# Patient Record
Sex: Female | Born: 1950 | Race: White | Hispanic: No | Marital: Married | Smoking: Former smoker
Health system: Southern US, Community
[De-identification: ages and names within clinical notes are randomized; demographics above are authoritative.]

## PROBLEM LIST (undated history)

## (undated) DIAGNOSIS — T4145XA Adverse effect of unspecified anesthetic, initial encounter: Secondary | ICD-10-CM

## (undated) DIAGNOSIS — M1990 Unspecified osteoarthritis, unspecified site: Secondary | ICD-10-CM

## (undated) DIAGNOSIS — G629 Polyneuropathy, unspecified: Secondary | ICD-10-CM

## (undated) DIAGNOSIS — N289 Disorder of kidney and ureter, unspecified: Secondary | ICD-10-CM

## (undated) DIAGNOSIS — K219 Gastro-esophageal reflux disease without esophagitis: Secondary | ICD-10-CM

## (undated) DIAGNOSIS — E039 Hypothyroidism, unspecified: Secondary | ICD-10-CM

## (undated) DIAGNOSIS — I10 Essential (primary) hypertension: Secondary | ICD-10-CM

## (undated) DIAGNOSIS — I509 Heart failure, unspecified: Secondary | ICD-10-CM

## (undated) DIAGNOSIS — G4730 Sleep apnea, unspecified: Secondary | ICD-10-CM

## (undated) DIAGNOSIS — K7460 Unspecified cirrhosis of liver: Secondary | ICD-10-CM

## (undated) DIAGNOSIS — D649 Anemia, unspecified: Secondary | ICD-10-CM

## (undated) DIAGNOSIS — K769 Liver disease, unspecified: Secondary | ICD-10-CM

## (undated) DIAGNOSIS — A0472 Enterocolitis due to Clostridium difficile, not specified as recurrent: Secondary | ICD-10-CM

## (undated) DIAGNOSIS — T8859XA Other complications of anesthesia, initial encounter: Secondary | ICD-10-CM

## (undated) DIAGNOSIS — Z87442 Personal history of urinary calculi: Secondary | ICD-10-CM

## (undated) DIAGNOSIS — R112 Nausea with vomiting, unspecified: Secondary | ICD-10-CM

## (undated) DIAGNOSIS — E7800 Pure hypercholesterolemia, unspecified: Secondary | ICD-10-CM

## (undated) DIAGNOSIS — C801 Malignant (primary) neoplasm, unspecified: Secondary | ICD-10-CM

## (undated) DIAGNOSIS — K589 Irritable bowel syndrome without diarrhea: Secondary | ICD-10-CM

## (undated) DIAGNOSIS — E119 Type 2 diabetes mellitus without complications: Secondary | ICD-10-CM

## (undated) DIAGNOSIS — Z9889 Other specified postprocedural states: Secondary | ICD-10-CM

## (undated) DIAGNOSIS — J449 Chronic obstructive pulmonary disease, unspecified: Secondary | ICD-10-CM

## (undated) DIAGNOSIS — E079 Disorder of thyroid, unspecified: Secondary | ICD-10-CM

## (undated) HISTORY — PX: CARPAL TUNNEL RELEASE: SHX101

## (undated) HISTORY — PX: CAROTID ARTERY - SUBCLAVIAN ARTERY BYPASS GRAFT: SUR178

## (undated) HISTORY — PX: ABDOMINAL HYSTERECTOMY: SHX81

## (undated) HISTORY — DX: Unspecified cirrhosis of liver: K74.60

## (undated) HISTORY — DX: Enterocolitis due to Clostridium difficile, not specified as recurrent: A04.72

## (undated) HISTORY — PX: OTHER SURGICAL HISTORY: SHX169

## (undated) HISTORY — DX: Gastro-esophageal reflux disease without esophagitis: K21.9

## (undated) HISTORY — DX: Disorder of thyroid, unspecified: E07.9

## (undated) HISTORY — PX: CHOLECYSTECTOMY: SHX55

## (undated) HISTORY — DX: Adverse effect of unspecified anesthetic, initial encounter: T41.45XA

## (undated) HISTORY — DX: Liver disease, unspecified: K76.9

## (undated) HISTORY — DX: Malignant (primary) neoplasm, unspecified: C80.1

## (undated) HISTORY — DX: Other complications of anesthesia, initial encounter: T88.59XA

## (undated) HISTORY — DX: Disorder of kidney and ureter, unspecified: N28.9

## (undated) HISTORY — PX: CATARACT EXTRACTION: SUR2

## (undated) HISTORY — PX: APPENDECTOMY: SHX54

## (undated) SURGERY — EGD (ESOPHAGOGASTRODUODENOSCOPY)
Anesthesia: Moderate Sedation

---

## 2014-02-15 ENCOUNTER — Encounter (HOSPITAL_COMMUNITY): Payer: Self-pay | Admitting: Emergency Medicine

## 2014-02-15 ENCOUNTER — Inpatient Hospital Stay (HOSPITAL_COMMUNITY)
Admission: EM | Admit: 2014-02-15 | Discharge: 2014-02-20 | DRG: 871 | Disposition: A | Discharge status: Home / Self Care | Payer: BC Managed Care – PPO | Attending: Family Medicine | Admitting: Family Medicine

## 2014-02-15 ENCOUNTER — Emergency Department (HOSPITAL_COMMUNITY): Payer: BC Managed Care – PPO

## 2014-02-15 DIAGNOSIS — K7460 Unspecified cirrhosis of liver: Secondary | ICD-10-CM

## 2014-02-15 DIAGNOSIS — D509 Iron deficiency anemia, unspecified: Secondary | ICD-10-CM | POA: Diagnosis present

## 2014-02-15 DIAGNOSIS — I10 Essential (primary) hypertension: Secondary | ICD-10-CM | POA: Diagnosis present

## 2014-02-15 DIAGNOSIS — K219 Gastro-esophageal reflux disease without esophagitis: Secondary | ICD-10-CM | POA: Diagnosis present

## 2014-02-15 DIAGNOSIS — I129 Hypertensive chronic kidney disease with stage 1 through stage 4 chronic kidney disease, or unspecified chronic kidney disease: Secondary | ICD-10-CM | POA: Diagnosis present

## 2014-02-15 DIAGNOSIS — E785 Hyperlipidemia, unspecified: Secondary | ICD-10-CM | POA: Diagnosis present

## 2014-02-15 DIAGNOSIS — D696 Thrombocytopenia, unspecified: Secondary | ICD-10-CM

## 2014-02-15 DIAGNOSIS — E7800 Pure hypercholesterolemia, unspecified: Secondary | ICD-10-CM | POA: Diagnosis present

## 2014-02-15 DIAGNOSIS — N189 Chronic kidney disease, unspecified: Secondary | ICD-10-CM | POA: Diagnosis present

## 2014-02-15 DIAGNOSIS — D6959 Other secondary thrombocytopenia: Secondary | ICD-10-CM | POA: Diagnosis present

## 2014-02-15 DIAGNOSIS — N390 Urinary tract infection, site not specified: Secondary | ICD-10-CM

## 2014-02-15 DIAGNOSIS — N184 Chronic kidney disease, stage 4 (severe): Secondary | ICD-10-CM | POA: Diagnosis present

## 2014-02-15 DIAGNOSIS — K317 Polyp of stomach and duodenum: Secondary | ICD-10-CM

## 2014-02-15 DIAGNOSIS — A419 Sepsis, unspecified organism: Principal | ICD-10-CM | POA: Diagnosis present

## 2014-02-15 DIAGNOSIS — A4152 Sepsis due to Pseudomonas: Secondary | ICD-10-CM

## 2014-02-15 DIAGNOSIS — K589 Irritable bowel syndrome without diarrhea: Secondary | ICD-10-CM | POA: Diagnosis present

## 2014-02-15 DIAGNOSIS — Z794 Long term (current) use of insulin: Secondary | ICD-10-CM

## 2014-02-15 DIAGNOSIS — E119 Type 2 diabetes mellitus without complications: Secondary | ICD-10-CM

## 2014-02-15 DIAGNOSIS — K649 Unspecified hemorrhoids: Secondary | ICD-10-CM | POA: Diagnosis present

## 2014-02-15 DIAGNOSIS — D732 Chronic congestive splenomegaly: Secondary | ICD-10-CM

## 2014-02-15 DIAGNOSIS — I509 Heart failure, unspecified: Secondary | ICD-10-CM | POA: Diagnosis present

## 2014-02-15 DIAGNOSIS — D649 Anemia, unspecified: Secondary | ICD-10-CM | POA: Diagnosis present

## 2014-02-15 DIAGNOSIS — D131 Benign neoplasm of stomach: Secondary | ICD-10-CM | POA: Diagnosis present

## 2014-02-15 DIAGNOSIS — R6521 Severe sepsis with septic shock: Secondary | ICD-10-CM

## 2014-02-15 DIAGNOSIS — Z87891 Personal history of nicotine dependence: Secondary | ICD-10-CM

## 2014-02-15 DIAGNOSIS — I8500 Esophageal varices without bleeding: Secondary | ICD-10-CM

## 2014-02-15 DIAGNOSIS — N183 Chronic kidney disease, stage 3 unspecified: Secondary | ICD-10-CM | POA: Diagnosis present

## 2014-02-15 DIAGNOSIS — Z6838 Body mass index (BMI) 38.0-38.9, adult: Secondary | ICD-10-CM

## 2014-02-15 DIAGNOSIS — R509 Fever, unspecified: Secondary | ICD-10-CM

## 2014-02-15 DIAGNOSIS — I959 Hypotension, unspecified: Secondary | ICD-10-CM

## 2014-02-15 DIAGNOSIS — I8510 Secondary esophageal varices without bleeding: Secondary | ICD-10-CM | POA: Diagnosis present

## 2014-02-15 DIAGNOSIS — R339 Retention of urine, unspecified: Secondary | ICD-10-CM | POA: Diagnosis present

## 2014-02-15 DIAGNOSIS — J4489 Other specified chronic obstructive pulmonary disease: Secondary | ICD-10-CM | POA: Diagnosis present

## 2014-02-15 DIAGNOSIS — N12 Tubulo-interstitial nephritis, not specified as acute or chronic: Secondary | ICD-10-CM | POA: Diagnosis present

## 2014-02-15 DIAGNOSIS — R6520 Severe sepsis without septic shock: Secondary | ICD-10-CM

## 2014-02-15 DIAGNOSIS — K621 Rectal polyp: Secondary | ICD-10-CM

## 2014-02-15 DIAGNOSIS — E1122 Type 2 diabetes mellitus with diabetic chronic kidney disease: Secondary | ICD-10-CM | POA: Diagnosis present

## 2014-02-15 DIAGNOSIS — K620 Anal polyp: Secondary | ICD-10-CM | POA: Diagnosis present

## 2014-02-15 DIAGNOSIS — J449 Chronic obstructive pulmonary disease, unspecified: Secondary | ICD-10-CM | POA: Diagnosis present

## 2014-02-15 DIAGNOSIS — G2581 Restless legs syndrome: Secondary | ICD-10-CM | POA: Diagnosis present

## 2014-02-15 HISTORY — DX: Heart failure, unspecified: I50.9

## 2014-02-15 HISTORY — DX: Irritable bowel syndrome, unspecified: K58.9

## 2014-02-15 HISTORY — DX: Essential (primary) hypertension: I10

## 2014-02-15 HISTORY — DX: Gastro-esophageal reflux disease without esophagitis: K21.9

## 2014-02-15 HISTORY — DX: Pure hypercholesterolemia, unspecified: E78.00

## 2014-02-15 HISTORY — DX: Chronic obstructive pulmonary disease, unspecified: J44.9

## 2014-02-15 HISTORY — DX: Type 2 diabetes mellitus without complications: E11.9

## 2014-02-15 LAB — URINALYSIS, ROUTINE W REFLEX MICROSCOPIC
BILIRUBIN URINE: NEGATIVE
Glucose, UA: NEGATIVE mg/dL
Ketones, ur: NEGATIVE mg/dL
NITRITE: NEGATIVE
PROTEIN: 100 mg/dL — AB
SPECIFIC GRAVITY, URINE: 1.015 (ref 1.005–1.030)
Urobilinogen, UA: 0.2 mg/dL (ref 0.0–1.0)
pH: 6 (ref 5.0–8.0)

## 2014-02-15 LAB — BASIC METABOLIC PANEL
BUN: 25 mg/dL — AB (ref 6–23)
CALCIUM: 8.9 mg/dL (ref 8.4–10.5)
CO2: 27 mEq/L (ref 19–32)
CREATININE: 1.49 mg/dL — AB (ref 0.50–1.10)
Chloride: 92 mEq/L — ABNORMAL LOW (ref 96–112)
GFR, EST AFRICAN AMERICAN: 42 mL/min — AB (ref >90)
GFR, EST NON AFRICAN AMERICAN: 36 mL/min — AB (ref >90)
GLUCOSE: 201 mg/dL — AB (ref 70–99)
Potassium: 4.5 mEq/L (ref 3.7–5.3)
Sodium: 134 mEq/L — ABNORMAL LOW (ref 137–147)

## 2014-02-15 LAB — CBC WITH DIFFERENTIAL/PLATELET
BASOS ABS: 0 10*3/uL (ref 0.0–0.1)
Basophils Relative: 0 % (ref 0–1)
EOS ABS: 0 10*3/uL (ref 0.0–0.7)
Eosinophils Relative: 0 % (ref 0–5)
HCT: 24.4 % — ABNORMAL LOW (ref 36.0–46.0)
Hemoglobin: 7.1 g/dL — ABNORMAL LOW (ref 12.0–15.0)
LYMPHS PCT: 2 % — AB (ref 12–46)
Lymphs Abs: 0.3 10*3/uL — ABNORMAL LOW (ref 0.7–4.0)
MCH: 20 pg — AB (ref 26.0–34.0)
MCHC: 29.1 g/dL — AB (ref 30.0–36.0)
MCV: 68.7 fL — ABNORMAL LOW (ref 78.0–100.0)
Monocytes Absolute: 0.8 10*3/uL (ref 0.1–1.0)
Monocytes Relative: 6 % (ref 3–12)
NEUTROS PCT: 92 % — AB (ref 43–77)
Neutro Abs: 12.9 10*3/uL — ABNORMAL HIGH (ref 1.7–7.7)
PLATELETS: 118 10*3/uL — AB (ref 150–400)
RBC: 3.55 MIL/uL — ABNORMAL LOW (ref 3.87–5.11)
RDW: 18.2 % — AB (ref 11.5–15.5)
WBC: 14 10*3/uL — ABNORMAL HIGH (ref 4.0–10.5)

## 2014-02-15 LAB — TROPONIN I: Troponin I: <0.3 ng/mL (ref <0.30)

## 2014-02-15 LAB — RETICULOCYTES
RBC.: 3.53 MIL/uL — AB (ref 3.87–5.11)
RETIC COUNT ABSOLUTE: 109.4 10*3/uL (ref 19.0–186.0)
Retic Ct Pct: 3.1 % (ref 0.4–3.1)

## 2014-02-15 LAB — URINE MICROSCOPIC-ADD ON

## 2014-02-15 LAB — PREPARE RBC (CROSSMATCH)

## 2014-02-15 LAB — ABO/RH: ABO/RH(D): O POS

## 2014-02-15 LAB — PRO B NATRIURETIC PEPTIDE: Pro B Natriuretic peptide (BNP): 277.5 pg/mL — ABNORMAL HIGH (ref 0–125)

## 2014-02-15 LAB — GLUCOSE, CAPILLARY: Glucose-Capillary: 210 mg/dL — ABNORMAL HIGH (ref 70–99)

## 2014-02-15 LAB — LACTIC ACID, PLASMA: Lactic Acid, Venous: 3.9 mmol/L — ABNORMAL HIGH (ref 0.5–2.2)

## 2014-02-15 LAB — MRSA PCR SCREENING: MRSA BY PCR: NEGATIVE

## 2014-02-15 MED ORDER — INSULIN ASPART 100 UNIT/ML ~~LOC~~ SOLN
0.0000 [IU] | Freq: Three times a day (TID) | SUBCUTANEOUS | Status: DC
  Administered 2014-02-16 (×2): 7 [IU] via SUBCUTANEOUS
  Administered 2014-02-16: 11 [IU] via SUBCUTANEOUS
  Administered 2014-02-17: 3 [IU] via SUBCUTANEOUS
  Administered 2014-02-17: 7 [IU] via SUBCUTANEOUS
  Administered 2014-02-17 – 2014-02-18 (×3): 3 [IU] via SUBCUTANEOUS
  Administered 2014-02-18: 4 [IU] via SUBCUTANEOUS

## 2014-02-15 MED ORDER — DEXTROSE 5 % IV SOLN
INTRAVENOUS | Status: AC
  Filled 2014-02-15: qty 10

## 2014-02-15 MED ORDER — INSULIN GLARGINE 100 UNIT/ML ~~LOC~~ SOLN
90.0000 [IU] | Freq: Every day | SUBCUTANEOUS | Status: DC
  Administered 2014-02-15 – 2014-02-19 (×5): 90 [IU] via SUBCUTANEOUS
  Filled 2014-02-15 (×5): qty 0.9

## 2014-02-15 MED ORDER — SODIUM CHLORIDE 0.9 % IV SOLN: INTRAVENOUS | Status: AC

## 2014-02-15 MED ORDER — DEXTROSE 5 % IV SOLN
1.0000 g | Freq: Once | INTRAVENOUS | Status: AC
  Administered 2014-02-15: 1 g via INTRAVENOUS
  Filled 2014-02-15: qty 10

## 2014-02-15 MED ORDER — HEPARIN SODIUM (PORCINE) 5000 UNIT/ML IJ SOLN
5000.0000 [IU] | Freq: Three times a day (TID) | INTRAMUSCULAR | Status: DC
  Administered 2014-02-15 – 2014-02-18 (×8): 5000 [IU] via SUBCUTANEOUS
  Filled 2014-02-15 (×8): qty 1

## 2014-02-15 MED ORDER — ONDANSETRON HCL 4 MG PO TABS: 4.0000 mg | ORAL_TABLET | Freq: Four times a day (QID) | ORAL | Status: DC | PRN

## 2014-02-15 MED ORDER — DEXTROSE 5 % IV SOLN
1.0000 g | INTRAVENOUS | Status: DC
  Filled 2014-02-15 (×3): qty 10

## 2014-02-15 MED ORDER — SODIUM CHLORIDE 0.9 % IV SOLN
INTRAVENOUS | Status: DC
  Administered 2014-02-15 – 2014-02-16 (×2): 1000 mL via INTRAVENOUS

## 2014-02-15 MED ORDER — DEXTROSE 5 % IV SOLN
1.0000 g | INTRAVENOUS | Status: DC
  Filled 2014-02-15: qty 10

## 2014-02-15 MED ORDER — SODIUM CHLORIDE 0.9 % IV BOLUS (SEPSIS)
1000.0000 mL | Freq: Once | INTRAVENOUS | Status: AC
  Administered 2014-02-15: 1000 mL via INTRAVENOUS

## 2014-02-15 MED ORDER — SODIUM CHLORIDE 0.9 % IV BOLUS (SEPSIS)
500.0000 mL | Freq: Once | INTRAVENOUS | Status: AC
  Administered 2014-02-15: 500 mL via INTRAVENOUS

## 2014-02-15 MED ORDER — ONDANSETRON HCL 4 MG/2ML IJ SOLN
4.0000 mg | Freq: Four times a day (QID) | INTRAMUSCULAR | Status: DC | PRN
  Administered 2014-02-19: 4 mg via INTRAVENOUS
  Filled 2014-02-15: qty 2

## 2014-02-15 MED ORDER — SODIUM CHLORIDE 0.9 % IV SOLN
INTRAVENOUS | Status: DC
  Administered 2014-02-15: 18:00:00 via INTRAVENOUS

## 2014-02-15 MED ORDER — INSULIN ASPART 100 UNIT/ML ~~LOC~~ SOLN
0.0000 [IU] | Freq: Every day | SUBCUTANEOUS | Status: DC
  Administered 2014-02-15 – 2014-02-17 (×2): 2 [IU] via SUBCUTANEOUS

## 2014-02-15 NOTE — ED Notes (Signed)
Penny Martinez, daughter, [phone] (can be reached anytime)

## 2014-02-15 NOTE — ED Provider Notes (Signed)
CSN: [id number]     Arrival date & time 02/15/14  61 History   First MD Initiated Contact with Patient 02/15/14 1713     Chief Complaint  Patient presents with  . Fever      HPI Pt was seen at 1745.  Per EMS and pt report, c/o gradual onset and persistence of constant generalized weakness/fatigue, subjective home fevers/chills, cough, SOB, and suprapubic and lower back discomfort with urination for the past 2 weeks, worse over the past several days. Pt was evaluated by her PMD PTA, then sent to the ED for further evaluation of fever and "low blood pressure." EMS gave APAP en route to the ED. Pt denies CP/palpitations, no abd pain, no N/V/D, no flank pain, no rash.    Past Medical History  Diagnosis Date  . Diabetes mellitus without complication   . COPD (chronic obstructive pulmonary disease)   . Hypertension   . Hypercholesteremia   . Acid reflux   . IBS (irritable bowel syndrome)   . CHF (congestive heart failure)    Past Surgical History  Procedure Laterality Date  . 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 Abdominal hysterectomy    . Cholecystectomy    . Appendectomy      History  Substance Use Topics  . Smoking status: Former Research scientist (life sciences)  . Smokeless tobacco: Not on file  . Alcohol Use: No    Review of Systems ROS: Statement: All systems negative except as marked or noted in the HPI; Constitutional: +fever and chills, generalized body weakness/fatigue. ; ; Eyes: Negative for eye pain, redness and discharge. ; ; ENMT: Negative for ear pain, hoarseness, nasal congestion, sinus pressure and sore throat. ; ; Cardiovascular: Negative for chest pain, palpitations, diaphoresis, and peripheral edema. ; ; Respiratory: +cough, SOB. Negative for wheezing and stridor. ; ; Gastrointestinal: Negative for nausea, vomiting, diarrhea, abdominal pain, blood in stool, hematemesis, jaundice and rectal bleeding. . ; ; Genitourinary: +dysuria. Negative for flank pain and hematuria. ; ;  Musculoskeletal: Negative for neck pain. Negative for swelling and trauma.; ; Skin: Negative for pruritus, rash, abrasions, blisters, bruising and skin lesion.; ; Neuro: Negative for headache, lightheadedness and neck stiffness. Negative for altered level of consciousness , altered mental status, extremity weakness, paresthesias, involuntary movement, seizure and syncope.      Allergies  Erythromycin; Penicillins; and Sulfa antibiotics  Home Medications   Prior to Admission medications   Not on File   BP 82/50  Pulse 105  Temp(Src) 103 F (39.4 C) (Oral)  Resp 16  Ht 5' 1"  (1.549 m)  Wt 200 lb (90.719 kg)  BMI 37.81 kg/m2  SpO2 95% Filed Vitals:   02/15/14 1830 02/15/14 1834 02/15/14 1847 02/15/14 1900  BP: 87/52  98/44 91/55  Pulse: 101  100 100  Temp:  101.7 F (38.7 C)    TempSrc:  Oral    Resp: 16  22 23   Height:      Weight:      SpO2: 94%  94% 95%    Physical Exam 1750: Physical examination:  Nursing notes reviewed; Vital signs and O2 SAT reviewed; +febrile.;; Constitutional: Well developed, Well nourished, In no acute distress; Head:  Normocephalic, atraumatic; Eyes: EOMI, PERRL, No scleral icterus; ENMT: Mouth and pharynx normal, Mucous membranes dry; Neck: Supple, Full range of motion, No lymphadenopathy. No meningeal signs.; Cardiovascular: Tachycardic rate and rhythm, No gallop; Respiratory: Breath sounds coarse & equal bilaterally, No wheezes.  Speaking full sentences with ease,  Normal respiratory effort/excursion; Chest: Nontender, Movement normal; Abdomen: Soft, Nontender, Nondistended, Normal bowel sounds; Genitourinary: No CVA tenderness; Extremities: Pulses normal, No tenderness, No edema, No calf edema or asymmetry.; Neuro: AA&Ox3, Major CN grossly intact.  Speech clear. No gross focal motor or sensory deficits in extremities.; Skin: Color normal, Warm, Dry.   ED Course  Procedures     EKG Interpretation   Date/Time:  Thursday February 15 2014 17:44:31  EDT Ventricular Rate:  106 PR Interval:  141 QRS Duration: 134 QT Interval:  354 QTC Calculation: 470 R Axis:   -112 Text Interpretation:  Sinus tachycardia Left axis deviation Right bundle  branch block Probable lateral infarct, old Baseline wander No old tracing  to compare Confirmed by [facility]  MD, Nunzio Cory [phone]) on 02/15/2014  5:58:58 PM      MDM  MDM Reviewed: nursing note and vitals Interpretation: labs, ECG and x-ray Total time providing critical care: 30-74 minutes. This excludes time spent performing separately reportable procedures and services. Consults: admitting MD    CRITICAL CARE Performed by: Alfonzo Feller Total critical care time: 40 Critical care time was exclusive of separately billable procedures and treating other patients. Critical care was necessary to treat or prevent imminent or life-threatening deterioration. Critical care was time spent personally by me on the following activities: development of treatment plan with patient and/or surrogate as well as nursing, discussions with consultants, evaluation of patient's response to treatment, examination of patient, obtaining history from patient or surrogate, ordering and performing treatments and interventions, ordering and review of laboratory studies, ordering and review of radiographic studies, pulse oximetry and re-evaluation of patient's condition.   Results for orders placed during the hospital encounter of 02/15/14  URINALYSIS, ROUTINE W REFLEX MICROSCOPIC      Result Value Ref Range   Color, Urine YELLOW  YELLOW   APPearance CLOUDY (*) CLEAR   Specific Gravity, Urine 1.015  1.005 - 1.030   pH 6.0  5.0 - 8.0   Glucose, UA NEGATIVE  NEGATIVE mg/dL   Hgb urine dipstick MODERATE (*) NEGATIVE   Bilirubin Urine NEGATIVE  NEGATIVE   Ketones, ur NEGATIVE  NEGATIVE mg/dL   Protein, ur 100 (*) NEGATIVE mg/dL   Urobilinogen, UA 0.2  0.0 - 1.0 mg/dL   Nitrite NEGATIVE  NEGATIVE   Leukocytes, UA  SMALL (*) NEGATIVE  BASIC METABOLIC PANEL      Result Value Ref Range   Sodium 134 (*) 137 - 147 mEq/L   Potassium 4.5  3.7 - 5.3 mEq/L   Chloride 92 (*) 96 - 112 mEq/L   CO2 27  19 - 32 mEq/L   Glucose, Bld 201 (*) 70 - 99 mg/dL   BUN 25 (*) 6 - 23 mg/dL   Creatinine, Ser 1.49 (*) 0.50 - 1.10 mg/dL   Calcium 8.9  8.4 - 10.5 mg/dL   GFR calc non Af Amer 36 (*) >90 mL/min   GFR calc Af Amer 42 (*) >90 mL/min  CBC WITH DIFFERENTIAL      Result Value Ref Range   WBC 14.0 (*) 4.0 - 10.5 K/uL   RBC 3.55 (*) 3.87 - 5.11 MIL/uL   Hemoglobin 7.1 (*) 12.0 - 15.0 g/dL   HCT 24.4 (*) 36.0 - 46.0 %   MCV 68.7 (*) 78.0 - 100.0 fL   MCH 20.0 (*) 26.0 - 34.0 pg   MCHC 29.1 (*) 30.0 - 36.0 g/dL   RDW 18.2 (*) 11.5 - 15.5 %   Platelets 118 (*) 150 -  400 K/uL   Neutrophils Relative % 92 (*) 43 - 77 %   Lymphocytes Relative 2 (*) 12 - 46 %   Monocytes Relative 6  3 - 12 %   Eosinophils Relative 0  0 - 5 %   Basophils Relative 0  0 - 1 %   Neutro Abs 12.9 (*) 1.7 - 7.7 K/uL   Lymphs Abs 0.3 (*) 0.7 - 4.0 K/uL   Monocytes Absolute 0.8  0.1 - 1.0 K/uL   Eosinophils Absolute 0.0  0.0 - 0.7 K/uL   Basophils Absolute 0.0  0.0 - 0.1 K/uL   RBC Morphology POLYCHROMASIA PRESENT     WBC Morphology WHITE COUNT CONFIRMED ON SMEAR     Smear Review PLATELET COUNT CONFIRMED BY SMEAR    LACTIC ACID, PLASMA      Result Value Ref Range   Lactic Acid, Venous 3.9 (*) 0.5 - 2.2 mmol/L  TROPONIN I      Result Value Ref Range   Troponin I <0.30  <0.30 ng/mL  URINE MICROSCOPIC-ADD ON      Result Value Ref Range   Squamous Epithelial / LPF FEW (*) RARE   WBC, UA 21-50  <3 WBC/hpf   RBC / HPF 3-6  <3 RBC/hpf   Bacteria, UA MANY (*) RARE   Dg Chest Port 1 View 02/15/2014   CLINICAL DATA:  Fever. Weakness. Vomiting. Diarrhea. Chest discomfort.  EXAM: PORTABLE CHEST - 1 VIEW  COMPARISON:  None.  FINDINGS: The cardiopericardial silhouette is enlarged. Pulmonary vascular congestion is present with cephalization of  pulmonary blood flow. Basilar atelectasis. Evaluation of the bases is suboptimal due to body habitus and underpenetration from portable technique. No pleural effusions. No gross consolidation.  IMPRESSION: Cardiomegaly and pulmonary vascular congestion. No focal consolidation.   Electronically Signed   By: Dereck Ligas M.D.   On: 02/15/2014 17:45    2000:  SBP dropped to 78 on arrival. IVF bolus given with SBP increasing to 90. SBP again dropping to 80's; 2nd IV saline lock placed and 2nd IVF bolus ordered with SBP increasing to 98. Fever and tachycardia improving after APAP. Mentating per her baseline. +UTI, UC and BC x2 pending; will dose IV rocephin. Dx and testing d/w pt and family.  Questions answered.  Verb understanding, agreeable to admit.   T/C to [facility] Dr. Anastasio Champion, case discussed, including:  HPI, pertinent PM/SHx, VS/PE, dx testing, ED course and treatment:  Agreeable to admit, requests to write temporary orders, obtain stepdown bed.    Alfonzo Feller, DO 02/16/14 1827

## 2014-02-15 NOTE — H&P (Addendum)
[facility] History and Physical  Penny Martinez XYV:[id number] DOB: 1950/10/01 DOA: 02/15/2014  Referring physician: ER. PCP: No primary provider on file.   Chief Complaint: Fever, fatigue.  HPI: Penny Martinez is a 63 y.o. female  This is a 63 year old lady who has diabetes who presents with fever and fatigue for the last 2 weeks. She has had lower back pain and suprapubic discomfort also. She has been nauseous. She also complains of some dyspnea. She does have a previous history of congestive heart failure. In the emergency room she was found to be hypotensive and initial evaluation shows urinalysis consistent with UTI. She also has abnormal blood work with a microcytic anemia. She is now being admitted for further investigation and management.   Review of Systems:  Constitutional:  No weight loss HEENT:  No headaches, Difficulty swallowing,Tooth/dental problems,Sore throat,  No sneezing, itching, ear ache, nasal congestion, post nasal drip,  Cardio-vascular:  No chest pain, Orthopnea, PND, swelling in lower extremities, anasarca, dizziness, palpitations   Resp:   No excess mucus, no productive cough, No non-productive cough, No coughing up of blood.No change in color of mucus.No wheezing.No chest wall deformity  Skin:  no rash or lesions.  GU:  no dysuria, change in color of urine, no urgency or frequency. No flank pain.  Musculoskeletal:  No joint pain or swelling. No decreased range of motion. No back pain.  Psych:  No change in mood or affect. No depression or anxiety. No memory loss.   Past Medical History  Diagnosis Date  . Diabetes mellitus without complication   . COPD (chronic obstructive pulmonary disease)   . Hypertension   . Hypercholesteremia   . Acid reflux   . IBS (irritable bowel syndrome)   . CHF (congestive heart failure)    Past Surgical History  Procedure Laterality Date  . 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 Abdominal  hysterectomy    . Cholecystectomy    . Appendectomy     Social History:  reports that she has quit smoking. She does not have any smokeless tobacco history on file. She reports that she does not drink alcohol or use illicit drugs.  Allergies  Allergen Reactions  . Erythromycin   . Penicillins   . Sulfa Antibiotics     No family history on file.   Prior to Admission medications   Medication Sig Start Date End Date Taking? Authorizing Provider  insulin aspart (NOVOLOG) 100 UNIT/ML injection Inject 30-43 Units into the skin 3 (three) times daily before meals.   Yes Historical Provider, MD  insulin glargine (LANTUS) 100 UNIT/ML injection Inject 90 Units into the skin at bedtime.   Yes Historical Provider, MD  Liraglutide (VICTOZA) 18 MG/3ML SOPN Inject into the skin.   Yes Historical Provider, MD   Physical Exam: Filed Vitals:   02/15/14 2030  BP: 84/44  Pulse: 99  Temp:   Resp: 10    BP 84/44  Pulse 99  Temp(Src) 100.3 F (37.9 C) (Oral)  Resp 10  Ht 5' 1"  (1.549 m)  Wt 90.719 kg (200 lb)  BMI 37.81 kg/m2  SpO2 92%  General:  Appears calm and comfortable. She does not look toxic or septic but she is hypotensive. Eyes: PERRL, normal lids, irises & conjunctiva ENT: grossly normal hearing, lips & tongue Neck: no LAD, masses or thyromegaly Cardiovascular: RRR, no m/r/g. No LE edema. Telemetry: SR, no arrhythmias  Respiratory: CTA bilaterally, no w/r/r. Normal respiratory effort. Abdomen: soft, ntnd.  There is a suggestion of a mass in the right upper quadrant but I'm not quite certain of this. Skin: no rash or induration seen on limited exam Musculoskeletal: grossly normal tone BUE/BLE Psychiatric: grossly normal mood and affect, speech fluent and appropriate Neurologic: grossly non-focal.          Labs on Admission:  Basic Metabolic Panel:  Recent Labs Lab 02/15/14 1725  NA 134*  K 4.5  CL 92*  CO2 27  GLUCOSE 201*  BUN 25*  CREATININE 1.49*  CALCIUM 8.9         CBC:  Recent Labs Lab 02/15/14 1725  WBC 14.0*  NEUTROABS 12.9*  HGB 7.1*  HCT 24.4*  MCV 68.7*  PLT 118*   Cardiac Enzymes:  Recent Labs Lab 02/15/14 1725  TROPONINI <0.30    BNP (last 3 results)  Recent Labs  02/15/14 1726  PROBNP 277.5*   CBG: No results found for this basename: GLUCAP,  in the last 168 hours  Radiological Exams on Admission: Dg Chest Port 1 View  02/15/2014   CLINICAL DATA:  Fever. Weakness. Vomiting. Diarrhea. Chest discomfort.  EXAM: PORTABLE CHEST - 1 VIEW  COMPARISON:  None.  FINDINGS: The cardiopericardial silhouette is enlarged. Pulmonary vascular congestion is present with cephalization of pulmonary blood flow. Basilar atelectasis. Evaluation of the bases is suboptimal due to body habitus and underpenetration from portable technique. No pleural effusions. No gross consolidation.  IMPRESSION: Cardiomegaly and pulmonary vascular congestion. No focal consolidation.   Electronically Signed   By: Dereck Ligas M.D.   On: 02/15/2014 17:45    EKG: Independently reviewed. Sinus tachycardia. Left axis deviation, right bundle branch block.  Assessment/Plan   1. Sepsis, likely secondary to UTI. 2. Hypotension secondary to #1. 3. Microcytic anemia, unclear etiology. She most likely has iron deficiency anemia, raising the possibility of occult GI blood loss. 4. Chronic kidney disease, likely secondary to a combination of hypertension and diabetes. 5. History of congestive heart failure. She is currently compensated. 6. Type 2 diabetes mellitus. 7. Obesity.  Plan: 1. Admit to step down unit. 2. Intravenous fluids and fluid bolus as required. Watch for the development of fluid overload and congestive heart failure in view of the history. 3. Intravenous antibiotics. 4. Anemia panel. 5. Give 2 units blood transfusion for symptomatic relief. 6. Gastroenterology consultation in view of the possible iron deficiency anemia and GI blood  loss. 7. Control diabetes. A. CT scan of the abdomen for further evaluation of possible mass in the right upper quadrant/fullness.  Further recommendations will depend on patient's hospital progress.   Code Status: Full code.  Family Communication: I discussed the plan with the patient at the bedside.   Disposition Plan: Home when medically stable.  Time spent: 60 minutes.  Doree Albee [facility] Pager [phone].  **Disclaimer: This note may have been dictated with voice recognition software. Similar sounding words can inadvertently be transcribed and this note may contain transcription errors which may not have been corrected upon publication of note.**

## 2014-02-15 NOTE — ED Notes (Signed)
Per EMS - cough x 2 wks, fever starting last night with increased fatigue and weakness.  EMS gave tylenol 1075m in route.  Also c/o bil lower back pain and sob with activity.

## 2014-02-16 ENCOUNTER — Observation Stay (HOSPITAL_COMMUNITY): Payer: BC Managed Care – PPO

## 2014-02-16 LAB — CBC
HCT: 27.5 % — ABNORMAL LOW (ref 36.0–46.0)
HEMOGLOBIN: 8.4 g/dL — AB (ref 12.0–15.0)
MCH: 22.2 pg — ABNORMAL LOW (ref 26.0–34.0)
MCHC: 30.5 g/dL (ref 30.0–36.0)
MCV: 72.8 fL — ABNORMAL LOW (ref 78.0–100.0)
PLATELETS: 106 10*3/uL — AB (ref 150–400)
RBC: 3.78 MIL/uL — ABNORMAL LOW (ref 3.87–5.11)
RDW: 20.8 % — ABNORMAL HIGH (ref 11.5–15.5)
WBC: 16.3 10*3/uL — ABNORMAL HIGH (ref 4.0–10.5)

## 2014-02-16 LAB — COMPREHENSIVE METABOLIC PANEL
ALT: 15 U/L (ref 0–35)
AST: 28 U/L (ref 0–37)
Albumin: 2.9 g/dL — ABNORMAL LOW (ref 3.5–5.2)
Alkaline Phosphatase: 90 U/L (ref 39–117)
BUN: 32 mg/dL — AB (ref 6–23)
CALCIUM: 8.2 mg/dL — AB (ref 8.4–10.5)
CO2: 25 mEq/L (ref 19–32)
Chloride: 93 mEq/L — ABNORMAL LOW (ref 96–112)
Creatinine, Ser: 1.62 mg/dL — ABNORMAL HIGH (ref 0.50–1.10)
GFR, EST AFRICAN AMERICAN: 38 mL/min — AB (ref >90)
GFR, EST NON AFRICAN AMERICAN: 33 mL/min — AB (ref >90)
GLUCOSE: 232 mg/dL — AB (ref 70–99)
Potassium: 4.1 mEq/L (ref 3.7–5.3)
Sodium: 132 mEq/L — ABNORMAL LOW (ref 137–147)
Total Bilirubin: 0.9 mg/dL (ref 0.3–1.2)
Total Protein: 6.3 g/dL (ref 6.0–8.3)

## 2014-02-16 LAB — IRON AND TIBC
Iron: 19 ug/dL — ABNORMAL LOW (ref 42–135)
Saturation Ratios: 5 % — ABNORMAL LOW (ref 20–55)
TIBC: 410 ug/dL (ref 250–470)
UIBC: 391 ug/dL (ref 125–400)

## 2014-02-16 LAB — GLUCOSE, CAPILLARY
GLUCOSE-CAPILLARY: 226 mg/dL — AB (ref 70–99)
GLUCOSE-CAPILLARY: 210 mg/dL — AB (ref 70–99)
Glucose-Capillary: 257 mg/dL — ABNORMAL HIGH (ref 70–99)
Glucose-Capillary: 186 mg/dL — ABNORMAL HIGH (ref 70–99)

## 2014-02-16 LAB — FERRITIN: Ferritin: 4 ng/mL — ABNORMAL LOW (ref 10–291)

## 2014-02-16 LAB — HEMOGLOBIN A1C
HEMOGLOBIN A1C: 7.9 % — AB (ref <5.7)
Mean Plasma Glucose: 180 mg/dL — ABNORMAL HIGH (ref <117)

## 2014-02-16 LAB — VITAMIN B12: VITAMIN B 12: 468 pg/mL (ref 211–911)

## 2014-02-16 LAB — TSH: TSH: 1.27 u[IU]/mL (ref 0.350–4.500)

## 2014-02-16 LAB — FOLATE

## 2014-02-16 MED ORDER — IOHEXOL 300 MG/ML  SOLN
50.0000 mL | Freq: Once | INTRAMUSCULAR | Status: AC | PRN
  Administered 2014-02-16: 50 mL via ORAL

## 2014-02-16 MED ORDER — KETOROLAC TROMETHAMINE 15 MG/ML IJ SOLN
15.0000 mg | Freq: Once | INTRAMUSCULAR | Status: AC
  Administered 2014-02-16: 15 mg via INTRAVENOUS
  Filled 2014-02-16: qty 1

## 2014-02-16 MED ORDER — GABAPENTIN 300 MG PO CAPS
300.0000 mg | ORAL_CAPSULE | Freq: Three times a day (TID) | ORAL | Status: DC
  Administered 2014-02-16 – 2014-02-20 (×10): 300 mg via ORAL
  Filled 2014-02-16 (×10): qty 1

## 2014-02-16 MED ORDER — DM-GUAIFENESIN ER 30-600 MG PO TB12: 1.0000 | ORAL_TABLET | Freq: Two times a day (BID) | ORAL | Status: DC | PRN

## 2014-02-16 MED ORDER — SODIUM CHLORIDE 0.9 % IJ SOLN
INTRAMUSCULAR | Status: AC
  Administered 2014-02-16: 09:00:00
  Filled 2014-02-16: qty 500

## 2014-02-16 MED ORDER — GUAIFENESIN-DM 100-10 MG/5ML PO SYRP
5.0000 mL | ORAL_SOLUTION | ORAL | Status: DC | PRN
  Administered 2014-02-16 – 2014-02-19 (×5): 5 mL via ORAL
  Filled 2014-02-16 (×5): qty 5

## 2014-02-16 MED ORDER — IOHEXOL 300 MG/ML  SOLN
80.0000 mL | Freq: Once | INTRAMUSCULAR | Status: AC | PRN
  Administered 2014-02-16: 80 mL via INTRAVENOUS

## 2014-02-16 MED ORDER — DEXTROSE 5 % IV SOLN
2.0000 g | Freq: Two times a day (BID) | INTRAVENOUS | Status: DC
  Administered 2014-02-16 – 2014-02-18 (×4): 2 g via INTRAVENOUS
  Filled 2014-02-16 (×7): qty 2

## 2014-02-16 MED ORDER — PANTOPRAZOLE SODIUM 40 MG PO TBEC
40.0000 mg | DELAYED_RELEASE_TABLET | Freq: Every day | ORAL | Status: DC
  Administered 2014-02-16 – 2014-02-20 (×5): 40 mg via ORAL
  Filled 2014-02-16 (×5): qty 1

## 2014-02-16 NOTE — Care Management Note (Addendum)
    Page 1 of 1   02/20/2014     9:44:55 AM CARE MANAGEMENT NOTE 02/20/2014  Patient:  Penny Martinez, Penny Martinez   Account Number:  [id number]  Date Initiated:  02/16/2014  Documentation initiated by:  Theophilus Kinds  Subjective/Objective Assessment:   Pt admitted from home with UTi and sepsis. Pt lives with her husband and will return home at discharge. Pt is independent with ADL's.     Action/Plan:   No CM needs noted.   Anticipated DC Date:  02/19/2014   Anticipated DC Plan:  [place]  CM consult      Choice offered to / List presented to:             Status of service:  Completed, signed off Medicare Important Message given?   (If response is "NO", the following Medicare IM given date fields will be blank) Date Medicare IM given:   Date Additional Medicare IM given:    Discharge Disposition:  HOME/SELF CARE  Per UR Regulation:    If discussed at Long Length of Stay Meetings, dates discussed:    Comments:  02/20/14 0940 Christinia Gully, RN BSN CM Pt discharged home today. No CM needs noted.  02/19/14 [place], RN BSN CM Per pt, PCP is Dr. Alford Highland of [facility]  02/16/14 [place], RN BSN CM

## 2014-02-16 NOTE — Progress Notes (Signed)
ANTIBIOTIC CONSULT NOTE - INITIAL  Pharmacy Consult for Ceftazidime Indication: bactermia  Allergies  Allergen Reactions  . Erythromycin   . Penicillins   . Sulfa Antibiotics     Patient Measurements: Height: 5' 1"  (154.9 cm) Weight: 205 lb 11 oz (93.3 kg) IBW/kg (Calculated) : 47.8 Adjusted Body Weight:   Vital Signs: Temp: 98.9 F (37.2 C) (06/12 1200) Temp src: Oral (06/12 1200) BP: 91/60 mmHg (06/12 1530) Pulse Rate: 77 (06/12 1530) Intake/Output from previous day: 06/11 0701 - 06/12 0700 In: 1961.7 [I.V.:1261.7; Blood:700] Out: 100 [Urine:100] Intake/Output from this shift:    Labs:  Recent Labs  02/15/14 1725 02/16/14 0700  WBC 14.0* 16.3*  HGB 7.1* 8.4*  PLT 118* 106*  CREATININE 1.49* 1.62*   Estimated Creatinine Clearance: 37.5 ml/min (by C-G formula based on Cr of 1.62). No results found for this basename: VANCOTROUGH, Corlis Leak, VANCORANDOM, GENTTROUGH, GENTPEAK, GENTRANDOM, TOBRATROUGH, TOBRAPEAK, TOBRARND, AMIKACINPEAK, AMIKACINTROU, AMIKACIN,  in the last 72 hours   Microbiology: Recent Results (from the past 720 hour(s))  CULTURE, BLOOD (ROUTINE X 2)     Status: None   Collection Time    02/15/14  7:00 PM      Result Value Ref Range Status   Specimen Description BLOOD RIGHT HAND   Final   Special Requests     Final   Value: BOTTLES DRAWN AEROBIC AND ANAEROBIC AEB=12CC ANA=8CC   Culture     Final   Value: GRAM NEGATIVE RODS     Gram Stain Report Called to,Read Back By and Verified With: NADINE ROWE RN ON [id number] AT 0820 BY RESSEGGER R   Report Status PENDING   Incomplete  MRSA PCR SCREENING     Status: None   Collection Time    02/15/14  9:10 PM      Result Value Ref Range Status   MRSA by PCR NEGATIVE  NEGATIVE Final   Comment:            The GeneXpert MRSA Assay (FDA     approved for NASAL specimens     only), is one component of a     comprehensive MRSA colonization     surveillance program. It is not     intended to diagnose MRSA      infection nor to guide or     monitor treatment for     MRSA infections.    Medical History: Past Medical History  Diagnosis Date  . Diabetes mellitus without complication   . COPD (chronic obstructive pulmonary disease)   . Hypertension   . Hypercholesteremia   . Acid reflux   . IBS (irritable bowel syndrome)   . CHF (congestive heart failure)     Medications:  Scheduled:  . sodium chloride   Intravenous STAT  . cefTAZidime (FORTAZ)  IV  2 g Intravenous Q12H  . heparin  5,000 Units Subcutaneous 3 times per day  . insulin aspart  0-20 Units Subcutaneous TID WC  . insulin aspart  0-5 Units Subcutaneous QHS  . insulin glargine  90 Units Subcutaneous QHS   Assessment: Gram Negative noted in blood Change from Ceftriaxone to Ceftazidime until sensitivity results  Goal of Therapy:  Eradicate infection  Plan:  Ceftazidime 2 GM IV every 12 hours Monitor renal function Labs per protocol F./U Cultures and sensitivity  Abner Greenspan, Erle Guster Bennett 02/16/2014,4:02 PM

## 2014-02-16 NOTE — Progress Notes (Signed)
GI consult called to Dr. Laural Golden (night shift placed MD on treatment team). Dr. Laural Golden stated that Dr. Gala Romney was on call today. Dr. Gala Romney paged around 2pm and [place] notified when rounding this evening at 2:30pm, stated that Dr. Gala Romney had several cases this afternoon. Dr. Gala Romney paged again at 3:50pm and added to treatment team.

## 2014-02-16 NOTE — Progress Notes (Signed)
eLink Physician-Brief Progress Note Patient Name: Penny Martinez DOB: September 24, 1950 MRN: [id number]  Date of Service  02/16/2014   HPI/Events of Note   ELINK monitoring BC Noted gram neg in blood Not on pressors  eICU Interventions  Change to ceftaz until ID and sens mnoted If declines, add quinololone   Intervention Category Intermediate Interventions: Infection - evaluation and management  FEINSTEIN,DANIEL J. 02/16/2014, 3:58 PM

## 2014-02-16 NOTE — Progress Notes (Signed)
UR chart review completed.  

## 2014-02-16 NOTE — Progress Notes (Signed)
Bladder scanned due to poor urinary output, 781cc noted on scanner, call to mid level MD for foley due to acute urinary retention.  Verified with 2nd RN of need for foley placement.  79 French catheter placed, patient tolerated well, clear yellow urine return.

## 2014-02-16 NOTE — Progress Notes (Addendum)
Note: This document was prepared with digital dictation and possible smart phrase technology. Any transcriptional errors that result from this process are unintentional.   Penny Martinez BMS:[id number] DOB: 12-20-1950 DOA: 02/15/2014 PCP: No primary provider on file.  Brief narrative: 63 y/o ?, known h/o DM ty 2 diagnosed before age 63 ,? 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 She states that she was well until [facility] when she went with her family to the beach and then started feeling ill. She had fevers and chills and a nonproductive cough and she felt was bronchitis. On 6/11 she felt extremely hot and went to her [address] office and was admitted because of hypotension and fever   She was also found to have microcytic anemia and was transfused 2 U prbc She has not had a history of colonoscopy in the past and has no dark stools or tarry stools. CT scan ordered to rule out abdominal issues pending Blood cultures preliminary grew gram-negative rods  Past medical history-As per Problem list Chart reviewed as below- And  Consultants:  Gastroenterology  Procedures:  None  Antibiotics:  Ceftriaxone 6/11   Subjective  A little less sleepy. Tolerating some diet Felt that her this morning and no head.  Denies Chest pain no nausea vomiting or shortness of breath  No blurred vision   Objective    Interim History:   Telemetry: Sinus rhythm   Objective: Filed Vitals:   02/16/14 0515 02/16/14 0530 02/16/14 0730 02/16/14 0800  BP: 83/48 82/41 91/60  92/55  Pulse: 94 92 86 88  Temp:  99.7 F (37.6 C)  98.5 F (36.9 C)  TempSrc:  Axillary  Oral  Resp: 18 18 12 14   Height:      Weight:      SpO2: 100% 100% 98% 98%    Intake/Output Summary (Last 24 hours) at 02/16/14 0852 Last data filed at 02/16/14  0500  Gross per 24 hour  Intake 1961.67 ml  Output    100 ml  Net 1861.67 ml    Exam:  General: Alert and oriented in no apparent distress Cardiovascular: S1-S2 no murmur rub or gallop Respiratory:  clear no added sounds Abdomen:  soft nontender nondistended no rebound Skin no lower extremity edema Neuro grossly intact  Data Reviewed: Basic Metabolic Panel:  Recent Labs Lab 02/15/14 1725 02/16/14 0700  NA 134* 132*  K 4.5 4.1  CL 92* 93*  CO2 27 25  GLUCOSE 201* 232*  BUN 25* 32*  CREATININE 1.49* 1.62*  CALCIUM 8.9 8.2*   Liver Function Tests:  Recent Labs Lab 02/16/14 0700  AST 28  ALT 15  ALKPHOS 90  BILITOT 0.9  PROT 6.3  ALBUMIN 2.9*   No results found for this basename: LIPASE, AMYLASE,  in the last 168 hours No results found for this basename: AMMONIA,  in the last 168 hours CBC:  Recent Labs Lab 02/15/14 1725 02/16/14 0700  WBC 14.0* 16.3*  NEUTROABS 12.9*  --   HGB 7.1* 8.4*  HCT 24.4* 27.5*  MCV 68.7* 72.8*  PLT 118* 106*   Cardiac Enzymes:  Recent Labs Lab 02/15/14 1725  TROPONINI <0.30   BNP: No components found with this basename: POCBNP,  CBG:  Recent Labs Lab 02/15/14 2120 02/16/14 0734  GLUCAP 210* 226*    Recent Results (from the past 240 hour(s))  CULTURE, BLOOD (  ROUTINE X 2)     Status: None   Collection Time    02/15/14  7:00 PM      Result Value Ref Range Status   Specimen Description BLOOD RIGHT HAND   Final   Special Requests     Final   Value: BOTTLES DRAWN AEROBIC AND ANAEROBIC AEB=12CC ANA=8CC   Culture     Final   Value: GRAM NEGATIVE RODS     Gram Stain Report Called to,Read Back By and Verified With: NADINE ROWE RN ON [id number] AT 0820 BY RESSEGGER R   Report Status PENDING   Incomplete  MRSA PCR SCREENING     Status: None   Collection Time    02/15/14  9:10 PM      Result Value Ref Range Status   MRSA by PCR NEGATIVE  NEGATIVE Final   Comment:            The GeneXpert MRSA Assay (FDA      approved for NASAL specimens     only), is one component of a     comprehensive MRSA colonization     surveillance program. It is not     intended to diagnose MRSA     infection nor to guide or     monitor treatment for     MRSA infections.     Studies:              All Imaging reviewed and is as per above notation   Scheduled Meds: . sodium chloride   Intravenous STAT  . cefTRIAXone (ROCEPHIN)  IV  1 g Intravenous Q24H  . heparin  5,000 Units Subcutaneous 3 times per day  . insulin aspart  0-20 Units Subcutaneous TID WC  . insulin aspart  0-5 Units Subcutaneous QHS  . insulin glargine  90 Units Subcutaneous QHS   Continuous Infusions: . sodium chloride 100 mL/hr at 02/16/14 0500     Assessment/Plan: 1. Sepsis likely secondary to pyelonephritis with gram-negative rods-continue ceftriaxone, continue IV fluids and 100 cc per hour, CBC plus differential in a.m.  Tylenol for fever 2. 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 Worsening could be secondary to sepsis. Monitor 3. Diabetes mellitus type 2-continue 90 and Lantus each bedtime, resistant sliding scale coverage.  Victoza on hold 4. Microcytic anemia-status post 2 units packed cell transfusion-hemoglobin acceptable went from 7.1-->8.4. Patient getting CT scan abdomen pelvis. Gastroenterology consulted for consideration of colonoscopy and I changed her to a clear liquid diet after breakfast this morning. Full anemia panel is pending 5. Congestive heart failure-no echocardiogram recorded in system, monitor. Is not on an ACE inhibitor or beta blocker for unclear reason 6. Thrombocytopenia-unclear etiology. Monitor could be secondary to sepsis 7. ? Mass right upper quadrant-CT scan pending Morbid obesity, Body mass index is 38.88 kg/(m^2). patient will require outpatient counseling regarding the same  Code Status: full Family Communication:  None bedside Disposition Plan:  Inpatient-keep on sdu   Verneita Griffes,  MD  [facility] Pager [phone] 02/16/2014, 8:52 AM    LOS: 1 day

## 2014-02-17 DIAGNOSIS — D696 Thrombocytopenia, unspecified: Secondary | ICD-10-CM

## 2014-02-17 DIAGNOSIS — K7460 Unspecified cirrhosis of liver: Secondary | ICD-10-CM

## 2014-02-17 DIAGNOSIS — D732 Chronic congestive splenomegaly: Secondary | ICD-10-CM

## 2014-02-17 LAB — TYPE AND SCREEN
ABO/RH(D): O POS
Antibody Screen: NEGATIVE
UNIT DIVISION: 0
Unit division: 0

## 2014-02-17 LAB — CBC WITH DIFFERENTIAL/PLATELET
BASOS PCT: 0 % (ref 0–1)
Basophils Absolute: 0 10*3/uL (ref 0.0–0.1)
EOS PCT: 1 % (ref 0–5)
Eosinophils Absolute: 0.1 10*3/uL (ref 0.0–0.7)
HCT: 27.2 % — ABNORMAL LOW (ref 36.0–46.0)
HEMOGLOBIN: 8.4 g/dL — AB (ref 12.0–15.0)
LYMPHS PCT: 7 % — AB (ref 12–46)
Lymphs Abs: 0.6 10*3/uL — ABNORMAL LOW (ref 0.7–4.0)
MCH: 22.3 pg — ABNORMAL LOW (ref 26.0–34.0)
MCHC: 30.9 g/dL (ref 30.0–36.0)
MCV: 72.3 fL — ABNORMAL LOW (ref 78.0–100.0)
MONO ABS: 0.6 10*3/uL (ref 0.1–1.0)
Monocytes Relative: 7 % (ref 3–12)
NEUTROS PCT: 85 % — AB (ref 43–77)
Neutro Abs: 7 10*3/uL (ref 1.7–7.7)
PLATELETS: 75 10*3/uL — AB (ref 150–400)
RBC: 3.76 MIL/uL — AB (ref 3.87–5.11)
RDW: 20.4 % — ABNORMAL HIGH (ref 11.5–15.5)
WBC: 8.3 10*3/uL (ref 4.0–10.5)

## 2014-02-17 LAB — COMPREHENSIVE METABOLIC PANEL
ALK PHOS: 76 U/L (ref 39–117)
ALT: 14 U/L (ref 0–35)
AST: 28 U/L (ref 0–37)
Albumin: 2.7 g/dL — ABNORMAL LOW (ref 3.5–5.2)
BUN: 26 mg/dL — ABNORMAL HIGH (ref 6–23)
CALCIUM: 8 mg/dL — AB (ref 8.4–10.5)
CO2: 25 meq/L (ref 19–32)
Chloride: 95 mEq/L — ABNORMAL LOW (ref 96–112)
Creatinine, Ser: 1.26 mg/dL — ABNORMAL HIGH (ref 0.50–1.10)
GFR, EST AFRICAN AMERICAN: 52 mL/min — AB (ref >90)
GFR, EST NON AFRICAN AMERICAN: 45 mL/min — AB (ref >90)
GLUCOSE: 147 mg/dL — AB (ref 70–99)
POTASSIUM: 4 meq/L (ref 3.7–5.3)
Sodium: 133 mEq/L — ABNORMAL LOW (ref 137–147)
TOTAL PROTEIN: 6.2 g/dL (ref 6.0–8.3)
Total Bilirubin: 0.8 mg/dL (ref 0.3–1.2)

## 2014-02-17 LAB — HEPATITIS PANEL, ACUTE
HCV AB: NEGATIVE
HEP B C IGM: NONREACTIVE
HEP B S AG: NEGATIVE
Hep A IgM: NONREACTIVE

## 2014-02-17 LAB — GLUCOSE, CAPILLARY
GLUCOSE-CAPILLARY: 186 mg/dL — AB (ref 70–99)
GLUCOSE-CAPILLARY: 211 mg/dL — AB (ref 70–99)
Glucose-Capillary: 145 mg/dL — ABNORMAL HIGH (ref 70–99)
Glucose-Capillary: 219 mg/dL — ABNORMAL HIGH (ref 70–99)

## 2014-02-17 LAB — URINE CULTURE

## 2014-02-17 LAB — HIV ANTIBODY (ROUTINE TESTING W REFLEX): HIV 1&2 Ab, 4th Generation: NONREACTIVE

## 2014-02-17 MED ORDER — SODIUM CHLORIDE 0.9 % IV SOLN
INTRAVENOUS | Status: DC
  Administered 2014-02-17: 22:00:00 via INTRAVENOUS

## 2014-02-17 MED ORDER — LEVOTHYROXINE SODIUM 75 MCG PO TABS
75.0000 ug | ORAL_TABLET | Freq: Every day | ORAL | Status: DC
  Administered 2014-02-18 – 2014-02-20 (×3): 75 ug via ORAL
  Filled 2014-02-17 (×3): qty 1

## 2014-02-17 MED ORDER — ZOLPIDEM TARTRATE 5 MG PO TABS
5.0000 mg | ORAL_TABLET | Freq: Every evening | ORAL | Status: DC | PRN
  Administered 2014-02-17 – 2014-02-19 (×3): 5 mg via ORAL
  Filled 2014-02-17 (×3): qty 1

## 2014-02-17 NOTE — Progress Notes (Signed)
Pt's foley removed per MD order. Pt has voided since foley has been removed. We were unable to measure output as pt removed hat from toilet. I reeducated importance of monitoring output and replaced hat in toilet. Will continue to monitor.

## 2014-02-17 NOTE — Progress Notes (Signed)
Referring Provider: No ref. provider found Primary Care Physician:  No primary provider on file. Primary Gastroenterologist:  Dr. Gala Romney  Reason for Consultation:  Microcytic anemia  HPI:  Penny Martinez 63 year old lady admitted to the hospital with urosepsis/septic shock. Found to have significant microcytic anemia and thrombocytopenia.  No evidence of GI bleeding, however. She was fluid resuscitated and started on broad-spectrum antibiotics.  Initial hemoglobin and hematocrit 7.1 and 68.7 platelets 118,000 white count 14,000; iron saturation 5% ; TIBC 410, serum iron 19.  Hospitalization thus far has been on remarkable. No evidence GI bleeding. No Hemoccults documented.  Significant urinary retention requiring Foley catheter. CT scan for right upper quadrant fullness revealed a nodular liver along with splenomegaly and early portal venous hypertension consistent with underlying cirrhosis which is apparently a new finding. Patient denies any prior GI evaluation other than a barium enema many, many years ago. No prior EGD or colonoscopy.  ALT, AST, alkaline phosphatase and total bilirubin repeatedly normal this hospitalization.  No prior liver issues and no family history of liver disease.  No high risk behaviors Patient is essentially devoid of any GI symptoms including nausea vomiting, GERD, odynophagia, dysphagia, early satiety, weight loss, abdominal pain or change in bowel function. Specifically denies melena or hematochezia.   Past Medical History  Diagnosis Date  . Diabetes mellitus without complication   . COPD (chronic obstructive pulmonary disease)   . Hypertension   . Hypercholesteremia   . Acid reflux   . IBS (irritable bowel syndrome)   . CHF (congestive heart failure)     Past Surgical History  Procedure Laterality Date  . 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 Abdominal hysterectomy    . Cholecystectomy    . Appendectomy      Prior to Admission  medications   Medication Sig Start Date End Date Taking? Authorizing Provider  benazepril (LOTENSIN) 20 MG tablet Take 20 mg by mouth daily.   Yes Historical Provider, MD  Canagliflozin-Metformin HCl (INVOKAMET) 50-1000 MG TABS Take 1 tablet by mouth 2 (two) times daily.   Yes Historical Provider, MD  furosemide (LASIX) 80 MG tablet Take 80 mg by mouth daily.   Yes Historical Provider, MD  gabapentin (NEURONTIN) 300 MG capsule Take 300 mg by mouth 3 (three) times daily.   Yes Historical Provider, MD  insulin aspart (NOVOLOG) 100 UNIT/ML injection Inject 15 Units into the skin 3 (three) times daily before meals.    Yes Historical Provider, MD  insulin glargine (LANTUS) 100 UNIT/ML injection Inject 90 Units into the skin at bedtime.   Yes Historical Provider, MD  lansoprazole (PREVACID) 30 MG capsule Take 30 mg by mouth daily.   Yes Historical Provider, MD  levothyroxine (SYNTHROID, LEVOTHROID) 75 MCG tablet Take 75 mcg by mouth daily before breakfast.   Yes Historical Provider, MD  Liraglutide (VICTOZA) 18 MG/3ML SOPN Inject 1.8 mg into the skin daily.    Yes Historical Provider, MD  potassium chloride (K-DUR) 10 MEQ tablet Take 10 mEq by mouth daily.   Yes Historical Provider, MD    Current Facility-Administered Medications  Medication Dose Route Frequency Provider Last Rate Last Dose  . 0.9 %  sodium chloride infusion   Intravenous Continuous Nimish C Gosrani, MD 100 mL/hr at 02/17/14 0600    . cefTAZidime (FORTAZ) 2 g in dextrose 5 % 50 mL IVPB  2 g Intravenous Q12H Raylene Miyamoto, MD   2 g at 02/17/14 0900  . gabapentin (NEURONTIN)  capsule 300 mg  300 mg Oral TID Dianne Dun, NP   300 mg at 02/17/14 0900  . guaiFENesin-dextromethorphan (ROBITUSSIN DM) 100-10 MG/5ML syrup 5 mL  5 mL Oral Q4H PRN Dianne Dun, NP   5 mL at 02/16/14 2151  . heparin injection 5,000 Units  5,000 Units Subcutaneous 3 times per day Doree Albee, MD   5,000 Units at 02/17/14 0541  .  insulin aspart (novoLOG) injection 0-20 Units  0-20 Units Subcutaneous TID WC Doree Albee, MD   3 Units at 02/17/14 0755  . insulin aspart (novoLOG) injection 0-5 Units  0-5 Units Subcutaneous QHS Doree Albee, MD   2 Units at 02/15/14 2201  . insulin glargine (LANTUS) injection 90 Units  90 Units Subcutaneous QHS Doree Albee, MD   90 Units at 02/16/14 2233  . ondansetron (ZOFRAN) tablet 4 mg  4 mg Oral Q6H PRN Nimish C Gosrani, MD       Or  . ondansetron (ZOFRAN) injection 4 mg  4 mg Intravenous Q6H PRN Nimish C Gosrani, MD      . pantoprazole (PROTONIX) EC tablet 40 mg  40 mg Oral Daily Dianne Dun, NP   40 mg at 02/17/14 0900    Allergies as of 02/15/2014 - Review Complete 02/15/2014  Allergen Reaction Noted  . Erythromycin  02/15/2014  . Penicillins  02/15/2014  . Sulfa antibiotics  02/15/2014    History reviewed. No pertinent family history.  History   Social History  . Marital Status: Married    Spouse Name: N/A    Number of Children: N/A  . Years of Education: N/A   Occupational History  . Not on file.   Social History Main Topics  . Smoking status: Former Research scientist (life sciences)  . Smokeless tobacco: Former Systems developer  . Alcohol Use: No  . Drug Use: No  . Sexual Activity: Not Currently   Other Topics Concern  . Not on file   Social History Narrative  . No narrative on file    Review of Systems: Gen: Denies weight loss, and sleep disorder CV: Denies chest pain, angina, palpitations, syncope, orthopnea, PND, peripheral edema, and claudication. GI: Denies vomiting blood, jaundice, and fecal incontinence.   Denies dysphagia or odynophagia. Derm: Denies rash, itching, dry skin, hives, moles, warts, or unhealing ulcers.  Psych: Denies depression, anxiety, memory loss, suicidal ideation, hallucinations, paranoia, and confusion. Heme: Denies bruising, bleeding, and enlarged lymph nodes.   Physical Exam: Vital signs in last 24 hours: Temp:  [98.1 F (36.7  C)-99.4 F (37.4 C)] 99.4 F (37.4 C) (06/13 0800) Pulse Rate:  [66-86] 81 (06/13 0630) Resp:  [12-22] 16 (06/13 0630) BP: (90-138)/(27-62) 119/58 mmHg (06/13 0630) SpO2:  [87 %-100 %] 94 % (06/13 0630) Last BM Date: 02/15/14 General:   Alert, conversant and appears to be in no acute distress. Accompanied by her husband in the ICU room. Head:  Normocephalic and atraumatic. Mouth:  No deformity or lesions, dentition normal. Neck:  Supple; no masses or thyromegaly. Lungs:  Clear throughout to auscultation.   No wheezes, crackles, or rhonchi. No acute distress. Heart:  Regular rate and rhythm; no murmurs, clicks, rubs,  or gallops. Abdomen: Obese.   Positive bowel sounds. Soft and nontender. Liver edge appreciated at 4 fingerbreadths below the right costal margin margin. Spleen is ballotable. Msk:  Symmetrical without gross deformities. Normal posture. Pulses:  Normal pulses noted. Extremities:  Without clubbing or edema. Neurologic:  Alert and  oriented x4;  grossly normal neurologically. Skin:  Intact without significant lesions or rashes.  Intake/Output from previous day: 06/12 0701 - 06/13 0700 In: 2550 [I.V.:2500; IV Piggyback:50] Out: 1600 [Urine:1600] Intake/Output this shift: Total I/O In: -  Out: 825 [Urine:825]  Lab Results:  Recent Labs  02/15/14 1725 02/16/14 0700 02/17/14 0534  WBC 14.0* 16.3* 8.3  HGB 7.1* 8.4* 8.4*  HCT 24.4* 27.5* 27.2*  PLT 118* 106* 75*   BMET  Recent Labs  02/15/14 1725 02/16/14 0700 02/17/14 0534  NA 134* 132* 133*  K 4.5 4.1 4.0  CL 92* 93* 95*  CO2 27 25 25   GLUCOSE 201* 232* 147*  BUN 25* 32* 26*  CREATININE 1.49* 1.62* 1.26*  CALCIUM 8.9 8.2* 8.0*   LFT  Recent Labs  02/17/14 0534  PROT 6.2  ALBUMIN 2.7*  AST 28  ALT 14  ALKPHOS 76  BILITOT 0.8  Studies/Results: Ct Abdomen Pelvis W Contrast  02/16/2014   CLINICAL DATA:  Fever, right upper quadrant pain, diarrhea, nausea  EXAM: CT ABDOMEN AND PELVIS WITH  CONTRAST  TECHNIQUE: Multidetector CT imaging of the abdomen and pelvis was performed using the standard protocol following bolus administration of intravenous contrast.  CONTRAST:  28m OMNIPAQUE IOHEXOL 300 MG/ML SOLN, 869mOMNIPAQUE IOHEXOL 300 MG/ML SOLN  COMPARISON:  None.  FINDINGS: Lung bases are unremarkable.  Mild posterior basilar atelectasis.  Sagittal images of the spine shows multilevel degenerative changes thoracolumbar spine. Extensive atherosclerotic calcifications of abdominal aorta and iliac arteries. No aortic aneurysm. There is subtle nodular contour of the liver. Clinical correlation is necessary to exclude cirrhosis.  Splenomegaly is noted.  The spleen measures 16 cm in length.  Portal vein measures 1.4 cm in diameter.  Atherosclerotic calcifications of coronary arteries. No aortic aneurysm.  There is a low-density nodular thickening of left adrenal gland measures 1.8 cm. This is suspicious for adrenal adenoma.  The kidneys show symmetrical enhancement. Nonspecific mild right perinephric and proximal right periureteral stranding.  Delayed renal images shows persistent parenchymal enhancement with delay excretion of contrast material. Findings are suspicious for renal failure/insufficiency or medical renal disease. Clinical correlation is necessary. No retroperitoneal adenopathy. No mesenteric fluid collection. Small hiatal hernia.  Distended urinary bladder is noted. There is mild skin thickening and stranding subcutaneous fat in anterior pelvic wall. This may be due to subcutaneous injections or mild cellulitis. Clinical correlation is necessary. There is no abdominal or pelvic ascites. No small bowel obstruction.  No pericecal inflammation. The patient is status post appendectomy. Status postcholecystectomy. Status post hysterectomy. No inguinal adenopathy. No destructive bony lesions are noted within pelvis.  IMPRESSION: 1. There is subtle micronodular contour of the liver. Clinical  correlation is necessary to exclude early cirrhosis. Splenomegaly is noted. The spleen measures 16 cm in length. Portal vein measures 1.4 cm in diameter suspicious for portal hypertension. 2. There is bilateral renal delay excretion with persistent parenchymal enhancement on delayed images suspicious for renal insufficiency or medical renal disease. Clinical correlation is necessary. Nonspecific mild right perinephric and right proximal periureteral stranding. No calcified ureteral calculi are noted. 3. No small bowel or colonic obstruction. 4. Degenerative changes thoracolumbar spine. 5. Status post hysterectomy, status post appendectomy. Status post cholecystectomy. 6. Extensive atherosclerotic calcifications abdominal aorta and iliac arteries. 7. Moderate distended urinary bladder. No calcified calculi are noted within urinary bladder. 8. There is mild skin thickening and subcutaneous stranding anterior pelvic wall. This may be due to subcutaneous injections or mild cellulitis. Clinical correlation is necessary. These results  were called by telephone at the time of interpretation on 02/16/2014 at 10:24 AM to Dr. Hurshel Party , who verbally acknowledged these results.   Electronically Signed   By: Lahoma Crocker M.D.   On: 02/16/2014 10:25   Dg Chest Port 1 View  02/15/2014   CLINICAL DATA:  Fever. Weakness. Vomiting. Diarrhea. Chest discomfort.  EXAM: PORTABLE CHEST - 1 VIEW  COMPARISON:  None.  FINDINGS: The cardiopericardial silhouette is enlarged. Pulmonary vascular congestion is present with cephalization of pulmonary blood flow. Basilar atelectasis. Evaluation of the bases is suboptimal due to body habitus and underpenetration from portable technique. No pleural effusions. No gross consolidation.  IMPRESSION: Cardiomegaly and pulmonary vascular congestion. No focal consolidation.   Electronically Signed   By: Dereck Ligas M.D.   On: 02/15/2014 17:45   Impression:  Penny Martinez obese 64 year old lady with  diabetes admitted to the  hospital with urinary retention, urosepsis and profound microcytic anemia. No evidence of GI bleeding at this time. CT suggestive of well-established cirrhosis. No prior EGD or colonoscopy. Thrombocytopenia likely secondary to splenic sequestration secondary to cirrhosis. Etiology of cirrhosis more likely secondary to NASH in the setting of obesity and relatively poorly controlled diabetes. Need to screen for chronic viral hepatitis. I doubt autoimmune liver disease or other relatively rare disorders such as a Wilson's disease, alpha-1 antitrypsin deficiency, etc.  Given the patient is being treated in the ICU for urosepsis and there is no evidence of GI bleeding, urgent endoscopic evaluation is not warranted at this time.  Recommendations:    Follow hemoglobin. Watch for signs of GI bleeding particularly with thrombocytopenia and ongoing treatment with heparin for DVT prophylaxis.   Agree with PPI empirically for now.  As long as Hemoglobin remains in the 8 Range, would not plan to transfuse. Eventually, patient needs to have a diagnostic colonoscopy as well as an EGD at least, in part, to screen for esophageal varices. Needs to be screened for chronic hepatitis B. and C as well.  Also, immune status needs to be determined regarding hepatitis A and B. She may need to be vaccinated. Hepatoma screening via ultrasound every 6 months also needed. Thanks for consult.  Will follow with you.     Notice:  This dictation was prepared with Dragon dictation along with smaller phrase technology. Any transcriptional errors that result from this process are unintentional and may not be corrected upon review.

## 2014-02-17 NOTE — Progress Notes (Signed)
Pt transferred to floor from ICU. Pt is in no acute distress and VS are stable. Pt is alert and oriented, will continue to monitor.

## 2014-02-17 NOTE — Progress Notes (Signed)
Pt a/o.vss. Up in the recliner. No complaints of any distress. Report called to [facility]. Pt to be transferred to room 303.

## 2014-02-17 NOTE — Progress Notes (Addendum)
Note: This document was prepared with digital dictation and possible smart phrase technology. Any transcriptional errors that result from this process are unintentional.   Penny Martinez QQP:[id number] DOB: 05/18/1951 DOA: 02/15/2014 PCP: No primary provider on file.  Brief narrative: 63 y/o ?, known h/o DM ty 2 diagnosed before age 63 ,? 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 She states that she was well until [facility] Day when she went with her family to the beach and then started feeling ill. She had fevers and chills and a nonproductive cough and she felt was bronchitis. On 6/11 she felt extremely hot and went to her 62 office and was admitted because of hypotension and fever   She was also found to have microcytic anemia and was transfused 2 U prbc She has not had a history of colonoscopy in the past and has no dark stools or tarry stools.  Blood cultures preliminary grew gram-negative rods CT scan showed cirrhosis and Spelnomegally  Past medical history-As per Problem list Chart reviewed as below- And  Consultants:  Gastroenterology  Procedures:  None  Antibiotics:  Ceftriaxone 6/11-6/12  Ceftazadime 6/12   Subjective   Looks much better.  No n/v/cp Mild Lumbar back pain which isn't new No blurred vision No stool reported and ?no dark stool   Objective    Interim History:   Telemetry: Sinus rhythm   Objective: Filed Vitals:   02/17/14 0530 02/17/14 0600 02/17/14 0630 02/17/14 0800  BP: 118/57 138/61 119/58   Pulse: 76 81 81   Temp:    99.4 F (37.4 C)  TempSrc:    Oral  Resp: 16 13 16    Height:      Weight:      SpO2: 99% 92% 94%     Intake/Output Summary (Last 24 hours) at 02/17/14 0914 Last data filed at 02/17/14 0800  Gross per 24 hour  Intake   2550 ml  Output   2425  ml  Net    125 ml    Exam:  General: Alert and oriented in no apparent distress Cardiovascular: S1-S2 no murmur rub or gallop Respiratory:  clear no added sounds Abdomen:  soft nontender nondistended no rebound Skin no lower extremity edema Neuro grossly intact  Data Reviewed: Basic Metabolic Panel:  Recent Labs Lab 02/15/14 1725 02/16/14 0700 02/17/14 0534  NA 134* 132* 133*  K 4.5 4.1 4.0  CL 92* 93* 95*  CO2 27 25 25   GLUCOSE 201* 232* 147*  BUN 25* 32* 26*  CREATININE 1.49* 1.62* 1.26*  CALCIUM 8.9 8.2* 8.0*   Liver Function Tests:  Recent Labs Lab 02/16/14 0700 02/17/14 0534  AST 28 28  ALT 15 14  ALKPHOS 90 76  BILITOT 0.9 0.8  PROT 6.3 6.2  ALBUMIN 2.9* 2.7*   No results found for this basename: LIPASE, AMYLASE,  in the last 168 hours No results found for this basename: AMMONIA,  in the last 168 hours CBC:  Recent Labs Lab 02/15/14 1725 02/16/14 0700 02/17/14 0534  WBC 14.0* 16.3* 8.3  NEUTROABS 12.9*  --  7.0  HGB 7.1* 8.4* 8.4*  HCT 24.4* 27.5* 27.2*  MCV 68.7* 72.8* 72.3*  PLT 118* 106* 75*   Cardiac Enzymes:  Recent Labs Lab 02/15/14 1725  TROPONINI <0.30   BNP: No components found with this basename: POCBNP,  CBG:  Recent Labs  Lab 02/16/14 0734 02/16/14 1059 02/16/14 1639 02/16/14 2148 02/17/14 0736  GLUCAP 226* 257* 210* 186* 145*    Recent Results (from the past 240 hour(s))  URINE CULTURE     Status: None   Collection Time    02/15/14  6:09 PM      Result Value Ref Range Status   Specimen Description URINE, CLEAN CATCH   Final   Special Requests NONE   Final   Culture  Setup Time     Final   Value: 02/15/2014 23:19     Performed at [facility]     Final   Value: >=100,000 COLONIES/ML     Performed at [facility]   Culture     Final   Value: ESCHERICHIA COLI     Performed at [facility]   Report Status PENDING   Incomplete  CULTURE, BLOOD (ROUTINE X 2)     Status:  None   Collection Time    02/15/14  6:54 PM      Result Value Ref Range Status   Specimen Description BLOOD RIGHT HAND   Final   Special Requests BOTTLES DRAWN AEROBIC AND ANAEROBIC 12CC   Final   Culture NO GROWTH 2 DAYS   Final   Report Status PENDING   Incomplete  CULTURE, BLOOD (ROUTINE X 2)     Status: None   Collection Time    02/15/14  7:00 PM      Result Value Ref Range Status   Specimen Description BLOOD RIGHT HAND   Final   Special Requests     Final   Value: BOTTLES DRAWN AEROBIC AND ANAEROBIC AEB=12CC ANA=8CC   Culture     Final   Value: GRAM NEGATIVE RODS     Gram Stain Report Called to,Read Back By and Verified With: NADINE ROWE RN ON [id number] AT 0820 BY RESSEGGER R   Report Status PENDING   Incomplete  MRSA PCR SCREENING     Status: None   Collection Time    02/15/14  9:10 PM      Result Value Ref Range Status   MRSA by PCR NEGATIVE  NEGATIVE Final   Comment:            The GeneXpert MRSA Assay (FDA     approved for NASAL specimens     only), is one component of a     comprehensive MRSA colonization     surveillance program. It is not     intended to diagnose MRSA     infection nor to guide or     monitor treatment for     MRSA infections.     Studies:              All Imaging reviewed and is as per above notation   Scheduled Meds: . cefTAZidime (FORTAZ)  IV  2 g Intravenous Q12H  . gabapentin  300 mg Oral TID  . heparin  5,000 Units Subcutaneous 3 times per day  . insulin aspart  0-20 Units Subcutaneous TID WC  . insulin aspart  0-5 Units Subcutaneous QHS  . insulin glargine  90 Units Subcutaneous QHS  . pantoprazole  40 mg Oral Daily   Continuous Infusions: . sodium chloride 100 mL/hr at 02/17/14 0600     Assessment/Plan:  1. Sepsis likely secondary to pyelonephritis with gram-negative rods/E.coli-continue ceftazidime q 12-await culture and sensitivities. continue IV fluids and 100 cc per hour, CBC plus differential  in a.m.  Tylenol for  fever 2. 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 Is resolving slowly 3. Cirrhosis as well as splenomegally-differential diagnosis broad but includes potential fatty liver leading to cirrhosis because of habitus, hepatitis [patient had a transfusion in 1984], less likely but still possible for splenomegaly or HIV, and tumor. I will get acute hepatitis panel, platelets smear given thrombocytopenia, hemoglobin electrophoresis-if initial labs do not reveal anything, could potentially be autoimmune hepatitis, metabolic disease--we will hold off on those labs to workup until preliminary results are available. Obtain INR 4. Diabetes mellitus type 2-continue 90 units Lantus each bedtime, resistant sliding scale coverage.  Victoza on hold-blood sugars controlled between 140-180's-Victoza and Canagliflozin-metformin on hold and he for now. 5. Microcytic anemia-status post 2 units packed cell transfusion-hemoglobin acceptable went from 7.1-->8.4.  Gastroenterology consulted. Likely iron deficiency anemia based on anemia panel done 02/15/14, however this is not completely clear and electrophoresis is pending 6. Congestive heart failure-no echocardiogram recorded in system, monitor. Is not on an ACE inhibitor or beta blocker for unclear reason-hold lasix for now as was hypotensive on andssion 7. Thrombocytopenia-unclear etiology. Monitor could be secondary to sepsis 8. Morbid obesity, Body mass index is 38.88 kg/(m^2). patient will require outpatient counseling regarding the same  Code Status: full Family Communication:  None bedside Disposition Plan:  Inpatient-transfer to MedSurg as hemodynamically stable   Verneita Griffes, MD  [facility] Pager [phone] 02/17/2014, 9:14 AM    LOS: 2 days

## 2014-02-18 LAB — COMPREHENSIVE METABOLIC PANEL
ALBUMIN: 2.7 g/dL — AB (ref 3.5–5.2)
ALT: 12 U/L (ref 0–35)
AST: 24 U/L (ref 0–37)
Alkaline Phosphatase: 73 U/L (ref 39–117)
BUN: 15 mg/dL (ref 6–23)
CALCIUM: 8.4 mg/dL (ref 8.4–10.5)
CO2: 24 mEq/L (ref 19–32)
Chloride: 103 mEq/L (ref 96–112)
Creatinine, Ser: 1.05 mg/dL (ref 0.50–1.10)
GFR calc non Af Amer: 56 mL/min — ABNORMAL LOW (ref >90)
GFR, EST AFRICAN AMERICAN: 65 mL/min — AB (ref >90)
Glucose, Bld: 144 mg/dL — ABNORMAL HIGH (ref 70–99)
Potassium: 4.1 mEq/L (ref 3.7–5.3)
SODIUM: 138 meq/L (ref 137–147)
TOTAL PROTEIN: 6.2 g/dL (ref 6.0–8.3)
Total Bilirubin: 0.5 mg/dL (ref 0.3–1.2)

## 2014-02-18 LAB — GLUCOSE, CAPILLARY
GLUCOSE-CAPILLARY: 133 mg/dL — AB (ref 70–99)
GLUCOSE-CAPILLARY: 198 mg/dL — AB (ref 70–99)
Glucose-Capillary: 134 mg/dL — ABNORMAL HIGH (ref 70–99)
Glucose-Capillary: 139 mg/dL — ABNORMAL HIGH (ref 70–99)

## 2014-02-18 LAB — CBC WITH DIFFERENTIAL/PLATELET
BASOS ABS: 0 10*3/uL (ref 0.0–0.1)
Basophils Relative: 0 % (ref 0–1)
Eosinophils Absolute: 0.1 10*3/uL (ref 0.0–0.7)
Eosinophils Relative: 1 % (ref 0–5)
HCT: 26.2 % — ABNORMAL LOW (ref 36.0–46.0)
Hemoglobin: 7.8 g/dL — ABNORMAL LOW (ref 12.0–15.0)
LYMPHS ABS: 1 10*3/uL (ref 0.7–4.0)
LYMPHS PCT: 17 % (ref 12–46)
MCH: 21.6 pg — ABNORMAL LOW (ref 26.0–34.0)
MCHC: 29.8 g/dL — ABNORMAL LOW (ref 30.0–36.0)
MCV: 72.6 fL — ABNORMAL LOW (ref 78.0–100.0)
MONOS PCT: 9 % (ref 3–12)
Monocytes Absolute: 0.5 10*3/uL (ref 0.1–1.0)
NEUTROS PCT: 73 % (ref 43–77)
Neutro Abs: 4.2 10*3/uL (ref 1.7–7.7)
PLATELETS: 91 10*3/uL — AB (ref 150–400)
RBC: 3.61 MIL/uL — AB (ref 3.87–5.11)
RDW: 20.6 % — AB (ref 11.5–15.5)
WBC: 5.8 10*3/uL (ref 4.0–10.5)

## 2014-02-18 LAB — CULTURE, BLOOD (ROUTINE X 2)

## 2014-02-18 LAB — OCCULT BLOOD X 1 CARD TO LAB, STOOL: Fecal Occult Bld: NEGATIVE

## 2014-02-18 LAB — PROTIME-INR
INR: 1.15 (ref 0.00–1.49)
PROTHROMBIN TIME: 14.5 s (ref 11.6–15.2)

## 2014-02-18 MED ORDER — PEG 3350-KCL-NABCB-NACL-NASULF 236 G PO SOLR
2000.0000 mL | ORAL | Status: AC
  Administered 2014-02-18 – 2014-02-19 (×2): 2000 mL via ORAL
  Filled 2014-02-18: qty 4000

## 2014-02-18 MED ORDER — FUROSEMIDE 20 MG PO TABS: 20.0000 mg | ORAL_TABLET | Freq: Once | ORAL | Status: DC

## 2014-02-18 MED ORDER — DEXTROSE 5 % IV SOLN
2.0000 g | Freq: Three times a day (TID) | INTRAVENOUS | Status: DC
  Filled 2014-02-18 (×3): qty 2

## 2014-02-18 MED ORDER — CIPROFLOXACIN HCL 250 MG PO TABS
500.0000 mg | ORAL_TABLET | Freq: Two times a day (BID) | ORAL | Status: DC
  Administered 2014-02-18 – 2014-02-20 (×5): 500 mg via ORAL
  Filled 2014-02-18 (×5): qty 2

## 2014-02-18 MED ORDER — FUROSEMIDE 20 MG PO TABS
20.0000 mg | ORAL_TABLET | Freq: Once | ORAL | Status: AC
  Administered 2014-02-18: 20 mg via ORAL
  Filled 2014-02-18: qty 1

## 2014-02-18 NOTE — Progress Notes (Signed)
Patient now on 300 doing well. No GI complaints. Has had a couple of regular appearing bowel movements  Still no Hemoccults done-d iscussed with nursing staff   Vital signs in last 24 hours: Temp:  [98.7 F (37.1 C)-99.8 F (37.7 C)] 98.8 F (37.1 C) (06/14 0549) Pulse Rate:  [80-92] 80 (06/14 0549) Resp:  [14-20] 14 (06/14 0549) BP: (119-174)/(49-75) 160/72 mmHg (06/14 0549) SpO2:  [88 %-96 %] 96 % (06/14 0549) Last BM Date: 02/17/14 General:   Alert,  conversant pleasant and cooperative in NAD; accompanied by multiple family members Abdomen:  Obese..  Normal bowel sounds, without guarding, and without rebound.  No mass or organomegaly. Extremities:  Without clubbing or edema.    Intake/Output from previous day: 06/13 0701 - 06/14 0700 In: 3734 [P.O.:570; I.V.:925; IV Piggyback:100] Out: 2876 [Urine:3925] Intake/Output this shift: Total I/O In: 240 [P.O.:240] Out: 600 [Urine:600]  Lab Results:  Recent Labs  02/16/14 0700 02/17/14 0534 02/18/14 0553  WBC 16.3* 8.3 5.8  HGB 8.4* 8.4* 7.8*  HCT 27.5* 27.2* 26.2*  PLT 106* 75* 91*   BMET  Recent Labs  02/16/14 0700 02/17/14 0534 02/18/14 0553  NA 132* 133* 138  K 4.1 4.0 4.1  CL 93* 95* 103  CO2 25 25 24   GLUCOSE 232* 147* 144*  BUN 32* 26* 15  CREATININE 1.62* 1.26* 1.05  CALCIUM 8.2* 8.0* 8.4   LFT  Recent Labs  02/18/14 0553  PROT 6.2  ALBUMIN 2.7*  AST 24  ALT 12  ALKPHOS 73  BILITOT 0.5   PT/INR  Recent Labs  02/18/14 0553  LABPROT 14.5  INR 1.15   Hepatitis Panel  Recent Labs  02/17/14 1035  HEPBSAG NEGATIVE  HCVAB NEGATIVE  HEPAIGM NON REACTIVE  HEPBIGM NON REACTIVE   Impression:  Pleasant 63 year old lady admitted with urosepsis and profound iron deficiency anemia. Her condition is markedly improved over that seen on admission.  No evidence of GI bleeding at this time. She appears to have cirrhosis on cross-sectional imaging. Likely secondary to NASH. Viral markers for  hepatitis B and C are negative. She needs an EGD and a colonoscopy as stated previously. I discussed the timing of this approach with her today.   She states she would rather wait and come back as an outpatient. She does live quite a ways from the hospital. May be better to go ahead and do both an EGD and a colonoscopy while she is here. After some discussion, she agreed with this approach.  Recommendations: EGD and colonoscopy tomorrow afternoon.The risks, benefits, limitations, imponderables and alternatives regarding both EGD and colonoscopy have been reviewed with the patient. Questions have been answered. All parties agreeable.  Heparin will be discontinued.  Further recommendations to follow.

## 2014-02-18 NOTE — Progress Notes (Signed)
ANTIBIOTIC CONSULT NOTE - Pharmacy Consult for Ceftazidime Indication: bactermia  Allergies  Allergen Reactions  . Erythromycin   . Penicillins   . Sulfa Antibiotics     Patient Measurements: Height: 5' 1"  (154.9 cm) Weight: 205 lb 11 oz (93.3 kg) IBW/kg (Calculated) : 47.8 Adjusted Body Weight:   Vital Signs: Temp: 98.8 F (37.1 C) (06/14 0549) Temp src: Oral (06/14 0549) BP: 160/72 mmHg (06/14 0549) Pulse Rate: 80 (06/14 0549) Intake/Output from previous day: 06/13 0701 - 06/14 0700 In: 1497 [P.O.:570; I.V.:925; IV Piggyback:100] Out: 0263 [Urine:3925] Intake/Output from this shift: Total I/O In: 240 [P.O.:240] Out: -   Labs:  Recent Labs  02/16/14 0700 02/17/14 0534 02/18/14 0553  WBC 16.3* 8.3 5.8  HGB 8.4* 8.4* 7.8*  PLT 106* 75* 91*  CREATININE 1.62* 1.26* 1.05   Estimated Creatinine Clearance: 57.9 ml/min (by C-G formula based on Cr of 1.05). No results found for this basename: VANCOTROUGH, Corlis Leak, VANCORANDOM, [place], GENTPEAK, GENTRANDOM, TOBRATROUGH, TOBRAPEAK, TOBRARND, AMIKACINPEAK, AMIKACINTROU, AMIKACIN,  in the last 72 hours   Microbiology: Recent Results (from the past 720 hour(s))  URINE CULTURE     Status: None   Collection Time    02/15/14  6:09 PM      Result Value Ref Range Status   Specimen Description URINE, CLEAN CATCH   Final   Special Requests NONE   Final   Culture  Setup Time     Final   Value: 02/15/2014 23:19     Performed at [facility]     Final   Value: >=100,000 COLONIES/ML     Performed at [facility]   Culture     Final   Value: ESCHERICHIA COLI     Performed at [facility]   Report Status 02/17/2014 FINAL   Final   Organism ID, Bacteria ESCHERICHIA COLI   Final  CULTURE, BLOOD (ROUTINE X 2)     Status: None   Collection Time    02/15/14  6:54 PM      Result Value Ref Range Status   Specimen Description BLOOD RIGHT HAND   Final   Special Requests BOTTLES DRAWN  AEROBIC AND ANAEROBIC 12CC   Final   Culture NO GROWTH 3 DAYS   Final   Report Status PENDING   Incomplete  CULTURE, BLOOD (ROUTINE X 2)     Status: None   Collection Time    02/15/14  7:00 PM      Result Value Ref Range Status   Specimen Description BLOOD RIGHT HAND   Final   Special Requests     Final   Value: BOTTLES DRAWN AEROBIC AND ANAEROBIC AEB=12CC ANA=8CC   Culture  Setup Time     Final   Value: 02/16/2014 16:57     Performed at [facility]   Culture     Final   Value: ESCHERICHIA COLI     Note: Gram Stain Report Called to,Read Back By and Verified With: NADINE ROWE RN ON [id number] AT 0820 BY RESSEGGER R     Performed at [facility]   Report Status PENDING   Incomplete  MRSA PCR SCREENING     Status: None   Collection Time    02/15/14  9:10 PM      Result Value Ref Range Status   MRSA by PCR NEGATIVE  NEGATIVE Final   Comment:            The GeneXpert MRSA  Assay (FDA     approved for NASAL specimens     only), is one component of a     comprehensive MRSA colonization     surveillance program. It is not     intended to diagnose MRSA     infection nor to guide or     monitor treatment for     MRSA infections.    Medical History: Past Medical History  Diagnosis Date  . Diabetes mellitus without complication   . COPD (chronic obstructive pulmonary disease)   . Hypertension   . Hypercholesteremia   . Acid reflux   . IBS (irritable bowel syndrome)   . CHF (congestive heart failure)     Medications:  Scheduled:  . cefTAZidime (FORTAZ)  IV  2 g Intravenous Q8H  . furosemide  20 mg Oral Once  . gabapentin  300 mg Oral TID  . heparin  5,000 Units Subcutaneous 3 times per day  . insulin aspart  0-20 Units Subcutaneous TID WC  . insulin aspart  0-5 Units Subcutaneous QHS  . insulin glargine  90 Units Subcutaneous QHS  . levothyroxine  75 mcg Oral QAC breakfast  . pantoprazole  40 mg Oral Daily   Assessment: Gram Negative noted in blood Change  from Ceftriaxone to Ceftazidime until sensitivity results Renal function has improved, CrCl > 77m/min  Goal of Therapy:  Eradicate infection  Plan:  Change Ceftazidime 2 GM IV to every 8 hours. Monitor renal function Labs per protocol F./U Cultures and sensitivity  PAbner Greenspan Jadier Rockers Bennett 02/18/2014,9:24 AM

## 2014-02-18 NOTE — Progress Notes (Signed)
Note: This document was prepared with digital dictation and possible smart phrase technology. Any transcriptional errors that result from this process are unintentional.   Penny Martinez ZOX:[id number] DOB: 1951/05/26 DOA: 02/15/2014 PCP: No primary provider on file.  Brief narrative: 63 y/o ?, known h/o DM ty 2 diagnosed before age 64 ,? 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 She states that she was well until [facility] Day when she went with her family to the beach and then started feeling ill. She had fevers and chills and a nonproductive cough and she felt was bronchitis. On 6/11 she felt extremely hot and went to her 32 office and was admitted because of hypotension and fever   She was also found to have microcytic anemia and was transfused 2 U prbc She has not had a history of colonoscopy in the past and has no dark stools or tarry stools.  Blood cultures preliminary grew gram-negative rods CT scan showed cirrhosis and Spelnomegally  Past medical history-As per Problem list Chart reviewed as below- And  Consultants:  Gastroenterology  Procedures:  None  Antibiotics:  Ceftriaxone 6/11-6/12  Ceftazadime 6/12   Subjective   Looks better, off of Oxygen.  No cp or SOB currently No abd pain or swelling LE's Stools not hemoccult Eating and drinking okay Scheduled for endoscopy and coloscopy in am and now on liquid diet again   Objective    Interim History:   Telemetry: Sinus rhythm   Objective: Filed Vitals:   02/17/14 1449 02/17/14 2142 02/17/14 2155 02/18/14 0549  BP: 174/70 150/75  160/72  Pulse: 89 92  80  Temp: 99.8 F (37.7 C) 98.7 F (37.1 C)  98.8 F (37.1 C)  TempSrc: Oral Oral  Oral  Resp: 18 20  14   Height:      Weight:      SpO2: 93% 88% 95% 96%    Intake/Output  Summary (Last 24 hours) at 02/18/14 1224 Last data filed at 02/18/14 1218  Gross per 24 hour  Intake   2075 ml  Output   4200 ml  Net  -2125 ml    Exam:  General: Alert and oriented in no apparent distress Cardiovascular: S1-S2 no murmur rub or gallop Respiratory:  clear no added sounds Abdomen:  soft nontender nondistended no rebound Skin no lower extremity edema Neuro grossly intact  Data Reviewed: Basic Metabolic Panel:  Recent Labs Lab 02/15/14 1725 02/16/14 0700 02/17/14 0534 02/18/14 0553  NA 134* 132* 133* 138  K 4.5 4.1 4.0 4.1  CL 92* 93* 95* 103  CO2 27 25 25 24   GLUCOSE 201* 232* 147* 144*  BUN 25* 32* 26* 15  CREATININE 1.49* 1.62* 1.26* 1.05  CALCIUM 8.9 8.2* 8.0* 8.4   Liver Function Tests:  Recent Labs Lab 02/16/14 0700 02/17/14 0534 02/18/14 0553  AST 28 28 24   ALT 15 14 12   ALKPHOS 90 76 73  BILITOT 0.9 0.8 0.5  PROT 6.3 6.2 6.2  ALBUMIN 2.9* 2.7* 2.7*   No results found for this basename: LIPASE, AMYLASE,  in the last 168 hours No results found for this basename: AMMONIA,  in the last 168 hours CBC:  Recent Labs Lab 02/15/14 1725 02/16/14 0700 02/17/14 0534 02/18/14 0553  WBC 14.0* 16.3* 8.3 5.8  NEUTROABS 12.9*  --  7.0 4.2  HGB 7.1* 8.4* 8.4* 7.8*  HCT  24.4* 27.5* 27.2* 26.2*  MCV 68.7* 72.8* 72.3* 72.6*  PLT 118* 106* 75* 91*   Cardiac Enzymes:  Recent Labs Lab 02/15/14 1725  TROPONINI <0.30   BNP: No components found with this basename: POCBNP,  CBG:  Recent Labs Lab 02/17/14 1145 02/17/14 1631 02/17/14 2134 02/18/14 0733 02/18/14 1112  GLUCAP 186* 211* 219* 133* 198*    Recent Results (from the past 240 hour(s))  URINE CULTURE     Status: None   Collection Time    02/15/14  6:09 PM      Result Value Ref Range Status   Specimen Description URINE, CLEAN CATCH   Final   Special Requests NONE   Final   Culture  Setup Time     Final   Value: 02/15/2014 23:19     Performed at [facility]     Final   Value: >=100,000 COLONIES/ML     Performed at [facility]   Culture     Final   Value: ESCHERICHIA COLI     Performed at [facility]   Report Status 02/17/2014 FINAL   Final   Organism ID, Bacteria ESCHERICHIA COLI   Final  CULTURE, BLOOD (ROUTINE X 2)     Status: None   Collection Time    02/15/14  6:54 PM      Result Value Ref Range Status   Specimen Description BLOOD RIGHT HAND   Final   Special Requests BOTTLES DRAWN AEROBIC AND ANAEROBIC 12CC   Final   Culture NO GROWTH 3 DAYS   Final   Report Status PENDING   Incomplete  CULTURE, BLOOD (ROUTINE X 2)     Status: None   Collection Time    02/15/14  7:00 PM      Result Value Ref Range Status   Specimen Description BLOOD RIGHT HAND   Final   Special Requests     Final   Value: BOTTLES DRAWN AEROBIC AND ANAEROBIC AEB=12CC ANA=8CC   Culture  Setup Time     Final   Value: 02/16/2014 16:57     Performed at [facility]   Culture     Final   Value: ESCHERICHIA COLI     Note: Gram Stain Report Called to,Read Back By and Verified With: NADINE ROWE RN ON [id number] AT [place] R     Performed at [facility]   Report Status 02/18/2014 FINAL   Final   Organism ID, Bacteria ESCHERICHIA COLI   Final  MRSA PCR SCREENING     Status: None   Collection Time    02/15/14  9:10 PM      Result Value Ref Range Status   MRSA by PCR NEGATIVE  NEGATIVE Final   Comment:            The GeneXpert MRSA Assay (FDA     approved for NASAL specimens     only), is one component of a     comprehensive MRSA colonization     surveillance program. It is not     intended to diagnose MRSA     infection nor to guide or     monitor treatment for     MRSA infections.     Studies:              All Imaging reviewed and is as per above notation   Scheduled Meds: . ciprofloxacin  500 mg Oral BID  .  gabapentin  300 mg Oral TID  . insulin aspart  0-20 Units Subcutaneous TID WC  . insulin  aspart  0-5 Units Subcutaneous QHS  . insulin glargine  90 Units Subcutaneous QHS  . levothyroxine  75 mcg Oral QAC breakfast  . pantoprazole  40 mg Oral Daily   Continuous Infusions:     Assessment/Plan:  1. Sepsis likely secondary to pyelonephritis 2/2E.coli-transitioned ceftazidime q 12-to PO cipro based on culture and sensitivities.d/c saline.  CBC plus differential in a.m.  Tylenol for fever 2. 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 Hld Ace inhibitor 3. Cirrhosis as well as splenomegally-differential diagnosis broad but includes potential fatty liver leading to cirrhosis because of habitus, hepatitis [patient had a transfusion in 1984], less likely  HIV, and tumor. Neg acute hepatitis panel, platelets smear gending, hemoglobin electrophoresis-unlikely autoimmune hepatitis, metabolic disease.  INR was 1.15 4. Diabetes mellitus type 2-continue 90 units Lantus each bedtime, resistant sliding scale coverage-blood sugars controlled between 140-198's-Victoza and Canagliflozin-metformin on holdfor now. 5. Microcytic anemia-status post 2 units packed cell transfusion-hemoglobin acceptable went from 7.1-->8.4.  Gastroenterology consulted. Likely iron deficiency anemia based on anemia panel done 02/15/14, however this is not completely clear and electrophoresis is pending--EGD and colonoscopy scheduled for 6/15 am 6. Congestive heart failure-no echocardiogram recorded in system, monitor. Is not on  beta blocker for unclear reason-resuemd lasix 6/14 7. Thrombocytopenia-unclear etiology. Monitor could be secondary to sepsis/cirrhosis 8. Morbid obesity, Body mass index is 38.88 kg/(m^2). patient will require outpatient counseling regarding the same  Code Status: full Family Communication:  Discussed with husband at bedside Disposition Plan:  Inpatient-transfer to MedSurg as hemodynamically stable   Verneita Griffes, MD  [facility] Pager [phone] 02/18/2014, 12:24 PM    LOS: 3  days

## 2014-02-19 ENCOUNTER — Encounter (HOSPITAL_COMMUNITY): Payer: Self-pay | Admitting: *Deleted

## 2014-02-19 ENCOUNTER — Encounter (HOSPITAL_COMMUNITY)
Admission: EM | Disposition: A | Discharge status: Home / Self Care | Payer: Self-pay | Source: Home / Self Care | Attending: Family Medicine

## 2014-02-19 DIAGNOSIS — A4152 Sepsis due to Pseudomonas: Secondary | ICD-10-CM

## 2014-02-19 DIAGNOSIS — D131 Benign neoplasm of stomach: Secondary | ICD-10-CM

## 2014-02-19 DIAGNOSIS — I8500 Esophageal varices without bleeding: Secondary | ICD-10-CM

## 2014-02-19 DIAGNOSIS — K649 Unspecified hemorrhoids: Secondary | ICD-10-CM

## 2014-02-19 HISTORY — PX: COLONOSCOPY: SHX5424

## 2014-02-19 HISTORY — PX: ESOPHAGOGASTRODUODENOSCOPY: SHX5428

## 2014-02-19 LAB — GLUCOSE, CAPILLARY
GLUCOSE-CAPILLARY: 123 mg/dL — AB (ref 70–99)
GLUCOSE-CAPILLARY: 102 mg/dL — AB (ref 70–99)
Glucose-Capillary: 97 mg/dL (ref 70–99)
Glucose-Capillary: 108 mg/dL — ABNORMAL HIGH (ref 70–99)
Glucose-Capillary: 181 mg/dL — ABNORMAL HIGH (ref 70–99)

## 2014-02-19 LAB — CBC WITH DIFFERENTIAL/PLATELET
BASOS ABS: 0 10*3/uL (ref 0.0–0.1)
BASOS PCT: 0 % (ref 0–1)
Eosinophils Absolute: 0.1 10*3/uL (ref 0.0–0.7)
Eosinophils Relative: 3 % (ref 0–5)
HCT: 27.5 % — ABNORMAL LOW (ref 36.0–46.0)
Hemoglobin: 8.2 g/dL — ABNORMAL LOW (ref 12.0–15.0)
Lymphocytes Relative: 22 % (ref 12–46)
Lymphs Abs: 1.1 10*3/uL (ref 0.7–4.0)
MCH: 21.8 pg — ABNORMAL LOW (ref 26.0–34.0)
MCHC: 29.8 g/dL — AB (ref 30.0–36.0)
MCV: 72.9 fL — ABNORMAL LOW (ref 78.0–100.0)
Monocytes Absolute: 0.6 10*3/uL (ref 0.1–1.0)
Monocytes Relative: 13 % — ABNORMAL HIGH (ref 3–12)
NEUTROS ABS: 3 10*3/uL (ref 1.7–7.7)
NEUTROS PCT: 62 % (ref 43–77)
PLATELETS: 106 10*3/uL — AB (ref 150–400)
RBC: 3.77 MIL/uL — ABNORMAL LOW (ref 3.87–5.11)
RDW: 20.8 % — AB (ref 11.5–15.5)
WBC: 4.9 10*3/uL (ref 4.0–10.5)

## 2014-02-19 LAB — COMPREHENSIVE METABOLIC PANEL
ALK PHOS: 78 U/L (ref 39–117)
ALT: 12 U/L (ref 0–35)
AST: 24 U/L (ref 0–37)
Albumin: 2.8 g/dL — ABNORMAL LOW (ref 3.5–5.2)
BILIRUBIN TOTAL: 0.5 mg/dL (ref 0.3–1.2)
BUN: 10 mg/dL (ref 6–23)
CHLORIDE: 101 meq/L (ref 96–112)
CO2: 28 mEq/L (ref 19–32)
Calcium: 8.6 mg/dL (ref 8.4–10.5)
Creatinine, Ser: 1.07 mg/dL (ref 0.50–1.10)
GFR calc Af Amer: 63 mL/min — ABNORMAL LOW (ref >90)
GFR calc non Af Amer: 54 mL/min — ABNORMAL LOW (ref >90)
Glucose, Bld: 92 mg/dL (ref 70–99)
POTASSIUM: 3.9 meq/L (ref 3.7–5.3)
SODIUM: 141 meq/L (ref 137–147)
Total Protein: 6.6 g/dL (ref 6.0–8.3)

## 2014-02-19 SURGERY — EGD (ESOPHAGOGASTRODUODENOSCOPY)
Anesthesia: Moderate Sedation

## 2014-02-19 MED ORDER — PROPRANOLOL HCL 10 MG PO TABS
10.0000 mg | ORAL_TABLET | Freq: Every day | ORAL | Status: DC
Start: 2014-02-19 — End: 2014-04-02

## 2014-02-19 MED ORDER — SODIUM CHLORIDE 0.9 % IV SOLN
INTRAVENOUS | Status: DC
  Administered 2014-02-19: 14:00:00 via INTRAVENOUS

## 2014-02-19 MED ORDER — FUROSEMIDE 40 MG PO TABS
40.0000 mg | ORAL_TABLET | Freq: Once | ORAL | Status: AC
  Administered 2014-02-19: 40 mg via ORAL
  Filled 2014-02-19: qty 1

## 2014-02-19 MED ORDER — MIDAZOLAM HCL 5 MG/5ML IJ SOLN
INTRAMUSCULAR | Status: DC | PRN
  Administered 2014-02-19 (×5): 1 mg via INTRAVENOUS
  Administered 2014-02-19: 2 mg via INTRAVENOUS
  Administered 2014-02-19: 1 mg via INTRAVENOUS

## 2014-02-19 MED ORDER — ONDANSETRON HCL 4 MG/2ML IJ SOLN
INTRAMUSCULAR | Status: AC
  Filled 2014-02-19: qty 2

## 2014-02-19 MED ORDER — LIDOCAINE VISCOUS 2 % MT SOLN
OROMUCOSAL | Status: AC
  Filled 2014-02-19: qty 15

## 2014-02-19 MED ORDER — STERILE WATER FOR IRRIGATION IR SOLN
Status: DC | PRN
  Administered 2014-02-19: 15:00:00

## 2014-02-19 MED ORDER — ONDANSETRON HCL 4 MG/2ML IJ SOLN
INTRAMUSCULAR | Status: DC | PRN
  Administered 2014-02-19: 4 mg via INTRAVENOUS

## 2014-02-19 MED ORDER — MEPERIDINE HCL 100 MG/ML IJ SOLN
INTRAMUSCULAR | Status: AC
  Filled 2014-02-19: qty 2

## 2014-02-19 MED ORDER — MIDAZOLAM HCL 5 MG/5ML IJ SOLN
INTRAMUSCULAR | Status: AC
  Filled 2014-02-19: qty 10

## 2014-02-19 MED ORDER — MEPERIDINE HCL 100 MG/ML IJ SOLN
INTRAMUSCULAR | Status: DC | PRN
  Administered 2014-02-19 (×2): 25 mg via INTRAVENOUS
  Administered 2014-02-19: 50 mg via INTRAVENOUS

## 2014-02-19 MED ORDER — LIDOCAINE VISCOUS 2 % MT SOLN
OROMUCOSAL | Status: DC | PRN
  Administered 2014-02-19: 3 mL via OROMUCOSAL

## 2014-02-19 NOTE — Interval H&P Note (Signed)
History and Physical Interval Note:  02/19/2014 3:18 PM  Penny Martinez  has presented today for surgery, with the diagnosis of Cirrhosis and iron deficiency anemia  The various methods of treatment have been discussed with the patient and family. After consideration of risks, benefits and other options for treatment, the patient has consented to  Procedure(s): ESOPHAGOGASTRODUODENOSCOPY (EGD) (N/A) COLONOSCOPY (N/A) as a surgical intervention .  The patient's history has been reviewed, patient examined, no change in status, stable for surgery.  I have reviewed the patient's chart and labs.  Questions were answered to the patient's satisfaction.     Penny Martinez  No change. EGD and colonoscopy per plan.The risks, benefits, limitations, alternatives and imponderables have been reviewed with the patient. Questions have been answered. All parties are agreeable.

## 2014-02-19 NOTE — H&P (View-Only) (Signed)
Referring Provider: No ref. provider found Primary Care Physician:  No primary provider on file. Primary Gastroenterologist:  Dr. Gala Romney  Reason for Consultation:  Microcytic anemia  HPI:  Pleasant 63 year old lady admitted to the hospital with urosepsis/septic shock. Found to have significant microcytic anemia and thrombocytopenia.  No evidence of GI bleeding, however. She was fluid resuscitated and started on broad-spectrum antibiotics.  Initial hemoglobin and hematocrit 7.1 and 68.7 platelets 118,000 white count 14,000; iron saturation 5% ; TIBC 410, serum iron 19.  Hospitalization thus far has been on remarkable. No evidence GI bleeding. No Hemoccults documented.  Significant urinary retention requiring Foley catheter. CT scan for right upper quadrant fullness revealed a nodular liver along with splenomegaly and early portal venous hypertension consistent with underlying cirrhosis which is apparently a new finding. Patient denies any prior GI evaluation other than a barium enema many, many years ago. No prior EGD or colonoscopy.  ALT, AST, alkaline phosphatase and total bilirubin repeatedly normal this hospitalization.  No prior liver issues and no family history of liver disease.  No high risk behaviors Patient is essentially devoid of any GI symptoms including nausea vomiting, GERD, odynophagia, dysphagia, early satiety, weight loss, abdominal pain or change in bowel function. Specifically denies melena or hematochezia.   Past Medical History  Diagnosis Date  . Diabetes mellitus without complication   . COPD (chronic obstructive pulmonary disease)   . Hypertension   . Hypercholesteremia   . Acid reflux   . IBS (irritable bowel syndrome)   . CHF (congestive heart failure)     Past Surgical History  Procedure Laterality Date  . 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 Abdominal hysterectomy    . Cholecystectomy    . Appendectomy      Prior to Admission  medications   Medication Sig Start Date End Date Taking? Authorizing Provider  benazepril (LOTENSIN) 20 MG tablet Take 20 mg by mouth daily.   Yes Historical Provider, MD  Canagliflozin-Metformin HCl (INVOKAMET) 50-1000 MG TABS Take 1 tablet by mouth 2 (two) times daily.   Yes Historical Provider, MD  furosemide (LASIX) 80 MG tablet Take 80 mg by mouth daily.   Yes Historical Provider, MD  gabapentin (NEURONTIN) 300 MG capsule Take 300 mg by mouth 3 (three) times daily.   Yes Historical Provider, MD  insulin aspart (NOVOLOG) 100 UNIT/ML injection Inject 15 Units into the skin 3 (three) times daily before meals.    Yes Historical Provider, MD  insulin glargine (LANTUS) 100 UNIT/ML injection Inject 90 Units into the skin at bedtime.   Yes Historical Provider, MD  lansoprazole (PREVACID) 30 MG capsule Take 30 mg by mouth daily.   Yes Historical Provider, MD  levothyroxine (SYNTHROID, LEVOTHROID) 75 MCG tablet Take 75 mcg by mouth daily before breakfast.   Yes Historical Provider, MD  Liraglutide (VICTOZA) 18 MG/3ML SOPN Inject 1.8 mg into the skin daily.    Yes Historical Provider, MD  potassium chloride (K-DUR) 10 MEQ tablet Take 10 mEq by mouth daily.   Yes Historical Provider, MD    Current Facility-Administered Medications  Medication Dose Route Frequency Provider Last Rate Last Dose  . 0.9 %  sodium chloride infusion   Intravenous Continuous Nimish C Gosrani, MD 100 mL/hr at 02/17/14 0600    . cefTAZidime (FORTAZ) 2 g in dextrose 5 % 50 mL IVPB  2 g Intravenous Q12H Raylene Miyamoto, MD   2 g at 02/17/14 0900  . gabapentin (NEURONTIN)  capsule 300 mg  300 mg Oral TID Dianne Dun, NP   300 mg at 02/17/14 0900  . guaiFENesin-dextromethorphan (ROBITUSSIN DM) 100-10 MG/5ML syrup 5 mL  5 mL Oral Q4H PRN Dianne Dun, NP   5 mL at 02/16/14 2151  . heparin injection 5,000 Units  5,000 Units Subcutaneous 3 times per day Doree Albee, MD   5,000 Units at 02/17/14 0541  .  insulin aspart (novoLOG) injection 0-20 Units  0-20 Units Subcutaneous TID WC Doree Albee, MD   3 Units at 02/17/14 0755  . insulin aspart (novoLOG) injection 0-5 Units  0-5 Units Subcutaneous QHS Doree Albee, MD   2 Units at 02/15/14 2201  . insulin glargine (LANTUS) injection 90 Units  90 Units Subcutaneous QHS Doree Albee, MD   90 Units at 02/16/14 2233  . ondansetron (ZOFRAN) tablet 4 mg  4 mg Oral Q6H PRN Nimish C Gosrani, MD       Or  . ondansetron (ZOFRAN) injection 4 mg  4 mg Intravenous Q6H PRN Nimish C Gosrani, MD      . pantoprazole (PROTONIX) EC tablet 40 mg  40 mg Oral Daily Dianne Dun, NP   40 mg at 02/17/14 0900    Allergies as of 02/15/2014 - Review Complete 02/15/2014  Allergen Reaction Noted  . Erythromycin  02/15/2014  . Penicillins  02/15/2014  . Sulfa antibiotics  02/15/2014    History reviewed. No pertinent family history.  History   Social History  . Marital Status: Married    Spouse Name: N/A    Number of Children: N/A  . Years of Education: N/A   Occupational History  . Not on file.   Social History Main Topics  . Smoking status: Former Research scientist (life sciences)  . Smokeless tobacco: Former Systems developer  . Alcohol Use: No  . Drug Use: No  . Sexual Activity: Not Currently   Other Topics Concern  . Not on file   Social History Narrative  . No narrative on file    Review of Systems: Gen: Denies weight loss, and sleep disorder CV: Denies chest pain, angina, palpitations, syncope, orthopnea, PND, peripheral edema, and claudication. GI: Denies vomiting blood, jaundice, and fecal incontinence.   Denies dysphagia or odynophagia. Derm: Denies rash, itching, dry skin, hives, moles, warts, or unhealing ulcers.  Psych: Denies depression, anxiety, memory loss, suicidal ideation, hallucinations, paranoia, and confusion. Heme: Denies bruising, bleeding, and enlarged lymph nodes.   Physical Exam: Vital signs in last 24 hours: Temp:  [98.1 F (36.7  C)-99.4 F (37.4 C)] 99.4 F (37.4 C) (06/13 0800) Pulse Rate:  [66-86] 81 (06/13 0630) Resp:  [12-22] 16 (06/13 0630) BP: (90-138)/(27-62) 119/58 mmHg (06/13 0630) SpO2:  [87 %-100 %] 94 % (06/13 0630) Last BM Date: 02/15/14 General:   Alert, conversant and appears to be in no acute distress. Accompanied by her husband in the ICU room. Head:  Normocephalic and atraumatic. Mouth:  No deformity or lesions, dentition normal. Neck:  Supple; no masses or thyromegaly. Lungs:  Clear throughout to auscultation.   No wheezes, crackles, or rhonchi. No acute distress. Heart:  Regular rate and rhythm; no murmurs, clicks, rubs,  or gallops. Abdomen: Obese.   Positive bowel sounds. Soft and nontender. Liver edge appreciated at 4 fingerbreadths below the right costal margin margin. Spleen is ballotable. Msk:  Symmetrical without gross deformities. Normal posture. Pulses:  Normal pulses noted. Extremities:  Without clubbing or edema. Neurologic:  Alert and  oriented x4;  grossly normal neurologically. Skin:  Intact without significant lesions or rashes.  Intake/Output from previous day: 06/12 0701 - 06/13 0700 In: 2550 [I.V.:2500; IV Piggyback:50] Out: 1600 [Urine:1600] Intake/Output this shift: Total I/O In: -  Out: 825 [Urine:825]  Lab Results:  Recent Labs  02/15/14 1725 02/16/14 0700 02/17/14 0534  WBC 14.0* 16.3* 8.3  HGB 7.1* 8.4* 8.4*  HCT 24.4* 27.5* 27.2*  PLT 118* 106* 75*   BMET  Recent Labs  02/15/14 1725 02/16/14 0700 02/17/14 0534  NA 134* 132* 133*  K 4.5 4.1 4.0  CL 92* 93* 95*  CO2 27 25 25   GLUCOSE 201* 232* 147*  BUN 25* 32* 26*  CREATININE 1.49* 1.62* 1.26*  CALCIUM 8.9 8.2* 8.0*   LFT  Recent Labs  02/17/14 0534  PROT 6.2  ALBUMIN 2.7*  AST 28  ALT 14  ALKPHOS 76  BILITOT 0.8  Studies/Results: Ct Abdomen Pelvis W Contrast  02/16/2014   CLINICAL DATA:  Fever, right upper quadrant pain, diarrhea, nausea  EXAM: CT ABDOMEN AND PELVIS WITH  CONTRAST  TECHNIQUE: Multidetector CT imaging of the abdomen and pelvis was performed using the standard protocol following bolus administration of intravenous contrast.  CONTRAST:  76m OMNIPAQUE IOHEXOL 300 MG/ML SOLN, 870mOMNIPAQUE IOHEXOL 300 MG/ML SOLN  COMPARISON:  None.  FINDINGS: Lung bases are unremarkable.  Mild posterior basilar atelectasis.  Sagittal images of the spine shows multilevel degenerative changes thoracolumbar spine. Extensive atherosclerotic calcifications of abdominal aorta and iliac arteries. No aortic aneurysm. There is subtle nodular contour of the liver. Clinical correlation is necessary to exclude cirrhosis.  Splenomegaly is noted.  The spleen measures 16 cm in length.  Portal vein measures 1.4 cm in diameter.  Atherosclerotic calcifications of coronary arteries. No aortic aneurysm.  There is a low-density nodular thickening of left adrenal gland measures 1.8 cm. This is suspicious for adrenal adenoma.  The kidneys show symmetrical enhancement. Nonspecific mild right perinephric and proximal right periureteral stranding.  Delayed renal images shows persistent parenchymal enhancement with delay excretion of contrast material. Findings are suspicious for renal failure/insufficiency or medical renal disease. Clinical correlation is necessary. No retroperitoneal adenopathy. No mesenteric fluid collection. Small hiatal hernia.  Distended urinary bladder is noted. There is mild skin thickening and stranding subcutaneous fat in anterior pelvic wall. This may be due to subcutaneous injections or mild cellulitis. Clinical correlation is necessary. There is no abdominal or pelvic ascites. No small bowel obstruction.  No pericecal inflammation. The patient is status post appendectomy. Status postcholecystectomy. Status post hysterectomy. No inguinal adenopathy. No destructive bony lesions are noted within pelvis.  IMPRESSION: 1. There is subtle micronodular contour of the liver. Clinical  correlation is necessary to exclude early cirrhosis. Splenomegaly is noted. The spleen measures 16 cm in length. Portal vein measures 1.4 cm in diameter suspicious for portal hypertension. 2. There is bilateral renal delay excretion with persistent parenchymal enhancement on delayed images suspicious for renal insufficiency or medical renal disease. Clinical correlation is necessary. Nonspecific mild right perinephric and right proximal periureteral stranding. No calcified ureteral calculi are noted. 3. No small bowel or colonic obstruction. 4. Degenerative changes thoracolumbar spine. 5. Status post hysterectomy, status post appendectomy. Status post cholecystectomy. 6. Extensive atherosclerotic calcifications abdominal aorta and iliac arteries. 7. Moderate distended urinary bladder. No calcified calculi are noted within urinary bladder. 8. There is mild skin thickening and subcutaneous stranding anterior pelvic wall. This may be due to subcutaneous injections or mild cellulitis. Clinical correlation is necessary. These results  were called by telephone at the time of interpretation on 02/16/2014 at 10:24 AM to Dr. Hurshel Party , who verbally acknowledged these results.   Electronically Signed   By: Lahoma Crocker M.D.   On: 02/16/2014 10:25   Dg Chest Port 1 View  02/15/2014   CLINICAL DATA:  Fever. Weakness. Vomiting. Diarrhea. Chest discomfort.  EXAM: PORTABLE CHEST - 1 VIEW  COMPARISON:  None.  FINDINGS: The cardiopericardial silhouette is enlarged. Pulmonary vascular congestion is present with cephalization of pulmonary blood flow. Basilar atelectasis. Evaluation of the bases is suboptimal due to body habitus and underpenetration from portable technique. No pleural effusions. No gross consolidation.  IMPRESSION: Cardiomegaly and pulmonary vascular congestion. No focal consolidation.   Electronically Signed   By: Dereck Ligas M.D.   On: 02/15/2014 17:45   Impression:  Pleasant obese 63 year old lady with  diabetes admitted to the  hospital with urinary retention, urosepsis and profound microcytic anemia. No evidence of GI bleeding at this time. CT suggestive of well-established cirrhosis. No prior EGD or colonoscopy. Thrombocytopenia likely secondary to splenic sequestration secondary to cirrhosis. Etiology of cirrhosis more likely secondary to NASH in the setting of obesity and relatively poorly controlled diabetes. Need to screen for chronic viral hepatitis. I doubt autoimmune liver disease or other relatively rare disorders such as a Wilson's disease, alpha-1 antitrypsin deficiency, etc.  Given the patient is being treated in the ICU for urosepsis and there is no evidence of GI bleeding, urgent endoscopic evaluation is not warranted at this time.  Recommendations:    Follow hemoglobin. Watch for signs of GI bleeding particularly with thrombocytopenia and ongoing treatment with heparin for DVT prophylaxis.   Agree with PPI empirically for now.  As long as Hemoglobin remains in the 8 Range, would not plan to transfuse. Eventually, patient needs to have a diagnostic colonoscopy as well as an EGD at least, in part, to screen for esophageal varices. Needs to be screened for chronic hepatitis B. and C as well.  Also, immune status needs to be determined regarding hepatitis A and B. She may need to be vaccinated. Hepatoma screening via ultrasound every 6 months also needed. Thanks for consult.  Will follow with you.     Notice:  This dictation was prepared with Dragon dictation along with smaller phrase technology. Any transcriptional errors that result from this process are unintentional and may not be corrected upon review.

## 2014-02-19 NOTE — Op Note (Signed)
[facility] [address] [place], [address]   COLONOSCOPY PROCEDURE REPORT  PATIENT: Penny Martinez, Penny Martinez  MR#:         [id number] BIRTHDATE: [birth year] , 67  yrs. old GENDER: Female ENDOSCOPIST: R.  Garfield Cornea, MD FACP [facility] REFERRED BY:     none PROCEDURE DATE:  02/19/2014 PROCEDURE:     Colonoscopy-diagnostic  INDICATIONS: Iron deficiency anemia  INFORMED CONSENT:  The risks, benefits, alternatives and imponderables including but not limited to bleeding, perforation as well as the possibility of a missed lesion have been reviewed.  The potential for biopsy, lesion removal, etc. have also been discussed.  Questions have been answered.  All parties agreeable. Please see the history and physical in the medical record for more information.  MEDICATIONS: Versed 8 mg IV and Demerol 100 mg IV in divided doses. Zofran 4 mg IV  DESCRIPTION OF PROCEDURE:  After a digital rectal exam was performed, the EC-[id number] ([id number])  colonoscope was advanced from the anus through the rectum and colon to the area of the cecum, ileocecal valve and appendiceal orifice.  The cecum was deeply intubated.  These structures were well-seen and photographed for the record.  From the level of the cecum and ileocecal valve, the scope was slowly and cautiously withdrawn.  The mucosal surfaces were carefully surveyed utilizing scope tip deflection to facilitate fold flattening as needed.  The scope was pulled down into the rectum where a thorough examination including retroflexion was performed.    FINDINGS:  Adequate preparation.  Rectal varices present;  (1)  tiny hyperplastic-appearing polyp overlying one of the varices-not manipulated; otherwise, normal rectum. Normal-appearing colonic mucosa.  THERAPEUTIC / DIAGNOSTIC MANEUVERS PERFORMED:  none  COMPLICATIONS: None  CECAL WITHDRAWAL TIME:  13 minutes  IMPRESSION:  Rectal varices. Tiny benign-appearing rectal polyp - not  manipulated (was overlying a varix); otherwise negative colonoscopy  RECOMMENDATIONS: Advance diet. Outpatient cirrhosis care. Outpatient capsule study of  the small intestine.  See EGD report.   _______________________________ eSigned:  R. Garfield Cornea, MD FACP [facility] 02/19/2014 4:37 PM   CC:    PATIENT NAME:  Penny Martinez, Penny Martinez MR#: [id number]

## 2014-02-19 NOTE — Progress Notes (Signed)
Note: This document was prepared with digital dictation and possible smart phrase technology. Any transcriptional errors that result from this process are unintentional.   Penny Martinez QAS:[id number] DOB: 1951-02-01 DOA: 02/15/2014 PCP: No primary provider on file.  Brief narrative: 63 y/o ?, known h/o DM ty 2 diagnosed before age 29 ,? 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 She states that she was well until [facility] Day when she went with her family to the beach and then started feeling ill. She had fevers and chills and a nonproductive cough and she felt was bronchitis. On 6/11 she felt extremely hot and went to her 70 office and was admitted because of hypotension and fever   She was also found to have microcytic anemia and was transfused 2 U prbc She has not had a history of colonoscopy in the past and has no dark stools or tarry stools.  Blood cultures preliminary grew gram-negative rods CT scan showed cirrhosis and Spelnomegally Underwent colonoscopy/EGD 6/15 See below  Past medical history-As per Problem list Chart reviewed as below-   Consultants:  Gastroenterology  Procedures:  None  Antibiotics:  Ceftriaxone 6/11-6/12  Ceftazadime 6/12   Subjective   Seen in endo.  Doing fair Family bedside   Objective    Interim History:   Telemetry: Sinus rhythm   Objective: Filed Vitals:   02/19/14 1615 02/19/14 1620 02/19/14 1625 02/19/14 1630  BP: 152/75 150/71 153/70   Pulse: 85 85 79 80  Temp:      TempSrc:      Resp: 18 16 15 20   Height:      Weight:      SpO2: 97% 97% 97% 96%    Intake/Output Summary (Last 24 hours) at 02/19/14 1639 Last data filed at 02/19/14 0511  Gross per 24 hour  Intake    200 ml  Output    500 ml  Net   -300 ml    Exam:  General: Alert and  oriented in no apparent distress Cardiovascular: S1-S2 no murmur rub or gallop Respiratory:  clear no added sounds Abdomen:  soft nontender nondistended no rebound Skin no lower extremity edema Neuro grossly intact  Data Reviewed: Basic Metabolic Panel:  Recent Labs Lab 02/15/14 1725 02/16/14 0700 02/17/14 0534 02/18/14 0553 02/19/14 0542  NA 134* 132* 133* 138 141  K 4.5 4.1 4.0 4.1 3.9  CL 92* 93* 95* 103 101  CO2 27 25 25 24 28   GLUCOSE 201* 232* 147* 144* 92  BUN 25* 32* 26* 15 10  CREATININE 1.49* 1.62* 1.26* 1.05 1.07  CALCIUM 8.9 8.2* 8.0* 8.4 8.6   Liver Function Tests:  Recent Labs Lab 02/16/14 0700 02/17/14 0534 02/18/14 0553 02/19/14 0542  AST 28 28 24 24   ALT 15 14 12 12   ALKPHOS 90 76 73 78  BILITOT 0.9 0.8 0.5 0.5  PROT 6.3 6.2 6.2 6.6  ALBUMIN 2.9* 2.7* 2.7* 2.8*   No results found for this basename: LIPASE, AMYLASE,  in the last 168 hours No results found for this basename: AMMONIA,  in the last 168 hours CBC:  Recent Labs Lab 02/15/14 1725 02/16/14 0700 02/17/14 0534 02/18/14 0553 02/19/14 0542  WBC 14.0* 16.3* 8.3 5.8 4.9  NEUTROABS 12.9*  --  7.0 4.2 3.0  HGB 7.1* 8.4* 8.4* 7.8* 8.2*  HCT 24.4* 27.5* 27.2* 26.2* 27.5*  MCV  68.7* 72.8* 72.3* 72.6* 72.9*  PLT 118* 106* 75* 91* 106*   Cardiac Enzymes:  Recent Labs Lab 02/15/14 1725  TROPONINI <0.30   BNP: No components found with this basename: POCBNP,  CBG:  Recent Labs Lab 02/18/14 1642 02/18/14 2105 02/19/14 0731 02/19/14 1141 02/19/14 1422  GLUCAP 134* 139* 97 123* 108*    Recent Results (from the past 240 hour(s))  URINE CULTURE     Status: None   Collection Time    02/15/14  6:09 PM      Result Value Ref Range Status   Specimen Description URINE, CLEAN CATCH   Final   Special Requests NONE   Final   Culture  Setup Time     Final   Value: 02/15/2014 23:19     Performed at [facility]     Final   Value: >=100,000 COLONIES/ML      Performed at [facility]   Culture     Final   Value: ESCHERICHIA COLI     Performed at [facility]   Report Status 02/17/2014 FINAL   Final   Organism ID, Bacteria ESCHERICHIA COLI   Final  CULTURE, BLOOD (ROUTINE X 2)     Status: None   Collection Time    02/15/14  6:54 PM      Result Value Ref Range Status   Specimen Description BLOOD RIGHT HAND   Final   Special Requests BOTTLES DRAWN AEROBIC AND ANAEROBIC 12CC   Final   Culture NO GROWTH 4 DAYS   Final   Report Status PENDING   Incomplete  CULTURE, BLOOD (ROUTINE X 2)     Status: None   Collection Time    02/15/14  7:00 PM      Result Value Ref Range Status   Specimen Description BLOOD RIGHT HAND   Final   Special Requests     Final   Value: BOTTLES DRAWN AEROBIC AND ANAEROBIC AEB=12CC ANA=8CC   Culture  Setup Time     Final   Value: 02/16/2014 16:57     Performed at [facility]   Culture     Final   Value: ESCHERICHIA COLI     Note: Gram Stain Report Called to,Read Back By and Verified With: NADINE ROWE RN ON [id number] AT 0820 BY RESSEGGER R     Performed at [facility]   Report Status 02/18/2014 FINAL   Final   Organism ID, Bacteria ESCHERICHIA COLI   Final  MRSA PCR SCREENING     Status: None   Collection Time    02/15/14  9:10 PM      Result Value Ref Range Status   MRSA by PCR NEGATIVE  NEGATIVE Final   Comment:            The GeneXpert MRSA Assay (FDA     approved for NASAL specimens     only), is one component of a     comprehensive MRSA colonization     surveillance program. It is not     intended to diagnose MRSA     infection nor to guide or     monitor treatment for     MRSA infections.     Studies:              All Imaging reviewed and is as per above notation   Scheduled Meds: . ciprofloxacin  500 mg Oral BID  . gabapentin  300 mg  Oral TID  . insulin aspart  0-20 Units Subcutaneous TID WC  . insulin aspart  0-5 Units Subcutaneous QHS  . insulin glargine  90  Units Subcutaneous QHS  . levothyroxine  75 mcg Oral QAC breakfast  . lidocaine      . meperidine      . midazolam      . ondansetron      . pantoprazole  40 mg Oral Daily   Continuous Infusions:     Assessment/Plan:  1. Sepsis likely secondary to pyelonephritis 2/2E.coli-transitioned ceftazidime q 12-to PO cipro based on culture and sensitivities. d/c saline.  Complete abx 02/22/14 2. chronic kidney disease stage 3-4-.  Held Ace inhibitor-likely needs other medicine on d/c home 3. Cirrhosis as well as splenomegally-differential diagnosis broad but includes potential fatty liver leading to cirrhosis because of habitus, Neg acute hepatitis panel, platelets smear ending, await hemoglobin electrophoresis-unlikely autoimmune hepatitis, metabolic disease.  INR was 1.15 this admit 4. Diabetes mellitus type 2-continue 90 units Lantus each bedtime, resistant sliding scale coverage-blood sugars controlled between 97-125's-Victoza and Canagliflozin-metformin on hold for now. 5. Microcytic anemia-status post 2 units packed cell transfusion-hemoglobin acceptable went from 7.1-->8.4.  Gastroenterology consulted. Likely iron deficiency anemia based on anemia panel done 02/15/14, however this is not completely clear and electrophoresis is pending--EGD and colonoscopy scheduled for 6/15 am 6. Congestive heart failure-no echocardiogram recorded in system, monitor. Started on Propanolol this admit and resumed lasix 6/14 7. Thrombocytopenia-unclear etiology. Monitor could be secondary to sepsis/cirrhosis 8. Morbid obesity, Body mass index is 38.88 kg/(m^2). patient will require outpatient counseling regarding the same  Code Status: full Family Communication:  Discussed with husband at bedside Disposition Plan:  Inpatient-likely dc home in am 6/16 if all stable   Verneita Griffes, MD  [facility] Pager [phone] 02/19/2014, 4:39 PM    LOS: 4 days

## 2014-02-19 NOTE — Op Note (Signed)
[facility] [address] [place], [address]   ENDOSCOPY PROCEDURE REPORT  PATIENT: Penny Martinez, Penny Martinez  MR#: [id number] BIRTHDATE: [birth year] , 16  yrs. old GENDER: Female ENDOSCOPIST: R.  Garfield Cornea, MD FACP [facility] REFERRED BY:     hospitalist PROCEDURE DATE:  02/19/2014 PROCEDURE:     EGD with gastric biopsy  INDICATIONS:    iron deficiency anemia but no GI bleeding documented.  New diagnosis of cirrhosis on CT; screening for varices   INFORMED CONSENT:   The risks, benefits, limitations, alternatives and imponderables have been discussed.  The potential for biopsy, esophogeal dilation, etc. have also been reviewed.  Questions have been answered.  All parties agreeable.  Please see the history and physical in the medical record for more information.  MEDICATIONS: Versed 4 mg IV and Demerol 50 mg IV in divided doses. Xylocaine gel orally. Zofran 4 mg IV.  DESCRIPTION OF PROCEDURE:   The EG-[id number] ([id number])  endoscope was introduced through the mouth and advanced to the second portion of the duodenum without difficulty or limitations.  The mucosal surfaces were surveyed very carefully during advancement of the scope and upon withdrawal.  Retroflexion view of the proximal stomach and esophagogastric junction was performed.      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 Overlying mucosa otherwise appeared normal. Stomach empty patient.   multiple 1-3 mm hyperplastic-appearing polyps in the fundus and body. There was one 8 mm pedunculated polyp in the fundus.  No portal gastropathy. No ulcer, infiltrating process or gastric varices. Patent pylorus.  Normal first and second portion of the duodenum.  THERAPEUTIC / DIAGNOSTIC MANEUVERS PERFORMED:  The larger pedunculated gastric polyp was biopsied but not completely removed.   COMPLICATIONS:  None  IMPRESSION:  Esophageal varices without bleeding stigmata.  Multiple gastric polyps-largest biopsied  RECOMMENDATIONS:  Begin a nonselective beta blocker such as propranolol or Naldolol as primary prophylaxis against a first variceal hemorrhage. Target heart rate -  55 range. Office followup with [place] for cirrhosis care. Followup on pathology. Check H. pylori serologies.    _______________________________ R. Garfield Cornea, MD FACP [facility] eSigned:  R. Garfield Cornea, MD FACP [facility] 02/19/2014 3:54 PM     CC:  PATIENT NAME:  Penny Martinez, Penny Martinez MR#: [id number]

## 2014-02-20 ENCOUNTER — Telehealth: Payer: Self-pay | Admitting: Gastroenterology

## 2014-02-20 LAB — HEMOGLOBINOPATHY EVALUATION
HGB A2 QUANT: 1.6 % — AB (ref 2.2–3.2)
HGB F QUANT: 0 % (ref 0.0–2.0)
Hemoglobin Other: 0 %
Hgb A: 98.4 % — ABNORMAL HIGH (ref 96.8–97.8)
Hgb S Quant: 0 %

## 2014-02-20 LAB — CBC
HCT: 27.2 % — ABNORMAL LOW (ref 36.0–46.0)
HEMOGLOBIN: 8.1 g/dL — AB (ref 12.0–15.0)
MCH: 22.1 pg — AB (ref 26.0–34.0)
MCHC: 29.8 g/dL — AB (ref 30.0–36.0)
MCV: 74.3 fL — AB (ref 78.0–100.0)
Platelets: 110 10*3/uL — ABNORMAL LOW (ref 150–400)
RBC: 3.66 MIL/uL — AB (ref 3.87–5.11)
RDW: 21 % — ABNORMAL HIGH (ref 11.5–15.5)
WBC: 5.4 10*3/uL (ref 4.0–10.5)

## 2014-02-20 LAB — GLUCOSE, CAPILLARY: Glucose-Capillary: 101 mg/dL — ABNORMAL HIGH (ref 70–99)

## 2014-02-20 LAB — H. PYLORI ANTIBODY, IGG: H Pylori IgG: 0.56 {ISR}

## 2014-02-20 LAB — PATHOLOGIST SMEAR REVIEW

## 2014-02-20 MED ORDER — FERROUS SULFATE 300 (60 FE) MG/5ML PO SYRP: 300.0000 mg | ORAL_SOLUTION | Freq: Two times a day (BID) | ORAL | Status: DC

## 2014-02-20 MED ORDER — HYDROXYZINE HCL 10 MG PO TABS: 10.0000 mg | ORAL_TABLET | Freq: Four times a day (QID) | ORAL | Status: DC | PRN

## 2014-02-20 MED ORDER — CIPROFLOXACIN HCL 500 MG PO TABS: 500.0000 mg | ORAL_TABLET | Freq: Two times a day (BID) | ORAL | Status: DC

## 2014-02-20 MED ORDER — HYDROXYZINE HCL 10 MG PO TABS
10.0000 mg | ORAL_TABLET | Freq: Four times a day (QID) | ORAL | Status: DC | PRN
  Administered 2014-02-20: 10 mg via ORAL
  Filled 2014-02-20: qty 1

## 2014-02-20 NOTE — Discharge Summary (Signed)
Physician Discharge Summary  Penny Martinez FIE:[id number] DOB: [birth year] DOA: 02/15/2014  PCP: No primary provider on file.  Admit date: 02/15/2014 Discharge date: 02/20/2014  Time spent: 45 minutes  Recommendations for Outpatient Follow-up:   1. Complete oral ciprofloxacin on 02/23/14 for Escherichia coli pyelonephritis 2. Outpatient surveillance monitoring in one to 2 months gastroenterology office for ECHO and lower gastrointestinal varices. Continue propanolol 10 mg that was started this admission uptitrate as needed for heart rate goal between 50 and 60 3. Started on iron syrup twice daily for anemia of chronic disease-may require small bowel follow-through or capsule study to determine if there is any other source of bleed 4. Needs counseling and further education about cirrhosis of the liver as was found to have in CT scan abdomen this admission and some discussion has occurred during hospital stay 5. Recommend outpatient management of restless leg syndrome 6. No home health needs identified on discharge 7. Recheck BMET [held ACE this admit and might need to hold if any further Rise creat] 8. Follow H. Pylori, peripheral Smear as well as Hb electrophoresis  Discharge Diagnoses:  Active Problems:   Sepsis   Microcytic anemia   HTN (hypertension)   DM (diabetes mellitus)   CHF (congestive heart failure)   CKD (chronic kidney disease)   UTI (lower urinary tract infection)   Splenomegaly, congestive, chronic   Discharge Condition: Good  Diet recommendation: Low-salt heart healthy  Filed Weights   02/15/14 1711 02/16/14 0500  Weight: 90.719 kg (200 lb) 93.3 kg (205 lb 11 oz)    History of present illness:  63 y/o ?, known h/o DM ty 2 diagnosed before age 89 ,? 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  She states  that she was well until [facility] when she went with her family to the beach and then started feeling ill.  She had fevers and chills and a nonproductive cough and she felt was bronchitis.  On 6/11 she felt extremely hot and went to her 60 office and was admitted because of hypotension and fever  She was also found to have microcytic anemia and was transfused 2 U prbc  She has not had a history of colonoscopy in the past and has no dark stools or tarry stools.  Blood cultures preliminary grew gram-negative rods  CT scan showed cirrhosis and Spelnomegally See below for further input  Hospital Course:   1. Sepsis likely secondary to pyelonephritis 2/2E.coli-transitioned ceftazidime q 12-to PO cipro 500 mg based on culture and sensitivities.  d/c saline. CBC plus differential in a.m. Tylenol for fever 2. 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 Hold Ace inhibitor during hospital and re-started-re-check labs in OP setting 3. Cirrhosis as well as splenomegally-differential diagnosis broad but includes potential fatty liver leading to cirrhosis because of habitus, hepatitis [patient had a transfusion in 1984], less likely HIV, and tumor. Neg acute hepatitis panel, platelets smear gending, hemoglobin electrophoresis-unlikely autoimmune hepatitis, metabolic disease. INR was 1.15 4. Diabetes mellitus type 2-continue 90 units Lantus each bedtime, resistant sliding scale coverage-blood sugars controlled between 140-198's-Victoza and Canagliflozin-metformin held during admission and re-started on d/c home 5. Microcytic anemia-status post 2 units packed cell transfusion-hemoglobin acceptable went from 7.1-->8.4. Gastroenterology consulted. Likely iron deficiency anemia based on anemia panel done 02/15/14, however this is not completely clear and electrophoresis is pending--EGD and colonoscopy showed varices without evidences  of bleed 6/15.  Will dose Iron syrup on d/c home bid and needs rpt  labs as OP 6. Congestive heart failure-no echocardiogram recorded in system, monitor.Started Propranolol 10 daily in hospital- might need increase as OP- resumed lasix 6/14 7. Thrombocytopenia-unclear etiology. Secondary to sepsis/cirrhosis 8. Morbid obesity, Body mass index is 38.88 kg/(m^2). patient will require outpatient counseling regarding the same   Consultants:  Gastroenterology Procedures:  None Antibiotics:  Ceftriaxone 6/11-6/12  Ceftazadime 6/12-6/14 Ciprofloxacin 6/14-6/19    Discharge Exam: Filed Vitals:   02/19/14 1646  BP: 147/65  Pulse: 75  Temp: 97.4 F (36.3 C)  Resp: 20    General: alert pleasant orietned in NAD Cardiovascular: s1 s 2no m/r/g Respiratory: clear and no added sound  Discharge Instructions You were cared for by a hospitalist during your hospital stay. If you have any questions about your discharge medications or the care you received while you were in the hospital after you are discharged, you can call the unit and asked to speak with the hospitalist on call if the hospitalist that took care of you is not available. Once you are discharged, your primary care physician will handle any further medical issues. Please note that NO REFILLS for any discharge medications will be authorized once you are discharged, as it is imperative that you return to your primary care physician (or establish a relationship with a primary care physician if you do not have one) for your aftercare needs so that they can reassess your need for medications and monitor your lab values.  Discharge Instructions   Diet - low sodium heart healthy    Complete by:  As directed      Discharge instructions    Complete by:  As directed   followup with gastroenterology in about 6-7 month You do have have cirrhosis of the liver and this will need to be discussed with her in detail Please discuss your insulin regimen and other medications with endocrinologist Please get labs in  about one to 2 weeks at her regular doctors office     Increase activity slowly    Complete by:  As directed             Medication List         benazepril 20 MG tablet  Commonly known as:  LOTENSIN  Take 20 mg by mouth daily.     furosemide 80 MG tablet  Commonly known as:  LASIX  Take 80 mg by mouth daily.     gabapentin 300 MG capsule  Commonly known as:  NEURONTIN  Take 300 mg by mouth 3 (three) times daily.     insulin aspart 100 UNIT/ML injection  Commonly known as:  novoLOG  Inject 15 Units into the skin 3 (three) times daily before meals.     INVOKAMET 50-1000 MG Tabs  Generic drug:  Canagliflozin-Metformin HCl  Take 1 tablet by mouth 2 (two) times daily.     lansoprazole 30 MG capsule  Commonly known as:  PREVACID  Take 30 mg by mouth daily.     LANTUS 100 UNIT/ML injection  Generic drug:  insulin glargine  Inject 90 Units into the skin at bedtime.     levothyroxine 75 MCG tablet  Commonly known as:  SYNTHROID, LEVOTHROID  Take 75 mcg by mouth daily before breakfast.     potassium chloride 10 MEQ tablet  Commonly known as:  K-DUR  Take 10 mEq by mouth daily.     propranolol 10 MG  tablet  Commonly known as:  INDERAL  Take 1 tablet (10 mg total) by mouth daily.     VICTOZA 18 MG/3ML Sopn  Generic drug:  Liraglutide  Inject 1.8 mg into the skin daily.       Allergies  Allergen Reactions  . Erythromycin   . Penicillins   . Sulfa Antibiotics       The results of significant diagnostics from this hospitalization (including imaging, microbiology, ancillary and laboratory) are listed below for reference.    Significant Diagnostic Studies: Ct Abdomen Pelvis W Contrast  02/16/2014   CLINICAL DATA:  Fever, right upper quadrant pain, diarrhea, nausea  EXAM: CT ABDOMEN AND PELVIS WITH CONTRAST  TECHNIQUE: Multidetector CT imaging of the abdomen and pelvis was performed using the standard protocol following bolus administration of intravenous contrast.   CONTRAST:  33m OMNIPAQUE IOHEXOL 300 MG/ML SOLN, 812mOMNIPAQUE IOHEXOL 300 MG/ML SOLN  COMPARISON:  None.  FINDINGS: Lung bases are unremarkable.  Mild posterior basilar atelectasis.  Sagittal images of the spine shows multilevel degenerative changes thoracolumbar spine. Extensive atherosclerotic calcifications of abdominal aorta and iliac arteries. No aortic aneurysm. There is subtle nodular contour of the liver. Clinical correlation is necessary to exclude cirrhosis.  Splenomegaly is noted.  The spleen measures 16 cm in length.  Portal vein measures 1.4 cm in diameter.  Atherosclerotic calcifications of coronary arteries. No aortic aneurysm.  There is a low-density nodular thickening of left adrenal gland measures 1.8 cm. This is suspicious for adrenal adenoma.  The kidneys show symmetrical enhancement. Nonspecific mild right perinephric and proximal right periureteral stranding.  Delayed renal images shows persistent parenchymal enhancement with delay excretion of contrast material. Findings are suspicious for renal failure/insufficiency or medical renal disease. Clinical correlation is necessary. No retroperitoneal adenopathy. No mesenteric fluid collection. Small hiatal hernia.  Distended urinary bladder is noted. There is mild skin thickening and stranding subcutaneous fat in anterior pelvic wall. This may be due to subcutaneous injections or mild cellulitis. Clinical correlation is necessary. There is no abdominal or pelvic ascites. No small bowel obstruction.  No pericecal inflammation. The patient is status post appendectomy. Status postcholecystectomy. Status post hysterectomy. No inguinal adenopathy. No destructive bony lesions are noted within pelvis.  IMPRESSION: 1. There is subtle micronodular contour of the liver. Clinical correlation is necessary to exclude early cirrhosis. Splenomegaly is noted. The spleen measures 16 cm in length. Portal vein measures 1.4 cm in diameter suspicious for portal  hypertension. 2. There is bilateral renal delay excretion with persistent parenchymal enhancement on delayed images suspicious for renal insufficiency or medical renal disease. Clinical correlation is necessary. Nonspecific mild right perinephric and right proximal periureteral stranding. No calcified ureteral calculi are noted. 3. No small bowel or colonic obstruction. 4. Degenerative changes thoracolumbar spine. 5. Status post hysterectomy, status post appendectomy. Status post cholecystectomy. 6. Extensive atherosclerotic calcifications abdominal aorta and iliac arteries. 7. Moderate distended urinary bladder. No calcified calculi are noted within urinary bladder. 8. There is mild skin thickening and subcutaneous stranding anterior pelvic wall. This may be due to subcutaneous injections or mild cellulitis. Clinical correlation is necessary. These results were called by telephone at the time of interpretation on 02/16/2014 at 10:24 AM to Dr. NIHurshel Party who verbally acknowledged these results.   Electronically Signed   By: LiLahoma Crocker.D.   On: 02/16/2014 10:25   Dg Chest Port 1 View  02/15/2014   CLINICAL DATA:  Fever. Weakness. Vomiting. Diarrhea. Chest discomfort.  EXAM: PORTABLE CHEST -  1 VIEW  COMPARISON:  None.  FINDINGS: The cardiopericardial silhouette is enlarged. Pulmonary vascular congestion is present with cephalization of pulmonary blood flow. Basilar atelectasis. Evaluation of the bases is suboptimal due to body habitus and underpenetration from portable technique. No pleural effusions. No gross consolidation.  IMPRESSION: Cardiomegaly and pulmonary vascular congestion. No focal consolidation.   Electronically Signed   By: Dereck Ligas M.D.   On: 02/15/2014 17:45    Microbiology: Recent Results (from the past 240 hour(s))  URINE CULTURE     Status: None   Collection Time    02/15/14  6:09 PM      Result Value Ref Range Status   Specimen Description URINE, CLEAN CATCH   Final    Special Requests NONE   Final   Culture  Setup Time     Final   Value: 02/15/2014 23:19     Performed at [place]     Final   Value: >=100,000 COLONIES/ML     Performed at [facility]   Culture     Final   Value: ESCHERICHIA COLI     Performed at [facility]   Report Status 02/17/2014 FINAL   Final   Organism ID, Bacteria ESCHERICHIA COLI   Final  CULTURE, BLOOD (ROUTINE X 2)     Status: None   Collection Time    02/15/14  6:54 PM      Result Value Ref Range Status   Specimen Description BLOOD RIGHT HAND   Final   Special Requests BOTTLES DRAWN AEROBIC AND ANAEROBIC 12CC   Final   Culture NO GROWTH 4 DAYS   Final   Report Status PENDING   Incomplete  CULTURE, BLOOD (ROUTINE X 2)     Status: None   Collection Time    02/15/14  7:00 PM      Result Value Ref Range Status   Specimen Description BLOOD RIGHT HAND   Final   Special Requests     Final   Value: BOTTLES DRAWN AEROBIC AND ANAEROBIC AEB=12CC ANA=8CC   Culture  Setup Time     Final   Value: 02/16/2014 16:57     Performed at [facility]   Culture     Final   Value: ESCHERICHIA COLI     Note: Gram Stain Report Called to,Read Back By and Verified With: NADINE ROWE RN ON [id number] AT 0820 BY RESSEGGER R     Performed at [facility]   Report Status 02/18/2014 FINAL   Final   Organism ID, Bacteria ESCHERICHIA COLI   Final  MRSA PCR SCREENING     Status: None   Collection Time    02/15/14  9:10 PM      Result Value Ref Range Status   MRSA by PCR NEGATIVE  NEGATIVE Final   Comment:            The GeneXpert MRSA Assay (FDA     approved for NASAL specimens     only), is one component of a     comprehensive MRSA colonization     surveillance program. It is not     intended to diagnose MRSA     infection nor to guide or     monitor treatment for     MRSA infections.     Labs: Basic Metabolic Panel:  Recent Labs Lab 02/15/14 1725 02/16/14 0700 02/17/14 0534  02/18/14 0553 02/19/14 0542  NA 134* 132* 133*  138 141  K 4.5 4.1 4.0 4.1 3.9  CL 92* 93* 95* 103 101  CO2 27 25 25 24 28   GLUCOSE 201* 232* 147* 144* 92  BUN 25* 32* 26* 15 10  CREATININE 1.49* 1.62* 1.26* 1.05 1.07  CALCIUM 8.9 8.2* 8.0* 8.4 8.6   Liver Function Tests:  Recent Labs Lab 02/16/14 0700 02/17/14 0534 02/18/14 0553 02/19/14 0542  AST 28 28 24 24   ALT 15 14 12 12   ALKPHOS 90 76 73 78  BILITOT 0.9 0.8 0.5 0.5  PROT 6.3 6.2 6.2 6.6  ALBUMIN 2.9* 2.7* 2.7* 2.8*   No results found for this basename: LIPASE, AMYLASE,  in the last 168 hours No results found for this basename: AMMONIA,  in the last 168 hours CBC:  Recent Labs Lab 02/15/14 1725 02/16/14 0700 02/17/14 0534 02/18/14 0553 02/19/14 0542 02/20/14 0527  WBC 14.0* 16.3* 8.3 5.8 4.9 5.4  NEUTROABS 12.9*  --  7.0 4.2 3.0  --   HGB 7.1* 8.4* 8.4* 7.8* 8.2* 8.1*  HCT 24.4* 27.5* 27.2* 26.2* 27.5* 27.2*  MCV 68.7* 72.8* 72.3* 72.6* 72.9* 74.3*  PLT 118* 106* 75* 91* 106* 110*   Cardiac Enzymes:  Recent Labs Lab 02/15/14 1725  TROPONINI <0.30   BNP: BNP (last 3 results)  Recent Labs  02/15/14 1726  PROBNP 277.5*   CBG:  Recent Labs Lab 02/19/14 1141 02/19/14 1422 02/19/14 1706 02/19/14 2216 02/20/14 0732  GLUCAP 123* 108* 102* 181* 101*       Signed:  Nita Sells  [facility] 02/20/2014, 9:15 AM

## 2014-02-20 NOTE — Progress Notes (Signed)
Patient states understanding of discharge instructions.  

## 2014-02-20 NOTE — Telephone Encounter (Signed)
Please set up for a hospital follow-up in about 4-6 weeks. Will likely need capsule study. Also needs cirrhosis care.

## 2014-02-21 LAB — CULTURE, BLOOD (ROUTINE X 2): CULTURE: NO GROWTH

## 2014-02-22 ENCOUNTER — Encounter: Payer: Self-pay | Admitting: Internal Medicine

## 2014-02-23 ENCOUNTER — Telehealth: Payer: Self-pay

## 2014-02-23 NOTE — Telephone Encounter (Signed)
Letter from: Daneil Dolin  Reason for Letter: Results Review  Send letter to patient.  Send copy of letter with path to referring provider and PCP.  Patient needs a followup appointment with extender in regards to cirrhosis care in the coming weeks.

## 2014-02-23 NOTE — Telephone Encounter (Signed)
Letter mailed to pt.  

## 2014-02-23 NOTE — Telephone Encounter (Signed)
Results Cc to PCP  

## 2014-02-26 ENCOUNTER — Encounter (HOSPITAL_COMMUNITY): Payer: Self-pay | Admitting: Internal Medicine

## 2014-02-26 NOTE — Telephone Encounter (Signed)
Patient has an appt to see Vicente Males 7/27 at 1:30 pm

## 2014-02-26 NOTE — Telephone Encounter (Signed)
Pt has an appt to see Vicente Males 7/27 at 1:30 pm

## 2014-03-05 ENCOUNTER — Encounter (HOSPITAL_COMMUNITY): Payer: Self-pay | Admitting: Emergency Medicine

## 2014-03-05 ENCOUNTER — Inpatient Hospital Stay (HOSPITAL_COMMUNITY)
Admission: EM | Admit: 2014-03-05 | Discharge: 2014-03-07 | DRG: 373 | Disposition: A | Discharge status: Home / Self Care | Payer: BC Managed Care – PPO | Attending: Internal Medicine | Admitting: Internal Medicine

## 2014-03-05 ENCOUNTER — Emergency Department (HOSPITAL_COMMUNITY): Payer: BC Managed Care – PPO

## 2014-03-05 DIAGNOSIS — K219 Gastro-esophageal reflux disease without esophagitis: Secondary | ICD-10-CM | POA: Diagnosis present

## 2014-03-05 DIAGNOSIS — D6959 Other secondary thrombocytopenia: Secondary | ICD-10-CM | POA: Diagnosis present

## 2014-03-05 DIAGNOSIS — D638 Anemia in other chronic diseases classified elsewhere: Secondary | ICD-10-CM | POA: Diagnosis present

## 2014-03-05 DIAGNOSIS — I10 Essential (primary) hypertension: Secondary | ICD-10-CM | POA: Diagnosis present

## 2014-03-05 DIAGNOSIS — E869 Volume depletion, unspecified: Secondary | ICD-10-CM | POA: Diagnosis present

## 2014-03-05 DIAGNOSIS — K5289 Other specified noninfective gastroenteritis and colitis: Secondary | ICD-10-CM

## 2014-03-05 DIAGNOSIS — N390 Urinary tract infection, site not specified: Secondary | ICD-10-CM

## 2014-03-05 DIAGNOSIS — Z87440 Personal history of urinary (tract) infections: Secondary | ICD-10-CM

## 2014-03-05 DIAGNOSIS — E119 Type 2 diabetes mellitus without complications: Secondary | ICD-10-CM

## 2014-03-05 DIAGNOSIS — E1142 Type 2 diabetes mellitus with diabetic polyneuropathy: Secondary | ICD-10-CM | POA: Diagnosis present

## 2014-03-05 DIAGNOSIS — D732 Chronic congestive splenomegaly: Secondary | ICD-10-CM

## 2014-03-05 DIAGNOSIS — D509 Iron deficiency anemia, unspecified: Secondary | ICD-10-CM

## 2014-03-05 DIAGNOSIS — E7800 Pure hypercholesterolemia, unspecified: Secondary | ICD-10-CM | POA: Diagnosis present

## 2014-03-05 DIAGNOSIS — R1032 Left lower quadrant pain: Secondary | ICD-10-CM

## 2014-03-05 DIAGNOSIS — R52 Pain, unspecified: Secondary | ICD-10-CM

## 2014-03-05 DIAGNOSIS — E039 Hypothyroidism, unspecified: Secondary | ICD-10-CM | POA: Diagnosis present

## 2014-03-05 DIAGNOSIS — I509 Heart failure, unspecified: Secondary | ICD-10-CM | POA: Diagnosis present

## 2014-03-05 DIAGNOSIS — E1149 Type 2 diabetes mellitus with other diabetic neurological complication: Secondary | ICD-10-CM | POA: Diagnosis present

## 2014-03-05 DIAGNOSIS — N189 Chronic kidney disease, unspecified: Secondary | ICD-10-CM | POA: Diagnosis present

## 2014-03-05 DIAGNOSIS — Z794 Long term (current) use of insulin: Secondary | ICD-10-CM

## 2014-03-05 DIAGNOSIS — N183 Chronic kidney disease, stage 3 (moderate): Secondary | ICD-10-CM

## 2014-03-05 DIAGNOSIS — Z87891 Personal history of nicotine dependence: Secondary | ICD-10-CM

## 2014-03-05 DIAGNOSIS — K589 Irritable bowel syndrome without diarrhea: Secondary | ICD-10-CM | POA: Diagnosis present

## 2014-03-05 DIAGNOSIS — A0472 Enterocolitis due to Clostridium difficile, not specified as recurrent: Principal | ICD-10-CM | POA: Diagnosis present

## 2014-03-05 DIAGNOSIS — J449 Chronic obstructive pulmonary disease, unspecified: Secondary | ICD-10-CM | POA: Diagnosis present

## 2014-03-05 DIAGNOSIS — I129 Hypertensive chronic kidney disease with stage 1 through stage 4 chronic kidney disease, or unspecified chronic kidney disease: Secondary | ICD-10-CM | POA: Diagnosis present

## 2014-03-05 DIAGNOSIS — E1122 Type 2 diabetes mellitus with diabetic chronic kidney disease: Secondary | ICD-10-CM | POA: Diagnosis present

## 2014-03-05 DIAGNOSIS — J4489 Other specified chronic obstructive pulmonary disease: Secondary | ICD-10-CM | POA: Diagnosis present

## 2014-03-05 DIAGNOSIS — K529 Noninfective gastroenteritis and colitis, unspecified: Secondary | ICD-10-CM | POA: Diagnosis present

## 2014-03-05 DIAGNOSIS — K7460 Unspecified cirrhosis of liver: Secondary | ICD-10-CM | POA: Diagnosis present

## 2014-03-05 LAB — COMPREHENSIVE METABOLIC PANEL
ALT: 28 U/L (ref 0–35)
AST: 28 U/L (ref 0–37)
Albumin: 3.8 g/dL (ref 3.5–5.2)
Alkaline Phosphatase: 95 U/L (ref 39–117)
BUN: 25 mg/dL — AB (ref 6–23)
CO2: 28 meq/L (ref 19–32)
Calcium: 9.6 mg/dL (ref 8.4–10.5)
Chloride: 94 mEq/L — ABNORMAL LOW (ref 96–112)
Creatinine, Ser: 1.35 mg/dL — ABNORMAL HIGH (ref 0.50–1.10)
GFR, EST AFRICAN AMERICAN: 48 mL/min — AB (ref >90)
GFR, EST NON AFRICAN AMERICAN: 41 mL/min — AB (ref >90)
Glucose, Bld: 146 mg/dL — ABNORMAL HIGH (ref 70–99)
POTASSIUM: 4.3 meq/L (ref 3.7–5.3)
SODIUM: 136 meq/L — AB (ref 137–147)
Total Bilirubin: 0.6 mg/dL (ref 0.3–1.2)
Total Protein: 7.9 g/dL (ref 6.0–8.3)

## 2014-03-05 LAB — URINALYSIS, ROUTINE W REFLEX MICROSCOPIC
Bilirubin Urine: NEGATIVE
Glucose, UA: NEGATIVE mg/dL
HGB URINE DIPSTICK: NEGATIVE
KETONES UR: NEGATIVE mg/dL
Nitrite: NEGATIVE
PROTEIN: NEGATIVE mg/dL
Specific Gravity, Urine: 1.015 (ref 1.005–1.030)
Urobilinogen, UA: 0.2 mg/dL (ref 0.0–1.0)
pH: 5.5 (ref 5.0–8.0)

## 2014-03-05 LAB — URINE MICROSCOPIC-ADD ON

## 2014-03-05 LAB — CBC WITH DIFFERENTIAL/PLATELET
BASOS ABS: 0 10*3/uL (ref 0.0–0.1)
Basophils Relative: 0 % (ref 0–1)
EOS PCT: 1 % (ref 0–5)
Eosinophils Absolute: 0.1 10*3/uL (ref 0.0–0.7)
HEMATOCRIT: 37.1 % (ref 36.0–46.0)
Hemoglobin: 11.4 g/dL — ABNORMAL LOW (ref 12.0–15.0)
LYMPHS ABS: 2.4 10*3/uL (ref 0.7–4.0)
Lymphocytes Relative: 20 % (ref 12–46)
MCH: 23.7 pg — AB (ref 26.0–34.0)
MCHC: 30.7 g/dL (ref 30.0–36.0)
MCV: 77.1 fL — AB (ref 78.0–100.0)
Monocytes Absolute: 1 10*3/uL (ref 0.1–1.0)
Monocytes Relative: 8 % (ref 3–12)
NEUTROS ABS: 8.7 10*3/uL — AB (ref 1.7–7.7)
Neutrophils Relative %: 71 % (ref 43–77)
PLATELETS: 202 10*3/uL (ref 150–400)
RBC: 4.81 MIL/uL (ref 3.87–5.11)
RDW: 24.5 % — AB (ref 11.5–15.5)
WBC: 12.2 10*3/uL — AB (ref 4.0–10.5)

## 2014-03-05 LAB — I-STAT CG4 LACTIC ACID, ED: LACTIC ACID, VENOUS: 1.21 mmol/L (ref 0.5–2.2)

## 2014-03-05 MED ORDER — IOHEXOL 300 MG/ML  SOLN
50.0000 mL | Freq: Once | INTRAMUSCULAR | Status: AC | PRN
  Administered 2014-03-05: 50 mL via ORAL

## 2014-03-05 MED ORDER — CIPROFLOXACIN IN D5W 400 MG/200ML IV SOLN
400.0000 mg | Freq: Once | INTRAVENOUS | Status: DC
  Filled 2014-03-05: qty 200

## 2014-03-05 MED ORDER — SODIUM CHLORIDE 0.9 % IV BOLUS (SEPSIS)
30.0000 mL/kg | Freq: Once | INTRAVENOUS | Status: AC
  Administered 2014-03-05: 2550 mL via INTRAVENOUS

## 2014-03-05 MED ORDER — METRONIDAZOLE IN NACL 5-0.79 MG/ML-% IV SOLN
500.0000 mg | Freq: Once | INTRAVENOUS | Status: DC
  Filled 2014-03-05: qty 100

## 2014-03-05 MED ORDER — METRONIDAZOLE IN NACL 5-0.79 MG/ML-% IV SOLN
500.0000 mg | Freq: Once | INTRAVENOUS | Status: AC
Start: 2014-03-05 — End: 2014-03-05
  Administered 2014-03-05: 500 mg via INTRAVENOUS

## 2014-03-05 MED ORDER — IOHEXOL 300 MG/ML  SOLN
80.0000 mL | Freq: Once | INTRAMUSCULAR | Status: AC | PRN
  Administered 2014-03-05: 80 mL via INTRAVENOUS

## 2014-03-05 MED ORDER — CIPROFLOXACIN IN D5W 400 MG/200ML IV SOLN
400.0000 mg | Freq: Once | INTRAVENOUS | Status: AC
  Administered 2014-03-05: 400 mg via INTRAVENOUS

## 2014-03-05 MED ORDER — SODIUM CHLORIDE 0.9 % IV SOLN: 1000.0000 mL | INTRAVENOUS | Status: DC

## 2014-03-05 NOTE — ED Notes (Addendum)
Low abd pain , frequent BM's  Feels tired  And bp at home was low. 107/47  Recently here with urosepsis

## 2014-03-05 NOTE — H&P (Signed)
PCP:   ROBERTSON, Thomasena Edis, PA-C   Chief Complaint:  abd pain  HPI: 63 yo female recent hosp 2 weeks ago for urosepsis comes in today for one day of nausea and llq abd pain with a lot of diarrhea.  She started feeling very weak today, and took her bp and it was low with sbp in the 80s she then had her husband drive her to the ED.  Denies any fevers.  No vomiting, but very nauseas.  Diarrhea is diffuse, watery and nonbloody.  Lower abd cramping more in llq.  She was on 2 different abx for her uti 2 weeks ago, one was cipro and the other she does not recall.  Review of Systems:  Positive and negative as per HPI otherwise all other systems are negative  Past Medical History: Past Medical History  Diagnosis Date  . Diabetes mellitus without complication   . COPD (chronic obstructive pulmonary disease)   . Hypertension   . Hypercholesteremia   . Acid reflux   . IBS (irritable bowel syndrome)   . CHF (congestive heart failure)    Past Surgical History  Procedure Laterality Date  . 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 Abdominal hysterectomy    . Cholecystectomy    . Appendectomy    . Esophagogastroduodenoscopy N/A 02/19/2014    Procedure: ESOPHAGOGASTRODUODENOSCOPY (EGD);  Surgeon: Daneil Dolin, MD;  [facility];  Service: Endoscopy;  Laterality: N/A;  . Colonoscopy N/A 02/19/2014    Procedure: COLONOSCOPY;  Surgeon: Daneil Dolin, MD;  Location: [facility];  Service: Endoscopy;  Laterality: N/A;  . Urosepsis      Medications: Prior to Admission medications   Medication Sig Start Date End Date Taking? Authorizing Trivia Heffelfinger  benazepril (LOTENSIN) 20 MG tablet Take 20 mg by mouth daily.   Yes Historical Jona Zappone, MD  ferrous sulfate 325 (65 FE) MG tablet Take 325 mg by mouth 2 (two) times daily.   Yes Historical Emanie Behan, MD  furosemide (LASIX) 80 MG tablet Take 80 mg by mouth daily.   Yes Historical Theone Bowell, MD  gabapentin (NEURONTIN) 300 MG  capsule Take 300 mg by mouth 3 (three) times daily.   Yes Historical Abdirahim Flavell, MD  insulin aspart (NOVOLOG) 100 UNIT/ML injection Inject 30-40 Units into the skin 3 (three) times daily before meals. Based on sliding scale measurements   Yes Historical Shakaria Raphael, MD  insulin glargine (LANTUS) 100 UNIT/ML injection Inject 90 Units into the skin at bedtime.   Yes Historical Mairin Lindsley, MD  lansoprazole (PREVACID) 30 MG capsule Take 30 mg by mouth daily.   Yes Historical Manna Gose, MD  levothyroxine (SYNTHROID, LEVOTHROID) 75 MCG tablet Take 75 mcg by mouth daily before breakfast.   Yes Historical Mena Simonis, MD  Liraglutide (VICTOZA) 18 MG/3ML SOPN Inject 1.8 mg into the skin daily.    Yes Historical Fanta Wimberley, MD  potassium chloride (K-DUR) 10 MEQ tablet Take 10 mEq by mouth daily.   Yes Historical Alicja Everitt, MD  propranolol (INDERAL) 10 MG tablet Take 1 tablet (10 mg total) by mouth daily. 02/19/14  Yes Nita Sells, MD  ciprofloxacin (CIPRO) 500 MG tablet Take 1 tablet (500 mg total) by mouth 2 (two) times daily. 02/20/14   Nita Sells, MD    Allergies:   Allergies  Allergen Reactions  . Niacin Rash and Shortness Of Breath  . Penicillins Shortness Of Breath  . Erythromycin Hives  . Statins     Other reaction(s): Muscle Pain  .  Sulfa Antibiotics     Social History:  reports that she has quit smoking. She has quit using smokeless tobacco. She reports that she does not drink alcohol or use illicit drugs.  Family History: History reviewed. No pertinent family history.  Physical Exam: Filed Vitals:   03/05/14 2130 03/05/14 2138 03/05/14 2200 03/05/14 2242  BP:  135/50 117/43 135/53  Pulse: 81 88 85 85  Temp:      TempSrc:      Resp:    18  Height:      Weight:      SpO2: 97% 97% 96% 96%   General appearance: alert, cooperative and no distress Head: Normocephalic, without obvious abnormality, atraumatic Eyes: negative Nose: Nares normal. Septum midline. Mucosa normal. No  drainage or sinus tenderness. Neck: no JVD and supple, symmetrical, trachea midline Lungs: clear to auscultation bilaterally Heart: regular rate and rhythm, S1, S2 normal, no murmur, click, rub or gallop Abdomen: soft, non-tender; bowel sounds normal; no masses,  no organomegaly Extremities: extremities normal, atraumatic, no cyanosis or edema Pulses: 2+ and symmetric Skin: Skin color, texture, turgor normal. No rashes or lesions Neurologic: Grossly normal  Labs on Admission:   Recent Labs  03/05/14 2053  NA 136*  K 4.3  CL 94*  CO2 28  GLUCOSE 146*  BUN 25*  CREATININE 1.35*  CALCIUM 9.6    Recent Labs  03/05/14 2053  AST 28  ALT 28  ALKPHOS 95  BILITOT 0.6  PROT 7.9  ALBUMIN 3.8    Recent Labs  03/05/14 2053  WBC 12.2*  NEUTROABS 8.7*  HGB 11.4*  HCT 37.1  MCV 77.1*  PLT 202   Radiological Exams on Admission: Dg Chest 2 View  03/05/2014   CLINICAL DATA:  Fever in weakness.  EXAM: CHEST  2 VIEW  COMPARISON:  None.  FINDINGS: The heart size and mediastinal contours are within normal limits. Atherosclerotic disease involves the thoracic aorta. Both lungs are clear. Spondylosis noted within the thoracic spine.  IMPRESSION: 1. No acute cardiopulmonary abnormalities.   Electronically Signed   By: Kerby Moors M.D.   On: 03/05/2014 22:00   Ct Abdomen Pelvis W Contrast  03/05/2014   CLINICAL DATA:  Lower abdominal pain with diarrhea  EXAM: CT ABDOMEN AND PELVIS WITH CONTRAST  TECHNIQUE: Multidetector CT imaging of the abdomen and pelvis was performed using the standard protocol following bolus administration of intravenous contrast.  CONTRAST:  46m OMNIPAQUE IOHEXOL 300 MG/ML SOLN, 815mOMNIPAQUE IOHEXOL 300 MG/ML SOLN  COMPARISON:  02/16/2014  FINDINGS: The lung bases are clear.  No pleural or pericardial effusion.  The liver has a slightly nodular contour. There is no focal liver abnormality identified. Prior cholecystectomy. Normal appearance of the pancreas. The  spleen measures 15 cm in length.  The adrenal glands are both normal. The right kidney appears normal. The left kidney is also normal. The urinary bladder appears normal. Previous hysterectomy.  Calcified atherosclerotic disease involves the abdominal aorta. No aneurysm. There is no upper abdominal adenopathy. No pelvic or inguinal adenopathy noted.  No free fluid or fluid collections within the abdomen or pelvis. The stomach is normal. The small bowel loops have a normal course and caliber. No evidence for bowel obstruction. The proximal colon appears normal. There is mild wall thickening involving the sigmoid colon without significant inflammatory change noted. No free fluid or fluid collections within the abdomen or pelvis.  Review of the visualized osseous structures is significant for mild lumbar spondylosis.  IMPRESSION:  1. Mild wall thickening involving the sigmoid colon without associated inflammation, free fluid or fluid collections. Findings may reflect mild segmental colitis. 2. Atherosclerotic disease. 3. Morphologic features of the liver suggestive of early cirrhosis. 4. Splenomegaly.   Electronically Signed   By: Kerby Moors M.D.   On: 03/05/2014 22:17   Assessment/Plan  63 yo female with acute colitis with recent use of antibiotics for urosepsis (resolved)  Principal Problem:   Acute colitis  Ck stool cx and cdiff pcr.  Place on cipro and flagyl.  abd exam is benign.  ivf overnight.  Full liq diet for now.  Active Problems:  Stable unless o/w noted.   HTN (hypertension)  Hold ace inhibitor due to mild bump in cr.   DM (diabetes mellitus) ssi   CHF (congestive heart failure)  Compensated, hold lasix due to current volume depletion which is mild   CKD (chronic kidney disease)  Mild worse than normal.  Ivf.    Abdominal pain, acute, left lower quadrant  As above.  obs on med.  Full code.  DAVID,RACHAL A 03/05/2014, 10:54 PM

## 2014-03-05 NOTE — ED Provider Notes (Addendum)
CSN: [id number]     Arrival date & time 03/05/14  1935 History   First MD Initiated Contact with Patient 03/05/14 2021     Chief Complaint  Patient presents with  . Hypotension     (Consider location/radiation/quality/duration/timing/severity/associated sxs/prior Treatment) HPI Penny Martinez is a 63 year old female who was discharged about 2 weeks ago after admission for urosepsis. She presents today stating that she has felt generally weak and nauseated since yesterday morning. She states that she has been taking by mouth although not as well as usual. She took her blood pressure at home earlier and it was low and came in secondary to back. She has not noted fever, chills, chest pain, cough, or shortness of breath. She did have some loose bowel movements that were not bloody or dark. She has been taking her medicines as prescribed. Her primary care physician is in [place]. She denies any urinary tract infection symptoms.  Past Medical History  Diagnosis Date  . Diabetes mellitus without complication   . COPD (chronic obstructive pulmonary disease)   . Hypertension   . Hypercholesteremia   . Acid reflux   . IBS (irritable bowel syndrome)   . CHF (congestive heart failure)    Past Surgical History  Procedure Laterality Date  . 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 Abdominal hysterectomy    . Cholecystectomy    . Appendectomy    . Esophagogastroduodenoscopy N/A 02/19/2014    Procedure: ESOPHAGOGASTRODUODENOSCOPY (EGD);  Surgeon: Daneil Dolin, MD;  [facility];  Service: Endoscopy;  Laterality: N/A;  . Colonoscopy N/A 02/19/2014    Procedure: COLONOSCOPY;  Surgeon: Daneil Dolin, MD;  [facility];  Service: Endoscopy;  Laterality: N/A;  . Urosepsis     History reviewed. No pertinent family history. History  Substance Use Topics  . Smoking status: Former Research scientist (life sciences)  . Smokeless tobacco: Former Systems developer  . Alcohol Use: No   OB History   Grav  Para Term Preterm Abortions TAB SAB Ect Mult Living                 Review of Systems  All other systems reviewed and are negative.     Allergies  Erythromycin; Penicillins; and Sulfa antibiotics  Home Medications   Prior to Admission medications   Medication Sig Start Date End Date Taking? Authorizing Provider  benazepril (LOTENSIN) 20 MG tablet Take 20 mg by mouth daily.    Historical Provider, MD  Canagliflozin-Metformin HCl (INVOKAMET) 50-1000 MG TABS Take 1 tablet by mouth 2 (two) times daily.    Historical Provider, MD  ciprofloxacin (CIPRO) 500 MG tablet Take 1 tablet (500 mg total) by mouth 2 (two) times daily. 02/20/14   Nita Sells, MD  ferrous sulfate 300 (60 FE) MG/5ML syrup Take 5 mLs (300 mg total) by mouth 2 (two) times daily with a meal. 02/20/14   Nita Sells, MD  furosemide (LASIX) 80 MG tablet Take 80 mg by mouth daily.    Historical Provider, MD  gabapentin (NEURONTIN) 300 MG capsule Take 300 mg by mouth 3 (three) times daily.    Historical Provider, MD  hydrOXYzine (ATARAX/VISTARIL) 10 MG tablet Take 1 tablet (10 mg total) by mouth every 6 (six) hours as needed for itching. 02/20/14   Nita Sells, MD  insulin aspart (NOVOLOG) 100 UNIT/ML injection Inject 15 Units into the skin 3 (three) times daily before meals.     Historical Provider, MD  insulin glargine (LANTUS)  100 UNIT/ML injection Inject 90 Units into the skin at bedtime.    Historical Provider, MD  lansoprazole (PREVACID) 30 MG capsule Take 30 mg by mouth daily.    Historical Provider, MD  levothyroxine (SYNTHROID, LEVOTHROID) 75 MCG tablet Take 75 mcg by mouth daily before breakfast.    Historical Provider, MD  Liraglutide (VICTOZA) 18 MG/3ML SOPN Inject 1.8 mg into the skin daily.     Historical Provider, MD  potassium chloride (K-DUR) 10 MEQ tablet Take 10 mEq by mouth daily.    Historical Provider, MD  propranolol (INDERAL) 10 MG tablet Take 1 tablet (10 mg total) by mouth daily.  02/19/14   Nita Sells, MD   BP 113/48  Pulse 96  Temp(Src) 98.7 F (37.1 C) (Oral)  Resp 16  Ht 5' 1.5" (1.562 m)  Wt 187 lb 4.8 oz (84.959 kg)  BMI 34.82 kg/m2  SpO2 98% Physical Exam  Nursing note and vitals reviewed. Constitutional: She is oriented to person, place, and time. She appears well-developed and well-nourished.  HENT:  Head: Normocephalic and atraumatic.  Right Ear: External ear normal.  Left Ear: External ear normal.  Nose: Nose normal.  Mouth/Throat: Oropharynx is clear and moist.  Eyes: EOM are normal. Pupils are equal, round, and reactive to light.  Conjunctiva are pale  Neck: Normal range of motion. Neck supple.  Cardiovascular: Normal rate, regular rhythm, normal heart sounds and intact distal pulses.   Pulmonary/Chest: Effort normal and breath sounds normal.  Abdominal: Soft. Bowel sounds are normal. There is tenderness.  Mild diffuse tenderness palpation greatest in the left lower quadrant  Musculoskeletal: Normal range of motion.  Neurological: She is alert and oriented to person, place, and time. She has normal reflexes.  Skin: Skin is warm and dry.  Psychiatric: She has a normal mood and affect. Her behavior is normal. Thought content normal.    ED Course  Procedures (including critical care time) Labs Review Labs Reviewed  CBC WITH DIFFERENTIAL - Abnormal; Notable for the following:    WBC 12.2 (*)    Hemoglobin 11.4 (*)    MCV 77.1 (*)    MCH 23.7 (*)    RDW 24.5 (*)    Neutro Abs 8.7 (*)    All other components within normal limits  COMPREHENSIVE METABOLIC PANEL - Abnormal; Notable for the following:    Sodium 136 (*)    Chloride 94 (*)    Glucose, Bld 146 (*)    BUN 25 (*)    Creatinine, Ser 1.35 (*)    GFR calc non Af Amer 41 (*)    GFR calc Af Amer 48 (*)    All other components within normal limits  URINALYSIS, ROUTINE W REFLEX MICROSCOPIC - Abnormal; Notable for the following:    Leukocytes, UA SMALL (*)    All other  components within normal limits  URINE MICROSCOPIC-ADD ON - Abnormal; Notable for the following:    Squamous Epithelial / LPF FEW (*)    Bacteria, UA MANY (*)    All other components within normal limits  CULTURE, BLOOD (ROUTINE X 2)  CULTURE, BLOOD (ROUTINE X 2)  URINE CULTURE  I-STAT CG4 LACTIC ACID, ED    Imaging Review Dg Chest 2 View  03/05/2014   CLINICAL DATA:  Fever in weakness.  EXAM: CHEST  2 VIEW  COMPARISON:  None.  FINDINGS: The heart size and mediastinal contours are within normal limits. Atherosclerotic disease involves the thoracic aorta. Both lungs are clear. Spondylosis noted within  the thoracic spine.  IMPRESSION: 1. No acute cardiopulmonary abnormalities.   Electronically Signed   By: Kerby Moors M.D.   On: 03/05/2014 22:00   Ct Abdomen Pelvis W Contrast  03/05/2014   CLINICAL DATA:  Lower abdominal pain with diarrhea  EXAM: CT ABDOMEN AND PELVIS WITH CONTRAST  TECHNIQUE: Multidetector CT imaging of the abdomen and pelvis was performed using the standard protocol following bolus administration of intravenous contrast.  CONTRAST:  34m OMNIPAQUE IOHEXOL 300 MG/ML SOLN, 812mOMNIPAQUE IOHEXOL 300 MG/ML SOLN  COMPARISON:  02/16/2014  FINDINGS: The lung bases are clear.  No pleural or pericardial effusion.  The liver has a slightly nodular contour. There is no focal liver abnormality identified. Prior cholecystectomy. Normal appearance of the pancreas. The spleen measures 15 cm in length.  The adrenal glands are both normal. The right kidney appears normal. The left kidney is also normal. The urinary bladder appears normal. Previous hysterectomy.  Calcified atherosclerotic disease involves the abdominal aorta. No aneurysm. There is no upper abdominal adenopathy. No pelvic or inguinal adenopathy noted.  No free fluid or fluid collections within the abdomen or pelvis. The stomach is normal. The small bowel loops have a normal course and caliber. No evidence for bowel obstruction. The  proximal colon appears normal. There is mild wall thickening involving the sigmoid colon without significant inflammatory change noted. No free fluid or fluid collections within the abdomen or pelvis.  Review of the visualized osseous structures is significant for mild lumbar spondylosis.  IMPRESSION: 1. Mild wall thickening involving the sigmoid colon without associated inflammation, free fluid or fluid collections. Findings may reflect mild segmental colitis. 2. Atherosclerotic disease. 3. Morphologic features of the liver suggestive of early cirrhosis. 4. Splenomegaly.   Electronically Signed   By: TaKerby Moors.D.   On: 03/05/2014 22:17     EKG Interpretation None     Filed Vitals:   03/05/14 2242  BP: 135/53  Pulse: 85  Temp:   Resp: 18    MDM   Final diagnoses:  Colitis    [age]ear old female with diabetes and recent admission for urosepsis who comes in today complaining of generalized weakness and diffuse abdominal pain. CT reviewed and reveals mild wall thickening involving the sigmoid colon. She has mild leukocytosis, and blood pressure has normalized here with IV fluids.  Cipro and Flagyl infusing. Plan observation    DaShaune PollackMD 03/05/14 [id number] PollackMD 03/19/14 [id number]

## 2014-03-06 DIAGNOSIS — N390 Urinary tract infection, site not specified: Secondary | ICD-10-CM

## 2014-03-06 LAB — CBC
HCT: 31.1 % — ABNORMAL LOW (ref 36.0–46.0)
Hemoglobin: 9.5 g/dL — ABNORMAL LOW (ref 12.0–15.0)
MCH: 23.8 pg — AB (ref 26.0–34.0)
MCHC: 30.5 g/dL (ref 30.0–36.0)
MCV: 77.8 fL — ABNORMAL LOW (ref 78.0–100.0)
PLATELETS: 135 10*3/uL — AB (ref 150–400)
RBC: 4 MIL/uL (ref 3.87–5.11)
RDW: 24.4 % — AB (ref 11.5–15.5)
WBC: 7.6 10*3/uL (ref 4.0–10.5)

## 2014-03-06 LAB — BASIC METABOLIC PANEL
BUN: 19 mg/dL (ref 6–23)
CALCIUM: 8.2 mg/dL — AB (ref 8.4–10.5)
CO2: 27 mEq/L (ref 19–32)
Chloride: 103 mEq/L (ref 96–112)
Creatinine, Ser: 1.04 mg/dL (ref 0.50–1.10)
GFR, EST AFRICAN AMERICAN: 65 mL/min — AB (ref >90)
GFR, EST NON AFRICAN AMERICAN: 56 mL/min — AB (ref >90)
Glucose, Bld: 129 mg/dL — ABNORMAL HIGH (ref 70–99)
Potassium: 3.9 mEq/L (ref 3.7–5.3)
SODIUM: 139 meq/L (ref 137–147)

## 2014-03-06 LAB — GLUCOSE, CAPILLARY
GLUCOSE-CAPILLARY: 129 mg/dL — AB (ref 70–99)
GLUCOSE-CAPILLARY: 138 mg/dL — AB (ref 70–99)
GLUCOSE-CAPILLARY: 150 mg/dL — AB (ref 70–99)
Glucose-Capillary: 109 mg/dL — ABNORMAL HIGH (ref 70–99)
Glucose-Capillary: 150 mg/dL — ABNORMAL HIGH (ref 70–99)
Glucose-Capillary: 175 mg/dL — ABNORMAL HIGH (ref 70–99)
Glucose-Capillary: 179 mg/dL — ABNORMAL HIGH (ref 70–99)

## 2014-03-06 MED ORDER — CIPROFLOXACIN IN D5W 400 MG/200ML IV SOLN
400.0000 mg | Freq: Two times a day (BID) | INTRAVENOUS | Status: DC
  Administered 2014-03-06 – 2014-03-07 (×3): 400 mg via INTRAVENOUS
  Filled 2014-03-06 (×4): qty 200

## 2014-03-06 MED ORDER — DIPHENHYDRAMINE HCL 25 MG PO CAPS
50.0000 mg | ORAL_CAPSULE | Freq: Every evening | ORAL | Status: DC | PRN
  Administered 2014-03-06: 50 mg via ORAL
  Filled 2014-03-06 (×2): qty 2

## 2014-03-06 MED ORDER — HYDROMORPHONE HCL PF 1 MG/ML IJ SOLN
1.0000 mg | INTRAMUSCULAR | Status: DC | PRN
  Filled 2014-03-06: qty 1

## 2014-03-06 MED ORDER — ONDANSETRON HCL 4 MG PO TABS: 4.0000 mg | ORAL_TABLET | Freq: Four times a day (QID) | ORAL | Status: DC | PRN

## 2014-03-06 MED ORDER — ENOXAPARIN SODIUM 40 MG/0.4ML ~~LOC~~ SOLN
40.0000 mg | SUBCUTANEOUS | Status: DC
  Administered 2014-03-06: 40 mg via SUBCUTANEOUS
  Filled 2014-03-06 (×3): qty 0.4

## 2014-03-06 MED ORDER — LEVOTHYROXINE SODIUM 75 MCG PO TABS
75.0000 ug | ORAL_TABLET | Freq: Every day | ORAL | Status: DC
Start: 2014-03-06 — End: 2014-03-07
  Administered 2014-03-06 – 2014-03-07 (×2): 75 ug via ORAL
  Filled 2014-03-06 (×2): qty 1

## 2014-03-06 MED ORDER — ACETAMINOPHEN 325 MG PO TABS: 650.0000 mg | ORAL_TABLET | Freq: Four times a day (QID) | ORAL | Status: DC | PRN

## 2014-03-06 MED ORDER — PROPRANOLOL HCL 20 MG PO TABS
10.0000 mg | ORAL_TABLET | Freq: Every day | ORAL | Status: DC
  Administered 2014-03-06 – 2014-03-07 (×2): 10 mg via ORAL
  Filled 2014-03-06 (×3): qty 1

## 2014-03-06 MED ORDER — METRONIDAZOLE IN NACL 5-0.79 MG/ML-% IV SOLN
500.0000 mg | Freq: Three times a day (TID) | INTRAVENOUS | Status: DC
  Administered 2014-03-06 – 2014-03-07 (×4): 500 mg via INTRAVENOUS
  Filled 2014-03-06 (×5): qty 100

## 2014-03-06 MED ORDER — ONDANSETRON HCL 4 MG/2ML IJ SOLN: 4.0000 mg | Freq: Four times a day (QID) | INTRAMUSCULAR | Status: DC | PRN

## 2014-03-06 MED ORDER — GABAPENTIN 300 MG PO CAPS
300.0000 mg | ORAL_CAPSULE | Freq: Three times a day (TID) | ORAL | Status: DC
  Administered 2014-03-06 – 2014-03-07 (×5): 300 mg via ORAL
  Filled 2014-03-06 (×6): qty 1

## 2014-03-06 MED ORDER — SODIUM CHLORIDE 0.9 % IV SOLN
INTRAVENOUS | Status: AC
  Administered 2014-03-06: 11:00:00 via INTRAVENOUS

## 2014-03-06 MED ORDER — FERROUS SULFATE 325 (65 FE) MG PO TABS
325.0000 mg | ORAL_TABLET | Freq: Two times a day (BID) | ORAL | Status: DC
  Administered 2014-03-06 – 2014-03-07 (×4): 325 mg via ORAL
  Filled 2014-03-06 (×5): qty 1

## 2014-03-06 MED ORDER — IBUPROFEN 800 MG PO TABS
400.0000 mg | ORAL_TABLET | Freq: Four times a day (QID) | ORAL | Status: DC | PRN
Start: 2014-03-06 — End: 2014-03-07
  Administered 2014-03-06: 400 mg via ORAL
  Filled 2014-03-06: qty 1

## 2014-03-06 MED ORDER — INSULIN ASPART 100 UNIT/ML ~~LOC~~ SOLN
0.0000 [IU] | SUBCUTANEOUS | Status: DC
  Administered 2014-03-06: 1 [IU] via SUBCUTANEOUS
  Administered 2014-03-06 (×2): 2 [IU] via SUBCUTANEOUS
  Administered 2014-03-06 – 2014-03-07 (×4): 1 [IU] via SUBCUTANEOUS
  Administered 2014-03-07: 2 [IU] via SUBCUTANEOUS

## 2014-03-06 NOTE — Care Management Note (Addendum)
    Page 1 of 1   03/07/2014     2:06:37 PM CARE MANAGEMENT NOTE 03/07/2014  Patient:  Penny Martinez, Penny Martinez   Account Number:  [id number]  Date Initiated:  03/06/2014  Documentation initiated by:  Theophilus Kinds  Subjective/Objective Assessment:   Pt admitted from home with colitis. Pt lives with her husband and will return home at discharge. Pt is independent with ADL's. Pts PCP is Dr. Alford Highland of [facility] and pt has followup appt July 2.     Action/Plan:   No CM needs noted.   Anticipated DC Date:  03/08/2014   Anticipated DC Plan:  [place]  CM consult      Choice offered to / List presented to:             Status of service:  Completed, signed off Medicare Important Message given?   (If response is "NO", the following Medicare IM given date fields will be blank) Date Medicare IM given:   Medicare IM given by:   Date Additional Medicare IM given:   Additional Medicare IM given by:    Discharge Disposition:  HOME/SELF CARE  Per UR Regulation:    If discussed at Long Length of Stay Meetings, dates discussed:    Comments:  03/07/14 [place], RN BSN CM Pt discharged home today. No CM needs noted.  03/06/14 [place], RN BSN CM

## 2014-03-06 NOTE — Progress Notes (Signed)
PROGRESS NOTE  Penny Martinez QAS:[id number] DOB: 03-04-1951 DOA: 03/05/2014 PCP: Mackey Birchwood  Summary: 63 year old recently admitted for UTI/pyelonephritis with sepsis, discharged on ciprofloxacin who presented to the emergency department with a one-day history of nausea, generalized abdominal pain and profuse diarrhea.  Assessment/Plan: 1. Acute segmental colitis, clinically improving. 2. Possible UTI. Followup culture. Continue empiric antibiotics. 3. Recent UTI /pyelonephritis with sepsis 4. COPD. Appears stable. 5. DM. Blood sugars are stable. 6. Anemia of chronic disease. Hemoglobin stable. Thrombocytopenia secondary to cirrhosis. Stable. 7. 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 Continue propranolol.   Overall feeling better. Continue empiric antibiotics, followup C. difficile PCR and urine culture. Anticipate discharge home next 24 hours if continues to improve.  Change to inpatient   Code Status: full code DVT prophylaxis: Lovenox Family Communication: none present Disposition Plan:   Murray Hodgkins, MD  [facility]  Pager [phone] If 7PM-7AM, please contact night-coverage at [email or web address], password [facility] 03/06/2014, 9:14 AM  LOS: 1 day   Consultants:    Procedures:    Antibiotics:  Ciprofloxacin 6/29 >>  Metronidazole 6/29  >>   HPI/Subjective: Still having some diarrhea but overall feeling better. Some lower crampy abdominal pain.  Objective: Filed Vitals:   03/05/14 2200 03/05/14 2242 03/05/14 2300 03/05/14 2326  BP: 117/43 135/53 126/68 140/74  Pulse: 85 85 82 84  Temp:    98.6 F (37 C)  TempSrc:    Oral  Resp:  18  20  Height:    5' 1"  (1.549 m)  Weight:    87.2 kg (192 lb 3.9 oz)  SpO2: 96% 96% 98% 99%    Intake/Output Summary (Last 24 hours) at 03/06/14 0914 Last data filed at 03/06/14 0600  Gross per 24 hour  Intake    575 ml  Output    250 ml   Net    325 ml     Filed Weights   03/05/14 2007 03/05/14 2326  Weight: 84.959 kg (187 lb 4.8 oz) 87.2 kg (192 lb 3.9 oz)    Exam:   Afebrile, vital signs stable. No hypoxia. Gen. Appears calm, comfortable sitting on side of bed. Well-appearing. Psych. Alert. Speech fluent and clear. Cardiovascular. Regular rate and rhythm. No murmur, rub or gallop.  Respiratory. Clear to auscultation bilaterally. No wheezes, rales or rhonchi. Normal respiratory effort. Abdomen. Soft, nontender. Nondistended.  Data Reviewed: I/O: BM x1 Chemistry: BUN and creatinine have normalized. Potassium normal. Heme: Hemoglobin stable at 9.5. Platelet count stable and 35. leukocytosis has resolved.  ID: Urinalysis was positive, urine culture pending. Stool culture and blood cultures pending.  Imaging: CT of the abdomen and pelvis suggested mild segmental colitis.    Scheduled Meds: . ciprofloxacin  400 mg Intravenous Q12H  . enoxaparin (LOVENOX) injection  40 mg Subcutaneous Q24H  . ferrous sulfate  325 mg Oral BID  . gabapentin  300 mg Oral TID  . insulin aspart  0-9 Units Subcutaneous 6 times per day  . levothyroxine  75 mcg Oral QAC breakfast  . metronidazole  500 mg Intravenous Q8H  . propranolol  10 mg Oral Daily   Continuous Infusions: . sodium chloride 100 mL/hr at 03/06/14 0015    Principal Problem:   Acute colitis Active Problems:   HTN (hypertension)   DM (diabetes mellitus)   CHF (congestive heart failure)   CKD (chronic kidney disease)   Abdominal pain, acute, left lower quadrant   Colitis   Time  spent 20 minutes

## 2014-03-07 DIAGNOSIS — E119 Type 2 diabetes mellitus without complications: Secondary | ICD-10-CM

## 2014-03-07 DIAGNOSIS — A0472 Enterocolitis due to Clostridium difficile, not specified as recurrent: Principal | ICD-10-CM

## 2014-03-07 LAB — GLUCOSE, CAPILLARY
GLUCOSE-CAPILLARY: 149 mg/dL — AB (ref 70–99)
GLUCOSE-CAPILLARY: 200 mg/dL — AB (ref 70–99)
Glucose-Capillary: 167 mg/dL — ABNORMAL HIGH (ref 70–99)

## 2014-03-07 LAB — CLOSTRIDIUM DIFFICILE BY PCR: CDIFFPCR: POSITIVE — AB

## 2014-03-07 MED ORDER — METRONIDAZOLE 500 MG PO TABS: 500.0000 mg | ORAL_TABLET | Freq: Three times a day (TID) | ORAL | Status: DC

## 2014-03-07 MED ORDER — FLUCONAZOLE 150 MG PO TABS: 150.0000 mg | ORAL_TABLET | Freq: Every day | ORAL | Status: DC

## 2014-03-07 MED ORDER — ONDANSETRON HCL 4 MG PO TABS: 4.0000 mg | ORAL_TABLET | Freq: Four times a day (QID) | ORAL | Status: DC | PRN

## 2014-03-07 MED ORDER — CEFUROXIME AXETIL 500 MG PO TABS: 500.0000 mg | ORAL_TABLET | Freq: Two times a day (BID) | ORAL | Status: DC

## 2014-03-07 NOTE — Discharge Instructions (Signed)
Clostridium Difficile Infection Clostridium difficile (C. difficile) is a bacteria found in the intestinal tract or colon. Under certain conditions, it causes diarrhea and sometimes severe disease. The severe form of the disease is known as pseudomembranous colitis (often called C. difficile colitis). This disease can damage the lining of the colon or cause the colon to become enlarged (toxic megacolon).  CAUSES  Your colon normally contains many different bacteria, including C. difficile. The balance of bacteria in your colon can change during illness. This is especially true when you take antibiotic medicine. Taking antibiotics may allow the C. difficile to grow, multiply excessively, and make a toxin that then causes illness. The elderly and people with certain medical conditions have a greater risk of getting C. difficile infections. SYMPTOMS   Watery diarrhea.  Fever.  Fatigue.  Loss of appetite.  Nausea.  Abdominal swelling, pain, or tenderness.  Dehydration. DIAGNOSIS  Your symptoms may make your caregiver suspicious of a C. difficile infection, especially if you have used antibiotics in the preceding weeks. However, there are only 2 ways to know for certain whether you have a C. difficile infection:  A lab test that finds the toxin in your stool.  The specific appearance of an abnormality (pseudomembrane) in your colon. This can only be seen by doing a sigmoidoscopy or colonoscopy. These procedures involve passing an instrument through your rectum to look at the inside of your colon. Your caregiver will help determine if these tests are necessary. TREATMENT   Most people are successfully treated with one of two specific antibiotics, usually given by mouth. Other antibiotics you are receiving are stopped if possible.  Intravenous (IV) fluids and correction of electrolyte imbalance may be necessary.  Rarely, surgery may be needed to remove the infected part of the  intestines.  Careful hand washing by you and your caregivers is important to prevent the spread of infection. In the hospital, your caregivers may also put on gowns and gloves to prevent the spread of the C. difficile bacteria. Your room is also cleaned regularly with a solution containing bleach or a product that is known to kill C. difficile. HOME CARE INSTRUCTIONS  Drink enough fluids to keep your urine clear or pale yellow. Avoid milk, caffeine, and alcohol.  Ask your caregiver for specific rehydration instructions.  Try eating small, frequent meals rather than large meals.  Take your antibiotics as directed. Finish them even if you start to feel better.  Do not use medicines to slow diarrhea. This could delay healing or cause complications.  Wash your hands thoroughly after using the bathroom and before preparing food.  Make sure people who live with you wash their hands often, too.  Carefully disinfect all surfaces with a product that contains chlorine bleach. SEEK MEDICAL CARE IF:  Diarrhea persists longer than expected or recurs after completing your course of antibiotic treatment for the C. difficile infection.  You have trouble staying hydrated. SEEK IMMEDIATE MEDICAL CARE IF:  You develop a new fever.  You have increasing abdominal pain or tenderness.  There is blood in your stools, or your stools are dark black and tarry.  You cannot hold down food or liquids. MAKE SURE YOU:   Understand these instructions.  Will watch your condition.  Will get help right away if you are not doing well or get worse. Document Released: 06/03/2005 Document Revised: 12/19/2012 Document Reviewed: 01/30/2011 [facility] Patient Information 2015 [place], [place]. This information is not intended to replace advice given to you by  your health care provider. Make sure you discuss any questions you have with your health care provider.

## 2014-03-07 NOTE — Discharge Summary (Signed)
Physician Discharge Summary  Penny Martinez OZH:[id number] DOB: 1950-12-23 DOA: 03/05/2014  PCP: Quinn Axe, PA-C  Admit date: 03/05/2014 Discharge date: 03/07/2014  Time spent: 45 minutes  Recommendations for Outpatient Follow-up:  Patient will be discharged home. She is to followup with her primary care physician within the next 2 weeks. Patient was instructed to continue taking her medications as prescribed. She was instructed to return to the emergency department if her symptoms have not resolved and worsened. Patient is to follow a carb modified low sodium diet. Patient was also instructed regarding good and proper hygiene.  Discharge Diagnoses:  Principal Problem:   Acute colitis Active Problems:   HTN (hypertension)   DM (diabetes mellitus)   CHF (congestive heart failure)   CKD (chronic kidney disease)   Abdominal pain, acute, left lower quadrant   Colitis  Discharge Condition: Stable  Diet recommendation: Heart healthy, can't modified  Filed Weights   03/05/14 2007 03/05/14 2326  Weight: 84.959 kg (187 lb 4.8 oz) 87.2 kg (192 lb 3.9 oz)    History of present illness:  63 year old female with recent hospitalization 2 weeks ago for sepsis from UTI presented to the emergency room with nausea as well as abdominal pain and many episodes of diarrhea. Patient also complained of feeling very weak in took her blood pressure, her blood pressure was in the 80s systolically. Patient denies any fevers, vomiting. She complained of feeling nauseous as well as having watery nonbloody diarrhea. Patient was on antibiotics for her urinary tract infection, Cipro was one of the antibiotics but cannot number the name of the other antibiotic.  Hospital Course:  Clostridium difficile colitis/acute segmental colitis -CT of the abdomen and pelvis showed mild wall thickening involving the sigmoid colon, possible segmental colitis -C. difficile PCR was positive -Leukocytosis has resolved,  patient is afebrile -Will continue Flagyl for 14 day course  Possible UTI with recent pyelonephritis and sepsis -UA: WBC 11-20, small leukocytes, many bacteria -Urine culture pending -Patient initially placed on ciprofloxacin, however will be discharged with Ceftin.  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  Neck: Supple, no JVD, no masses  Cardiovascular: S1 S2 auscultated, no rubs, murmurs or gallops. Regular rate and rhythm.  Respiratory: Clear to auscultation bilaterally with equal chest rise  Abdomen: Soft, nontender, nondistended, + bowel sounds  Extremities: warm dry without cyanosis clubbing or edema  Neuro: AAOx3, cranial nerves grossly intact. Strength 5/5 in patient's upper and lower extremities bilaterally  Skin: Without rashes exudates or nodules  Psych: Normal affect and demeanor with intact judgement and insight  Discharge Instructions      Discharge Instructions   Discharge instructions    Complete by:  As directed   Patient will be discharged home. She is to followup with her primary care physician within the next 2 weeks. Patient was instructed to continue taking her medications as prescribed.  She was instructed to return to the emergency  department if her symptoms have not resolved and worsened. Patient is to follow a carb modified low sodium diet. Patient was also instructed regarding good and proper hygiene.     Increase activity slowly    Complete by:  As directed             Medication List    STOP taking these medications       ciprofloxacin 500 MG tablet  Commonly known as:  CIPRO      TAKE these medications       benazepril 20 MG tablet  Commonly known as:  LOTENSIN  Take 20 mg by mouth daily.     cefUROXime 500 MG tablet  Commonly known as:  CEFTIN  Take 1 tablet (500 mg total) by mouth 2 (two) times daily with a meal.     ferrous sulfate 325 (65 FE) MG tablet  Take 325 mg by mouth 2 (two) times daily.     fluconazole 150 MG tablet  Commonly known as:  DIFLUCAN  Take 1 tablet (150 mg total) by mouth daily.     furosemide 80 MG tablet  Commonly known as:  LASIX  Take 80 mg by mouth daily.     gabapentin 300 MG capsule  Commonly known as:  NEURONTIN  Take 300 mg by mouth 3 (three) times daily.     insulin aspart 100 UNIT/ML injection  Commonly known as:  novoLOG  Inject 30-40 Units into the skin 3 (three) times daily before meals. Based on sliding scale measurements     lansoprazole 30 MG capsule  Commonly known as:  PREVACID  Take 30 mg by mouth daily.     LANTUS 100 UNIT/ML injection  Generic drug:  insulin glargine  Inject 90 Units into the skin at bedtime.     levothyroxine 75 MCG tablet  Commonly known as:  SYNTHROID, LEVOTHROID  Take 75 mcg by mouth daily before breakfast.     metroNIDAZOLE 500 MG tablet  Commonly known as:  FLAGYL  Take 1 tablet (500 mg total) by mouth every 8 (eight) hours.     ondansetron 4 MG tablet  Commonly known as:  ZOFRAN  Take 1 tablet (4 mg total) by mouth every 6 (six) hours as needed for nausea.     potassium chloride 10 MEQ tablet  Commonly known as:  K-DUR  Take 10 mEq by mouth daily.     propranolol 10 MG tablet  Commonly known as:   INDERAL  Take 1 tablet (10 mg total) by mouth daily.     VICTOZA 18 MG/3ML Sopn  Generic drug:  Liraglutide  Inject 1.8 mg into the skin daily.       Allergies  Allergen Reactions  . Niacin Rash and Shortness Of Breath  . Penicillins Shortness Of Breath  . Erythromycin Hives  . Statins     Other reaction(s): Muscle Pain  . Sulfa Antibiotics    Follow-up Information   Follow up with ROBERTSON, ANTHONY T, PA-C In 2 weeks. [facility] followup)    Specialty:  Physician Assistant   Contact information:   [address] [place] [address] [phone]        The results of significant diagnostics from this hospitalization (including imaging, microbiology, ancillary and laboratory) are listed below for reference.    Significant Diagnostic Studies: Dg Chest 2 View  03/05/2014   CLINICAL DATA:  Fever in weakness.  EXAM: CHEST  2 VIEW  COMPARISON:  None.  FINDINGS: The heart size and mediastinal contours are within normal limits. Atherosclerotic disease involves the thoracic aorta. Both lungs are clear. Spondylosis noted within the thoracic spine.  IMPRESSION: 1. No acute cardiopulmonary abnormalities.   Electronically Signed   By: Signa Kell M.D.   On: 03/05/2014 22:00   Ct Abdomen Pelvis W Contrast  03/05/2014   CLINICAL DATA:  Lower abdominal pain with diarrhea  EXAM: CT ABDOMEN AND PELVIS WITH CONTRAST  TECHNIQUE: Multidetector CT imaging of the abdomen and pelvis was performed using the standard protocol following bolus administration of intravenous contrast.  CONTRAST:  50mL OMNIPAQUE IOHEXOL 300 MG/ML SOLN, 80mL OMNIPAQUE IOHEXOL 300 MG/ML SOLN  COMPARISON:  02/16/2014  FINDINGS: The lung bases are clear.  No pleural or pericardial effusion.  The liver has a slightly nodular contour. There is no focal liver abnormality identified. Prior cholecystectomy. Normal appearance of the pancreas. The spleen measures 15 cm in length.  The adrenal glands are both normal. The right kidney  appears normal. The left kidney is also normal. The urinary bladder appears normal. Previous hysterectomy.  Calcified atherosclerotic disease involves the abdominal aorta. No aneurysm. There is no upper abdominal adenopathy. No pelvic or inguinal adenopathy noted.  No free fluid or fluid collections within the abdomen or pelvis. The stomach is normal. The small bowel loops have a normal course and caliber. No evidence for bowel obstruction. The proximal colon appears normal. There is mild wall thickening involving the sigmoid colon without significant inflammatory change noted. No free fluid or fluid collections within the abdomen or pelvis.  Review of the visualized osseous structures is significant for mild lumbar spondylosis.  IMPRESSION: 1. Mild wall thickening involving the sigmoid colon without associated inflammation, free fluid or fluid collections. Findings may reflect mild segmental colitis. 2. Atherosclerotic disease. 3. Morphologic features of the liver suggestive of early cirrhosis. 4. Splenomegaly.   Electronically Signed   By: Signa Kell M.D.   On: 03/05/2014 22:17   Ct Abdomen Pelvis W Contrast  02/16/2014   CLINICAL DATA:  Fever, right upper quadrant pain, diarrhea, nausea  EXAM: CT ABDOMEN AND PELVIS WITH CONTRAST  TECHNIQUE: Multidetector CT imaging of the abdomen and pelvis was performed using the standard protocol following bolus administration of intravenous contrast.  CONTRAST:  50mL OMNIPAQUE IOHEXOL 300 MG/ML SOLN, 80mL OMNIPAQUE IOHEXOL 300 MG/ML SOLN  COMPARISON:  None.  FINDINGS: Lung bases are unremarkable.  Mild posterior basilar atelectasis.  Sagittal images of the spine shows multilevel degenerative changes thoracolumbar spine. Extensive atherosclerotic calcifications of abdominal aorta and iliac arteries. No aortic aneurysm. There is subtle nodular contour of the liver. Clinical correlation is necessary to exclude cirrhosis.  Splenomegaly is noted.  The spleen measures 16 cm  in length.  Portal vein measures 1.4 cm in diameter.  Atherosclerotic calcifications of coronary arteries. No aortic aneurysm.  There is a low-density nodular thickening of left adrenal gland measures 1.8 cm. This is suspicious for adrenal adenoma.  The kidneys show symmetrical enhancement. Nonspecific mild right perinephric and proximal right periureteral stranding.  Delayed renal images shows persistent parenchymal enhancement with delay excretion of contrast material. Findings are suspicious for renal failure/insufficiency or medical renal disease. Clinical correlation is necessary. No retroperitoneal adenopathy. No mesenteric fluid collection. Small hiatal hernia.  Distended urinary bladder is noted. There is mild skin thickening and stranding subcutaneous fat in anterior pelvic wall. This may be due to subcutaneous injections or mild cellulitis. Clinical correlation is necessary.  There is no abdominal or pelvic ascites. No small bowel obstruction.  No pericecal inflammation. The patient is status post appendectomy. Status postcholecystectomy. Status post hysterectomy. No inguinal adenopathy. No destructive bony lesions are noted within pelvis.  IMPRESSION: 1. There is subtle micronodular contour of the liver. Clinical correlation is necessary to exclude early cirrhosis. Splenomegaly is noted. The spleen measures 16 cm in length. Portal vein measures 1.4 cm in diameter suspicious for portal hypertension. 2. There is bilateral renal delay excretion with persistent parenchymal enhancement on delayed images suspicious for renal insufficiency or medical renal disease. Clinical correlation is necessary. Nonspecific mild right perinephric and right proximal periureteral stranding. No calcified ureteral calculi are noted. 3. No small bowel or colonic obstruction. 4. Degenerative changes thoracolumbar spine. 5. Status post hysterectomy, status post appendectomy. Status post cholecystectomy. 6. Extensive atherosclerotic  calcifications abdominal aorta and iliac arteries. 7. Moderate distended urinary bladder. No calcified calculi are noted within urinary bladder. 8. There is mild skin thickening and subcutaneous stranding anterior pelvic wall. This may be due to subcutaneous injections or mild cellulitis. Clinical correlation is necessary. These results were called by telephone at the time of interpretation on 02/16/2014 at 10:24 AM to Dr. Lilly Cove , who verbally acknowledged these results.   Electronically Signed   By: Natasha Mead M.D.   On: 02/16/2014 10:25   Dg Chest Port 1 View  02/15/2014   CLINICAL DATA:  Fever. Weakness. Vomiting. Diarrhea. Chest discomfort.  EXAM: PORTABLE CHEST - 1 VIEW  COMPARISON:  None.  FINDINGS: The cardiopericardial silhouette is enlarged. Pulmonary vascular congestion is present with cephalization of pulmonary blood flow. Basilar atelectasis. Evaluation of the bases is suboptimal due to body habitus and underpenetration from portable technique. No pleural effusions. No gross consolidation.  IMPRESSION: Cardiomegaly and pulmonary vascular congestion. No focal consolidation.   Electronically Signed   By: Andreas Newport M.D.   On: 02/15/2014 17:45    Microbiology: Recent Results (from the past 240 hour(s))  CULTURE, BLOOD (ROUTINE X 2)     Status: None   Collection Time    03/05/14  8:53 PM      Result Value Ref Range Status   Specimen Description BLOOD RIGHT ANTECUBITAL   Final   Special Requests     Final   Value: BOTTLES DRAWN AEROBIC AND ANAEROBIC AEB=6CC ANA=3CC   Culture NO GROWTH 2 DAYS   Final   Report Status PENDING   Incomplete  CULTURE, BLOOD (ROUTINE X 2)     Status: None   Collection Time    03/05/14  8:53 PM      Result Value Ref Range Status   Specimen Description BLOOD RIGHT FOREARM   Final   Special Requests BOTTLES DRAWN AEROBIC AND ANAEROBIC 8CC EACH   Final   Culture NO GROWTH 2 DAYS   Final   Report Status PENDING   Incomplete  URINE CULTURE     Status:  None   Collection Time    03/05/14  9:40 PM      Result Value Ref Range Status   Specimen Description URINE, CATHETERIZED   Final   Special Requests NONE   Final   Culture  Setup Time     Final   Value: 03/06/2014 14:42     Performed at [facility] Count     Final   Value: >=100,000 COLONIES/ML     Performed at [facility]   Culture     Final  Value: GRAM NEGATIVE RODS     Performed at [facility]   Report Status PENDING   Incomplete  STOOL CULTURE     Status: None   Collection Time    03/06/14  2:21 AM      Result Value Ref Range Status   Specimen Description STOOL   Final   Special Requests NONE   Final   Culture     Final   Value: Culture reincubated for better growth     Performed at [facility]   Report Status PENDING   Incomplete  CLOSTRIDIUM DIFFICILE BY PCR     Status: Abnormal   Collection Time    03/06/14  6:00 PM      Result Value Ref Range Status   C difficile by pcr POSITIVE (*) NEGATIVE Final   Comment: CRITICAL RESULT CALLED TO, READ BACK BY AND VERIFIED WITH:     THOMAS,C. AT 1924 ON 03/06/2014     Labs: Basic Metabolic Panel:  Recent Labs Lab 03/05/14 2053 03/06/14 0523  NA 136* 139  K 4.3 3.9  CL 94* 103  CO2 28 27  GLUCOSE 146* 129*  BUN 25* 19  CREATININE 1.35* 1.04  CALCIUM 9.6 8.2*   Liver Function Tests:  Recent Labs Lab 03/05/14 2053  AST 28  ALT 28  ALKPHOS 95  BILITOT 0.6  PROT 7.9  ALBUMIN 3.8   No results found for this basename: LIPASE, AMYLASE,  in the last 168 hours No results found for this basename: AMMONIA,  in the last 168 hours CBC:  Recent Labs Lab 03/05/14 2053 03/06/14 0523  WBC 12.2* 7.6  NEUTROABS 8.7*  --   HGB 11.4* 9.5*  HCT 37.1 31.1*  MCV 77.1* 77.8*  PLT 202 135*   Cardiac Enzymes: No results found for this basename: CKTOTAL, CKMB, CKMBINDEX, TROPONINI,  in the last 168 hours BNP: BNP (last 3 results)  Recent Labs  02/15/14 1726  PROBNP  277.5*   CBG:  Recent Labs Lab 03/06/14 1642 03/06/14 2010 03/06/14 2356 03/07/14 0420 03/07/14 0757  GLUCAP 150* 179* 150* 149* 167*       Signed:  Tanyiah Laurich  [facility] 03/07/2014, 12:00 PM

## 2014-03-08 LAB — URINE CULTURE: Colony Count: >=100000

## 2014-03-10 LAB — STOOL CULTURE

## 2014-03-13 LAB — CULTURE, BLOOD (ROUTINE X 2)
CULTURE: NO GROWTH
Culture: NO GROWTH

## 2014-04-02 ENCOUNTER — Other Ambulatory Visit: Payer: Self-pay | Admitting: Gastroenterology

## 2014-04-02 ENCOUNTER — Encounter (INDEPENDENT_AMBULATORY_CARE_PROVIDER_SITE_OTHER): Payer: Self-pay

## 2014-04-02 ENCOUNTER — Encounter: Payer: Self-pay | Admitting: Gastroenterology

## 2014-04-02 ENCOUNTER — Ambulatory Visit (INDEPENDENT_AMBULATORY_CARE_PROVIDER_SITE_OTHER): Payer: BC Managed Care – PPO | Admitting: Gastroenterology

## 2014-04-02 VITALS — BP 115/65 | HR 72 | Temp 97.1°F | Ht 61.5 in | Wt 189.6 lb

## 2014-04-02 DIAGNOSIS — D509 Iron deficiency anemia, unspecified: Secondary | ICD-10-CM

## 2014-04-02 DIAGNOSIS — K7460 Unspecified cirrhosis of liver: Secondary | ICD-10-CM | POA: Insufficient documentation

## 2014-04-02 MED ORDER — PROPRANOLOL HCL 10 MG PO TABS: 20.0000 mg | ORAL_TABLET | Freq: Every day | ORAL | Status: DC

## 2014-04-02 NOTE — Assessment & Plan Note (Signed)
With IDA component. EGD/TCS on file without significant findings. Continue iron but hold X 7 days prior to capsule study. Proceed with capsule study in near future. Further recommendations to follow. May ultimately need hematology evaluation.

## 2014-04-02 NOTE — Assessment & Plan Note (Addendum)
Well-compensated. Enroll in hepatoma screening. Next [place] in Dec 2015. Needs labs at that time as well. LFTs normal. Provided rx for Hep A/B vaccinations. Increase Inderal to 20 mg daily for target HR of 55. Variceal screening up-to-date. No need for further surveillance as long as she remains on Inderal. Return in 6 months.

## 2014-04-02 NOTE — Progress Notes (Signed)
Primary Care Physician:  Mackey Birchwood Primary GI: Dr. Gala Romney   Chief Complaint  Patient presents with  . Follow-up    HPI:   Penny Martinez presents today in follow-up after hospitalization for microcytic anemia requiring 2 units PRBCs. Colonoscopy and EGD performed with no overt evidence to contribute to IDA. Presents today to discuss capsule study. 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 Viral markers negative for hepatitis. Recently discharged from hospitalization for Cdiff earlier this month.  Treated with Flagyl X 14 days.   Diarrhea resolved 2 weeks ago. Feels like she is dealing more with IBS now. Thinks the apple she ate set it off. Diagnosed with IBS in the 1980s. No hematochezia or melena.    Past Medical History  Diagnosis Date  . Diabetes mellitus without complication   . COPD (chronic obstructive pulmonary disease)   . Hypertension   . Hypercholesteremia   . Acid reflux   . IBS (irritable bowel syndrome)   . CHF (congestive heart failure)   . Cirrhosis     likely due to NASH. Negative viral markers 2015  . C. difficile colitis June/July 2015    Past Surgical History  Procedure Laterality Date  . 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 Abdominal hysterectomy    . Cholecystectomy    . Appendectomy    . Esophagogastroduodenoscopy N/A 02/19/2014    Dr. Gala Romney: esophageal varices, multiple gastric polyps with largest biopsied and hyperplastic. Negative H.pylori  . Colonoscopy N/A 02/19/2014    Dr. Gala Romney: rectal varices, rectal polyp overlying a varix non-manipulated  . Urosepsis      Current Outpatient Prescriptions  Medication Sig Dispense Refill  . aspirin 81 MG tablet Take 81 mg by mouth daily.      . benazepril (LOTENSIN) 20 MG tablet Take 20 mg by mouth daily.      . diphenhydrAMINE (SOMINEX) 25 MG tablet Take 25 mg by mouth at bedtime as needed for sleep. Takes two tablets at bedtime      .  diphenoxylate-atropine (LOMOTIL) 2.5-0.025 MG per tablet Take by mouth 4 (four) times daily as needed for diarrhea or loose stools. One daily      . ferrous sulfate 325 (65 FE) MG tablet Take 325 mg by mouth 2 (two) times daily.      . furosemide (LASIX) 80 MG tablet Take 80 mg by mouth daily.      Marland Kitchen gabapentin (NEURONTIN) 300 MG capsule Take 300 mg by mouth 3 (three) times daily.      . insulin aspart (NOVOLOG) 100 UNIT/ML injection Inject 30-36 Units into the skin 3 (three) times daily before meals. Based on sliding scale measurements      . insulin glargine (LANTUS) 100 UNIT/ML injection Inject 90 Units into the skin at bedtime.      . lansoprazole (PREVACID) 30 MG capsule Take 30 mg by mouth daily.      Marland Kitchen levothyroxine (SYNTHROID, LEVOTHROID) 75 MCG tablet Take 75 mcg by mouth daily before breakfast.      . Liraglutide (VICTOZA) 18 MG/3ML SOPN Inject 1.8 mg into the skin daily.       . Multiple Vitamins-Minerals (CENTRUM SILVER PO) Take by mouth.      . potassium chloride (K-DUR) 10 MEQ tablet Take 10 mEq by mouth daily.      . propranolol (INDERAL) 10 MG tablet Take 2 tablets (20 mg total) by mouth daily.  60 tablet  11   No current facility-administered medications for this visit.    Allergies as of 04/02/2014 - Review Complete 04/02/2014  Allergen Reaction Noted  . Niacin Rash and Shortness Of Breath 03/05/2014  . Penicillins Shortness Of Breath 02/15/2014  . Erythromycin Hives 02/15/2014  . Statins  03/05/2014  . Sulfa antibiotics  02/15/2014    Family History  Problem Relation Age of Onset  . Colon cancer Neg Hx     History   Social History  . Marital Status: Married    Spouse Name: N/A    Number of Children: N/A  . Years of Education: N/A   Social History Main Topics  . Smoking status: Former Research scientist (life sciences)  . Smokeless tobacco: Former Systems developer     Comment: Quit smoking 11 years ago  . Alcohol Use: No  . Drug Use: No  . Sexual Activity: Not Currently    Birth Control/  Protection: Surgical   Other Topics Concern  . Not on file   Social History Narrative  . No narrative on file    Review of Systems: As mentioned in HPI  Physical Exam: BP 115/65  Pulse 72  Temp(Src) 97.1 F (36.2 C) (Oral)  Ht 5' 1.5" (1.562 m)  Wt 189 lb 9.6 oz (86.002 kg)  BMI 35.25 kg/m2 General:   Alert and oriented. No distress noted. Pleasant and cooperative.  Head:  Normocephalic and atraumatic. Eyes:  Conjuctiva clear without scleral icterus. Heart:  S1, S2 present without murmurs, rubs, or gallops. Regular rate and rhythm. Abdomen:  +BS, soft, non-tender and non-distended. No rebound or guarding. Difficult to appreciate HSM due to large body habitus Msk:  Symmetrical without gross deformities. Normal posture. Extremities:  Without edema. Neurologic:  Alert and  oriented x4;  grossly normal neurologically. Skin:  Intact without significant lesions or rashes. Psych:  Alert and cooperative. Normal mood and affect.  Lab Results  Component Value Date   ALT 28 03/05/2014   AST 28 03/05/2014   ALKPHOS 95 03/05/2014   BILITOT 0.6 03/05/2014   Lab Results  Component Value Date   WBC 7.6 03/06/2014   HGB 9.5* 03/06/2014   HCT 31.1* 03/06/2014   MCV 77.8* 03/06/2014   PLT 135* 03/06/2014   Lab Results  Component Value Date   CREATININE 1.04 03/06/2014   BUN 19 03/06/2014   NA 139 03/06/2014   K 3.9 03/06/2014   CL 103 03/06/2014   CO2 27 03/06/2014   Lab Results  Component Value Date   IRON 19* 02/15/2014   TIBC 410 02/15/2014   FERRITIN 4* 02/15/2014

## 2014-04-02 NOTE — Patient Instructions (Signed)
Please call us with a date of when you are able to do the capsule study. You will need to stop taking iron a week prior to this. The night before, you will take only 1/2 dose of Lantus. Do not take your diabetes medication the actual day of the procedure until you are eating again.   Please complete the hepatitis A and B vaccinations with your primary care doctor.   Start taking a probiotic daily such as Align, Restora, Digestive Advantage, Philip's Colon health, wal[employer]'s brand.   I increased Inderal to 2 tablets once daily. Keep a monitor on how you feel. You may only be able to tolerate 1 tablet daily.   We will see you back in 6 months!

## 2014-04-18 ENCOUNTER — Encounter (HOSPITAL_COMMUNITY): Payer: Self-pay | Admitting: Pharmacy Technician

## 2014-05-04 ENCOUNTER — Encounter (HOSPITAL_COMMUNITY): Payer: Self-pay | Admitting: *Deleted

## 2014-05-04 ENCOUNTER — Ambulatory Visit (HOSPITAL_COMMUNITY)
Admission: RE | Admit: 2014-05-04 | Discharge: 2014-05-04 | Disposition: A | Discharge status: Home / Self Care | Payer: BC Managed Care – PPO | Source: Ambulatory Visit | Attending: Internal Medicine | Admitting: Internal Medicine

## 2014-05-04 ENCOUNTER — Encounter (HOSPITAL_COMMUNITY)
Admission: RE | Disposition: A | Discharge status: Home / Self Care | Payer: Self-pay | Source: Ambulatory Visit | Attending: Internal Medicine

## 2014-05-04 DIAGNOSIS — D509 Iron deficiency anemia, unspecified: Secondary | ICD-10-CM

## 2014-05-04 DIAGNOSIS — D649 Anemia, unspecified: Secondary | ICD-10-CM | POA: Insufficient documentation

## 2014-05-04 HISTORY — PX: GIVENS CAPSULE STUDY: SHX5432

## 2014-05-04 SURGERY — IMAGING PROCEDURE, GI TRACT, INTRALUMINAL, VIA CAPSULE

## 2014-05-09 ENCOUNTER — Telehealth: Payer: Self-pay | Admitting: Gastroenterology

## 2014-05-09 ENCOUNTER — Other Ambulatory Visit: Payer: Self-pay | Admitting: Gastroenterology

## 2014-05-09 DIAGNOSIS — R1310 Dysphagia, unspecified: Secondary | ICD-10-CM

## 2014-05-09 NOTE — Telephone Encounter (Signed)
I spoke with the pt and she is aware of results. She will call us when she gets back from [place] to schedule the KUB. She has not seen the capsule pass. Vicente Males is aware that pt is out of town today.

## 2014-05-09 NOTE — Telephone Encounter (Signed)
Order for KUB is in, the patient does not need an appointment she just needs to walk in Radiology

## 2014-05-09 NOTE — Procedures (Signed)
Small Bowel Givens Capsule Study Procedure date:  05/04/14  Referring Provider:  Laban Emperor, NP/Dr. Gala Romney PCP:  Dr. Bronson Curb, PA-C  Indication for procedure:   63 year old female with recent hospitalization for microcytic anemia requiring 2 units PRBCs, undergoing colonoscopy and EGD without significant evidence for etiology of IDA. History of NASH cirrhosis. Capsule study now planned for further evaluation.    Findings:   Unable to definitely identify passage into the cecum. Poor prep limited exam. Markedly abnormal study with multiple polypoid-like lesions noted starting around 1:16:50 throughout entire small bowel. Towards more distal small bowel, scattered superificial, non-bleeding erosions identified. Edematous-appearing small bowel with larger polyps noted around 2:52:42. Overall, abnormal capsule study but without actively bleeding lesions.    Summary & Recommendations: KUB now to document passage of capsule, as cecal images not definitely noted on images. Reviewed example images with Dr. Gala Romney. Will refer to [facility] for enteroscopy. Likely will need pan-enteroscopy due to abnormalities throughout entire small bowel.   Orvil Feil, ANP-BC [facility]

## 2014-05-09 NOTE — Telephone Encounter (Signed)
I tried to call pt- LM with husband, he said she was in [place] today because she had a grandchild born today. Her cell number is [phone], he said he wasn't sure if I would be able to reach her or not. Tried to call cell and no answer.

## 2014-05-09 NOTE — Telephone Encounter (Signed)
I have reviewed the capsule study. I can not definitively say that it reaches the cecum.  Needs KUB today.   Please also tell her that I will have Dr. Gala Romney review some images; it appears she has multiple tiny polyps in her small intestine. She will likely need referral to [facility] for an enteroscopy. I need to discuss this with Dr. Gala Romney first. Please make sure she is not taking any NSAIDs. She has some superficial erosions but no outright ulcerations.

## 2014-05-10 NOTE — Telephone Encounter (Signed)
Reviewed images with Dr. Gala Romney. She needs referral to [facility] due to multiple small polypoid lesions.   We need to make sure the capsule study is copied to a CD and mailed to [facility]. It is important they have this in their possession before her appt. Needs enteroscopy likely.

## 2014-05-10 NOTE — Telephone Encounter (Signed)
Penny Martinez, please refer pt- pt is aware that we were going to refer her, and make sure CD gets there. Thanks.

## 2014-05-10 NOTE — Procedures (Signed)
Pertinent images reviewed.  Agree with assessment and recommendations

## 2014-05-11 NOTE — Telephone Encounter (Signed)
Patient is schedule with Dr. Arsenio Loader at [facility] on Nov 18th at 12:00 and she is on a cancellation list, Mrs. Houchin is aware and records have been sent to [facility]

## 2014-06-25 ENCOUNTER — Telehealth: Payer: Self-pay | Admitting: Internal Medicine

## 2014-06-25 NOTE — Telephone Encounter (Signed)
PATIENT ON November RECALL LIST FOR ULTRASOUND

## 2014-06-25 NOTE — Telephone Encounter (Signed)
Letter mailed

## 2014-06-28 ENCOUNTER — Telehealth: Payer: Self-pay | Admitting: Internal Medicine

## 2014-06-28 DIAGNOSIS — K7469 Other cirrhosis of liver: Secondary | ICD-10-CM

## 2014-06-28 NOTE — Telephone Encounter (Signed)
Patient received a letter to be scheduled for an U/S ABD.  (Patient would not give me her phone number that we should have it in her file) When she called it came up as 'private' Please return her call [phone]

## 2014-06-29 NOTE — Telephone Encounter (Signed)
I was able to reach the patient by dialing [phone] with the phone number.

## 2014-06-29 NOTE — Telephone Encounter (Signed)
Patient is scheduled for her ultrasound August 08, 2014 at 9:00 am, arrive at 8:45 and NPO after midnight.

## 2014-06-29 NOTE — Telephone Encounter (Signed)
I tried to call the number listed for the patient, however it's not a working number.

## 2014-06-29 NOTE — Telephone Encounter (Signed)
Per Tabitha with [facility] she will send out a appt letter to the patient today

## 2014-07-25 ENCOUNTER — Telehealth: Payer: Self-pay

## 2014-07-25 NOTE — Telephone Encounter (Signed)
Penny Martinez called this morning from [facility] to see if there was a PA done for her GIVENS. I told her I would call the insurance company to find out and call her back at [phone]. I talked with the insurance company and it does not look like one was started. Talked back with Penny Martinez and she said that she will noted that there was not PA done.

## 2014-07-25 NOTE — Telephone Encounter (Deleted)
Pt had GIVENS on 05/04/14. Penny Martinez from [place] at her number is [phone]. I can not find any information about a PA being done for the GIVENS.

## 2014-07-25 NOTE — Telephone Encounter (Signed)
Noted  

## 2014-07-31 ENCOUNTER — Telehealth: Payer: Self-pay | Admitting: Gastroenterology

## 2014-07-31 NOTE — Telephone Encounter (Signed)
Received an abdominal xray dated Jul 27, 2014 from [facility]. Unclear why this was ordered several months after capsule study completed, but there is no evidence of capsule.   Orvil Feil, ANP-BC [facility]

## 2014-08-08 ENCOUNTER — Ambulatory Visit (HOSPITAL_COMMUNITY)
Admission: RE | Admit: 2014-08-08 | Discharge: 2014-08-08 | Disposition: A | Discharge status: Home / Self Care | Payer: BC Managed Care – PPO | Source: Ambulatory Visit | Attending: Gastroenterology | Admitting: Gastroenterology

## 2014-08-08 DIAGNOSIS — Z87891 Personal history of nicotine dependence: Secondary | ICD-10-CM | POA: Insufficient documentation

## 2014-08-08 DIAGNOSIS — K7460 Unspecified cirrhosis of liver: Secondary | ICD-10-CM | POA: Diagnosis not present

## 2014-08-08 DIAGNOSIS — I2510 Atherosclerotic heart disease of native coronary artery without angina pectoris: Secondary | ICD-10-CM | POA: Insufficient documentation

## 2014-08-08 DIAGNOSIS — I10 Essential (primary) hypertension: Secondary | ICD-10-CM | POA: Insufficient documentation

## 2014-08-08 DIAGNOSIS — E119 Type 2 diabetes mellitus without complications: Secondary | ICD-10-CM | POA: Insufficient documentation

## 2014-08-08 DIAGNOSIS — E785 Hyperlipidemia, unspecified: Secondary | ICD-10-CM | POA: Insufficient documentation

## 2014-08-08 DIAGNOSIS — K7469 Other cirrhosis of liver: Secondary | ICD-10-CM

## 2014-08-08 DIAGNOSIS — Z9049 Acquired absence of other specified parts of digestive tract: Secondary | ICD-10-CM | POA: Insufficient documentation

## 2014-08-08 DIAGNOSIS — R161 Splenomegaly, not elsewhere classified: Secondary | ICD-10-CM | POA: Insufficient documentation

## 2014-08-08 DIAGNOSIS — K589 Irritable bowel syndrome without diarrhea: Secondary | ICD-10-CM | POA: Insufficient documentation

## 2014-08-22 NOTE — Progress Notes (Signed)
Quick Note:  No liver mass noted. [place] in 6 months. ______

## 2014-09-04 ENCOUNTER — Encounter: Payer: Self-pay | Admitting: Internal Medicine

## 2015-01-16 ENCOUNTER — Telehealth: Payer: Self-pay | Admitting: General Practice

## 2015-01-16 NOTE — Telephone Encounter (Signed)
Patient wanted to ask RMR if it would be okay for her to take Zetia 10 mg once daily for her cholesterol considering she has cirrhosis.  Please advise?

## 2015-01-18 NOTE — Telephone Encounter (Signed)
Okay to try as long as PCP follows LFTs

## 2015-01-18 NOTE — Telephone Encounter (Signed)
Patient made aware of RMR's recommendations

## 2015-01-22 ENCOUNTER — Telehealth: Payer: Self-pay | Admitting: Internal Medicine

## 2015-01-22 NOTE — Telephone Encounter (Signed)
ON June RECALL FOR ULTRASOUND ABDOMEN

## 2015-01-23 ENCOUNTER — Other Ambulatory Visit: Payer: Self-pay

## 2015-01-23 DIAGNOSIS — K7460 Unspecified cirrhosis of liver: Secondary | ICD-10-CM

## 2015-01-25 NOTE — Telephone Encounter (Signed)
Mailed letter °

## 2015-02-13 ENCOUNTER — Other Ambulatory Visit (HOSPITAL_COMMUNITY): Payer: Self-pay

## 2015-02-18 ENCOUNTER — Ambulatory Visit (HOSPITAL_COMMUNITY)
Admission: RE | Admit: 2015-02-18 | Discharge: 2015-02-18 | Disposition: A | Discharge status: Home / Self Care | Payer: BLUE CROSS/BLUE SHIELD | Source: Ambulatory Visit | Attending: Gastroenterology | Admitting: Gastroenterology

## 2015-02-18 DIAGNOSIS — K7460 Unspecified cirrhosis of liver: Secondary | ICD-10-CM

## 2015-02-18 DIAGNOSIS — R161 Splenomegaly, not elsewhere classified: Secondary | ICD-10-CM | POA: Diagnosis not present

## 2015-02-18 DIAGNOSIS — B1920 Unspecified viral hepatitis C without hepatic coma: Secondary | ICD-10-CM | POA: Insufficient documentation

## 2015-02-19 NOTE — Progress Notes (Signed)
Quick Note:  Pt is aware. Routing to Penny Martinez to nic for repeat in 6 months! ______

## 2015-02-19 NOTE — Progress Notes (Signed)
Quick Note:  [place] negative for [place]. Repeat in 6 months. ______

## 2015-05-13 LAB — HEMOGLOBIN A1C: HEMOGLOBIN A1C: 8.2 % — AB (ref 4.0–6.0)

## 2015-06-13 ENCOUNTER — Other Ambulatory Visit: Payer: Self-pay

## 2015-06-13 MED ORDER — INSULIN ASPART 100 UNIT/ML FLEXPEN: 50.0000 [IU] | PEN_INJECTOR | Freq: Three times a day (TID) | SUBCUTANEOUS | Status: DC

## 2015-06-17 ENCOUNTER — Other Ambulatory Visit: Payer: Self-pay

## 2015-06-21 ENCOUNTER — Ambulatory Visit: Payer: Self-pay | Admitting: "Endocrinology

## 2015-06-28 ENCOUNTER — Ambulatory Visit: Payer: Self-pay | Admitting: "Endocrinology

## 2015-07-11 LAB — HEMOGLOBIN A1C: Hemoglobin A1C: 8

## 2015-07-12 ENCOUNTER — Ambulatory Visit (INDEPENDENT_AMBULATORY_CARE_PROVIDER_SITE_OTHER): Payer: BLUE CROSS/BLUE SHIELD | Admitting: "Endocrinology

## 2015-07-12 ENCOUNTER — Encounter: Payer: Self-pay | Admitting: "Endocrinology

## 2015-07-12 VITALS — BP 131/74 | HR 81 | Ht 61.5 in | Wt 206.0 lb

## 2015-07-12 DIAGNOSIS — E118 Type 2 diabetes mellitus with unspecified complications: Secondary | ICD-10-CM | POA: Diagnosis not present

## 2015-07-12 DIAGNOSIS — E039 Hypothyroidism, unspecified: Secondary | ICD-10-CM | POA: Diagnosis not present

## 2015-07-12 DIAGNOSIS — Z794 Long term (current) use of insulin: Secondary | ICD-10-CM

## 2015-07-12 DIAGNOSIS — I10 Essential (primary) hypertension: Secondary | ICD-10-CM

## 2015-07-12 DIAGNOSIS — E1165 Type 2 diabetes mellitus with hyperglycemia: Secondary | ICD-10-CM | POA: Diagnosis not present

## 2015-07-12 DIAGNOSIS — E6609 Other obesity due to excess calories: Secondary | ICD-10-CM | POA: Diagnosis not present

## 2015-07-12 DIAGNOSIS — IMO0002 Reserved for concepts with insufficient information to code with codable children: Secondary | ICD-10-CM

## 2015-07-12 DIAGNOSIS — Z6841 Body Mass Index (BMI) 40.0 and over, adult: Secondary | ICD-10-CM

## 2015-07-12 MED ORDER — LEVOTHYROXINE SODIUM 75 MCG PO TABS: 75.0000 ug | ORAL_TABLET | Freq: Every day | ORAL | Status: DC

## 2015-07-12 NOTE — Patient Instructions (Signed)

## 2015-07-13 NOTE — Progress Notes (Signed)
Subjective:    Patient ID: Penny Martinez, female    DOB: [birth year],    Past Medical History  Diagnosis Date  . Diabetes mellitus without complication ([place])   . COPD (chronic obstructive pulmonary disease) ([place])   . Hypertension   . Hypercholesteremia   . Acid reflux   . IBS (irritable bowel syndrome)   . CHF (congestive heart failure) ([place])   . 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 Negative viral markers 2015  . C. difficile colitis June/July 2015   Past Surgical History  Procedure Laterality Date  . 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 Abdominal hysterectomy    . Cholecystectomy    . Appendectomy    . Esophagogastroduodenoscopy N/A 02/19/2014    Dr. Gala Romney: esophageal varices, multiple gastric polyps with largest biopsied and hyperplastic. Negative H.pylori  . Colonoscopy N/A 02/19/2014    Dr. Gala Romney: rectal varices, rectal polyp overlying a varix non-manipulated  . Urosepsis    . Givens capsule study N/A 05/04/2014    Procedure: GIVENS CAPSULE STUDY;  Surgeon: Daneil Dolin, MD;  Location: [facility];  Service: Endoscopy;  Laterality: N/A;  730   Social History   Social History  . Marital Status: Married    Spouse Name: N/A  . Number of Children: N/A  . Years of Education: N/A   Social History Main Topics  . Smoking status: Former Smoker -- 2.00 packs/day for 35 years    Quit date: 05/05/2003  . Smokeless tobacco: Former Systems developer     Comment: Quit smoking 11 years ago  . Alcohol Use: No  . Drug Use: No  . Sexual Activity: Not Currently    Birth Control/ Protection: Surgical   Other Topics Concern  . None   Social History Narrative   Outpatient Encounter Prescriptions as of 07/12/2015  Medication Sig  . aspirin 81 MG tablet Take 81 mg by mouth daily.  . benazepril (LOTENSIN) 20 MG tablet Take 20 mg by mouth daily.  . diphenhydrAMINE (SOMINEX) 25 MG tablet Take by mouth at bedtime. Takes two tablets at bedtime  . diphenoxylate-atropine  (LOMOTIL) 2.5-0.025 MG per tablet Take by mouth 4 (four) times daily as needed for diarrhea or loose stools. One daily  . ferrous sulfate 325 (65 FE) MG tablet Take 325 mg by mouth 2 (two) times daily.  . furosemide (LASIX) 80 MG tablet Take 80 mg by mouth daily.  Marland Kitchen gabapentin (NEURONTIN) 300 MG capsule Take 300 mg by mouth 3 (three) times daily.  . insulin regular human CONCENTRATED (HUMULIN R) 500 UNIT/ML injection Inject 60 Units into the skin 3 (three) times daily before meals.  . lansoprazole (PREVACID) 30 MG capsule Take 30 mg by mouth daily.  Marland Kitchen levothyroxine (SYNTHROID, LEVOTHROID) 75 MCG tablet Take 1 tablet (75 mcg total) by mouth daily before breakfast.  . Liraglutide (VICTOZA) 18 MG/3ML SOPN Inject 1.8 mg into the skin daily.   . Multiple Vitamins-Minerals (CENTRUM SILVER PO) Take 1 tablet by mouth daily.   . potassium chloride (K-DUR) 10 MEQ tablet Take 10 mEq by mouth daily.  . Probiotic Product (ALIGN) 4 MG CAPS Take 1 capsule by mouth daily.  . propranolol (INDERAL) 10 MG tablet Take 2 tablets (20 mg total) by mouth daily.  . [DISCONTINUED] insulin aspart (NOVOLOG FLEXPEN) 100 UNIT/ML FlexPen Inject 50 Units into the skin 3 (three) times daily with meals. (Patient taking differently: Inject 40 Units into the skin 3 (three)  times daily with meals. )  . [DISCONTINUED] insulin glargine (LANTUS) 100 UNIT/ML injection Inject 100 Units into the skin at bedtime.   . [DISCONTINUED] levothyroxine (SYNTHROID, LEVOTHROID) 75 MCG tablet Take 75 mcg by mouth daily before breakfast.   No facility-administered encounter medications on file as of 07/12/2015.   ALLERGIES: Allergies  Allergen Reactions  . Niacin Rash and Shortness Of Breath  . Penicillins Shortness Of Breath  . Erythromycin Hives  . Statins     Other reaction(s): Muscle Pain  . Sulfa Antibiotics    VACCINATION STATUS:  There is no immunization history on file for this patient.  Diabetes She presents for her follow-up  diabetic visit. She has type 2 diabetes mellitus. Onset time: She was diagnosed at approximate age of 39 years. There are no hypoglycemic associated symptoms. Pertinent negatives for hypoglycemia include no confusion, headaches, pallor or seizures. Associated symptoms include fatigue, polydipsia and polyuria. Pertinent negatives for diabetes include no chest pain and no polyphagia. There are no hypoglycemic complications. Symptoms are worsening. Diabetic complications include nephropathy and PVD. Risk factors for coronary artery disease include diabetes mellitus, dyslipidemia, obesity, hypertension, sedentary lifestyle and tobacco exposure. Current diabetic treatment includes intensive insulin program. She is compliant with treatment most of the time. Her weight is stable. She is following a generally unhealthy diet. Her breakfast blood glucose range is generally >200 mg/dl. Her lunch blood glucose range is generally >200 mg/dl. Her dinner blood glucose range is generally >200 mg/dl. Her overall blood glucose range is >200 mg/dl. An ACE inhibitor/angiotensin II receptor blocker is being taken. Eye exam is current.  Hypertension This is a chronic problem. The current episode started more than 1 year ago. The problem is controlled. Pertinent negatives include no chest pain, headaches, palpitations or shortness of breath. Risk factors for coronary artery disease include diabetes mellitus, dyslipidemia, obesity and sedentary lifestyle. Hypertensive end-organ damage includes PVD.  Hyperlipidemia This is a chronic problem. The current episode started more than 1 year ago. Pertinent negatives include no chest pain, myalgias or shortness of breath. Risk factors for coronary artery disease include diabetes mellitus, dyslipidemia, hypertension, a sedentary lifestyle and obesity.     Review of Systems  Constitutional: Positive for fatigue. Negative for unexpected weight change.  HENT: Negative for trouble swallowing  and voice change.   Eyes: Negative for visual disturbance.  Respiratory: Negative for cough, shortness of breath and wheezing.   Cardiovascular: Negative for chest pain, palpitations and leg swelling.  Gastrointestinal: Negative for nausea, vomiting and diarrhea.  Endocrine: Positive for polydipsia and polyuria. Negative for cold intolerance, heat intolerance and polyphagia.  Musculoskeletal: Negative for myalgias and arthralgias.  Skin: Negative for color change, pallor, rash and wound.  Neurological: Negative for seizures and headaches.  Psychiatric/Behavioral: Negative for suicidal ideas and confusion.    Objective:    BP 131/74 mmHg  Pulse 81  Ht 5' 1.5" (1.562 m)  Wt 206 lb (93.441 kg)  BMI 38.30 kg/m2  SpO2 95%  Wt Readings from Last 3 Encounters:  07/12/15 206 lb (93.441 kg)  04/02/14 189 lb 9.6 oz (86.002 kg)  03/05/14 192 lb 3.9 oz (87.2 kg)    Physical Exam  Constitutional: She is oriented to person, place, and time. She appears well-developed.  HENT:  Head: Normocephalic and atraumatic.  Eyes: EOM are normal.  Neck: Normal range of motion. Neck supple. No tracheal deviation present. No thyromegaly present.  Cardiovascular: Normal rate and regular rhythm.   Pulmonary/Chest: Effort normal and breath sounds  normal.  Abdominal: Soft. Bowel sounds are normal. There is no tenderness. There is no guarding.  Musculoskeletal: Normal range of motion. She exhibits no edema.  Neurological: She is alert and oriented to person, place, and time. She has normal reflexes. No cranial nerve deficit. Coordination normal.  Skin: Skin is warm and dry. No rash noted. No erythema. No pallor.  Psychiatric: She has a normal mood and affect. Judgment normal.    Results for orders placed or performed in visit on 07/12/15  Hemoglobin A1c  Result Value Ref Range   Hgb A1c MFr Bld 8.2 (A) 4.0 - 6.0 %   Complete Blood Count (Most recent): Lab Results  Component Value Date   WBC 7.6  03/06/2014   HGB 9.5* 03/06/2014   HCT 31.1* 03/06/2014   MCV 77.8* 03/06/2014   PLT 135* 03/06/2014    Diabetic Labs (most recent): Lab Results  Component Value Date   HGBA1C 8.2* 05/13/2015   HGBA1C 7.9* 02/15/2014   Lipid profile (most recent): No results found for: TRIG, CHOL    Assessment & Plan:   1. Uncontrolled type 2 diabetes mellitus with complication, with long-term current use of insulin ([place])  Her diabetes is  complicated by chronic kidney disease and peripheral arterial disease.  patient remains at a high risk for more acute and chronic complications of diabetes which include CAD, CVA, CKD, retinopathy, and neuropathy. These are all discussed in detail with the patient.  Patient came with persistently elevated glucose profile, and  recent A1c of 8.2 %.  Glucose logs and insulin administration records pertaining to this visit,  to be scanned into patient's records.  Recent labs reviewed.   - I have re-counseled the patient on diet management and weight loss  by adopting a carbohydrate restricted / protein rich  Diet.  - Suggestion is made for patient to avoid simple carbohydrates   from their diet including Cakes , Desserts, Ice Cream,  Soda (  diet and regular) , Sweet Tea , Candies,  Chips, Cookies, Artificial Sweeteners,   and "Sugar-free" Products .  This will help patient to have stable blood glucose profile and potentially avoid unintended  Weight gain.  - Patient is advised to stick to a routine mealtimes to eat 3 meals  a day and avoid unnecessary snacks ( to snack only to correct hypoglycemia).   - I have approached patient with the following individualized plan to manage diabetes and patient agrees.  - He is due on her insulin apartment she will be switched to  U500. -I will initiate humiliating U500 60 units 3 times a day before meals for pre-meal glucose profile above 90 mg/dL associated with monitoring of blood glucose before meals and at bedtime. -She  will discontinue Toujeo and NovoLog. -She will continue Victoza 1.8 mg.  -she declines to explore option of bariatric surgery.  -Patient is encouraged to call clinic for blood glucose levels less than 70 or above 300 mg /dl.  -I will send her to penny Crumpton, CDE for dietary /DM education.  -Advised to avoid unnecessary evening snacks.    -Patient is not a candidate for metformin andSGLT2 inhibitorsdue to CKD.   - Patient specific target  for A1c; LDL, HDL, Triglycerides, and  Waist Circumference were discussed in detail.  2) BP/HTN:  controlled. Continue current medications including ACEI/ARB. 3) Lipids/HPL:  continue statins. 4)  Weight/Diet: CDE consult in progress, exercise, and carbohydrates information provided.  4) Primary hypothyroidism She is euthyroid. Heart thyroid function  tests are consistent with appropriate replacement. I advised her to continue levothyroxine 75 g by mouth every morning.  - We discussed about correct intake of levothyroxine, at fasting, with water, separated by at least 30 minutes from breakfast, and separated by more than 4 hours from calcium, iron, multivitamins, acid reflux medications (PPIs). -Patient is made aware of the fact that thyroid hormone replacement is needed for life, dose to be adjusted by periodic monitoring of thyroid function tests.  6) Chronic Care/Health Maintenance:  -Patient is  on ACEI/ARB and Statin medications and encouraged to continue to follow up with Ophthalmology, Podiatrist at least yearly or according to recommendations, and advised to  stay away from smoking. I have recommended yearly flu vaccine and pneumonia vaccination at least every 5 years; moderate intensity exercise for up to 150 minutes weekly; and  sleep for at least 7 hours a day.  I advised patient to maintain close follow up with their PCP for primary care needs.  Patient is asked to bring meter and  blood glucose logs during their next visit.   Follow  up plan: Return in about 10 weeks (around 09/20/2015) for diabetes, high blood pressure, high cholesterol, underactive thyroid, follow up with pre-visit labs, meter, and logs.  Glade Lloyd, MD Phone: [phone]  Fax: [phone]   07/13/2015, 8:22 PM

## 2015-07-17 ENCOUNTER — Telehealth: Payer: Self-pay

## 2015-07-17 NOTE — Telephone Encounter (Signed)
Pt.notified

## 2015-07-17 NOTE — Telephone Encounter (Signed)
Please, advise to increase U500 to 70 units TIDAC and call back if still higher than 300 x 3.

## 2015-07-17 NOTE — Telephone Encounter (Signed)
Pt states they have had high BG readings.   Date Before breakfast Before lunch Before supper Bedtime  11/7 [phone]   11/8 257 330 406   11/9 346             Pt taking: Humulin R 500units/ml 60 units TIDAC & Victoza 1.57m qd

## 2015-07-19 ENCOUNTER — Ambulatory Visit: Payer: Self-pay | Admitting: "Endocrinology

## 2015-07-19 ENCOUNTER — Telehealth: Payer: Self-pay

## 2015-07-19 NOTE — Telephone Encounter (Signed)
Pt notified and agrees. 

## 2015-07-19 NOTE — Telephone Encounter (Signed)
Expand All Collapse All   Pt states they have had high BG readings.   Date Before breakfast Before lunch Before supper   11/10 [phone] at midnight  11/11 299                   Pt taking: Humulin R 500units/ml 70 units TIDAC & Victoza 1.53m qd       Pt states that she has severe nausea 5 min after taking each injection of the humulin R

## 2015-07-19 NOTE — Telephone Encounter (Signed)
Please advise, Pt to increase U500 to 80 units TIDAC, decrease Victoza to 1.2 mg daily, maintain hydration . Call back if glucose still above 300 x 3.

## 2015-07-22 ENCOUNTER — Telehealth: Payer: Self-pay

## 2015-07-22 NOTE — Telephone Encounter (Signed)
Pt notified and agreed

## 2015-07-22 NOTE — Telephone Encounter (Signed)
Advise patient to increase U500 to 90 units TIDAC , continue to monitor 4 times a day ( including bedtime), call back if still higher than 300 x 3.

## 2015-07-22 NOTE — Telephone Encounter (Signed)
Pt states they have had high BG readings.   Date Before breakfast Before lunch Before supper Bedtime  11/12 [phone]   11/13 [phone]   11/14 324             Pt taking:Humulin R u-500 80 units TIDAC & Victoza 1.2m qd

## 2015-07-24 ENCOUNTER — Telehealth: Payer: Self-pay | Admitting: Internal Medicine

## 2015-07-24 NOTE — Telephone Encounter (Signed)
Mailed letter °

## 2015-07-24 NOTE — Telephone Encounter (Signed)
December RECALL FOR ULTRASOUND

## 2015-07-30 ENCOUNTER — Telehealth: Payer: Self-pay

## 2015-07-30 NOTE — Telephone Encounter (Signed)
Pt states they have had high BG readings.   Date Before breakfast Before lunch Before supper Bedtime  11/20 [phone]   11/21 [phone]   11/22 273             Pt taking: Humulin R U-500 90 units TIDAC

## 2015-07-30 NOTE — Telephone Encounter (Signed)
Please advise, Increase U500 insulin to 100 units 3 times a day before meals. Also, advised to test at bedtime and call back if readings are still above 3003.

## 2015-07-30 NOTE — Telephone Encounter (Signed)
Pt notified and agrees. 

## 2015-08-08 ENCOUNTER — Other Ambulatory Visit: Payer: Self-pay | Admitting: *Deleted

## 2015-08-08 MED ORDER — INSULIN REGULAR HUMAN (CONC) 500 UNIT/ML ~~LOC~~ SOLN: 100.0000 [IU] | Freq: Three times a day (TID) | SUBCUTANEOUS | Status: DC

## 2015-08-09 ENCOUNTER — Other Ambulatory Visit: Payer: Self-pay

## 2015-08-09 DIAGNOSIS — K7460 Unspecified cirrhosis of liver: Secondary | ICD-10-CM

## 2015-08-16 ENCOUNTER — Ambulatory Visit (HOSPITAL_COMMUNITY)
Admission: RE | Admit: 2015-08-16 | Discharge: 2015-08-16 | Disposition: A | Discharge status: Home / Self Care | Payer: BLUE CROSS/BLUE SHIELD | Source: Ambulatory Visit | Attending: Gastroenterology | Admitting: Gastroenterology

## 2015-08-16 DIAGNOSIS — R161 Splenomegaly, not elsewhere classified: Secondary | ICD-10-CM | POA: Diagnosis not present

## 2015-08-16 DIAGNOSIS — K7460 Unspecified cirrhosis of liver: Secondary | ICD-10-CM | POA: Insufficient documentation

## 2015-08-16 DIAGNOSIS — Z9049 Acquired absence of other specified parts of digestive tract: Secondary | ICD-10-CM | POA: Insufficient documentation

## 2015-08-25 NOTE — Progress Notes (Signed)
Quick Note:  No HCC. Overdue for follow-up with [place]. ______

## 2015-08-26 ENCOUNTER — Encounter: Payer: Self-pay | Admitting: Internal Medicine

## 2015-08-27 ENCOUNTER — Encounter: Payer: Self-pay | Admitting: Internal Medicine

## 2015-08-27 NOTE — Progress Notes (Signed)
APPT MADE AND LETTER SENT  °

## 2015-09-11 ENCOUNTER — Other Ambulatory Visit: Payer: Self-pay | Admitting: "Endocrinology

## 2015-09-20 ENCOUNTER — Encounter: Payer: Self-pay | Admitting: "Endocrinology

## 2015-09-20 ENCOUNTER — Ambulatory Visit (INDEPENDENT_AMBULATORY_CARE_PROVIDER_SITE_OTHER): Payer: BLUE CROSS/BLUE SHIELD | Admitting: "Endocrinology

## 2015-09-20 VITALS — BP 119/71 | HR 76 | Ht 61.5 in | Wt 217.0 lb

## 2015-09-20 DIAGNOSIS — N183 Chronic kidney disease, stage 3 unspecified: Secondary | ICD-10-CM

## 2015-09-20 DIAGNOSIS — I10 Essential (primary) hypertension: Secondary | ICD-10-CM

## 2015-09-20 DIAGNOSIS — E038 Other specified hypothyroidism: Secondary | ICD-10-CM | POA: Diagnosis not present

## 2015-09-20 DIAGNOSIS — E1122 Type 2 diabetes mellitus with diabetic chronic kidney disease: Secondary | ICD-10-CM | POA: Diagnosis not present

## 2015-09-20 MED ORDER — INSULIN REGULAR HUMAN (CONC) 500 UNIT/ML ~~LOC~~ SOPN: PEN_INJECTOR | SUBCUTANEOUS | Status: DC

## 2015-09-20 MED ORDER — LEVOTHYROXINE SODIUM 88 MCG PO TABS: 88.0000 ug | ORAL_TABLET | Freq: Every day | ORAL | Status: DC

## 2015-09-20 NOTE — Progress Notes (Signed)
Subjective:    Patient ID: Penny Martinez, female    DOB: [birth year],    Past Medical History  Diagnosis Date  . Diabetes mellitus without complication ([place])   . COPD (chronic obstructive pulmonary disease) ([place])   . Hypertension   . Hypercholesteremia   . Acid reflux   . IBS (irritable bowel syndrome)   . CHF (congestive heart failure) ([place])   . 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 Negative viral markers 2015  . C. difficile colitis June/July 2015   Past Surgical History  Procedure Laterality Date  . 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 Abdominal hysterectomy    . Cholecystectomy    . Appendectomy    . Esophagogastroduodenoscopy N/A 02/19/2014    Dr. Gala Romney: esophageal varices, multiple gastric polyps with largest biopsied and hyperplastic. Negative H.pylori  . Colonoscopy N/A 02/19/2014    Dr. Gala Romney: rectal varices, rectal polyp overlying a varix non-manipulated  . Urosepsis    . Givens capsule study N/A 05/04/2014    Procedure: GIVENS CAPSULE STUDY;  Surgeon: Daneil Dolin, MD;  Location: [facility];  Service: Endoscopy;  Laterality: N/A;  730   Social History   Social History  . Marital Status: Married    Spouse Name: N/A  . Number of Children: N/A  . Years of Education: N/A   Social History Main Topics  . Smoking status: Former Smoker -- 2.00 packs/day for 35 years    Quit date: 05/05/2003  . Smokeless tobacco: Former Systems developer     Comment: Quit smoking 11 years ago  . Alcohol Use: No  . Drug Use: No  . Sexual Activity: Not Currently    Birth Control/ Protection: Surgical   Other Topics Concern  . None   Social History Narrative   Outpatient Encounter Prescriptions as of 09/20/2015  Medication Sig  . aspirin 81 MG tablet Take 81 mg by mouth daily.  . benazepril (LOTENSIN) 20 MG tablet Take 20 mg by mouth daily.  . diphenhydrAMINE (SOMINEX) 25 MG tablet Take by mouth at bedtime. Takes two tablets at bedtime  . diphenoxylate-atropine  (LOMOTIL) 2.5-0.025 MG per tablet Take by mouth 4 (four) times daily as needed for diarrhea or loose stools. One daily  . ferrous sulfate 325 (65 FE) MG tablet Take 325 mg by mouth 2 (two) times daily.  . furosemide (LASIX) 80 MG tablet Take 80 mg by mouth daily.  Marland Kitchen gabapentin (NEURONTIN) 300 MG capsule Take 300 mg by mouth 3 (three) times daily.  . Insulin Pen Needle (QC PEN NEEDLES) 31G X 8 MM MISC Use as directed 3 x daily  . insulin regular human CONCENTRATED (HUMULIN R U-500 KWIKPEN) 500 UNIT/ML kwikpen 120 units with breakfast, 120 units with lunch, and 110 units with supper when pre-meal glucose is above 90 mg/dl.  . lansoprazole (PREVACID) 30 MG capsule Take 30 mg by mouth daily.  Marland Kitchen levothyroxine (SYNTHROID, LEVOTHROID) 88 MCG tablet Take 1 tablet (88 mcg total) by mouth daily before breakfast.  . Liraglutide (VICTOZA) 18 MG/3ML SOPN Inject 1.8 mg into the skin daily.   . Multiple Vitamins-Minerals (CENTRUM SILVER PO) Take 1 tablet by mouth daily.   . potassium chloride (K-DUR) 10 MEQ tablet Take 10 mEq by mouth daily.  . Probiotic Product (ALIGN) 4 MG CAPS Take 1 capsule by mouth daily.  . propranolol (INDERAL) 10 MG tablet Take 2 tablets (20 mg total) by mouth daily.  . [DISCONTINUED] insulin  regular human CONCENTRATED (HUMULIN R) 500 UNIT/ML injection Inject 0.2 mLs (100 Units total) into the skin 3 (three) times daily before meals. (Patient taking differently: Inject 110 Units into the skin 3 (three) times daily before meals. )  . [DISCONTINUED] levothyroxine (SYNTHROID, LEVOTHROID) 75 MCG tablet Take 1 tablet (75 mcg total) by mouth daily before breakfast.   No facility-administered encounter medications on file as of 09/20/2015.   ALLERGIES: Allergies  Allergen Reactions  . Niacin Rash and Shortness Of Breath  . Penicillins Shortness Of Breath  . Erythromycin Hives  . Statins     Other reaction(s): Muscle Pain  . Sulfa Antibiotics    VACCINATION STATUS:  There is no  immunization history on file for this patient.  Diabetes She presents for her follow-up diabetic visit. She has type 2 diabetes mellitus. Onset time: She was diagnosed at approximate age of 41 years. Her disease course has been improving. There are no hypoglycemic associated symptoms. Pertinent negatives for hypoglycemia include no confusion, headaches, pallor or seizures. Associated symptoms include fatigue, polydipsia and polyuria. Pertinent negatives for diabetes include no chest pain and no polyphagia. There are no hypoglycemic complications. Symptoms are improving. Diabetic complications include heart disease, nephropathy and PVD. Risk factors for coronary artery disease include diabetes mellitus, dyslipidemia, obesity, hypertension, sedentary lifestyle and tobacco exposure. Current diabetic treatment includes intensive insulin program. She is compliant with treatment most of the time. Her weight is increasing steadily. She is following a generally unhealthy diet. Prior visit with dietitian: She declined referral to a dietitian. She never participates in exercise. Her breakfast blood glucose range is generally >200 mg/dl. Her lunch blood glucose range is generally >200 mg/dl. Her dinner blood glucose range is generally 180-200 mg/dl. Her overall blood glucose range is >200 mg/dl. An ACE inhibitor/angiotensin II receptor blocker is being taken. Eye exam is current.  Hypertension This is a chronic problem. The current episode started more than 1 year ago. The problem is controlled. Pertinent negatives include no chest pain, headaches, palpitations or shortness of breath. Risk factors for coronary artery disease include diabetes mellitus, dyslipidemia, obesity and sedentary lifestyle. Hypertensive end-organ damage includes PVD.  Hyperlipidemia This is a chronic problem. The current episode started more than 1 year ago. Pertinent negatives include no chest pain, myalgias or shortness of breath. Risk factors  for coronary artery disease include diabetes mellitus, dyslipidemia, hypertension, a sedentary lifestyle and obesity.     Review of Systems  Constitutional: Positive for fatigue. Negative for unexpected weight change.  HENT: Negative for trouble swallowing and voice change.   Eyes: Negative for visual disturbance.  Respiratory: Negative for cough, shortness of breath and wheezing.   Cardiovascular: Negative for chest pain, palpitations and leg swelling.  Gastrointestinal: Negative for nausea, vomiting and diarrhea.  Endocrine: Positive for polydipsia and polyuria. Negative for cold intolerance, heat intolerance and polyphagia.  Musculoskeletal: Negative for myalgias and arthralgias.  Skin: Negative for color change, pallor, rash and wound.  Neurological: Negative for seizures and headaches.  Psychiatric/Behavioral: Negative for suicidal ideas and confusion.    Objective:    BP 119/71 mmHg  Pulse 76  Ht 5' 1.5" (1.562 m)  Wt 217 lb (98.431 kg)  BMI 40.34 kg/m2  SpO2 96%  Wt Readings from Last 3 Encounters:  09/20/15 217 lb (98.431 kg)  07/12/15 206 lb (93.441 kg)  04/02/14 189 lb 9.6 oz (86.002 kg)    Physical Exam  Constitutional: She is oriented to person, place, and time. She appears well-developed.  HENT:  Head: Normocephalic and atraumatic.  Eyes: EOM are normal.  Neck: Normal range of motion. Neck supple. No tracheal deviation present. No thyromegaly present.  Cardiovascular: Normal rate and regular rhythm.   Pulmonary/Chest: Effort normal and breath sounds normal.  Abdominal: Soft. Bowel sounds are normal. There is no tenderness. There is no guarding.  Musculoskeletal: Normal range of motion. She exhibits no edema.  Neurological: She is alert and oriented to person, place, and time. She has normal reflexes. No cranial nerve deficit. Coordination normal.  Skin: Skin is warm and dry. No rash noted. No erythema. No pallor.  Psychiatric: She has a normal mood and affect.  Judgment normal.    Results for orders placed or performed in visit on 09/20/15  Hemoglobin A1c  Result Value Ref Range   Hemoglobin A1C 8    Complete Blood Count (Most recent): Lab Results  Component Value Date   WBC 7.6 03/06/2014   HGB 9.5* 03/06/2014   HCT 31.1* 03/06/2014   MCV 77.8* 03/06/2014   PLT 135* 03/06/2014    Diabetic Labs (most recent): Lab Results  Component Value Date   HGBA1C 8 07/11/2015   HGBA1C 8.2* 05/13/2015   HGBA1C 7.9* 02/15/2014     Assessment & Plan:   1. Uncontrolled type 2 diabetes mellitus with complication, with long-term current use of insulin ([place])  Her diabetes is  complicated by chronic kidney disease and peripheral arterial disease as well as CHF.  patient remains at a high risk for more acute and chronic complications of diabetes which include CAD, CVA, CKD, retinopathy, and neuropathy. These are all discussed in detail with the patient.  Patient came with persistently elevated glucose profile despite high-dose of U500 insulin, and  recent A1c of 8 %.  Glucose logs and insulin administration records pertaining to this visit,  to be scanned into patient's records.  Recent labs reviewed.   - I have re-counseled the patient on diet management and weight loss  by adopting a carbohydrate restricted / protein rich  Diet.  - Suggestion is made for patient to avoid simple carbohydrates   from their diet including Cakes , Desserts, Ice Cream,  Soda (  diet and regular) , Sweet Tea , Candies,  Chips, Cookies, Artificial Sweeteners,   and "Sugar-free" Products .  This will help patient to have stable blood glucose profile and potentially avoid unintended  Weight gain.  - Patient is advised to stick to a routine mealtimes to eat 3 meals  a day and avoid unnecessary snacks ( to snack only to correct hypoglycemia).   - I have approached patient with the following individualized plan to manage diabetes and patient agrees.  - She will continue to  need U500 insulin. -I will  increase humulin  U500 to 120 units with breakfast, 120 units with lunch, and 110 units with supper for pre-meal glucose profile above 90 mg/dL associated with monitoring of blood glucose before meals and at bedtime. -She will continue Victoza 1.8 mg.  -Patient is encouraged to call clinic for blood glucose levels less than 70 or above 300 mg /dl.  -Advised to avoid unnecessary evening snacks.   -she is not interested  to explore option of bariatric surgery. -She declined referral to the dietitian.  -Patient is not a candidate for metformin andSGLT2 inhibitorsdue to CKD.   - Patient specific target  for A1c; LDL, HDL, Triglycerides, and  Waist Circumference were discussed in detail.  2) BP/HTN:  controlled. Continue current medications including  ACEI/ARB. 3) Lipids/HPL:  continue statins. 4)  Weight/Diet:  she decided not to consult with a dietitian, exercise, and carbohydrates information provided.  4) Primary hypothyroidism She is euthyroid. Heart thyroid function tests are consistent with appropriate replacement. I advised her to increase levothyroxine to 88g by mouth every morning.  - We discussed about correct intake of levothyroxine, at fasting, with water, separated by at least 30 minutes from breakfast, and separated by more than 4 hours from calcium, iron, multivitamins, acid reflux medications (PPIs). -Patient is made aware of the fact that thyroid hormone replacement is needed for life, dose to be adjusted by periodic monitoring of thyroid function tests.  6) Chronic Care/Health Maintenance:  -Patient is  on ACEI/ARB and Statin medications and encouraged to continue to follow up with Ophthalmology, Podiatrist at least yearly or according to recommendations, and advised to  stay away from smoking. I have recommended yearly flu vaccine and pneumonia vaccination at least every 5 years; moderate intensity exercise for up to 150 minutes weekly; and   sleep for at least 7 hours a day.  I advised patient to maintain close follow up with her PCP for primary care needs.  Patient is asked to bring meter and  blood glucose logs during their next visit.   Follow up plan: Return in about 3 months (around 12/19/2015) for diabetes, high blood pressure, high cholesterol, underactive thyroid, follow up with pre-visit labs, meter, and logs.  Glade Lloyd, MD Phone: [phone]  Fax: [phone]   09/20/2015, 2:01 PM

## 2015-09-20 NOTE — Patient Instructions (Signed)

## 2015-09-23 ENCOUNTER — Ambulatory Visit: Payer: BLUE CROSS/BLUE SHIELD | Admitting: Gastroenterology

## 2015-10-16 ENCOUNTER — Telehealth: Payer: Self-pay

## 2015-10-16 NOTE — Telephone Encounter (Signed)
Pt states they have had high BG readings. She also thinks that the severe fatigue and leg pains she has been having is coming from the U-500 insulin.   Date Before breakfast Before lunch Before supper Bedtime  2/6 [phone]   2/7 [phone]   2/8 [phone]            Pt taking: U-500   120 units TIDAC

## 2015-10-16 NOTE — Telephone Encounter (Signed)
She can increase U500 to 130 units 3 times a day before meals.

## 2015-10-16 NOTE — Telephone Encounter (Signed)
Pt agrees to increase. However she is requesting a call from Dr Dorris Fetch. She states she has not been feeling good on this insulin and wants to switch to something else.

## 2015-10-16 NOTE — Telephone Encounter (Signed)
Pt left message stating that she has had high BG readings. Pt was not available when I called back. Left message for her to call back with 3 days of BG readings.  Date Before breakfast Before lunch Before supper Bedtime                            Pt taking:

## 2015-10-17 NOTE — Telephone Encounter (Signed)
I called she was not home. I spoke with her husband to relay message in order for her to call us back tomorrow.

## 2015-10-31 ENCOUNTER — Other Ambulatory Visit: Payer: Self-pay | Admitting: "Endocrinology

## 2015-11-07 ENCOUNTER — Other Ambulatory Visit: Payer: Self-pay | Admitting: "Endocrinology

## 2015-12-16 ENCOUNTER — Ambulatory Visit (INDEPENDENT_AMBULATORY_CARE_PROVIDER_SITE_OTHER): Payer: BLUE CROSS/BLUE SHIELD | Admitting: Gastroenterology

## 2015-12-16 ENCOUNTER — Encounter: Payer: Self-pay | Admitting: Gastroenterology

## 2015-12-16 VITALS — BP 122/74 | HR 91 | Temp 97.0°F | Ht 61.0 in | Wt 220.2 lb

## 2015-12-16 DIAGNOSIS — K7469 Other cirrhosis of liver: Secondary | ICD-10-CM

## 2015-12-16 NOTE — Patient Instructions (Signed)
Take your iron in the middle of the day.   I will see what additional labs we need, if any.  We will send you a reminder to get your ultrasound done in the summer 2017.   We will see you in 6 months!

## 2015-12-16 NOTE — Progress Notes (Signed)
Referring Provider: No ref. provider found Primary Care Physician:  Antionette Fairy, PA-C  Primary GI: Dr. Gala Romney   Chief Complaint  Patient presents with  . Follow-up    HPI:   Penny Martinez is a 65 y.o. female presenting today with a history of likely NASH cirrhosis, overdue for office follow-up. Hospitalized in 2015 with anemia, requiring 2 units PRBCs. Colonoscopy and EGD without reason for IDA. Capsule study completed with multiple polypoid-like lesions throughout small bowel and scattered superficial non-bleeding erosions in more proximal small bowel. She was referred to [facility]. It appears they did not have the disc and wanted to repeat the capsule study; however, this would have been out of pocket. No further work-up since that time.   Completed Hep A/B vaccinations. Due for US abdomen in June 2017. Needs updated labs. On Inderal 20 mg daily. Has had a UTI since Jan. Been on multiple abx. Has a history of cdiff in past. Soft stool. No diarrhea. Concerned about having cdiff again in future. Insulin adjusted per Dr. Dorris Fetch. Has appt with Dr. Dorris Fetch upcoming and Urology on 4/11. Feels swollen. Diuretics managed by cardiology. Sees cardiology next week (Dr. Mathis Bud). Lomotil works well for her and would like a refill. Only uses sparingly. Has had to take some ibuprofen due to some back discomfort.   Past Medical History  Diagnosis Date  . Diabetes mellitus without complication ([place])   . COPD (chronic obstructive pulmonary disease) ([place])   . Hypertension   . Hypercholesteremia   . Acid reflux   . IBS (irritable bowel syndrome)   . CHF (congestive heart failure) ([place])   . 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 Negative viral markers 2015  . C. difficile colitis June/July 2015    Past Surgical History  Procedure Laterality Date  . 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 Abdominal hysterectomy    . Cholecystectomy    . Appendectomy    . Esophagogastroduodenoscopy  N/A 02/19/2014    Dr. Gala Romney: esophageal varices, multiple gastric polyps with largest biopsied and hyperplastic. Negative H.pylori  . Colonoscopy N/A 02/19/2014    Dr. Gala Romney: rectal varices, rectal polyp overlying a varix non-manipulated  . Urosepsis    . Givens capsule study N/A 05/04/2014    multiple polypoid-like lesions throughout small bowel, scattered superficial non-bleeding erosions more distal bowel, referred to [facility] for enteroscopy. Capsule study not complete, poor images.     Current Outpatient Prescriptions  Medication Sig Dispense Refill  . aspirin 81 MG tablet Take 81 mg by mouth daily.    . benazepril (LOTENSIN) 20 MG tablet Take 20 mg by mouth daily.    . cefdinir (OMNICEF) 300 MG capsule Take 300 mg by mouth 2 (two) times daily.    . diphenhydrAMINE (SOMINEX) 25 MG tablet Take by mouth at bedtime. Takes two tablets at bedtime    . diphenoxylate-atropine (LOMOTIL) 2.5-0.025 MG per tablet Take by mouth 4 (four) times daily as needed for diarrhea or loose stools. One daily    . ezetimibe (ZETIA) 10 MG tablet Take 10 mg by mouth daily.    . ferrous sulfate 325 (65 FE) MG tablet Take 325 mg by mouth 2 (two) times daily.    . furosemide (LASIX) 80 MG tablet Take 80 mg by mouth daily.    Marland Kitchen gabapentin (NEURONTIN) 300 MG capsule Take 300 mg by mouth 3 (three) times daily.    . Insulin Pen Needle (QC PEN  NEEDLES) 31G X 8 MM MISC Use as directed 3 x daily 100 each 2  . insulin regular human CONCENTRATED (HUMULIN R U-500 KWIKPEN) 500 UNIT/ML kwikpen Inject 130 Units into the skin 3 (three) times daily with meals. 18 mL 2  . lansoprazole (PREVACID) 30 MG capsule Take 30 mg by mouth daily.    Marland Kitchen levothyroxine (SYNTHROID, LEVOTHROID) 88 MCG tablet Take 1 tablet (88 mcg total) by mouth daily before breakfast. 30 tablet 3  . Multiple Vitamins-Minerals (CENTRUM SILVER PO) Take 1 tablet by mouth daily.     . potassium chloride (K-DUR) 10 MEQ tablet Take 10 mEq by mouth daily.    . Probiotic  Product (ALIGN) 4 MG CAPS Take 1 capsule by mouth daily.    . propranolol (INDERAL) 10 MG tablet Take 2 tablets (20 mg total) by mouth daily. 60 tablet 11  . VICTOZA 18 MG/3ML SOPN INJECT 1.8MG SUBCUTANEOUSLY ONCE DAILY 9 mL 2   No current facility-administered medications for this visit.    Allergies as of 12/16/2015 - Review Complete 12/16/2015  Allergen Reaction Noted  . Niacin Rash and Shortness Of Breath 03/05/2014  . Penicillins Shortness Of Breath 02/15/2014  . Erythromycin Hives 02/15/2014  . Statins  03/05/2014  . Sulfa antibiotics  02/15/2014    Family History  Problem Relation Age of Onset  . Colon cancer Neg Hx   . Diabetes Mother   . Hypertension Mother     Social History   Social History  . Marital Status: Married    Spouse Name: N/A  . Number of Children: N/A  . Years of Education: N/A   Social History Main Topics  . Smoking status: Former Smoker -- 2.00 packs/day for 35 years    Quit date: 05/05/2003  . Smokeless tobacco: Former Systems developer     Comment: Quit smoking 11 years ago  . Alcohol Use: No  . Drug Use: No  . Sexual Activity: Not Currently    Birth Control/ Protection: Surgical   Other Topics Concern  . None   Social History Narrative    Review of Systems: As mentioned in HPI.   Physical Exam: BP 122/74 mmHg  Pulse 91  Temp(Src) 97 F (36.1 C)  Ht 5' 1"  (1.549 m)  Wt 220 lb 3.2 oz (99.882 kg)  BMI 41.63 kg/m2 General:   Alert and oriented. No distress noted. Pleasant and cooperative.  Head:  Normocephalic and atraumatic. Eyes:  Conjuctiva clear without scleral icterus. Mouth:  Oral mucosa pink and moist. Good dentition. No lesions. Abdomen:  +BS, soft, non-tender and non-distended. Obese. Difficult to appreciate HSM due to larger AP diameter.  Extremities:  Trace bilateral lower extremity edema  Neurologic:  Alert and  oriented x4;  grossly normal neurologically Psych:  Alert and cooperative. Normal mood and affect.   Outside labs  April 2017: Hgb 8.5, Hct 27.2, Platelets 131.

## 2015-12-17 ENCOUNTER — Telehealth: Payer: Self-pay | Admitting: Gastroenterology

## 2015-12-17 NOTE — Telephone Encounter (Signed)
Received outside labs. Hgb is 8.5. We are missing some labs that are needed for routine cirrhosis care. She is going to [place] later this week for blood work. Can we fax the following and let her know these need to be done?   1. PT/INR 2. HFP 3. Iron, TIBC, ferritin.  Needs to keep taking iron BID. May need an iron transfusion. I will see what her actual iron levels are.

## 2015-12-18 ENCOUNTER — Other Ambulatory Visit: Payer: Self-pay | Admitting: "Endocrinology

## 2015-12-18 LAB — BASIC METABOLIC PANEL  EGFR
BUN/Creatinine Ratio: 28.9 Ratio — ABNORMAL HIGH (ref 6–22)
BUN: 33 mg/dL — AB (ref 7–25)
CHLORIDE: 97 mmol/L — AB (ref 98–110)
CO2: 27 mmol/L (ref 20–31)
CREATININE: 1.14 mg/dL — AB (ref 0.50–0.99)
Calcium: 8.9 mg/dL (ref 8.6–10.4)
GFR, Est African American: 59 mL/min — ABNORMAL LOW (ref >=60)
GFR, Est Non African American: 51 mL/min — ABNORMAL LOW (ref >=60)
GLUCOSE: 305 mg/dL — AB (ref 65–99)
Potassium: 4.9 mmol/L (ref 3.5–5.3)
Sodium: 133 mmol/L — ABNORMAL LOW (ref 135–146)

## 2015-12-18 LAB — HEMOGLOBIN A1C
Hemoglobin A1C: 7.6
Hgb A1c MFr Bld: 7.6 % — ABNORMAL HIGH (ref <5.7)
Mean Plasma Glucose: 171 mg/dL

## 2015-12-19 ENCOUNTER — Other Ambulatory Visit: Payer: Self-pay

## 2015-12-19 ENCOUNTER — Other Ambulatory Visit: Payer: Self-pay | Admitting: Gastroenterology

## 2015-12-19 ENCOUNTER — Telehealth: Payer: Self-pay | Admitting: Gastroenterology

## 2015-12-19 DIAGNOSIS — K7469 Other cirrhosis of liver: Secondary | ICD-10-CM

## 2015-12-19 LAB — T4, FREE: Free T4: 1.2 ng/dL (ref 0.8–1.8)

## 2015-12-19 LAB — TSH: TSH: 3.47 mIU/L

## 2015-12-19 NOTE — Assessment & Plan Note (Signed)
66 year old with likely NASH cirrhosis, well-compensated at this time. Due for [place] in June 2017. Needs updated HFP, INR. On Inderal for variceal prophylaxis. Return in 6 months for cirrhosis care. Also of note, history of IDA with colonoscopy/EGD on file, capsule study with multiple polypoid-like lesions and non-bleeding erosions in proximal small bowel. Unfortunately, [facility] wanted to repeat the capsule study as it was a poor study and incomplete to the cecum. Insurance will not cover this again. May need to pursue CTE. Most recent Hgb in 8 range: need to update with iron studies. May need hematology referral. Further recommendations after labs obtained.

## 2015-12-19 NOTE — Telephone Encounter (Signed)
Spoke with the pt, she wants me to mail her the orders and she will take it to her pcp and have it done. Lab orders done and in the mail to her.

## 2015-12-19 NOTE — Progress Notes (Deleted)
I received consult from [facility] Nov 2015. It appears they never received the disc. Patient tells me they wanted to repeat the capsule study.

## 2015-12-19 NOTE — Telephone Encounter (Signed)
Please have patient complete US abdomen in June 2017, [facility] screening, hx of cirrhosis.

## 2015-12-19 NOTE — Progress Notes (Signed)
cc'ed to pcp °

## 2015-12-23 ENCOUNTER — Ambulatory Visit: Payer: BLUE CROSS/BLUE SHIELD | Admitting: "Endocrinology

## 2015-12-23 NOTE — Telephone Encounter (Signed)
Pt is on recall list for [place]

## 2015-12-25 ENCOUNTER — Encounter: Payer: Self-pay | Admitting: "Endocrinology

## 2015-12-25 ENCOUNTER — Ambulatory Visit (INDEPENDENT_AMBULATORY_CARE_PROVIDER_SITE_OTHER): Payer: BLUE CROSS/BLUE SHIELD | Admitting: "Endocrinology

## 2015-12-25 VITALS — BP 134/72 | HR 80 | Ht 61.0 in | Wt 225.0 lb

## 2015-12-25 DIAGNOSIS — E038 Other specified hypothyroidism: Secondary | ICD-10-CM

## 2015-12-25 DIAGNOSIS — I10 Essential (primary) hypertension: Secondary | ICD-10-CM

## 2015-12-25 DIAGNOSIS — E1122 Type 2 diabetes mellitus with diabetic chronic kidney disease: Secondary | ICD-10-CM

## 2015-12-25 DIAGNOSIS — N183 Chronic kidney disease, stage 3 (moderate): Secondary | ICD-10-CM

## 2015-12-25 DIAGNOSIS — E6609 Other obesity due to excess calories: Secondary | ICD-10-CM

## 2015-12-25 NOTE — Progress Notes (Signed)
Subjective:    Patient ID: Penny Martinez, female    DOB: October 13, 1950,    Past Medical History  Diagnosis Date  . Diabetes mellitus without complication ([place])   . COPD (chronic obstructive pulmonary disease) ([place])   . Hypertension   . Hypercholesteremia   . Acid reflux   . IBS (irritable bowel syndrome)   . CHF (congestive heart failure) ([place])   . 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 Negative viral markers 2015  . C. difficile colitis June/July 2015   Past Surgical History  Procedure Laterality Date  . 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 Abdominal hysterectomy    . Cholecystectomy    . Appendectomy    . Esophagogastroduodenoscopy N/A 02/19/2014    Dr. Gala Romney: esophageal varices, multiple gastric polyps with largest biopsied and hyperplastic. Negative H.pylori  . Colonoscopy N/A 02/19/2014    Dr. Gala Romney: rectal varices, rectal polyp overlying a varix non-manipulated  . Urosepsis    . [place] capsule study N/A 05/04/2014    multiple polypoid-like lesions throughout small bowel, scattered superficial non-bleeding erosions more distal bowel, referred to [facility] for enteroscopy. Capsule study not complete, poor images.    Social History   Social History  . Marital Status: Married    Spouse Name: N/A  . Number of Children: N/A  . Years of Education: N/A   Social History Main Topics  . Smoking status: Former Smoker -- 2.00 packs/day for 35 years    Quit date: 05/05/2003  . Smokeless tobacco: Former Systems developer     Comment: Quit smoking 11 years ago  . Alcohol Use: No  . Drug Use: No  . Sexual Activity: Not Currently    Birth Control/ Protection: Surgical   Other Topics Concern  . None   Social History Narrative   Outpatient Encounter Prescriptions as of 12/25/2015  Medication Sig  . aspirin 81 MG tablet Take 81 mg by mouth daily.  . benazepril (LOTENSIN) 20 MG tablet Take 20 mg by mouth daily.  . cefdinir (OMNICEF) 300 MG capsule Take 300 mg by mouth 2  (two) times daily.  . diphenhydrAMINE (SOMINEX) 25 MG tablet Take by mouth at bedtime. Takes two tablets at bedtime  . diphenoxylate-atropine (LOMOTIL) 2.5-0.025 MG per tablet Take by mouth 4 (four) times daily as needed for diarrhea or loose stools. One daily  . ezetimibe (ZETIA) 10 MG tablet Take 10 mg by mouth daily.  . ferrous sulfate 325 (65 FE) MG tablet Take 325 mg by mouth 2 (two) times daily.  . furosemide (LASIX) 80 MG tablet Take 80 mg by mouth daily.  Marland Kitchen gabapentin (NEURONTIN) 300 MG capsule Take 300 mg by mouth 3 (three) times daily.  . Insulin Pen Needle (QC PEN NEEDLES) 31G X 8 MM MISC Use as directed 3 x daily  . insulin regular human CONCENTRATED (HUMULIN R U-500 KWIKPEN) 500 UNIT/ML kwikpen Inject 130 Units into the skin 3 (three) times daily with meals.  . lansoprazole (PREVACID) 30 MG capsule Take 30 mg by mouth daily.  Marland Kitchen levothyroxine (SYNTHROID, LEVOTHROID) 88 MCG tablet Take 1 tablet (88 mcg total) by mouth daily before breakfast.  . Multiple Vitamins-Minerals (CENTRUM SILVER PO) Take 1 tablet by mouth daily.   . potassium chloride (K-DUR) 10 MEQ tablet Take 10 mEq by mouth daily.  . Probiotic Product (ALIGN) 4 MG CAPS Take 1 capsule by mouth daily.  . propranolol (INDERAL) 10 MG tablet Take 2 tablets (20 mg  total) by mouth daily.  Marland Kitchen VICTOZA 18 MG/3ML SOPN INJECT 1.8MG SUBCUTANEOUSLY ONCE DAILY   No facility-administered encounter medications on file as of 12/25/2015.   ALLERGIES: Allergies  Allergen Reactions  . Niacin Rash and Shortness Of Breath  . Penicillins Shortness Of Breath  . Erythromycin Hives  . Statins     Other reaction(s): Muscle Pain  . Sulfa Antibiotics    VACCINATION STATUS:  There is no immunization history on file for this patient.  Diabetes She presents for her follow-up diabetic visit. She has type 2 diabetes mellitus. Onset time: She was diagnosed at approximate age of 48 years. Her disease course has been improving. There are no  hypoglycemic associated symptoms. Pertinent negatives for hypoglycemia include no confusion, headaches, pallor or seizures. Associated symptoms include fatigue, polydipsia and polyuria. Pertinent negatives for diabetes include no chest pain and no polyphagia. There are no hypoglycemic complications. Symptoms are improving. Diabetic complications include heart disease, nephropathy and PVD. Risk factors for coronary artery disease include diabetes mellitus, dyslipidemia, obesity, hypertension, sedentary lifestyle and tobacco exposure. Current diabetic treatment includes intensive insulin program. She is compliant with treatment most of the time. Her weight is increasing steadily. She is following a generally unhealthy diet. Prior visit with dietitian: She declined referral to a dietitian. She never participates in exercise. Her breakfast blood glucose range is generally 180-200 mg/dl. Her lunch blood glucose range is generally 180-200 mg/dl. Her dinner blood glucose range is generally 180-200 mg/dl. Her overall blood glucose range is 180-200 mg/dl. An ACE inhibitor/angiotensin II receptor blocker is being taken. Eye exam is current.  Hypertension This is a chronic problem. The current episode started more than 1 year ago. The problem is controlled. Pertinent negatives include no chest pain, headaches, palpitations or shortness of breath. Risk factors for coronary artery disease include diabetes mellitus, dyslipidemia, obesity and sedentary lifestyle. Hypertensive end-organ damage includes PVD.  Hyperlipidemia This is a chronic problem. The current episode started more than 1 year ago. Pertinent negatives include no chest pain, myalgias or shortness of breath. Risk factors for coronary artery disease include diabetes mellitus, dyslipidemia, hypertension, a sedentary lifestyle and obesity.     Review of Systems  Constitutional: Positive for fatigue. Negative for unexpected weight change.  HENT: Negative for  trouble swallowing and voice change.   Eyes: Negative for visual disturbance.  Respiratory: Negative for cough, shortness of breath and wheezing.   Cardiovascular: Negative for chest pain, palpitations and leg swelling.  Gastrointestinal: Negative for nausea, vomiting and diarrhea.  Endocrine: Positive for polydipsia and polyuria. Negative for cold intolerance, heat intolerance and polyphagia.  Musculoskeletal: Negative for myalgias and arthralgias.  Skin: Negative for color change, pallor, rash and wound.  Neurological: Negative for seizures and headaches.  Psychiatric/Behavioral: Negative for suicidal ideas and confusion.    Objective:    BP 134/72 mmHg  Pulse 80  Ht 5' 1"  (1.549 m)  Wt 225 lb (102.059 kg)  BMI 42.54 kg/m2  SpO2 96%  Wt Readings from Last 3 Encounters:  12/25/15 225 lb (102.059 kg)  12/16/15 220 lb 3.2 oz (99.882 kg)  09/20/15 217 lb (98.431 kg)    Physical Exam  Constitutional: She is oriented to person, place, and time. She appears well-developed.  HENT:  Head: Normocephalic and atraumatic.  Eyes: EOM are normal.  Neck: Normal range of motion. Neck supple. No tracheal deviation present. No thyromegaly present.  Cardiovascular: Normal rate and regular rhythm.   Pulmonary/Chest: Effort normal and breath sounds normal.  Abdominal:  Soft. Bowel sounds are normal. There is no tenderness. There is no guarding.  Musculoskeletal: Normal range of motion. She exhibits no edema.  Neurological: She is alert and oriented to person, place, and time. She has normal reflexes. No cranial nerve deficit. Coordination normal.  Skin: Skin is warm and dry. No rash noted. No erythema. No pallor.  Psychiatric: She has a normal mood and affect. Judgment normal.    Results for orders placed or performed in visit on 12/25/15  Hemoglobin A1c  Result Value Ref Range   Hemoglobin A1C 7.6    Complete Blood Count (Most recent): Lab Results  Component Value Date   WBC 7.6  03/06/2014   HGB 9.5* 03/06/2014   HCT 31.1* 03/06/2014   MCV 77.8* 03/06/2014   PLT 135* 03/06/2014    Diabetic Labs (most recent): Lab Results  Component Value Date   HGBA1C 7.6 12/18/2015   HGBA1C 8 07/11/2015   HGBA1C 8.2* 05/13/2015     Assessment & Plan:   1. Uncontrolled type 2 diabetes mellitus with complication, with long-term current use of insulin ([place])  Her diabetes is  complicated by chronic kidney disease and peripheral arterial disease as well as CHF.  patient remains at a high risk for more acute and chronic complications of diabetes which include CAD, CVA, CKD, retinopathy, and neuropathy. These are all discussed in detail with the patient.  Patient came with  Improved glucose profile and A1c of 7.6% on  U500 insulin.   Glucose logs and insulin administration records pertaining to this visit,  to be scanned into patient's records.  Recent labs reviewed.   - I have re-counseled the patient on diet management and weight loss  by adopting a carbohydrate restricted / protein rich  Diet.  - Suggestion is made for patient to avoid simple carbohydrates   from their diet including Cakes , Desserts, Ice Cream,  Soda (  diet and regular) , Sweet Tea , Candies,  Chips, Cookies, Artificial Sweeteners,   and "Sugar-free" Products .  This will help patient to have stable blood glucose profile and potentially avoid unintended  Weight gain.  - Patient is advised to stick to a routine mealtimes to eat 3 meals  a day and avoid unnecessary snacks ( to snack only to correct hypoglycemia).   - I have approached patient with the following individualized plan to manage diabetes and patient agrees.  - She will continue to need U500 insulin. -I will  increase humulin  U500 to 130 units with breakfast, 130 units with lunch, and 130 units with supper for pre-meal glucose profile above 90 mg/dL associated with monitoring of blood glucose before meals and at bedtime. -She will continue  Victoza 1.8 mg.  -Patient is encouraged to call clinic for blood glucose levels less than 70 or above 300 mg /dl.  -Advised to avoid unnecessary evening snacks.   -she is not interested  to explore option of bariatric surgery. -She declined referral to the dietitian.  -Patient is not a candidate for metformin andSGLT2 inhibitors due to CKD.   - Patient specific target  for A1c; LDL, HDL, Triglycerides, and  Waist Circumference were discussed in detail.  2) BP/HTN:  controlled. Continue current medications including ACEI/ARB. 3) Lipids/HPL:  continue statins. 4)  Weight/Diet:  she decided not to consult with a dietitian, exercise, and carbohydrates information provided.  4) Primary hypothyroidism She is euthyroid. Heart thyroid function tests are consistent with appropriate replacement. I advised her to increase levothyroxine to  88g by mouth every morning.  - We discussed about correct intake of levothyroxine, at fasting, with water, separated by at least 30 minutes from breakfast, and separated by more than 4 hours from calcium, iron, multivitamins, acid reflux medications (PPIs). -Patient is made aware of the fact that thyroid hormone replacement is needed for life, dose to be adjusted by periodic monitoring of thyroid function tests.  6) Chronic Care/Health Maintenance:  -Patient is  on ACEI/ARB and Statin medications and encouraged to continue to follow up with Ophthalmology, Podiatrist at least yearly or according to recommendations, and advised to  stay away from smoking. I have recommended yearly flu vaccine and pneumonia vaccination at least every 5 years; moderate intensity exercise for up to 150 minutes weekly; and  sleep for at least 7 hours a day.  I advised patient to maintain close follow up with her PCP for primary care needs.  Patient is asked to bring meter and  blood glucose logs during their next visit.   Follow up plan: Return in about 3 months (around 03/25/2016)  for diabetes, high blood pressure, high cholesterol, underactive thyroid, follow up with pre-visit labs, meter, and logs.  Glade Lloyd, MD Phone: [phone]  Fax: [phone]   12/25/2015, 1:23 PM

## 2015-12-25 NOTE — Patient Instructions (Signed)

## 2015-12-26 LAB — HEPATIC FUNCTION PANEL
ALT: 17 U/L (ref 6–29)
AST: 29 U/L (ref 10–35)
Albumin: 3.8 g/dL (ref 3.6–5.1)
Alkaline Phosphatase: 85 U/L (ref 33–130)
BILIRUBIN INDIRECT: 0.3 mg/dL (ref 0.2–1.2)
Bilirubin, Direct: 0.1 mg/dL (ref <=0.2)
TOTAL PROTEIN: 6.9 g/dL (ref 6.1–8.1)
Total Bilirubin: 0.4 mg/dL (ref 0.2–1.2)

## 2015-12-26 LAB — IRON AND TIBC
%SAT: 8 % — ABNORMAL LOW (ref 11–50)
Iron: 30 ug/dL — ABNORMAL LOW (ref 45–160)
TIBC: 395 ug/dL (ref 250–450)
UIBC: 365 ug/dL (ref 125–400)

## 2015-12-26 LAB — PROTIME-INR
INR: 1.01 (ref <1.50)
Prothrombin Time: 13.4 seconds (ref 11.6–15.2)

## 2015-12-26 LAB — FERRITIN: FERRITIN: 26 ng/mL (ref 20–288)

## 2015-12-30 ENCOUNTER — Encounter: Payer: Self-pay | Admitting: "Endocrinology

## 2016-01-03 ENCOUNTER — Other Ambulatory Visit (HOSPITAL_COMMUNITY): Payer: Self-pay | Admitting: Respiratory Therapy

## 2016-01-03 DIAGNOSIS — R0683 Snoring: Secondary | ICD-10-CM

## 2016-01-03 DIAGNOSIS — R5383 Other fatigue: Secondary | ICD-10-CM

## 2016-01-03 DIAGNOSIS — G4710 Hypersomnia, unspecified: Secondary | ICD-10-CM

## 2016-01-06 ENCOUNTER — Other Ambulatory Visit (HOSPITAL_COMMUNITY): Payer: Self-pay | Admitting: Respiratory Therapy

## 2016-01-07 NOTE — Progress Notes (Signed)
Quick Note:  Iron still low, ferritin better than before but still not ideal, LFTs normal. Her Hgb is in the 8 range, which is where she has been historically. I recommend referral to Hematology.  She had an abnormal capsule study in the past and was referred to [facility]. She tells me that insurance wouldn't approve another capsule study. A CTE could be pursued to rule out obvious small bowel tumor. If she is willing to do this, I would recommend it. Still recommend referral to Hematology with chronic IDA. ______

## 2016-01-08 ENCOUNTER — Other Ambulatory Visit: Payer: Self-pay | Admitting: "Endocrinology

## 2016-01-10 ENCOUNTER — Other Ambulatory Visit: Payer: Self-pay

## 2016-01-10 DIAGNOSIS — D509 Iron deficiency anemia, unspecified: Secondary | ICD-10-CM

## 2016-01-22 ENCOUNTER — Telehealth: Payer: Self-pay | Admitting: Internal Medicine

## 2016-01-22 NOTE — Telephone Encounter (Signed)
Mailed letter °

## 2016-01-22 NOTE — Telephone Encounter (Signed)
Recall for ultrasound 

## 2016-01-30 ENCOUNTER — Other Ambulatory Visit: Payer: Self-pay | Admitting: "Endocrinology

## 2016-01-31 ENCOUNTER — Encounter (HOSPITAL_COMMUNITY): Payer: Self-pay | Admitting: Hematology & Oncology

## 2016-01-31 ENCOUNTER — Encounter (HOSPITAL_COMMUNITY): Payer: BLUE CROSS/BLUE SHIELD | Attending: Hematology & Oncology | Admitting: Hematology & Oncology

## 2016-01-31 VITALS — BP 106/40 | HR 86 | Temp 98.8°F | Resp 18 | Ht 61.5 in | Wt 226.6 lb

## 2016-01-31 DIAGNOSIS — D509 Iron deficiency anemia, unspecified: Secondary | ICD-10-CM

## 2016-01-31 DIAGNOSIS — D732 Chronic congestive splenomegaly: Secondary | ICD-10-CM

## 2016-01-31 DIAGNOSIS — I8511 Secondary esophageal varices with bleeding: Secondary | ICD-10-CM

## 2016-01-31 DIAGNOSIS — D500 Iron deficiency anemia secondary to blood loss (chronic): Secondary | ICD-10-CM | POA: Insufficient documentation

## 2016-01-31 DIAGNOSIS — K7469 Other cirrhosis of liver: Secondary | ICD-10-CM

## 2016-01-31 NOTE — Patient Instructions (Signed)
[place] at [facility] Discharge Instructions  RECOMMENDATIONS MADE BY THE CONSULTANT AND ANY TEST RESULTS WILL BE SENT TO YOUR REFERRING PHYSICIAN.  You were seen by Dr. Whitney Muse today. You will receive 2 does of Fereheme (iron through an IV line). Each dose will be a week apart. You will have lab work completed 4 weeks after your last dose of iron. You will return for a follow-up visit after labs have been completed and see Dr. Whitney Muse. Call the clinic with any questions or concerns.   Thank you for choosing [place] at [facility] to provide your oncology and hematology care.  To afford each patient quality time with our provider, please arrive at least 15 minutes before your scheduled appointment time.   Beginning January 23rd 2017 lab work for the [facility] will be done in the  Main lab at [facility] on 1st floor. If you have a lab appointment with the [facility] please come in thru the  Main Entrance and check in at the main information desk  You need to re-schedule your appointment should you arrive 10 or more minutes late.  We strive to give you quality time with our providers, and arriving late affects you and other patients whose appointments are after yours.  Also, if you no show three or more times for appointments you may be dismissed from the clinic at the providers discretion.     Again, thank you for choosing [facility].  Our hope is that these requests will decrease the amount of time that you wait before being seen by our physicians.       _____________________________________________________________  Should you have questions after your visit to [facility], please contact our office at [phone] between the hours of 8:30 a.m. and 4:30 p.m.  Voicemails left after 4:30 p.m. will not be returned until the following business day.  For prescription refill requests, have your pharmacy contact our  office.         Resources For Cancer Patients and their Caregivers ? American Cancer Society: Can assist with transportation, wigs, general needs, runs Look Good Feel Better.        (778)132-0267 ? Cancer Care: Provides financial assistance, online support groups, medication/co-pay assistance.  1-800-813-HOPE 207-303-3596) ? [facility] Assists [place] cancer patients and their families through emotional , educational and financial support.  386-810-3128 ? [place] DSS Where to apply for food stamps, Medicaid and utility assistance. (941)567-1418 ? [facility]: Transportation to medical appointments. 405-490-8957 ? Social Security Administration: May apply for disability if have a Stage IV cancer. 782-348-3229 612-513-7502 ? [employer], Disability and Transit Services: Assists with nutrition, care and transit needs. [place] Support Programs: @10RELATIVEDAYS @ > Cancer Support Group  2nd Tuesday of the month 1pm-2pm, Journey Room  > Creative Journey  3rd Tuesday of the month 1130am-1pm, Journey Room  > Look Good Feel Better  1st Wednesday of the month 10am-12 noon, Journey Room (Call [place] to register [phone])

## 2016-01-31 NOTE — Progress Notes (Signed)
[place] NOTE  Patient Care Team: Antionette Fairy, PA-C as PCP - General (Physician Assistant) Daneil Dolin, MD as Consulting Physician (Gastroenterology)  CHIEF COMPLAINTS/PURPOSE OF CONSULTATION:  Iron deficiency anemia secondary to chronic GI related blood loss Rectal varices found on colonoscopy 2015 Cirrhosis secondary to NASH Esophageal varices found on EGD 2015 Hospitalized in 2015 secondary to symptomatic anemia requiring PRBC transfusion Capsule study with multiple polypoid like lesions throughout small bowel, scattered superficial non-bleeding erosions   HISTORY OF PRESENTING ILLNESS:  Penny Martinez 65 y.o. female is here because of referral by [facility] for anemia and iron deficiency.  Mrs. Eddleman is unaccompanied. I personally reviewed and went over laboratory studies with the patient.  She is taking iron pills. She has forgotten to take her iron tablets with other new medications before. She notes that at times her oral iron irritates her stomach and in addition there are a lot of "interactions" with other medications she is on. She at times finds it difficult to take it on a regular ongoing basis.   She had a pill cam with Dr. Gala Romney about two years ago. She notes the results were abnormal and she was referred to [facility]. She went to [facility] last year to have the same procedure done, however her insurance would not cover it. She notes that the pill cam alone was 18K.   She denies blood in stool. She does admit she has IBS so she occasionally notices bright red blood on tissue after wiping frequently. She craves ice all the time and has been for a long time. She also craved ice water when she was pregnant with her second child.   Reports bilateral leg swelling. Notes this swelling has become chronic and her feet will become numb. She denies ever having a heart attack. Also notes swelling in her thighs and hips, "I  can't wear any of my clothes".  She is currently seeing a new cardiologist and reports undergoing a cardiac evaluation.   She is up to date on screening mammograms. She is scheduled for a sleep study here on June 8th.   The patient is here for further evaluation and management of iron deficiency anemia.  MEDICAL HISTORY:  Past Medical History  Diagnosis Date  . Diabetes mellitus without complication ([place])   . COPD (chronic obstructive pulmonary disease) ([place])   . Hypertension   . Hypercholesteremia   . Acid reflux   . IBS (irritable bowel syndrome)   . CHF (congestive heart failure) ([place])   . Cirrhosis ([place])     likely due to NASH. Negative viral markers 2015  . C. difficile colitis June/July 2015    SURGICAL HISTORY: Past Surgical History  Procedure Laterality Date  . 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 Abdominal hysterectomy    . Cholecystectomy    . Appendectomy    . Esophagogastroduodenoscopy N/A 02/19/2014    Dr. Gala Romney: esophageal varices, multiple gastric polyps with largest biopsied and hyperplastic. Negative H.pylori  . Colonoscopy N/A 02/19/2014    Dr. Gala Romney: rectal varices, rectal polyp overlying a varix non-manipulated  . Urosepsis    . [place] capsule study N/A 05/04/2014    multiple polypoid-like lesions throughout small bowel, scattered superficial non-bleeding erosions more distal bowel, referred to [facility] for enteroscopy. Capsule study not complete, poor images.     SOCIAL HISTORY: Social History   Social History  . Marital Status: Married  Spouse Name: N/A  . Number of Children: N/A  . Years of Education: N/A   Occupational History  . Not on file.   Social History Main Topics  . Smoking status: Former Smoker -- 2.00 packs/day for 35 years    Quit date: 05/05/2003  . Smokeless tobacco: Former Systems developer     Comment: Quit smoking 11 years ago  . Alcohol Use: No  . Drug Use: No  . Sexual Activity: Not Currently    Birth Control/  Protection: Surgical   Other Topics Concern  . 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 ETOH, none She used to crossstitch but no longer can with her hands.  FAMILY HISTORY: Family History  Problem Relation Age of Onset  . Colon cancer Neg Hx   . Diabetes Mother   . Hypertension Mother    Mother was diabetic and ended up on dialysis, she died at 70 years-old Father died of an aneurysm at 82 years-old 34 brother and 1 sister Brother is borderline diabetic with heart issues Sister is fairly healthy  ALLERGIES:  is allergic to niacin; penicillins; erythromycin; statins; and sulfa antibiotics.  MEDICATIONS:  Current Outpatient Prescriptions  Medication Sig Dispense Refill  . aspirin 81 MG tablet Take 81 mg by mouth daily.    . benazepril (LOTENSIN) 20 MG tablet Take 20 mg by mouth daily.    . cefdinir (OMNICEF) 300 MG capsule Take 300 mg by mouth 2 (two) times daily.    . diphenhydrAMINE (SOMINEX) 25 MG tablet Take by mouth at bedtime. Takes two tablets at bedtime    . diphenoxylate-atropine (LOMOTIL) 2.5-0.025 MG per tablet Take by mouth 4 (four) times daily as needed for diarrhea or loose stools. One daily    . ezetimibe (ZETIA) 10 MG tablet Take 10 mg by mouth daily.    . ferrous sulfate 325 (65 FE) MG tablet Take 325 mg by mouth 2 (two) times daily.    . furosemide (LASIX) 80 MG tablet Take 80 mg by mouth daily.    Marland Kitchen gabapentin (NEURONTIN) 300 MG capsule Take 300 mg by mouth 3 (three) times daily.    Marland Kitchen HUMULIN R U-500 KWIKPEN 500 UNIT/ML kwikpen INJECT 130 UNITS INTO THE SKIN 3 TIMES DAILY WITH MEALS 18 mL 2  . Insulin Pen Needle (QC PEN NEEDLES) 31G X 8 MM MISC Use as directed 3 x daily 100 each 2  . lansoprazole (PREVACID) 30 MG capsule Take 30 mg by mouth daily.    Marland Kitchen levothyroxine (SYNTHROID, LEVOTHROID) 88 MCG tablet Take 1 tablet (88 mcg  total) by mouth daily before breakfast. 30 tablet 3  . Multiple Vitamins-Minerals (CENTRUM SILVER PO) Take 1 tablet by mouth daily.     . potassium chloride (K-DUR) 10 MEQ tablet Take 10 mEq by mouth daily.    . Probiotic Product (ALIGN) 4 MG CAPS Take 1 capsule by mouth daily.    . propranolol (INDERAL) 10 MG tablet Take 2 tablets (20 mg total) by mouth daily. 60 tablet 11  . VICTOZA 18 MG/3ML SOPN INJECT 1.8MG SUBCUTANEOUSLY ONCE DAILY 9 mL 2   No current facility-administered medications for this visit.    Review of Systems  Constitutional: Negative.  Negative for fever, chills, weight loss and malaise/fatigue.       Pagophagia.  HENT: Negative.  Negative for congestion, hearing loss, nosebleeds,  sore throat and tinnitus.   Eyes: Negative.  Negative for blurred vision, double vision, pain and discharge.  Respiratory: Negative.  Negative for cough, hemoptysis, sputum production, shortness of breath and wheezing.   Cardiovascular: Positive for leg swelling. Negative for chest pain, palpitations, claudication and PND.  Gastrointestinal: Negative.  Negative for heartburn, nausea, vomiting, abdominal pain, diarrhea, constipation, blood in stool and melena.       IBS  Genitourinary: Negative.  Negative for dysuria, urgency, frequency and hematuria.  Musculoskeletal: Negative.  Negative for myalgias, joint pain and falls.  Skin: Negative.  Negative for itching and rash.  Neurological: Negative.  Negative for dizziness, tingling, tremors, sensory change, speech change, focal weakness, seizures, loss of consciousness, weakness and headaches.  Endo/Heme/Allergies: Negative.  Does not bruise/bleed easily.  Psychiatric/Behavioral: Negative.  Negative for depression, suicidal ideas, memory loss and substance abuse. The patient is not nervous/anxious and does not have insomnia.   All other systems reviewed and are negative. 14 point ROS was done and is otherwise as detailed above or in  HPI   PHYSICAL EXAMINATION: ECOG PERFORMANCE STATUS: 1 - Symptomatic but completely ambulatory  Filed Vitals:   01/31/16 1426  BP: 106/40  Pulse: 86  Temp: 98.8 F (37.1 C)  Resp: 18   Filed Weights   01/31/16 1426  Weight: 226 lb 9.6 oz (102.785 kg)    Physical Exam  Constitutional: She is oriented to person, place, and time and well-developed, well-nourished, and in no distress.  Obese. Wears glasses.  HENT:  Head: Normocephalic and atraumatic.  Nose: Nose normal.  Mouth/Throat: Oropharynx is clear and moist. No oropharyngeal exudate.  Eyes: Conjunctivae and EOM are normal. Pupils are equal, round, and reactive to light. Right eye exhibits no discharge. Left eye exhibits no discharge. No scleral icterus.  Neck: Normal range of motion. Neck supple. No tracheal deviation present. No thyromegaly present.  Cardiovascular: Normal rate, regular rhythm and normal heart sounds.  Exam reveals no gallop and no friction rub.   No murmur heard. Pulmonary/Chest: Effort normal and breath sounds normal. She has no wheezes. She has no rales.  Abdominal: Soft. Bowel sounds are normal. She exhibits no distension and no mass. There is no tenderness. There is no rebound and no guarding.  Musculoskeletal: Normal range of motion. She exhibits edema.  Bilateral lower extremity woody edema (Chronic)  Lymphadenopathy:    She has no cervical adenopathy.  Neurological: She is alert and oriented to person, place, and time. She has normal reflexes. No cranial nerve deficit. Gait normal. Coordination normal.  Skin: Skin is warm and dry. No rash noted.  Psychiatric: Mood, memory, affect and judgment normal.  Nursing note and vitals reviewed.   LABORATORY DATA:  I have reviewed the data as listed Lab Results  Component Value Date   WBC 7.6 03/06/2014   HGB 9.5* 03/06/2014   HCT 31.1* 03/06/2014   MCV 77.8* 03/06/2014   PLT 135* 03/06/2014   CMP     Component Value Date/Time   NA 133*  12/18/2015 1645   K 4.9 12/18/2015 1645   CL 97* 12/18/2015 1645   CO2 27 12/18/2015 1645   GLUCOSE 305* 12/18/2015 1645   BUN 33* 12/18/2015 1645   CREATININE 1.14* 12/18/2015 1645   CREATININE 1.04 03/06/2014 0523   CALCIUM 8.9 12/18/2015 1645   PROT 6.9 12/25/2015 1234   ALBUMIN 3.8 12/25/2015 1234   AST 29 12/25/2015 1234   ALT 17 12/25/2015 1234   ALKPHOS 85 12/25/2015 1234  BILITOT 0.4 12/25/2015 1234   GFRNONAA 51* 12/18/2015 1645   GFRNONAA 56* 03/06/2014 0523   GFRAA 59* 12/18/2015 1645   GFRAA 65* 03/06/2014 0523   Results for LUCI, BELLUCCI (MRN [id number]) as of 02/03/2016 20:29  Ref. Range 02/15/2014 17:25 02/16/2014 07:00 02/17/2014 05:34 02/18/2014 05:53 02/19/2014 05:42 02/20/2014 05:27 03/05/2014 20:53 03/06/2014 05:23  Hemoglobin Latest Ref Range: 12.0-15.0 g/dL 7.1 (L) 8.4 (L) 8.4 (L) 7.8 (L) 8.2 (L) 8.1 (L) 11.4 (L) 9.5 (L)   Results for MALARY, AYLESWORTH (MRN [id number]) as of 02/03/2016 20:29  Ref. Range 02/15/2014 21:02 12/25/2015 12:34  Ferritin Latest Ref Range: 20-288 ng/mL 4 (L) 26    RADIOGRAPHIC STUDIES: I have personally reviewed the radiological images as listed and agreed with the findings in the report. No results found.  ASSESSMENT & PLAN:  Iron deficiency anemia Microcytosis secondary to above Rectal varices found on colonoscopy 2015 Cirrhosis Esophageal varices found on EGD 2015 Hospitalized in 2015 secondary to symptomatic anemia requiring PRBC transfusion Capsule study with multiple polypoid like lesions throughout small bowel, scattered superficial non-bleeding erosions Intolerance to oral iron  We discussed that her iron deficiency may be secondary to GI related blood loss from her varices. In addition it is likely she may have a component of malabsorption. She has difficulty tolerating oral iron and therefore I have recommended IV replacement. She is agreeable.   The most likely cause of her anemia is due to chronic blood loss/malabsorption  syndrome. We discussed some of the risks, benefits, and alternatives of intravenous iron infusions. The patient is symptomatic from anemia and the iron level is critically low. She tolerated oral iron supplement poorly and desires to achieved higher levels of iron faster for adequate hematopoesis. Some of the side-effects to be expected including risks of infusion reactions, phlebitis, headaches, nausea and fatigue.  The patient is willing to proceed. Patient education material was dispensed.  Goal is to keep ferritin level greater than or equal to 100 ng/ml  I will see her back four to six weeks after her iron replacement for repeat labs. Additional recommendations will be made at that time. I do not feel a need for further anemia evaluation given her obvious iron deficiency and microcytic anemia however if her iron stores become replete and she remains anemic, additional evaluation will be pursued at that time.   Orders Placed This Encounter  Procedures  . CBC with Differential    Standing Status: Future     Number of Occurrences:      Standing Expiration Date: 01/30/2017  . Ferritin    Standing Status: Future     Number of Occurrences:      Standing Expiration Date: 01/30/2017    MEDICATIONS PRESCRIBED THIS ENCOUNTER: No orders of the defined types were placed in this encounter.   All questions were answered. The patient knows to call the clinic with any problems, questions or concerns.  This document serves as a record of services personally performed by Ancil Linsey, MD. It was created on her behalf by Arlyce Harman, a trained medical scribe. The creation of this record is based on the scribe's personal observations and the provider's statements to them. This document has been checked and approved by the attending provider.  I have reviewed the above documentation for accuracy and completeness, and I agree with the above.  This note was electronically signed.    Molli Hazard, MD  01/31/2016 2:34 PM

## 2016-02-04 ENCOUNTER — Encounter (HOSPITAL_BASED_OUTPATIENT_CLINIC_OR_DEPARTMENT_OTHER): Payer: BLUE CROSS/BLUE SHIELD

## 2016-02-04 ENCOUNTER — Encounter (HOSPITAL_COMMUNITY): Payer: Self-pay

## 2016-02-04 ENCOUNTER — Encounter (HOSPITAL_COMMUNITY): Payer: Self-pay | Admitting: Hematology & Oncology

## 2016-02-04 VITALS — BP 150/56 | HR 78 | Temp 98.7°F | Resp 18

## 2016-02-04 DIAGNOSIS — D500 Iron deficiency anemia secondary to blood loss (chronic): Secondary | ICD-10-CM | POA: Diagnosis not present

## 2016-02-04 MED ORDER — SODIUM CHLORIDE 0.9 % IV SOLN
Freq: Once | INTRAVENOUS | Status: AC
  Administered 2016-02-04: 15:00:00 via INTRAVENOUS

## 2016-02-04 MED ORDER — SODIUM CHLORIDE 0.9 % IV SOLN
510.0000 mg | Freq: Once | INTRAVENOUS | Status: AC
  Administered 2016-02-04: 510 mg via INTRAVENOUS
  Filled 2016-02-04: qty 17

## 2016-02-04 NOTE — Patient Instructions (Signed)
[place] at [facility] Discharge Instructions  RECOMMENDATIONS MADE BY THE CONSULTANT AND ANY TEST RESULTS WILL BE SENT TO YOUR REFERRING PHYSICIAN.  Iron infusion today. Return as scheduled next week for IV iron. Call the clinic should you have any questions or concerns.   Thank you for choosing [place] at [facility] to provide your oncology and hematology care.  To afford each patient quality time with our provider, please arrive at least 15 minutes before your scheduled appointment time.   Beginning January 23rd 2017 lab work for the [facility] will be done in the  Main lab at [facility] on 1st floor. If you have a lab appointment with the [place] please come in thru the  Main Entrance and check in at the main information desk  You need to re-schedule your appointment should you arrive 10 or more minutes late.  We strive to give you quality time with our providers, and arriving late affects you and other patients whose appointments are after yours.  Also, if you no show three or more times for appointments you may be dismissed from the clinic at the providers discretion.     Again, thank you for choosing [facility].  Our hope is that these requests will decrease the amount of time that you wait before being seen by our physicians.       _____________________________________________________________  Should you have questions after your visit to [facility], please contact our office at [phone] between the hours of 8:30 a.m. and 4:30 p.m.  Voicemails left after 4:30 p.m. will not be returned until the following business day.  For prescription refill requests, have your pharmacy contact our office.         Resources For Cancer Patients and their Caregivers ? American Cancer Society: Can assist with transportation, wigs, general needs, runs Look Good Feel Better.         (403)858-2331 ? Cancer Care: Provides financial assistance, online support groups, medication/co-pay assistance.  1-800-813-HOPE (705)534-6323) ? [facility] Assists [place] cancer patients and their families through emotional , educational and financial support.  (351) 122-0313 ? [place] DSS Where to apply for food stamps, Medicaid and utility assistance. (360)412-9766 ? RCATS: Transportation to medical appointments. 332-119-5089 ? Social Security Administration: May apply for disability if have a Stage IV cancer. 785-388-6023 531-312-7811 ? [employer], Disability and Transit Services: Assists with nutrition, care and transit needs. [place] Support Programs: @10RELATIVEDAYS @ > Cancer Support Group  2nd Tuesday of the month 1pm-2pm, Journey Room  > Creative Journey  3rd Tuesday of the month 1130am-1pm, Journey Room  > Look Good Feel Better  1st Wednesday of the month 10am-12 noon, Journey Room (Call [place] to register [phone])

## 2016-02-11 ENCOUNTER — Encounter (HOSPITAL_COMMUNITY): Payer: BLUE CROSS/BLUE SHIELD | Attending: Hematology & Oncology

## 2016-02-11 VITALS — BP 118/30 | HR 79 | Temp 98.1°F | Resp 18

## 2016-02-11 DIAGNOSIS — D500 Iron deficiency anemia secondary to blood loss (chronic): Secondary | ICD-10-CM | POA: Diagnosis not present

## 2016-02-11 MED ORDER — SODIUM CHLORIDE 0.9 % IV SOLN
510.0000 mg | Freq: Once | INTRAVENOUS | Status: AC
  Administered 2016-02-11: 510 mg via INTRAVENOUS
  Filled 2016-02-11: qty 17

## 2016-02-11 MED ORDER — SODIUM CHLORIDE 0.9 % IV SOLN
Freq: Once | INTRAVENOUS | Status: AC
  Administered 2016-02-11: 14:00:00 via INTRAVENOUS

## 2016-02-11 NOTE — Patient Instructions (Signed)
[place] at [facility] Discharge Instructions  RECOMMENDATIONS MADE BY THE CONSULTANT AND ANY TEST RESULTS WILL BE SENT TO YOUR REFERRING PHYSICIAN.  IV iron today.    Thank you for choosing [place] at [facility] to provide your oncology and hematology care.  To afford each patient quality time with our provider, please arrive at least 15 minutes before your scheduled appointment time.   Beginning January 23rd 2017 lab work for the [facility] will be done in the  Main lab at [facility] on 1st floor. If you have a lab appointment with the [place] please come in thru the  Main Entrance and check in at the main information desk  You need to re-schedule your appointment should you arrive 10 or more minutes late.  We strive to give you quality time with our providers, and arriving late affects you and other patients whose appointments are after yours.  Also, if you no show three or more times for appointments you may be dismissed from the clinic at the providers discretion.     Again, thank you for choosing [facility].  Our hope is that these requests will decrease the amount of time that you wait before being seen by our physicians.       _____________________________________________________________  Should you have questions after your visit to [facility], please contact our office at [phone] between the hours of 8:30 a.m. and 4:30 p.m.  Voicemails left after 4:30 p.m. will not be returned until the following business day.  For prescription refill requests, have your pharmacy contact our office.         Resources For Cancer Patients and their Caregivers ? American Cancer Society: Can assist with transportation, wigs, general needs, runs Look Good Feel Better.        629-106-6720 ? Cancer Care: Provides financial assistance, online support groups, medication/co-pay assistance.  1-800-813-HOPE  (754)062-6796) ? [facility] Assists [place] cancer patients and their families through emotional , educational and financial support.  402-091-4740 ? [place] DSS Where to apply for food stamps, Medicaid and utility assistance. (315)419-3887 ? RCATS: Transportation to medical appointments. 806-417-5119 ? Social Security Administration: May apply for disability if have a Stage IV cancer. 856-744-5665 5516303336 ? [employer], Disability and Transit Services: Assists with nutrition, care and transit needs. [place] Support Programs: @10RELATIVEDAYS @ > Cancer Support Group  2nd Tuesday of the month 1pm-2pm, Journey Room  > Creative Journey  3rd Tuesday of the month 1130am-1pm, Journey Room  > Look Good Feel Better  1st Wednesday of the month 10am-12 noon, Journey Room (Call [place] to register [phone])

## 2016-02-11 NOTE — Progress Notes (Signed)
Patient tolerated infusion well.  VSS.

## 2016-02-13 ENCOUNTER — Ambulatory Visit: Payer: BLUE CROSS/BLUE SHIELD | Attending: Family Medicine | Admitting: Neurology

## 2016-02-13 VITALS — Ht 62.0 in | Wt 225.0 lb

## 2016-02-13 DIAGNOSIS — G4719 Other hypersomnia: Secondary | ICD-10-CM | POA: Diagnosis not present

## 2016-02-13 DIAGNOSIS — Z7982 Long term (current) use of aspirin: Secondary | ICD-10-CM | POA: Insufficient documentation

## 2016-02-13 DIAGNOSIS — R5383 Other fatigue: Secondary | ICD-10-CM | POA: Diagnosis not present

## 2016-02-13 DIAGNOSIS — G4733 Obstructive sleep apnea (adult) (pediatric): Secondary | ICD-10-CM | POA: Diagnosis not present

## 2016-02-13 DIAGNOSIS — R0683 Snoring: Secondary | ICD-10-CM | POA: Insufficient documentation

## 2016-02-13 DIAGNOSIS — Z79899 Other long term (current) drug therapy: Secondary | ICD-10-CM | POA: Insufficient documentation

## 2016-02-13 DIAGNOSIS — G4710 Hypersomnia, unspecified: Secondary | ICD-10-CM

## 2016-02-18 ENCOUNTER — Other Ambulatory Visit: Payer: Self-pay | Admitting: "Endocrinology

## 2016-02-25 NOTE — Procedures (Signed)
[place] A. Merlene Laughter, MD     [email or web address]             NOCTURNAL POLYSOMNOGRAPHY   LOCATION: [facility]  Patient Name: Domonique, [place]othran Date: 02/13/2016 Gender: Female D.O.B: [birth year] Age (years): 46 Referring Provider: Not Available Height (inches): 62 Interpreting Physician: Phillips Odor MD, ABSM Weight (lbs): 225 RPSGT: Peak, Robert BMI: 41 MRN: [id number] Neck Size: 17.00 CLINICAL INFORMATION Sleep Study Type: NPSG Indication for sleep study: Fatigue, Snoring Epworth Sleepiness Score: 5 SLEEP STUDY TECHNIQUE As per the AASM Manual for the Scoring of Sleep and Associated Events v2.3 (April 2016) with a hypopnea requiring 4% desaturations. The channels recorded and monitored were frontal, central and occipital EEG, electrooculogram (EOG), submentalis EMG (chin), nasal and oral airflow, thoracic and abdominal wall motion, anterior tibialis EMG, snore microphone, electrocardiogram, and pulse oximetry. MEDICATIONS Patient's medications include: N/A. Medications self-administered by patient during sleep study : No sleep medicine administered.  Current outpatient prescriptions:  .  amitriptyline (ELAVIL) 25 MG tablet, Take 25 mg by mouth at bedtime., Disp: , Rfl:  .  aspirin 81 MG tablet, Take 81 mg by mouth daily., Disp: , Rfl:  .  baclofen (LIORESAL) 10 MG tablet, Take 10 mg by mouth 3 (three) times daily., Disp: , Rfl:  .  benazepril (LOTENSIN) 20 MG tablet, Take 20 mg by mouth daily., Disp: , Rfl:  .  cefdinir (OMNICEF) 300 MG capsule, Take 300 mg by mouth 2 (two) times daily., Disp: , Rfl:  .  CRANBERRY-VITAMIN C PO, Take 2 capsules by mouth at bedtime. Dosage is the 25,200 mg cranberry/vitamin C supplement, Disp: , Rfl:  .  diphenhydrAMINE (SOMINEX) 25 MG tablet, Take by mouth at bedtime. Reported on 01/31/2016, Disp: , Rfl:  .  diphenoxylate-atropine (LOMOTIL) 2.5-0.025 MG per tablet, Take by mouth 4 (four) times daily as needed for  diarrhea or loose stools. One daily, Disp: , Rfl:  .  ezetimibe (ZETIA) 10 MG tablet, Take 10 mg by mouth daily., Disp: , Rfl:  .  ferrous sulfate 325 (65 FE) MG tablet, Take 325 mg by mouth 2 (two) times daily., Disp: , Rfl:  .  furosemide (LASIX) 80 MG tablet, Take 80 mg by mouth daily., Disp: , Rfl:  .  gabapentin (NEURONTIN) 300 MG capsule, Take 300 mg by mouth 3 (three) times daily., Disp: , Rfl:  .  HUMULIN R U-500 KWIKPEN 500 UNIT/ML kwikpen, INJECT 130 UNITS INTO THE SKIN 3 TIMES DAILY WITH MEALS, Disp: 18 mL, Rfl: 2 .  Insulin Pen Needle (QC PEN NEEDLES) 31G X 8 MM MISC, Use as directed 3 x daily, Disp: 100 each, Rfl: 2 .  lansoprazole (PREVACID) 30 MG capsule, Take 30 mg by mouth daily., Disp: , Rfl:  .  levothyroxine (SYNTHROID, LEVOTHROID) 88 MCG tablet, TAKE 1 TABLET ONCE A DAY BEFORE BREAKFAST, Disp: 30 tablet, Rfl: 3 .  Multiple Vitamins-Minerals (CENTRUM SILVER PO), Take 1 tablet by mouth daily. , Disp: , Rfl:  .  potassium chloride (K-DUR) 10 MEQ tablet, Take 10 mEq by mouth daily., Disp: , Rfl:  .  Probiotic Product (ALIGN) 4 MG CAPS, Take 1 capsule by mouth daily. Reported on 01/31/2016, Disp: , Rfl:  .  propranolol (INDERAL) 10 MG tablet, Take 2 tablets (20 mg total) by mouth daily., Disp: 60 tablet, Rfl: 11 .  VICTOZA 18 MG/3ML SOPN, INJECT 1.8MG SUBCUTANEOUSLY ONCE DAILY, Disp: 9 mL, Rfl: 2  SLEEP ARCHITECTURE The study was initiated at 10:00:38 PM and ended  at 4:04:26 AM. Sleep onset time was 74.3 minutes and the sleep efficiency was 50.3%. The total sleep time was 183.0 minutes. Stage REM latency was 109.5 minutes. The patient spent 4.10% of the night in stage N1 sleep, 60.66% in stage N2 sleep, 22.95% in stage N3 and 12.29% in REM. Alpha intrusion was absent. Supine sleep was 100.00%. RESPIRATORY PARAMETERS The overall apnea/hypopnea index (AHI) was 32.8 per hour. There were 2 total apneas, including 1 obstructive, 1 central and 0 mixed apneas. There were 98 hypopneas  and 3 RERAs. The AHI during Stage REM sleep was 101.3 per hour. AHI while supine was 32.8 per hour. The mean oxygen saturation was 88.79%. The minimum SpO2 during sleep was 64.00%. Loud snoring was noted during this study. CARDIAC DATA The 2 lead EKG demonstrated sinus rhythm. The mean heart rate was 86.80 beats per minute. Other EKG findings include: None. LEG MOVEMENT DATA The total PLMS were 0 with a resulting PLMS index of 0.00. Associated arousal with leg movement index was 0.0.    IMPRESSIONS - Moderate obstructive sleep apnea worse during REM sleep. Suggest AutoPAP 8 - 15.  - Significantly reduced sleep efficiency.    Delano Metz, MD Diplomate, American Board of Sleep Medicine.

## 2016-03-02 ENCOUNTER — Other Ambulatory Visit (HOSPITAL_COMMUNITY): Payer: BLUE CROSS/BLUE SHIELD

## 2016-03-02 ENCOUNTER — Encounter (HOSPITAL_COMMUNITY): Payer: BLUE CROSS/BLUE SHIELD

## 2016-03-02 DIAGNOSIS — D500 Iron deficiency anemia secondary to blood loss (chronic): Secondary | ICD-10-CM | POA: Diagnosis present

## 2016-03-02 LAB — CBC WITH DIFFERENTIAL/PLATELET
Basophils Absolute: 0 10*3/uL (ref 0.0–0.1)
Basophils Relative: 0 %
EOS ABS: 0.1 10*3/uL (ref 0.0–0.7)
EOS PCT: 2 %
HCT: 35.3 % — ABNORMAL LOW (ref 36.0–46.0)
Hemoglobin: 10.9 g/dL — ABNORMAL LOW (ref 12.0–15.0)
LYMPHS ABS: 1.1 10*3/uL (ref 0.7–4.0)
Lymphocytes Relative: 20 %
MCH: 27.7 pg (ref 26.0–34.0)
MCHC: 30.9 g/dL (ref 30.0–36.0)
MCV: 89.6 fL (ref 78.0–100.0)
MONOS PCT: 6 %
Monocytes Absolute: 0.3 10*3/uL (ref 0.1–1.0)
Neutro Abs: 3.7 10*3/uL (ref 1.7–7.7)
Neutrophils Relative %: 71 %
RBC: 3.94 MIL/uL (ref 3.87–5.11)
RDW: 23.5 % — ABNORMAL HIGH (ref 11.5–15.5)
WBC: 5.2 10*3/uL (ref 4.0–10.5)

## 2016-03-02 LAB — FERRITIN: FERRITIN: 155 ng/mL (ref 11–307)

## 2016-03-03 ENCOUNTER — Ambulatory Visit (HOSPITAL_COMMUNITY): Payer: BLUE CROSS/BLUE SHIELD | Admitting: Hematology & Oncology

## 2016-03-18 ENCOUNTER — Other Ambulatory Visit: Payer: Self-pay | Admitting: "Endocrinology

## 2016-03-21 NOTE — Progress Notes (Signed)
This encounter was created in error - please disregard.

## 2016-03-23 LAB — HEMOGLOBIN A1C: HEMOGLOBIN A1C: 6.9

## 2016-03-31 ENCOUNTER — Ambulatory Visit (INDEPENDENT_AMBULATORY_CARE_PROVIDER_SITE_OTHER): Payer: BLUE CROSS/BLUE SHIELD | Admitting: "Endocrinology

## 2016-03-31 ENCOUNTER — Encounter: Payer: Self-pay | Admitting: "Endocrinology

## 2016-03-31 VITALS — BP 116/72 | HR 80 | Resp 18 | Ht 61.5 in | Wt 225.0 lb

## 2016-03-31 DIAGNOSIS — N183 Chronic kidney disease, stage 3 unspecified: Secondary | ICD-10-CM

## 2016-03-31 DIAGNOSIS — E038 Other specified hypothyroidism: Secondary | ICD-10-CM | POA: Diagnosis not present

## 2016-03-31 DIAGNOSIS — E1122 Type 2 diabetes mellitus with diabetic chronic kidney disease: Secondary | ICD-10-CM

## 2016-03-31 DIAGNOSIS — E6609 Other obesity due to excess calories: Secondary | ICD-10-CM

## 2016-03-31 DIAGNOSIS — I10 Essential (primary) hypertension: Secondary | ICD-10-CM | POA: Diagnosis not present

## 2016-03-31 MED ORDER — INSULIN REGULAR HUMAN (CONC) 500 UNIT/ML ~~LOC~~ SOPN: 130.0000 [IU] | PEN_INJECTOR | Freq: Three times a day (TID) | SUBCUTANEOUS | 3 refills | Status: DC

## 2016-03-31 MED ORDER — GABAPENTIN 100 MG PO CAPS: 100.0000 mg | ORAL_CAPSULE | Freq: Three times a day (TID) | ORAL | 3 refills | Status: DC

## 2016-03-31 NOTE — Patient Instructions (Signed)
Advice for weight management -For most of us the best way to lose weight is by diet management. Generally speaking, diet management means restricting carbohydrate consumption to minimum possible (and to unprocessed or minimally processed complex starch) and increasing protein intake (animal or plant source), fruits, and vegetables.  -Sticking to a routine mealtime to eat 3 meals a day and avoiding unnecessary snacks is shown to have a big role in weight control.  -It is better to avoid simple carbohydrates including: Cakes, Desserts, Ice Cream, Soda (diet and regular), Sweet Tea, Candies, Chips, Cookies, Artificial Sweeteners, and "Sugar-free" Products.   -Exercise: 30 minutes a day 3-4 days a week, or 150 minutes a week. Combine stretch, strength, and aerobic activities. You may seek evaluation by your heart doctor prior to initiating exercise if you have high risk for heart disease.  -If you are interested, we can schedule a visit with Penny Martinez, RDN, CDE for individualized nutrition education.  

## 2016-03-31 NOTE — Progress Notes (Signed)
Subjective:    Patient ID: Penny Martinez, female    DOB: May 03, 1951,    Past Medical History:  Diagnosis Date  . Acid reflux   . C. difficile colitis June/July 2015  . CHF (congestive heart failure) ([place])   . 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 Negative viral markers 2015  . COPD (chronic obstructive pulmonary disease) ([place])   . Diabetes mellitus without complication ([place])   . Hypercholesteremia   . Hypertension   . IBS (irritable bowel syndrome)    Past Surgical History:  Procedure Laterality Date  . ABDOMINAL HYSTERECTOMY    . APPENDECTOMY    . 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 CHOLECYSTECTOMY    . COLONOSCOPY N/A 02/19/2014   Dr. Gala Romney: rectal varices, rectal polyp overlying a varix non-manipulated  . ESOPHAGOGASTRODUODENOSCOPY N/A 02/19/2014   Dr. Gala Romney: esophageal varices, multiple gastric polyps with largest biopsied and hyperplastic. Negative H.pylori  . GIVENS CAPSULE STUDY N/A 05/04/2014   multiple polypoid-like lesions throughout small bowel, scattered superficial non-bleeding erosions more distal bowel, referred to [facility] for enteroscopy. Capsule study not complete, poor images.   . urosepsis     Social History   Social History  . Marital status: Married    Spouse name: N/A  . Number of children: N/A  . Years of education: N/A   Social History Main Topics  . Smoking status: Former Smoker    Packs/day: 2.00    Years: 35.00    Quit date: 05/05/2003  . Smokeless tobacco: Former Systems developer     Comment: Quit smoking 11 years ago  . Alcohol use No  . Drug use: No  . Sexual activity: Not Currently    Birth control/ protection: Surgical   Other Topics Concern  . None   Social History Narrative  . None   Outpatient Encounter Prescriptions as of 03/31/2016  Medication Sig  . amitriptyline (ELAVIL) 25 MG tablet Take 25 mg by mouth at bedtime.  Marland Kitchen aspirin 81 MG tablet Take 81 mg by mouth daily.  . baclofen (LIORESAL) 10 MG tablet Take 10  mg by mouth 3 (three) times daily.  . benazepril (LOTENSIN) 20 MG tablet Take 20 mg by mouth daily.  . cefdinir (OMNICEF) 300 MG capsule Take 300 mg by mouth 2 (two) times daily.  Marland Kitchen CRANBERRY-VITAMIN C PO Take 2 capsules by mouth at bedtime. Dosage is the 25,200 mg cranberry/vitamin C supplement  . diphenhydrAMINE (SOMINEX) 25 MG tablet Take by mouth at bedtime. Reported on 01/31/2016  . diphenoxylate-atropine (LOMOTIL) 2.5-0.025 MG per tablet Take by mouth 4 (four) times daily as needed for diarrhea or loose stools. One daily  . ezetimibe (ZETIA) 10 MG tablet Take 10 mg by mouth daily.  . ferrous sulfate 325 (65 FE) MG tablet Take 325 mg by mouth 2 (two) times daily.  . furosemide (LASIX) 80 MG tablet Take 80 mg by mouth daily.  Marland Kitchen gabapentin (NEURONTIN) 100 MG capsule Take 1 capsule (100 mg total) by mouth 3 (three) times daily.  . Insulin Pen Needle (QC PEN NEEDLES) 31G X 8 MM MISC Use as directed 3 x daily  . insulin regular human CONCENTRATED (HUMULIN R U-500 KWIKPEN) 500 UNIT/ML kwikpen Inject 130 Units into the skin 3 (three) times daily with meals.  . lansoprazole (PREVACID) 30 MG capsule Take 30 mg by mouth daily.  Marland Kitchen levothyroxine (SYNTHROID, LEVOTHROID) 88 MCG tablet TAKE 1 TABLET ONCE A DAY BEFORE BREAKFAST  . Multiple  Vitamins-Minerals (CENTRUM SILVER PO) Take 1 tablet by mouth daily.   . potassium chloride (K-DUR) 10 MEQ tablet Take 10 mEq by mouth daily.  . Probiotic Product (ALIGN) 4 MG CAPS Take 1 capsule by mouth daily. Reported on 01/31/2016  . propranolol (INDERAL) 10 MG tablet Take 2 tablets (20 mg total) by mouth daily.  Marland Kitchen VICTOZA 18 MG/3ML SOPN INJECT 1.8MG SUBCUTANEOUSLY ONCE DAILY  . [DISCONTINUED] gabapentin (NEURONTIN) 300 MG capsule Take 100 mg by mouth 3 (three) times daily.  . [DISCONTINUED] HUMULIN R U-500 KWIKPEN 500 UNIT/ML kwikpen INJECT 130 UNITS INTO THE SKIN 3 TIMES DAILY WITH MEALS  . [DISCONTINUED] HUMULIN R U-500 KWIKPEN 500 UNIT/ML kwikpen INJECT 130 UNITS  INTO THE SKIN 3 TIMES DAILY WITH MEALS   No facility-administered encounter medications on file as of 03/31/2016.    ALLERGIES: Allergies  Allergen Reactions  . Niacin Rash and Shortness Of Breath  . Penicillins Shortness Of Breath  . Erythromycin Hives  . Statins     Other reaction(s): Muscle Pain  . Sulfa Antibiotics    VACCINATION STATUS:  There is no immunization history on file for this patient.  Diabetes  She presents for her follow-up diabetic visit. She has type 2 diabetes mellitus. Onset time: She was diagnosed at approximate age of 59 years. Her disease course has been improving. There are no hypoglycemic associated symptoms. Pertinent negatives for hypoglycemia include no confusion, headaches, pallor or seizures. Associated symptoms include fatigue, polydipsia and polyuria. Pertinent negatives for diabetes include no chest pain and no polyphagia. There are no hypoglycemic complications. Symptoms are improving. Diabetic complications include heart disease, nephropathy and PVD. Risk factors for coronary artery disease include diabetes mellitus, dyslipidemia, obesity, hypertension, sedentary lifestyle and tobacco exposure. Current diabetic treatment includes intensive insulin program. She is compliant with treatment most of the time. Her weight is increasing steadily. She is following a generally unhealthy diet. Prior visit with dietitian: She declined referral to a dietitian. She never participates in exercise. Her breakfast blood glucose range is generally 140-180 mg/dl. Her lunch blood glucose range is generally 140-180 mg/dl. Her dinner blood glucose range is generally 180-200 mg/dl. Her overall blood glucose range is 140-180 mg/dl. An ACE inhibitor/angiotensin II receptor blocker is being taken. Eye exam is current.  Hypertension  This is a chronic problem. The current episode started more than 1 year ago. The problem is controlled. Pertinent negatives include no chest pain,  headaches, palpitations or shortness of breath. Risk factors for coronary artery disease include diabetes mellitus, dyslipidemia, obesity and sedentary lifestyle. Hypertensive end-organ damage includes PVD.  Hyperlipidemia  This is a chronic problem. The current episode started more than 1 year ago. Pertinent negatives include no chest pain, myalgias or shortness of breath. Risk factors for coronary artery disease include diabetes mellitus, dyslipidemia, hypertension, a sedentary lifestyle and obesity.     Review of Systems  Constitutional: Positive for fatigue. Negative for unexpected weight change.  HENT: Negative for trouble swallowing and voice change.   Eyes: Negative for visual disturbance.  Respiratory: Negative for cough, shortness of breath and wheezing.   Cardiovascular: Negative for chest pain, palpitations and leg swelling.  Gastrointestinal: Negative for diarrhea, nausea and vomiting.  Endocrine: Positive for polydipsia and polyuria. Negative for cold intolerance, heat intolerance and polyphagia.  Musculoskeletal: Negative for arthralgias and myalgias.  Skin: Negative for color change, pallor, rash and wound.  Neurological: Negative for seizures and headaches.  Psychiatric/Behavioral: Negative for confusion and suicidal ideas.    Objective:  BP 116/72 (BP Location: Right Arm, Patient Position: Sitting, Cuff Size: Large)   Pulse 80   Resp 18   Ht 5' 1.5" (1.562 m)   Wt 225 lb (102.1 kg)   SpO2 93%   BMI 41.82 kg/m   Wt Readings from Last 3 Encounters:  03/31/16 225 lb (102.1 kg)  02/13/16 225 lb (102.1 kg)  01/31/16 226 lb 9.6 oz (102.8 kg)    Physical Exam  Constitutional: She is oriented to person, place, and time. She appears well-developed.  HENT:  Head: Normocephalic and atraumatic.  Eyes: EOM are normal.  Neck: Normal range of motion. Neck supple. No tracheal deviation present. No thyromegaly present.  Cardiovascular: Normal rate and regular rhythm.    Pulmonary/Chest: Effort normal and breath sounds normal.  Abdominal: Soft. Bowel sounds are normal. There is no tenderness. There is no guarding.  Musculoskeletal: Normal range of motion. She exhibits no edema.  Neurological: She is alert and oriented to person, place, and time. She has normal reflexes. No cranial nerve deficit. Coordination normal.  Skin: Skin is warm and dry. No rash noted. No erythema. No pallor.  Psychiatric: She has a normal mood and affect. Judgment normal.    Results for orders placed or performed in visit on 03/31/16  Hemoglobin A1c  Result Value Ref Range   Hemoglobin A1C 6.9    Complete Blood Count (Most recent): Lab Results  Component Value Date   WBC 5.2 03/02/2016   HGB 10.9 (L) 03/02/2016   HCT 35.3 (L) 03/02/2016   MCV 89.6 03/02/2016   PLT SPECIMEN CHECKED FOR CLOTS 03/02/2016    Diabetic Labs (most recent): Lab Results  Component Value Date   HGBA1C 6.9 03/23/2016   HGBA1C 7.6 (H) 12/18/2015   HGBA1C 7.6 12/18/2015     Assessment & Plan:   1. Uncontrolled type 2 diabetes mellitus with complication, with long-term current use of insulin ([place])  Her diabetes is  complicated by chronic kidney disease and peripheral arterial disease as well as CHF.  patient remains at a high risk for more acute and chronic complications of diabetes which include CAD, CVA, CKD, retinopathy, and neuropathy. These are all discussed in detail with the patient.  Patient came with  Improved glucose profile and A1c of  6.9% improving from 7.6% on  U500 insulin.   Glucose logs and insulin administration records pertaining to this visit,  to be scanned into patient's records.  Recent labs reviewed.   - I have re-counseled the patient on diet management and weight loss  by adopting a carbohydrate restricted / protein rich  Diet.  - Suggestion is made for patient to avoid simple carbohydrates   from their diet including Cakes , Desserts, Ice Cream,  Soda (  diet and  regular) , Sweet Tea , Candies,  Chips, Cookies, Artificial Sweeteners,   and "Sugar-free" Products .  This will help patient to have stable blood glucose profile and potentially avoid unintended  Weight gain.  - Patient is advised to stick to a routine mealtimes to eat 3 meals  a day and avoid unnecessary snacks ( to snack only to correct hypoglycemia).   - I have approached patient with the following individualized plan to manage diabetes and patient agrees.  - She will continue to need U500 insulin. -I will  increase humulin  U500 to 120 units with breakfast, 120 units with lunch, and 100 units with supper for pre-meal glucose profile above 90 mg/dL associated with monitoring of blood glucose  before meals and at bedtime. -She will continue Victoza 1.8 mg.  -Patient is encouraged to call clinic for blood glucose levels less than 70 or above 300 mg /dl.  -Advised to avoid unnecessary evening snacks.   -she is not interested  to explore option of bariatric surgery. -She declined referral to the dietitian.  -Patient is not a candidate for metformin andSGLT2 inhibitors due to CKD.   - Patient specific target  for A1c; LDL, HDL, Triglycerides, and  Waist Circumference were discussed in detail.  2) BP/HTN:  controlled. Continue current medications including ACEI/ARB. 3) Lipids/HPL:  continue statins. 4)  Weight/Diet:  she decided not to consult with a dietitian, exercise, and carbohydrates information provided.  4) Primary hypothyroidism She is euthyroid. Heart thyroid function tests are consistent with appropriate replacement. I advised her to increase levothyroxine to 88g by mouth every morning.  - We discussed about correct intake of levothyroxine, at fasting, with water, separated by at least 30 minutes from breakfast, and separated by more than 4 hours from calcium, iron, multivitamins, acid reflux medications (PPIs). -Patient is made aware of the fact that thyroid hormone  replacement is needed for life, dose to be adjusted by periodic monitoring of thyroid function tests.  6) Chronic Care/Health Maintenance:  -Patient is  on ACEI/ARB and Statin medications and encouraged to continue to follow up with Ophthalmology, Podiatrist at least yearly or according to recommendations, and advised to  stay away from smoking. I have recommended yearly flu vaccine and pneumonia vaccination at least every 5 years; moderate intensity exercise for up to 150 minutes weekly; and  sleep for at least 7 hours a day.  I advised patient to maintain close follow up with her PCP for primary care needs.  Patient is asked to bring meter and  blood glucose logs during their next visit.   Follow up plan: Return in about 3 months (around 07/01/2016) for follow up with pre-visit labs, meter, and logs.  Glade Lloyd, MD Phone: [phone]  Fax: [phone]   03/31/2016, 11:45 AM

## 2016-04-13 ENCOUNTER — Encounter: Payer: Self-pay | Admitting: "Endocrinology

## 2016-04-16 ENCOUNTER — Encounter (HOSPITAL_COMMUNITY): Payer: BLUE CROSS/BLUE SHIELD

## 2016-04-16 ENCOUNTER — Other Ambulatory Visit: Payer: Self-pay | Admitting: "Endocrinology

## 2016-04-16 ENCOUNTER — Encounter (HOSPITAL_COMMUNITY): Payer: Self-pay | Admitting: Adult Health

## 2016-04-16 ENCOUNTER — Encounter (HOSPITAL_COMMUNITY): Payer: BLUE CROSS/BLUE SHIELD | Attending: Adult Health | Admitting: Adult Health

## 2016-04-16 VITALS — BP 138/46 | HR 78 | Temp 98.4°F | Resp 18 | Wt 227.7 lb

## 2016-04-16 DIAGNOSIS — D509 Iron deficiency anemia, unspecified: Secondary | ICD-10-CM

## 2016-04-16 DIAGNOSIS — D649 Anemia, unspecified: Secondary | ICD-10-CM

## 2016-04-16 DIAGNOSIS — D500 Iron deficiency anemia secondary to blood loss (chronic): Secondary | ICD-10-CM | POA: Diagnosis present

## 2016-04-16 LAB — COMPREHENSIVE METABOLIC PANEL
ALBUMIN: 3.7 g/dL (ref 3.5–5.0)
ALT: 26 U/L (ref 14–54)
ANION GAP: 4 — AB (ref 5–15)
AST: 39 U/L (ref 15–41)
Alkaline Phosphatase: 83 U/L (ref 38–126)
BILIRUBIN TOTAL: 0.4 mg/dL (ref 0.3–1.2)
BUN: 28 mg/dL — ABNORMAL HIGH (ref 6–20)
CO2: 32 mmol/L (ref 22–32)
Calcium: 8.5 mg/dL — ABNORMAL LOW (ref 8.9–10.3)
Chloride: 102 mmol/L (ref 101–111)
Creatinine, Ser: 1.02 mg/dL — ABNORMAL HIGH (ref 0.44–1.00)
GFR calc Af Amer: >60 mL/min (ref >60)
GFR, EST NON AFRICAN AMERICAN: 57 mL/min — AB (ref >60)
Glucose, Bld: 166 mg/dL — ABNORMAL HIGH (ref 65–99)
POTASSIUM: 4.1 mmol/L (ref 3.5–5.1)
Sodium: 138 mmol/L (ref 135–145)
TOTAL PROTEIN: 7.3 g/dL (ref 6.5–8.1)

## 2016-04-16 LAB — IRON AND TIBC
IRON: 56 ug/dL (ref 28–170)
SATURATION RATIOS: 16 % (ref 10.4–31.8)
TIBC: 343 ug/dL (ref 250–450)
UIBC: 287 ug/dL

## 2016-04-16 LAB — CBC WITH DIFFERENTIAL/PLATELET
BASOS PCT: 0 %
Basophils Absolute: 0 10*3/uL (ref 0.0–0.1)
EOS PCT: 1 %
Eosinophils Absolute: 0.1 10*3/uL (ref 0.0–0.7)
HEMATOCRIT: 36.2 % (ref 36.0–46.0)
Hemoglobin: 11.7 g/dL — ABNORMAL LOW (ref 12.0–15.0)
Lymphocytes Relative: 24 %
Lymphs Abs: 1.3 10*3/uL (ref 0.7–4.0)
MCH: 29.3 pg (ref 26.0–34.0)
MCHC: 32.3 g/dL (ref 30.0–36.0)
MCV: 90.5 fL (ref 78.0–100.0)
MONO ABS: 0.4 10*3/uL (ref 0.1–1.0)
MONOS PCT: 8 %
NEUTROS ABS: 3.7 10*3/uL (ref 1.7–7.7)
Neutrophils Relative %: 67 %
Platelets: 87 10*3/uL — ABNORMAL LOW (ref 150–400)
RBC: 4 MIL/uL (ref 3.87–5.11)
RDW: 17.4 % — AB (ref 11.5–15.5)
WBC: 5.5 10*3/uL (ref 4.0–10.5)

## 2016-04-16 LAB — FERRITIN: Ferritin: 35 ng/mL (ref 11–307)

## 2016-04-16 NOTE — Patient Instructions (Signed)
[place] at [facility] Discharge Instructions  RECOMMENDATIONS MADE BY THE CONSULTANT AND ANY TEST RESULTS WILL BE SENT TO YOUR REFERRING PHYSICIAN.  You saw Alisa Graff today. Labs today-we will call you with results. Possible Iron infusion next week. Follow up and labs in 1 month.  Thank you for choosing [place] at [facility] to provide your oncology and hematology care.  To afford each patient quality time with our provider, please arrive at least 15 minutes before your scheduled appointment time.   Beginning January 23rd 2017 lab work for the [facility] will be done in the  Main lab at [facility] on 1st floor. If you have a lab appointment with the [facility] please come in thru the  Main Entrance and check in at the main information desk  You need to re-schedule your appointment should you arrive 10 or more minutes late.  We strive to give you quality time with our providers, and arriving late affects you and other patients whose appointments are after yours.  Also, if you no show three or more times for appointments you may be dismissed from the clinic at the providers discretion.     Again, thank you for choosing [facility].  Our hope is that these requests will decrease the amount of time that you wait before being seen by our physicians.       _____________________________________________________________  Should you have questions after your visit to [facility], please contact our office at [phone] between the hours of 8:30 a.m. and 4:30 p.m.  Voicemails left after 4:30 p.m. will not be returned until the following business day.  For prescription refill requests, have your pharmacy contact our office.         Resources For Cancer Patients and their Caregivers ? American Cancer Society: Can assist with transportation, wigs, general needs, runs Look Good Feel Better.         714-323-0566 ? Cancer Care: Provides financial assistance, online support groups, medication/co-pay assistance.  1-800-813-HOPE 620-199-7697) ? [facility] Assists [place] cancer patients and their families through emotional , educational and financial support.  (254)306-0791 ? [place] DSS Where to apply for food stamps, Medicaid and utility assistance. (219) 111-7006 ? [facility]: Transportation to medical appointments. 667-599-8612 ? Social Security Administration: May apply for disability if have a Stage IV cancer. 574 850 7159 9841411498 ? [employer], Disability and Transit Services: Assists with nutrition, care and transit needs. [place] Support Programs: @10RELATIVEDAYS @ > Cancer Support Group  2nd Tuesday of the month 1pm-2pm, Journey Room  > Creative Journey  3rd Tuesday of the month 1130am-1pm, Journey Room  > Look Good Feel Better  1st Wednesday of the month 10am-12 noon, Journey Room (Call [place] to register [phone])

## 2016-04-16 NOTE — Progress Notes (Signed)
[facility]  PROGRESS NOTE  Patient Care Team: Antionette Fairy, PA-C as PCP - General (Physician Assistant) Daneil Dolin, MD as Consulting Physician (Gastroenterology)  CHIEF COMPLAINTS/PURPOSE OF CONSULTATION:  Iron deficiency anemia secondary to chronic GI related blood loss Rectal varices found on colonoscopy 2015 Cirrhosis secondary to NASH Esophageal varices found on EGD 2015 Hospitalized in 2015 secondary to symptomatic anemia requiring PRBC transfusion Capsule study with multiple polypoid like lesions throughout small bowel, scattered superficial non-bleeding erosions   HISTORY OF PRESENTING ILLNESS:  (from recent visit 03/03/16) Penny Martinez 65 y.o. female is here because of referral by [facility] for anemia and iron deficiency.  Penny Martinez is unaccompanied. I personally reviewed and went over laboratory studies with the patient.  She is taking iron pills. She has forgotten to take her iron tablets with other new medications before. She notes that at times her oral iron irritates her stomach and in addition there are a lot of "interactions" with other medications she is on. She at times finds it difficult to take it on a regular ongoing basis.   She had a pill cam with Dr. Gala Romney about two years ago. She notes the results were abnormal and she was referred to [facility]. She went to [facility] last year to have the same procedure done, however her insurance would not cover it. She notes that the pill cam alone was 18K.   She denies blood in stool. She does admit she has IBS so she occasionally notices bright red blood on tissue after wiping frequently. She craves ice all the time and has been for a long time. She also craved ice water when she was pregnant with her second child.   Reports bilateral leg swelling. Notes this swelling has become chronic and her feet will become numb. She denies ever having a heart attack. Also notes  swelling in her thighs and hips, "I can't wear any of my clothes".  She is currently seeing a new cardiologist and reports undergoing a cardiac evaluation.   She is up to date on screening mammograms. She is scheduled for a sleep study here on June 8th.   The patient is here for further evaluation and management of iron deficiency anemia.   INTERVAL HISTORY (04/16/16):  Penny Martinez returns to the [facility] today unaccompanied.  At the start of the appointment, she notes that she's feeling a little bit better than she did and has a little bit more energy than she did, "just a tad."  She confirms that she tolerated both of her iron infusions well. She denies any bleeding in her stool that she's noticed, or any other bleeding or bruising she's noticed. She says she does have headaches, but nothing new that she's noticed.  She does not/has not been taking oral iron supplementation, as it hurts her stomach.   She says that her bowels are working okay, adding that "they go overtime." In terms of urination, she notes that sometimes it's not that easy, and that she has frequent UTI's, taking an antibiotic every night before she goes to bed. She does follow up with a urologist for this. She has not noticed any blood in her urine that she's been able to see.   In terms of energy, she says she still gets really tired, but can walk much better across the floor. She notes that walking across a distance like the parking lot is harder on her, where she gets really tired.  She adds that this makes her legs tired, and causes her legs and feet and everything to "really hurt," commenting "I guess it's the neuropathy." Dr. Dorris Fetch is managing her neuropathy, and her next appointment with him is in 3 months. She confirms taking the gabapentin 100 mg 3 times a day, adding that Dr. Dorris Fetch doesn't want her to go up on it.   She confirms that she has diabetes, which she indicates is the cause of her neuropathy. She was  reminded that, presumably, if we get her diabetes under control, her neuropathy should resolve, but sometimes that isn't the case if the nerve damage is extensive enough. We discussed the nature of peripheral neuropathy and that going up on the dose of gabapentin could help her, unless her neuropathy is different from the kind induced by cancer. Penny Martinez notes that "something's gotta give" in terms of her neuropathy.  When asked about ice cravings, she says "it's better." She says she's still eating it, but that it's "not as bad."  During the physical exam, she confirms that her appetite has been good. She denies any belly pain when her abdomen is palpated, noting "not right now." When she does have belly pain, she says "it feels like my liver's gonna flip." She notes that these pains do resolve and go away on their own.   MEDICAL HISTORY:  Past Medical History:  Diagnosis Date  . Acid reflux   . C. difficile colitis June/July 2015  . CHF (congestive heart failure) ([place])   . Cirrhosis ([place])    likely due to NASH. Negative viral markers 2015  . COPD (chronic obstructive pulmonary disease) ([place])   . Diabetes mellitus without complication ([place])   . Hypercholesteremia   . Hypertension   . IBS (irritable bowel syndrome)     SURGICAL HISTORY: Past Surgical History:  Procedure Laterality Date  . ABDOMINAL HYSTERECTOMY    . APPENDECTOMY    . 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 CHOLECYSTECTOMY    . COLONOSCOPY N/A 02/19/2014   Dr. Gala Romney: rectal varices, rectal polyp overlying a varix non-manipulated  . ESOPHAGOGASTRODUODENOSCOPY N/A 02/19/2014   Dr. Gala Romney: esophageal varices, multiple gastric polyps with largest biopsied and hyperplastic. Negative H.pylori  . GIVENS CAPSULE STUDY N/A 05/04/2014   multiple polypoid-like lesions throughout small bowel, scattered superficial non-bleeding erosions more distal bowel, referred to [facility] for enteroscopy. Capsule study not  complete, poor images.   . urosepsis      SOCIAL HISTORY: Social History   Social History  . Marital status: Married    Spouse name: N/A  . Number of children: N/A  . Years of education: N/A   Occupational History  . Not on file.   Social History Main Topics  . Smoking status: Former Smoker    Packs/day: 2.00    Years: 35.00    Quit date: 05/05/2003  . Smokeless tobacco: Former Systems developer     Comment: Quit smoking 11 years ago  . Alcohol use No  . Drug use: No  . Sexual activity: Not Currently    Birth control/ protection: Surgical   Other Topics Concern  . Not on file   Social History Narrative  . 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 ETOH, none She used to crossstitch but no longer can with her hands.  FAMILY HISTORY: Family History  Problem Relation Age of Onset  . Diabetes Mother   . Hypertension Mother   . Colon cancer Neg Hx    Mother was diabetic and ended up on dialysis, she died at 34 years-old Father died of an aneurysm at 58 years-old 90 brother and 1 sister Brother is borderline diabetic with heart issues Sister is fairly healthy  ALLERGIES:  is allergic to niacin; penicillins; erythromycin; statins; and sulfa antibiotics.  MEDICATIONS:  Current Outpatient Prescriptions  Medication Sig Dispense Refill  . amitriptyline (ELAVIL) 25 MG tablet Take 25 mg by mouth at bedtime.    Marland Kitchen aspirin 81 MG tablet Take 81 mg by mouth daily.    . baclofen (LIORESAL) 10 MG tablet Take 10 mg by mouth 3 (three) times daily.    . benazepril (LOTENSIN) 20 MG tablet Take 20 mg by mouth daily.    . cefdinir (OMNICEF) 300 MG capsule Take 300 mg by mouth 2 (two) times daily.    Marland Kitchen CRANBERRY-VITAMIN C PO Take 2 capsules by mouth at bedtime. Dosage is the 25,200 mg cranberry/vitamin C supplement    . diphenhydrAMINE (SOMINEX) 25 MG tablet Take by mouth at bedtime.  Reported on 01/31/2016    . diphenoxylate-atropine (LOMOTIL) 2.5-0.025 MG per tablet Take by mouth 4 (four) times daily as needed for diarrhea or loose stools. One daily    . ezetimibe (ZETIA) 10 MG tablet Take 10 mg by mouth daily.    . ferrous sulfate 325 (65 FE) MG tablet Take 325 mg by mouth 2 (two) times daily.    . furosemide (LASIX) 80 MG tablet Take 80 mg by mouth daily.    Marland Kitchen gabapentin (NEURONTIN) 100 MG capsule Take 1 capsule (100 mg total) by mouth 3 (three) times daily. 90 capsule 3  . Insulin Pen Needle (QC PEN NEEDLES) 31G X 8 MM MISC Use as directed 3 x daily 100 each 2  . insulin regular human CONCENTRATED (HUMULIN R U-500 KWIKPEN) 500 UNIT/ML kwikpen Inject 130 Units into the skin 3 (three) times daily with meals. 10 pen 3  . lansoprazole (PREVACID) 30 MG capsule Take 30 mg by mouth daily.    Marland Kitchen levothyroxine (SYNTHROID, LEVOTHROID) 88 MCG tablet TAKE 1 TABLET ONCE A DAY BEFORE BREAKFAST 30 tablet 3  . Multiple Vitamins-Minerals (CENTRUM SILVER PO) Take 1 tablet by mouth daily.     . potassium chloride (K-DUR) 10 MEQ tablet Take 10 mEq by mouth daily.    . Probiotic Product (ALIGN) 4 MG CAPS Take 1 capsule by mouth daily. Reported on 01/31/2016    . propranolol (INDERAL) 10 MG tablet Take 2 tablets (20 mg total) by mouth daily. 60 tablet 11  . VICTOZA 18 MG/3ML SOPN INJECT 1.8MG SUBCUTANEOUSLY ONCE DAILY 9 mL 2   No current facility-administered medications for this visit.     Review of Systems  Constitutional: Negative.  Negative for chills, fever, malaise/fatigue and weight loss.       Pagophagia.  HENT: Negative.  Negative for congestion, hearing loss, nosebleeds, sore throat and tinnitus.   Eyes: Negative.  Negative for blurred vision, double vision, pain and discharge.  Respiratory: Negative.  Negative for cough, hemoptysis, sputum production, shortness of breath and wheezing.   Cardiovascular: Positive for leg swelling. Negative for chest pain, palpitations, claudication  and PND.  Gastrointestinal: Negative.  Negative for abdominal pain, blood in stool, constipation, diarrhea, heartburn, melena, nausea and vomiting.       IBS  Genitourinary: Negative.  Negative for dysuria, frequency, hematuria  and urgency.  Musculoskeletal: Negative.  Negative for falls, joint pain and myalgias.  Skin: Negative.  Negative for itching and rash.  Neurological: Negative.  Negative for dizziness, tingling, tremors, sensory change, speech change, focal weakness, seizures, loss of consciousness, weakness and headaches.  Endo/Heme/Allergies: Negative.  Does not bruise/bleed easily.  Psychiatric/Behavioral: Negative.  Negative for depression, memory loss, substance abuse and suicidal ideas. The patient is not nervous/anxious and does not have insomnia.   All other systems reviewed and are negative. 14 point ROS was done and is otherwise as detailed above or in HPI    PHYSICAL EXAMINATION: ECOG PERFORMANCE STATUS: 1 - Symptomatic but completely ambulatory  Vitals:   04/16/16 1059  BP: (!) 138/46  Pulse: 78  Resp: 18  Temp: 98.4 F (36.9 C)   Filed Weights   04/16/16 1059  Weight: 227 lb 11.2 oz (103.3 kg)    Physical Exam  Constitutional: She is oriented to person, place, and time and well-developed, well-nourished, and in no distress.  Obese. Wears glasses.  HENT:  Head: Normocephalic and atraumatic.  Nose: Nose normal.  Mouth/Throat: Oropharynx is clear and moist. No oropharyngeal exudate.  Eyes: Conjunctivae and EOM are normal. Pupils are equal, round, and reactive to light. Right eye exhibits no discharge. Left eye exhibits no discharge. No scleral icterus.  Neck: Normal range of motion. Neck supple. No tracheal deviation present. No thyromegaly present.  Cardiovascular: Normal rate, regular rhythm and normal heart sounds.  Exam reveals no gallop and no friction rub.   No murmur heard. Pulmonary/Chest: Effort normal and breath sounds normal. She has no wheezes.  She has no rales.  Abdominal: Soft. Bowel sounds are normal. She exhibits no distension and no mass. There is no tenderness. There is no rebound and no guarding.  Musculoskeletal: Normal range of motion. She exhibits edema.  Bilateral lower extremity edema (Chronic)  Lymphadenopathy:    She has no cervical adenopathy.  Neurological: She is alert and oriented to person, place, and time. She has normal reflexes. No cranial nerve deficit. Gait normal. Coordination normal.  Skin: Skin is warm and dry. No rash noted.  Psychiatric: Mood, memory, affect and judgment normal.  Nursing note and vitals reviewed.   LABORATORY DATA:  I have reviewed the data as listed Lab Results  Component Value Date   WBC 5.2 03/02/2016   HGB 10.9 (L) 03/02/2016   HCT 35.3 (L) 03/02/2016   MCV 89.6 03/02/2016   PLT SPECIMEN CHECKED FOR CLOTS 03/02/2016   CMP     Component Value Date/Time   NA 133 (L) 12/18/2015 1645   K 4.9 12/18/2015 1645   CL 97 (L) 12/18/2015 1645   CO2 27 12/18/2015 1645   GLUCOSE 305 (H) 12/18/2015 1645   BUN 33 (H) 12/18/2015 1645   CREATININE 1.14 (H) 12/18/2015 1645   CALCIUM 8.9 12/18/2015 1645   PROT 6.9 12/25/2015 1234   ALBUMIN 3.8 12/25/2015 1234   AST 29 12/25/2015 1234   ALT 17 12/25/2015 1234   ALKPHOS 85 12/25/2015 1234   BILITOT 0.4 12/25/2015 1234   GFRNONAA 51 (L) 12/18/2015 1645   GFRAA 59 (L) 12/18/2015 1645   Results for Penny, Martinez (MRN [id number]) as of 02/03/2016 20:29  Ref. Range 02/15/2014 17:25 02/16/2014 07:00 02/17/2014 05:34 02/18/2014 05:53 02/19/2014 05:42 02/20/2014 05:27 03/05/2014 20:53 03/06/2014 05:23  Hemoglobin Latest Ref Range: 12.0-15.0 g/dL 7.1 (L) 8.4 (L) 8.4 (L) 7.8 (L) 8.2 (L) 8.1 (L) 11.4 (L) 9.5 (L)   Results for Martinez, Penny (  MRN [id number]) as of 02/03/2016 20:29  Ref. Range 02/15/2014 21:02 12/25/2015 12:34  Ferritin Latest Ref Range: 20-288 ng/mL 4 (L) 26    RADIOGRAPHIC STUDIES: I have personally reviewed the radiological  images as listed and agreed with the findings in the report. No results found.  ASSESSMENT & PLAN:  Iron deficiency anemia Microcytosis secondary to above Rectal varices found on colonoscopy 2015 Cirrhosis Esophageal varices found on EGD 2015 Hospitalized in 2015 secondary to symptomatic anemia requiring PRBC transfusion Capsule study with multiple polypoid like lesions throughout small bowel, scattered superficial non-bleeding erosions Intolerance to oral iron  We discussed that her iron deficiency may be secondary to GI related blood loss from her varices. In addition it is likely she may have a component of malabsorption. She has difficulty tolerating oral iron and therefore I have recommended IV replacement. She is agreeable.   The most likely cause of her anemia is due to chronic blood loss/malabsorption syndrome. We discussed some of the risks, benefits, and alternatives of intravenous iron infusions. The patient is symptomatic from anemia and the iron level is critically low. She tolerated oral iron supplement poorly and desires to achieved higher levels of iron faster for adequate hematopoesis. Some of the side-effects to be expected including risks of infusion reactions, phlebitis, headaches, nausea and fatigue.  The patient is willing to proceed. Patient education material was dispensed.  Goal is to keep ferritin level greater than or equal to 100 ng/ml  I will see her back four to six weeks after her iron replacement for repeat labs. Additional recommendations will be made at that time. I do not feel a need for further anemia evaluation given her obvious iron deficiency and microcytic anemia however if her iron stores become replete and she remains anemic, additional evaluation will be pursued at that time.    ASSESSMENT & PLAN (04/16/16):   -I did advise her to call Dr. Liliane Channel office to see if they can see her sooner than 3 months for her neuropathy; it would not be unreasonable to  consider increasing her Neurontin to 300 mg TID for better control of her neuropathic pain.  I will defer to Dr. Liliane Channel recommendations on this.   -She did not get labs before her appointment today, so there were none to review with her.  We will obtain a complete blood panel and iron studies to assess how she's doing. The goal will be to keep her iron above 100, as per Dr. Donald Pore plan.  She will continue to hold oral iron supplementation.    -Her last IV iron infusion was back in June, and if she needs more iron today, we'll get her scheduled to do that. We'll call her with the results of her labs from today.   -She will follow-up with Dr. Whitney Muse in 1 month.    Orders Placed This Encounter  Procedures  . CBC with Differential    Standing Status:   Future    Standing Expiration Date:   04/16/2017  . Comprehensive metabolic panel    Standing Status:   Future    Standing Expiration Date:   04/16/2017  . Iron and TIBC    Standing Status:   Future    Standing Expiration Date:   04/16/2017  . Ferritin    Standing Status:   Future    Standing Expiration Date:   04/16/2017    MEDICATIONS PRESCRIBED THIS ENCOUNTER: No orders of the defined types were placed in this encounter.  All questions  were answered. The patient knows to call the clinic with any problems, questions or concerns.  This document serves as a record of services personally performed by Mike Craze, NP. It was created on her behalf by Toni Amend, a trained medical scribe. The creation of this record is based on the scribe's personal observations and the provider's statements to them. This document has been checked and approved by the attending provider.  I have reviewed the above documentation for accuracy and completeness and I agree with the above. This note was electronically signed.    Mike Craze, NP 04/16/2016 11:48 AM

## 2016-04-20 ENCOUNTER — Other Ambulatory Visit (HOSPITAL_COMMUNITY): Payer: Self-pay | Admitting: Oncology

## 2016-04-24 ENCOUNTER — Encounter (HOSPITAL_BASED_OUTPATIENT_CLINIC_OR_DEPARTMENT_OTHER): Payer: BLUE CROSS/BLUE SHIELD

## 2016-04-24 VITALS — BP 131/45 | HR 81 | Temp 98.4°F | Resp 20

## 2016-04-24 DIAGNOSIS — D500 Iron deficiency anemia secondary to blood loss (chronic): Secondary | ICD-10-CM | POA: Diagnosis not present

## 2016-04-24 MED ORDER — SODIUM CHLORIDE 0.9 % IV SOLN
Freq: Once | INTRAVENOUS | Status: AC
  Administered 2016-04-24: 15:00:00 via INTRAVENOUS

## 2016-04-24 MED ORDER — SODIUM CHLORIDE 0.9 % IV SOLN
510.0000 mg | Freq: Once | INTRAVENOUS | Status: AC
  Administered 2016-04-24: 510 mg via INTRAVENOUS
  Filled 2016-04-24: qty 17

## 2016-04-24 NOTE — Patient Instructions (Signed)
[place] at [facility] Discharge Instructions  RECOMMENDATIONS MADE BY THE CONSULTANT AND ANY TEST RESULTS WILL BE SENT TO YOUR REFERRING PHYSICIAN.  Received Feraheme today. Follow-up as scheduled. Call clinic for any questions or concerns  Thank you for choosing [place] at [facility] to provide your oncology and hematology care.  To afford each patient quality time with our provider, please arrive at least 15 minutes before your scheduled appointment time.   Beginning January 23rd 2017 lab work for the [facility] will be done in the  Main lab at [facility] on 1st floor. If you have a lab appointment with the [facility] please come in thru the  Main Entrance and check in at the main information desk  You need to re-schedule your appointment should you arrive 10 or more minutes late.  We strive to give you quality time with our providers, and arriving late affects you and other patients whose appointments are after yours.  Also, if you no show three or more times for appointments you may be dismissed from the clinic at the providers discretion.     Again, thank you for choosing [facility].  Our hope is that these requests will decrease the amount of time that you wait before being seen by our physicians.       _____________________________________________________________  Should you have questions after your visit to [facility], please contact our office at [phone] between the hours of 8:30 a.m. and 4:30 p.m.  Voicemails left after 4:30 p.m. will not be returned until the following business day.  For prescription refill requests, have your pharmacy contact our office.         Resources For Cancer Patients and their Caregivers ? American Cancer Society: Can assist with transportation, wigs, general needs, runs Look Good Feel Better.        (226)393-8749 ? Cancer Care: Provides financial  assistance, online support groups, medication/co-pay assistance.  1-800-813-HOPE 475-340-8582) ? [facility] Assists [place] cancer patients and their families through emotional , educational and financial support.  (760)819-6570 ? [place] DSS Where to apply for food stamps, Medicaid and utility assistance. (901)686-1139 ? [facility]: Transportation to medical appointments. 6475746901 ? Social Security Administration: May apply for disability if have a Stage IV cancer. 705 178 2206 (504)328-5037 ? [employer], Disability and Transit Services: Assists with nutrition, care and transit needs. [facility] Support Programs: @10RELATIVEDAYS @ > Cancer Support Group  2nd Tuesday of the month 1pm-2pm, Journey Room  > Creative Journey  3rd Tuesday of the month 1130am-1pm, Journey Room  > Look Good Feel Better  1st Wednesday of the month 10am-12 noon, Journey Room (Call [facility] to register [phone])

## 2016-04-24 NOTE — Progress Notes (Signed)
Penny Martinez tolerated Feraheme well without complaints. VSS remained stable 30 after infusion completed. Pt discharged self ambulatory in satisfactory condition

## 2016-05-07 ENCOUNTER — Other Ambulatory Visit: Payer: Self-pay

## 2016-05-07 DIAGNOSIS — K7460 Unspecified cirrhosis of liver: Secondary | ICD-10-CM

## 2016-05-18 ENCOUNTER — Encounter (HOSPITAL_COMMUNITY): Payer: BLUE CROSS/BLUE SHIELD | Attending: Hematology & Oncology | Admitting: Hematology & Oncology

## 2016-05-18 ENCOUNTER — Encounter (HOSPITAL_COMMUNITY): Payer: BLUE CROSS/BLUE SHIELD

## 2016-05-18 ENCOUNTER — Encounter (HOSPITAL_COMMUNITY): Payer: Self-pay | Admitting: Hematology & Oncology

## 2016-05-18 VITALS — BP 157/64 | HR 84 | Temp 98.7°F | Resp 18 | Wt 226.3 lb

## 2016-05-18 DIAGNOSIS — K7469 Other cirrhosis of liver: Secondary | ICD-10-CM

## 2016-05-18 DIAGNOSIS — D649 Anemia, unspecified: Secondary | ICD-10-CM

## 2016-05-18 DIAGNOSIS — D500 Iron deficiency anemia secondary to blood loss (chronic): Secondary | ICD-10-CM

## 2016-05-18 DIAGNOSIS — D732 Chronic congestive splenomegaly: Secondary | ICD-10-CM

## 2016-05-18 LAB — COMPREHENSIVE METABOLIC PANEL
ALT: 38 U/L (ref 14–54)
ANION GAP: 6 (ref 5–15)
AST: 47 U/L — ABNORMAL HIGH (ref 15–41)
Albumin: 3.9 g/dL (ref 3.5–5.0)
Alkaline Phosphatase: 94 U/L (ref 38–126)
BUN: 22 mg/dL — ABNORMAL HIGH (ref 6–20)
CHLORIDE: 99 mmol/L — AB (ref 101–111)
CO2: 31 mmol/L (ref 22–32)
Calcium: 8.9 mg/dL (ref 8.9–10.3)
Creatinine, Ser: 1 mg/dL (ref 0.44–1.00)
GFR calc non Af Amer: 58 mL/min — ABNORMAL LOW (ref >60)
Glucose, Bld: 288 mg/dL — ABNORMAL HIGH (ref 65–99)
Potassium: 4.3 mmol/L (ref 3.5–5.1)
SODIUM: 136 mmol/L (ref 135–145)
Total Bilirubin: 0.6 mg/dL (ref 0.3–1.2)
Total Protein: 7.3 g/dL (ref 6.5–8.1)

## 2016-05-18 LAB — CBC WITH DIFFERENTIAL/PLATELET
Basophils Absolute: 0 10*3/uL (ref 0.0–0.1)
Basophils Relative: 0 %
EOS ABS: 0 10*3/uL (ref 0.0–0.7)
EOS PCT: 1 %
HCT: 38 % (ref 36.0–46.0)
Hemoglobin: 12.2 g/dL (ref 12.0–15.0)
LYMPHS ABS: 0.9 10*3/uL (ref 0.7–4.0)
Lymphocytes Relative: 21 %
MCH: 30.3 pg (ref 26.0–34.0)
MCHC: 32.1 g/dL (ref 30.0–36.0)
MCV: 94.3 fL (ref 78.0–100.0)
MONOS PCT: 9 %
Monocytes Absolute: 0.4 10*3/uL (ref 0.1–1.0)
Neutro Abs: 3.1 10*3/uL (ref 1.7–7.7)
Neutrophils Relative %: 69 %
PLATELETS: 74 10*3/uL — AB (ref 150–400)
RBC: 4.03 MIL/uL (ref 3.87–5.11)
RDW: 15.4 % (ref 11.5–15.5)
WBC: 4.4 10*3/uL (ref 4.0–10.5)

## 2016-05-18 NOTE — Progress Notes (Signed)
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 female is here because of referral by [facility] for anemia and iron deficiency.  She has received 3 doses IV iron, last dose give on 8/18.   Mrs. Hush is unaccompanied. I personally reviewed and went over laboratory studies with the patient.  She does not really feel better. She has noticed she feels less fatigued, however she is still dealing with "swelling issues." She has an abdominal ultrasound scheduled at the end of the month.  She continues to bruise easily when she hits or bumps into something. This is her normal.  She denies any other abnormal bleeding.  She is up to date on mammograms. She denies pica, pagophagia.    MEDICAL HISTORY:  Past Medical History:  Diagnosis Date  . Acid reflux   . C. difficile colitis June/July 2015  . CHF (congestive heart failure) ([place])   . Cirrhosis ([place])    likely due to NASH. Negative viral markers 2015  . COPD (chronic obstructive pulmonary disease) ([place])   . Diabetes mellitus without complication ([place])   . Hypercholesteremia   . Hypertension   . IBS (irritable bowel syndrome)     SURGICAL HISTORY: Past Surgical History:  Procedure Laterality Date  . ABDOMINAL HYSTERECTOMY    . APPENDECTOMY    . 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  CHOLECYSTECTOMY    . COLONOSCOPY N/A 02/19/2014   Dr. Gala Romney: rectal varices, rectal polyp overlying a varix non-manipulated  . ESOPHAGOGASTRODUODENOSCOPY N/A 02/19/2014   Dr. Gala Romney: esophageal varices, multiple gastric polyps with largest biopsied and hyperplastic. Negative H.pylori  . GIVENS CAPSULE STUDY N/A 05/04/2014   multiple polypoid-like lesions throughout small bowel, scattered superficial non-bleeding erosions more distal bowel, referred to [facility] for enteroscopy. Capsule study not complete, poor images.   . urosepsis      SOCIAL HISTORY: Social History   Social History  . Marital status: Married    Spouse name: N/A  . Number of children: N/A  . Years of education: N/A   Occupational History  . Not on file.   Social History Main Topics  . Smoking status: Former Smoker    Packs/day: 2.00    Years: 35.00    Quit date: 05/05/2003  . Smokeless tobacco: Former Systems developer     Comment: Quit smoking 11 years ago  . Alcohol use No  . Drug use: No  . Sexual activity: Not Currently    Birth control/ protection: Surgical   Other Topics Concern  . Not on file   Social History Narrative  . 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 ETOH, none She used to crossstitch but no longer can with her hands.  FAMILY HISTORY: Family History  Problem Relation Age of Onset  . Diabetes Mother   . Hypertension Mother   .  Colon cancer Neg Hx    Mother was diabetic and ended up on dialysis, she died at 25 years-old Father died of an aneurysm at 50 years-old 32 brother and 1 sister Brother is borderline diabetic with heart issues Sister is fairly healthy  ALLERGIES:  is allergic to niacin; penicillins; erythromycin; statins; and sulfa antibiotics.  MEDICATIONS:  Current Outpatient Prescriptions  Medication Sig Dispense Refill  . amitriptyline (ELAVIL) 25 MG tablet Take 25  mg by mouth at bedtime.    Marland Kitchen aspirin 81 MG tablet Take 81 mg by mouth daily.    . baclofen (LIORESAL) 10 MG tablet Take 10 mg by mouth 3 (three) times daily.    . benazepril (LOTENSIN) 20 MG tablet Take 20 mg by mouth daily.    . cefdinir (OMNICEF) 300 MG capsule Take 300 mg by mouth 2 (two) times daily.    Marland Kitchen CRANBERRY-VITAMIN C PO Take 2 capsules by mouth at bedtime. Dosage is the 25,200 mg cranberry/vitamin C supplement    . diphenhydrAMINE (SOMINEX) 25 MG tablet Take by mouth at bedtime. Reported on 01/31/2016    . diphenoxylate-atropine (LOMOTIL) 2.5-0.025 MG per tablet Take by mouth 4 (four) times daily as needed for diarrhea or loose stools. One daily    . ezetimibe (ZETIA) 10 MG tablet Take 10 mg by mouth daily.    . furosemide (LASIX) 80 MG tablet Take 80 mg by mouth daily.    Marland Kitchen gabapentin (NEURONTIN) 100 MG capsule Take 1 capsule (100 mg total) by mouth 3 (three) times daily. 90 capsule 3  . Insulin Pen Needle (QC PEN NEEDLES) 31G X 8 MM MISC Use as directed 3 x daily 100 each 2  . insulin regular human CONCENTRATED (HUMULIN R U-500 KWIKPEN) 500 UNIT/ML kwikpen Inject 130 Units into the skin 3 (three) times daily with meals. 10 pen 3  . lansoprazole (PREVACID) 30 MG capsule Take 30 mg by mouth daily.    Marland Kitchen levothyroxine (SYNTHROID, LEVOTHROID) 88 MCG tablet TAKE 1 TABLET ONCE A DAY BEFORE BREAKFAST 30 tablet 3  . Multiple Vitamins-Minerals (CENTRUM SILVER PO) Take 1 tablet by mouth daily.     . potassium chloride (K-DUR) 10 MEQ tablet Take 10 mEq by mouth daily.    . Probiotic Product (ALIGN) 4 MG CAPS Take 1 capsule by mouth daily. Reported on 01/31/2016    . propranolol (INDERAL) 10 MG tablet Take 2 tablets (20 mg total) by mouth daily. 60 tablet 11  . VICTOZA 18 MG/3ML SOPN INJECT 1.8MG SUBCUTANEOUSLY ONCE DAILY 9 mL 2   No current facility-administered medications for this visit.     Review of Systems  Constitutional: Negative.  Negative for chills, fever, malaise/fatigue and  weight loss.  HENT: Negative.  Negative for congestion, hearing loss, nosebleeds, sore throat and tinnitus.   Eyes: Negative.  Negative for blurred vision, double vision, pain and discharge.  Respiratory: Negative.  Negative for cough, hemoptysis, sputum production, shortness of breath and wheezing.   Cardiovascular: Positive for leg swelling. Negative for chest pain, palpitations, claudication and PND.  Gastrointestinal: Negative.  Negative for abdominal pain, blood in stool, constipation, diarrhea, heartburn, melena, nausea and vomiting.       IBS  Genitourinary: Negative.  Negative for dysuria, frequency, hematuria and urgency.  Musculoskeletal: Negative.  Negative for falls, joint pain and myalgias.  Skin: Negative.  Negative for itching and rash.  Neurological: Negative.  Negative for dizziness, tingling, tremors, sensory change, speech change, focal weakness, seizures, loss of consciousness, weakness and headaches.  Endo/Heme/Allergies: Negative.  Does not bruise/bleed easily.  Psychiatric/Behavioral: Negative.  Negative for depression, memory loss, substance abuse and suicidal ideas. The patient is not nervous/anxious and does not have insomnia.   All other systems reviewed and are negative. 14 point ROS was done and is otherwise as detailed above or in HPI   PHYSICAL EXAMINATION: ECOG PERFORMANCE STATUS: 1 - Symptomatic but completely ambulatory  Vitals:   05/18/16 1419  BP: (!) 157/64  Pulse: 84  Resp: 18  Temp: 98.7 F (37.1 C)   Filed Weights   05/18/16 1419  Weight: 226 lb 4.8 oz (102.6 kg)    Physical Exam  Constitutional: She is oriented to person, place, and time and well-developed, well-nourished, and in no distress.  Obese. Wears glasses.  HENT:  Head: Normocephalic and atraumatic.  Nose: Nose normal.  Mouth/Throat: Oropharynx is clear and moist. No oropharyngeal exudate.  Eyes: Conjunctivae and EOM are normal. Pupils are equal, round, and reactive to light.  Right eye exhibits no discharge. Left eye exhibits no discharge. No scleral icterus.  Neck: Normal range of motion. Neck supple. No tracheal deviation present. No thyromegaly present.  Cardiovascular: Normal rate, regular rhythm and normal heart sounds.  Exam reveals no gallop and no friction rub.   No murmur heard. Pulmonary/Chest: Effort normal and breath sounds normal. She has no wheezes. She has no rales.  Abdominal: Soft. Bowel sounds are normal. She exhibits no distension and no mass. There is no tenderness. There is no rebound and no guarding.  Musculoskeletal: Normal range of motion. She exhibits edema.  Bilateral lower extremity woody edema (Chronic)  Lymphadenopathy:    She has no cervical adenopathy.  Neurological: She is alert and oriented to person, place, and time. She has normal reflexes. No cranial nerve deficit. Gait normal. Coordination normal.  Skin: Skin is warm and dry. No rash noted.  Psychiatric: Mood, memory, affect and judgment normal.  Nursing note and vitals reviewed.   LABORATORY DATA:  I have reviewed the data as listed Lab Results  Component Value Date   WBC 4.4 05/18/2016   HGB 12.2 05/18/2016   HCT 38.0 05/18/2016   MCV 94.3 05/18/2016   PLT 74 (L) 05/18/2016   CMP     Component Value Date/Time   NA 136 05/18/2016 1241   K 4.3 05/18/2016 1241   CL 99 (L) 05/18/2016 1241   CO2 31 05/18/2016 1241   GLUCOSE 288 (H) 05/18/2016 1241   BUN 22 (H) 05/18/2016 1241   CREATININE 1.00 05/18/2016 1241   CREATININE 1.14 (H) 12/18/2015 1645   CALCIUM 8.9 05/18/2016 1241   PROT 7.3 05/18/2016 1241   ALBUMIN 3.9 05/18/2016 1241   AST 47 (H) 05/18/2016 1241   ALT 38 05/18/2016 1241   ALKPHOS 94 05/18/2016 1241   BILITOT 0.6 05/18/2016 1241   GFRNONAA 58 (L) 05/18/2016 1241   GFRNONAA 51 (L) 12/18/2015 1645   GFRAA >60 05/18/2016 1241   GFRAA 59 (L) 12/18/2015 1645   Results for LAUREN, MODISETTE (MRN [id number]) as of 05/18/2016 13:57  Ref. Range  03/05/2014 20:53 03/06/2014 05:23 03/02/2016 11:40 04/16/2016 11:56 05/18/2016 12:41  Hemoglobin Latest Ref Range: 12.0 - 15.0 g/dL 11.4 (L) 9.5 (L) 10.9 (L) 11.7 (L) 12.2    Results for HADLI, VANDEMARK (MRN [id number]) as of 05/18/2016 13:57  Ref. Range 02/15/2014 21:02 12/25/2015 12:34 03/02/2016 11:40 04/16/2016 11:57  Ferritin Latest Ref Range: 11 - 307 ng/mL 4 (L) 26 155 35     RADIOGRAPHIC STUDIES:  I have personally reviewed the radiological images as listed and agreed with the findings in the report. No results found.  ASSESSMENT & PLAN:  Iron deficiency anemia secondary to chronic GI related blood loss Microcytosis secondary to above Rectal varices found on colonoscopy 2015 Cirrhosis Esophageal varices found on EGD 2015 Hospitalized in 2015 secondary to symptomatic anemia requiring PRBC transfusion Capsule study with multiple polypoid like lesions throughout small bowel, scattered superficial non-bleeding erosions Intolerance to oral iron  We discussed that her iron deficiency may be secondary to GI related blood loss from her varices. In addition it is likely she may have a component of malabsorption. She has difficulty tolerating oral iron. She has done well with IV iron. Counts are markedly improved. .  Goal is to keep ferritin level greater than or equal to 100 ng/ml  Last iron infusion was 04/24/2016.  She has an abdominal ultrasound scheduled for 06/04/2016.  She is scheduled for follow up in GI on 06/16/2016 and follow up with endocrinology on 07/07/2016.   She will return for labs every 2 months and return for follow up in 6 months. Iron will be replaced as needed.    Orders Placed This Encounter  Procedures  . CBC with Differential    Standing Status:   Standing    Number of Occurrences:   8    Standing Expiration Date:   05/18/2017  . Ferritin    Standing Status:   Standing    Number of Occurrences:   8    Standing Expiration Date:   05/18/2017    All questions  were answered. The patient knows to call the clinic with any problems, questions or concerns.  This document serves as a record of services personally performed by Ancil Linsey, MD. It was created on her behalf by Arlyce Harman, a trained medical scribe. The creation of this record is based on the scribe's personal observations and the provider's statements to them. This document has been checked and approved by the attending provider.  I have reviewed the above documentation for accuracy and completeness, and I agree with the above.  This note was electronically signed.    Molli Hazard, MD  05/18/2016 3:09 PM

## 2016-05-18 NOTE — Patient Instructions (Addendum)
[place] at [facility] Discharge Instructions  RECOMMENDATIONS MADE BY THE CONSULTANT AND ANY TEST RESULTS WILL BE SENT TO YOUR REFERRING PHYSICIAN.  You saw Dr. Whitney Muse today. Labs every 8 weeks. Follow up in 6 months.  Thank you for choosing [place] at [facility] to provide your oncology and hematology care.  To afford each patient quality time with our provider, please arrive at least 15 minutes before your scheduled appointment time.   Beginning January 23rd 2017 lab work for the [facility] will be done in the  Main lab at [facility] on 1st floor. If you have a lab appointment with the [facility] please come in thru the  Main Entrance and check in at the main information desk  You need to re-schedule your appointment should you arrive 10 or more minutes late.  We strive to give you quality time with our providers, and arriving late affects you and other patients whose appointments are after yours.  Also, if you no show three or more times for appointments you may be dismissed from the clinic at the providers discretion.     Again, thank you for choosing [facility].  Our hope is that these requests will decrease the amount of time that you wait before being seen by our physicians.       _____________________________________________________________  Should you have questions after your visit to [facility], please contact our office at [phone] between the hours of 8:30 a.m. and 4:30 p.m.  Voicemails left after 4:30 p.m. will not be returned until the following business day.  For prescription refill requests, have your pharmacy contact our office.         Resources For Cancer Patients and their Caregivers ? American Cancer Society: Can assist with transportation, wigs, general needs, runs Look Good Feel Better.        (715)030-3795 ? Cancer Care: Provides financial assistance, online support  groups, medication/co-pay assistance.  1-800-813-HOPE 787-097-7340) ? [place] Assists [place] cancer patients and their families through emotional , educational and financial support.  917-005-1523 ? [place] DSS Where to apply for food stamps, Medicaid and utility assistance. 915-414-4092 ? [facility]: Transportation to medical appointments. 301-153-5540 ? Social Security Administration: May apply for disability if have a Stage IV cancer. 3033662091 (843) 212-3776 ? [employer], Disability and Transit Services: Assists with nutrition, care and transit needs. [place] Support Programs: @10RELATIVEDAYS @ > Cancer Support Group  2nd Tuesday of the month 1pm-2pm, Journey Room  > Creative Journey  3rd Tuesday of the month 1130am-1pm, Journey Room  > Look Good Feel Better  1st Wednesday of the month 10am-12 noon, Journey Room (Call [facility] to register [phone])

## 2016-05-19 ENCOUNTER — Other Ambulatory Visit: Payer: Self-pay

## 2016-05-19 ENCOUNTER — Encounter (HOSPITAL_COMMUNITY): Payer: Self-pay | Admitting: Hematology & Oncology

## 2016-05-19 DIAGNOSIS — R0609 Other forms of dyspnea: Principal | ICD-10-CM

## 2016-05-19 LAB — IRON AND TIBC
IRON: 92 ug/dL (ref 28–170)
Saturation Ratios: 32 % — ABNORMAL HIGH (ref 10.4–31.8)
TIBC: 291 ug/dL (ref 250–450)
UIBC: 199 ug/dL

## 2016-05-19 LAB — FERRITIN: Ferritin: 140 ng/mL (ref 11–307)

## 2016-05-20 ENCOUNTER — Encounter (HOSPITAL_COMMUNITY)
Admission: RE | Admit: 2016-05-20 | Discharge: 2016-05-20 | Disposition: A | Discharge status: Home / Self Care | Payer: BLUE CROSS/BLUE SHIELD | Source: Ambulatory Visit | Attending: Cardiology | Admitting: Cardiology

## 2016-05-20 ENCOUNTER — Inpatient Hospital Stay (HOSPITAL_COMMUNITY): Admission: RE | Admit: 2016-05-20 | Payer: BLUE CROSS/BLUE SHIELD | Source: Ambulatory Visit

## 2016-05-20 ENCOUNTER — Encounter (HOSPITAL_COMMUNITY): Payer: Self-pay

## 2016-05-20 DIAGNOSIS — R0609 Other forms of dyspnea: Secondary | ICD-10-CM | POA: Insufficient documentation

## 2016-05-20 LAB — NM MYOCAR MULTI W/SPECT W/WALL MOTION / EF
CHL CUP NUCLEAR SSS: 12
CSEPPHR: 96 {beats}/min
LHR: 0.4
LVDIAVOL: 73 mL (ref 46–106)
LVSYSVOL: 20 mL
Rest HR: 81 {beats}/min
SDS: 2
SRS: 10
TID: 1.02

## 2016-05-20 MED ORDER — TECHNETIUM TC 99M TETROFOSMIN IV KIT
30.0000 | PACK | Freq: Once | INTRAVENOUS | Status: AC | PRN
  Administered 2016-05-20: 30 via INTRAVENOUS

## 2016-05-20 MED ORDER — SODIUM CHLORIDE 0.9% FLUSH
INTRAVENOUS | Status: AC
  Administered 2016-05-20: 10 mL via INTRAVENOUS
  Filled 2016-05-20: qty 10

## 2016-05-20 MED ORDER — REGADENOSON 0.4 MG/5ML IV SOLN
INTRAVENOUS | Status: AC
  Administered 2016-05-20: 0.4 mg via INTRAVENOUS
  Filled 2016-05-20: qty 5

## 2016-05-20 MED ORDER — TECHNETIUM TC 99M TETROFOSMIN IV KIT
10.0000 | PACK | Freq: Once | INTRAVENOUS | Status: AC | PRN
  Administered 2016-05-20: 10 via INTRAVENOUS

## 2016-06-04 ENCOUNTER — Ambulatory Visit (HOSPITAL_COMMUNITY): Payer: BLUE CROSS/BLUE SHIELD

## 2016-06-05 ENCOUNTER — Ambulatory Visit (HOSPITAL_COMMUNITY): Admission: RE | Admit: 2016-06-05 | Payer: BLUE CROSS/BLUE SHIELD | Source: Ambulatory Visit

## 2016-06-12 ENCOUNTER — Ambulatory Visit (HOSPITAL_COMMUNITY)
Admission: RE | Admit: 2016-06-12 | Discharge: 2016-06-12 | Disposition: A | Discharge status: Home / Self Care | Payer: BLUE CROSS/BLUE SHIELD | Source: Ambulatory Visit | Attending: Gastroenterology | Admitting: Gastroenterology

## 2016-06-12 DIAGNOSIS — Z9049 Acquired absence of other specified parts of digestive tract: Secondary | ICD-10-CM | POA: Insufficient documentation

## 2016-06-12 DIAGNOSIS — K7460 Unspecified cirrhosis of liver: Secondary | ICD-10-CM | POA: Insufficient documentation

## 2016-06-12 DIAGNOSIS — R161 Splenomegaly, not elsewhere classified: Secondary | ICD-10-CM | POA: Insufficient documentation

## 2016-06-16 ENCOUNTER — Encounter: Payer: Self-pay | Admitting: Gastroenterology

## 2016-06-16 ENCOUNTER — Ambulatory Visit (INDEPENDENT_AMBULATORY_CARE_PROVIDER_SITE_OTHER): Payer: BLUE CROSS/BLUE SHIELD | Admitting: Gastroenterology

## 2016-06-16 VITALS — BP 111/45 | HR 80 | Temp 98.2°F | Ht 61.5 in | Wt 227.2 lb

## 2016-06-16 DIAGNOSIS — K7469 Other cirrhosis of liver: Secondary | ICD-10-CM

## 2016-06-16 MED ORDER — DIPHENOXYLATE-ATROPINE 2.5-0.025 MG PO TABS: ORAL_TABLET | ORAL | 3 refills | Status: DC

## 2016-06-16 NOTE — Progress Notes (Signed)
Referring Provider: Vesta Mixer Primary Care Physician:  Antionette Fairy, PA-C  Chief Complaint  Patient presents with  . Follow-up    abd pain, diarrhea at times    HPI:   Penny Martinez is a 65 y.o. female presenting today with a history of NASH cirrhosis, on Inderal for variceal prophylaxis. History of IDA. US abdomen up-to-date and due again in April 2018. Followed by hematology.   No added salt. Feels like she is swelling everywhere but mainly in her lower extremities. Abdomen feels larger. Lasix 80 mg daily. Dr. Odette Fraction cardiologist manages diuretic therapy. Feels like swelling started with humulin. Will be seeing Dr. Dorris Fetch soon for management. Would like to discuss with him first prior to adjusting any diuretics.   Has episodes of multiple frequent stools per day but not watery. Likes Lomotil the best.   Past Medical History:  Diagnosis Date  . Acid reflux   . C. difficile colitis June/July 2015  . CHF (congestive heart failure) ([place])   . Cirrhosis ([place])    likely due to NASH. Negative viral markers 2015  . COPD (chronic obstructive pulmonary disease) ([place])   . Diabetes mellitus without complication ([place])   . Hypercholesteremia   . Hypertension   . IBS (irritable bowel syndrome)     Past Surgical History:  Procedure Laterality Date  . ABDOMINAL HYSTERECTOMY    . APPENDECTOMY    . 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 CHOLECYSTECTOMY    . COLONOSCOPY N/A 02/19/2014   Dr. Gala Romney: rectal varices, rectal polyp overlying a varix non-manipulated  . ESOPHAGOGASTRODUODENOSCOPY N/A 02/19/2014   Dr. Gala Romney: esophageal varices, multiple gastric polyps with largest biopsied and hyperplastic. Negative H.pylori  . GIVENS CAPSULE STUDY N/A 05/04/2014   multiple polypoid-like lesions throughout small bowel, scattered superficial non-bleeding erosions more distal bowel, referred to [facility]oscopy. Capsule study not complete, poor images.   .  urosepsis      Current Outpatient Prescriptions  Medication Sig Dispense Refill  . amitriptyline (ELAVIL) 25 MG tablet Take 25 mg by mouth at bedtime.    Marland Kitchen aspirin 81 MG tablet Take 81 mg by mouth daily.    . baclofen (LIORESAL) 10 MG tablet Take 10 mg by mouth 3 (three) times daily.    . benazepril (LOTENSIN) 20 MG tablet Take 20 mg by mouth daily.    Marland Kitchen CRANBERRY-VITAMIN C PO Take 2 capsules by mouth at bedtime. Dosage is the 25,200 mg cranberry/vitamin C supplement    . diphenhydrAMINE (SOMINEX) 25 MG tablet Take by mouth at bedtime. Reported on 01/31/2016    . diphenoxylate-atropine (LOMOTIL) 2.5-0.025 MG per tablet Take by mouth 4 (four) times daily as needed for diarrhea or loose stools. One daily    . ezetimibe (ZETIA) 10 MG tablet Take 10 mg by mouth daily.    . furosemide (LASIX) 80 MG tablet Take 80 mg by mouth daily.    Marland Kitchen gabapentin (NEURONTIN) 100 MG capsule Take 1 capsule (100 mg total) by mouth 3 (three) times daily. 90 capsule 3  . Insulin Pen Needle (QC PEN NEEDLES) 31G X 8 MM MISC Use as directed 3 x daily 100 each 2  . insulin regular human CONCENTRATED (HUMULIN R U-500 KWIKPEN) 500 UNIT/ML kwikpen Inject 130 Units into the skin 3 (three) times daily with meals. 10 pen 3  . lansoprazole (PREVACID) 30 MG capsule Take 30 mg by mouth daily.    Marland Kitchen levothyroxine (SYNTHROID,  LEVOTHROID) 88 MCG tablet TAKE 1 TABLET ONCE A DAY BEFORE BREAKFAST 30 tablet 3  . Multiple Vitamins-Minerals (CENTRUM SILVER PO) Take 1 tablet by mouth daily.     . nitrofurantoin, macrocrystal-monohydrate, (MACROBID) 100 MG capsule Take 100 mg by mouth at bedtime.  1  . potassium chloride (K-DUR) 10 MEQ tablet Take 10 mEq by mouth daily.    . propranolol (INDERAL) 10 MG tablet Take 2 tablets (20 mg total) by mouth daily. 60 tablet 11  . VICTOZA 18 MG/3ML SOPN INJECT 1.8MG SUBCUTANEOUSLY ONCE DAILY 9 mL 2  . cefdinir (OMNICEF) 300 MG capsule Take 300 mg by mouth 2 (two) times daily.    . Probiotic Product  (ALIGN) 4 MG CAPS Take 1 capsule by mouth daily. Reported on 01/31/2016     No current facility-administered medications for this visit.     Allergies as of 06/16/2016 - Review Complete 06/16/2016  Allergen Reaction Noted  . Niacin Rash and Shortness Of Breath 03/05/2014  . Penicillins Shortness Of Breath 02/15/2014  . Erythromycin Hives 02/15/2014  . Statins  03/05/2014  . Sulfa antibiotics  02/15/2014    Family History  Problem Relation Age of Onset  . Diabetes Mother   . Hypertension Mother   . Colon cancer Neg Hx     Social History   Social History  . Marital status: Married    Spouse name: N/A  . Number of children: N/A  . Years of education: N/A   Social History Main Topics  . Smoking status: Former Smoker    Packs/day: 2.00    Years: 35.00    Quit date: 05/05/2003  . Smokeless tobacco: Former Systems developer     Comment: Quit smoking 11 years ago  . Alcohol use No  . Drug use: No  . Sexual activity: Not Currently    Birth control/ protection: Surgical   Other Topics Concern  . None   Social History Narrative  . None    Review of Systems: As mentioned in HPI   Physical Exam: BP (!) 111/45   Pulse 80   Temp 98.2 F (36.8 C) (Oral)   Ht 5' 1.5" (1.562 m)   Wt 227 lb 3.2 oz (103.1 kg)   BMI 42.23 kg/m  General:   Alert and oriented. No distress noted. Pleasant and cooperative.  Head:  Normocephalic and atraumatic. Eyes:  Conjuctiva clear without scleral icterus. Mouth:  Oral mucosa pink and moist. Good dentition. No lesions. Abdomen:  +BS, soft, round, large AP diameter, difficult to appreciate HSM due to large AP diameter.  Msk:  Symmetrical without gross deformities. Normal posture. Extremities:  1+ trace lower extremity edema  Neurologic:  Alert and  oriented x4;  grossly normal neurologically. Psych:  Alert and cooperative. Normal mood and affect.  Lab Results  Component Value Date   WBC 4.4 05/18/2016   HGB 12.2 05/18/2016   HCT 38.0 05/18/2016    MCV 94.3 05/18/2016   PLT 74 (L) 05/18/2016   Lab Results  Component Value Date   IRON 92 05/18/2016   TIBC 291 05/18/2016   FERRITIN 140 05/18/2016   Lab Results  Component Value Date   ALT 38 05/18/2016   AST 47 (H) 05/18/2016   ALKPHOS 94 05/18/2016   BILITOT 0.6 05/18/2016

## 2016-06-16 NOTE — Patient Instructions (Signed)
Let's see what Dr. Dorris Fetch says regarding insulin. You may or may not need diuretic adjustments in the future.   I am going to see when your next upper endoscopy is due.   Your next ultrasound is due in April 2018.   We will see you in 6 months or sooner if needed.   I have refilled your Lomotil for you.

## 2016-06-18 ENCOUNTER — Other Ambulatory Visit: Payer: Self-pay | Admitting: "Endocrinology

## 2016-06-19 ENCOUNTER — Telehealth: Payer: Self-pay | Admitting: Gastroenterology

## 2016-06-19 NOTE — Assessment & Plan Note (Signed)
Likely NASH cirrhosis, due for [place] again in April 2018. Mild lower extremity edema noted on exam: patient feels she noted a distinct change in fluid retention when changing insulin. Already taking Lasix 80 mg daily. With cardiac history, would feel more comfortable in deferring to cardiologist for diuretic management, and I would like to get input from endocrinologist regarding other modalities for diabetic management, as she feels insulin therapy has been a significant contributor to this. On exam, she has trace lower extremity edema, and there is no evidence of ascites on abdominal exam. She will be seeing Dr. Dorris Fetch in the very near future. She will call us with an update after she is seen. Continue inderal for variceal bleeding prophylaxis; no EGD unless evidence of bleeding. Return in 6 months.

## 2016-06-19 NOTE — Telephone Encounter (Signed)
Please let patient know that while she is on Inderal, we don't have to do an EGD for variceal surveillance. However, if she were to have signs/symptoms of bleeding, then we would pursue an EGD.

## 2016-06-22 NOTE — Progress Notes (Signed)
cc'ed to pcp °

## 2016-06-24 NOTE — Telephone Encounter (Signed)
Pt is aware.  

## 2016-06-25 ENCOUNTER — Encounter: Payer: Self-pay | Admitting: "Endocrinology

## 2016-06-25 LAB — HEMOGLOBIN A1C: Hemoglobin A1C: 8.1

## 2016-06-26 ENCOUNTER — Other Ambulatory Visit: Payer: Self-pay | Admitting: "Endocrinology

## 2016-07-07 ENCOUNTER — Encounter: Payer: Self-pay | Admitting: "Endocrinology

## 2016-07-07 ENCOUNTER — Ambulatory Visit (INDEPENDENT_AMBULATORY_CARE_PROVIDER_SITE_OTHER): Payer: BLUE CROSS/BLUE SHIELD | Admitting: "Endocrinology

## 2016-07-07 VITALS — BP 121/79 | HR 85 | Ht 61.5 in | Wt 227.0 lb

## 2016-07-07 DIAGNOSIS — E1122 Type 2 diabetes mellitus with diabetic chronic kidney disease: Secondary | ICD-10-CM

## 2016-07-07 DIAGNOSIS — Z6841 Body Mass Index (BMI) 40.0 and over, adult: Secondary | ICD-10-CM

## 2016-07-07 DIAGNOSIS — E038 Other specified hypothyroidism: Secondary | ICD-10-CM

## 2016-07-07 DIAGNOSIS — I10 Essential (primary) hypertension: Secondary | ICD-10-CM | POA: Diagnosis not present

## 2016-07-07 DIAGNOSIS — N183 Chronic kidney disease, stage 3 unspecified: Secondary | ICD-10-CM

## 2016-07-07 MED ORDER — INSULIN REGULAR HUMAN (CONC) 500 UNIT/ML ~~LOC~~ SOPN: PEN_INJECTOR | SUBCUTANEOUS | 2 refills | Status: DC

## 2016-07-07 NOTE — Patient Instructions (Signed)

## 2016-07-07 NOTE — Progress Notes (Signed)
Subjective:    Patient ID: Penny Martinez, female    DOB: 02/03/1951,    Past Medical History:  Diagnosis Date  . Acid reflux   . C. difficile colitis June/July 2015  . CHF (congestive heart failure) ([place])   . 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 Negative viral markers 2015  . COPD (chronic obstructive pulmonary disease) ([place])   . Diabetes mellitus without complication ([place])   . Hypercholesteremia   . Hypertension   . IBS (irritable bowel syndrome)    Past Surgical History:  Procedure Laterality Date  . ABDOMINAL HYSTERECTOMY    . APPENDECTOMY    . 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 CHOLECYSTECTOMY    . COLONOSCOPY N/A 02/19/2014   Dr. Gala Romney: rectal varices, rectal polyp overlying a varix non-manipulated  . ESOPHAGOGASTRODUODENOSCOPY N/A 02/19/2014   Dr. Gala Romney: esophageal varices, multiple gastric polyps with largest biopsied and hyperplastic. Negative H.pylori  . GIVENS CAPSULE STUDY N/A 05/04/2014   multiple polypoid-like lesions throughout small bowel, scattered superficial non-bleeding erosions more distal bowel, referred to [facility] for enteroscopy. Capsule study not complete, poor images.   . urosepsis     Social History   Social History  . Marital status: Married    Spouse name: N/A  . Number of children: N/A  . Years of education: N/A   Social History Main Topics  . Smoking status: Former Smoker    Packs/day: 2.00    Years: 35.00    Quit date: 05/05/2003  . Smokeless tobacco: Former Systems developer     Comment: Quit smoking 11 years ago  . Alcohol use No  . Drug use: No  . Sexual activity: Not Currently    Birth control/ protection: Surgical   Other Topics Concern  . None   Social History Narrative  . None   Outpatient Encounter Prescriptions as of 07/07/2016  Medication Sig  . amitriptyline (ELAVIL) 25 MG tablet Take 25 mg by mouth at bedtime.  Marland Kitchen aspirin 81 MG tablet Take 81 mg by mouth daily.  . baclofen (LIORESAL) 10 MG tablet Take  10 mg by mouth 3 (three) times daily.  . benazepril (LOTENSIN) 20 MG tablet Take 20 mg by mouth daily.  Marland Kitchen CRANBERRY-VITAMIN C PO Take 2 capsules by mouth at bedtime. Dosage is the 25,200 mg cranberry/vitamin C supplement  . diphenhydrAMINE (SOMINEX) 25 MG tablet Take by mouth at bedtime. Reported on 01/31/2016  . diphenoxylate-atropine (LOMOTIL) 2.5-0.025 MG tablet 1-2 tablets every 4-6 hours as needed for diarrhea.  . ezetimibe (ZETIA) 10 MG tablet Take 10 mg by mouth daily.  . furosemide (LASIX) 80 MG tablet Take 80 mg by mouth daily.  Marland Kitchen gabapentin (NEURONTIN) 100 MG capsule Take 1 capsule (100 mg total) by mouth 3 (three) times daily.  . insulin regular human CONCENTRATED (HUMULIN R U-500 KWIKPEN) 500 UNIT/ML kwikpen Inject 130 units subcutaneously before breakfast, lunch, and supper when blood glucose pre-meal is greater than 90 mg/dL.  Marland Kitchen lansoprazole (PREVACID) 30 MG capsule Take 30 mg by mouth daily.  Marland Kitchen levothyroxine (SYNTHROID, LEVOTHROID) 88 MCG tablet TAKE 1 TABLET ONCE A DAY BEFORE BREAKFAST  . Multiple Vitamins-Minerals (CENTRUM SILVER PO) Take 1 tablet by mouth daily.   . nitrofurantoin, macrocrystal-monohydrate, (MACROBID) 100 MG capsule Take 100 mg by mouth at bedtime.  . potassium chloride (K-DUR) 10 MEQ tablet Take 10 mEq by mouth daily.  . propranolol (INDERAL) 10 MG tablet Take 2 tablets (20 mg total) by  mouth daily.  Marland Kitchen ULTICARE SHORT PEN NEEDLES 31G X 8 MM MISC USE AS DIRECTED THREE TIMES DAILY.  Marland Kitchen VICTOZA 18 MG/3ML SOPN INJECT 1.8 MG S.Q. ONCE DAILY.  . [DISCONTINUED] HUMULIN R U-500 KWIKPEN 500 UNIT/ML kwikpen INJECT 130 UNITS S.Q. THREE TIMES DAILY WITH MEALS.   No facility-administered encounter medications on file as of 07/07/2016.    ALLERGIES: Allergies  Allergen Reactions  . Niacin Rash and Shortness Of Breath  . Penicillins Shortness Of Breath  . Erythromycin Hives  . Statins     Other reaction(s): Muscle Pain  . Sulfa Antibiotics    VACCINATION  STATUS:  There is no immunization history on file for this patient.  Diabetes  She presents for her follow-up diabetic visit. She has type 2 diabetes mellitus. Onset time: She was diagnosed at approximate age of 61 years. Her disease course has been worsening. There are no hypoglycemic associated symptoms. Pertinent negatives for hypoglycemia include no confusion, headaches, pallor or seizures. Associated symptoms include fatigue, polydipsia and polyuria. Pertinent negatives for diabetes include no chest pain and no polyphagia. There are no hypoglycemic complications. Symptoms are worsening. Diabetic complications include heart disease, nephropathy and PVD. Risk factors for coronary artery disease include diabetes mellitus, dyslipidemia, obesity, hypertension, sedentary lifestyle and tobacco exposure. Current diabetic treatment includes intensive insulin program. She is compliant with treatment most of the time. Her weight is increasing steadily. She is following a generally unhealthy diet. Prior visit with dietitian: She declined referral to a dietitian. She never participates in exercise. Her breakfast blood glucose range is generally 180-200 mg/dl. Her lunch blood glucose range is generally 180-200 mg/dl. Her dinner blood glucose range is generally 180-200 mg/dl. Her overall blood glucose range is 180-200 mg/dl. An ACE inhibitor/angiotensin II receptor blocker is being taken. Eye exam is current.  Hypertension  This is a chronic problem. The current episode started more than 1 year ago. The problem is controlled. Pertinent negatives include no chest pain, headaches, palpitations or shortness of breath. Risk factors for coronary artery disease include diabetes mellitus, dyslipidemia, obesity and sedentary lifestyle. Hypertensive end-organ damage includes PVD.  Hyperlipidemia  This is a chronic problem. The current episode started more than 1 year ago. Pertinent negatives include no chest pain, myalgias or  shortness of breath. Risk factors for coronary artery disease include diabetes mellitus, dyslipidemia, hypertension, a sedentary lifestyle and obesity.     Review of Systems  Constitutional: Positive for fatigue. Negative for unexpected weight change.  HENT: Negative for trouble swallowing and voice change.   Eyes: Negative for visual disturbance.  Respiratory: Negative for cough, shortness of breath and wheezing.   Cardiovascular: Negative for chest pain, palpitations and leg swelling.  Gastrointestinal: Negative for diarrhea, nausea and vomiting.  Endocrine: Positive for polydipsia and polyuria. Negative for cold intolerance, heat intolerance and polyphagia.  Musculoskeletal: Negative for arthralgias and myalgias.  Skin: Negative for color change, pallor, rash and wound.  Neurological: Negative for seizures and headaches.  Psychiatric/Behavioral: Negative for confusion and suicidal ideas.    Objective:    BP 121/79   Pulse 85   Ht 5' 1.5" (1.562 m)   Wt 227 lb (103 kg)   BMI 42.20 kg/m   Wt Readings from Last 3 Encounters:  07/07/16 227 lb (103 kg)  06/16/16 227 lb 3.2 oz (103.1 kg)  05/18/16 226 lb 4.8 oz (102.6 kg)    Physical Exam  Constitutional: She is oriented to person, place, and time. She appears well-developed.  HENT:  Head: Normocephalic and atraumatic.  Eyes: EOM are normal.  Neck: Normal range of motion. Neck supple. No tracheal deviation present. No thyromegaly present.  Cardiovascular: Normal rate and regular rhythm.   Pulmonary/Chest: Effort normal and breath sounds normal.  Abdominal: Soft. Bowel sounds are normal. There is no tenderness. There is no guarding.  Musculoskeletal: Normal range of motion. She exhibits no edema.  Neurological: She is alert and oriented to person, place, and time. She has normal reflexes. No cranial nerve deficit. Coordination normal.  Skin: Skin is warm and dry. No rash noted. No erythema. No pallor.  Psychiatric: She has a  normal mood and affect. Judgment normal.    Results for orders placed or performed in visit on 07/07/16  Hemoglobin A1c  Result Value Ref Range   Hemoglobin A1C 8.1    Complete Blood Count (Most recent): Lab Results  Component Value Date   WBC 4.4 05/18/2016   HGB 12.2 05/18/2016   HCT 38.0 05/18/2016   MCV 94.3 05/18/2016   PLT 74 (L) 05/18/2016    Diabetic Labs (most recent): Lab Results  Component Value Date   HGBA1C 8.1 06/25/2016   HGBA1C 6.9 03/23/2016   HGBA1C 7.6 (H) 12/18/2015     Assessment & Plan:   1. Uncontrolled type 2 diabetes mellitus with complication, with long-term current use of insulin ([place])  Her diabetes is  complicated by chronic kidney disease and peripheral arterial disease as well as CHF.  patient remains at a high risk for more acute and chronic complications of diabetes which include CAD, CVA, CKD, retinopathy, and neuropathy. These are all discussed in detail with the patient.  Patient came with Above target glucose profile and A1c of   8.1% increasing from 6.9%. - This is mainly driven by her omission off insulin for several days for the last 2 months.   Glucose logs and insulin administration records pertaining to this visit,  to be scanned into patient's records.  Recent labs reviewed.   - I have re-counseled the patient on diet management and weight loss  by adopting a carbohydrate restricted / protein rich  Diet.  - Suggestion is made for patient to avoid simple carbohydrates   from their diet including Cakes , Desserts, Ice Cream,  Soda (  diet and regular) , Sweet Tea , Candies,  Chips, Cookies, Artificial Sweeteners,   and "Sugar-free" Products .  This will help patient to have stable blood glucose profile and potentially avoid unintended  Weight gain.  - Patient is advised to stick to a routine mealtimes to eat 3 meals  a day and avoid unnecessary snacks ( to snack only to correct hypoglycemia).   - I have approached patient with the  following individualized plan to manage diabetes and patient agrees.  - She will continue to need U500 insulin. -I will  increase humulin  U500 to 130 units with breakfast, 130 units with lunch, and 130 units with supper for pre-meal glucose profile above 90 mg/dL associated with monitoring of blood glucose before meals and at bedtime. -She will continue Victoza 1.8 mg.  -Patient is encouraged to call clinic for blood glucose levels less than 70 or above 300 mg /dl.  -Advised to avoid unnecessary evening snacks.   -she is not interested  to explore option of bariatric surgery. -She declined referral to the dietitian.  -Patient is not a candidate for metformin andSGLT2 inhibitors due to CKD.   - Patient specific target  for A1c; LDL, HDL, Triglycerides,  and  Waist Circumference were discussed in detail.  2) BP/HTN:  controlled. Continue current medications including ACEI/ARB. 3) Lipids/HPL:  continue statins. 4)  Weight/Diet:  she decided not to consult with a dietitian, exercise, and carbohydrates information provided.  4) Primary hypothyroidism She is euthyroid. Heart thyroid function tests are consistent with appropriate replacement. I advised her to increase levothyroxine to 88g by mouth every morning.  - We discussed about correct intake of levothyroxine, at fasting, with water, separated by at least 30 minutes from breakfast, and separated by more than 4 hours from calcium, iron, multivitamins, acid reflux medications (PPIs). -Patient is made aware of the fact that thyroid hormone replacement is needed for life, dose to be adjusted by periodic monitoring of thyroid function tests.  6) Chronic Care/Health Maintenance:  -Patient is  on ACEI/ARB and Statin medications and encouraged to continue to follow up with Ophthalmology, Podiatrist at least yearly or according to recommendations, and advised to  stay away from smoking. I have recommended yearly flu vaccine and pneumonia  vaccination at least every 5 years; moderate intensity exercise for up to 150 minutes weekly; and  sleep for at least 7 hours a day.  I advised patient to maintain close follow up with her PCP for primary care needs.  Patient is asked to bring meter and  blood glucose logs during their next visit.   Follow up plan: Return in about 3 months (around 10/07/2016) for meter, and logs.  Glade Lloyd, MD Phone: [phone]  Fax: [phone]   07/07/2016, 11:30 AM

## 2016-07-13 ENCOUNTER — Encounter (HOSPITAL_COMMUNITY): Payer: Medicare Other | Attending: Hematology & Oncology

## 2016-07-13 ENCOUNTER — Other Ambulatory Visit (HOSPITAL_COMMUNITY): Payer: Self-pay | Admitting: Hematology & Oncology

## 2016-07-13 DIAGNOSIS — D500 Iron deficiency anemia secondary to blood loss (chronic): Secondary | ICD-10-CM | POA: Diagnosis present

## 2016-07-13 LAB — CBC WITH DIFFERENTIAL/PLATELET
BASOS PCT: 0 %
Basophils Absolute: 0 10*3/uL (ref 0.0–0.1)
EOS ABS: 0.1 10*3/uL (ref 0.0–0.7)
Eosinophils Relative: 2 %
HCT: 38.9 % (ref 36.0–46.0)
Hemoglobin: 12.5 g/dL (ref 12.0–15.0)
LYMPHS ABS: 1.2 10*3/uL (ref 0.7–4.0)
Lymphocytes Relative: 26 %
MCH: 29.9 pg (ref 26.0–34.0)
MCHC: 32.1 g/dL (ref 30.0–36.0)
MCV: 93.1 fL (ref 78.0–100.0)
MONO ABS: 0.4 10*3/uL (ref 0.1–1.0)
Monocytes Relative: 8 %
NEUTROS ABS: 2.9 10*3/uL (ref 1.7–7.7)
Neutrophils Relative %: 64 %
PLATELETS: 91 10*3/uL — AB (ref 150–400)
RBC: 4.18 MIL/uL (ref 3.87–5.11)
RDW: 14.6 % (ref 11.5–15.5)
WBC: 4.5 10*3/uL (ref 4.0–10.5)

## 2016-07-13 LAB — FERRITIN: FERRITIN: 66 ng/mL (ref 11–307)

## 2016-07-16 ENCOUNTER — Other Ambulatory Visit: Payer: Self-pay | Admitting: "Endocrinology

## 2016-07-24 ENCOUNTER — Encounter (HOSPITAL_BASED_OUTPATIENT_CLINIC_OR_DEPARTMENT_OTHER): Payer: Medicare Other

## 2016-07-24 VITALS — BP 102/53 | HR 81 | Temp 98.6°F | Resp 16

## 2016-07-24 DIAGNOSIS — D500 Iron deficiency anemia secondary to blood loss (chronic): Secondary | ICD-10-CM

## 2016-07-24 DIAGNOSIS — Z23 Encounter for immunization: Secondary | ICD-10-CM | POA: Diagnosis not present

## 2016-07-24 MED ORDER — SODIUM CHLORIDE 0.9 % IV SOLN
INTRAVENOUS | Status: DC
  Administered 2016-07-24: 14:00:00 via INTRAVENOUS

## 2016-07-24 MED ORDER — SODIUM CHLORIDE 0.9 % IV SOLN
510.0000 mg | Freq: Once | INTRAVENOUS | Status: AC
  Administered 2016-07-24: 510 mg via INTRAVENOUS
  Filled 2016-07-24: qty 17

## 2016-07-24 MED ORDER — INFLUENZA VAC SPLIT QUAD 0.5 ML IM SUSY
0.5000 mL | PREFILLED_SYRINGE | Freq: Once | INTRAMUSCULAR | Status: AC
  Administered 2016-07-24: 0.5 mL via INTRAMUSCULAR
  Filled 2016-07-24: qty 0.5

## 2016-07-26 ENCOUNTER — Encounter (HOSPITAL_COMMUNITY): Payer: Self-pay

## 2016-07-26 NOTE — Progress Notes (Signed)
Patient received Feraheme today as ordered. Patient tolerated well, no problems. Vitals stable and discharged home from clinic ambulatory.Follow up as scheduled.

## 2016-07-26 NOTE — Patient Instructions (Signed)
[place] at [facility] Discharge Instructions  RECOMMENDATIONS MADE BY THE CONSULTANT AND ANY TEST RESULTS WILL BE SENT TO YOUR REFERRING PHYSICIAN.  Feraheme given today. Flu shot given today. Follow up as scheduled  Thank you for choosing [place] at [facility] to provide your oncology and hematology care.  To afford each patient quality time with our provider, please arrive at least 15 minutes before your scheduled appointment time.   Beginning January 23rd 2017 lab work for the [facility] will be done in the  Main lab at [facility] on 1st floor. If you have a lab appointment with the [place] please come in thru the  Main Entrance and check in at the main information desk  You need to re-schedule your appointment should you arrive 10 or more minutes late.  We strive to give you quality time with our providers, and arriving late affects you and other patients whose appointments are after yours.  Also, if you no show three or more times for appointments you may be dismissed from the clinic at the providers discretion.     Again, thank you for choosing [facility].  Our hope is that these requests will decrease the amount of time that you wait before being seen by our physicians.       _____________________________________________________________  Should you have questions after your visit to [facility], please contact our office at [phone] between the hours of 8:30 a.m. and 4:30 p.m.  Voicemails left after 4:30 p.m. will not be returned until the following business day.  For prescription refill requests, have your pharmacy contact our office.         Resources For Cancer Patients and their Caregivers ? American Cancer Society: Can assist with transportation, wigs, general needs, runs Look Good Feel Better.        312-768-4808 ? Cancer Care: Provides financial assistance, online support  groups, medication/co-pay assistance.  1-800-813-HOPE (289) 386-5196) ? [place] Assists [place] cancer patients and their families through emotional , educational and financial support.  660-075-7649 ? [place] DSS Where to apply for food stamps, Medicaid and utility assistance. 443-007-1231 ? [facility]: Transportation to medical appointments. (337) 099-8280 ? Social Security Administration: May apply for disability if have a Stage IV cancer. (737)299-4568 662-413-3672 ? [employer], Disability and Transit Services: Assists with nutrition, care and transit needs. [place] Support Programs: @10RELATIVEDAYS @ > Cancer Support Group  2nd Tuesday of the month 1pm-2pm, Journey Room  > Creative Journey  3rd Tuesday of the month 1130am-1pm, Journey Room  > Look Good Feel Better  1st Wednesday of the month 10am-12 noon, Journey Room (Call [place] to register [phone])

## 2016-09-08 ENCOUNTER — Other Ambulatory Visit (HOSPITAL_COMMUNITY): Payer: BLUE CROSS/BLUE SHIELD

## 2016-09-11 ENCOUNTER — Encounter (HOSPITAL_COMMUNITY): Payer: Medicare Other | Attending: Hematology & Oncology

## 2016-09-11 DIAGNOSIS — D500 Iron deficiency anemia secondary to blood loss (chronic): Secondary | ICD-10-CM | POA: Insufficient documentation

## 2016-09-11 LAB — CBC WITH DIFFERENTIAL/PLATELET
Basophils Absolute: 0 10*3/uL (ref 0.0–0.1)
Basophils Relative: 0 %
Eosinophils Absolute: 0.1 10*3/uL (ref 0.0–0.7)
Eosinophils Relative: 2 %
HCT: 41.1 % (ref 36.0–46.0)
Hemoglobin: 13.2 g/dL (ref 12.0–15.0)
Lymphocytes Relative: 27 %
Lymphs Abs: 1.2 10*3/uL (ref 0.7–4.0)
MCH: 30.8 pg (ref 26.0–34.0)
MCHC: 32.1 g/dL (ref 30.0–36.0)
MCV: 95.8 fL (ref 78.0–100.0)
Monocytes Absolute: 0.4 10*3/uL (ref 0.1–1.0)
Monocytes Relative: 10 %
Neutro Abs: 2.7 10*3/uL (ref 1.7–7.7)
Neutrophils Relative %: 61 %
Platelets: 76 10*3/uL — ABNORMAL LOW (ref 150–400)
RBC: 4.29 MIL/uL (ref 3.87–5.11)
RDW: 15.6 % — ABNORMAL HIGH (ref 11.5–15.5)
WBC: 4.5 10*3/uL (ref 4.0–10.5)

## 2016-09-11 LAB — FERRITIN: Ferritin: 137 ng/mL (ref 11–307)

## 2016-09-15 ENCOUNTER — Other Ambulatory Visit: Payer: Self-pay | Admitting: "Endocrinology

## 2016-09-30 ENCOUNTER — Encounter: Payer: Self-pay | Admitting: Orthopaedic Surgery

## 2016-09-30 ENCOUNTER — Other Ambulatory Visit (HOSPITAL_COMMUNITY)
Admission: RE | Admit: 2016-09-30 | Discharge: 2016-09-30 | Disposition: A | Discharge status: Home / Self Care | Payer: Medicare Other | Source: Ambulatory Visit | Attending: "Endocrinology | Admitting: "Endocrinology

## 2016-09-30 ENCOUNTER — Ambulatory Visit (INDEPENDENT_AMBULATORY_CARE_PROVIDER_SITE_OTHER): Payer: Medicare Other | Admitting: Orthopaedic Surgery

## 2016-09-30 VITALS — BP 125/72 | HR 84 | Temp 97.7°F | Ht 61.0 in | Wt 225.0 lb

## 2016-09-30 DIAGNOSIS — N183 Chronic kidney disease, stage 3 (moderate): Secondary | ICD-10-CM | POA: Insufficient documentation

## 2016-09-30 DIAGNOSIS — E1122 Type 2 diabetes mellitus with diabetic chronic kidney disease: Secondary | ICD-10-CM | POA: Insufficient documentation

## 2016-09-30 DIAGNOSIS — M6530 Trigger finger, unspecified finger: Secondary | ICD-10-CM

## 2016-09-30 DIAGNOSIS — G5602 Carpal tunnel syndrome, left upper limb: Secondary | ICD-10-CM | POA: Diagnosis not present

## 2016-09-30 LAB — COMPREHENSIVE METABOLIC PANEL
ALK PHOS: 94 U/L (ref 38–126)
ALT: 30 U/L (ref 14–54)
AST: 54 U/L — ABNORMAL HIGH (ref 15–41)
Albumin: 3.7 g/dL (ref 3.5–5.0)
Anion gap: 9 (ref 5–15)
BILIRUBIN TOTAL: 0.7 mg/dL (ref 0.3–1.2)
BUN: 33 mg/dL — ABNORMAL HIGH (ref 6–20)
CALCIUM: 9.5 mg/dL (ref 8.9–10.3)
CO2: 29 mmol/L (ref 22–32)
CREATININE: 1.21 mg/dL — AB (ref 0.44–1.00)
Chloride: 99 mmol/L — ABNORMAL LOW (ref 101–111)
GFR, EST AFRICAN AMERICAN: 53 mL/min — AB (ref >60)
GFR, EST NON AFRICAN AMERICAN: 46 mL/min — AB (ref >60)
Glucose, Bld: 96 mg/dL (ref 65–99)
Potassium: 4 mmol/L (ref 3.5–5.1)
Sodium: 137 mmol/L (ref 135–145)
TOTAL PROTEIN: 7.6 g/dL (ref 6.5–8.1)

## 2016-09-30 NOTE — Progress Notes (Signed)
Subjective:    Patient ID: Penny Martinez, female    DOB: 1950-10-23, 66 y.o.   MRN: [id number]  HPI She has had locking of the left long finger for several months.  It is getting worse. She has no trauma, no redness.  She has had carpal tunnel surgery on the right several years ago and surgery was delayed on the left.  Her carpal tunnel symptoms are worse on the left now as well.  She has tried ice, rubs and heat and Tylenol with minimal help.  She also has some slight locking of the right thumb as well. She is right handed.  She has some swelling of the MCP joints first thing in the mornings.   Review of Systems  HENT: Negative for congestion.   Respiratory: Positive for shortness of breath. Negative for cough.   Cardiovascular: Negative for chest pain and leg swelling.  Endocrine: Positive for cold intolerance.  Musculoskeletal: Positive for arthralgias and joint swelling.  Allergic/Immunologic: Positive for environmental allergies.   Past Medical History:  Diagnosis Date  . Acid reflux   . C. difficile colitis June/July 2015  . Cancer ([place])   . CHF (congestive heart failure) ([place])   . 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 Negative viral markers 2015  . Complication of anesthesia   . COPD (chronic obstructive pulmonary disease) ([place])   . Diabetes mellitus without complication ([place])   . GERD (gastroesophageal reflux disease)   . Hypercholesteremia   . Hypertension   . IBS (irritable bowel syndrome)   . Kidney disease   . Liver disease   . Thyroid disease     Past Surgical History:  Procedure Laterality Date  . ABDOMINAL HYSTERECTOMY    . APPENDECTOMY    . 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 CARPAL TUNNEL RELEASE    . CATARACT EXTRACTION    . CHOLECYSTECTOMY    . COLONOSCOPY N/A 02/19/2014   Dr. Gala Romney: rectal varices, rectal polyp overlying a varix non-manipulated  . ESOPHAGOGASTRODUODENOSCOPY N/A 02/19/2014   Dr. Gala Romney: esophageal varices,  multiple gastric polyps with largest biopsied and hyperplastic. Negative H.pylori  . GIVENS CAPSULE STUDY N/A 05/04/2014   multiple polypoid-like lesions throughout small bowel, scattered superficial non-bleeding erosions more distal bowel, referred to [facility] for enteroscopy. Capsule study not complete, poor images.   . urosepsis      Current Outpatient Prescriptions on File Prior to Visit  Medication Sig Dispense Refill  . amitriptyline (ELAVIL) 25 MG tablet Take 25 mg by mouth at bedtime.    Marland Kitchen aspirin 81 MG tablet Take 81 mg by mouth daily.    . baclofen (LIORESAL) 10 MG tablet Take 10 mg by mouth 3 (three) times daily.    . benazepril (LOTENSIN) 20 MG tablet Take 20 mg by mouth daily.    Marland Kitchen CRANBERRY-VITAMIN C PO Take 2 capsules by mouth at bedtime. Dosage is the 25,200 mg cranberry/vitamin C supplement    . diphenhydrAMINE (SOMINEX) 25 MG tablet Take by mouth at bedtime. Reported on 01/31/2016    . diphenoxylate-atropine (LOMOTIL) 2.5-0.025 MG tablet 1-2 tablets every 4-6 hours as needed for diarrhea. 120 tablet 3  . ezetimibe (ZETIA) 10 MG tablet Take 10 mg by mouth daily.    . furosemide (LASIX) 80 MG tablet Take 80 mg by mouth daily.    Marland Kitchen gabapentin (NEURONTIN) 100 MG capsule Take 1 capsule (100 mg total) by mouth 3 (three) times daily. 90 capsule 3  .  HUMULIN R U-500 KWIKPEN 500 UNIT/ML kwikpen INJECT 130 UNITS S.Q. THREE TIMES DAILY WITH MEALS. 18 mL 2  . lansoprazole (PREVACID) 30 MG capsule Take 30 mg by mouth daily.    Marland Kitchen levothyroxine (SYNTHROID, LEVOTHROID) 88 MCG tablet TAKE 1 TABLET BY MOUTH ONCE DAILY. 30 tablet 4  . Multiple Vitamins-Minerals (CENTRUM SILVER PO) Take 1 tablet by mouth daily.     . nitrofurantoin, macrocrystal-monohydrate, (MACROBID) 100 MG capsule Take 100 mg by mouth at bedtime.  1  . potassium chloride (K-DUR) 10 MEQ tablet Take 10 mEq by mouth daily.    . propranolol (INDERAL) 10 MG tablet Take 2 tablets (20 mg total) by mouth daily. 60 tablet 11  .  ULTICARE SHORT PEN NEEDLES 31G X 8 MM MISC USE AS DIRECTED THREE TIMES DAILY. 100 each 5  . VICTOZA 18 MG/3ML SOPN INJECT 1.8 MG S.Q. ONCE DAILY. 9 mL 2   No current facility-administered medications on file prior to visit.     Social History   Social History  . Marital status: Married    Spouse name: N/A  . Number of children: N/A  . Years of education: N/A   Occupational History  . Not on file.   Social History Main Topics  . Smoking status: Former Smoker    Packs/day: 2.00    Years: 35.00    Quit date: 05/05/2003  . Smokeless tobacco: Former Systems developer     Comment: Quit smoking 11 years ago  . Alcohol use No  . Drug use: No  . Sexual activity: Not Currently    Birth control/ protection: Surgical   Other Topics Concern  . Not on file   Social History Narrative  . No narrative on file    Family History  Problem Relation Age of Onset  . Diabetes Mother   . Hypertension Mother   . Kidney failure Mother   . Blindness Mother   . Aneurysm Father   . Colon cancer Neg Hx     BP 125/72   Pulse 84   Temp 97.7 F (36.5 C)   Ht 5' 1"  (1.549 m)   Wt 225 lb (102.1 kg)   BMI 42.51 kg/m      Objective:   Physical Exam  Constitutional: She is oriented to person, place, and time. She appears well-developed and well-nourished.  HENT:  Head: Normocephalic and atraumatic.  Eyes: Conjunctivae and EOM are normal. Pupils are equal, round, and reactive to light.  Neck: Normal range of motion. Neck supple.  Cardiovascular: Normal rate, regular rhythm and intact distal pulses.   Pulmonary/Chest: Effort normal.  Abdominal: Soft.  Musculoskeletal: She exhibits tenderness (Locking of the left long finger and the right thumb at the A1 pulley, no redness, NV intact, Positive Phalen and Tinel on the left, scar right wrist.).  Neurological: She is alert and oriented to person, place, and time. She displays normal reflexes. No cranial nerve deficit. She exhibits normal muscle tone.  Coordination normal.  Skin: Skin is warm and dry.  Psychiatric: She has a normal mood and affect. Her behavior is normal. Judgment and thought content normal.          Assessment & Plan:   Encounter Diagnoses  Name Primary?  . Trigger finger, acquired Yes  . Carpal tunnel syndrome on left    Procedure note: After permission from the patient and sterile prep, the A1 pulley area of the left long finger was injected by sterile technique with 1% Xylocaine and 1 cc  DepoMedrol 40 tolerated well.  I have told her surgery is the definitive treatment for this condition.  She wanted to wait on the right thumb injection.  I will see her in two weeks.  Call if any problem.  Precautions discussed.  Electronically Signed Sanjuana Kava, MD 1/24/20189:44 AM

## 2016-10-01 LAB — HEMOGLOBIN A1C
Hgb A1c MFr Bld: 7.5 % — ABNORMAL HIGH (ref 4.8–5.6)
Mean Plasma Glucose: 169 mg/dL

## 2016-10-08 ENCOUNTER — Ambulatory Visit: Payer: BLUE CROSS/BLUE SHIELD | Admitting: "Endocrinology

## 2016-10-09 ENCOUNTER — Encounter: Payer: Self-pay | Admitting: "Endocrinology

## 2016-10-09 ENCOUNTER — Ambulatory Visit (INDEPENDENT_AMBULATORY_CARE_PROVIDER_SITE_OTHER): Payer: Medicare Other | Admitting: "Endocrinology

## 2016-10-09 VITALS — BP 130/77 | HR 76 | Ht 61.0 in | Wt 226.0 lb

## 2016-10-09 DIAGNOSIS — E1122 Type 2 diabetes mellitus with diabetic chronic kidney disease: Secondary | ICD-10-CM | POA: Diagnosis not present

## 2016-10-09 DIAGNOSIS — I10 Essential (primary) hypertension: Secondary | ICD-10-CM

## 2016-10-09 DIAGNOSIS — E038 Other specified hypothyroidism: Secondary | ICD-10-CM | POA: Diagnosis not present

## 2016-10-09 DIAGNOSIS — N183 Chronic kidney disease, stage 3 unspecified: Secondary | ICD-10-CM

## 2016-10-09 DIAGNOSIS — Z6841 Body Mass Index (BMI) 40.0 and over, adult: Secondary | ICD-10-CM | POA: Diagnosis not present

## 2016-10-09 MED ORDER — LEVOTHYROXINE SODIUM 100 MCG PO TABS: 100.0000 ug | ORAL_TABLET | Freq: Every day | ORAL | 1 refills | Status: DC

## 2016-10-09 MED ORDER — DEXAMETHASONE 1 MG PO TABS: 1.0000 mg | ORAL_TABLET | Freq: Once | ORAL | 0 refills | Status: AC

## 2016-10-09 NOTE — Patient Instructions (Signed)

## 2016-10-09 NOTE — Progress Notes (Signed)
Subjective:    Patient ID: Penny Martinez, female    DOB: 1951/05/16,    Past Medical History:  Diagnosis Date  . Acid reflux   . C. difficile colitis June/July 2015  . Cancer ([place])   . CHF (congestive heart failure) ([place])   . 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 Negative viral markers 2015  . Complication of anesthesia   . COPD (chronic obstructive pulmonary disease) ([place])   . Diabetes mellitus without complication ([place])   . GERD (gastroesophageal reflux disease)   . Hypercholesteremia   . Hypertension   . IBS (irritable bowel syndrome)   . Kidney disease   . Liver disease   . Thyroid disease    Past Surgical History:  Procedure Laterality Date  . ABDOMINAL HYSTERECTOMY    . APPENDECTOMY    . 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 CARPAL TUNNEL RELEASE    . CATARACT EXTRACTION    . CHOLECYSTECTOMY    . COLONOSCOPY N/A 02/19/2014   Dr. Gala Romney: rectal varices, rectal polyp overlying a varix non-manipulated  . ESOPHAGOGASTRODUODENOSCOPY N/A 02/19/2014   Dr. Gala Romney: esophageal varices, multiple gastric polyps with largest biopsied and hyperplastic. Negative H.pylori  . GIVENS CAPSULE STUDY N/A 05/04/2014   multiple polypoid-like lesions throughout small bowel, scattered superficial non-bleeding erosions more distal bowel, referred to [facility] for enteroscopy. Capsule study not complete, poor images.   . urosepsis     Social History   Social History  . Marital status: Married    Spouse name: N/A  . Number of children: N/A  . Years of education: N/A   Social History Main Topics  . Smoking status: Former Smoker    Packs/day: 2.00    Years: 35.00    Quit date: 05/05/2003  . Smokeless tobacco: Former Systems developer     Comment: Quit smoking 11 years ago  . Alcohol use No  . Drug use: No  . Sexual activity: Not Currently    Birth control/ protection: Surgical   Other Topics Concern  . None   Social History Narrative  . None   Outpatient Encounter  Prescriptions as of 10/09/2016  Medication Sig  . amitriptyline (ELAVIL) 25 MG tablet Take 25 mg by mouth at bedtime.  Marland Kitchen aspirin 81 MG tablet Take 81 mg by mouth daily.  . baclofen (LIORESAL) 10 MG tablet Take 10 mg by mouth 3 (three) times daily.  . benazepril (LOTENSIN) 20 MG tablet Take 20 mg by mouth daily.  Marland Kitchen CRANBERRY-VITAMIN C PO Take 2 capsules by mouth at bedtime. Dosage is the 25,200 mg cranberry/vitamin C supplement  . diphenhydrAMINE (SOMINEX) 25 MG tablet Take by mouth at bedtime. Reported on 01/31/2016  . diphenoxylate-atropine (LOMOTIL) 2.5-0.025 MG tablet 1-2 tablets every 4-6 hours as needed for diarrhea.  . ezetimibe (ZETIA) 10 MG tablet Take 10 mg by mouth daily.  . furosemide (LASIX) 80 MG tablet Take 80 mg by mouth daily.  Marland Kitchen gabapentin (NEURONTIN) 100 MG capsule Take 1 capsule (100 mg total) by mouth 3 (three) times daily.  Marland Kitchen HUMULIN R U-500 KWIKPEN 500 UNIT/ML kwikpen INJECT 130 UNITS S.Q. THREE TIMES DAILY WITH MEALS.  Marland Kitchen lansoprazole (PREVACID) 30 MG capsule Take 30 mg by mouth daily.  . Multiple Vitamins-Minerals (CENTRUM SILVER PO) Take 1 tablet by mouth daily.   . nitrofurantoin, macrocrystal-monohydrate, (MACROBID) 100 MG capsule Take 100 mg by mouth at bedtime.  . potassium chloride (K-DUR) 10 MEQ tablet Take 10 mEq  by mouth daily.  . propranolol (INDERAL) 10 MG tablet Take 2 tablets (20 mg total) by mouth daily.  Marland Kitchen ULTICARE SHORT PEN NEEDLES 31G X 8 MM MISC USE AS DIRECTED THREE TIMES DAILY.  Marland Kitchen VICTOZA 18 MG/3ML SOPN INJECT 1.8 MG S.Q. ONCE DAILY.  . [DISCONTINUED] levothyroxine (SYNTHROID, LEVOTHROID) 88 MCG tablet TAKE 1 TABLET BY MOUTH ONCE DAILY.   No facility-administered encounter medications on file as of 10/09/2016.    ALLERGIES: Allergies  Allergen Reactions  . Niacin Rash and Shortness Of Breath  . Penicillins Shortness Of Breath  . Erythromycin Hives  . Statins     Other reaction(s): Muscle Pain  . Sulfa Antibiotics    VACCINATION  STATUS: Immunization History  Administered Date(s) Administered  . Influenza,inj,Quad PF,36+ Mos 07/24/2016    Diabetes  She presents for her follow-up diabetic visit. She has type 2 diabetes mellitus. Onset time: She was diagnosed at approximate age of 27 years. Her disease course has been improving. There are no hypoglycemic associated symptoms. Pertinent negatives for hypoglycemia include no confusion, headaches, pallor or seizures. Associated symptoms include fatigue, polydipsia and polyuria. Pertinent negatives for diabetes include no chest pain and no polyphagia. There are no hypoglycemic complications. Symptoms are improving. Diabetic complications include heart disease, nephropathy and PVD. Risk factors for coronary artery disease include diabetes mellitus, dyslipidemia, obesity, hypertension, sedentary lifestyle and tobacco exposure. Current diabetic treatment includes intensive insulin program. She is compliant with treatment most of the time. Her weight is stable. She is following a generally unhealthy diet. Prior visit with dietitian: She declined referral to a dietitian. She never participates in exercise. Her breakfast blood glucose range is generally 140-180 mg/dl. Her lunch blood glucose range is generally 140-180 mg/dl. Her dinner blood glucose range is generally 180-200 mg/dl. Her overall blood glucose range is 180-200 mg/dl. An ACE inhibitor/angiotensin II receptor blocker is being taken. Eye exam is current.  Hypertension  This is a chronic problem. The current episode started more than 1 year ago. The problem is controlled. Pertinent negatives include no chest pain, headaches, palpitations or shortness of breath. Risk factors for coronary artery disease include diabetes mellitus, dyslipidemia, obesity and sedentary lifestyle. Hypertensive end-organ damage includes PVD.  Hyperlipidemia  This is a chronic problem. The current episode started more than 1 year ago. Pertinent negatives  include no chest pain, myalgias or shortness of breath. Risk factors for coronary artery disease include diabetes mellitus, dyslipidemia, hypertension, a sedentary lifestyle and obesity.     Review of Systems  Constitutional: Positive for fatigue. Negative for unexpected weight change.  HENT: Negative for trouble swallowing and voice change.   Eyes: Negative for visual disturbance.  Respiratory: Negative for cough, shortness of breath and wheezing.   Cardiovascular: Negative for chest pain, palpitations and leg swelling.  Gastrointestinal: Negative for diarrhea, nausea and vomiting.  Endocrine: Positive for polydipsia and polyuria. Negative for cold intolerance, heat intolerance and polyphagia.  Musculoskeletal: Negative for arthralgias and myalgias.  Skin: Negative for color change, pallor, rash and wound.  Neurological: Negative for seizures and headaches.  Psychiatric/Behavioral: Negative for confusion and suicidal ideas.    Objective:    BP 130/77   Pulse 76   Ht 5' 1"  (1.549 m)   Wt 226 lb (102.5 kg)   BMI 42.70 kg/m   Wt Readings from Last 3 Encounters:  10/09/16 226 lb (102.5 kg)  09/30/16 225 lb (102.1 kg)  07/07/16 227 lb (103 kg)    Physical Exam  Constitutional: She is  oriented to person, place, and time. She appears well-developed.  HENT:  Head: Normocephalic and atraumatic.  Eyes: EOM are normal.  Neck: Normal range of motion. Neck supple. No tracheal deviation present. No thyromegaly present.  Cardiovascular: Normal rate and regular rhythm.   Pulmonary/Chest: Effort normal and breath sounds normal.  Abdominal: Soft. Bowel sounds are normal. There is no tenderness. There is no guarding.  Musculoskeletal: Normal range of motion. She exhibits no edema.  Neurological: She is alert and oriented to person, place, and time. She has normal reflexes. No cranial nerve deficit. Coordination normal.  Skin: Skin is warm and dry. No rash noted. No erythema. No pallor.   Psychiatric: She has a normal mood and affect. Judgment normal.    Results for orders placed or performed during the hospital encounter of 09/30/16  Comprehensive metabolic panel  Result Value Ref Range   Sodium 137 135 - 145 mmol/L   Potassium 4.0 3.5 - 5.1 mmol/L   Chloride 99 (L) 101 - 111 mmol/L   CO2 29 22 - 32 mmol/L   Glucose, Bld 96 65 - 99 mg/dL   BUN 33 (H) 6 - 20 mg/dL   Creatinine, Ser 1.21 (H) 0.44 - 1.00 mg/dL   Calcium 9.5 8.9 - 10.3 mg/dL   Total Protein 7.6 6.5 - 8.1 g/dL   Albumin 3.7 3.5 - 5.0 g/dL   AST 54 (H) 15 - 41 U/L   ALT 30 14 - 54 U/L   Alkaline Phosphatase 94 38 - 126 U/L   Total Bilirubin 0.7 0.3 - 1.2 mg/dL   GFR calc non Af Amer 46 (L) >60 mL/min   GFR calc Af Amer 53 (L) >60 mL/min   Anion gap 9 5 - 15  Hemoglobin A1c  Result Value Ref Range   Hgb A1c MFr Bld 7.5 (H) 4.8 - 5.6 %   Mean Plasma Glucose 169 mg/dL   Complete Blood Count (Most recent): Lab Results  Component Value Date   WBC 4.5 09/11/2016   HGB 13.2 09/11/2016   HCT 41.1 09/11/2016   MCV 95.8 09/11/2016   PLT 76 (L) 09/11/2016    Diabetic Labs (most recent): Lab Results  Component Value Date   HGBA1C 7.5 (H) 09/30/2016   HGBA1C 8.1 06/25/2016   HGBA1C 6.9 03/23/2016     Assessment & Plan:   1. Uncontrolled type 2 diabetes mellitus with complication, with long-term current use of insulin ([place])  Her diabetes is  complicated by chronic kidney disease and peripheral arterial disease as well as CHF.  patient remains at a high risk for more acute and chronic complications of diabetes which include CAD, CVA, CKD, retinopathy, and neuropathy. These are all discussed in detail with the patient.  Patient came with Above target glucose profile and A1c of  7.5% improving from  8.1%.   Glucose logs and insulin administration records pertaining to this visit,  to be scanned into patient's records.  Recent labs reviewed.   - I have re-counseled the patient on diet management  and weight loss  by adopting a carbohydrate restricted / protein rich  Diet.  - Suggestion is made for patient to avoid simple carbohydrates   from their diet including Cakes , Desserts, Ice Cream,  Soda (  diet and regular) , Sweet Tea , Candies,  Chips, Cookies, Artificial Sweeteners,   and "Sugar-free" Products .  This will help patient to have stable blood glucose profile and potentially avoid unintended  Weight gain.  - Patient  is advised to stick to a routine mealtimes to eat 3 meals  a day and avoid unnecessary snacks ( to snack only to correct hypoglycemia).   - I have approached patient with the following individualized plan to manage diabetes and patient agrees.  - She will continue to need U500 insulin. -I will   humulin  U500 to 130 units with breakfast, 130 units with lunch, and 130 units with supper for pre-meal glucose profile above 90 mg/dL associated with monitoring of blood glucose before meals and at bedtime. -She will continue Victoza 1.8 mg.  -Patient is encouraged to call clinic for blood glucose levels less than 70 or above 300 mg /dl.  -Advised to avoid unnecessary evening snacks.  -she is not interested  to explore option of bariatric surgery. -She declined referral to the dietitian.  -Patient is not a candidate for metformin andSGLT2 inhibitors due to CKD. -   She is offered low dose dex supression test to screen endogenous steroid burden. - Patient specific target  for A1c; LDL, HDL, Triglycerides, and  Waist Circumference were discussed in detail.  2) BP/HTN:  controlled. Continue current medications including ACEI/ARB. 3) Lipids/HPL:  continue statins. 4)  Weight/Diet:  she decided not to consult with a dietitian, exercise, and carbohydrates information provided.  4) Primary hypothyroidism Her thyroid function tests are consistent with appropriate replacement, how ever she will benefit from a slight increase in her levothyroxine . I will  Prescribe 144mg po  qam.   - We discussed about correct intake of levothyroxine, at fasting, with water, separated by at least 30 minutes from breakfast, and separated by more than 4 hours from calcium, iron, multivitamins, acid reflux medications (PPIs). -Patient is made aware of the fact that thyroid hormone replacement is needed for life, dose to be adjusted by periodic monitoring of thyroid function tests.  6) Chronic Care/Health Maintenance:  -Patient is  on ACEI/ARB and Statin medications and encouraged to continue to follow up with Ophthalmology, Podiatrist at least yearly or according to recommendations, and advised to  stay away from smoking. I have recommended yearly flu vaccine and pneumonia vaccination at least every 5 years; moderate intensity exercise for up to 150 minutes weekly; and  sleep for at least 7 hours a day.  I advised patient to maintain close follow up with her PCP for primary care needs.  Patient is asked to bring meter and  blood glucose logs during their next visit.   Follow up plan: Return in about 3 months (around 01/06/2017) for meter, and logs.  GGlade Lloyd MD Phone: [phone] Fax: [phone]  10/09/2016, 10:06 AM

## 2016-10-13 ENCOUNTER — Other Ambulatory Visit: Payer: Self-pay

## 2016-10-14 ENCOUNTER — Encounter: Payer: Self-pay | Admitting: Orthopaedic Surgery

## 2016-10-14 ENCOUNTER — Ambulatory Visit (INDEPENDENT_AMBULATORY_CARE_PROVIDER_SITE_OTHER): Payer: Medicare Other | Admitting: Orthopaedic Surgery

## 2016-10-14 VITALS — BP 122/71 | HR 81 | Temp 97.9°F | Ht 61.0 in | Wt 224.0 lb

## 2016-10-14 DIAGNOSIS — G5602 Carpal tunnel syndrome, left upper limb: Secondary | ICD-10-CM | POA: Diagnosis not present

## 2016-10-14 DIAGNOSIS — M6530 Trigger finger, unspecified finger: Secondary | ICD-10-CM | POA: Diagnosis not present

## 2016-10-14 NOTE — Progress Notes (Signed)
Patient JQ:ZESPQZ Penny Martinez, female DOB:1950-10-25, 66 y.o. RAQ:[id number]  Chief Complaint  Patient presents with  . Follow-up    trigger fingers, left long, right thumb    HPI  Penny Martinez is a 66 y.o. female who has left carpal tunnel and trigger finger of the left long finger.  She has decided she will need surgery.  I will have her see Dr. Aline Brochure for this.  She is to have possible surgery on her neck end of this month and wants to delay any surgery on her hands pending that. HPI  Body mass index is 42.32 kg/m.  ROS  Review of Systems  HENT: Negative for congestion.   Respiratory: Positive for shortness of breath. Negative for cough.   Cardiovascular: Negative for chest pain and leg swelling.  Endocrine: Positive for cold intolerance.  Musculoskeletal: Positive for arthralgias and joint swelling.  Allergic/Immunologic: Positive for environmental allergies.    Past Medical History:  Diagnosis Date  . Acid reflux   . C. difficile colitis June/July 2015  . Cancer ([place])   . CHF (congestive heart failure) ([place])   . 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 Negative viral markers 2015  . Complication of anesthesia   . COPD (chronic obstructive pulmonary disease) ([place])   . Diabetes mellitus without complication ([place])   . GERD (gastroesophageal reflux disease)   . Hypercholesteremia   . Hypertension   . IBS (irritable bowel syndrome)   . Kidney disease   . Liver disease   . Thyroid disease     Past Surgical History:  Procedure Laterality Date  . ABDOMINAL HYSTERECTOMY    . APPENDECTOMY    . 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 CARPAL TUNNEL RELEASE    . CATARACT EXTRACTION    . CHOLECYSTECTOMY    . COLONOSCOPY N/A 02/19/2014   Dr. Gala Romney: rectal varices, rectal polyp overlying a varix non-manipulated  . ESOPHAGOGASTRODUODENOSCOPY N/A 02/19/2014   Dr. Gala Romney: esophageal varices, multiple gastric polyps with largest biopsied and hyperplastic.  Negative H.pylori  . GIVENS CAPSULE STUDY N/A 05/04/2014   multiple polypoid-like lesions throughout small bowel, scattered superficial non-bleeding erosions more distal bowel, referred to [facility] for enteroscopy. Capsule study not complete, poor images.   . urosepsis      Family History  Problem Relation Age of Onset  . Diabetes Mother   . Hypertension Mother   . Kidney failure Mother   . Blindness Mother   . Aneurysm Father   . Colon cancer Neg Hx     Social History Social History  Substance Use Topics  . Smoking status: Former Smoker    Packs/day: 2.00    Years: 35.00    Quit date: 05/05/2003  . Smokeless tobacco: Former Systems developer     Comment: Quit smoking 11 years ago  . Alcohol use No    Allergies  Allergen Reactions  . Niacin Rash and Shortness Of Breath  . Penicillins Shortness Of Breath  . Erythromycin Hives  . Statins     Other reaction(s): Muscle Pain  . Sulfa Antibiotics     Current Outpatient Prescriptions  Medication Sig Dispense Refill  . amitriptyline (ELAVIL) 25 MG tablet Take 25 mg by mouth at bedtime.    Marland Kitchen aspirin 81 MG tablet Take 81 mg by mouth daily.    . baclofen (LIORESAL) 10 MG tablet Take 10 mg by mouth 3 (three) times daily.    . benazepril (LOTENSIN) 20 MG tablet Take  20 mg by mouth daily.    Marland Kitchen CRANBERRY-VITAMIN C PO Take 2 capsules by mouth at bedtime. Dosage is the 25,200 mg cranberry/vitamin C supplement    . diphenhydrAMINE (SOMINEX) 25 MG tablet Take by mouth at bedtime. Reported on 01/31/2016    . diphenoxylate-atropine (LOMOTIL) 2.5-0.025 MG tablet 1-2 tablets every 4-6 hours as needed for diarrhea. 120 tablet 3  . ezetimibe (ZETIA) 10 MG tablet Take 10 mg by mouth daily.    . furosemide (LASIX) 80 MG tablet Take 80 mg by mouth daily.    Marland Kitchen gabapentin (NEURONTIN) 100 MG capsule Take 1 capsule (100 mg total) by mouth 3 (three) times daily. 90 capsule 3  . HUMULIN R U-500 KWIKPEN 500 UNIT/ML kwikpen INJECT 130 UNITS S.Q. THREE TIMES DAILY WITH  MEALS. 18 mL 2  . lansoprazole (PREVACID) 30 MG capsule Take 30 mg by mouth daily.    Marland Kitchen levothyroxine (SYNTHROID, LEVOTHROID) 100 MCG tablet Take 1 tablet (100 mcg total) by mouth daily. 90 tablet 1  . Multiple Vitamins-Minerals (CENTRUM SILVER PO) Take 1 tablet by mouth daily.     . nitrofurantoin, macrocrystal-monohydrate, (MACROBID) 100 MG capsule Take 100 mg by mouth at bedtime.  1  . potassium chloride (K-DUR) 10 MEQ tablet Take 10 mEq by mouth daily.    . propranolol (INDERAL) 10 MG tablet Take 2 tablets (20 mg total) by mouth daily. 60 tablet 11  . ULTICARE SHORT PEN NEEDLES 31G X 8 MM MISC USE AS DIRECTED THREE TIMES DAILY. 100 each 5  . VICTOZA 18 MG/3ML SOPN INJECT 1.8 MG S.Q. ONCE DAILY. 9 mL 2   No current facility-administered medications for this visit.      Physical Exam  Blood pressure 122/71, pulse 81, temperature 97.9 F (36.6 C), height 5' 1"  (1.549 m), weight 224 lb (101.6 kg).  Constitutional: overall normal hygiene, normal nutrition, well developed, normal grooming, normal body habitus. Assistive device:none  Musculoskeletal: gait and station Limp none, muscle tone and strength are normal, no tremors or atrophy is present.  .  Neurological: coordination overall normal.  Deep tendon reflex/nerve stretch intact.  Sensation normal.  Cranial nerves II-XII intact.   Skin:   Normal overall no scars, lesions, ulcers or rashes. No psoriasis.  Psychiatric: Alert and oriented x 3.  Recent memory intact, remote memory unclear.  Normal mood and affect. Well groomed.  Good eye contact.  Cardiovascular: overall no swelling, no varicosities, no edema bilaterally, normal temperatures of the legs and arms, no clubbing, cyanosis and good capillary refill.  Lymphatic: palpation is normal.  The left hand has positive Phalan and Tinel signs and also decreased sensation of the median nerve.  The left long finger has some slight triggering.     The patient has been educated about  the nature of the problem(s) and counseled on treatment options.  The patient appeared to understand what I have discussed and is in agreement with it.  Encounter Diagnoses  Name Primary?  . Trigger finger, acquired Yes  . Carpal tunnel syndrome on left     PLAN Call if any problems.  Precautions discussed.  Continue current medications.   Return to clinic to see Dr. Aline Brochure for possible surgery   Electronically Signed Sanjuana Kava, MD 2/7/201810:49 AM

## 2016-10-15 MED ORDER — PROPRANOLOL HCL 10 MG PO TABS: 20.0000 mg | ORAL_TABLET | Freq: Every day | ORAL | 11 refills | Status: DC

## 2016-10-23 ENCOUNTER — Other Ambulatory Visit: Payer: Self-pay | Admitting: "Endocrinology

## 2016-10-27 ENCOUNTER — Other Ambulatory Visit: Payer: Self-pay | Admitting: Radiology

## 2016-10-27 DIAGNOSIS — M545 Low back pain: Secondary | ICD-10-CM

## 2016-10-27 DIAGNOSIS — M25551 Pain in right hip: Secondary | ICD-10-CM

## 2016-10-28 ENCOUNTER — Ambulatory Visit (INDEPENDENT_AMBULATORY_CARE_PROVIDER_SITE_OTHER): Payer: Medicare Other | Admitting: Orthopaedic Surgery

## 2016-10-28 ENCOUNTER — Ambulatory Visit (HOSPITAL_COMMUNITY)
Admission: RE | Admit: 2016-10-28 | Discharge: 2016-10-28 | Disposition: A | Discharge status: Home / Self Care | Payer: Medicare Other | Source: Ambulatory Visit | Attending: Orthopaedic Surgery | Admitting: Orthopaedic Surgery

## 2016-10-28 ENCOUNTER — Telehealth: Payer: Self-pay | Admitting: Orthopedic Surgery

## 2016-10-28 VITALS — BP 127/68 | HR 80 | Temp 97.9°F | Ht 61.0 in | Wt 226.0 lb

## 2016-10-28 DIAGNOSIS — M545 Low back pain: Secondary | ICD-10-CM

## 2016-10-28 DIAGNOSIS — X58XXXA Exposure to other specified factors, initial encounter: Secondary | ICD-10-CM | POA: Insufficient documentation

## 2016-10-28 DIAGNOSIS — I2510 Atherosclerotic heart disease of native coronary artery without angina pectoris: Secondary | ICD-10-CM | POA: Insufficient documentation

## 2016-10-28 DIAGNOSIS — I700 Atherosclerosis of aorta: Secondary | ICD-10-CM | POA: Diagnosis not present

## 2016-10-28 DIAGNOSIS — S22000A Wedge compression fracture of unspecified thoracic vertebra, initial encounter for closed fracture: Secondary | ICD-10-CM | POA: Diagnosis not present

## 2016-10-28 DIAGNOSIS — M25551 Pain in right hip: Secondary | ICD-10-CM | POA: Insufficient documentation

## 2016-10-28 DIAGNOSIS — M5441 Lumbago with sciatica, right side: Secondary | ICD-10-CM | POA: Diagnosis not present

## 2016-10-28 DIAGNOSIS — M5136 Other intervertebral disc degeneration, lumbar region: Secondary | ICD-10-CM | POA: Insufficient documentation

## 2016-10-28 MED ORDER — PREDNISONE 5 MG (21) PO TBPK: ORAL_TABLET | ORAL | 0 refills | Status: DC

## 2016-10-28 NOTE — Telephone Encounter (Signed)
It came to attention that a consult is needed to schedule patient of Dr Brooke Bonito to discuss surgery for hand, per office visit 10/14/16. Please advise.

## 2016-10-28 NOTE — Progress Notes (Signed)
Patient [name], female DOB:[birth year], 66 y.o. XVQ:[id number]  Chief Complaint  Patient presents with  . New Problem    Back pain    HPI  Penny Martinez is a 66 y.o. female who has begun to have pain of the lower back and pain running down the right leg.  She has no trauma.  She says this began about a week ago.  She has no weakness or bowel or bladder problems.  Rest, heat and ice have not helped.  She has taken Tylenol with little help. HPI  Body mass index is 42.7 kg/m.  ROS  Review of Systems  HENT: Negative for congestion.   Respiratory: Positive for shortness of breath. Negative for cough.   Cardiovascular: Negative for chest pain and leg swelling.  Endocrine: Positive for cold intolerance.  Musculoskeletal: Positive for arthralgias and joint swelling.  Allergic/Immunologic: Positive for environmental allergies.    Past Medical History:  Diagnosis Date  . Acid reflux   . C. difficile colitis June/July 2015  . Cancer ([place])   . CHF (congestive heart failure) ([place])   . 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 Negative viral markers 2015  . Complication of anesthesia   . COPD (chronic obstructive pulmonary disease) ([place])   . Diabetes mellitus without complication ([place])   . GERD (gastroesophageal reflux disease)   . Hypercholesteremia   . Hypertension   . IBS (irritable bowel syndrome)   . Kidney disease   . Liver disease   . Thyroid disease     Past Surgical History:  Procedure Laterality Date  . ABDOMINAL HYSTERECTOMY    . APPENDECTOMY    . 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 CARPAL TUNNEL RELEASE    . CATARACT EXTRACTION    . CHOLECYSTECTOMY    . COLONOSCOPY N/A 02/19/2014   Dr. Gala Romney: rectal varices, rectal polyp overlying a varix non-manipulated  . ESOPHAGOGASTRODUODENOSCOPY N/A 02/19/2014   Dr. Gala Romney: esophageal varices, multiple gastric polyps with largest biopsied and hyperplastic. Negative H.pylori  . GIVENS CAPSULE  STUDY N/A 05/04/2014   multiple polypoid-like lesions throughout small bowel, scattered superficial non-bleeding erosions more distal bowel, referred to [facility] for enteroscopy. Capsule study not complete, poor images.   . urosepsis      Family History  Problem Relation Age of Onset  . Diabetes Mother   . Hypertension Mother   . Kidney failure Mother   . Blindness Mother   . Aneurysm Father   . Colon cancer Neg Hx     Social History Social History  Substance Use Topics  . Smoking status: Former Smoker    Packs/day: 2.00    Years: 35.00    Quit date: 05/05/2003  . Smokeless tobacco: Former Systems developer     Comment: Quit smoking 11 years ago  . Alcohol use No    Allergies  Allergen Reactions  . Niacin Rash and Shortness Of Breath  . Penicillins Shortness Of Breath  . Erythromycin Hives  . Statins     Other reaction(s): Muscle Pain  . Sulfa Antibiotics     Current Outpatient Prescriptions  Medication Sig Dispense Refill  . amitriptyline (ELAVIL) 25 MG tablet Take 25 mg by mouth at bedtime.    Marland Kitchen aspirin 81 MG tablet Take 81 mg by mouth daily.    . baclofen (LIORESAL) 10 MG tablet Take 10 mg by mouth 3 (three) times daily.    . benazepril (LOTENSIN) 20 MG tablet Take 20 mg  by mouth daily.    Marland Kitchen CRANBERRY-VITAMIN C PO Take 2 capsules by mouth at bedtime. Dosage is the 25,200 mg cranberry/vitamin C supplement    . diphenhydrAMINE (SOMINEX) 25 MG tablet Take by mouth at bedtime. Reported on 01/31/2016    . diphenoxylate-atropine (LOMOTIL) 2.5-0.025 MG tablet 1-2 tablets every 4-6 hours as needed for diarrhea. 120 tablet 3  . ezetimibe (ZETIA) 10 MG tablet Take 10 mg by mouth daily.    . furosemide (LASIX) 80 MG tablet Take 80 mg by mouth daily.    Marland Kitchen gabapentin (NEURONTIN) 100 MG capsule TAKE (1) CAPSULE BY MOUTH THREE TIMES DAILY 90 capsule 2  . HUMULIN R U-500 KWIKPEN 500 UNIT/ML kwikpen INJECT 130 UNITS S.Q. THREE TIMES DAILY WITH MEALS. 18 mL 2  . lansoprazole (PREVACID) 30 MG  capsule Take 30 mg by mouth daily.    Marland Kitchen levothyroxine (SYNTHROID, LEVOTHROID) 100 MCG tablet Take 1 tablet (100 mcg total) by mouth daily. 90 tablet 1  . Multiple Vitamins-Minerals (CENTRUM SILVER PO) Take 1 tablet by mouth daily.     . nitrofurantoin, macrocrystal-monohydrate, (MACROBID) 100 MG capsule Take 100 mg by mouth at bedtime.  1  . potassium chloride (K-DUR) 10 MEQ tablet Take 10 mEq by mouth daily.    . propranolol (INDERAL) 10 MG tablet Take 2 tablets (20 mg total) by mouth daily. 60 tablet 11  . ULTICARE SHORT PEN NEEDLES 31G X 8 MM MISC USE AS DIRECTED THREE TIMES DAILY. 100 each 5  . VICTOZA 18 MG/3ML SOPN INJECT 1.8 MG S.Q. ONCE DAILY. 9 mL 2   No current facility-administered medications for this visit.      Physical Exam  Blood pressure 127/68, pulse 80, temperature 97.9 F (36.6 C), height 5' 1"  (1.549 m), weight 226 lb (102.5 kg).  Constitutional: overall normal hygiene, normal nutrition, well developed, normal grooming, normal body habitus. Assistive device:none  Musculoskeletal: gait and station Limp none, muscle tone and strength are normal, no tremors or atrophy is present.  .  Neurological: coordination overall normal.  Deep tendon reflex/nerve stretch intact.  Sensation normal.  Cranial nerves II-XII intact.   Skin:   Normal overall no scars, lesions, ulcers or rashes. No psoriasis.  Psychiatric: Alert and oriented x 3.  Recent memory intact, remote memory unclear.  Normal mood and affect. Well groomed.  Good eye contact.  Cardiovascular: overall no swelling, no varicosities, no edema bilaterally, normal temperatures of the legs and arms, no clubbing, cyanosis and good capillary refill.  Lymphatic: palpation is normal.  Spine/Pelvis examination:  Inspection:  Overall, sacoiliac joint benign and hips nontender; without crepitus or defects.   Thoracic spine inspection: Alignment normal without kyphosis present   Lumbar spine inspection:  Alignment  with  normal lumbar lordosis, without scoliosis apparent.   Thoracic spine palpation:  without tenderness of spinal processes   Lumbar spine palpation: with tenderness of lumbar area; without tightness of lumbar muscles    Range of Motion:   Lumbar flexion, forward flexion is 45 without pain or tenderness    Lumbar extension is 10 without pain or tenderness   Left lateral bend is Normal  without pain or tenderness   Right lateral bend is Normal without pain or tenderness   Straight leg raising is Normal   Strength & tone: Normal   Stability overall normal stability     The patient has been educated about the nature of the problem(s) and counseled on treatment options.  The patient appeared to understand what  I have discussed and is in agreement with it.  Encounter Diagnosis  Name Primary?  . Acute right-sided low back pain with right-sided sciatica Yes    PLAN Call if any problems.  Precautions discussed.  Continue current medications.   Return to clinic 3 weeks   Begin PT.  I will give prednisone dose pack.   Electronically Signed Sanjuana Kava, MD 2/21/20182:21 PM

## 2016-10-30 NOTE — Telephone Encounter (Signed)
WEDS [id number]

## 2016-11-02 ENCOUNTER — Ambulatory Visit (HOSPITAL_COMMUNITY): Payer: Medicare Other | Attending: Orthopaedic Surgery

## 2016-11-02 ENCOUNTER — Ambulatory Visit (HOSPITAL_COMMUNITY): Payer: BLUE CROSS/BLUE SHIELD | Admitting: Hematology & Oncology

## 2016-11-02 ENCOUNTER — Other Ambulatory Visit (HOSPITAL_COMMUNITY): Payer: BLUE CROSS/BLUE SHIELD

## 2016-11-02 DIAGNOSIS — M25651 Stiffness of right hip, not elsewhere classified: Secondary | ICD-10-CM | POA: Insufficient documentation

## 2016-11-02 DIAGNOSIS — R262 Difficulty in walking, not elsewhere classified: Secondary | ICD-10-CM

## 2016-11-02 DIAGNOSIS — M25551 Pain in right hip: Secondary | ICD-10-CM | POA: Diagnosis present

## 2016-11-02 DIAGNOSIS — R2681 Unsteadiness on feet: Secondary | ICD-10-CM

## 2016-11-02 NOTE — Telephone Encounter (Signed)
Appointment scheduled accordingly.  Patient aware.

## 2016-11-02 NOTE — Therapy (Signed)
[facility] [place], [place], [address] Phone: [phone]   Fax:  [phone]  Physical Therapy Evaluation  Patient Details  Name: Penny Martinez MRN: [id number] Date of Birth: February 09, 1951 Referring Provider: Sanjuana Kava  Encounter Date: 11/02/2016      PT End of Session - 11/02/16 2031    Visit Number 1   Number of Visits 12   Date for PT Re-Evaluation 11/23/16   Authorization Type Medicare (traditional)    Authorization Time Period 11/02/16-12/14/16   Authorization - Visit Number 1   Authorization - Number of Visits 10   PT Start Time 9622   PT Stop Time 1436   PT Time Calculation (min) 48 min   Activity Tolerance Patient limited by fatigue;Patient limited by pain   Behavior During Therapy [facility] for tasks assessed/performed      Past Medical History:  Diagnosis Date  . Acid reflux   . C. difficile colitis June/July 2015  . Cancer ([place])   . CHF (congestive heart failure) ([place])   . 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 Negative viral markers 2015  . Complication of anesthesia   . COPD (chronic obstructive pulmonary disease) ([place])   . Diabetes mellitus without complication ([place])   . GERD (gastroesophageal reflux disease)   . Hypercholesteremia   . Hypertension   . IBS (irritable bowel syndrome)   . Kidney disease   . Liver disease   . Thyroid disease     Past Surgical History:  Procedure Laterality Date  . ABDOMINAL HYSTERECTOMY    . APPENDECTOMY    . 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 CARPAL TUNNEL RELEASE    . CATARACT EXTRACTION    . CHOLECYSTECTOMY    . COLONOSCOPY N/A 02/19/2014   Dr. Gala Romney: rectal varices, rectal polyp overlying a varix non-manipulated  . ESOPHAGOGASTRODUODENOSCOPY N/A 02/19/2014   Dr. Gala Romney: esophageal varices, multiple gastric polyps with largest biopsied and hyperplastic. Negative H.pylori  . GIVENS CAPSULE STUDY N/A 05/04/2014   multiple polypoid-like  lesions throughout small bowel, scattered superficial non-bleeding erosions more distal bowel, referred to [facility] for enteroscopy. Capsule study not complete, poor images.   . urosepsis      There were no vitals filed for this visit.       Subjective Assessment - 11/02/16 1354    Subjective Pt reports upon waking 2-3 weeks ago pt had pain in Rt groin, Rt lateral hip, and Rt posterior hip. She saw Dr. Luna Glasgow who reports pt is having LBP and sciatica. Pt has started a course of prednisone which has helped somewhat (pt ha hard time qualifying). Pt has been on Baclophen for several months related to soem LBP issues, and it has helped somewhat per pt report.    How long can you sit comfortably? About 1 hour.    How long can you stand comfortably? Estimated about 10-12 minutes (long enough to shower)    How long can you walk comfortably? About 30-45 minutes for grocery shopping with excruciating pain thereafter.    Diagnostic tests Xrays for low back, hip/pelvis (pt doesn't recall results)    Patient Stated Goals Get back to normal like.   Currently in Pain? Yes   Pain Score 5    Pain Location --  Right posterior hip, groin, lateral hip   Pain Orientation Right   Pain Descriptors / Indicators Aching;Sharp;Tightness  unsteadiness; denies new N/T    Pain Type Acute  pain   Pain Onset 1 to 4 weeks ago   Pain Frequency Occasional   Aggravating Factors  standing/walking prolonged periods; steps/stairs; increased stiffness after sleeping overnight    Pain Relieving Factors sitting intermittently for short periods.            [facility] PT Assessment - 11/02/16 0001      Assessment   Medical Diagnosis Rt LBP with sciatica   Referring Provider Sanjuana Kava   Onset Date/Surgical Date --  1st week of Feb 2018   Hand Dominance Right   Prior Therapy none     Precautions   Precautions None     Restrictions   Weight Bearing Restrictions No     Balance Screen   Has the patient fallen in  the past 6 months No   Has the patient had a decrease in activity level because of a fear of falling?  Yes   Is the patient reluctant to leave their home because of a fear of falling?  Yes     Prior Function   Level of Independence Independent  Indep with IADL    Vocation Retired     Editor, commissioning Appears Intact     ROM / Strength   AROM / PROM / Strength Strength;PROM     PROM   PROM Assessment Site Hip   Right Hip External Rotation  48   Right Hip Internal Rotation  32   Left Hip Flexion WFL   Left Hip External Rotation  52   Left Hip Internal Rotation  16   Left Hip ABduction 30     Strength   Strength Assessment Site Hip;Knee;Ankle   Right/Left Hip Right;Left   Right Hip Flexion 4/5   Right Hip External Rotation  4/5   Right Hip Internal Rotation 4/5   Right Hip ABduction --  Horizontal Abduction: 5/5   Right Hip ADduction 5/5   Left Hip Flexion 5/5   Left Hip External Rotation 4/5  no significant increase in pain    Left Hip Internal Rotation 4/5  no significant increase in pain    Left Hip ABduction --  Horizontal Abduction: 5/5   Left Hip ADduction 5/5   Right/Left Knee Right;Left   Right Knee Flexion 4-/5   Right Knee Extension 5/5   Left Knee Flexion 5/5   Left Knee Extension 5/5   Right/Left Ankle Right;Left   Right Ankle Dorsiflexion 4-/5  painful   Left Ankle Dorsiflexion 5/5     Right Hip   Right Hip Flexion --  WFL   Right Hip ABduction 25     Palpation   Spinal mobility moderate lumbar hypomobility, lacking flexion ROM; hypermobilie thoracic spine into kyphosis   Palpation comment Peritrochanteric pain on Rt; lateral thigh tightness     Special Tests    Special Tests Hip Special Tests   Hip Special Tests  Hip Scouring;Anterior Hip Impingement Test;Other     Hip Scouring   Findings Positive   Side Right   Comments Worse with compression;   Does not tolerate longitudinal distraction of the hip.      Anterior Hip Impingement  Test    Findings Negative   Side  Right     Bed Mobility   Bed Mobility Sit to Supine;Supine to Sit   Supine to Sit 5: Supervision  moderate - maximal effort required   Sit to Supine 5: Supervision  moderate - maximal effort required     Balance  Balance Assessed --  SLS: 5s on Left; <3sec on Right                           PT Education - 11/02/16 2028    Education provided Yes   Education Details Asked patient to keep a journal on water intake, as she reports excessive water intake daily.    Person(s) Educated Patient   Methods Explanation   Comprehension Verbalized understanding          PT Short Term Goals - 11/02/16 2052      PT SHORT TERM GOAL #1   Title After 3 weeks pt will reports increased tolerance to AMB and prolonged standing by 100%.    Status New     PT SHORT TERM GOAL #2   Title After 3 weeks patient will demonstrate improved strength R ankle and knee AEB improved MMT by half a grade.    Status New           PT Long Term Goals - 11/02/16 2053      PT LONG TERM GOAL #1   Title After 6 weeks patient will demonstrate return to PLOF in IADL and community access AEB tolerance of 6MWT covering 1350 feet.    Status New     PT LONG TERM GOAL #2   Title After 6 weeks patient will demonstrate improved funcitonal strength AEB 5xSTS in <16 seconds hands free and pain free.    Status New     PT LONG TERM GOAL #3   Title After 6 weeks patient will demonstrate improved functional hip strength and stability AEB SLS >10sec bilat.    Status New               Plan - 11/02/16 2032    Clinical Impression Statement Pt presenting three weeks s/p acute onset pain in the Right greater trochanter, Right groin, and Right posterior hip joint. The patient is referred for Rt sided LBP with sciatica, although during examination she reporets that her back pain has been stable for months, and describes a pattern of pain atypical of sciatica  distribution. The patient reports worseing stiffness in the lateral hip up waking in the morning and after prolonged periods of sitting. Sensation is intact and equal bilaterally. HEavy strengthe deficits identified in the Rt knee flexors, Right ankle dorsiflexors. Pain is worse with end-range P/ROM in the Rt hip joint, as well as distraction, and compression. Pain is also worse with attempted Rt Single leg stance. The right hip demonstrates mild mobility deficits compared to the contrlateral side, but is most painful with the hip in 0 degrees flexion and supine. Deep flexion of the hips/trunk does not aggravate symptoms, nor does isometric strength testing of hip rotators. Unable to attempt additional testing of lumbar spine due to poor tolerance at time of examination. Exam findings are somewhat inconsistent with typical low-back-pain with sciatica, and more consistent with acetabular/pelvis fracture (also consider hip AVN or other intrarticular pathology). Pt denies recent tobacco use, denies supplementation of vitamin D and calcium, denies any knowledge of BMD testing, denies chronic corticosteoid use, and denies any recent falls or other traumatic etiology. Pt denies prior history of pain in this distribution. Pt denies any numbness or tingling, denies recent B/B signs, and denies any saddle paresthesia. Pt will benefit from skilled PT intervention to address the above limpairments and associate limitations to help restore the patient to PLOF in ADL, IADL, and  leisure activity.    Rehab Potential Fair   Clinical Impairments Affecting Rehab Potential Morbid obesity, history of spondylosis   PT Frequency 2x / week   PT Duration 6 weeks   PT Treatment/Interventions Balance training;Dry needling;Manual techniques;Passive range of motion;Therapeutic exercise;Therapeutic activities;Gait training;Moist Heat;Energy conservation;Functional mobility training   PT Next Visit Plan Screen lumbar spine and tolerance to  sustained posturing flexion/extension; Trial Nustep; Trunk rotation stretching in chair; Gentle 4 way hip A/ROM as tolerated, isolated strengtheing of Rt dorsiflexors and Rt knee flexors.    PT Home Exercise Plan Asked to keep a journal on water intake.    Consulted and Agree with Plan of Care Patient      Patient will benefit from skilled therapeutic intervention in order to improve the following deficits and impairments:  Abnormal gait, Improper body mechanics, Increased fascial restricitons, Pain, Postural dysfunction, Decreased activity tolerance, Decreased balance, Decreased range of motion, Decreased strength, Difficulty walking, Obesity  Visit Diagnosis: Pain in right hip  Stiffness of right hip, not elsewhere classified  Difficulty in walking, not elsewhere classified  Unsteadiness on feet      G-Codes - 11-28-16 Nov 28, 2055    Functional Assessment Tool Used (Outpatient Only) Clinical Judgment    Functional Limitation Mobility: Walking and moving around   Mobility: Walking and Moving Around Current Status [phone]) At least 60 percent but less than 80 percent impaired, limited or restricted   Mobility: Walking and Moving Around Goal Status [phone]) At least 40 percent but less than 60 percent impaired, limited or restricted       Problem List Patient Active Problem List   Diagnosis Date Noted  . Iron deficiency anemia due to chronic blood loss 01/31/2016  . Other specified hypothyroidism 09/20/2015  . Essential hypertension, benign 07/12/2015  . Morbid obesity with BMI of 40.0-44.9, adult ([place]) 07/12/2015  . Hepatic cirrhosis ([place]) 04/02/2014  . Acute colitis 03/05/2014  . Abdominal pain, acute, left lower quadrant 03/05/2014  . Colitis 03/05/2014  . Splenomegaly, congestive, chronic 02/17/2014  . Sepsis ([place]) 02/15/2014  . Absolute anemia 02/15/2014  . Diabetes mellitus with stage 3 chronic kidney disease ([place]) 02/15/2014  . CHF (congestive heart failure) ([place]) 02/15/2014   . CKD (chronic kidney disease) 02/15/2014  . UTI (lower urinary tract infection) 02/15/2014   9:00 PM, 2016/11/28 Etta Grandchild, PT, DPT Physical Therapist at [facility] [phone] (office)     [place], [place], [address] Phone: [phone]   Fax:  [phone]  Name: Penny Martinez MRN: [id number] Date of Birth: 03-Jan-1951

## 2016-11-04 ENCOUNTER — Encounter: Payer: Self-pay | Admitting: Orthopedic Surgery

## 2016-11-04 ENCOUNTER — Encounter (HOSPITAL_BASED_OUTPATIENT_CLINIC_OR_DEPARTMENT_OTHER): Payer: Medicare Other | Admitting: Oncology

## 2016-11-04 ENCOUNTER — Encounter (HOSPITAL_COMMUNITY): Payer: Self-pay

## 2016-11-04 ENCOUNTER — Ambulatory Visit (HOSPITAL_COMMUNITY): Payer: Medicare Other | Admitting: Physical Therapy

## 2016-11-04 ENCOUNTER — Encounter (HOSPITAL_COMMUNITY): Payer: Medicare Other | Attending: Oncology

## 2016-11-04 ENCOUNTER — Ambulatory Visit (INDEPENDENT_AMBULATORY_CARE_PROVIDER_SITE_OTHER): Payer: Medicare Other | Admitting: Orthopedic Surgery

## 2016-11-04 ENCOUNTER — Telehealth (HOSPITAL_COMMUNITY): Payer: Self-pay | Admitting: Physical Therapy

## 2016-11-04 VITALS — BP 102/50 | HR 79 | Temp 98.3°F | Resp 20 | Wt 224.2 lb

## 2016-11-04 VITALS — BP 111/60 | HR 82 | Ht 62.0 in | Wt 224.0 lb

## 2016-11-04 DIAGNOSIS — Z9049 Acquired absence of other specified parts of digestive tract: Secondary | ICD-10-CM | POA: Diagnosis not present

## 2016-11-04 DIAGNOSIS — D696 Thrombocytopenia, unspecified: Secondary | ICD-10-CM | POA: Insufficient documentation

## 2016-11-04 DIAGNOSIS — Z7982 Long term (current) use of aspirin: Secondary | ICD-10-CM | POA: Insufficient documentation

## 2016-11-04 DIAGNOSIS — D500 Iron deficiency anemia secondary to blood loss (chronic): Secondary | ICD-10-CM | POA: Diagnosis present

## 2016-11-04 DIAGNOSIS — M6530 Trigger finger, unspecified finger: Secondary | ICD-10-CM

## 2016-11-04 DIAGNOSIS — Z9889 Other specified postprocedural states: Secondary | ICD-10-CM | POA: Diagnosis not present

## 2016-11-04 DIAGNOSIS — I509 Heart failure, unspecified: Secondary | ICD-10-CM | POA: Insufficient documentation

## 2016-11-04 DIAGNOSIS — I110 Hypertensive heart disease with heart failure: Secondary | ICD-10-CM | POA: Insufficient documentation

## 2016-11-04 DIAGNOSIS — Z90710 Acquired absence of both cervix and uterus: Secondary | ICD-10-CM | POA: Diagnosis not present

## 2016-11-04 DIAGNOSIS — G5602 Carpal tunnel syndrome, left upper limb: Secondary | ICD-10-CM

## 2016-11-04 DIAGNOSIS — Z87891 Personal history of nicotine dependence: Secondary | ICD-10-CM | POA: Insufficient documentation

## 2016-11-04 DIAGNOSIS — K7460 Unspecified cirrhosis of liver: Secondary | ICD-10-CM | POA: Insufficient documentation

## 2016-11-04 DIAGNOSIS — Z880 Allergy status to penicillin: Secondary | ICD-10-CM | POA: Diagnosis not present

## 2016-11-04 DIAGNOSIS — Z79899 Other long term (current) drug therapy: Secondary | ICD-10-CM | POA: Diagnosis not present

## 2016-11-04 LAB — CBC WITH DIFFERENTIAL/PLATELET
Basophils Absolute: 0 10*3/uL (ref 0.0–0.1)
Basophils Relative: 0 %
Eosinophils Absolute: 0.1 10*3/uL (ref 0.0–0.7)
Eosinophils Relative: 1 %
HEMATOCRIT: 40.1 % (ref 36.0–46.0)
HEMOGLOBIN: 12.9 g/dL (ref 12.0–15.0)
LYMPHS ABS: 1.3 10*3/uL (ref 0.7–4.0)
LYMPHS PCT: 19 %
MCH: 30.8 pg (ref 26.0–34.0)
MCHC: 32.2 g/dL (ref 30.0–36.0)
MCV: 95.7 fL (ref 78.0–100.0)
MONO ABS: 0.5 10*3/uL (ref 0.1–1.0)
MONOS PCT: 7 %
NEUTROS ABS: 5 10*3/uL (ref 1.7–7.7)
NEUTROS PCT: 72 %
Platelets: 89 10*3/uL — ABNORMAL LOW (ref 150–400)
RBC: 4.19 MIL/uL (ref 3.87–5.11)
RDW: 14.7 % (ref 11.5–15.5)
WBC: 6.9 10*3/uL (ref 4.0–10.5)

## 2016-11-04 LAB — FERRITIN: Ferritin: 94 ng/mL (ref 11–307)

## 2016-11-04 NOTE — Telephone Encounter (Signed)
She is feeling sick and can  not come in today

## 2016-11-04 NOTE — Patient Instructions (Signed)
[place] at [facility] Discharge Instructions  RECOMMENDATIONS MADE BY THE CONSULTANT AND ANY TEST RESULTS WILL BE SENT TO YOUR REFERRING PHYSICIAN.  You were seen today by Dr. Twana First Lab work in 3 months Follow up in 6 months with lab work See Amy up front for appointments   Thank you for choosing [place] at [facility] to provide your oncology and hematology care.  To afford each patient quality time with our provider, please arrive at least 15 minutes before your scheduled appointment time.    If you have a lab appointment with the [facility] please come in thru the  Main Entrance and check in at the main information desk  You need to re-schedule your appointment should you arrive 10 or more minutes late.  We strive to give you quality time with our providers, and arriving late affects you and other patients whose appointments are after yours.  Also, if you no show three or more times for appointments you may be dismissed from the clinic at the providers discretion.     Again, thank you for choosing [facility].  Our hope is that these requests will decrease the amount of time that you wait before being seen by our physicians.       _____________________________________________________________  Should you have questions after your visit to [facility], please contact our office at [phone] between the hours of 8:30 a.m. and 4:30 p.m.  Voicemails left after 4:30 p.m. will not be returned until the following business day.  For prescription refill requests, have your pharmacy contact our office.       Resources For Cancer Patients and their Caregivers ? American Cancer Society: Can assist with transportation, wigs, general needs, runs Look Good Feel Better.        819 685 8522 ? Cancer Care: Provides financial assistance, online support groups, medication/co-pay assistance.  1-800-813-HOPE  5805928112) ? [facility] Assists [place] cancer patients and their families through emotional , educational and financial support.  949-490-3600 ? [place] DSS Where to apply for food stamps, Medicaid and utility assistance. 858-533-3031 ? [facility]: Transportation to medical appointments. 989-248-8367 ? Social Security Administration: May apply for disability if have a Stage IV cancer. (415) 784-2288 (605)012-8434 ? [employer], Disability and Transit Services: Assists with nutrition, care and transit needs. [place] Support Programs: @10RELATIVEDAYS @ > Cancer Support Group  2nd Tuesday of the month 1pm-2pm, Journey Room  > Creative Journey  3rd Tuesday of the month 1130am-1pm, Journey Room  > Look Good Feel Better  1st Wednesday of the month 10am-12 noon, Journey Room (Call [place] to register [phone])

## 2016-11-04 NOTE — Progress Notes (Signed)
Patient ID: Penny Martinez, female   DOB: 05/04/1951, 66 y.o.   MRN: [id number]  Chief Complaint  Patient presents with  . Hand Problem    Pain paresthesias left hand for 20 years, triggering of the left long finger     HPI Penny Martinez is a 66 y.o. female.  Presents for possible surgery in her left hand. She is a carpal tunnel symptoms for 20 years. Showed a right carpal tunnel release. That was worse at the time she never had a left one done. She has pain paresthesias night pain which was severe associated with fine motor tasks deficits and triggering of the left long finger which did not respond to nonoperative care  Review of Systems Review of Systems ROS   Review of Systems  HENT: Negative for congestion.   Respiratory: Positive for shortness of breath. Negative for cough.   Cardiovascular: Negative for chest pain and leg swelling.  Endocrine: Positive for cold intolerance.  Musculoskeletal: Positive for arthralgias and joint swelling.  Allergic/Immunologic: Positive for environmental allergies.    Past Medical History:  Diagnosis Date  . Acid reflux   . C. difficile colitis June/July 2015  . Cancer ([place])   . CHF (congestive heart failure) ([place])   . 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 Negative viral markers 2015  . Complication of anesthesia   . COPD (chronic obstructive pulmonary disease) ([place])   . Diabetes mellitus without complication ([place])   . GERD (gastroesophageal reflux disease)   . Hypercholesteremia   . Hypertension   . IBS (irritable bowel syndrome)   . Kidney disease   . Liver disease   . Thyroid disease     Past Surgical History:  Procedure Laterality Date  . ABDOMINAL HYSTERECTOMY    . APPENDECTOMY    . 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 CARPAL TUNNEL RELEASE    . CATARACT EXTRACTION    . CHOLECYSTECTOMY    . COLONOSCOPY N/A 02/19/2014   Dr. Gala Romney: rectal varices, rectal polyp overlying a varix non-manipulated  .  ESOPHAGOGASTRODUODENOSCOPY N/A 02/19/2014   Dr. Gala Romney: esophageal varices, multiple gastric polyps with largest biopsied and hyperplastic. Negative H.pylori  . GIVENS CAPSULE STUDY N/A 05/04/2014   multiple polypoid-like lesions throughout small bowel, scattered superficial non-bleeding erosions more distal bowel, referred to [facility] for enteroscopy. Capsule study not complete, poor images.   . urosepsis      Social History Social History  Substance Use Topics  . Smoking status: Former Smoker    Packs/day: 2.00    Years: 35.00    Quit date: 05/05/2003  . Smokeless tobacco: Former Systems developer     Comment: Quit smoking 11 years ago  . Alcohol use No    Allergies  Allergen Reactions  . Niacin Rash and Shortness Of Breath  . Penicillins Shortness Of Breath  . Erythromycin Hives  . Statins     Other reaction(s): Muscle Pain  . Sulfa Antibiotics     Current Meds  Medication Sig  . amitriptyline (ELAVIL) 25 MG tablet Take 25 mg by mouth at bedtime.  Marland Kitchen aspirin 81 MG tablet Take 81 mg by mouth daily.  . baclofen (LIORESAL) 10 MG tablet Take 10 mg by mouth 3 (three) times daily.  . benazepril (LOTENSIN) 20 MG tablet Take 20 mg by mouth daily.  Marland Kitchen CRANBERRY-VITAMIN C PO Take 2 capsules by mouth at bedtime. Dosage is the 25,200 mg cranberry/vitamin C supplement  . diphenhydrAMINE [facility])  25 MG tablet Take by mouth at bedtime. Reported on 01/31/2016  . diphenoxylate-atropine (LOMOTIL) 2.5-0.025 MG tablet 1-2 tablets every 4-6 hours as needed for diarrhea.  . ezetimibe (ZETIA) 10 MG tablet Take 10 mg by mouth daily.  . furosemide (LASIX) 80 MG tablet Take 80 mg by mouth daily.  Marland Kitchen gabapentin (NEURONTIN) 100 MG capsule TAKE (1) CAPSULE BY MOUTH THREE TIMES DAILY  . HUMULIN R U-500 KWIKPEN 500 UNIT/ML kwikpen INJECT 130 UNITS S.Q. THREE TIMES DAILY WITH MEALS.  Marland Kitchen lansoprazole (PREVACID) 30 MG capsule Take 30 mg by mouth daily.  Marland Kitchen levothyroxine (SYNTHROID, LEVOTHROID) 100 MCG tablet Take 1 tablet (100  mcg total) by mouth daily.  . Multiple Vitamins-Minerals (CENTRUM SILVER PO) Take 1 tablet by mouth daily.   . nitrofurantoin, macrocrystal-monohydrate, (MACROBID) 100 MG capsule Take 100 mg by mouth at bedtime.  . potassium chloride (K-DUR) 10 MEQ tablet Take 10 mEq by mouth daily.  . predniSONE (STERAPRED UNI-PAK 21 TAB) 5 MG (21) TBPK tablet Take 6 pills first day; 5 pills second day; 4 pills third day; 3 pills fourth day; 2 pills next day and 1 pill last day.  . propranolol (INDERAL) 10 MG tablet Take 2 tablets (20 mg total) by mouth daily.  Marland Kitchen ULTICARE SHORT PEN NEEDLES 31G X 8 MM MISC USE AS DIRECTED THREE TIMES DAILY.  Marland Kitchen VICTOZA 18 MG/3ML SOPN INJECT 1.8 MG S.Q. ONCE DAILY.      Physical Exam Physical Exam 1.BP 111/60   Pulse 82   Ht 5' 2"  (1.575 m)   Wt 224 lb (101.6 kg)   BMI 40.97 kg/m   2. Gen. appearance. The patient is well-developed and well-nourished, grooming and hygiene are normal. There are no gross congenital abnormalities  3. The patient is alert and oriented to person place and time  4. Mood and affect are normal  5. Ambulation NORMAL  Examination reveals the following: 6. On inspection we find tenderness over the A1 pulley of the left long finger no catching or locking on today's exam neurovascular status of the digit is normal. She has tenderness over the carpal tunnel positive, pression test at the carpal tunnel with normal wrist stability. Her right left grip strength shows a slight deficit on the left skin is normal. The sensory losses in the median nerve distribution color and perfusion are normal MEDICAL DECISION MAKING:    Data Reviewed Dr. Brooke Bonito notes of been reviewed  Assessment Carpal tunnel syndrome left  Left long trigger finger    Plan Carpal tunnel release with left long trigger finger release  Arther Abbott 11/04/2016, 10:18 AM

## 2016-11-04 NOTE — Patient Instructions (Signed)
CARPAL TUNNEL AND TRIGGER FINGER RELEASE SURGERY 12/03/16

## 2016-11-04 NOTE — Progress Notes (Signed)
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 female is here because for f/u of anemia and iron deficiency. She has received 3 doses IV iron in the past and tolerated well.   She is doing well today. Hgb 12.9 today. Currently not taking any iron supplements. Pt is tired often. She has surgery next month for her carpal tunnel. She goes to PT for her Sciatica. Denies melena, chest pain, abdominal pain, SOB, loss of appetite, easy bruising/bleeding, or any other concerns.   MEDICAL HISTORY:  Past Medical History:  Diagnosis Date  . Acid reflux   . C. difficile colitis June/July 2015  . Cancer ([place])   . CHF (congestive heart failure) ([place])   . 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 Negative viral markers 2015  . Complication of anesthesia   . COPD (chronic obstructive pulmonary disease) ([place])   . Diabetes mellitus without complication ([place])   . GERD (gastroesophageal reflux disease)   . Hypercholesteremia   . Hypertension   . IBS (irritable bowel syndrome)   . Kidney disease   . Liver disease   . Thyroid disease     SURGICAL HISTORY: Past Surgical History:  Procedure Laterality Date  . ABDOMINAL HYSTERECTOMY    . APPENDECTOMY    . 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 CARPAL TUNNEL RELEASE    . CATARACT EXTRACTION    . CHOLECYSTECTOMY    . COLONOSCOPY  N/A 02/19/2014   Dr. Gala Romney: rectal varices, rectal polyp overlying a varix non-manipulated  . ESOPHAGOGASTRODUODENOSCOPY N/A 02/19/2014   Dr. Gala Romney: esophageal varices, multiple gastric polyps with largest biopsied and hyperplastic. Negative H.pylori  . GIVENS CAPSULE STUDY N/A 05/04/2014   multiple polypoid-like lesions throughout small bowel, scattered superficial non-bleeding erosions more distal bowel, referred to [facility]oscopy. Capsule study not complete, poor images.   . urosepsis      SOCIAL HISTORY: Social History   Social History  . Marital status: Married    Spouse name: N/A  . Number of children: N/A  . Years of education: N/A   Occupational History  . Not on file.   Social History Main Topics  . Smoking status: Former Smoker    Packs/day: 2.00    Years: 35.00    Quit date: 05/05/2003  . Smokeless tobacco: Former Systems developer     Comment: Quit smoking 11 years ago  . Alcohol use No  . Drug use: No  . Sexual activity: Not Currently    Birth control/ protection: Surgical   Other Topics Concern  . Not on file   Social History Narrative  . 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 ETOH, none She used to crossstitch but no longer can with her hands.  FAMILY HISTORY: Family History  Problem Relation Age of Onset  . Diabetes Mother   .  Hypertension Mother   . Kidney failure Mother   . Blindness Mother   . Aneurysm Father   . Colon cancer Neg Hx    Mother was diabetic and ended up on dialysis, she died at 19 years-old Father died of an aneurysm at 42 years-old 33 brother and 1 sister Brother is borderline diabetic with heart issues Sister is fairly healthy  ALLERGIES:  is allergic to niacin; penicillins; erythromycin; statins; and sulfa antibiotics.  MEDICATIONS:  Current Outpatient Prescriptions  Medication Sig Dispense Refill  .  amitriptyline (ELAVIL) 25 MG tablet Take 25 mg by mouth at bedtime.    Marland Kitchen aspirin 81 MG tablet Take 81 mg by mouth daily.    . baclofen (LIORESAL) 10 MG tablet Take 10 mg by mouth 3 (three) times daily.    . benazepril (LOTENSIN) 20 MG tablet Take 20 mg by mouth daily.    Marland Kitchen CRANBERRY-VITAMIN C PO Take 2 capsules by mouth at bedtime. Dosage is the 25,200 mg cranberry/vitamin C supplement    . diphenhydrAMINE (SOMINEX) 25 MG tablet Take by mouth at bedtime. Reported on 01/31/2016    . diphenoxylate-atropine (LOMOTIL) 2.5-0.025 MG tablet 1-2 tablets every 4-6 hours as needed for diarrhea. 120 tablet 3  . ezetimibe (ZETIA) 10 MG tablet Take 10 mg by mouth daily.    . furosemide (LASIX) 80 MG tablet Take 80 mg by mouth daily.    Marland Kitchen gabapentin (NEURONTIN) 100 MG capsule TAKE (1) CAPSULE BY MOUTH THREE TIMES DAILY 90 capsule 2  . HUMULIN R U-500 KWIKPEN 500 UNIT/ML kwikpen INJECT 130 UNITS S.Q. THREE TIMES DAILY WITH MEALS. 18 mL 2  . lansoprazole (PREVACID) 30 MG capsule Take 30 mg by mouth daily.    Marland Kitchen levothyroxine (SYNTHROID, LEVOTHROID) 100 MCG tablet Take 1 tablet (100 mcg total) by mouth daily. 90 tablet 1  . Multiple Vitamins-Minerals (CENTRUM SILVER PO) Take 1 tablet by mouth daily.     . nitrofurantoin, macrocrystal-monohydrate, (MACROBID) 100 MG capsule Take 100 mg by mouth at bedtime.  1  . potassium chloride (K-DUR) 10 MEQ tablet Take 10 mEq by mouth daily.    . predniSONE (STERAPRED UNI-PAK 21 TAB) 5 MG (21) TBPK tablet Take 6 pills first day; 5 pills second day; 4 pills third day; 3 pills fourth day; 2 pills next day and 1 pill last day. 21 tablet 0  . propranolol (INDERAL) 10 MG tablet Take 2 tablets (20 mg total) by mouth daily. 60 tablet 11  . ULTICARE SHORT PEN NEEDLES 31G X 8 MM MISC USE AS DIRECTED THREE TIMES DAILY. 100 each 5  . VICTOZA 18 MG/3ML SOPN INJECT 1.8 MG S.Q. ONCE DAILY. 9 mL 2   No current facility-administered medications for this visit.     Review of Systems    Constitutional: Positive for malaise/fatigue.       No loss of appetite.   HENT: Negative.   Eyes: Negative.   Respiratory: Negative.  Negative for shortness of breath.   Cardiovascular: Negative.  Negative for chest pain.  Gastrointestinal: Negative.  Negative for abdominal pain and melena.  Genitourinary: Negative.   Musculoskeletal:       Sciatica Carpal tunnel  Skin: Negative.   Neurological: Negative.   Endo/Heme/Allergies: Negative.  Does not bruise/bleed easily.  Psychiatric/Behavioral: Negative.   All other systems reviewed and are negative. 14 point ROS was done and is otherwise as detailed above or in HPI  PHYSICAL EXAMINATION: ECOG PERFORMANCE STATUS: 1 - Symptomatic but completely ambulatory  Vitals:  11/04/16 1139  BP: (!) 102/50  Pulse: 79  Resp: 20  Temp: 98.3 F (36.8 C)   Filed Weights   11/04/16 1139  Weight: 224 lb 3.2 oz (101.7 kg)   Physical Exam  Constitutional: She is oriented to person, place, and time and well-developed, well-nourished, and in no distress.  Obese. Wears glasses.  HENT:  Head: Normocephalic and atraumatic.  Nose: Nose normal.  Mouth/Throat: Oropharynx is clear and moist. No oropharyngeal exudate.  Eyes: Conjunctivae and EOM are normal. Pupils are equal, round, and reactive to light. Right eye exhibits no discharge. Left eye exhibits no discharge. No scleral icterus.  Neck: Normal range of motion. Neck supple. No tracheal deviation present. No thyromegaly present.  Cardiovascular: Normal rate, regular rhythm and normal heart sounds.  Exam reveals no gallop and no friction rub.   No murmur heard. Pulmonary/Chest: Effort normal and breath sounds normal. She has no wheezes. She has no rales.  Abdominal: Soft. Bowel sounds are normal. She exhibits no distension and no mass. There is no tenderness. There is no rebound and no guarding.  Musculoskeletal: Normal range of motion. She exhibits edema.  Bilateral lower extremity woody  edema (Chronic)  Lymphadenopathy:    She has no cervical adenopathy.  Neurological: She is alert and oriented to person, place, and time. She has normal reflexes. No cranial nerve deficit. Gait normal. Coordination normal.  Skin: Skin is warm and dry. No rash noted.  Psychiatric: Mood, memory, affect and judgment normal.  Nursing note and vitals reviewed.  LABORATORY DATA:  I have reviewed the data as listed Lab Results  Component Value Date   WBC 6.9 11/04/2016   HGB 12.9 11/04/2016   HCT 40.1 11/04/2016   MCV 95.7 11/04/2016   PLT 89 (L) 11/04/2016   CMP     Component Value Date/Time   NA 137 09/30/2016 1005   K 4.0 09/30/2016 1005   CL 99 (L) 09/30/2016 1005   CO2 29 09/30/2016 1005   GLUCOSE 96 09/30/2016 1005   BUN 33 (H) 09/30/2016 1005   CREATININE 1.21 (H) 09/30/2016 1005   CREATININE 1.14 (H) 12/18/2015 1645   CALCIUM 9.5 09/30/2016 1005   PROT 7.6 09/30/2016 1005   ALBUMIN 3.7 09/30/2016 1005   AST 54 (H) 09/30/2016 1005   ALT 30 09/30/2016 1005   ALKPHOS 94 09/30/2016 1005   BILITOT 0.7 09/30/2016 1005   GFRNONAA 46 (L) 09/30/2016 1005   GFRNONAA 51 (L) 12/18/2015 1645   GFRAA 53 (L) 09/30/2016 1005   GFRAA 59 (L) 12/18/2015 1645     RADIOGRAPHIC STUDIES: I have personally reviewed the radiological images as listed and agreed with the findings in the report. No results found.  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  We discussed that her iron deficiency may be secondary to GI related blood loss  from her varices. In addition it is likely she may have a component of malabsorption. She has difficulty tolerating oral iron. She has done well with IV iron. Counts are markedly improved.  Hgb 12.9 today. Iron pending. Maintqin ferritin >100.  Thrombocytopenia stable. Continue to monitor.   Labs every 3  months; CBC, CMP, Iron study, ferritin, B12. RTC for f/u in 6 months.    All questions were answered. The patient knows to call the clinic with any problems, questions or concerns.  This document serves as a record of services personally performed by Twana First, MD. It was created on her behalf by Martinique Casey, a trained medical scribe. The creation of this record is based on the scribe's personal observations and the provider's statements to them. This document has been checked and approved by the attending provider.  I have reviewed the above documentation for accuracy and completeness, and I agree with the above.  This note was electronically signed.    Martinique M Casey  11/04/2016 11:40 AM

## 2016-11-05 ENCOUNTER — Ambulatory Visit (HOSPITAL_COMMUNITY): Payer: Medicare Other

## 2016-11-06 ENCOUNTER — Other Ambulatory Visit (HOSPITAL_COMMUNITY): Payer: Medicare Other

## 2016-11-06 ENCOUNTER — Ambulatory Visit (HOSPITAL_COMMUNITY): Payer: Medicare Other

## 2016-11-10 ENCOUNTER — Ambulatory Visit (HOSPITAL_COMMUNITY): Payer: Medicare Other | Attending: Orthopaedic Surgery | Admitting: Physical Therapy

## 2016-11-10 DIAGNOSIS — R262 Difficulty in walking, not elsewhere classified: Secondary | ICD-10-CM | POA: Diagnosis present

## 2016-11-10 DIAGNOSIS — M25551 Pain in right hip: Secondary | ICD-10-CM

## 2016-11-10 DIAGNOSIS — M25651 Stiffness of right hip, not elsewhere classified: Secondary | ICD-10-CM | POA: Diagnosis present

## 2016-11-10 DIAGNOSIS — R2681 Unsteadiness on feet: Secondary | ICD-10-CM

## 2016-11-10 NOTE — Patient Instructions (Signed)
Bridge [employer] small of back into mat, maintain pelvic tilt, roll up one vertebrae at a time. Focus on engaging posterior hip muscles. Complete slowly. Repeat __2__ times.   Hip Abduction: Side-Lying (Single Leg)    Lie on side with knees bent Raise top leg, keeping knee bent. Repeat _10_ times per set.  Do _2_ sets per session.    Piriformis Stretch - Supine    Pull uninvolved knee across body toward opposite shoulder. Hold slight stretch for _30__ seconds. Repeat with involved leg.Repeat _3__ times. Do _2__ times per day.   Piriformis Stretch, Sitting

## 2016-11-10 NOTE — Therapy (Signed)
[place], [place], [address] Phone: [phone]   Fax:  [phone]  Physical Therapy Treatment  Patient Details  Name: Penny Martinez MRN: [id number] Date of Birth: [birth year] Referring Provider: Sanjuana Kava  Encounter Date: 11/10/2016      PT End of Session - 11/10/16 1151    Visit Number 2   Number of Visits 12   Date for PT Re-Evaluation 11/23/16   Authorization Type Medicare (traditional)    Authorization Time Period 11/02/16-12/14/16   Authorization - Visit Number 2   Authorization - Number of Visits 10   PT Start Time 1040   PT Stop Time 1130   PT Time Calculation (min) 50 min   Activity Tolerance Patient limited by fatigue;Patient limited by pain   Behavior During Therapy [facility] for tasks assessed/performed      Past Medical History:  Diagnosis Date  . Acid reflux   . C. difficile colitis June/July 2015  . Cancer ([place])   . CHF (congestive heart failure) ([place])   . 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 Negative viral markers 2015  . Complication of anesthesia   . COPD (chronic obstructive pulmonary disease) ([place])   . Diabetes mellitus without complication ([place])   . GERD (gastroesophageal reflux disease)   . Hypercholesteremia   . Hypertension   . IBS (irritable bowel syndrome)   . Kidney disease   . Liver disease   . Thyroid disease     Past Surgical History:  Procedure Laterality Date  . ABDOMINAL HYSTERECTOMY    . APPENDECTOMY    . 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 CARPAL TUNNEL RELEASE    . CATARACT EXTRACTION    . CHOLECYSTECTOMY    . COLONOSCOPY N/A 02/19/2014   Dr. Gala Romney: rectal varices, rectal polyp overlying a varix non-manipulated  . ESOPHAGOGASTRODUODENOSCOPY N/A 02/19/2014   Dr. Gala Romney: esophageal varices, multiple gastric polyps with largest biopsied and hyperplastic. Negative H.pylori  . GIVENS CAPSULE STUDY N/A 05/04/2014   multiple polypoid-like lesions  throughout small bowel, scattered superficial non-bleeding erosions more distal bowel, referred to [facility] for enteroscopy. Capsule study not complete, poor images.   . urosepsis      There were no vitals filed for this visit.      Subjective Assessment - 11/10/16 1046    Subjective Pt states she has been in severe pain 10/10 since her appointment here.  STates she has hardly slept.  Reports less water intake in the past week.   Currently in Pain? Yes   Pain Score 10-Worst pain ever   Pain Location Hip   Pain Orientation Right;Posterior   Pain Descriptors / Indicators Aching                         [facility] Adult PT Treatment/Exercise - 11/10/16 0001      Lumbar Exercises: Stretches   Active Hamstring Stretch 2 reps;30 seconds   Active Hamstring Stretch Limitations with towel   Piriformis Stretch 2 reps;30 seconds   Piriformis Stretch Limitations shown in seated and supine with towel     Lumbar Exercises: Supine   Heel Slides 10 reps   Bridge 10 reps     Lumbar Exercises: Sidelying   Clam 10 reps   Clam Limitations right only     Manual Therapy   Manual Therapy Myofascial release;Soft tissue mobilization   Manual therapy comments completed seperately from  all other skilled interventions   Soft tissue mobilization to Rt glute and piriformis in Lt sidelying   Myofascial Release iliopsoas release on Rt                PT Education - 11/10/16 1150    Education provided Yes   Education Details reviewed evaluation goals, given HEP including stretches   Person(s) Educated Patient   Methods Explanation;Demonstration;Tactile cues;Verbal cues;Handout   Comprehension Verbalized understanding;Returned demonstration;Verbal cues required;Tactile cues required;Need further instruction          PT Short Term Goals - 11/02/16 2052      PT SHORT TERM GOAL #1   Title After 3 weeks pt will reports increased tolerance to AMB and prolonged standing by 100%.     Status New     PT SHORT TERM GOAL #2   Title After 3 weeks patient will demonstrate improved strength R ankle and knee AEB improved MMT by half a grade.    Status New           PT Long Term Goals - 11/02/16 2053      PT LONG TERM GOAL #1   Title After 6 weeks patient will demonstrate return to PLOF in IADL and community access AEB tolerance of 6MWT covering 1350 feet.    Status New     PT LONG TERM GOAL #2   Title After 6 weeks patient will demonstrate improved funcitonal strength AEB 5xSTS in <16 seconds hands free and pain free.    Status New     PT LONG TERM GOAL #3   Title After 6 weeks patient will demonstrate improved functional hip strength and stability AEB SLS >10sec bilat.    Status New               Plan - 11/10/16 1151    Clinical Impression Statement Pt with increased pain this session to 10/10 with inability to get quality sleep at night.  Rt glute and groin pain persists.  Initiated HEP this session.  Instructed with both seated and supine piriformis stretch, which elicited relief after completing.  Noted extreme tightness in hamstring and piriformis.  Began glute strengthening exercises and given for HEP.  Iliopsoas release completed in supine with extreme tightness.  Able to diminished tightness with overall improvement at end of session to 5/10 pain (5 level decrease).  Pt with improved disposition as well at end of session due to decreased pain.    Rehab Potential Fair   Clinical Impairments Affecting Rehab Potential Morbid obesity, history of spondylosis   PT Frequency 2x / week   PT Duration 6 weeks   PT Treatment/Interventions Balance training;Dry needling;Manual techniques;Passive range of motion;Therapeutic exercise;Therapeutic activities;Gait training;Moist Heat;Energy conservation;Functional mobility training   PT Next Visit Plan to be with evaluating PT next session to Screen lumbar spine and tolerance to sustained posturing flexion/extension; Trial  Nustep; Trunk rotation stretching in chair; Gentle 4 way hip A/ROM as tolerated, isolated strengtheing of Rt dorsiflexors and Rt knee flexors.    PT Home Exercise Plan Asked to keep a journal on water intake. 3/6:  piriformis stretch (seated/supine), side lying clams, bridge   Consulted and Agree with Plan of Care Patient      Patient will benefit from skilled therapeutic intervention in order to improve the following deficits and impairments:  Abnormal gait, Improper body mechanics, Increased fascial restricitons, Pain, Postural dysfunction, Decreased activity tolerance, Decreased balance, Decreased range of motion, Decreased strength, Difficulty walking, Obesity  Visit  Diagnosis: Pain in right hip  Stiffness of right hip, not elsewhere classified  Difficulty in walking, not elsewhere classified  Unsteadiness on feet     Problem List Patient Active Problem List   Diagnosis Date Noted  . Thrombocytopenia ([place]) 11/04/2016  . Iron deficiency anemia due to chronic blood loss 01/31/2016  . Other specified hypothyroidism 09/20/2015  . Essential hypertension, benign 07/12/2015  . Morbid obesity with BMI of 40.0-44.9, adult ([place]) 07/12/2015  . Hepatic cirrhosis ([place]) 04/02/2014  . Acute colitis 03/05/2014  . Abdominal pain, acute, left lower quadrant 03/05/2014  . Colitis 03/05/2014  . Splenomegaly, congestive, chronic 02/17/2014  . Sepsis ([place]) 02/15/2014  . Absolute anemia 02/15/2014  . Diabetes mellitus with stage 3 chronic kidney disease ([place]) 02/15/2014  . CHF (congestive heart failure) ([place]) 02/15/2014  . CKD (chronic kidney disease) 02/15/2014  . UTI (lower urinary tract infection) 02/15/2014    Teena Irani, PTA/CLT [phone]  11/10/2016, 11:57 AM  [place] [address] [place], [place], [address] Phone: [phone]   Fax:  [phone]  Name: TAYGAN CONNELL MRN: [id number] Date of Birth: Aug 31, 1951

## 2016-11-12 ENCOUNTER — Ambulatory Visit (HOSPITAL_COMMUNITY): Payer: Medicare Other

## 2016-11-12 DIAGNOSIS — M25551 Pain in right hip: Secondary | ICD-10-CM | POA: Diagnosis not present

## 2016-11-12 DIAGNOSIS — R2681 Unsteadiness on feet: Secondary | ICD-10-CM

## 2016-11-12 DIAGNOSIS — M25651 Stiffness of right hip, not elsewhere classified: Secondary | ICD-10-CM

## 2016-11-12 DIAGNOSIS — R262 Difficulty in walking, not elsewhere classified: Secondary | ICD-10-CM

## 2016-11-12 NOTE — Therapy (Signed)
[place], [place], [address] Phone: [phone]   Fax:  [phone]  Physical Therapy Treatment  Patient Details  Name: Penny Martinez MRN: [id number] Date of Birth: 04-14-1951 Referring Provider: Sanjuana Kava  Encounter Date: 11/12/2016      PT End of Session - 11/12/16 2050    Visit Number 3   Number of Visits 12   Date for PT Re-Evaluation 11/23/16   Authorization Type Medicare (traditional)    Authorization Time Period 11/02/16-12/14/16   Authorization - Visit Number 3   Authorization - Number of Visits 10   PT Start Time 1497   PT Stop Time 1427   PT Time Calculation (min) 38 min   Activity Tolerance Patient tolerated treatment well;No increased pain;Patient limited by pain   Behavior During Therapy [facility] for tasks assessed/performed      Past Medical History:  Diagnosis Date  . Acid reflux   . C. difficile colitis June/July 2015  . Cancer ([place])   . CHF (congestive heart failure) ([place])   . 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 Negative viral markers 2015  . Complication of anesthesia   . COPD (chronic obstructive pulmonary disease) ([place])   . Diabetes mellitus without complication ([place])   . GERD (gastroesophageal reflux disease)   . Hypercholesteremia   . Hypertension   . IBS (irritable bowel syndrome)   . Kidney disease   . Liver disease   . Thyroid disease     Past Surgical History:  Procedure Laterality Date  . ABDOMINAL HYSTERECTOMY    . APPENDECTOMY    . 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 CARPAL TUNNEL RELEASE    . CATARACT EXTRACTION    . CHOLECYSTECTOMY    . COLONOSCOPY N/A 02/19/2014   Dr. Gala Romney: rectal varices, rectal polyp overlying a varix non-manipulated  . ESOPHAGOGASTRODUODENOSCOPY N/A 02/19/2014   Dr. Gala Romney: esophageal varices, multiple gastric polyps with largest biopsied and hyperplastic. Negative H.pylori  . GIVENS CAPSULE STUDY N/A 05/04/2014    multiple polypoid-like lesions throughout small bowel, scattered superficial non-bleeding erosions more distal bowel, referred to [facility] for enteroscopy. Capsule study not complete, poor images.   . urosepsis      There were no vitals filed for this visit.      Subjective Assessment - 11/12/16 1355    Subjective Pt reports significant improvement of pain at end of last session, but only until returning to the car to leave session. She has been compliant with HEP and she says they all feel better except for HS stretch issued last visit. Pain is still worse only minutes after getting into her bed to sleep at night.    Currently in Pain? Yes   Pain Score 10-Worst pain ever   Pain Location --  Rt lateral hip, Rt posterior hip in sciatic distributio, Rt anterior thigh   Pain Orientation Right   Pain Descriptors / Indicators Burning            [facility] PT Assessment - 11/12/16 0001      Posture/Postural Control   Posture/Postural Control Postural limitations   Postural Limitations Forward head;Increased lumbar lordosis;Anterior pelvic tilt     Special Tests    Special Tests Lumbar   Lumbar Tests Slump Test     Slump test   Findings Negative   Side Right   Comment Symptoms resolve with sustained time in this position.  [facility] Adult PT Treatment/Exercise - 11/12/16 0001      Ambulation/Gait   Ambulation Distance (Feet) 225 Feet   Assistive device None   Gait Pattern Within Functional Limits   Gait velocity 0.74ms   Gait Comments Gait trial post NuStep: 90% reduction in pain for first 60sec, worsening thereafter     Lumbar Exercises: Stretches   Double Knee to Chest Stretch 1 rep  1x10 minutes   Double Knee to Chest Stretch Limitations Sustained lumbar spine stretch (into flexion)   Supine, legs elevated on stool, hips to 90 degrees     Lumbar Exercises: Aerobic   UBE (Upper Arm Bike) --  BLE     Lumbar Exercises: Standing   Other  Standing Lumbar Exercises Posterior Pelvic Tilts: 1x15 in stance     Lumbar Exercises: Supine   Bent Knee Raise --  2x10, hips 90-120 degrees   Other Supine Lumbar Exercises Postural tolerance test on wedge x 5 minutes:   Postural tolerance test, feet in 3 postiions: hips 0, 45 80   Other Supine Lumbar Exercises @ 0* anterior thigh burning, 60* lateral hip pain, 80 degrees                 PT Education - 11/12/16 2049    Education provided Yes   Education Details Explained mechanical/postural exacerbation of spinal stenosis and how trunk flexor strengthening and better control of pelvic tilt in stant/gait will improve function/reduce symptoms.   Also: extensive educaiton on propper posturing for sleeping with Heart/lungs elevated for CHF. Trialed a wedge and discussed importance of avoiding excessive pillow use.    Person(s) Educated Patient   Methods Explanation;Demonstration;Handout   Comprehension Verbalized understanding;Tactile cues required;Returned demonstration          PT Short Term Goals - 11/02/16 2052      PT SHORT TERM GOAL #1   Title After 3 weeks pt will reports increased tolerance to AMB and prolonged standing by 100%.    Status New     PT SHORT TERM GOAL #2   Title After 3 weeks patient will demonstrate improved strength R ankle and knee AEB improved MMT by half a grade.    Status New           PT Long Term Goals - 11/02/16 2053      PT LONG TERM GOAL #1   Title After 6 weeks patient will demonstrate return to PLOF in IADL and community access AEB tolerance of 6MWT covering 1350 feet.    Status New     PT LONG TERM GOAL #2   Title After 6 weeks patient will demonstrate improved funcitonal strength AEB 5xSTS in <16 seconds hands free and pain free.    Status New     PT LONG TERM GOAL #3   Title After 6 weeks patient will demonstrate improved functional hip strength and stability AEB SLS >10sec bilat.    Status New               Plan  - 11/12/16 2054    Clinical Impression Statement Pt arrives with continues distress pertaining to continued disruption of sleep due to worsening pain at night, almost immediately upon getting into bed. Sleeping posutre is assessed with recommendations for improved posturing for CHF, as well as continued lumbar spine screening, which reveals worse symptoms while postured in lordosis, and near total relief of symptoms after 5-10 minutes in lumbar flexion posture. Pt is able to perform BLE strengthening activity in  this posture without aggravation of the Rt lateral hip or Rt anterior thigh burning. Based on information form evaluation visit, response to HEP established at second visit, and examination today, the patient's presentation is consistent with lumbar spinal stenosis multiple involved dermatonal levels on the Right side. Despite ROM limitations in the Rt femoroacetablular joint, general presentation is not consistent with this joint as a primary generator of the patient's dysfunciton. Special care will be taken in future sessions to avoid posutres of lordosis and/or ant pelvic tilt, and will focus on lumbar flexion based actiivties. Pt tolerating session well today, with full resolution of pain which mostly returned after 147f AMB to exit building. Pt making progress toward goals at this time.    Rehab Potential Fair   Clinical Impairments Affecting Rehab Potential Morbid obesity, history of spondylosis   PT Frequency 2x / week   PT Duration 6 weeks   PT Treatment/Interventions Balance training;Dry needling;Manual techniques;Passive range of motion;Therapeutic exercise;Therapeutic activities;Gait training;Moist Heat;Energy conservation;Functional mobility training   PT Next Visit Plan AVOID ALL POSTURAL EXTENSION: Continue with Nustep as flexion based activity/stretching for lumbar spine; Trunk rotation stretching in chair; Gentle 4 way hip A/ROM as tolerated, isolated strengtheing of Rt dorsiflexors  and Rt knee flexors.    PT Home Exercise Plan Asked to keep a journal on water intake. 3/6:  piriformis stretch (seated/supine), side lying clams, bridge (pt asked to DC any HEP actiivty that worsens radicular pain)    Consulted and Agree with Plan of Care Patient      Patient will benefit from skilled therapeutic intervention in order to improve the following deficits and impairments:  Abnormal gait, Improper body mechanics, Increased fascial restricitons, Pain, Postural dysfunction, Decreased activity tolerance, Decreased balance, Decreased range of motion, Decreased strength, Difficulty walking, Obesity  Visit Diagnosis: Pain in right hip  Stiffness of right hip, not elsewhere classified  Difficulty in walking, not elsewhere classified  Unsteadiness on feet     Problem List Patient Active Problem List   Diagnosis Date Noted  . Thrombocytopenia ([place] 11/04/2016  . Iron deficiency anemia due to chronic blood loss 01/31/2016  . Other specified hypothyroidism 09/20/2015  . Essential hypertension, benign 07/12/2015  . Morbid obesity with BMI of 40.0-44.9, adult ([place] 07/12/2015  . Hepatic cirrhosis ([place] 04/02/2014  . Acute colitis 03/05/2014  . Abdominal pain, acute, left lower quadrant 03/05/2014  . Colitis 03/05/2014  . Splenomegaly, congestive, chronic 02/17/2014  . Sepsis ([place] 02/15/2014  . Absolute anemia 02/15/2014  . Diabetes mellitus with stage 3 chronic kidney disease ([place] 02/15/2014  . CHF (congestive heart failure) ([place] 02/15/2014  . CKD (chronic kidney disease) 02/15/2014  . UTI (lower urinary tract infection) 02/15/2014   9:06 PM, 11/12/16 AEtta Grandchild PT, DPT Physical Therapist - [phone])   [place] [address]Phone: [phone]  Fax:  [phone] Name: Penny PIPPINSMRN: [id number] of Birth: 106/07/1951

## 2016-11-17 ENCOUNTER — Ambulatory Visit (HOSPITAL_COMMUNITY): Payer: Medicare Other

## 2016-11-19 ENCOUNTER — Ambulatory Visit (HOSPITAL_COMMUNITY): Payer: Medicare Other

## 2016-11-19 ENCOUNTER — Ambulatory Visit: Payer: Medicare Other | Admitting: Orthopaedic Surgery

## 2016-11-19 DIAGNOSIS — M25551 Pain in right hip: Secondary | ICD-10-CM | POA: Diagnosis not present

## 2016-11-19 DIAGNOSIS — R262 Difficulty in walking, not elsewhere classified: Secondary | ICD-10-CM

## 2016-11-19 DIAGNOSIS — M25651 Stiffness of right hip, not elsewhere classified: Secondary | ICD-10-CM

## 2016-11-19 DIAGNOSIS — R2681 Unsteadiness on feet: Secondary | ICD-10-CM

## 2016-11-19 NOTE — Therapy (Signed)
[place], [place], [address] Phone: [phone]   Fax:  [phone]  Physical Therapy Treatment  Patient Details  Name: Penny Martinez MRN: [id number] Date of Birth: [birth year] Referring Provider: Sanjuana Kava  Encounter Date: 11/19/2016      PT End of Session - 11/19/16 1446    Visit Number 4   Number of Visits 12   Date for PT Re-Evaluation 11/23/16   Authorization Type Medicare (traditional)    Authorization Time Period 11/02/16-12/14/16   Authorization - Visit Number 4   Authorization - Number of Visits 10   PT Start Time 1430   PT Stop Time 1510   PT Time Calculation (min) 40 min   Activity Tolerance Patient tolerated treatment well;No increased pain;Patient limited by pain   Behavior During Therapy [facility] for tasks assessed/performed      Past Medical History:  Diagnosis Date  . Acid reflux   . C. difficile colitis June/July 2015  . Cancer ([place])   . CHF (congestive heart failure) ([place])   . 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 Negative viral markers 2015  . Complication of anesthesia   . COPD (chronic obstructive pulmonary disease) ([place])   . Diabetes mellitus without complication ([place])   . GERD (gastroesophageal reflux disease)   . Hypercholesteremia   . Hypertension   . IBS (irritable bowel syndrome)   . Kidney disease   . Liver disease   . Thyroid disease     Past Surgical History:  Procedure Laterality Date  . ABDOMINAL HYSTERECTOMY    . APPENDECTOMY    . 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 CARPAL TUNNEL RELEASE    . CATARACT EXTRACTION    . CHOLECYSTECTOMY    . COLONOSCOPY N/A 02/19/2014   Dr. Gala Romney: rectal varices, rectal polyp overlying a varix non-manipulated  . ESOPHAGOGASTRODUODENOSCOPY N/A 02/19/2014   Dr. Gala Romney: esophageal varices, multiple gastric polyps with largest biopsied and hyperplastic. Negative H.pylori  . GIVENS CAPSULE STUDY N/A 05/04/2014   multiple polypoid-like lesions throughout small bowel, scattered superficial non-bleeding erosions more distal bowel, referred to [facility] for [facility]oscopy. Capsule study not complete, poor images.   . urosepsis      There were no vitals filed for this visit.      Subjective Assessment - 11/19/16 1433    Subjective Pt reports good reduction after last session, up until about Sunday, and she has been unable to decrease pain since. She notices that as pain increases so does her weakness and buckling in the RLE. She acquired a wedge pillow for overnigh posture related to CHF, and has been happy with it. She will continue to look for a pillow to elevate her legs at home.    Currently in Pain? Yes   Pain Score 7    Pain Location --  RLE hip to knee;    Pain Orientation Right   Pain Descriptors / Indicators Burning   Pain Onset 1 to 4 weeks ago                         [facility] Adult PT Treatment/Exercise - 11/19/16 0001      Lumbar Exercises: Stretches   Double Knee to Chest Stretch 1 rep  1x5 minutes, gradual reduction in Sx from knee to GreaterTro   Lower Trunk Rotation 3 reps;30 seconds  chair rotation to Right side only  Lumbar Exercises: Seated   Hip Flexion on Ball Limitations on chair: 2x10    Sit to Stand Other (comment)  seated forward flexion: 2x10      Lumbar Exercises: Supine   Bent Knee Raise Other (comment)   Bent Knee Raise Limitations 3x10 bilat    Bridge 10 reps  3x10           Trigger Point Dry Needling - 11/19/16 1915    Consent Given? Yes   Education Handout Provided Yes   Muscles Treated Lower Body Quadriceps   Quadriceps Response Twitch response elicited;Palpable increased muscle length  Right Lumbar Multifidus L3/L4                PT Short Term Goals - 11/02/16 2052      PT SHORT TERM GOAL #1   Title After 3 weeks pt will reports increased tolerance to AMB and prolonged standing by 100%.    Status New     PT SHORT TERM  GOAL #2   Title After 3 weeks patient will demonstrate improved strength R ankle and knee AEB improved MMT by half a grade.    Status New           PT Long Term Goals - 11/02/16 2053      PT LONG TERM GOAL #1   Title After 6 weeks patient will demonstrate return to PLOF in IADL and community access AEB tolerance of 6MWT covering 1350 feet.    Status New     PT LONG TERM GOAL #2   Title After 6 weeks patient will demonstrate improved funcitonal strength AEB 5xSTS in <16 seconds hands free and pain free.    Status New     PT LONG TERM GOAL #3   Title After 6 weeks patient will demonstrate improved functional hip strength and stability AEB SLS >10sec bilat.    Status New               Plan - 11/19/16 1916    Clinical Impression Statement Top priority continues to be improving self-management of radicular symptoms to home. Radicular RLE symptoms should be centralized each session as able prior to strengthening program. Treatment continues to follow along flexion based protocol, during which patient demonstrates again, centralization of radicular symptoms in RLE after 2-5 minutes of sustained lumbar flexion stretching, and focal concentration of paoin at the Right low back after subsequent repeated flexion activity. Chair rotational stretches trials this session to determine efficacy in Rt sided foraminal opening, and issued as HEP update. Core strengthening of trunk flexors continues. At end of session, trigger point dry needling used to address tightness in right sided lumbar multifidus, performed quickly due to patient's poor tolerance of prone. No changes to POC at this time. Pt continues to make progress toward goals.    Rehab Potential Fair   Clinical Impairments Affecting Rehab Potential Morbid obesity, history of spondylosis   PT Frequency 2x / week   PT Duration 6 weeks   PT Treatment/Interventions Balance training;Dry needling;Manual techniques;Passive range of  motion;Therapeutic exercise;Therapeutic activities;Gait training;Moist Heat;Energy conservation;Functional mobility training   PT Next Visit Plan AVOID ALL POSTURAL LUMBAR EXTENSION: Cont. c trunk rotation stretching in chair to RIGHT side only; Continue with current flexion based program. Begin additional of some thoracic kyphosis corrective exercise (rows, W, X/V)     PT Home Exercise Plan Asked to keep a journal on water intake. 3/6:  piriformis stretch (seated/supine), side lying clams, bridge (pt asked to DC  any HEP actiivty that worsens radicular pain); Right chair rotation stretch.    Consulted and Agree with Plan of Care Patient      Patient will benefit from skilled therapeutic intervention in order to improve the following deficits and impairments:  Abnormal gait, Improper body mechanics, Increased fascial restricitons, Pain, Postural dysfunction, Decreased activity tolerance, Decreased balance, Decreased range of motion, Decreased strength, Difficulty walking, Obesity  Visit Diagnosis: Pain in right hip  Stiffness of right hip, not elsewhere classified  Difficulty in walking, not elsewhere classified  Unsteadiness on feet     Problem List Patient Active Problem List   Diagnosis Date Noted  . Thrombocytopenia ([place]) 11/04/2016  . Iron deficiency anemia due to chronic blood loss 01/31/2016  . Other specified hypothyroidism 09/20/2015  . Essential hypertension, benign 07/12/2015  . Morbid obesity with BMI of 40.0-44.9, adult ([place]) 07/12/2015  . Hepatic cirrhosis ([place]) 04/02/2014  . Acute colitis 03/05/2014  . Abdominal pain, acute, left lower quadrant 03/05/2014  . Colitis 03/05/2014  . Splenomegaly, congestive, chronic 02/17/2014  . Sepsis ([place]) 02/15/2014  . Absolute anemia 02/15/2014  . Diabetes mellitus with stage 3 chronic kidney disease ([place]) 02/15/2014  . CHF (congestive heart failure) ([place]) 02/15/2014  . CKD (chronic kidney disease) 02/15/2014  . UTI (lower  urinary tract infection) 02/15/2014   7:26 PM, 11/19/16 Etta Grandchild, PT, DPT Physical Therapist at [facility] [phone] (office)     [facility] [place], [place], [address] Phone: [phone]   Fax:  [phone]  Name: Penny Martinez MRN: [id number] Date of Birth: [birth year]

## 2016-11-19 NOTE — Patient Instructions (Signed)

## 2016-11-24 ENCOUNTER — Ambulatory Visit (HOSPITAL_COMMUNITY): Payer: Medicare Other

## 2016-11-24 ENCOUNTER — Ambulatory Visit (INDEPENDENT_AMBULATORY_CARE_PROVIDER_SITE_OTHER): Payer: Medicare Other | Admitting: Orthopaedic Surgery

## 2016-11-24 VITALS — BP 124/69 | HR 79 | Temp 97.7°F | Ht 62.0 in | Wt 228.0 lb

## 2016-11-24 DIAGNOSIS — M25551 Pain in right hip: Secondary | ICD-10-CM

## 2016-11-24 DIAGNOSIS — M5441 Lumbago with sciatica, right side: Secondary | ICD-10-CM

## 2016-11-24 DIAGNOSIS — R2681 Unsteadiness on feet: Secondary | ICD-10-CM

## 2016-11-24 DIAGNOSIS — R262 Difficulty in walking, not elsewhere classified: Secondary | ICD-10-CM

## 2016-11-24 DIAGNOSIS — M25651 Stiffness of right hip, not elsewhere classified: Secondary | ICD-10-CM

## 2016-11-24 NOTE — Therapy (Signed)
[place], [place], [address] Phone: [phone]   Fax:  [phone]  Physical Therapy Treatment  Patient Details  Name: Penny Martinez MRN: [id number] Date of Birth: 1951-07-19 Referring Provider: Sanjuana Kava  Encounter Date: 11/24/2016      PT End of Session - 11/24/16 1312    Visit Number 5   Number of Visits 12   Date for PT Re-Evaluation 11/23/16   Authorization Type Medicare (traditional)    Authorization Time Period 11/02/16-12/14/16   Authorization - Visit Number 5   Authorization - Number of Visits 10   PT Start Time 9417   PT Stop Time 1346   PT Time Calculation (min) 44 min   Activity Tolerance Patient tolerated treatment well;No increased pain  Reports of pain reduced and no radicular symptoms at EOS   Behavior During Therapy [facility] for tasks assessed/performed      Past Medical History:  Diagnosis Date  . Acid reflux   . C. difficile colitis June/July 2015  . Cancer ([place])   . CHF (congestive heart failure) ([place])   . 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 Negative viral markers 2015  . Complication of anesthesia   . COPD (chronic obstructive pulmonary disease) ([place])   . Diabetes mellitus without complication ([place])   . GERD (gastroesophageal reflux disease)   . Hypercholesteremia   . Hypertension   . IBS (irritable bowel syndrome)   . Kidney disease   . Liver disease   . Thyroid disease     Past Surgical History:  Procedure Laterality Date  . ABDOMINAL HYSTERECTOMY    . APPENDECTOMY    . 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 CARPAL TUNNEL RELEASE    . CATARACT EXTRACTION    . CHOLECYSTECTOMY    . COLONOSCOPY N/A 02/19/2014   Dr. Gala Romney: rectal varices, rectal polyp overlying a varix non-manipulated  . ESOPHAGOGASTRODUODENOSCOPY N/A 02/19/2014   Dr. Gala Romney: esophageal varices, multiple gastric polyps with largest biopsied and hyperplastic. Negative H.pylori  . GIVENS  CAPSULE STUDY N/A 05/04/2014   multiple polypoid-like lesions throughout small bowel, scattered superficial non-bleeding erosions more distal bowel, referred to [facility] for enteroscopy. Capsule study not complete, poor images.   . urosepsis      There were no vitals filed for this visit.      Subjective Assessment - 11/24/16 1306    Subjective Pt stated she was doing better until she had an episode wiht increased radicular symptoms down Rt anterior LE down to toes.  Reports positive results with dry needling.     How long can you sit comfortably? 03/20: able to sit for about an hour having to change weight bearing ofter; (eval was About 1 hour. )   How long can you stand comfortably? 03/20:  Able to stand after a few minutes, is able to tolerate standing long enough to shower then requires a sit.  (Eval Estimated about 10-12 minutes (long enough to shower)    How long can you walk comfortably? 03/20:  the same.  (on eval was About 30-45 minutes for grocery shopping with excruciating pain thereafter.)   Patient Stated Goals Get back to normal like.   Currently in Pain? Yes   Pain Score 7    Pain Location Back  lower back to Rt hip down Rt LE ending lateral ankle   Pain Orientation Right   Pain Descriptors / Indicators Burning   Pain  Type Acute pain   Pain Onset 1 to 4 weeks ago   Pain Frequency Intermittent   Aggravating Factors  standing/walking prolonged periods; steps/stairs; increased stiffness after sleeping overnight   Pain Relieving Factors sitting intermittently for short periods                         [facility] Adult PT Treatment/Exercise - 11/24/16 0001      Lumbar Exercises: Stretches   Lower Trunk Rotation Limitations seated Rt side rotation only 3x 30"     Lumbar Exercises: Seated   LAQ on Chair Limitations rows, W, and XtoY 10x reps each   Sit to Stand Other (comment)  seated foward flexion 10x     Lumbar Exercises: Supine   Bent Knee Raise Limitations  3x10 bilat    Bridge 10 reps  3x 10                PT Education - 11/24/16 1313    Education provided Yes   Education Details Educated importance of posture strengthening for pain control   Person(s) Educated Patient   Methods Demonstration;Explanation  visual cueing   Comprehension Verbalized understanding;Returned demonstration;Need further instruction          PT Short Term Goals - 11/02/16 2052      PT SHORT TERM GOAL #1   Title After 3 weeks pt will reports increased tolerance to AMB and prolonged standing by 100%.    Status New     PT SHORT TERM GOAL #2   Title After 3 weeks patient will demonstrate improved strength R ankle and knee AEB improved MMT by half a grade.    Status New           PT Long Term Goals - 11/02/16 2053      PT LONG TERM GOAL #1   Title After 6 weeks patient will demonstrate return to PLOF in IADL and community access AEB tolerance of 6MWT covering 1350 feet.    Status New     PT LONG TERM GOAL #2   Title After 6 weeks patient will demonstrate improved funcitonal strength AEB 5xSTS in <16 seconds hands free and pain free.    Status New     PT LONG TERM GOAL #3   Title After 6 weeks patient will demonstrate improved functional hip strength and stability AEB SLS >10sec bilat.    Status New               Plan - 11/24/16 1323    Clinical Impression Statement Continued session focus on reducing radicular symptoms with therex based on proximal strengthening and education on importance of posture.  Pt able to demonstrate and verbalize importance though required cueing to improve awareness of posture over 3 minutes due to weakness.  Added posture therex to address thoracic kyphosis, did avoid all lumbar extension based exercises.  Reviewed subjective tolerance for sitting, standing and gait, pt stated minimal changes since initial eval.  Does continues to be limited by pain and weakness.  EOS pt reports pain reduced and no radicular  symptoms.   Rehab Potential Fair   Clinical Impairments Affecting Rehab Potential Morbid obesity, history of spondylosis   PT Frequency 2x / week   PT Duration 6 weeks   PT Treatment/Interventions Balance training;Dry needling;Manual techniques;Passive range of motion;Therapeutic exercise;Therapeutic activities;Gait training;Moist Heat;Energy conservation;Functional mobility training   PT Next Visit Plan Reassess next session.  AVOID ALL POSTURAL LUMBAR EXTENSION: Cont. c  trunk rotation stretching in chair to RIGHT side only; Continue with current flexion based program. Continue additional of some thoracic kyphosis corrective exercise (rows, W, X/V)     PT Home Exercise Plan Asked to keep a journal on water intake. 3/6:  piriformis stretch (seated/supine), side lying clams, bridge (pt asked to DC any HEP actiivty that worsens radicular pain); Right chair rotation stretch.       Patient will benefit from skilled therapeutic intervention in order to improve the following deficits and impairments:  Abnormal gait, Improper body mechanics, Increased fascial restricitons, Pain, Postural dysfunction, Decreased activity tolerance, Decreased balance, Decreased range of motion, Decreased strength, Difficulty walking, Obesity  Visit Diagnosis: Pain in right hip  Stiffness of right hip, not elsewhere classified  Difficulty in walking, not elsewhere classified  Unsteadiness on feet     Problem List Patient Active Problem List   Diagnosis Date Noted  . Thrombocytopenia ([place]) 11/04/2016  . Iron deficiency anemia due to chronic blood loss 01/31/2016  . Other specified hypothyroidism 09/20/2015  . Essential hypertension, benign 07/12/2015  . Morbid obesity with BMI of 40.0-44.9, adult ([place]) 07/12/2015  . Hepatic cirrhosis ([place]) 04/02/2014  . Acute colitis 03/05/2014  . Abdominal pain, acute, left lower quadrant 03/05/2014  . Colitis 03/05/2014  . Splenomegaly, congestive, chronic 02/17/2014  .  Sepsis ([place]) 02/15/2014  . Absolute anemia 02/15/2014  . Diabetes mellitus with stage 3 chronic kidney disease ([place]) 02/15/2014  . CHF (congestive heart failure) ([place]) 02/15/2014  . CKD (chronic kidney disease) 02/15/2014  . UTI (lower urinary tract infection) 02/15/2014   Ihor Austin, [place]; [place]  Aldona Lento 11/24/2016, 7:07 PM  [place], [place], [address] Phone: [phone]   Fax:  [phone]  Name: Penny Martinez MRN: [id number] Date of Birth: Aug 02, 1951

## 2016-11-24 NOTE — Progress Notes (Signed)
Patient WF:UXNATF Penny Martinez, female DOB:May 23, 1951, 66 y.o. TDD:[id number]  Chief Complaint  Patient presents with  . Follow-up    low back pain    HPI  ARBELL WYCOFF is a 66 y.o. female who has lower back pain with right sided sciatica.  She has been to PT and is improved.  She is moving about better and has less paresthesias.  She has no new trauma.  She feels better.  I told her to continue the PT and have recommended walking exercises. HPI  Body mass index is 41.7 kg/m.  ROS  Review of Systems  HENT: Negative for congestion.   Respiratory: Positive for shortness of breath. Negative for cough.   Cardiovascular: Negative for chest pain and leg swelling.  Endocrine: Positive for cold intolerance.  Musculoskeletal: Positive for arthralgias and joint swelling.  Allergic/Immunologic: Positive for environmental allergies.    Past Medical History:  Diagnosis Date  . Acid reflux   . C. difficile colitis June/July 2015  . Cancer ([place])   . CHF (congestive heart failure) ([place])   . 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 Negative viral markers 2015  . Complication of anesthesia   . COPD (chronic obstructive pulmonary disease) ([place])   . Diabetes mellitus without complication ([place])   . GERD (gastroesophageal reflux disease)   . Hypercholesteremia   . Hypertension   . IBS (irritable bowel syndrome)   . Kidney disease   . Liver disease   . Thyroid disease     Past Surgical History:  Procedure Laterality Date  . ABDOMINAL HYSTERECTOMY    . APPENDECTOMY    . 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 CARPAL TUNNEL RELEASE    . CATARACT EXTRACTION    . CHOLECYSTECTOMY    . COLONOSCOPY N/A 02/19/2014   Dr. Gala Romney: rectal varices, rectal polyp overlying a varix non-manipulated  . ESOPHAGOGASTRODUODENOSCOPY N/A 02/19/2014   Dr. Gala Romney: esophageal varices, multiple gastric polyps with largest biopsied and hyperplastic. Negative H.pylori  . GIVENS CAPSULE STUDY N/A  05/04/2014   multiple polypoid-like lesions throughout small bowel, scattered superficial non-bleeding erosions more distal bowel, referred to [facility] for [facility]oscopy. Capsule study not complete, poor images.   . urosepsis      Family History  Problem Relation Age of Onset  . Diabetes Mother   . Hypertension Mother   . Kidney failure Mother   . Blindness Mother   . Aneurysm Father   . Colon cancer Neg Hx     Social History Social History  Substance Use Topics  . Smoking status: Former Smoker    Packs/day: 2.00    Years: 35.00    Quit date: 05/05/2003  . Smokeless tobacco: Former Systems developer     Comment: Quit smoking 11 years ago  . Alcohol use No    Allergies  Allergen Reactions  . Erythromycin Hives  . Niacin Rash and Shortness Of Breath  . Penicillins Shortness Of Breath  . Sulfa Antibiotics Hives  . Statins     Other reaction(s): Muscle Pain Other reaction(s): Muscle Pain    Current Outpatient Prescriptions  Medication Sig Dispense Refill  . amitriptyline (ELAVIL) 25 MG tablet Take 25 mg by mouth at bedtime.    Marland Kitchen aspirin 81 MG tablet Take 81 mg by mouth daily.    . baclofen (LIORESAL) 10 MG tablet Take 10 mg by mouth 3 (three) times daily.    . benazepril (LOTENSIN) 20 MG tablet Take 20 mg by mouth  daily.    Marland Kitchen CRANBERRY-VITAMIN C PO Take 2 capsules by mouth at bedtime. Dosage is the 25,200 mg cranberry/vitamin C supplement    . diphenhydrAMINE (SOMINEX) 25 MG tablet Take by mouth at bedtime. Reported on 01/31/2016    . diphenoxylate-atropine (LOMOTIL) 2.5-0.025 MG tablet 1-2 tablets every 4-6 hours as needed for diarrhea. 120 tablet 3  . ezetimibe (ZETIA) 10 MG tablet Take 10 mg by mouth daily.    . furosemide (LASIX) 80 MG tablet Take 80 mg by mouth daily.    Marland Kitchen gabapentin (NEURONTIN) 100 MG capsule TAKE (1) CAPSULE BY MOUTH THREE TIMES DAILY 90 capsule 2  . HUMULIN R U-500 KWIKPEN 500 UNIT/ML kwikpen INJECT 130 UNITS S.Q. THREE TIMES DAILY WITH MEALS. 18 mL 2  .  lansoprazole (PREVACID) 30 MG capsule Take 30 mg by mouth daily.    Marland Kitchen levothyroxine (SYNTHROID, LEVOTHROID) 100 MCG tablet Take 1 tablet (100 mcg total) by mouth daily. 90 tablet 1  . Multiple Vitamins-Minerals (CENTRUM SILVER PO) Take 1 tablet by mouth daily.     . nitrofurantoin, macrocrystal-monohydrate, (MACROBID) 100 MG capsule Take 100 mg by mouth at bedtime.  1  . potassium chloride (K-DUR) 10 MEQ tablet Take 10 mEq by mouth daily.    . predniSONE (STERAPRED UNI-PAK 21 TAB) 5 MG (21) TBPK tablet Take 6 pills first day; 5 pills second day; 4 pills third day; 3 pills fourth day; 2 pills next day and 1 pill last day. 21 tablet 0  . propranolol (INDERAL) 10 MG tablet Take 2 tablets (20 mg total) by mouth daily. 60 tablet 11  . ULTICARE SHORT PEN NEEDLES 31G X 8 MM MISC USE AS DIRECTED THREE TIMES DAILY. 100 each 5  . VICTOZA 18 MG/3ML SOPN INJECT 1.8 MG S.Q. ONCE DAILY. 9 mL 2   No current facility-administered medications for this visit.      Physical Exam  Blood pressure 124/69, pulse 79, temperature 97.7 F (36.5 C), height 5' 2"  (1.575 m), weight 228 lb (103.4 kg).  Constitutional: overall normal hygiene, normal nutrition, well developed, normal grooming, normal body habitus. Assistive device:none  Musculoskeletal: gait and station Limp none, muscle tone and strength are normal, no tremors or atrophy is present.  .  Neurological: coordination overall normal.  Deep tendon reflex/nerve stretch intact.  Sensation normal.  Cranial nerves II-XII intact.   Skin:   Normal overall no scars, lesions, ulcers or rashes. No psoriasis.  Psychiatric: Alert and oriented x 3.  Recent memory intact, remote memory unclear.  Normal mood and affect. Well groomed.  Good eye contact.  Cardiovascular: overall no swelling, no varicosities, no edema bilaterally, normal temperatures of the legs and arms, no clubbing, cyanosis and good capillary refill.  Lymphatic: palpation is normal.  Spine/Pelvis  examination:  Inspection:  Overall, sacoiliac joint benign and hips nontender; without crepitus or defects.   Thoracic spine inspection: Alignment normal without kyphosis present   Lumbar spine inspection:  Alignment  with normal lumbar lordosis, without scoliosis apparent.   Thoracic spine palpation:  without tenderness of spinal processes   Lumbar spine palpation: with tenderness of lumbar area; without tightness of lumbar muscles    Range of Motion:   Lumbar flexion, forward flexion is 45 without pain or tenderness    Lumbar extension is 10 without pain or tenderness   Left lateral bend is Normal  without pain or tenderness   Right lateral bend is Normal without pain or tenderness   Straight leg raising is Normal  Strength & tone: Normal   Stability overall normal stability     The patient has been educated about the nature of the problem(s) and counseled on treatment options.  The patient appeared to understand what I have discussed and is in agreement with it.  Encounter Diagnosis  Name Primary?  . Acute right-sided low back pain with right-sided sciatica Yes    PLAN Call if any problems.  Precautions discussed.  Continue current medications.   Return to clinic 1 month   Continue PT and do walking.  Electronically Signed Sanjuana Kava, MD 3/20/20182:50 PM

## 2016-11-26 ENCOUNTER — Encounter (HOSPITAL_COMMUNITY): Payer: Self-pay

## 2016-11-26 ENCOUNTER — Ambulatory Visit (HOSPITAL_COMMUNITY): Payer: Medicare Other

## 2016-11-26 ENCOUNTER — Other Ambulatory Visit: Payer: Self-pay | Admitting: "Endocrinology

## 2016-11-26 DIAGNOSIS — M25551 Pain in right hip: Secondary | ICD-10-CM

## 2016-11-26 DIAGNOSIS — M25651 Stiffness of right hip, not elsewhere classified: Secondary | ICD-10-CM

## 2016-11-26 DIAGNOSIS — R262 Difficulty in walking, not elsewhere classified: Secondary | ICD-10-CM

## 2016-11-26 DIAGNOSIS — R2681 Unsteadiness on feet: Secondary | ICD-10-CM

## 2016-11-26 NOTE — Therapy (Signed)
[place], [place], [address] Phone: [phone]   Fax:  [phone]  Physical Therapy Treatment  Patient Details  Name: Penny Martinez MRN: [id number] Date of Birth: 08/29/1951 Referring Provider: Sanjuana Kava  Encounter Date: 11/26/2016      PT End of Session - 11/26/16 1151    Visit Number 6   Number of Visits 12   Date for PT Re-Evaluation 11/23/16   Authorization Type Medicare (traditional)    Authorization Time Period 11/02/16-12/14/16   Authorization - Visit Number 6   Authorization - Number of Visits 10   PT Start Time 1540   PT Stop Time 0867   PT Time Calculation (min) 41 min   Activity Tolerance Patient tolerated treatment well;No increased pain  Reports of pain reduced and no radicular symptoms at EOS   Behavior During Therapy [facility] for tasks assessed/performed      Past Medical History:  Diagnosis Date  . Acid reflux   . C. difficile colitis June/July 2015  . Cancer ([place])   . CHF (congestive heart failure) ([place])   . 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 Negative viral markers 2015  . Complication of anesthesia   . COPD (chronic obstructive pulmonary disease) ([place])   . Diabetes mellitus without complication ([place])   . GERD (gastroesophageal reflux disease)   . Hypercholesteremia   . Hypertension   . IBS (irritable bowel syndrome)   . Kidney disease   . Liver disease   . Thyroid disease     Past Surgical History:  Procedure Laterality Date  . ABDOMINAL HYSTERECTOMY    . APPENDECTOMY    . 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 CARPAL TUNNEL RELEASE    . CATARACT EXTRACTION    . CHOLECYSTECTOMY    . COLONOSCOPY N/A 02/19/2014   Dr. Gala Romney: rectal varices, rectal polyp overlying a varix non-manipulated  . ESOPHAGOGASTRODUODENOSCOPY N/A 02/19/2014   Dr. Gala Romney: esophageal varices, multiple gastric polyps with largest biopsied and hyperplastic. Negative H.pylori  . GIVENS  CAPSULE STUDY N/A 05/04/2014   multiple polypoid-like lesions throughout small bowel, scattered superficial non-bleeding erosions more distal bowel, referred to [facility] enteroscopy. Capsule study not complete, poor images.   . urosepsis      There were no vitals filed for this visit.      Subjective Assessment - 11/26/16 1126    Subjective Pt states that her LBP woke her up this morning and she was not able to go back to sleep. Prior to this morning though she had started to feel better.    How long can you sit comfortably? 03/20: able to sit for about an hour having to change weight bearing ofter; (eval was About 1 hour. )   How long can you stand comfortably? 03/20:  Able to stand after a few minutes, is able to tolerate standing long enough to shower then requires a sit.  (Eval Estimated about 10-12 minutes (long enough to shower)    How long can you walk comfortably? 03/20:  the same.  (on eval was About 30-45 minutes for grocery shopping with excruciating pain thereafter.)   Patient Stated Goals Get back to normal like.   Currently in Pain? Yes   Pain Score 8    Pain Location Back  & R hip   Pain Orientation Right   Pain Descriptors / Indicators Burning;Aching   Pain Type Acute pain   Pain Onset  1 to 4 weeks ago              [facility] Adult PT Treatment/Exercise - 11/26/16 0001      Lumbar Exercises: Stretches   Double Knee to Chest Stretch 3 reps;60 seconds   Double Knee to Chest Stretch Limitations with wedge under BLE to assist    Pelvic Tilt 3 reps  x 2 min each   Pelvic Tilt Limitations seated fwd flexion stretch, alleviated R hip pain   Piriformis Stretch 3 reps;30 seconds   Piriformis Stretch Limitations manual SKTC and piriformis stretch after ambulating to parking lot and back     Lumbar Exercises: Standing   Other Standing Lumbar Exercises pull downs with RTB with 1 foot elevated on 4" step, 2x10 BLE     Lumbar Exercises: Seated   LAQ on Chair Limitations  bil rowing with RTB while seated, 3x10, cues to decrease UT elevation     Lumbar Exercises: Supine   Other Supine Lumbar Exercises deadbugs 1x10 with diaphragmatic breathing                PT Education - 11/26/16 1208    Education provided Yes   Education Details exercise technique, continue HEP   Person(s) Educated Patient   Methods Explanation;Demonstration   Comprehension Verbalized understanding;Returned demonstration          PT Short Term Goals - 11/02/16 2052      PT SHORT TERM GOAL #1   Title After 3 weeks pt will reports increased tolerance to AMB and prolonged standing by 100%.    Status New     PT SHORT TERM GOAL #2   Title After 3 weeks patient will demonstrate improved strength R ankle and knee AEB improved MMT by half a grade.    Status New           PT Long Term Goals - 11/02/16 2053      PT LONG TERM GOAL #1   Title After 6 weeks patient will demonstrate return to PLOF in IADL and community access AEB tolerance of 6MWT covering 1350 feet.    Status New     PT LONG TERM GOAL #2   Title After 6 weeks patient will demonstrate improved funcitonal strength AEB 5xSTS in <16 seconds hands free and pain free.    Status New     PT LONG TERM GOAL #3   Title After 6 weeks patient will demonstrate improved functional hip strength and stability AEB SLS >10sec bilat.    Status New               Plan - 11/26/16 1209    Clinical Impression Statement PT session limited this date due to fire drill occurring during session. Pt presented to therapy with c/o 7/10 R low back/R hip pain so began session with DKTC stretching which helped reduce symptoms. Introduced pt to deadbugs to promote flexion as well as core strengthening which pt tolerated well. After having to walk outside and back during the fire drill, pt had increased R hip pain; PT performed manual SKTC/piriformis stretching which helped reduce symptoms though not completely. Had pt perform  sustained seated trunk flexion stretch which alleviated R hip pain. Pt tolerated standing core stabilization exercises well, but did have some R hip pain. However, she had decreased pain overall to 5/10 at EOS. Continue POC as planned.   Rehab Potential Fair   Clinical Impairments Affecting Rehab Potential Morbid obesity, history of spondylosis   PT  Frequency 2x / week   PT Duration 6 weeks   PT Treatment/Interventions Balance training;Dry needling;Manual techniques;Passive range of motion;Therapeutic exercise;Therapeutic activities;Gait training;Moist Heat;Energy conservation;Functional mobility training   PT Next Visit Plan Reassess next session.  AVOID ALL POSTURAL LUMBAR EXTENSION: Cont. c trunk rotation stretching in chair to RIGHT side only; Continue with current flexion based program. Continue additional of some thoracic kyphosis corrective exercise (rows, W, X/V)     PT Home Exercise Plan Asked to keep a journal on water intake. 3/6:  piriformis stretch (seated/supine), side lying clams, bridge (pt asked to DC any HEP actiivty that worsens radicular pain); Right chair rotation stretch.    Consulted and Agree with Plan of Care Patient      Patient will benefit from skilled therapeutic intervention in order to improve the following deficits and impairments:  Abnormal gait, Improper body mechanics, Increased fascial restricitons, Pain, Postural dysfunction, Decreased activity tolerance, Decreased balance, Decreased range of motion, Decreased strength, Difficulty walking, Obesity  Visit Diagnosis: Pain in right hip  Stiffness of right hip, not elsewhere classified  Difficulty in walking, not elsewhere classified  Unsteadiness on feet     Problem List Patient Active Problem List   Diagnosis Date Noted  . Thrombocytopenia ([place]) 11/04/2016  . Iron deficiency anemia due to chronic blood loss 01/31/2016  . Other specified hypothyroidism 09/20/2015  . Essential hypertension, benign  07/12/2015  . Morbid obesity with BMI of 40.0-44.9, adult ([place]) 07/12/2015  . Hepatic cirrhosis ([place]) 04/02/2014  . Acute colitis 03/05/2014  . Abdominal pain, acute, left lower quadrant 03/05/2014  . Colitis 03/05/2014  . Splenomegaly, congestive, chronic 02/17/2014  . Sepsis ([place]) 02/15/2014  . Absolute anemia 02/15/2014  . Diabetes mellitus with stage 3 chronic kidney disease ([place]) 02/15/2014  . CHF (congestive heart failure) ([place]) 02/15/2014  . CKD (chronic kidney disease) 02/15/2014  . UTI (lower urinary tract infection) 02/15/2014     Geraldine Solar PT, DPT    [place] [address] [place], [place], [address] Phone: [phone]   Fax:  [phone]  Name: ISABEAU MCCALLA MRN: [id number] Date of Birth: [birth year]

## 2016-11-27 NOTE — Patient Instructions (Signed)
Penny Martinez  11/27/2016     @PREFPERIOPPHARMACY @   Your procedure is scheduled on 12/03/2016.  Report to [name] at 7:15 A.M.  Call this number if you have problems the morning of surgery:  [phone]   Remember:  Do not eat food or drink liquids after midnight.  Take these medicines the morning of surgery with A SIP OF WATER Baclofen, Lotensin, gabapentin, synthroid, prevacid, inderal   Do not wear jewelry, make-up or nail polish.  Do not wear lotions, powders, or perfumes, or deoderant.  Do not shave 48 hours prior to surgery.  Men may shave face and neck.  Do not bring valuables to the hospital.  [facility] is not responsible for any belongings or valuables.  Contacts, dentures or bridgework may not be worn into surgery.  Leave your suitcase in the car.  After surgery it may be brought to your room.  For patients admitted to the hospital, discharge time will be determined by your treatment team.  Patients discharged the day of surgery will not be allowed to drive home.    Please read over the following fact sheets that you were given. Surgical Site Infection Prevention and Anesthesia Post-op Instructions     PATIENT INSTRUCTIONS POST-ANESTHESIA  IMMEDIATELY FOLLOWING SURGERY:  Do not drive or operate machinery for the first twenty four hours after surgery.  Do not make any important decisions for twenty four hours after surgery or while taking narcotic pain medications or sedatives.  If you develop intractable nausea and vomiting or a severe headache please notify your doctor immediately.  FOLLOW-UP:  Please make an appointment with your surgeon as instructed. You do not need to follow up with anesthesia unless specifically instructed to do so.  WOUND CARE INSTRUCTIONS (if applicable):  Keep a dry clean dressing on the anesthesia/puncture wound site if there is drainage.  Once the wound has quit draining you may leave it open to air.  Generally you should  leave the bandage intact for twenty four hours unless there is drainage.  If the epidural site drains for more than 36-48 hours please call the anesthesia department.  QUESTIONS?:  Please feel free to call your physician or the hospital operator if you have any questions, and they will be happy to assist you.      Trigger Finger Trigger finger (stenosing tenosynovitis) is a condition that causes a finger to get stuck in a bent position. Each finger has a tough, cord-like tissue that connects muscle to bone (tendon), and each tendon is surrounded by a tunnel of tissue (tendon sheath). To move your finger, your tendon needs to slide freely through the sheath. Trigger finger happens when the tendon or the sheath thickens, making it difficult to move your finger. Trigger finger can affect any finger or a thumb. It may affect more than one finger. Mild cases may clear up with rest and medicine. Severe cases require more treatment. What are the causes? Trigger finger is caused by a thickened finger tendon or tendon sheath. The cause of this thickening is not known. What increases the risk? The following factors may make you more likely to develop this condition:  Doing activities that require a strong grip.  Having rheumatoid arthritis, gout, or diabetes.  Being 71-62 years old.  Being a woman. What are the signs or symptoms? Symptoms of this condition include:  Pain when bending or straightening your finger.  Tenderness or swelling where your finger attaches to the palm of  your hand.  A lump in the palm of your hand or on the inside of your finger.  Hearing a popping sound when you try to straighten your finger.  Feeling a popping, catching, or locking sensation when you try to straighten your finger.  Being unable to straighten your finger. How is this diagnosed? This condition is diagnosed based on your symptoms and a physical exam. How is this treated? This condition may be treated  by:  Resting your finger and avoiding activities that make symptoms worse.  Wearing a finger splint to keep your finger in a slightly bent position.  Taking NSAIDs to relieve pain and swelling.  Injecting medicine (steroids) into the tendon sheath to reduce swelling and irritation. Injections may need to be repeated.  Having surgery to open the tendon sheath. This may be done if other treatments do not work and you cannot straighten your finger. You may need physical therapy after surgery. Follow these instructions at home:  Use moist heat to help reduce pain and swelling as told by your health care provider.  Rest your finger and avoid activities that make pain worse. Return to normal activities as told by your health care provider.  If you have a splint, wear it as told by your health care provider.  Take over-the-counter and prescription medicines only as told by your health care provider.  Keep all follow-up visits as told by your health care provider. This is important. Contact a health care provider if:  Your symptoms are not improving with home care. Summary  Trigger finger (stenosing tenosynovitis) causes your finger to get stuck in a bent position, and it can make it difficult and painful to straighten your finger.  This condition develops when a finger tendon or tendon sheath thickens.  Treatment starts with resting, wearing a splint, and taking NSAIDs.  In severe cases, surgery to open the tendon sheath may be needed. This information is not intended to replace advice given to you by your health care provider. Make sure you discuss any questions you have with your health care provider. Document Released: 06/13/2004 Document Revised: 08/04/2016 Document Reviewed: 08/04/2016 Elsevier Interactive Patient Education  2017 [place] Tunnel Release Carpal tunnel release is a surgical procedure to relieve numbness and pain in your hand that are caused by carpal  tunnel syndrome. Your carpal tunnel is a narrow, hollow space in your wrist. It passes between your wrist bones and a band of connective tissue (transverse carpal ligament). The nerve that supplies most of your hand (median nerve) passes through this space, and so do the connections between your fingers and the muscles of your arm (tendons). Carpal tunnel syndrome makes this space swell and become narrow, and this causes pain and numbness. In carpal tunnel release surgery, a surgeon cuts through the transverse carpal ligament to make more room in the carpal tunnel space. You may have this surgery if other types of treatment have not worked. Tell a health care provider about:  Any allergies you have.  All medicines you are taking, including vitamins, herbs, eye drops, creams, and over-the-counter medicines.  Any problems you or family members have had with anesthetic medicines.  Any blood disorders you have.  Any surgeries you have had.  Any medical conditions you have. What are the risks? Generally, this is a safe procedure. However, problems may occur, including:  Bleeding.  Infection.  Injury to the median nerve.  Need for additional surgery. What happens before the procedure?  Ask  your health care provider about:  Changing or stopping your regular medicines. This is especially important if you are taking diabetes medicines or blood thinners.  Taking medicines such as aspirin and ibuprofen. These medicines can thin your blood. Do not take these medicines before your procedure if your health care provider instructs you not to.  Do not eat or drink anything after midnight on the night before the procedure or as directed by your health care provider.  Plan to have someone take you home after the procedure. What happens during the procedure?  An IV tube may be inserted into a vein.  You will be given one of the following:  A medicine that numbs the wrist area (local  anesthetic). You may also be given a medicine to make you relax (sedative).  A medicine that makes you go to sleep (general anesthetic).  Your arm, hand, and wrist will be cleaned with a germ-killing solution (antiseptic).  Your surgeon will make a surgical cut (incision) over the palm side of your wrist. The surgeon will pull aside the skin of your wrist to expose the carpal tunnel space.  The surgeon will cut the transverse carpal ligament.  The edges of the incision will be closed with stitches (sutures) or staples.  A bandage (dressing) will be placed over your wrist and wrapped around your hand and wrist. What happens after the procedure?  You may spend some time in a recovery area.  Your blood pressure, heart rate, breathing rate, and blood oxygen level will be monitored often until the medicines you were given have worn off.  You will likely have some pain. You will be given pain medicine.  You may need to wear a splint or a wrist brace over your dressing. This information is not intended to replace advice given to you by your health care provider. Make sure you discuss any questions you have with your health care provider. Document Released: 11/14/2003 Document Revised: 01/30/2016 Document Reviewed: 04/11/2014 Elsevier Interactive Patient Education  2017 [employer].

## 2016-11-30 ENCOUNTER — Ambulatory Visit (HOSPITAL_COMMUNITY): Payer: Medicare Other | Admitting: Physical Therapy

## 2016-11-30 DIAGNOSIS — M25551 Pain in right hip: Secondary | ICD-10-CM | POA: Diagnosis not present

## 2016-11-30 DIAGNOSIS — R2681 Unsteadiness on feet: Secondary | ICD-10-CM

## 2016-11-30 DIAGNOSIS — M25651 Stiffness of right hip, not elsewhere classified: Secondary | ICD-10-CM

## 2016-11-30 DIAGNOSIS — R262 Difficulty in walking, not elsewhere classified: Secondary | ICD-10-CM

## 2016-11-30 NOTE — Therapy (Signed)
[place], [place], [address] Phone: [phone]   Fax:  [phone]  Physical Therapy Treatment  Patient Details  Name: Penny Martinez MRN: [id number] Date of Birth: 10-16-1950 Referring Provider: Sanjuana Kava  Encounter Date: 11/30/2016      PT End of Session - 11/30/16 1340    Visit Number 7   Number of Visits 12   Date for PT Re-Evaluation 11/23/16   Authorization Type Medicare (traditional)    Authorization Time Period 11/02/16-12/14/16   Authorization - Visit Number 7   Authorization - Number of Visits 10   PT Start Time 6546   PT Stop Time 1115   PT Time Calculation (min) 45 min   Activity Tolerance Patient tolerated treatment well;No increased pain  Reports of pain reduced and no radicular symptoms at EOS   Behavior During Therapy [facility] for tasks assessed/performed      Past Medical History:  Diagnosis Date  . Acid reflux   . C. difficile colitis June/July 2015  . Cancer ([place])   . CHF (congestive heart failure) ([place])   . 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 Negative viral markers 2015  . Complication of anesthesia   . COPD (chronic obstructive pulmonary disease) ([place])   . Diabetes mellitus without complication ([place])   . GERD (gastroesophageal reflux disease)   . Hypercholesteremia   . Hypertension   . IBS (irritable bowel syndrome)   . Kidney disease   . Liver disease   . Thyroid disease     Past Surgical History:  Procedure Laterality Date  . ABDOMINAL HYSTERECTOMY    . APPENDECTOMY    . 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 CARPAL TUNNEL RELEASE    . CATARACT EXTRACTION    . CHOLECYSTECTOMY    . COLONOSCOPY N/A 02/19/2014   Dr. Gala Romney: rectal varices, rectal polyp overlying a varix non-manipulated  . ESOPHAGOGASTRODUODENOSCOPY N/A 02/19/2014   Dr. Gala Romney: esophageal varices, multiple gastric polyps with largest biopsied and hyperplastic. Negative H.pylori  . GIVENS  CAPSULE STUDY N/A 05/04/2014   multiple polypoid-like lesions throughout small bowel, scattered superficial non-bleeding erosions more distal bowel, referred to [facility] for enteroscopy. Capsule study not complete, poor images.   . urosepsis      There were no vitals filed for this visit.      Subjective Assessment - 11/30/16 1041    Subjective Pt states she is still having pain in her Rt hip and up into her back rated at 5/10.     Currently in Pain? Yes   Pain Score 5    Pain Location Back   Pain Orientation Right   Pain Descriptors / Indicators Burning;Aching                         [facility] Adult PT Treatment/Exercise - 11/30/16 0001      Lumbar Exercises: Stretches   Single Knee to Chest Stretch 5 reps;10 seconds   Single Knee to Chest Stretch Limitations use of towel   Piriformis Stretch 5 reps;10 seconds   Piriformis Stretch Limitations Rt only supine with Leg crossed and assist from towel     Lumbar Exercises: Seated   LAQ on Chair Limitations bil rowing with RTB while seated, 3x10, cues to decrease UT elevation and stabilizing core   Sit to Stand 10 reps   Sit to Stand Limitations no UE's     Lumbar  Exercises: Supine   Bent Knee Raise Limitations 3x10 bilat    Bridge 10 reps   Other Supine Lumbar Exercises deadbugs 1x10 with diaphragmatic breathing                  PT Short Term Goals - 11/02/16 2052      PT SHORT TERM GOAL #1   Title After 3 weeks pt will reports increased tolerance to AMB and prolonged standing by 100%.    Status New     PT SHORT TERM GOAL #2   Title After 3 weeks patient will demonstrate improved strength R ankle and knee AEB improved MMT by half a grade.    Status New           PT Long Term Goals - 11/02/16 2053      PT LONG TERM GOAL #1   Title After 6 weeks patient will demonstrate return to PLOF in IADL and community access AEB tolerance of 6MWT covering 1350 feet.    Status New     PT LONG TERM GOAL #2    Title After 6 weeks patient will demonstrate improved funcitonal strength AEB 5xSTS in <16 seconds hands free and pain free.    Status New     PT LONG TERM GOAL #3   Title After 6 weeks patient will demonstrate improved functional hip strength and stability AEB SLS >10sec bilat.    Status New               Plan - 11/30/16 1340    Clinical Impression Statement Pt with continued pain in Rt hip and lower back.  Admits to not doing much walking due to inablity to walk outside.  STates she has been doing some of her exercises.  PT able to complete all exercises this session without complaints or increase in pain.     Rehab Potential Fair   Clinical Impairments Affecting Rehab Potential Morbid obesity, history of spondylosis   PT Frequency 2x / week   PT Duration 6 weeks   PT Treatment/Interventions Balance training;Dry needling;Manual techniques;Passive range of motion;Therapeutic exercise;Therapeutic activities;Gait training;Moist Heat;Energy conservation;Functional mobility training   PT Next Visit Plan Remain flexion based program per evaluating therapist.  Next session begin postural 3 in standing position with theraband.     PT Home Exercise Plan Asked to keep a journal on water intake. 3/6:  piriformis stretch (seated/supine), side lying clams, bridge (pt asked to DC any HEP actiivty that worsens radicular pain); Right chair rotation stretch.    Consulted and Agree with Plan of Care Patient      Patient will benefit from skilled therapeutic intervention in order to improve the following deficits and impairments:  Abnormal gait, Improper body mechanics, Increased fascial restricitons, Pain, Postural dysfunction, Decreased activity tolerance, Decreased balance, Decreased range of motion, Decreased strength, Difficulty walking, Obesity  Visit Diagnosis: Pain in right hip  Stiffness of right hip, not elsewhere classified  Difficulty in walking, not elsewhere  classified  Unsteadiness on feet     Problem List Patient Active Problem List   Diagnosis Date Noted  . Thrombocytopenia ([place]) 11/04/2016  . Iron deficiency anemia due to chronic blood loss 01/31/2016  . Other specified hypothyroidism 09/20/2015  . Essential hypertension, benign 07/12/2015  . Morbid obesity with BMI of 40.0-44.9, adult ([place]) 07/12/2015  . Hepatic cirrhosis ([place]) 04/02/2014  . Acute colitis 03/05/2014  . Abdominal pain, acute, left lower quadrant 03/05/2014  . Colitis 03/05/2014  . Splenomegaly, congestive, chronic  02/17/2014  . Sepsis ([place]) 02/15/2014  . Absolute anemia 02/15/2014  . Diabetes mellitus with stage 3 chronic kidney disease ([place]) 02/15/2014  . CHF (congestive heart failure) ([place]) 02/15/2014  . CKD (chronic kidney disease) 02/15/2014  . UTI (lower urinary tract infection) 02/15/2014    Teena Irani, PTA/CLT [id number]  11/30/2016, 1:44 PM  [place] [address] [place], [place], [address] Phone: [phone]   Fax:  [phone]  Name: DEITRA CRAINE MRN: [id number] Date of Birth: 1951/04/23

## 2016-12-01 ENCOUNTER — Encounter (HOSPITAL_COMMUNITY): Payer: Self-pay

## 2016-12-01 ENCOUNTER — Encounter (HOSPITAL_COMMUNITY)
Admission: RE | Admit: 2016-12-01 | Discharge: 2016-12-01 | Disposition: A | Discharge status: Home / Self Care | Payer: Medicare Other | Source: Ambulatory Visit | Attending: Orthopedic Surgery | Admitting: Orthopedic Surgery

## 2016-12-01 DIAGNOSIS — E119 Type 2 diabetes mellitus without complications: Secondary | ICD-10-CM | POA: Diagnosis not present

## 2016-12-01 DIAGNOSIS — K589 Irritable bowel syndrome without diarrhea: Secondary | ICD-10-CM | POA: Diagnosis not present

## 2016-12-01 DIAGNOSIS — J449 Chronic obstructive pulmonary disease, unspecified: Secondary | ICD-10-CM | POA: Diagnosis not present

## 2016-12-01 DIAGNOSIS — K7460 Unspecified cirrhosis of liver: Secondary | ICD-10-CM | POA: Diagnosis not present

## 2016-12-01 DIAGNOSIS — E7800 Pure hypercholesterolemia, unspecified: Secondary | ICD-10-CM | POA: Diagnosis not present

## 2016-12-01 DIAGNOSIS — Z882 Allergy status to sulfonamides status: Secondary | ICD-10-CM | POA: Diagnosis not present

## 2016-12-01 DIAGNOSIS — Z6841 Body Mass Index (BMI) 40.0 and over, adult: Secondary | ICD-10-CM | POA: Diagnosis not present

## 2016-12-01 DIAGNOSIS — N289 Disorder of kidney and ureter, unspecified: Secondary | ICD-10-CM | POA: Diagnosis not present

## 2016-12-01 DIAGNOSIS — I509 Heart failure, unspecified: Secondary | ICD-10-CM | POA: Diagnosis not present

## 2016-12-01 DIAGNOSIS — K219 Gastro-esophageal reflux disease without esophagitis: Secondary | ICD-10-CM | POA: Diagnosis not present

## 2016-12-01 DIAGNOSIS — M65842 Other synovitis and tenosynovitis, left hand: Secondary | ICD-10-CM | POA: Diagnosis not present

## 2016-12-01 DIAGNOSIS — Z7982 Long term (current) use of aspirin: Secondary | ICD-10-CM | POA: Diagnosis not present

## 2016-12-01 DIAGNOSIS — G5602 Carpal tunnel syndrome, left upper limb: Secondary | ICD-10-CM | POA: Diagnosis present

## 2016-12-01 DIAGNOSIS — I110 Hypertensive heart disease with heart failure: Secondary | ICD-10-CM | POA: Diagnosis not present

## 2016-12-01 DIAGNOSIS — G4730 Sleep apnea, unspecified: Secondary | ICD-10-CM | POA: Diagnosis not present

## 2016-12-01 DIAGNOSIS — Z87891 Personal history of nicotine dependence: Secondary | ICD-10-CM | POA: Diagnosis not present

## 2016-12-01 DIAGNOSIS — M65332 Trigger finger, left middle finger: Secondary | ICD-10-CM | POA: Diagnosis not present

## 2016-12-01 DIAGNOSIS — Z880 Allergy status to penicillin: Secondary | ICD-10-CM | POA: Diagnosis not present

## 2016-12-01 DIAGNOSIS — E039 Hypothyroidism, unspecified: Secondary | ICD-10-CM | POA: Diagnosis not present

## 2016-12-01 HISTORY — DX: Anemia, unspecified: D64.9

## 2016-12-01 HISTORY — DX: Other specified postprocedural states: R11.2

## 2016-12-01 HISTORY — DX: Other specified postprocedural states: Z98.890

## 2016-12-01 HISTORY — DX: Personal history of urinary calculi: Z87.442

## 2016-12-01 HISTORY — DX: Polyneuropathy, unspecified: G62.9

## 2016-12-01 HISTORY — DX: Hypothyroidism, unspecified: E03.9

## 2016-12-01 HISTORY — DX: Sleep apnea, unspecified: G47.30

## 2016-12-01 HISTORY — DX: Unspecified osteoarthritis, unspecified site: M19.90

## 2016-12-01 LAB — BASIC METABOLIC PANEL
ANION GAP: 8 (ref 5–15)
BUN: 25 mg/dL — ABNORMAL HIGH (ref 6–20)
CHLORIDE: 99 mmol/L — AB (ref 101–111)
CO2: 29 mmol/L (ref 22–32)
Calcium: 8.7 mg/dL — ABNORMAL LOW (ref 8.9–10.3)
Creatinine, Ser: 1.03 mg/dL — ABNORMAL HIGH (ref 0.44–1.00)
GFR calc Af Amer: >60 mL/min (ref >60)
GFR calc non Af Amer: 56 mL/min — ABNORMAL LOW (ref >60)
GLUCOSE: 255 mg/dL — AB (ref 65–99)
Potassium: 4.1 mmol/L (ref 3.5–5.1)
Sodium: 136 mmol/L (ref 135–145)

## 2016-12-01 LAB — CBC WITH DIFFERENTIAL/PLATELET
BASOS ABS: 0 10*3/uL (ref 0.0–0.1)
Basophils Relative: 0 %
Eosinophils Absolute: 0.1 10*3/uL (ref 0.0–0.7)
Eosinophils Relative: 2 %
HEMATOCRIT: 35.7 % — AB (ref 36.0–46.0)
HEMOGLOBIN: 11.7 g/dL — AB (ref 12.0–15.0)
LYMPHS PCT: 26 %
Lymphs Abs: 1.1 10*3/uL (ref 0.7–4.0)
MCH: 31.2 pg (ref 26.0–34.0)
MCHC: 32.8 g/dL (ref 30.0–36.0)
MCV: 95.2 fL (ref 78.0–100.0)
MONO ABS: 0.4 10*3/uL (ref 0.1–1.0)
MONOS PCT: 9 %
NEUTROS ABS: 2.7 10*3/uL (ref 1.7–7.7)
NEUTROS PCT: 63 %
Platelets: 80 10*3/uL — ABNORMAL LOW (ref 150–400)
RBC: 3.75 MIL/uL — ABNORMAL LOW (ref 3.87–5.11)
RDW: 14.4 % (ref 11.5–15.5)
WBC: 4.2 10*3/uL (ref 4.0–10.5)

## 2016-12-02 NOTE — H&P (Signed)
Chief Complaint  Patient presents with  . Hand Problem      Pain paresthesias left hand for 20 years, triggering of the left long finger       HPI Penny Martinez is a 66 y.o. female.  Presents for possible surgery in her left hand. She is a carpal tunnel symptoms for 20 years. Showed a right carpal tunnel release. That was worse at the time she never had a left one done. She has pain paresthesias night pain which was severe associated with fine motor tasks deficits and triggering of the left long finger which did not respond to nonoperative care   Review of Systems Review of Systems ROS   Review of Systems  HENT: Negative for congestion.   Respiratory: Positive for shortness of breath. Negative for cough.   Cardiovascular: Negative for chest pain and leg swelling.  Endocrine: Positive for cold intolerance.  Musculoskeletal: Positive for arthralgias and joint swelling.  Allergic/Immunologic: Positive for environmental allergies.          Past Medical History:  Diagnosis Date  . Acid reflux    . C. difficile colitis June/July 2015  . Cancer ([place])    . CHF (congestive heart failure) ([place])    . Cirrhosis ([place])      likely due to NASH. Negative viral markers 2015  . Complication of anesthesia    . COPD (chronic obstructive pulmonary disease) ([place])    . Diabetes mellitus without complication ([place])    . GERD (gastroesophageal reflux disease)    . Hypercholesteremia    . Hypertension    . IBS (irritable bowel syndrome)    . Kidney disease    . Liver disease    . Thyroid disease             Past Surgical History:  Procedure Laterality Date  . ABDOMINAL HYSTERECTOMY      . APPENDECTOMY      . CAROTID ARTERY - SUBCLAVIAN ARTERY BYPASS GRAFT      . CARPAL TUNNEL RELEASE      . CATARACT EXTRACTION      . CHOLECYSTECTOMY      . COLONOSCOPY N/A 02/19/2014    Dr. Gala Romney: rectal varices, rectal polyp overlying a varix non-manipulated  . ESOPHAGOGASTRODUODENOSCOPY N/A 02/19/2014     Dr. Gala Romney: esophageal varices, multiple gastric polyps with largest biopsied and hyperplastic. Negative H.pylori  . GIVENS CAPSULE STUDY N/A 05/04/2014    multiple polypoid-like lesions throughout small bowel, scattered superficial non-bleeding erosions more distal bowel, referred to [facility] for [facility]oscopy. Capsule study not complete, poor images.   . urosepsis          Social History       Social History  Substance Use Topics  . Smoking status: Former Smoker      Packs/day: 2.00      Years: 35.00      Quit date: 05/05/2003  . Smokeless tobacco: Former Systems developer        Comment: Quit smoking 11 years ago  . Alcohol use No           Allergies  Allergen Reactions  . Niacin Rash and Shortness Of Breath  . Penicillins Shortness Of Breath  . Erythromycin Hives  . Statins        Other reaction(s): Muscle Pain  . Sulfa Antibiotics        Active Medications      Current Meds  Medication Sig  . amitriptyline (ELAVIL) 25 MG tablet Take 25  mg by mouth at bedtime.  Marland Kitchen aspirin 81 MG tablet Take 81 mg by mouth daily.  . baclofen (LIORESAL) 10 MG tablet Take 10 mg by mouth 3 (three) times daily.  . benazepril (LOTENSIN) 20 MG tablet Take 20 mg by mouth daily.  Marland Kitchen CRANBERRY-VITAMIN C PO Take 2 capsules by mouth at bedtime. Dosage is the 25,200 mg cranberry/vitamin C supplement  . diphenhydrAMINE (SOMINEX) 25 MG tablet Take by mouth at bedtime. Reported on 01/31/2016  . diphenoxylate-atropine (LOMOTIL) 2.5-0.025 MG tablet 1-2 tablets every 4-6 hours as needed for diarrhea.  . ezetimibe (ZETIA) 10 MG tablet Take 10 mg by mouth daily.  . furosemide (LASIX) 80 MG tablet Take 80 mg by mouth daily.  Marland Kitchen gabapentin (NEURONTIN) 100 MG capsule TAKE (1) CAPSULE BY MOUTH THREE TIMES DAILY  . HUMULIN R U-500 KWIKPEN 500 UNIT/ML kwikpen INJECT 130 UNITS S.Q. THREE TIMES DAILY WITH MEALS.  Marland Kitchen lansoprazole (PREVACID) 30 MG capsule Take 30 mg by mouth daily.  Marland Kitchen levothyroxine (SYNTHROID, LEVOTHROID) 100 MCG  tablet Take 1 tablet (100 mcg total) by mouth daily.  . Multiple Vitamins-Minerals (CENTRUM SILVER PO) Take 1 tablet by mouth daily.   . nitrofurantoin, macrocrystal-monohydrate, (MACROBID) 100 MG capsule Take 100 mg by mouth at bedtime.  . potassium chloride (K-DUR) 10 MEQ tablet Take 10 mEq by mouth daily.  . predniSONE (STERAPRED UNI-PAK 21 TAB) 5 MG (21) TBPK tablet Take 6 pills first day; 5 pills second day; 4 pills third day; 3 pills fourth day; 2 pills next day and 1 pill last day.  . propranolol (INDERAL) 10 MG tablet Take 2 tablets (20 mg total) by mouth daily.  Marland Kitchen ULTICARE SHORT PEN NEEDLES 31G X 8 MM MISC USE AS DIRECTED THREE TIMES DAILY.  Marland Kitchen VICTOZA 18 MG/3ML SOPN INJECT 1.8 MG S.Q. ONCE DAILY.            Physical Exam Physical Exam 1.BP 111/60   Pulse 82   Ht 5' 2"  (1.575 m)   Wt 224 lb (101.6 kg)   BMI 40.97 kg/m    2. Gen. appearance. The patient is well-developed and well-nourished, grooming and hygiene are normal. There are no gross congenital abnormalities   3. The patient is alert and oriented to person place and time   4. Mood and affect are normal   5. Ambulation NORMAL  Examination reveals the following: 6. On inspection we find tenderness over the A1 pulley of the left long finger no catching or locking on today's exam neurovascular status of the digit is normal. She has tenderness over the carpal tunnel positive, pression test at the carpal tunnel with normal wrist stability. Her right left grip strength shows a slight deficit on the left skin is normal. The sensory losses in the median nerve distribution color and perfusion are normal MEDICAL DECISION MAKING:    Data Reviewed Dr. Brooke Bonito notes of been reviewed   Assessment Carpal tunnel syndrome left   Left long trigger finger       Plan Carpal tunnel release with left long trigger finger release   Arther Abbott

## 2016-12-03 ENCOUNTER — Encounter (HOSPITAL_COMMUNITY)
Admission: RE | Disposition: A | Discharge status: Home / Self Care | Payer: Self-pay | Source: Ambulatory Visit | Attending: Orthopedic Surgery

## 2016-12-03 ENCOUNTER — Ambulatory Visit (HOSPITAL_COMMUNITY): Payer: Medicare Other | Admitting: Anesthesiology

## 2016-12-03 ENCOUNTER — Encounter (HOSPITAL_COMMUNITY): Payer: Self-pay

## 2016-12-03 ENCOUNTER — Ambulatory Visit (HOSPITAL_COMMUNITY)
Admission: RE | Admit: 2016-12-03 | Discharge: 2016-12-03 | Disposition: A | Discharge status: Home / Self Care | Payer: Medicare Other | Source: Ambulatory Visit | Attending: Orthopedic Surgery | Admitting: Orthopedic Surgery

## 2016-12-03 DIAGNOSIS — K7460 Unspecified cirrhosis of liver: Secondary | ICD-10-CM | POA: Insufficient documentation

## 2016-12-03 DIAGNOSIS — Z880 Allergy status to penicillin: Secondary | ICD-10-CM | POA: Insufficient documentation

## 2016-12-03 DIAGNOSIS — K219 Gastro-esophageal reflux disease without esophagitis: Secondary | ICD-10-CM | POA: Insufficient documentation

## 2016-12-03 DIAGNOSIS — E119 Type 2 diabetes mellitus without complications: Secondary | ICD-10-CM | POA: Insufficient documentation

## 2016-12-03 DIAGNOSIS — M65842 Other synovitis and tenosynovitis, left hand: Secondary | ICD-10-CM | POA: Insufficient documentation

## 2016-12-03 DIAGNOSIS — M65332 Trigger finger, left middle finger: Secondary | ICD-10-CM | POA: Insufficient documentation

## 2016-12-03 DIAGNOSIS — I110 Hypertensive heart disease with heart failure: Secondary | ICD-10-CM | POA: Diagnosis not present

## 2016-12-03 DIAGNOSIS — J449 Chronic obstructive pulmonary disease, unspecified: Secondary | ICD-10-CM | POA: Insufficient documentation

## 2016-12-03 DIAGNOSIS — I509 Heart failure, unspecified: Secondary | ICD-10-CM | POA: Diagnosis not present

## 2016-12-03 DIAGNOSIS — G5602 Carpal tunnel syndrome, left upper limb: Secondary | ICD-10-CM | POA: Insufficient documentation

## 2016-12-03 DIAGNOSIS — Z7982 Long term (current) use of aspirin: Secondary | ICD-10-CM | POA: Insufficient documentation

## 2016-12-03 DIAGNOSIS — Z87891 Personal history of nicotine dependence: Secondary | ICD-10-CM | POA: Insufficient documentation

## 2016-12-03 DIAGNOSIS — Z882 Allergy status to sulfonamides status: Secondary | ICD-10-CM | POA: Insufficient documentation

## 2016-12-03 DIAGNOSIS — E039 Hypothyroidism, unspecified: Secondary | ICD-10-CM | POA: Insufficient documentation

## 2016-12-03 DIAGNOSIS — E7800 Pure hypercholesterolemia, unspecified: Secondary | ICD-10-CM | POA: Insufficient documentation

## 2016-12-03 DIAGNOSIS — G4730 Sleep apnea, unspecified: Secondary | ICD-10-CM | POA: Insufficient documentation

## 2016-12-03 DIAGNOSIS — K589 Irritable bowel syndrome without diarrhea: Secondary | ICD-10-CM | POA: Insufficient documentation

## 2016-12-03 DIAGNOSIS — Z6841 Body Mass Index (BMI) 40.0 and over, adult: Secondary | ICD-10-CM | POA: Insufficient documentation

## 2016-12-03 DIAGNOSIS — N289 Disorder of kidney and ureter, unspecified: Secondary | ICD-10-CM | POA: Insufficient documentation

## 2016-12-03 HISTORY — PX: CARPAL TUNNEL RELEASE: SHX101

## 2016-12-03 HISTORY — PX: TRIGGER FINGER RELEASE: SHX641

## 2016-12-03 LAB — GLUCOSE, CAPILLARY
GLUCOSE-CAPILLARY: 251 mg/dL — AB (ref 65–99)
Glucose-Capillary: 252 mg/dL — ABNORMAL HIGH (ref 65–99)

## 2016-12-03 SURGERY — CARPAL TUNNEL RELEASE
Anesthesia: Regional | Site: Hand | Laterality: Left

## 2016-12-03 MED ORDER — HYDROCODONE-ACETAMINOPHEN 5-325 MG PO TABS: 1.0000 | ORAL_TABLET | ORAL | 0 refills | Status: DC | PRN

## 2016-12-03 MED ORDER — 0.9 % SODIUM CHLORIDE (POUR BTL) OPTIME
TOPICAL | Status: DC | PRN
  Administered 2016-12-03: 500 mL

## 2016-12-03 MED ORDER — VANCOMYCIN HCL 10 G IV SOLR
1500.0000 mg | INTRAVENOUS | Status: AC
  Administered 2016-12-03: 1500 mg via INTRAVENOUS
  Filled 2016-12-03 (×2): qty 1500

## 2016-12-03 MED ORDER — FENTANYL CITRATE (PF) 100 MCG/2ML IJ SOLN
INTRAMUSCULAR | Status: AC
  Filled 2016-12-03: qty 2

## 2016-12-03 MED ORDER — MIDAZOLAM HCL 2 MG/2ML IJ SOLN
1.0000 mg | INTRAMUSCULAR | Status: AC
  Administered 2016-12-03: 2 mg via INTRAVENOUS

## 2016-12-03 MED ORDER — BUPIVACAINE HCL (PF) 0.5 % IJ SOLN
INTRAMUSCULAR | Status: AC
  Filled 2016-12-03: qty 30

## 2016-12-03 MED ORDER — BUPIVACAINE HCL (PF) 0.5 % IJ SOLN
INTRAMUSCULAR | Status: DC | PRN
  Administered 2016-12-03: 20 mL

## 2016-12-03 MED ORDER — MIDAZOLAM HCL 5 MG/5ML IJ SOLN
INTRAMUSCULAR | Status: DC | PRN
  Administered 2016-12-03 (×2): 1 mg via INTRAVENOUS

## 2016-12-03 MED ORDER — MIDAZOLAM HCL 2 MG/2ML IJ SOLN
INTRAMUSCULAR | Status: AC
  Filled 2016-12-03: qty 2

## 2016-12-03 MED ORDER — PROPOFOL 500 MG/50ML IV EMUL
INTRAVENOUS | Status: DC | PRN
  Administered 2016-12-03: 50 ug/kg/min via INTRAVENOUS

## 2016-12-03 MED ORDER — FENTANYL CITRATE (PF) 100 MCG/2ML IJ SOLN
25.0000 ug | INTRAMUSCULAR | Status: AC
  Administered 2016-12-03: 25 ug via INTRAVENOUS

## 2016-12-03 MED ORDER — PROPOFOL 10 MG/ML IV BOLUS
INTRAVENOUS | Status: AC
  Filled 2016-12-03: qty 20

## 2016-12-03 MED ORDER — LIDOCAINE HCL (PF) 0.5 % IJ SOLN
INTRAMUSCULAR | Status: DC | PRN
  Administered 2016-12-03: 50 mL

## 2016-12-03 MED ORDER — CHLORHEXIDINE GLUCONATE 4 % EX LIQD: 60.0000 mL | Freq: Once | CUTANEOUS | Status: DC

## 2016-12-03 MED ORDER — FENTANYL CITRATE (PF) 100 MCG/2ML IJ SOLN: 25.0000 ug | INTRAMUSCULAR | Status: DC | PRN

## 2016-12-03 MED ORDER — LIDOCAINE HCL (PF) 0.5 % IJ SOLN
INTRAMUSCULAR | Status: AC
  Filled 2016-12-03: qty 50

## 2016-12-03 MED ORDER — LACTATED RINGERS IV SOLN
INTRAVENOUS | Status: DC
  Administered 2016-12-03: 08:00:00 via INTRAVENOUS

## 2016-12-03 MED ORDER — SODIUM CHLORIDE 0.9 % IV SOLN
INTRAVENOUS | Status: DC | PRN
  Administered 2016-12-03: 08:00:00 via INTRAVENOUS

## 2016-12-03 SURGICAL SUPPLY — 42 items
BAG HAMPER (MISCELLANEOUS) ×2 IMPLANT
BANDAGE ELASTIC 2 LF NS (GAUZE/BANDAGES/DRESSINGS) ×2 IMPLANT
BANDAGE ELASTIC 3 LF NS (GAUZE/BANDAGES/DRESSINGS) ×2 IMPLANT
BANDAGE ESMARK 4X12 BL STRL LF (DISPOSABLE) ×1 IMPLANT
BLADE SURG 15 STRL LF DISP TIS (BLADE) ×1 IMPLANT
BLADE SURG 15 STRL SS (BLADE) ×1
BNDG COHESIVE 4X5 TAN STRL (GAUZE/BANDAGES/DRESSINGS) ×2 IMPLANT
BNDG CONFORM 2 STRL LF (GAUZE/BANDAGES/DRESSINGS) ×2 IMPLANT
BNDG ESMARK 4X12 BLUE STRL LF (DISPOSABLE) ×2
BNDG GAUZE ELAST 4 BULKY (GAUZE/BANDAGES/DRESSINGS) ×2 IMPLANT
CHLORAPREP W/TINT 10.5 ML (MISCELLANEOUS) ×2 IMPLANT
CHLORAPREP W/TINT 26ML (MISCELLANEOUS) ×2 IMPLANT
CLOTH BEACON ORANGE TIMEOUT ST (SAFETY) ×2 IMPLANT
COVER LIGHT HANDLE STERIS (MISCELLANEOUS) ×4 IMPLANT
CUFF TOURNIQUET SINGLE 18IN (TOURNIQUET CUFF) ×2 IMPLANT
CUFF TOURNIQUET SINGLE 24IN (TOURNIQUET CUFF) IMPLANT
DECANTER SPIKE VIAL GLASS SM (MISCELLANEOUS) ×2 IMPLANT
DRAPE PROXIMA HALF (DRAPES) ×2 IMPLANT
DRSG XEROFORM 1X8 (GAUZE/BANDAGES/DRESSINGS) ×2 IMPLANT
ELECT NEEDLE TIP 2.8 STRL (NEEDLE) ×2 IMPLANT
ELECT REM PT RETURN 9FT ADLT (ELECTROSURGICAL) ×2
ELECTRODE REM PT RTRN 9FT ADLT (ELECTROSURGICAL) ×1 IMPLANT
GAUZE SPONGE 4X4 12PLY STRL (GAUZE/BANDAGES/DRESSINGS) ×2 IMPLANT
GLOVE BIOGEL PI IND STRL 7.0 (GLOVE) ×1 IMPLANT
GLOVE BIOGEL PI INDICATOR 7.0 (GLOVE) ×1
GLOVE SKINSENSE NS SZ8.0 LF (GLOVE) ×1
GLOVE SKINSENSE STRL SZ8.0 LF (GLOVE) ×1 IMPLANT
GLOVE SS N UNI LF 8.5 STRL (GLOVE) ×2 IMPLANT
GOWN STRL REUS W/ TWL LRG LVL3 (GOWN DISPOSABLE) ×1 IMPLANT
GOWN STRL REUS W/TWL LRG LVL3 (GOWN DISPOSABLE) ×5 IMPLANT
GOWN STRL REUS W/TWL XL LVL3 (GOWN DISPOSABLE) ×2 IMPLANT
HAND ALUMI XLG (SOFTGOODS) ×2 IMPLANT
KIT ROOM TURNOVER APOR (KITS) ×2 IMPLANT
MANIFOLD NEPTUNE II (INSTRUMENTS) ×2 IMPLANT
NEEDLE HYPO 21X1.5 SAFETY (NEEDLE) ×2 IMPLANT
NS IRRIG 1000ML POUR BTL (IV SOLUTION) ×2 IMPLANT
PACK BASIC LIMB (CUSTOM PROCEDURE TRAY) ×2 IMPLANT
PAD ARMBOARD 7.5X6 YLW CONV (MISCELLANEOUS) ×2 IMPLANT
SET BASIN LINEN APH (SET/KITS/TRAYS/PACK) ×2 IMPLANT
SPONGE GAUZE 2X2 8PLY STRL LF (GAUZE/BANDAGES/DRESSINGS) IMPLANT
SUT ETHILON 3 0 FSL (SUTURE) ×2 IMPLANT
SYR CONTROL 10ML LL (SYRINGE) ×2 IMPLANT

## 2016-12-03 NOTE — Interval H&P Note (Signed)
History and Physical Interval Note:  12/03/2016 8:04 AM  Penny Martinez  has presented today for surgery, with the diagnosis of LEFT CARPAL TUNNEL SYNDROME, LEFT LONG TRIGGER FINGER RELEASE  The various methods of treatment have been discussed with the patient and family. After consideration of risks, benefits and other options for treatment, the patient has consented to  Procedure(s): CARPAL TUNNEL RELEASE (Left) RELEASE TRIGGER FINGER/A-1 PULLEY LEFT LONG TRIGGER FINGER RELEASE (Left) as a surgical intervention .  The patient's history has been reviewed, patient examined, no change in status, stable for surgery.  I have reviewed the patient's chart and labs.  Questions were answered to the patient's satisfaction.     Arther Abbott

## 2016-12-03 NOTE — Brief Op Note (Signed)
12/03/2016  9:05 AM  PATIENT:  Penny Martinez  66 y.o. female  PRE-OPERATIVE DIAGNOSIS:  LEFT CARPAL TUNNEL SYNDROME, LEFT LONG finger stenosing tenosynovitis POST-OPERATIVE DIAGNOSIS:  LEFT CARPAL TUNNEL SYNDROME, LEFT LONG finger stenosing tenosynovitis  PROCEDURE:  Procedure(s): CARPAL TUNNEL RELEASE (Left) RELEASE TRIGGER FINGER/A-1 PULLEY LEFT LONG TRIGGER FINGER RELEASE (Left)   Findings intraoperatively stenosing tenosynovitis of the left long finger and compression of the median nerve in the carpal tunnel   SURGEON:  Surgeon(s) and Role:    * Carole Civil, MD - Primary  PHYSICIAN ASSISTANT:   ASSISTANTS: none   ANESTHESIA:   regional  EBL:  Total I/O In: -  Out: 2 [Blood:2]  BLOOD ADMINISTERED:none  DRAINS: none   LOCAL MEDICATIONS USED:  MARCAINE     SPECIMEN:  No Specimen  DISPOSITION OF SPECIMEN:  N/A  COUNTS:  YES  TOURNIQUET:   Total Tourniquet Time Documented: Upper Arm (Left) - 33 minutes Total: Upper Arm (Left) - 33 minutes   DICTATION: .Viviann Spare Dictation  PLAN OF CARE: Discharge to home after PACU  PATIENT DISPOSITION:  PACU - hemodynamically stable.   Delay start of Pharmacological VTE agent (>24hrs) due to surgical blood loss or risk of bleeding: not applicable  [id number]

## 2016-12-03 NOTE — Op Note (Signed)
12/03/2016 9:06 AM  Operative report  Carpal tunnel release left wrist and trigger finger release left long finger  Diagnosis carpal tunnel syndrome left wrist and stenosing Tenosynovitis of the left long finger  Procedure open carpal tunnel release left wrist, release of A1 pulley left long finger  Surgeon Aline Brochure  Anesthesia regional Bier block  Surgical findings stenosing tenosynovitis of the left long finger and compression of the median nerve in the carpal tunnel  Indications for procedure numbness tingling weakness left hand refractory to nonoperative treatment including splinting and activity modification and persistent catching locking and pain over the A1 pulley of the left long finger  Surgery was done as follows  The patient was identified in the preop area the surgical sites were confirmed chart review was completed surgical site was marked patient was taken to surgery  The patient had a Bier block  Timeout was completed  The carpal tunnel was addressed first  A mid line incision was made in line with the ring finger on his radial border subcutaneous tissue was divided bluntly down to the palmar fascia the distal extent of the carpal tunnel was identified a blunt object was placed beneath it the nerve was swept away with a sweeping motion and then sharp release of the transverse carpal ligament was performed. Inspection of the carpal tunnel revealed no lesions in the carpal tunnel  We then made a transverse incision just distal to the transverse carpal trigger release we divided the subcutaneous taste tissue bluntly we protected the neurovascular bundles on each side of the A1 pulley we passed a blunt instrument beneath the A1 pulley and then released the A1 pulley with sharp dissection. After flexing and extending the finger several times and was no evidence of impingement or catching  Both wounds were irrigated thoroughly and closed with 3-0 nylon suture. We used  running suture on the carpal tunnel incision and interrupted nylon suture on the trigger finger release  10 mL of plain Marcaine half percent injected at each surgical site and sterile dressing applied  [id number] [phone]

## 2016-12-03 NOTE — Anesthesia Preprocedure Evaluation (Signed)
Anesthesia Evaluation  Patient identified by MRN, date of birth, ID band Patient awake    Reviewed: Allergy & Precautions, NPO status , Patient's Chart, lab work & pertinent test results  History of Anesthesia Complications (+) PONV and history of anesthetic complications  Airway Mallampati: II  TM Distance: >3 FB     Dental  (+) Teeth Intact   Pulmonary sleep apnea and Continuous Positive Airway Pressure Ventilation , COPD, former smoker,    breath sounds clear to auscultation       Cardiovascular hypertension, Pt. on medications +CHF   Rhythm:Regular Rate:Normal     Neuro/Psych    GI/Hepatic GERD  ,(+) Cirrhosis  ([place])      ,   Endo/Other  diabetes, Type 2, Oral Hypoglycemic AgentsHypothyroidism Morbid obesity  Renal/GU Renal disease     Musculoskeletal   Abdominal   Peds  Hematology   Anesthesia Other Findings   Reproductive/Obstetrics                             Anesthesia Physical Anesthesia Plan  ASA: III  Anesthesia Plan: Bier Block   Post-op Pain Management:    Induction: Intravenous  Airway Management Planned: Simple Face Mask  Additional Equipment:   Intra-op Plan:   Post-operative Plan:   Informed Consent: I have reviewed the patients History and Physical, chart, labs and discussed the procedure including the risks, benefits and alternatives for the proposed anesthesia with the patient or authorized representative who has indicated his/her understanding and acceptance.     Plan Discussed with:   Anesthesia Plan Comments:         Anesthesia Quick Evaluation

## 2016-12-03 NOTE — Anesthesia Procedure Notes (Signed)
Anesthesia Regional Block: Bier block (IV Regional)   Pre-Anesthetic Checklist: ,, timeout performed, Correct Patient, Correct Site, Correct Laterality, Correct Procedure,, site marked, surgical consent,, at surgeon's request  Laterality: Left     Needles:  Injection technique: Single-shot  Needle Type: Other      Needle Gauge: 22     Additional Needles:   Procedures:,,,,,,, Esmarch exsanguination, single tourniquet utilized,   Nerve Stimulator or Paresthesia:   Additional Responses:  Pulse checked post tourniquet inflation. IV NSL discontinued post injection. Narrative:  End time: 12/03/2016 8:32 AM  Performed by: Personally

## 2016-12-03 NOTE — Transfer of Care (Signed)
Immediate Anesthesia Transfer of Care Note  Patient: Penny Martinez  Procedure(s) Performed: Procedure(s): CARPAL TUNNEL RELEASE (Left) RELEASE TRIGGER FINGER/A-1 PULLEY LEFT LONG TRIGGER FINGER RELEASE (Left)  Patient Location: PACU  Anesthesia Type:Bier block  Level of Consciousness: awake, alert , oriented and patient cooperative  Airway & Oxygen Therapy: Patient Spontanous Breathing and Patient connected to nasal cannula oxygen  Post-op Assessment: Report given to RN and Post -op Vital signs reviewed and stable  Post vital signs: Reviewed and stable  Last Vitals:  Vitals:   12/03/16 0805 12/03/16 0810  BP: 123/65   Pulse:    Resp: 16 11  Temp:      Last Pain:  Vitals:   12/03/16 0726  TempSrc: Oral  PainSc: 5       Patients Stated Pain Goal: 5 (98/06/99 9672)  Complications: No apparent anesthesia complications

## 2016-12-03 NOTE — Anesthesia Procedure Notes (Signed)
Procedure Name: MAC Date/Time: 12/03/2016 8:32 AM Performed by: Andree Elk, AMY A Pre-anesthesia Checklist: Patient identified, Timeout performed, Emergency Drugs available, Suction available and Patient being monitored Oxygen Delivery Method: Simple face mask

## 2016-12-03 NOTE — Anesthesia Postprocedure Evaluation (Signed)
Anesthesia Post Note  Patient: Penny Martinez  Procedure(s) Performed: Procedure(s) (LRB): CARPAL TUNNEL RELEASE (Left) RELEASE TRIGGER FINGER/A-1 PULLEY LEFT LONG TRIGGER FINGER RELEASE (Left)  Patient location during evaluation: PACU Anesthesia Type: Bier Block Level of consciousness: awake and alert and oriented Pain management: pain level controlled Vital Signs Assessment: post-procedure vital signs reviewed and stable Respiratory status: patient connected to nasal cannula oxygen and respiratory function stable Cardiovascular status: stable Postop Assessment: no signs of nausea or vomiting Anesthetic complications: no     Last Vitals:  Vitals:   12/03/16 0810 12/03/16 0911  BP:  (P) 113/60  Pulse:  (P) 77  Resp: 11 (P) 14  Temp:  (P) 36.8 C    Last Pain:  Vitals:   12/03/16 0726  TempSrc: Oral  PainSc: 5                  ADAMS, AMY A

## 2016-12-03 NOTE — Discharge Instructions (Signed)
Elevate the hand for 72 hours.  Apply ice packs as needed to control swelling.  Moves your fingers every hour opening and closing the hand to make a closed fist 10 times per hour while awake Keep dressing clean and dry doctor will change in the office

## 2016-12-05 ENCOUNTER — Encounter (HOSPITAL_COMMUNITY): Payer: Self-pay | Admitting: Orthopedic Surgery

## 2016-12-07 ENCOUNTER — Telehealth (HOSPITAL_COMMUNITY): Payer: Self-pay | Admitting: Physician Assistant

## 2016-12-07 ENCOUNTER — Ambulatory Visit (HOSPITAL_COMMUNITY): Payer: Medicare Other

## 2016-12-07 NOTE — Telephone Encounter (Signed)
12/07/16 patient left a message to cx today but no reason was given

## 2016-12-08 ENCOUNTER — Ambulatory Visit (INDEPENDENT_AMBULATORY_CARE_PROVIDER_SITE_OTHER): Payer: Self-pay | Admitting: Orthopedic Surgery

## 2016-12-08 ENCOUNTER — Encounter: Payer: Self-pay | Admitting: Orthopedic Surgery

## 2016-12-08 DIAGNOSIS — Z4889 Encounter for other specified surgical aftercare: Secondary | ICD-10-CM

## 2016-12-08 NOTE — Progress Notes (Signed)
Chief Complaint  Patient presents with  . Follow-up    POST OP LEFT CTR, LEFT LONG TFR, DOS 12/03/16    Patient had a left carpal tunnel release left trigger finger release of the long finger she is doing well  Her dressing was removed wound was checked and was normal she has some stiffness of the long finger but can move all the fingers reasonably well  She is encouraged for active range of motion come back on the 12th for suture removal

## 2016-12-09 ENCOUNTER — Other Ambulatory Visit: Payer: Self-pay | Admitting: "Endocrinology

## 2016-12-10 ENCOUNTER — Encounter (HOSPITAL_COMMUNITY): Payer: Self-pay

## 2016-12-10 ENCOUNTER — Telehealth: Payer: Self-pay | Admitting: Orthopedic Surgery

## 2016-12-10 ENCOUNTER — Ambulatory Visit (HOSPITAL_COMMUNITY): Payer: Medicare Other | Attending: Orthopaedic Surgery

## 2016-12-10 DIAGNOSIS — R262 Difficulty in walking, not elsewhere classified: Secondary | ICD-10-CM

## 2016-12-10 DIAGNOSIS — M25651 Stiffness of right hip, not elsewhere classified: Secondary | ICD-10-CM | POA: Diagnosis present

## 2016-12-10 DIAGNOSIS — M25551 Pain in right hip: Secondary | ICD-10-CM

## 2016-12-10 DIAGNOSIS — R2681 Unsteadiness on feet: Secondary | ICD-10-CM | POA: Diagnosis present

## 2016-12-10 NOTE — Therapy (Signed)
[place], [place], [address] Phone: [phone]   Fax:  [phone]  Physical Therapy Treatment/Reassessment  Patient Details  Name: Penny Martinez MRN: [id number] Date of Birth: 1950/09/14 Referring Provider: Sanjuana Kava  Encounter Date: 12/10/2016      PT End of Session - 12/10/16 1023    Visit Number 8   Number of Visits 12   Date for PT Re-Evaluation 01/07/17   Authorization Type Medicare (traditional)    Authorization Time Period 11/02/16-12/14/16   Authorization - Visit Number 8   Authorization - Number of Visits 10   PT Start Time 7106   PT Stop Time 1112   PT Time Calculation (min) 42 min   Activity Tolerance Patient tolerated treatment well;No increased pain  Reports of pain reduced and no radicular symptoms at EOS   Behavior During Therapy [facility] for tasks assessed/performed      Past Medical History:  Diagnosis Date  . Acid reflux   . Anemia   . Arthritis   . C. difficile colitis June/July 2015  . Cancer ([place])    skin cancer  . CHF (congestive heart failure) ([place])   . 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 Negative viral markers 2015  . Complication of anesthesia   . COPD (chronic obstructive pulmonary disease) ([place])   . Diabetes mellitus without complication ([place])   . GERD (gastroesophageal reflux disease)   . History of kidney stones   . Hypercholesteremia   . Hypertension   . Hypothyroidism   . IBS (irritable bowel syndrome)   . Kidney disease   . Liver disease   . Neuropathy ([place])   . PONV (postoperative nausea and vomiting)   . Sleep apnea   . Thyroid disease     Past Surgical History:  Procedure Laterality Date  . ABDOMINAL HYSTERECTOMY    . APPENDECTOMY    . CAROTID ARTERY - SUBCLAVIAN ARTERY BYPASS GRAFT Right   . CARPAL TUNNEL RELEASE Right   . CARPAL TUNNEL RELEASE Left 12/03/2016   Procedure: CARPAL TUNNEL RELEASE;  Surgeon: Carole Civil, MD;  Location: [facility];  Service: Orthopedics;  Laterality: Left;  . CATARACT EXTRACTION Bilateral   . CHOLECYSTECTOMY    . COLONOSCOPY N/A 02/19/2014   Dr. Gala Romney: rectal varices, rectal polyp overlying a varix non-manipulated  . ESOPHAGOGASTRODUODENOSCOPY N/A 02/19/2014   Dr. Gala Romney: esophageal varices, multiple gastric polyps with largest biopsied and hyperplastic. Negative H.pylori  . GIVENS CAPSULE STUDY N/A 05/04/2014   multiple polypoid-like lesions throughout small bowel, scattered superficial non-bleeding erosions more distal bowel, referred to [facility] for enteroscopy. Capsule study not complete, poor images.   Marland Kitchen TRIGGER FINGER RELEASE Left 12/03/2016   Procedure: RELEASE TRIGGER FINGER/A-1 PULLEY LEFT LONG TRIGGER FINGER RELEASE;  Surgeon: Carole Civil, MD;  Location: [facility] ORS;  Service: Orthopedics;  Laterality: Left;  . urosepsis      There were no vitals filed for this visit.      Subjective Assessment - 12/10/16 1031    Subjective Pt states that her back has been hurting right much since yesterday but overall it is better. She had carpal tunnel between now and last session which went well.   How long can you sit comfortably? 03/20: able to sit for about an hour having to change weight bearing ofter; (eval was About 1 hour. )   How long can you stand comfortably? 03/20:  Able to stand after a few minutes, is  able to tolerate standing long enough to shower then requires a sit.  (Eval Estimated about 10-12 minutes (long enough to shower)    How long can you walk comfortably? 03/20:  the same.  (on eval was About 30-45 minutes for grocery shopping with excruciating pain thereafter.)   Diagnostic tests Xrays for low back, hip/pelvis (pt doesn't recall results)    Patient Stated Goals Get back to normal like.   Currently in Pain? Yes   Pain Score 4    Pain Location Back   Pain Orientation Right   Pain Descriptors / Indicators Aching;Burning   Pain Type Chronic pain   Pain Onset 1 to 4 weeks ago    Pain Frequency Intermittent            [facility] PT Assessment - 12/10/16 0001      Strength   Right Ankle Dorsiflexion 4/5  decreased pain from initial eval     Ambulation/Gait   Ambulation Distance (Feet) 654 Feet  6MWT; 1 seated rest break due to BLE weakness (R>L)   Assistive device None   Gait Comments step through, bil decr hip ext, bil trendelenberg, wide BOS, antalgic on R, decr PF/pushoff bil     Balance   Balance Assessed Yes     Static Standing Balance   Static Standing - Comment/# of Minutes R: 3 sec L: 4 sec     Standardized Balance Assessment   Standardized Balance Assessment Five Times Sit to Stand   Five times sit to stand comments  17.5 sec from chair with no UE support            [facility] Adult PT Treatment/Exercise - 12/10/16 0001      Lumbar Exercises: Standing   Other Standing Lumbar Exercises standing marches 2x10 BLE     Lumbar Exercises: Supine   Other Supine Lumbar Exercises deadbugs 2x10 with diaphragmatic breathing and RTB around knees         Balance Exercises - 12/10/16 1102      Balance Exercises: Standing   SLS Eyes open;Solid surface;Upper extremity support 1;10 secs;5 reps  2 sets           PT Education - 12/10/16 1126    Education provided Yes   Education Details reassessment findings, POC, HEP   Person(s) Educated Patient   Methods Explanation;Demonstration   Comprehension Verbalized understanding;Returned demonstration          PT Short Term Goals - 12/10/16 1035      PT SHORT TERM GOAL #1   Title After 3 weeks pt will reports increased tolerance to AMB and prolonged standing by 100%.    Status On-going     PT SHORT TERM GOAL #2   Title After 3 weeks patient will demonstrate improved strength R ankle and knee AEB improved MMT by half a grade.    Baseline 4/5: 4/5, was 4-/5   Status Achieved           PT Long Term Goals - 12/10/16 1050      PT LONG TERM GOAL #1   Title After 6 weeks patient will  demonstrate return to PLOF in IADL and community access AEB tolerance of 6MWT covering 1350 feet.    Baseline 4/5: 687f with 1 seated rest break due to BLE weakness (R>L)   Status On-going     PT LONG TERM GOAL #2   Title After 6 weeks patient will demonstrate improved funcitonal strength AEB 5xSTS in <16 seconds hands free  and pain free.    Baseline 4/5: 17.5sec   Status On-going     PT LONG TERM GOAL #3   Title After 6 weeks patient will demonstrate improved functional hip strength and stability AEB SLS >10sec bilat.    Baseline 4/5: L = 4 sec with light UE assist, R = 3 sec with light UE assist   Status On-going               Plan - Jan 06, 2017 1127    Clinical Impression Statement PT reassessed pt's outcome measures and goals this date. Pt has made improvements towards goals, though she has only met 1 STG while all others are on-going. Pt's tolerance to standing has improved since beginning therapy as she reports being able to do more with fewer rest breaks compared to when she first started coming here. She feels like she is 50% improved since starting, stating that the remaining 50% is due to continuing to have pain in her back/RLE and continued difficulty with ADL/IADLs. She continues to have a significant flexion preference as her pain peripheralized to her hip/buttock (5/10 pain) during 6MWT and following seated rest break, her pain centralized and she had 0/10 pain in her hip. Educated pt on this and that the goal of therapy is to gradually progress her tolerance for extension-based activites (i.e. walking, standing, etc.) and she verbalized understanding of this. She tolerated therex well this date with no increases in back pain, just c/o BLE fatigue (R>L). Pt would benefit from continued skilled PT interventions to maximize core strength and stability as well as BLE strength, endurance, and stability in order to decrease LBP and promote return to PLOF.   Rehab Potential Fair    Clinical Impairments Affecting Rehab Potential Morbid obesity, history of spondylosis   PT Frequency 2x / week   PT Duration 4 weeks   PT Treatment/Interventions Balance training;Dry needling;Manual techniques;Passive range of motion;Therapeutic exercise;Therapeutic activities;Gait training;Moist Heat;Energy conservation;Functional mobility training   PT Next Visit Plan Continue with flexion based program per evaluating therapist.  Next session begin postural 3 in standing position with theraband. Continue to promote gradual tolerance to extension-based activities (standing exercises, walking, etc.); continue with balance activities, monitoring LBP and if symptoms peripheralize    PT Home Exercise Plan Asked to keep a journal on water intake. 3/6:  piriformis stretch (seated/supine), side lying clams, bridge (pt asked to DC any HEP actiivty that worsens radicular pain); Right chair rotation stretch. 4/5: deadbugs with RTB around BLE   Consulted and Agree with Plan of Care Patient      Patient will benefit from skilled therapeutic intervention in order to improve the following deficits and impairments:  Abnormal gait, Improper body mechanics, Increased fascial restricitons, Pain, Postural dysfunction, Decreased activity tolerance, Decreased balance, Decreased range of motion, Decreased strength, Difficulty walking, Obesity  Visit Diagnosis: Pain in right hip - Plan: PT plan of care cert/re-cert  Stiffness of right hip, not elsewhere classified - Plan: PT plan of care cert/re-cert  Difficulty in walking, not elsewhere classified - Plan: PT plan of care cert/re-cert  Unsteadiness on feet - Plan: PT plan of care cert/re-cert       G-Codes - Jan 06, 2017 1133    Functional Assessment Tool Used (Outpatient Only) Clinical Judgment    Functional Limitation Mobility: Walking and moving around   Mobility: Walking and Moving Around Current Status ([id number]) At least 40 percent but less than 60 percent  impaired, limited or restricted   Mobility: Walking and  Moving Around Goal Status [phone]) At least 20 percent but less than 40 percent impaired, limited or restricted      Problem List Patient Active Problem List   Diagnosis Date Noted  . Carpal tunnel syndrome, left   . Trigger middle finger of left hand   . Thrombocytopenia ([place]) 11/04/2016  . Iron deficiency anemia due to chronic blood loss 01/31/2016  . Other specified hypothyroidism 09/20/2015  . Essential hypertension, benign 07/12/2015  . Morbid obesity with BMI of 40.0-44.9, adult ([place]) 07/12/2015  . Hepatic cirrhosis ([place]) 04/02/2014  . Acute colitis 03/05/2014  . Abdominal pain, acute, left lower quadrant 03/05/2014  . Colitis 03/05/2014  . Splenomegaly, congestive, chronic 02/17/2014  . Sepsis ([place]) 02/15/2014  . Absolute anemia 02/15/2014  . Diabetes mellitus with stage 3 chronic kidney disease ([place]) 02/15/2014  . CHF (congestive heart failure) ([place]) 02/15/2014  . CKD (chronic kidney disease) 02/15/2014  . UTI (lower urinary tract infection) 02/15/2014     Geraldine Solar PT, DPT   [facility] [address] [place], [place], [address] Phone: [phone]   Fax:  [phone]  Name: ALEIGHA GILANI MRN: [id number] Date of Birth: May 22, 1951

## 2016-12-10 NOTE — Telephone Encounter (Signed)
ROUTING TO DR HARRISON FOR APPROVAL 

## 2016-12-10 NOTE — Telephone Encounter (Signed)
Patient requests refill on Hydrocodone/Acetaminophen 5-325 mgs.  Qty  30       Sig: Take 1 tablet by mouth every 4 (four) hours as needed for moderate pain.

## 2016-12-11 ENCOUNTER — Other Ambulatory Visit: Payer: Self-pay | Admitting: Orthopedic Surgery

## 2016-12-11 MED ORDER — HYDROCODONE-ACETAMINOPHEN 5-325 MG PO TABS: 1.0000 | ORAL_TABLET | Freq: Three times a day (TID) | ORAL | 0 refills | Status: DC | PRN

## 2016-12-11 NOTE — Progress Notes (Signed)
[place] controlled substance reporting system reviewed  

## 2016-12-15 ENCOUNTER — Ambulatory Visit (HOSPITAL_COMMUNITY): Payer: Medicare Other | Admitting: Physical Therapy

## 2016-12-15 ENCOUNTER — Ambulatory Visit (INDEPENDENT_AMBULATORY_CARE_PROVIDER_SITE_OTHER): Payer: Medicare Other | Admitting: Gastroenterology

## 2016-12-15 ENCOUNTER — Encounter: Payer: Self-pay | Admitting: Gastroenterology

## 2016-12-15 VITALS — BP 120/69 | HR 78 | Temp 98.0°F | Ht 61.0 in | Wt 228.4 lb

## 2016-12-15 DIAGNOSIS — R262 Difficulty in walking, not elsewhere classified: Secondary | ICD-10-CM

## 2016-12-15 DIAGNOSIS — K7460 Unspecified cirrhosis of liver: Secondary | ICD-10-CM | POA: Diagnosis not present

## 2016-12-15 DIAGNOSIS — M25551 Pain in right hip: Secondary | ICD-10-CM | POA: Diagnosis not present

## 2016-12-15 DIAGNOSIS — R2681 Unsteadiness on feet: Secondary | ICD-10-CM

## 2016-12-15 DIAGNOSIS — M25651 Stiffness of right hip, not elsewhere classified: Secondary | ICD-10-CM

## 2016-12-15 DIAGNOSIS — K529 Noninfective gastroenteritis and colitis, unspecified: Secondary | ICD-10-CM | POA: Diagnosis not present

## 2016-12-15 MED ORDER — DIPHENOXYLATE-ATROPINE 2.5-0.025 MG PO TABS: ORAL_TABLET | ORAL | 3 refills | Status: DC

## 2016-12-15 NOTE — Assessment & Plan Note (Signed)
Chronic diarrhea, with last colonoscopy in 2015. Will plan on surveillance in 2020. IDA likely multifactorial, but I would like to obtain celiac serologies to have on file. Lomotil appears to work best for chronic diarrhea. No alarm symptoms.

## 2016-12-15 NOTE — Progress Notes (Signed)
Referring Provider: Vesta Mixer Primary Care Physician:  Antionette Fairy, PA-C Primary GI: Dr. Gala Romney   Chief Complaint  Patient presents with  . Cirrhosis    f/u, doing ok    HPI:   Penny Martinez is a 66 y.o. female presenting today with a history of likely NASH cirrhosis, due for US abdomen now. Chronic diarrhea, with Lomotil working well historically. History of IDA, followed by hematology.   Has issues with sciatica, just had left hand carpal tunnel repair. No abdominal pain. No N/V. Weight is stable. Lomotil chronically. If going somewhere, has to take it. Not on stools are loose. On non-selective beta-blocker for primary bleeding prophylaxis (propanolol 20 mg daily). Heart rate 78 today.   Past Medical History:  Diagnosis Date  . Acid reflux   . Anemia   . Arthritis   . C. difficile colitis June/July 2015  . Cancer ([place])    skin cancer  . CHF (congestive heart failure) ([place])   . 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 Negative viral markers 2015  . Complication of anesthesia   . COPD (chronic obstructive pulmonary disease) ([place])   . Diabetes mellitus without complication ([place])   . GERD (gastroesophageal reflux disease)   . History of kidney stones   . Hypercholesteremia   . Hypertension   . Hypothyroidism   . IBS (irritable bowel syndrome)   . Kidney disease   . Liver disease   . Neuropathy ([place])   . PONV (postoperative nausea and vomiting)   . Sleep apnea   . Thyroid disease     Past Surgical History:  Procedure Laterality Date  . ABDOMINAL HYSTERECTOMY    . APPENDECTOMY    . CAROTID ARTERY - SUBCLAVIAN ARTERY BYPASS GRAFT Right   . CARPAL TUNNEL RELEASE Right   . CARPAL TUNNEL RELEASE Left 12/03/2016   Procedure: CARPAL TUNNEL RELEASE;  Surgeon: Carole Civil, MD;  Location: [facility];  Service: Orthopedics;  Laterality: Left;  . CATARACT EXTRACTION Bilateral   . CHOLECYSTECTOMY    . COLONOSCOPY N/A 02/19/2014   Dr. Gala Romney: rectal  varices, rectal polyp overlying a varix non-manipulated  . ESOPHAGOGASTRODUODENOSCOPY N/A 02/19/2014   Dr. Gala Romney: esophageal varices, multiple gastric polyps with largest biopsied and hyperplastic. Negative H.pylori  . GIVENS CAPSULE STUDY N/A 05/04/2014   multiple polypoid-like lesions throughout small bowel, scattered superficial non-bleeding erosions more distal bowel, referred to [facility] for enteroscopy. Capsule study not complete, poor images.   Marland Kitchen TRIGGER FINGER RELEASE Left 12/03/2016   Procedure: RELEASE TRIGGER FINGER/A-1 PULLEY LEFT LONG TRIGGER FINGER RELEASE;  Surgeon: Carole Civil, MD;  Location: [facility]S;  Service: Orthopedics;  Laterality: Left;  . urosepsis      Current Outpatient Prescriptions  Medication Sig Dispense Refill  . amitriptyline (ELAVIL) 25 MG tablet Take 25 mg by mouth at bedtime.    Marland Kitchen aspirin 81 MG tablet Take 81 mg by mouth daily.    . baclofen (LIORESAL) 10 MG tablet Take 10 mg by mouth 3 (three) times daily.    . benazepril (LOTENSIN) 20 MG tablet Take 20 mg by mouth daily.    Marland Kitchen CRANBERRY-VITAMIN C PO Take 2 capsules by mouth at bedtime. Dosage is the 25,200 mg cranberry/vitamin C supplement    . diphenhydrAMINE (SOMINEX) 25 MG tablet Take 25 mg by mouth at bedtime. Reported on 01/31/2016    . diphenoxylate-atropine (LOMOTIL) 2.5-0.025 MG tablet 1-2 tablets every 4-6 hours as needed for diarrhea.  120 tablet 3  . ezetimibe (ZETIA) 10 MG tablet Take 10 mg by mouth daily.    . furosemide (LASIX) 80 MG tablet Take 80 mg by mouth daily.    Marland Kitchen gabapentin (NEURONTIN) 100 MG capsule TAKE (1) CAPSULE BY MOUTH THREE TIMES DAILY 90 capsule 2  . HUMULIN R U-500 KWIKPEN 500 UNIT/ML kwikpen INJECT 130 UNITS S.Q. THREE TIMES DAILY WITH MEALS. 18 mL 0  . HYDROcodone-acetaminophen (NORCO/VICODIN) 5-325 MG tablet Take 1 tablet by mouth every 8 (eight) hours as needed for moderate pain. 15 tablet 0  . lansoprazole (PREVACID) 30 MG capsule Take 30 mg by mouth daily.    Marland Kitchen  levothyroxine (SYNTHROID, LEVOTHROID) 100 MCG tablet Take 1 tablet (100 mcg total) by mouth daily. 90 tablet 1  . nitrofurantoin, macrocrystal-monohydrate, (MACROBID) 100 MG capsule Take 100 mg by mouth at bedtime.  1  . potassium chloride (K-DUR) 10 MEQ tablet Take 10 mEq by mouth daily.    . propranolol (INDERAL) 10 MG tablet Take 2 tablets (20 mg total) by mouth daily. 60 tablet 11  . ULTICARE SHORT PEN NEEDLES 31G X 8 MM MISC USE AS DIRECTED THREE TIMES DAILY. 100 each 5  . VICTOZA 18 MG/3ML SOPN INJECT 1.8 MG S.Q. ONCE DAILY. 9 mL 2  . Multiple Vitamins-Minerals (CENTRUM SILVER PO) Take 1 tablet by mouth daily.      No current facility-administered medications for this visit.     Allergies as of 12/15/2016 - Review Complete 12/15/2016  Allergen Reaction Noted  . Erythromycin Hives 02/15/2014  . Niacin Rash and Shortness Of Breath 03/05/2014  . Penicillins Shortness Of Breath 02/15/2014  . Sulfa antibiotics Hives 02/15/2014  . Statins  03/05/2014  . Codeine Itching 11/26/2016    Family History  Problem Relation Age of Onset  . Diabetes Mother   . Hypertension Mother   . Kidney failure Mother   . Blindness Mother   . Aneurysm Father   . Colon cancer Neg Hx     Social History   Social History  . Marital status: Married    Spouse name: N/A  . Number of children: N/A  . Years of education: N/A   Social History Main Topics  . Smoking status: Former Smoker    Packs/day: 2.00    Years: 35.00    Quit date: 05/05/2003  . Smokeless tobacco: Former Systems developer     Comment: Quit smoking 11 years ago  . Alcohol use No  . Drug use: No  . Sexual activity: Not Currently    Birth control/ protection: Surgical   Other Topics Concern  . None   Social History Narrative  . None    Review of Systems: As mentioned in HPI   Physical Exam: BP 120/69   Pulse 78   Temp 98 F (36.7 C) (Oral)   Ht 5' 1"  (1.549 m)   Wt 228 lb 6.4 oz (103.6 kg)   BMI 43.16 kg/m  General:   Alert  and oriented. No distress noted. Pleasant and cooperative.  Head:  Normocephalic and atraumatic. Eyes:  Conjuctiva clear without scleral icterus. Mouth:  Oral mucosa pink and moist. Good dentition. No lesions. Heart:  S1, S2 present with systolic murmur Abdomen:  +BS, soft, obese, non-tender and non-distended. No rebound or guarding. Possible small umbilical hernia noted.  Msk:  Symmetrical without gross deformities. Normal posture. Extremities:  With trace edema lower extremities  Neurologic:  Alert and  oriented x4;  grossly normal neurologically. Psych:  Alert and  cooperative. Normal mood and affect.  Lab Results  Component Value Date   ALT 30 09/30/2016   AST 54 (H) 09/30/2016   ALKPHOS 94 09/30/2016   BILITOT 0.7 09/30/2016   Lab Results  Component Value Date   CREATININE 1.03 (H) 12/01/2016   BUN 25 (H) 12/01/2016   NA 136 12/01/2016   K 4.1 12/01/2016   CL 99 (L) 12/01/2016   CO2 29 12/01/2016   Lab Results  Component Value Date   WBC 4.2 12/01/2016   HGB 11.7 (L) 12/01/2016   HCT 35.7 (L) 12/01/2016   MCV 95.2 12/01/2016   PLT 80 (L) 12/01/2016

## 2016-12-15 NOTE — Assessment & Plan Note (Signed)
66 year old female with likely NASH cirrhosis, due for US abdomen now. Continue propanolol for variceal bleeding prophylaxis; I have asked her to monitor her resting heart rate at home, with goal of 55 bpm. She is to call me after about a week, as this may need to be titrated. No EGD unless evidence of bleeding, decompensation. US abdomen in June 2018 per her request. Will have her return in 6 months.

## 2016-12-15 NOTE — Patient Instructions (Signed)
Please have blood work done when you are able.   We will order an ultrasound in June 2018.   I will see you in 6 months! In the meantime, check your heart rate when you are sitting at rest on several different occasions. Our goal is 55. Let me know what it is running in the next week or so. We may need to adjust the propanolol.

## 2016-12-15 NOTE — Therapy (Signed)
[place], [place], [address] Phone: [phone]   Fax:  [phone]  Physical Therapy Treatment  Patient Details  Name: Penny Martinez MRN: [id number] Date of Birth: 14-Mar-1951 Referring Provider: Sanjuana Kava  Encounter Date: 12/15/2016      PT End of Session - 12/15/16 1605    Visit Number 9   Number of Visits 12   Date for PT Re-Evaluation 01/07/17   Authorization Type Medicare (traditional)    Authorization Time Period 11/02/16-12/14/16   Authorization - Visit Number 9   Authorization - Number of Visits 10   PT Start Time 3220   PT Stop Time 1558   PT Time Calculation (min) 31 min   Activity Tolerance Patient tolerated treatment well;No increased pain  Reports of pain reduced and no radicular symptoms at EOS   Behavior During Therapy [facility] for tasks assessed/performed      Past Medical History:  Diagnosis Date  . Acid reflux   . Anemia   . Arthritis   . C. difficile colitis June/July 2015  . Cancer ([place])    skin cancer  . CHF (congestive heart failure) ([place])   . 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 Negative viral markers 2015  . Complication of anesthesia   . COPD (chronic obstructive pulmonary disease) ([place])   . Diabetes mellitus without complication ([place])   . GERD (gastroesophageal reflux disease)   . History of kidney stones   . Hypercholesteremia   . Hypertension   . Hypothyroidism   . IBS (irritable bowel syndrome)   . Kidney disease   . Liver disease   . Neuropathy ([place])   . PONV (postoperative nausea and vomiting)   . Sleep apnea   . Thyroid disease     Past Surgical History:  Procedure Laterality Date  . ABDOMINAL HYSTERECTOMY    . APPENDECTOMY    . CAROTID ARTERY - SUBCLAVIAN ARTERY BYPASS GRAFT Right   . CARPAL TUNNEL RELEASE Right   . CARPAL TUNNEL RELEASE Left 12/03/2016   Procedure: CARPAL TUNNEL RELEASE;  Surgeon: Carole Civil, MD;  Location: [facility];  Service:  Orthopedics;  Laterality: Left;  . CATARACT EXTRACTION Bilateral   . CHOLECYSTECTOMY    . COLONOSCOPY N/A 02/19/2014   Dr. Gala Romney: rectal varices, rectal polyp overlying a varix non-manipulated  . ESOPHAGOGASTRODUODENOSCOPY N/A 02/19/2014   Dr. Gala Romney: esophageal varices, multiple gastric polyps with largest biopsied and hyperplastic. Negative H.pylori  . GIVENS CAPSULE STUDY N/A 05/04/2014   multiple polypoid-like lesions throughout small bowel, scattered superficial non-bleeding erosions more distal bowel, referred to [facility] for enteroscopy. Capsule study not complete, poor images.   Marland Kitchen TRIGGER FINGER RELEASE Left 12/03/2016   Procedure: RELEASE TRIGGER FINGER/A-1 PULLEY LEFT LONG TRIGGER FINGER RELEASE;  Surgeon: Carole Civil, MD;  Location: [facility];  Service: Orthopedics;  Laterality: Left;  . urosepsis      There were no vitals filed for this visit.      Subjective Assessment - 12/15/16 1536    Subjective Pt was 11 minutes late for appointment. Pt states she is hurting all over today.  States her Lt hip kept waking her up last night.  Rates 6/10 pain today.   Currently in Pain? Yes   Pain Score 6    Pain Location Generalized                         [facility] Adult PT  Treatment/Exercise - 12/15/16 0001      Ambulation/Gait   Ambulation Distance (Feet) 585 Feet  5 minutes   Assistive device None   Ambulation Surface Level   Gait Comments step through, bil decr hip ext, bil trendelenberg, wide BOS, antalgic on R, decr PF/pushoff bil     Lumbar Exercises: Standing   Other Standing Lumbar Exercises standing marches 2x10 BLE     Lumbar Exercises: Seated   LAQ on Chair Limitations bil rowing without RTB ( due to recent wrist surgery) while seated, 3x10, cues to decrease UT elevation and stabilizing core   Sit to Stand 10 reps   Sit to Stand Limitations no UE's     Lumbar Exercises: Supine   Clam 10 reps   Heel Slides 10 reps   Heel Slides Limitations floating  with abdominal stability   Bridge 10 reps   Bridge Limitations 2 sets   Other Supine Lumbar Exercises deadbugs 2x10 with diaphragmatic breathing                 PT Education - 12/15/16 1537    Education provided Yes   Education Details compression garments for swelling in LE's.  Given information regarding Elastic Therapy in [place].   Person(s) Educated Patient   Methods Explanation;Handout   Comprehension Verbalized understanding          PT Short Term Goals - 12/10/16 1035      PT SHORT TERM GOAL #1   Title After 3 weeks pt will reports increased tolerance to AMB and prolonged standing by 100%.    Status On-going     PT SHORT TERM GOAL #2   Title After 3 weeks patient will demonstrate improved strength R ankle and knee AEB improved MMT by half a grade.    Baseline 4/5: 4/5, was 4-/5   Status Achieved           PT Long Term Goals - 12/10/16 1050      PT LONG TERM GOAL #1   Title After 6 weeks patient will demonstrate return to PLOF in IADL and community access AEB tolerance of 6MWT covering 1350 feet.    Baseline 4/5: 676f with 1 seated rest break due to BLE weakness (R>L)   Status On-going     PT LONG TERM GOAL #2   Title After 6 weeks patient will demonstrate improved funcitonal strength AEB 5xSTS in <16 seconds hands free and pain free.    Baseline 4/5: 17.5sec   Status On-going     PT LONG TERM GOAL #3   Title After 6 weeks patient will demonstrate improved functional hip strength and stability AEB SLS >10sec bilat.    Baseline 4/5: L = 4 sec with light UE assist, R = 3 sec with light UE assist   Status On-going               Plan - 12/15/16 1602    Clinical Impression Statement Pt late for appt and requested to leave a couple minutes early.  Unable to add postural theraband exercises this session as patient unable to grasp band due to recent carpal tunnel surgery.  Encouaged pateint to purchase compression garments and educatieon on this  as with noted swelling in LE's.  Also encouarged increasing aerobic actvitiy.  Pt hesitant but able to complete 5 minutes of ambuation at end of session.  Added additional core stability therex this session with cues to complete slowly/controlled with extremity isolation.     Rehab Potential  Fair   Clinical Impairments Affecting Rehab Potential Morbid obesity, history of spondylosis   PT Frequency 2x / week   PT Duration 4 weeks   PT Treatment/Interventions Balance training;Dry needling;Manual techniques;Passive range of motion;Therapeutic exercise;Therapeutic activities;Gait training;Moist Heat;Energy conservation;Functional mobility training   PT Next Visit Plan Continue with flexion based program per evaluating therapist.  Continue to promote gradual tolerance to extension-based activities (standing exercises, walking, etc.);.  Progress balance activities and ambulation time/distance.   PT Home Exercise Plan Asked to keep a journal on water intake. 3/6:  piriformis stretch (seated/supine), side lying clams, bridge (pt asked to DC any HEP actiivty that worsens radicular pain); Right chair rotation stretch. 4/5: deadbugs with RTB around BLE   Consulted and Agree with Plan of Care Patient      Patient will benefit from skilled therapeutic intervention in order to improve the following deficits and impairments:  Abnormal gait, Improper body mechanics, Increased fascial restricitons, Pain, Postural dysfunction, Decreased activity tolerance, Decreased balance, Decreased range of motion, Decreased strength, Difficulty walking, Obesity  Visit Diagnosis: Pain in right hip  Stiffness of right hip, not elsewhere classified  Unsteadiness on feet  Difficulty in walking, not elsewhere classified     Problem List Patient Active Problem List   Diagnosis Date Noted  . Carpal tunnel syndrome, left   . Trigger middle finger of left hand   . Thrombocytopenia ([place]) 11/04/2016  . Iron deficiency anemia  due to chronic blood loss 01/31/2016  . Other specified hypothyroidism 09/20/2015  . Essential hypertension, benign 07/12/2015  . Morbid obesity with BMI of 40.0-44.9, adult ([place]) 07/12/2015  . Hepatic cirrhosis ([place]) 04/02/2014  . Acute colitis 03/05/2014  . Abdominal pain, acute, left lower quadrant 03/05/2014  . Colitis 03/05/2014  . Splenomegaly, congestive, chronic 02/17/2014  . Sepsis ([place]) 02/15/2014  . Absolute anemia 02/15/2014  . Diabetes mellitus with stage 3 chronic kidney disease ([place]) 02/15/2014  . CHF (congestive heart failure) ([place]) 02/15/2014  . CKD (chronic kidney disease) 02/15/2014  . UTI (lower urinary tract infection) 02/15/2014    Teena Irani, PTA/CLT [phone]  12/15/2016, 4:06 PM  [facility] [address] [place], [place], [address] Phone: [phone]   Fax:  [phone]  Name: Penny Martinez MRN: [id number] Date of Birth: 10-20-1950

## 2016-12-16 NOTE — Progress Notes (Signed)
cc'ed to pcp °

## 2016-12-17 ENCOUNTER — Ambulatory Visit (HOSPITAL_COMMUNITY): Payer: Medicare Other

## 2016-12-18 ENCOUNTER — Ambulatory Visit (INDEPENDENT_AMBULATORY_CARE_PROVIDER_SITE_OTHER): Payer: Self-pay | Admitting: Orthopedic Surgery

## 2016-12-18 ENCOUNTER — Ambulatory Visit (HOSPITAL_COMMUNITY): Payer: Medicare Other

## 2016-12-18 DIAGNOSIS — M25551 Pain in right hip: Secondary | ICD-10-CM | POA: Diagnosis not present

## 2016-12-18 DIAGNOSIS — R2681 Unsteadiness on feet: Secondary | ICD-10-CM

## 2016-12-18 DIAGNOSIS — Z9889 Other specified postprocedural states: Secondary | ICD-10-CM

## 2016-12-18 DIAGNOSIS — R262 Difficulty in walking, not elsewhere classified: Secondary | ICD-10-CM

## 2016-12-18 DIAGNOSIS — Z4889 Encounter for other specified surgical aftercare: Secondary | ICD-10-CM

## 2016-12-18 DIAGNOSIS — M25651 Stiffness of right hip, not elsewhere classified: Secondary | ICD-10-CM

## 2016-12-18 NOTE — Therapy (Signed)
PHYSICAL THERAPY DISCHARGE SUMMARY  Visits from Start of Care: 10  Current functional level related to goals / functional outcomes: *see below   Remaining deficits: *see below   Education / Equipment: *see below Plan: Patient agrees to discharge.  Patient goals were not met. Patient is being discharged due to the patient's request.  ?????         Pt will be changing PT services to a clinic much closer to home as she drives 45 minutes to come to this clinic. She is DC at this time and is scheduled to be evaluated in Tuesday 12/22/16.   4:51 PM, 12/18/16 Etta Grandchild, PT, DPT Physical Therapist at [facility] [phone] (office)                [facility] [address] [place], [place], [address] Phone: [phone]   Fax:  [phone]  Physical Therapy Treatment  Patient Details  Name: Penny Martinez MRN: [id number] Date of Birth: 03/24/1951 Referring Provider: Sanjuana Kava  Encounter Date: 12/18/2016      PT End of Session - 12/18/16 1643    Visit Number 10   Number of Visits 12   Date for PT Re-Evaluation 01/07/17   Authorization Type Medicare (traditional)    Authorization Time Period 11/02/16-12/14/16   Authorization - Visit Number 10   Authorization - Number of Visits 10   PT Start Time 1301   PT Stop Time 1343   PT Time Calculation (min) 42 min   Activity Tolerance Patient tolerated treatment well;No increased pain   Behavior During Therapy WFL for tasks assessed/performed      Past Medical History:  Diagnosis Date  . Acid reflux   . Anemia   . Arthritis   . C. difficile colitis June/July 2015  . Cancer ([place])    skin cancer  . CHF (congestive heart failure) ([place])   . 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 Negative viral markers 2015  . Complication of anesthesia   . COPD (chronic obstructive pulmonary disease) ([place])   . Diabetes mellitus without  complication ([place])   . GERD (gastroesophageal reflux disease)   . History of kidney stones   . Hypercholesteremia   . Hypertension   . Hypothyroidism   . IBS (irritable bowel syndrome)   . Kidney disease   . Liver disease   . Neuropathy ([place])   . PONV (postoperative nausea and vomiting)   . Sleep apnea   . Thyroid disease     Past Surgical History:  Procedure Laterality Date  . ABDOMINAL HYSTERECTOMY    . APPENDECTOMY    . CAROTID ARTERY - SUBCLAVIAN ARTERY BYPASS GRAFT Right   . CARPAL TUNNEL RELEASE Right   . CARPAL TUNNEL RELEASE Left 12/03/2016   Procedure: CARPAL TUNNEL RELEASE;  Surgeon: Carole Civil, MD;  Location: [facility];  Service: Orthopedics;  Laterality: Left;  . CATARACT EXTRACTION Bilateral   . CHOLECYSTECTOMY    . COLONOSCOPY N/A 02/19/2014   Dr. Gala Romney: rectal varices, rectal polyp overlying a varix non-manipulated  . ESOPHAGOGASTRODUODENOSCOPY N/A 02/19/2014   Dr. Gala Romney: esophageal varices, multiple gastric polyps with largest biopsied and hyperplastic. Negative H.pylori  . GIVENS CAPSULE STUDY N/A 05/04/2014   multiple polypoid-like lesions throughout small bowel, scattered superficial non-bleeding erosions more distal bowel, referred to [facility] for [facility]oscopy. Capsule study not complete, poor images.   Marland Kitchen TRIGGER FINGER RELEASE Left 12/03/2016   Procedure: RELEASE  TRIGGER FINGER/A-1 PULLEY LEFT LONG TRIGGER FINGER RELEASE;  Surgeon: Carole Civil, MD;  Location: [facility];  Service: Orthopedics;  Laterality: Left;  . urosepsis      There were no vitals filed for this visit.                       [facility] Adult PT Treatment/Exercise - 12/18/16 0001      Lumbar Exercises: Stretches   Double Knee to Chest Stretch 3 reps;30 seconds   Double Knee to Chest Stretch Limitations seated     Manual Therapy   Manual Therapy Myofascial release   Myofascial Release Bilat QL, Iliocostalis thoracis: 26 minutes          Trigger Point Dry Needling  - 12/18/16 1640    Consent Given? Yes   Education Handout Provided Yes  on 3/15   Muscles Treated Upper Body Quadratus Lumborum;Longissimus   Longissimus Response Twitch response elicited;Palpable increased muscle length  And Lumbar multifidus   Quadriceps Response Twitch response elicited;Palpable increased muscle length                PT Short Term Goals - 12/10/16 1035      PT SHORT TERM GOAL #1   Title After 3 weeks pt will reports increased tolerance to AMB and prolonged standing by 100%.    Status On-going     PT SHORT TERM GOAL #2   Title After 3 weeks patient will demonstrate improved strength R ankle and knee AEB improved MMT by half a grade.    Baseline 4/5: 4/5, was 4-/5   Status Achieved           PT Long Term Goals - 12/10/16 1050      PT LONG TERM GOAL #1   Title After 6 weeks patient will demonstrate return to PLOF in IADL and community access AEB tolerance of 6MWT covering 1350 feet.    Baseline 4/5: 649f with 1 seated rest break due to BLE weakness (R>L)   Status On-going     PT LONG TERM GOAL #2   Title After 6 weeks patient will demonstrate improved funcitonal strength AEB 5xSTS in <16 seconds hands free and pain free.    Baseline 4/5: 17.5sec   Status On-going     PT LONG TERM GOAL #3   Title After 6 weeks patient will demonstrate improved functional hip strength and stability AEB SLS >10sec bilat.    Baseline 4/5: L = 4 sec with light UE assist, R = 3 sec with light UE assist   Status On-going               Plan - 12/18/16 1644    Clinical Impression Statement Session brough focus back to heavy soft tissue work to improve lumbar flexion ROM as patient continues to remain stuck in lordosis. She toelrates dry needling well, as well as subsequent MFR to dorsal lumbar soft tissue, with improved soft tissue mobility and compliance, as well as reduced pain. Pt reports this will be her last session as she is [place]. Pt making good progress toward goals, but not all goals achieved. She will be DC at this time.    Rehab Potential Fair   Clinical Impairments Affecting Rehab Potential Morbid obesity, history of spondylosis   PT Frequency 2x / week   PT Duration 4 weeks   PT Treatment/Interventions Balance training;Dry needling;Manual techniques;Passive range of motion;Therapeutic exercise;Therapeutic  activities;Gait training;Moist Heat;Energy conservation;Functional mobility training   PT Next Visit Plan Pt now DC    Consulted and Agree with Plan of Care Patient      Patient will benefit from skilled therapeutic intervention in order to improve the following deficits and impairments:  Abnormal gait, Improper body mechanics, Increased fascial restricitons, Pain, Postural dysfunction, Decreased activity tolerance, Decreased balance, Decreased range of motion, Decreased strength, Difficulty walking, Obesity  Visit Diagnosis: Pain in right hip  Stiffness of right hip, not elsewhere classified  Unsteadiness on feet  Difficulty in walking, not elsewhere classified       G-Codes - Jan 16, 2017 1647    Functional Assessment Tool Used (Outpatient Only) Clinical Judgment    Functional Limitation Mobility: Walking and moving around   Mobility: Walking and Moving Around Goal Status [phone]) At least 20 percent but less than 40 percent impaired, limited or restricted   Mobility: Walking and Moving Around Discharge Status [phone]) At least 20 percent but less than 40 percent impaired, limited or restricted      Problem List Patient Active Problem List   Diagnosis Date Noted  . Chronic diarrhea 12/15/2016  . Carpal tunnel syndrome, left   . Trigger middle finger of left hand   . Thrombocytopenia ([place]) 11/04/2016  . Iron deficiency anemia due to chronic blood loss 01/31/2016  . Other specified hypothyroidism 09/20/2015  . Essential hypertension, benign 07/12/2015  . Morbid obesity with  BMI of 40.0-44.9, adult ([place]) 07/12/2015  . Hepatic cirrhosis ([place]) 04/02/2014  . Acute colitis 03/05/2014  . Abdominal pain, acute, left lower quadrant 03/05/2014  . Colitis 03/05/2014  . Splenomegaly, congestive, chronic 02/17/2014  . Sepsis ([place]) 02/15/2014  . Absolute anemia 02/15/2014  . Diabetes mellitus with stage 3 chronic kidney disease ([place]) 02/15/2014  . CHF (congestive heart failure) ([place]) 02/15/2014  . CKD (chronic kidney disease) 02/15/2014  . UTI (lower urinary tract infection) 02/15/2014   4:53 PM, 01-16-2017 Etta Grandchild, PT, DPT Physical Therapist at [facility] [phone] (office)     Etta Grandchild 2017-01-16, 4:49 PM  [facility] [address] [place], [place], [address] Phone: [phone]   Fax:  [phone]  Name: Penny Martinez MRN: [id number] Date of Birth: [birth year]

## 2016-12-18 NOTE — Progress Notes (Signed)
Patient ID: Penny Martinez, female   DOB: 1951-03-01, 66 y.o.   MRN: [id number]  POSTOP VISIT    Chief Complaint  Patient presents with  . Follow-up    Recheck on left CTR, DOS 12-03-16.     Encounter Diagnoses  Name Primary?  Marland Kitchen Aftercare following surgery Yes  . S/P carpal tunnel release     The patient actually had a carpal tunnel release and a trigger finger release both incisions look clean dry and intact sutures are removed patient has good range of motion in her numbness has resolved  Continue exercises follow-up 4 weeks  11:44 AM Arther Abbott, MD 12/18/2016

## 2016-12-21 ENCOUNTER — Telehealth (HOSPITAL_COMMUNITY): Payer: Self-pay

## 2016-12-21 NOTE — Telephone Encounter (Signed)
Theraphy office in [place] called states pt is going to move to their office. Pateint will need to sign information release to move records. NF 12/21/16

## 2016-12-21 NOTE — Telephone Encounter (Signed)
Pt called stated she will get Dr. Luna Glasgow to write a referral for the office in [facility] tomorrow and requested to be D/C at this time from our office. She needs PT closer to home. NF 12/21/16

## 2016-12-22 ENCOUNTER — Ambulatory Visit (INDEPENDENT_AMBULATORY_CARE_PROVIDER_SITE_OTHER): Payer: Medicare Other | Admitting: Orthopaedic Surgery

## 2016-12-22 ENCOUNTER — Ambulatory Visit (HOSPITAL_COMMUNITY): Payer: Medicare Other

## 2016-12-22 ENCOUNTER — Encounter: Payer: Self-pay | Admitting: Orthopaedic Surgery

## 2016-12-22 VITALS — BP 104/62 | HR 81 | Temp 97.9°F | Ht 61.0 in | Wt 229.0 lb

## 2016-12-22 DIAGNOSIS — Z9889 Other specified postprocedural states: Secondary | ICD-10-CM

## 2016-12-22 DIAGNOSIS — M5441 Lumbago with sciatica, right side: Secondary | ICD-10-CM | POA: Diagnosis not present

## 2016-12-22 MED ORDER — HYDROCODONE-ACETAMINOPHEN 5-325 MG PO TABS: 1.0000 | ORAL_TABLET | Freq: Three times a day (TID) | ORAL | 0 refills | Status: DC | PRN

## 2016-12-22 NOTE — Progress Notes (Signed)
Patient WC:BJSEGB Penny Martinez, female DOB:30-Sep-1950, 66 y.o. TDV:[id number]  Chief Complaint  Patient presents with  . Follow-up    LOW BACK PAIN    HPI  Penny Martinez is a 66 y.o. female who has lower back pain.  She has been going to PT and is going to change to a PT closer to her home.  She is making good progress.  She is post CT release by Dr. Aline Brochure and I will give 15 more pain pills.   She is doing well from that. HPI  Body mass index is 43.27 kg/m.  ROS  Review of Systems  HENT: Negative for congestion.   Respiratory: Positive for shortness of breath. Negative for cough.   Cardiovascular: Negative for chest pain and leg swelling.  Endocrine: Positive for cold intolerance.  Musculoskeletal: Positive for arthralgias and joint swelling.  Allergic/Immunologic: Positive for environmental allergies.    Past Medical History:  Diagnosis Date  . Acid reflux   . Anemia   . Arthritis   . C. difficile colitis June/July 2015  . Cancer ([place])    skin cancer  . CHF (congestive heart failure) ([place])   . 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 Negative viral markers 2015  . Complication of anesthesia   . COPD (chronic obstructive pulmonary disease) ([place])   . Diabetes mellitus without complication ([place])   . GERD (gastroesophageal reflux disease)   . History of kidney stones   . Hypercholesteremia   . Hypertension   . Hypothyroidism   . IBS (irritable bowel syndrome)   . Kidney disease   . Liver disease   . Neuropathy   . PONV (postoperative nausea and vomiting)   . Sleep apnea   . Thyroid disease     Past Surgical History:  Procedure Laterality Date  . ABDOMINAL HYSTERECTOMY    . APPENDECTOMY    . CAROTID ARTERY - SUBCLAVIAN ARTERY BYPASS GRAFT Right   . CARPAL TUNNEL RELEASE Right   . CARPAL TUNNEL RELEASE Left 12/03/2016   Procedure: CARPAL TUNNEL RELEASE;  Surgeon: Carole Civil, MD;  Location: [facility];  Service: Orthopedics;  Laterality: Left;  .  CATARACT EXTRACTION Bilateral   . CHOLECYSTECTOMY    . COLONOSCOPY N/A 02/19/2014   Dr. Gala Romney: rectal varices, rectal polyp overlying a varix non-manipulated  . ESOPHAGOGASTRODUODENOSCOPY N/A 02/19/2014   Dr. Gala Romney: esophageal varices, multiple gastric polyps with largest biopsied and hyperplastic. Negative H.pylori  . GIVENS CAPSULE STUDY N/A 05/04/2014   multiple polypoid-like lesions throughout small bowel, scattered superficial non-bleeding erosions more distal bowel, referred to [facility] for enteroscopy. Capsule study not complete, poor images.   Marland Kitchen TRIGGER FINGER RELEASE Left 12/03/2016   Procedure: RELEASE TRIGGER FINGER/A-1 PULLEY LEFT LONG TRIGGER FINGER RELEASE;  Surgeon: Carole Civil, MD;  Location: [facility];  Service: Orthopedics;  Laterality: Left;  . urosepsis      Family History  Problem Relation Age of Onset  . Diabetes Mother   . Hypertension Mother   . Kidney failure Mother   . Blindness Mother   . Aneurysm Father   . Colon cancer Neg Hx     Social History Social History  Substance Use Topics  . Smoking status: Former Smoker    Packs/day: 2.00    Years: 35.00    Quit date: 05/05/2003  . Smokeless tobacco: Former Systems developer     Comment: Quit smoking 11 years ago  . Alcohol use No    Allergies  Allergen Reactions  .  Erythromycin Hives  . Niacin Rash and Shortness Of Breath  . Penicillins Shortness Of Breath  . Sulfa Antibiotics Hives  . Statins     Other reaction(s): Muscle Pain Other reaction(s): Muscle Pain  . Codeine Itching    Current Outpatient Prescriptions  Medication Sig Dispense Refill  . amitriptyline (ELAVIL) 25 MG tablet Take 25 mg by mouth at bedtime.    Marland Kitchen aspirin 81 MG tablet Take 81 mg by mouth daily.    . baclofen (LIORESAL) 10 MG tablet Take 10 mg by mouth 3 (three) times daily.    . benazepril (LOTENSIN) 20 MG tablet Take 20 mg by mouth daily.    Marland Kitchen CRANBERRY-VITAMIN C PO Take 2 capsules by mouth at bedtime. Dosage is the 25,200 mg  cranberry/vitamin C supplement    . diphenhydrAMINE (SOMINEX) 25 MG tablet Take 25 mg by mouth at bedtime. Reported on 01/31/2016    . diphenoxylate-atropine (LOMOTIL) 2.5-0.025 MG tablet 1-2 tablets every 4-6 hours as needed for diarrhea. 120 tablet 3  . ezetimibe (ZETIA) 10 MG tablet Take 10 mg by mouth daily.    . furosemide (LASIX) 80 MG tablet Take 80 mg by mouth daily.    Marland Kitchen gabapentin (NEURONTIN) 100 MG capsule TAKE (1) CAPSULE BY MOUTH THREE TIMES DAILY 90 capsule 2  . HUMULIN R U-500 KWIKPEN 500 UNIT/ML kwikpen INJECT 130 UNITS S.Q. THREE TIMES DAILY WITH MEALS. 18 mL 0  . HYDROcodone-acetaminophen (NORCO/VICODIN) 5-325 MG tablet Take 1 tablet by mouth every 8 (eight) hours as needed for moderate pain. 15 tablet 0  . lansoprazole (PREVACID) 30 MG capsule Take 30 mg by mouth daily.    Marland Kitchen levothyroxine (SYNTHROID, LEVOTHROID) 100 MCG tablet Take 1 tablet (100 mcg total) by mouth daily. 90 tablet 1  . nitrofurantoin, macrocrystal-monohydrate, (MACROBID) 100 MG capsule Take 100 mg by mouth at bedtime.  1  . potassium chloride (K-DUR) 10 MEQ tablet Take 10 mEq by mouth daily.    . propranolol (INDERAL) 10 MG tablet Take 2 tablets (20 mg total) by mouth daily. 60 tablet 11  . ULTICARE SHORT PEN NEEDLES 31G X 8 MM MISC USE AS DIRECTED THREE TIMES DAILY. 100 each 5  . VICTOZA 18 MG/3ML SOPN INJECT 1.8 MG S.Q. ONCE DAILY. 9 mL 2   No current facility-administered medications for this visit.      Physical Exam  Blood pressure 104/62, pulse 81, temperature 97.9 F (36.6 C), height 5' 1"  (1.549 m), weight 229 lb (103.9 kg).  Constitutional: overall normal hygiene, normal nutrition, well developed, normal grooming, normal body habitus. Assistive device:none  Musculoskeletal: gait and station Limp none, muscle tone and strength are normal, no tremors or atrophy is present.  .  Neurological: coordination overall normal.  Deep tendon reflex/nerve stretch intact.  Sensation normal.  Cranial nerves  II-XII intact.   Skin:   Normal overall no scars, lesions, ulcers or rashes. No psoriasis.  Psychiatric: Alert and oriented x 3.  Recent memory intact, remote memory unclear.  Normal mood and affect. Well groomed.  Good eye contact.  Cardiovascular: overall no swelling, no varicosities, no edema bilaterally, normal temperatures of the legs and arms, no clubbing, cyanosis and good capillary refill.  Lymphatic: palpation is normal.  Spine/Pelvis examination:  Inspection:  Overall, sacoiliac joint benign and hips nontender; without crepitus or defects.   Thoracic spine inspection: Alignment normal without kyphosis present   Lumbar spine inspection:  Alignment  with normal lumbar lordosis, without scoliosis apparent.   Thoracic spine palpation:  without tenderness of spinal processes   Lumbar spine palpation: with tenderness of lumbar area; without tightness of lumbar muscles    Range of Motion:   Lumbar flexion, forward flexion is 45 without pain or tenderness    Lumbar extension is full without pain or tenderness   Left lateral bend is Normal  without pain or tenderness   Right lateral bend is Normal without pain or tenderness   Straight leg raising is Normal   Strength & tone: Normal   Stability overall normal stability      The patient has been educated about the nature of the problem(s) and counseled on treatment options.  The patient appeared to understand what I have discussed and is in agreement with it.  Encounter Diagnoses  Name Primary?  . Acute right-sided low back pain with right-sided sciatica Yes  . S/P carpal tunnel release     PLAN Call if any problems.  Precautions discussed.  Continue current medications.   Return to clinic 1 month   I have reviewed the [place] web site prior to prescribing narcotic medicine for this patient.  Continue PT.  Electronically Signed Sanjuana Kava, MD 4/17/20182:32 PM

## 2016-12-23 ENCOUNTER — Other Ambulatory Visit: Payer: Self-pay | Admitting: "Endocrinology

## 2016-12-24 ENCOUNTER — Ambulatory Visit (HOSPITAL_COMMUNITY): Payer: Medicare Other

## 2016-12-24 ENCOUNTER — Telehealth: Payer: Self-pay | Admitting: "Endocrinology

## 2016-12-24 NOTE — Telephone Encounter (Signed)
Penny Martinez is calling stating that she thinks her lab orders are not correct, please advise?

## 2016-12-24 NOTE — Telephone Encounter (Signed)
Pt was missing 2 lab orders. I faxed these to [facility] were she will have these drawn.

## 2016-12-29 ENCOUNTER — Encounter (HOSPITAL_COMMUNITY): Payer: Medicare Other

## 2016-12-31 ENCOUNTER — Other Ambulatory Visit: Payer: Self-pay | Admitting: "Endocrinology

## 2016-12-31 ENCOUNTER — Encounter (HOSPITAL_COMMUNITY): Payer: Medicare Other

## 2016-12-31 LAB — COMPREHENSIVE METABOLIC PANEL
ALK PHOS: 91 U/L (ref 33–130)
ALT: 15 U/L (ref 6–29)
AST: 27 U/L (ref 10–35)
Albumin: 3.2 g/dL — ABNORMAL LOW (ref 3.6–5.1)
BILIRUBIN TOTAL: 0.4 mg/dL (ref 0.2–1.2)
BUN: 18 mg/dL (ref 7–25)
CALCIUM: 8.7 mg/dL (ref 8.6–10.4)
CO2: 24 mmol/L (ref 20–31)
Chloride: 102 mmol/L (ref 98–110)
Creat: 1.01 mg/dL — ABNORMAL HIGH (ref 0.50–0.99)
GLUCOSE: 194 mg/dL — AB (ref 65–99)
POTASSIUM: 4.4 mmol/L (ref 3.5–5.3)
Sodium: 139 mmol/L (ref 135–146)
TOTAL PROTEIN: 6.1 g/dL (ref 6.1–8.1)

## 2016-12-31 LAB — TSH: TSH: 2.07 mIU/L

## 2016-12-31 LAB — T4, FREE: Free T4: 1.3 ng/dL (ref 0.8–1.8)

## 2017-01-01 LAB — TISSUE TRANSGLUTAMINASE, IGA: Tissue Transglutaminase Ab, IgA: 1 U/mL (ref <4)

## 2017-01-01 LAB — HEMOGLOBIN A1C
Hgb A1c MFr Bld: 6.4 % — ABNORMAL HIGH (ref <5.7)
MEAN PLASMA GLUCOSE: 137 mg/dL

## 2017-01-01 LAB — IGA: IgA: 546 mg/dL — ABNORMAL HIGH (ref 81–463)

## 2017-01-05 ENCOUNTER — Other Ambulatory Visit: Payer: Self-pay | Admitting: "Endocrinology

## 2017-01-05 ENCOUNTER — Encounter (HOSPITAL_COMMUNITY): Payer: Medicare Other

## 2017-01-07 ENCOUNTER — Encounter (HOSPITAL_COMMUNITY): Payer: Medicare Other

## 2017-01-08 ENCOUNTER — Encounter: Payer: Self-pay | Admitting: "Endocrinology

## 2017-01-08 ENCOUNTER — Ambulatory Visit (INDEPENDENT_AMBULATORY_CARE_PROVIDER_SITE_OTHER): Payer: Medicare Other | Admitting: "Endocrinology

## 2017-01-08 VITALS — BP 107/63 | HR 77 | Ht 61.0 in | Wt 230.0 lb

## 2017-01-08 DIAGNOSIS — E038 Other specified hypothyroidism: Secondary | ICD-10-CM | POA: Diagnosis not present

## 2017-01-08 DIAGNOSIS — N183 Chronic kidney disease, stage 3 (moderate): Secondary | ICD-10-CM | POA: Diagnosis not present

## 2017-01-08 DIAGNOSIS — E1122 Type 2 diabetes mellitus with diabetic chronic kidney disease: Secondary | ICD-10-CM

## 2017-01-08 DIAGNOSIS — Z6841 Body Mass Index (BMI) 40.0 and over, adult: Secondary | ICD-10-CM

## 2017-01-08 DIAGNOSIS — I10 Essential (primary) hypertension: Secondary | ICD-10-CM

## 2017-01-08 MED ORDER — INSULIN REGULAR HUMAN (CONC) 500 UNIT/ML ~~LOC~~ SOPN: PEN_INJECTOR | SUBCUTANEOUS | 0 refills | Status: DC

## 2017-01-08 NOTE — Progress Notes (Signed)
Subjective:    Patient ID: Penny Martinez, female    DOB: April 25, 1951,    Past Medical History:  Diagnosis Date  . Acid reflux   . Anemia   . Arthritis   . C. difficile colitis June/July 2015  . Cancer ([place])    skin cancer  . CHF (congestive heart failure) ([place])   . 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 Negative viral markers 2015  . Complication of anesthesia   . COPD (chronic obstructive pulmonary disease) ([place])   . Diabetes mellitus without complication ([place])   . GERD (gastroesophageal reflux disease)   . History of kidney stones   . Hypercholesteremia   . Hypertension   . Hypothyroidism   . IBS (irritable bowel syndrome)   . Kidney disease   . Liver disease   . Neuropathy   . PONV (postoperative nausea and vomiting)   . Sleep apnea   . Thyroid disease    Past Surgical History:  Procedure Laterality Date  . ABDOMINAL HYSTERECTOMY    . APPENDECTOMY    . CAROTID ARTERY - SUBCLAVIAN ARTERY BYPASS GRAFT Right   . CARPAL TUNNEL RELEASE Right   . CARPAL TUNNEL RELEASE Left 12/03/2016   Procedure: CARPAL TUNNEL RELEASE;  Surgeon: Carole Civil, MD;  Location: [facility];  Service: Orthopedics;  Laterality: Left;  . CATARACT EXTRACTION Bilateral   . CHOLECYSTECTOMY    . COLONOSCOPY N/A 02/19/2014   Dr. Gala Romney: rectal varices, rectal polyp overlying a varix non-manipulated  . ESOPHAGOGASTRODUODENOSCOPY N/A 02/19/2014   Dr. Gala Romney: esophageal varices, multiple gastric polyps with largest biopsied and hyperplastic. Negative H.pylori  . GIVENS CAPSULE STUDY N/A 05/04/2014   multiple polypoid-like lesions throughout small bowel, scattered superficial non-bleeding erosions more distal bowel, referred to [facility] for enteroscopy. Capsule study not complete, poor images.   Marland Kitchen TRIGGER FINGER RELEASE Left 12/03/2016   Procedure: RELEASE TRIGGER FINGER/A-1 PULLEY LEFT LONG TRIGGER FINGER RELEASE;  Surgeon: Carole Civil, MD;  Location: [facility];  Service: Orthopedics;  Laterality:  Left;  . urosepsis     Social History   Social History  . Marital status: Married    Spouse name: N/A  . Number of children: N/A  . Years of education: N/A   Social History Main Topics  . Smoking status: Former Smoker    Packs/day: 2.00    Years: 35.00    Quit date: 05/05/2003  . Smokeless tobacco: Former Systems developer     Comment: Quit smoking 11 years ago  . Alcohol use No  . Drug use: No  . Sexual activity: Not Currently    Birth control/ protection: Surgical   Other Topics Concern  . None   Social History Narrative  . None   Outpatient Encounter Prescriptions as of 01/08/2017  Medication Sig  . amitriptyline (ELAVIL) 25 MG tablet Take 25 mg by mouth at bedtime.  Marland Kitchen aspirin 81 MG tablet Take 81 mg by mouth daily.  . baclofen (LIORESAL) 10 MG tablet Take 10 mg by mouth 3 (three) times daily.  . benazepril (LOTENSIN) 20 MG tablet Take 20 mg by mouth daily.  Marland Kitchen CRANBERRY-VITAMIN C PO Take 2 capsules by mouth at bedtime. Dosage is the 25,200 mg cranberry/vitamin C supplement  . diphenhydrAMINE (SOMINEX) 25 MG tablet Take 25 mg by mouth at bedtime. Reported on 01/31/2016  . diphenoxylate-atropine (LOMOTIL) 2.5-0.025 MG tablet 1-2 tablets every 4-6 hours as needed for diarrhea.  . ezetimibe (ZETIA) 10 MG tablet Take 10 mg  by mouth daily.  . furosemide (LASIX) 80 MG tablet Take 80 mg by mouth daily.  Marland Kitchen gabapentin (NEURONTIN) 100 MG capsule TAKE (1) CAPSULE BY MOUTH THREE TIMES DAILY  . HYDROcodone-acetaminophen (NORCO/VICODIN) 5-325 MG tablet Take 1 tablet by mouth every 8 (eight) hours as needed for moderate pain.  Marland Kitchen insulin regular human CONCENTRATED (HUMULIN R U-500 KWIKPEN) 500 UNIT/ML kwikpen INJECT 120 UNITS S.Q. THREE TIMES DAILY WITH MEALS.  Marland Kitchen lansoprazole (PREVACID) 30 MG capsule Take 30 mg by mouth daily.  Marland Kitchen levothyroxine (SYNTHROID, LEVOTHROID) 100 MCG tablet TAKE 1 TABLET BY MOUTH ONCE DAILY.  . nitrofurantoin, macrocrystal-monohydrate, (MACROBID) 100 MG capsule Take 100 mg by  mouth at bedtime.  . potassium chloride (K-DUR) 10 MEQ tablet Take 10 mEq by mouth daily.  . propranolol (INDERAL) 10 MG tablet Take 2 tablets (20 mg total) by mouth daily.  Marland Kitchen ULTICARE SHORT PEN NEEDLES 31G X 8 MM MISC USE AS DIRECTED THREE TIMES DAILY.  Marland Kitchen VICTOZA 18 MG/3ML SOPN INJECT 1.8 MG S.Q. ONCE DAILY.  . [DISCONTINUED] HUMULIN R U-500 KWIKPEN 500 UNIT/ML kwikpen INJECT 130 UNITS S.Q. THREE TIMES DAILY WITH MEALS.   No facility-administered encounter medications on file as of 01/08/2017.    ALLERGIES: Allergies  Allergen Reactions  . Erythromycin Hives  . Niacin Rash and Shortness Of Breath  . Penicillins Shortness Of Breath  . Sulfa Antibiotics Hives  . Statins     Other reaction(s): Muscle Pain Other reaction(s): Muscle Pain  . Codeine Itching   VACCINATION STATUS: Immunization History  Administered Date(s) Administered  . Influenza,inj,Quad PF,36+ Mos 07/24/2016    Diabetes  She presents for her follow-up diabetic visit. She has type 2 diabetes mellitus. Onset time: She was diagnosed at approximate age of 53 years. Her disease course has been improving. There are no hypoglycemic associated symptoms. Pertinent negatives for hypoglycemia include no confusion, headaches, pallor or seizures. Associated symptoms include fatigue, polydipsia and polyuria. Pertinent negatives for diabetes include no chest pain and no polyphagia. There are no hypoglycemic complications. Symptoms are improving. Diabetic complications include heart disease, nephropathy and PVD. Risk factors for coronary artery disease include diabetes mellitus, dyslipidemia, obesity, hypertension, sedentary lifestyle and tobacco exposure. Current diabetic treatment includes intensive insulin program. She is compliant with treatment most of the time. Her weight is stable. She is following a generally unhealthy diet. Prior visit with dietitian: She declined referral to a dietitian. She never participates in exercise. Her  breakfast blood glucose range is generally 140-180 mg/dl. Her lunch blood glucose range is generally 140-180 mg/dl. Her dinner blood glucose range is generally 180-200 mg/dl. Her overall blood glucose range is 180-200 mg/dl. An ACE inhibitor/angiotensin II receptor blocker is being taken. Eye exam is current.  Hypertension  This is a chronic problem. The current episode started more than 1 year ago. The problem is controlled. Pertinent negatives include no chest pain, headaches, palpitations or shortness of breath. Risk factors for coronary artery disease include diabetes mellitus, dyslipidemia, obesity and sedentary lifestyle. Hypertensive end-organ damage includes PVD.  Hyperlipidemia  This is a chronic problem. The current episode started more than 1 year ago. Pertinent negatives include no chest pain, myalgias or shortness of breath. Risk factors for coronary artery disease include diabetes mellitus, dyslipidemia, hypertension, a sedentary lifestyle and obesity.     Review of Systems  Constitutional: Positive for fatigue. Negative for unexpected weight change.  HENT: Negative for trouble swallowing and voice change.   Eyes: Negative for visual disturbance.  Respiratory: Negative for  cough, shortness of breath and wheezing.   Cardiovascular: Negative for chest pain, palpitations and leg swelling.  Gastrointestinal: Negative for diarrhea, nausea and vomiting.  Endocrine: Positive for polydipsia and polyuria. Negative for cold intolerance, heat intolerance and polyphagia.  Musculoskeletal: Negative for arthralgias and myalgias.  Skin: Negative for color change, pallor, rash and wound.  Neurological: Negative for seizures and headaches.  Psychiatric/Behavioral: Negative for confusion and suicidal ideas.    Objective:    BP 107/63   Pulse 77   Ht 5' 1"  (1.549 m)   Wt 230 lb (104.3 kg)   BMI 43.46 kg/m   Wt Readings from Last 3 Encounters:  01/08/17 230 lb (104.3 kg)  12/22/16 229 lb  (103.9 kg)  12/15/16 228 lb 6.4 oz (103.6 kg)    Physical Exam  Constitutional: She is oriented to person, place, and time. She appears well-developed.  HENT:  Head: Normocephalic and atraumatic.  Eyes: EOM are normal.  Neck: Normal range of motion. Neck supple. No tracheal deviation present. No thyromegaly present.  Cardiovascular: Normal rate and regular rhythm.   Pulmonary/Chest: Effort normal and breath sounds normal.  Abdominal: Soft. Bowel sounds are normal. There is no tenderness. There is no guarding.  Musculoskeletal: Normal range of motion. She exhibits no edema.  Neurological: She is alert and oriented to person, place, and time. She has normal reflexes. No cranial nerve deficit. Coordination normal.  Skin: Skin is warm and dry. No rash noted. No erythema. No pallor.  Psychiatric: She has a normal mood and affect. Judgment normal.    Results for orders placed or performed in visit on 12/31/16  Comprehensive metabolic panel  Result Value Ref Range   Sodium 139 135 - 146 mmol/L   Potassium 4.4 3.5 - 5.3 mmol/L   Chloride 102 98 - 110 mmol/L   CO2 24 20 - 31 mmol/L   Glucose, Bld 194 (H) 65 - 99 mg/dL   BUN 18 7 - 25 mg/dL   Creat 1.01 (H) 0.50 - 0.99 mg/dL   Total Bilirubin 0.4 0.2 - 1.2 mg/dL   Alkaline Phosphatase 91 33 - 130 U/L   AST 27 10 - 35 U/L   ALT 15 6 - 29 U/L   Total Protein 6.1 6.1 - 8.1 g/dL   Albumin 3.2 (L) 3.6 - 5.1 g/dL   Calcium 8.7 8.6 - 10.4 mg/dL  TSH  Result Value Ref Range   TSH 2.07 mIU/L  T4, free  Result Value Ref Range   Free T4 1.3 0.8 - 1.8 ng/dL  Hemoglobin A1c  Result Value Ref Range   Hgb A1c MFr Bld 6.4 (H) <5.7 %   Mean Plasma Glucose 137 mg/dL  IgA  Result Value Ref Range   IgA 546 (H) 81 - 463 mg/dL  Tissue transglutaminase, IgA  Result Value Ref Range   Tissue Transglutaminase Ab, IgA 1 <4 U/mL   Complete Blood Count (Most recent): Lab Results  Component Value Date   WBC 4.2 12/01/2016   HGB 11.7 (L) 12/01/2016    HCT 35.7 (L) 12/01/2016   MCV 95.2 12/01/2016   PLT 80 (L) 12/01/2016    Diabetic Labs (most recent): Lab Results  Component Value Date   HGBA1C 6.4 (H) 12/31/2016   HGBA1C 7.5 (H) 09/30/2016   HGBA1C 8.1 06/25/2016     Assessment & Plan:   1. Uncontrolled type 2 diabetes mellitus with complication, with long-term current use of insulin ([place])  Her diabetes is  complicated by chronic kidney  disease and peripheral arterial disease as well as CHF.  patient remains at a high risk for more acute and chronic complications of diabetes which include CAD, CVA, CKD, retinopathy, and neuropathy. These are all discussed in detail with the patient.  Patient came with Out her meter nor logs, her A1c is up 6.4%, slowly improving from 8.1%. - He may show some rare random hypoglycemia.    Glucose logs and insulin administration records pertaining to this visit,  to be scanned into patient's records.  Recent labs reviewed.   - I have re-counseled the patient on diet management and weight loss  by adopting a carbohydrate restricted / protein rich  Diet.  - Suggestion is made for patient to avoid simple carbohydrates   from their diet including Cakes , Desserts, Ice Cream,  Soda (  diet and regular) , Sweet Tea , Candies,  Chips, Cookies, Artificial Sweeteners,   and "Sugar-free" Products .  This will help patient to have stable blood glucose profile and potentially avoid unintended  Weight gain.  - Patient is advised to stick to a routine mealtimes to eat 3 meals  a day and avoid unnecessary snacks ( to snack only to correct hypoglycemia).   - I have approached patient with the following individualized plan to manage diabetes and patient agrees.  - She will continue to need U500 insulin. -I will decrease Humulin  U500 to 120 units with breakfast, 120 units with lunch, and 120 units with supper for pre-meal glucose profile above 90 mg/dL associated with monitoring of blood glucose before meals and  at bedtime. -She will continue Victoza 1.8 mg.  -Patient is encouraged to call clinic for blood glucose levels less than 70 or above 300 mg /dl.  -Advised to avoid unnecessary evening snacks.  -she is not interested  to explore option of bariatric surgery. -She declined referral to the dietitian.  -Patient is not a candidate for metformin andSGLT2 inhibitors due to CKD. -   She is offered low dose dex supression test to screen endogenous steroid burden, waiting for results. 2015 abdominal CT, not dedicated to adrenals, showed normal adrenals anatomy.  - Patient specific target  for A1c; LDL, HDL, Triglycerides, and  Waist Circumference were discussed in detail.  2) BP/HTN:  controlled. Continue current medications including ACEI/ARB. 3) Lipids/HPL:  continue statins. 4)  Weight/Diet:  she decided not to consult with a dietitian, exercise, and carbohydrates information provided.  4) Primary hypothyroidism Her thyroid function tests are consistent with appropriate replacement. I will  continue levothyroxine 133mg po qam.   - We discussed about correct intake of levothyroxine, at fasting, with water, separated by at least 30 minutes from breakfast, and separated by more than 4 hours from calcium, iron, multivitamins, acid reflux medications (PPIs). -Patient is made aware of the fact that thyroid hormone replacement is needed for life, dose to be adjusted by periodic monitoring of thyroid function tests.  6) Chronic Care/Health Maintenance:  -Patient is  on ACEI/ARB and Statin medications and encouraged to continue to follow up with Ophthalmology, Podiatrist at least yearly or according to recommendations, and advised to  stay away from smoking. I have recommended yearly flu vaccine and pneumonia vaccination at least every 5 years; moderate intensity exercise for up to 150 minutes weekly; and  sleep for at least 7 hours a day.  I advised patient to maintain close follow up with her PCP for  primary care needs.  Patient is asked to bring meter and  blood glucose logs during their next visit.   Follow up plan: Return in about 3 months (around 04/10/2017) for meter, and logs.  Glade Lloyd, MD Phone: [phone]  Fax: [phone]   01/08/2017, 11:49 AM

## 2017-01-08 NOTE — Patient Instructions (Signed)

## 2017-01-12 ENCOUNTER — Telehealth: Payer: Self-pay | Admitting: "Endocrinology

## 2017-01-12 NOTE — Telephone Encounter (Signed)
Penny Martinez is asking to speak to Penny Martinez to get lab results , please advise?

## 2017-01-12 NOTE — Telephone Encounter (Signed)
Advised pt that the lab she went to states that they [facility]. However [facility] states that they never received the blood. Pt states she will call lab to find out where the specimen was sent.

## 2017-01-14 ENCOUNTER — Other Ambulatory Visit: Payer: Self-pay | Admitting: "Endocrinology

## 2017-01-15 ENCOUNTER — Ambulatory Visit (INDEPENDENT_AMBULATORY_CARE_PROVIDER_SITE_OTHER): Payer: Self-pay | Admitting: Orthopedic Surgery

## 2017-01-15 ENCOUNTER — Encounter: Payer: Self-pay | Admitting: Orthopedic Surgery

## 2017-01-15 DIAGNOSIS — Z9889 Other specified postprocedural states: Secondary | ICD-10-CM

## 2017-01-15 DIAGNOSIS — M6530 Trigger finger, unspecified finger: Secondary | ICD-10-CM

## 2017-01-15 NOTE — Progress Notes (Signed)
Chief Complaint  Patient presents with  . postop    LT CTR and LT trigger release 12/03/16    Patient is now postop day #52 after left carpal tunnel release left trigger release. Her carpal tunnel symptoms have improved and her triggering has been alleviated  She has tenderness at the scars of both surgeries  She has full range of motion  She will return in 4 weeks to recheck the left hand and discuss surgery on the right wrist  Encounter Diagnoses  Name Primary?  . S/P carpal tunnel release Yes  . Trigger finger, acquired

## 2017-01-20 ENCOUNTER — Encounter: Payer: Self-pay | Admitting: Orthopaedic Surgery

## 2017-01-20 ENCOUNTER — Ambulatory Visit (INDEPENDENT_AMBULATORY_CARE_PROVIDER_SITE_OTHER): Payer: Medicare Other | Admitting: Orthopaedic Surgery

## 2017-01-20 VITALS — BP 144/58 | HR 82 | Ht 61.0 in | Wt 233.0 lb

## 2017-01-20 DIAGNOSIS — M5441 Lumbago with sciatica, right side: Secondary | ICD-10-CM | POA: Diagnosis not present

## 2017-01-20 NOTE — Progress Notes (Signed)
Patient YQ:[id number] Penny Martinez, female DOB:[birth year], 66 y.o. VZD:[id number]  Chief Complaint  Patient presents with  . Back Pain    Lumbar    HPI  Penny Martinez is a 66 y.o. female who has had chronic lower back pain.  She went to PT and has done well.  She has little pain now. She is walking. She has no paresthesias.  She has no new trauma. HPI  Body mass index is 44.02 kg/m.  ROS  Review of Systems  HENT: Negative for congestion.   Respiratory: Positive for shortness of breath. Negative for cough.   Cardiovascular: Negative for chest pain and leg swelling.  Endocrine: Positive for cold intolerance.  Musculoskeletal: Positive for arthralgias and joint swelling.  Allergic/Immunologic: Positive for environmental allergies.    Past Medical History:  Diagnosis Date  . Acid reflux   . Anemia   . Arthritis   . C. difficile colitis June/July 2015  . Cancer ([place])    skin cancer  . CHF (congestive heart failure) ([place])   . 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 Negative viral markers 2015  . Complication of anesthesia   . COPD (chronic obstructive pulmonary disease) ([place])   . Diabetes mellitus without complication ([place])   . GERD (gastroesophageal reflux disease)   . History of kidney stones   . Hypercholesteremia   . Hypertension   . Hypothyroidism   . IBS (irritable bowel syndrome)   . Kidney disease   . Liver disease   . Neuropathy   . PONV (postoperative nausea and vomiting)   . Sleep apnea   . Thyroid disease     Past Surgical History:  Procedure Laterality Date  . ABDOMINAL HYSTERECTOMY    . APPENDECTOMY    . CAROTID ARTERY - SUBCLAVIAN ARTERY BYPASS GRAFT Right   . CARPAL TUNNEL RELEASE Right   . CARPAL TUNNEL RELEASE Left 12/03/2016   Procedure: CARPAL TUNNEL RELEASE;  Surgeon: Carole Civil, MD;  Location: [facility];  Service: Orthopedics;  Laterality: Left;  . CATARACT EXTRACTION Bilateral   . CHOLECYSTECTOMY    . COLONOSCOPY N/A 02/19/2014   Dr.  Gala Romney: rectal varices, rectal polyp overlying a varix non-manipulated  . ESOPHAGOGASTRODUODENOSCOPY N/A 02/19/2014   Dr. Gala Romney: esophageal varices, multiple gastric polyps with largest biopsied and hyperplastic. Negative H.pylori  . GIVENS CAPSULE STUDY N/A 05/04/2014   multiple polypoid-like lesions throughout small bowel, scattered superficial non-bleeding erosions more distal bowel, referred to [facility] for enteroscopy. Capsule study not complete, poor images.   Marland Kitchen TRIGGER FINGER RELEASE Left 12/03/2016   Procedure: RELEASE TRIGGER FINGER/A-1 PULLEY LEFT LONG TRIGGER FINGER RELEASE;  Surgeon: Carole Civil, MD;  Location: [facility];  Service: Orthopedics;  Laterality: Left;  . urosepsis      Family History  Problem Relation Age of Onset  . Diabetes Mother   . Hypertension Mother   . Kidney failure Mother   . Blindness Mother   . Aneurysm Father   . Colon cancer Neg Hx     Social History Social History  Substance Use Topics  . Smoking status: Former Smoker    Packs/day: 2.00    Years: 35.00    Quit date: 05/05/2003  . Smokeless tobacco: Former Systems developer     Comment: Quit smoking 11 years ago  . Alcohol use No    Allergies  Allergen Reactions  . Erythromycin Hives  . Niacin Rash and Shortness Of Breath  . Penicillins Shortness Of Breath  . Sulfa Antibiotics  Hives  . Statins     Other reaction(s): Muscle Pain Other reaction(s): Muscle Pain  . Codeine Itching    Current Outpatient Prescriptions  Medication Sig Dispense Refill  . amitriptyline (ELAVIL) 25 MG tablet Take 25 mg by mouth at bedtime.    Marland Kitchen aspirin 81 MG tablet Take 81 mg by mouth daily.    . baclofen (LIORESAL) 10 MG tablet Take 10 mg by mouth 3 (three) times daily.    . benazepril (LOTENSIN) 20 MG tablet Take 20 mg by mouth daily.    Marland Kitchen CRANBERRY-VITAMIN C PO Take 2 capsules by mouth at bedtime. Dosage is the 25,200 mg cranberry/vitamin C supplement    . diphenhydrAMINE (SOMINEX) 25 MG tablet Take 25 mg by mouth at  bedtime. Reported on 01/31/2016    . diphenoxylate-atropine (LOMOTIL) 2.5-0.025 MG tablet 1-2 tablets every 4-6 hours as needed for diarrhea. 120 tablet 3  . ezetimibe (ZETIA) 10 MG tablet Take 10 mg by mouth daily.    . furosemide (LASIX) 80 MG tablet Take 80 mg by mouth daily.    Marland Kitchen gabapentin (NEURONTIN) 100 MG capsule TAKE (1) CAPSULE BY MOUTH THREE TIMES DAILY 90 capsule 2  . HUMULIN R U-500 KWIKPEN 500 UNIT/ML kwikpen INJECT 130 UNITS S.Q. THREE TIMES DAILY WITH MEALS. 18 mL 0  . HYDROcodone-acetaminophen (NORCO/VICODIN) 5-325 MG tablet Take 1 tablet by mouth every 8 (eight) hours as needed for moderate pain. 15 tablet 0  . lansoprazole (PREVACID) 30 MG capsule Take 30 mg by mouth daily.    Marland Kitchen levothyroxine (SYNTHROID, LEVOTHROID) 100 MCG tablet TAKE 1 TABLET BY MOUTH ONCE DAILY. 90 tablet 0  . nitrofurantoin, macrocrystal-monohydrate, (MACROBID) 100 MG capsule Take 100 mg by mouth at bedtime.  1  . potassium chloride (K-DUR) 10 MEQ tablet Take 10 mEq by mouth daily.    . propranolol (INDERAL) 10 MG tablet Take 2 tablets (20 mg total) by mouth daily. 60 tablet 11  . ULTICARE SHORT PEN NEEDLES 31G X 8 MM MISC USE AS DIRECTED THREE TIMES DAILY. 100 each 5  . VICTOZA 18 MG/3ML SOPN INJECT 1.8 MG S.Q. ONCE DAILY. 9 mL 2   No current facility-administered medications for this visit.      Physical Exam  Blood pressure (!) 144/58, pulse 82, height 5' 1"  (1.549 m), weight 233 lb (105.7 kg).  Constitutional: overall normal hygiene, normal nutrition, well developed, normal grooming, normal body habitus. Assistive device:none  Musculoskeletal: gait and station Limp none, muscle tone and strength are normal, no tremors or atrophy is present.  .  Neurological: coordination overall normal.  Deep tendon reflex/nerve stretch intact.  Sensation normal.  Cranial nerves II-XII intact.   Skin:   Normal overall no scars, lesions, ulcers or rashes. No psoriasis.  Psychiatric: Alert and oriented x 3.   Recent memory intact, remote memory unclear.  Normal mood and affect. Well groomed.  Good eye contact.  Cardiovascular: overall no swelling, no varicosities, no edema bilaterally, normal temperatures of the legs and arms, no clubbing, cyanosis and good capillary refill.  Lymphatic: palpation is normal.  Lower back is without pain.  Motion is very good.  She has normal reflexes.  Tone and strength are normal.  Gait is normal.  SLR is negative.  She has no spasm.  The patient has been educated about the nature of the problem(s) and counseled on treatment options.  The patient appeared to understand what I have discussed and is in agreement with it.  Encounter Diagnosis  Name  Primary?  . Acute right-sided low back pain with right-sided sciatica Yes    PLAN Call if any problems.  Precautions discussed.  Continue current medications.   Return to clinic prn   Electronically Signed Sanjuana Kava, MD 5/16/20182:08 PM

## 2017-01-28 ENCOUNTER — Other Ambulatory Visit (HOSPITAL_COMMUNITY): Payer: Self-pay | Admitting: *Deleted

## 2017-01-28 ENCOUNTER — Other Ambulatory Visit: Payer: Self-pay | Admitting: "Endocrinology

## 2017-02-02 ENCOUNTER — Other Ambulatory Visit (HOSPITAL_COMMUNITY): Payer: Medicare Other

## 2017-02-08 ENCOUNTER — Encounter (HOSPITAL_COMMUNITY): Payer: Medicare Other | Attending: Oncology

## 2017-02-08 DIAGNOSIS — D500 Iron deficiency anemia secondary to blood loss (chronic): Secondary | ICD-10-CM | POA: Diagnosis not present

## 2017-02-08 LAB — CBC WITH DIFFERENTIAL/PLATELET
Basophils Absolute: 0 10*3/uL (ref 0.0–0.1)
Basophils Relative: 0 %
EOS ABS: 0.1 10*3/uL (ref 0.0–0.7)
EOS PCT: 1 %
HCT: 35.6 % — ABNORMAL LOW (ref 36.0–46.0)
Hemoglobin: 11.2 g/dL — ABNORMAL LOW (ref 12.0–15.0)
Lymphocytes Relative: 17 %
Lymphs Abs: 1.1 10*3/uL (ref 0.7–4.0)
MCH: 28.2 pg (ref 26.0–34.0)
MCHC: 31.5 g/dL (ref 30.0–36.0)
MCV: 89.7 fL (ref 78.0–100.0)
Monocytes Absolute: 0.5 10*3/uL (ref 0.1–1.0)
Monocytes Relative: 7 %
Neutro Abs: 4.7 10*3/uL (ref 1.7–7.7)
Neutrophils Relative %: 75 %
PLATELETS: 81 10*3/uL — AB (ref 150–400)
RBC: 3.97 MIL/uL (ref 3.87–5.11)
RDW: 14.8 % (ref 11.5–15.5)
WBC: 6.3 10*3/uL (ref 4.0–10.5)

## 2017-02-08 LAB — COMPREHENSIVE METABOLIC PANEL
ALT: 31 U/L (ref 14–54)
AST: 42 U/L — AB (ref 15–41)
Albumin: 3.2 g/dL — ABNORMAL LOW (ref 3.5–5.0)
Alkaline Phosphatase: 97 U/L (ref 38–126)
Anion gap: 8 (ref 5–15)
BUN: 25 mg/dL — AB (ref 6–20)
CHLORIDE: 96 mmol/L — AB (ref 101–111)
CO2: 31 mmol/L (ref 22–32)
CREATININE: 1.06 mg/dL — AB (ref 0.44–1.00)
Calcium: 8.6 mg/dL — ABNORMAL LOW (ref 8.9–10.3)
GFR calc non Af Amer: 54 mL/min — ABNORMAL LOW (ref >60)
Glucose, Bld: 240 mg/dL — ABNORMAL HIGH (ref 65–99)
POTASSIUM: 4.1 mmol/L (ref 3.5–5.1)
SODIUM: 135 mmol/L (ref 135–145)
Total Bilirubin: 0.8 mg/dL (ref 0.3–1.2)
Total Protein: 6.6 g/dL (ref 6.5–8.1)

## 2017-02-08 LAB — IRON AND TIBC
Iron: 43 ug/dL (ref 28–170)
Saturation Ratios: 12 % (ref 10.4–31.8)
TIBC: 357 ug/dL (ref 250–450)
UIBC: 314 ug/dL

## 2017-02-08 LAB — VITAMIN B12: VITAMIN B 12: 421 pg/mL (ref 180–914)

## 2017-02-08 LAB — FERRITIN: FERRITIN: 27 ng/mL (ref 11–307)

## 2017-02-15 ENCOUNTER — Ambulatory Visit: Payer: Medicare Other | Admitting: Orthopedic Surgery

## 2017-02-16 ENCOUNTER — Ambulatory Visit: Payer: Medicare Other | Admitting: Orthopedic Surgery

## 2017-02-22 ENCOUNTER — Encounter: Payer: Self-pay | Admitting: Orthopedic Surgery

## 2017-02-22 ENCOUNTER — Ambulatory Visit (INDEPENDENT_AMBULATORY_CARE_PROVIDER_SITE_OTHER): Payer: Medicare Other | Admitting: Orthopedic Surgery

## 2017-02-22 DIAGNOSIS — M79641 Pain in right hand: Secondary | ICD-10-CM

## 2017-02-22 DIAGNOSIS — M6530 Trigger finger, unspecified finger: Secondary | ICD-10-CM | POA: Diagnosis not present

## 2017-02-22 DIAGNOSIS — G5601 Carpal tunnel syndrome, right upper limb: Secondary | ICD-10-CM

## 2017-02-22 NOTE — Progress Notes (Signed)
Patient ID: Penny Martinez, female   DOB: November 20, 1950, 66 y.o.   MRN: [id number]  POSTOP VISIT   Chief Complaint  Patient presents with  . Follow-up    left carpal tunnel release, left long trigger finger release, DOS 12/03/16    The patient is 3 months status post left long finger trigger release and a left carpal tunnel release she complains of tenderness over the left carpal tunnel incision. 90% of her symptoms have resolved she has occasional night pain  She's really complaining of pain over the medical carpal phalangeal joints volarly all 5 digits right hand with pain in the hand but without numbness or tingling. She does have a history of cervical disc disease surgery pending     Review of Systems  Skin: Negative.    Physical Exam  Constitutional: She is oriented to person, place, and time. She appears well-developed and well-nourished.  Neurological: She is alert and oriented to person, place, and time.  Psychiatric: She has a normal mood and affect.     Examination of the right hand reveals a prior carpal tunnel incision over the right hand back in 1996 she has tenderness there negative Phalen's negative Tinel's she has no sensory deficits in the right hand she has tenderness over the first through the fifth medical carpal phalangeal joints with some clicking of all digits, Full range of motion noted in all digits. No grip strength deficit. No wrist instability is noted.   We considered carpal tunnel release on the right after the left but her symptoms are atypical and she would benefit from nerve conduction study to confirm diagnosis  Encounter Diagnoses  Name Primary?  . Right hand pain Yes  . Carpal tunnel syndrome of right wrist   . Trigger finger of right hand, unspecified finger      11:14 AM Arther Abbott, MD 02/22/2017

## 2017-02-26 ENCOUNTER — Other Ambulatory Visit: Payer: Self-pay | Admitting: "Endocrinology

## 2017-03-18 ENCOUNTER — Encounter: Payer: Self-pay | Admitting: Gastroenterology

## 2017-03-23 ENCOUNTER — Other Ambulatory Visit: Payer: Self-pay | Admitting: "Endocrinology

## 2017-04-12 ENCOUNTER — Ambulatory Visit: Payer: Medicare Other | Admitting: "Endocrinology

## 2017-04-21 ENCOUNTER — Other Ambulatory Visit (HOSPITAL_COMMUNITY)
Admission: RE | Admit: 2017-04-21 | Discharge: 2017-04-21 | Disposition: A | Discharge status: Home / Self Care | Payer: Medicare Other | Source: Ambulatory Visit | Attending: "Endocrinology | Admitting: "Endocrinology

## 2017-04-21 ENCOUNTER — Telehealth (HOSPITAL_COMMUNITY): Payer: Self-pay | Admitting: Oncology

## 2017-04-21 DIAGNOSIS — E1122 Type 2 diabetes mellitus with diabetic chronic kidney disease: Secondary | ICD-10-CM | POA: Insufficient documentation

## 2017-04-21 DIAGNOSIS — N183 Chronic kidney disease, stage 3 (moderate): Secondary | ICD-10-CM | POA: Insufficient documentation

## 2017-04-21 LAB — COMPREHENSIVE METABOLIC PANEL
ALBUMIN: 3.4 g/dL — AB (ref 3.5–5.0)
ALK PHOS: 118 U/L (ref 38–126)
ALT: 24 U/L (ref 14–54)
ANION GAP: 10 (ref 5–15)
AST: 38 U/L (ref 15–41)
BUN: 25 mg/dL — AB (ref 6–20)
CO2: 26 mmol/L (ref 22–32)
Calcium: 9 mg/dL (ref 8.9–10.3)
Chloride: 92 mmol/L — ABNORMAL LOW (ref 101–111)
Creatinine, Ser: 1.05 mg/dL — ABNORMAL HIGH (ref 0.44–1.00)
GFR calc Af Amer: >60 mL/min (ref >60)
GFR calc non Af Amer: 55 mL/min — ABNORMAL LOW (ref >60)
GLUCOSE: 432 mg/dL — AB (ref 65–99)
POTASSIUM: 4.9 mmol/L (ref 3.5–5.1)
SODIUM: 128 mmol/L — AB (ref 135–145)
Total Bilirubin: 0.8 mg/dL (ref 0.3–1.2)
Total Protein: 6.8 g/dL (ref 6.5–8.1)

## 2017-04-21 LAB — HEMOGLOBIN A1C
Hgb A1c MFr Bld: 8.7 % — ABNORMAL HIGH (ref 4.8–5.6)
Mean Plasma Glucose: 202.99 mg/dL

## 2017-04-21 NOTE — Telephone Encounter (Signed)
Left vm advising pt of new appt time and dt 9/17 9:50

## 2017-04-27 ENCOUNTER — Other Ambulatory Visit (HOSPITAL_COMMUNITY)
Admission: RE | Admit: 2017-04-27 | Discharge: 2017-04-27 | Disposition: A | Discharge status: Home / Self Care | Payer: Medicare Other | Source: Ambulatory Visit | Attending: "Endocrinology | Admitting: "Endocrinology

## 2017-04-27 ENCOUNTER — Ambulatory Visit (INDEPENDENT_AMBULATORY_CARE_PROVIDER_SITE_OTHER): Payer: Medicare Other | Admitting: "Endocrinology

## 2017-04-27 ENCOUNTER — Encounter: Payer: Self-pay | Admitting: "Endocrinology

## 2017-04-27 VITALS — BP 149/65 | HR 67 | Ht 61.0 in | Wt 236.0 lb

## 2017-04-27 DIAGNOSIS — E871 Hypo-osmolality and hyponatremia: Secondary | ICD-10-CM | POA: Diagnosis present

## 2017-04-27 DIAGNOSIS — E1165 Type 2 diabetes mellitus with hyperglycemia: Secondary | ICD-10-CM

## 2017-04-27 DIAGNOSIS — E038 Other specified hypothyroidism: Secondary | ICD-10-CM | POA: Diagnosis not present

## 2017-04-27 DIAGNOSIS — Z6841 Body Mass Index (BMI) 40.0 and over, adult: Secondary | ICD-10-CM | POA: Diagnosis not present

## 2017-04-27 DIAGNOSIS — IMO0002 Reserved for concepts with insufficient information to code with codable children: Secondary | ICD-10-CM | POA: Insufficient documentation

## 2017-04-27 DIAGNOSIS — E782 Mixed hyperlipidemia: Secondary | ICD-10-CM | POA: Diagnosis not present

## 2017-04-27 DIAGNOSIS — E118 Type 2 diabetes mellitus with unspecified complications: Secondary | ICD-10-CM | POA: Diagnosis not present

## 2017-04-27 LAB — RENAL FUNCTION PANEL
ALBUMIN: 3.6 g/dL (ref 3.5–5.0)
Anion gap: 9 (ref 5–15)
BUN: 26 mg/dL — AB (ref 6–20)
CALCIUM: 9.2 mg/dL (ref 8.9–10.3)
CO2: 30 mmol/L (ref 22–32)
CREATININE: 1.13 mg/dL — AB (ref 0.44–1.00)
Chloride: 94 mmol/L — ABNORMAL LOW (ref 101–111)
GFR calc Af Amer: 58 mL/min — ABNORMAL LOW (ref >60)
GFR calc non Af Amer: 50 mL/min — ABNORMAL LOW (ref >60)
GLUCOSE: 93 mg/dL (ref 65–99)
Phosphorus: 3.3 mg/dL (ref 2.5–4.6)
Potassium: 4.1 mmol/L (ref 3.5–5.1)
SODIUM: 133 mmol/L — AB (ref 135–145)

## 2017-04-27 MED ORDER — FREESTYLE LIBRE READER DEVI: 1.0000 | Freq: Once | 0 refills | Status: AC

## 2017-04-27 MED ORDER — FREESTYLE LIBRE SENSOR SYSTEM MISC: 2 refills | Status: DC

## 2017-04-27 NOTE — Patient Instructions (Signed)
Advice for Weight Management -For most of [place] the best way to lose weight is by diet management. Generally speaking, diet management means restricting carbohydrate consumption to minimum possible (and to unprocessed or minimally processed complex starch) and increasing protein intake (animal or plant source), fruits, and vegetables.  -Sticking to a routine mealtime to eat 3 meals a day and avoiding unnecessary snacks is shown to have a big role in weight control.  -It is better to avoid simple carbohydrates including: Cakes, Sweet Desserts, Ice Cream, Soda (diet and regular), Sweet Tea, Candies, Chips, Cookies, Store Bought Juices, Alcohol in Excess of  1-2 drinks a day, Artificial Sweeteners, and "Sugar-free" Products.   -Exercise: 30 -60 minutes a day 3-4 days a week, or 150 minutes a week. Combine stretch, strength, and aerobic activities. You may seek evaluation by your heart doctor prior to initiating exercise if you have high risk for heart disease.  -If you are interested, we can schedule a visit with Jearld Fenton, RDN, CDE for individualized nutrition education.

## 2017-04-27 NOTE — Progress Notes (Signed)
Subjective:    Patient ID: Penny Martinez, female    DOB: [birth year],    Past Medical History:  Diagnosis Date  . Acid reflux   . Anemia   . Arthritis   . C. difficile colitis June/July 2015  . Cancer ([place])    skin cancer  . CHF (congestive heart failure) ([place])   . 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 Negative viral markers 2015  . Complication of anesthesia   . COPD (chronic obstructive pulmonary disease) ([place])   . Diabetes mellitus without complication ([place])   . GERD (gastroesophageal reflux disease)   . History of kidney stones   . Hypercholesteremia   . Hypertension   . Hypothyroidism   . IBS (irritable bowel syndrome)   . Kidney disease   . Liver disease   . Neuropathy   . PONV (postoperative nausea and vomiting)   . Sleep apnea   . Thyroid disease    Past Surgical History:  Procedure Laterality Date  . ABDOMINAL HYSTERECTOMY    . APPENDECTOMY    . CAROTID ARTERY - SUBCLAVIAN ARTERY BYPASS GRAFT Right   . CARPAL TUNNEL RELEASE Right   . CARPAL TUNNEL RELEASE Left 12/03/2016   Procedure: CARPAL TUNNEL RELEASE;  Surgeon: Carole Civil, MD;  Location: [facility];  Service: Orthopedics;  Laterality: Left;  . CATARACT EXTRACTION Bilateral   . CHOLECYSTECTOMY    . COLONOSCOPY N/A 02/19/2014   Dr. Gala Romney: rectal varices, rectal polyp overlying a varix non-manipulated  . ESOPHAGOGASTRODUODENOSCOPY N/A 02/19/2014   Dr. Gala Romney: esophageal varices, multiple gastric polyps with largest biopsied and hyperplastic. Negative H.pylori  . GIVENS CAPSULE STUDY N/A 05/04/2014   multiple polypoid-like lesions throughout small bowel, scattered superficial non-bleeding erosions more distal bowel, referred to [facility] for enteroscopy. Capsule study not complete, poor images.   Marland Kitchen TRIGGER FINGER RELEASE Left 12/03/2016   Procedure: RELEASE TRIGGER FINGER/A-1 PULLEY LEFT LONG TRIGGER FINGER RELEASE;  Surgeon: Carole Civil, MD;  Location: [facility];  Service: Orthopedics;  Laterality:  Left;  . urosepsis     Social History   Social History  . Marital status: Married    Spouse name: N/A  . Number of children: N/A  . Years of education: N/A   Social History Main Topics  . Smoking status: Former Smoker    Packs/day: 2.00    Years: 35.00    Quit date: 05/05/2003  . Smokeless tobacco: Former Systems developer     Comment: Quit smoking 11 years ago  . Alcohol use No  . Drug use: No  . Sexual activity: Not Currently    Birth control/ protection: Surgical   Other Topics Concern  . None   Social History Narrative  . None   Outpatient Encounter Prescriptions as of 04/27/2017  Medication Sig  . amitriptyline (ELAVIL) 25 MG tablet Take 25 mg by mouth at bedtime.  Marland Kitchen aspirin 81 MG tablet Take 81 mg by mouth daily.  . baclofen (LIORESAL) 10 MG tablet Take 10 mg by mouth 3 (three) times daily.  . benazepril (LOTENSIN) 20 MG tablet Take 20 mg by mouth daily.  . Continuous Blood Gluc Receiver (FREESTYLE LIBRE READER) DEVI 1 Piece by Does not apply route once.  . Continuous Blood Gluc Sensor (FREESTYLE LIBRE SENSOR SYSTEM) MISC Use one sensor every 10 days.  Marland Kitchen CRANBERRY-VITAMIN C PO Take 2 capsules by mouth at bedtime. Dosage is the 25,200 mg cranberry/vitamin C supplement  . diphenhydrAMINE (SOMINEX) 25 MG tablet Take  25 mg by mouth at bedtime. Reported on 01/31/2016  . diphenoxylate-atropine (LOMOTIL) 2.5-0.025 MG tablet 1-2 tablets every 4-6 hours as needed for diarrhea.  . ezetimibe (ZETIA) 10 MG tablet Take 10 mg by mouth daily.  . furosemide (LASIX) 80 MG tablet Take 80 mg by mouth daily.  Marland Kitchen gabapentin (NEURONTIN) 100 MG capsule TAKE (1) CAPSULE BY MOUTH THREE TIMES DAILY  . HUMULIN R U-500 KWIKPEN 500 UNIT/ML kwikpen INJECT 130 UNITS S.Q. THREE TIMES DAILY WITH MEALS.  . HYDROcodone-acetaminophen (NORCO/VICODIN) 5-325 MG tablet Take 1 tablet by mouth every 8 (eight) hours as needed for moderate pain.  Marland Kitchen lansoprazole (PREVACID) 30 MG capsule Take 30 mg by mouth daily.  Marland Kitchen  levothyroxine (SYNTHROID, LEVOTHROID) 100 MCG tablet TAKE 1 TABLET BY MOUTH ONCE DAILY.  . nitrofurantoin, macrocrystal-monohydrate, (MACROBID) 100 MG capsule Take 100 mg by mouth at bedtime.  . potassium chloride (K-DUR) 10 MEQ tablet Take 10 mEq by mouth daily.  . propranolol (INDERAL) 10 MG tablet Take 2 tablets (20 mg total) by mouth daily.  Marland Kitchen ULTICARE SHORT PEN NEEDLES 31G X 8 MM MISC USE AS DIRECTED THREE TIMES DAILY.  Marland Kitchen VICTOZA 18 MG/3ML SOPN INJECT 1.8 MG S.Q. ONCE DAILY.   No facility-administered encounter medications on file as of 04/27/2017.    ALLERGIES: Allergies  Allergen Reactions  . Erythromycin Hives  . Niacin Rash and Shortness Of Breath  . Penicillins Shortness Of Breath  . Sulfa Antibiotics Hives  . Statins     Other reaction(s): Muscle Pain Other reaction(s): Muscle Pain  . Codeine Itching   VACCINATION STATUS: Immunization History  Administered Date(s) Administered  . Influenza,inj,Quad PF,6+ Mos 07/24/2016    Diabetes  She presents for her follow-up diabetic visit. She has type 2 diabetes mellitus. Onset time: She was diagnosed at approximate age of 23 years. Her disease course has been worsening. There are no hypoglycemic associated symptoms. Pertinent negatives for hypoglycemia include no confusion, headaches, pallor or seizures. Associated symptoms include fatigue, polydipsia and polyuria. Pertinent negatives for diabetes include no chest pain and no polyphagia. There are no hypoglycemic complications. Symptoms are worsening. Diabetic complications include heart disease, nephropathy and PVD. Risk factors for coronary artery disease include diabetes mellitus, dyslipidemia, obesity, hypertension, sedentary lifestyle and tobacco exposure. Current diabetic treatment includes intensive insulin program. She is compliant with treatment most of the time. Her weight is increasing steadily. She is following a generally unhealthy diet. Prior visit with dietitian: She  declined referral to a dietitian. She never participates in exercise. Her home blood glucose trend is increasing steadily. Her breakfast blood glucose range is generally 180-200 mg/dl. Her lunch blood glucose range is generally 180-200 mg/dl. Her dinner blood glucose range is generally 180-200 mg/dl. Her bedtime blood glucose range is generally 180-200 mg/dl. Her overall blood glucose range is 180-200 mg/dl. An ACE inhibitor/angiotensin II receptor blocker is being taken. Eye exam is current.  Hypertension  This is a chronic problem. The current episode started more than 1 year ago. The problem is controlled. Pertinent negatives include no chest pain, headaches, palpitations or shortness of breath. Risk factors for coronary artery disease include diabetes mellitus, dyslipidemia, obesity and sedentary lifestyle. Hypertensive end-organ damage includes PVD.  Hyperlipidemia  This is a chronic problem. The current episode started more than 1 year ago. Pertinent negatives include no chest pain, myalgias or shortness of breath. Risk factors for coronary artery disease include diabetes mellitus, dyslipidemia, hypertension, a sedentary lifestyle and obesity.    Review of Systems  Constitutional: Positive for fatigue. Negative for unexpected weight change.  HENT: Negative for trouble swallowing and voice change.   Eyes: Negative for visual disturbance.  Respiratory: Negative for cough, shortness of breath and wheezing.   Cardiovascular: Negative for chest pain, palpitations and leg swelling.  Gastrointestinal: Negative for diarrhea, nausea and vomiting.  Endocrine: Positive for polydipsia and polyuria. Negative for cold intolerance, heat intolerance and polyphagia.  Musculoskeletal: Negative for arthralgias and myalgias.  Skin: Negative for color change, pallor, rash and wound.  Neurological: Negative for seizures and headaches.  Psychiatric/Behavioral: Negative for confusion and suicidal ideas.     Objective:    BP (!) 149/65   Pulse 67   Ht 5' 1"  (1.549 m)   Wt 236 lb (107 kg)   BMI 44.59 kg/m   Wt Readings from Last 3 Encounters:  04/27/17 236 lb (107 kg)  01/20/17 233 lb (105.7 kg)  01/08/17 230 lb (104.3 kg)    Physical Exam  Constitutional: She is oriented to person, place, and time. She appears well-developed.  HENT:  Head: Normocephalic and atraumatic.  Eyes: EOM are normal.  Neck: Normal range of motion. Neck supple. No tracheal deviation present. No thyromegaly present.  Cardiovascular: Normal rate and regular rhythm.   Pulmonary/Chest: Effort normal and breath sounds normal.  Abdominal: Soft. Bowel sounds are normal. There is no tenderness. There is no guarding.  Musculoskeletal: Normal range of motion. She exhibits no edema.  Neurological: She is alert and oriented to person, place, and time. She has normal reflexes. No cranial nerve deficit. Coordination normal.  Skin: Skin is warm and dry. No rash noted. No erythema. No pallor.  Psychiatric: She has a normal mood and affect. Judgment normal.    Results for orders placed or performed during the hospital encounter of 04/21/17  Comprehensive metabolic panel  Result Value Ref Range   Sodium 128 (L) 135 - 145 mmol/L   Potassium 4.9 3.5 - 5.1 mmol/L   Chloride 92 (L) 101 - 111 mmol/L   CO2 26 22 - 32 mmol/L   Glucose, Bld 432 (H) 65 - 99 mg/dL   BUN 25 (H) 6 - 20 mg/dL   Creatinine, Ser 1.05 (H) 0.44 - 1.00 mg/dL   Calcium 9.0 8.9 - 10.3 mg/dL   Total Protein 6.8 6.5 - 8.1 g/dL   Albumin 3.4 (L) 3.5 - 5.0 g/dL   AST 38 15 - 41 U/L   ALT 24 14 - 54 U/L   Alkaline Phosphatase 118 38 - 126 U/L   Total Bilirubin 0.8 0.3 - 1.2 mg/dL   GFR calc non Af Amer 55 (L) >60 mL/min   GFR calc Af Amer >60 >60 mL/min   Anion gap 10 5 - 15  Hemoglobin A1c  Result Value Ref Range   Hgb A1c MFr Bld 8.7 (H) 4.8 - 5.6 %   Mean Plasma Glucose 202.99 mg/dL   Complete Blood Count (Most recent): Lab Results  Component  Value Date   WBC 6.3 02/08/2017   HGB 11.2 (L) 02/08/2017   HCT 35.6 (L) 02/08/2017   MCV 89.7 02/08/2017   PLT 81 (L) 02/08/2017    Diabetic Labs (most recent): Lab Results  Component Value Date   HGBA1C 8.7 (H) 04/21/2017   HGBA1C 6.4 (H) 12/31/2016   HGBA1C 7.5 (H) 09/30/2016     Assessment & Plan:   1. Uncontrolled type 2 diabetes mellitus with complication, with long-term current use of insulin ([place])  Her diabetes is  complicated by  chronic kidney disease and peripheral arterial disease as well as CHF.  patient remains at a high risk for more acute and chronic complications of diabetes which include CAD, CVA, CKD, retinopathy, and neuropathy. These are all discussed in detail with the patient.  Patient came with her  Logs Showing significantly above target readings, her recent A1c is higher at 8.7% from 6.4% last visit.    Glucose logs and insulin administration records pertaining to this visit,  to be scanned into patient's records.  Recent labs reviewed.   - I have re-counseled the patient on diet management and weight loss  by adopting a carbohydrate restricted / protein rich  Diet.  Suggestion is made for her to avoid simple carbohydrates  from her diet including Cakes, Sweet Desserts, Ice Cream, Soda (diet and regular), Sweet Tea, Candies, Chips, Cookies, Store Bought Juices, Alcohol in Excess of  1-2 drinks a day, Artificial Sweeteners, and "Sugar-free" Products. This will help patient to have stable blood glucose profile and potentially avoid unintended weight gain.   - Patient is advised to stick to a routine mealtimes to eat 3 meals  a day and avoid unnecessary snacks ( to snack only to correct hypoglycemia).   - I have approached patient with the following individualized plan to manage diabetes and patient agrees.  - She will continue to need U500 insulin. - Her recent glycemic profile is significantly above target, I have advised her to continue  Humulin  U500  at 130 units with breakfast, 130 units with lunch, and 130 units with supper for pre-meal glucose profile above 90 mg/dL associated with  strict monitoring her blood glucose 4 times a day-before meals and at bedtime.   -She will continue Victoza 1.8 mg.  -Patient is encouraged to call clinic for blood glucose levels less than 70 or above 300 mg /dl.  -Advised to avoid unnecessary evening snacks.  -she is not interested  to explore option of bariatric surgery. -She declined referral to the dietitian.  -Patient is not a candidate for metformin andSGLT2 inhibitors due to CKD. -   She is offered low dose dex supression test to screen endogenous steroid burden, waiting for results. 2015 abdominal CT, not dedicated to adrenals, showed normal adrenals anatomy. - Her recent labs show hyponatremia of 128 on 04/21/2017. She is not specifically symptomatic today. I will send her for stat CMP. She'll be called if sodium is still below 130 mg/dL.   - Patient specific target  for A1c; LDL, HDL, Triglycerides, and  Waist Circumference were discussed in detail.  2) BP/HTN:  uncontrolled. Continue current medications including ACEI/ARB. 3) Lipids/HPL:  continue statins. 4)  Weight/Diet:  she decided not to consult with a dietitian, exercise, and carbohydrates information provided.  4) Primary hypothyroidism Her thyroid function tests are consistent with appropriate replacement. I will  continue levothyroxine 130mg po qam.   - We discussed about correct intake of levothyroxine, at fasting, with water, separated by at least 30 minutes from breakfast, and separated by more than 4 hours from calcium, iron, multivitamins, acid reflux medications (PPIs). -Patient is made aware of the fact that thyroid hormone replacement is needed for life, dose to be adjusted by periodic monitoring of thyroid function tests.  6) Chronic Care/Health Maintenance:  -Patient is  on ACEI/ARB and Statin medications and encouraged  to continue to follow up with Ophthalmology, Podiatrist at least yearly or according to recommendations, and advised to  stay away from smoking. I have recommended yearly flu  vaccine and pneumonia vaccination at least every 5 years; and  sleep for at least 7 hours a day. - She cannot exercise at optimum level. I advised patient to maintain close follow up with her PCP for primary care needs.  Patient is asked to bring meter and  blood glucose logs during her next visit.  - Time spent with the patient: 25 min, of which >50% was spent in reviewing her sugar logs , discussing her hypo- and hyper-glycemic episodes, reviewing  previous labs and insulin doses and developing a plan to avoid hypo- and hyper-glycemia.   Follow up plan: Return in about 3 months (around 07/28/2017) for labs today, follow up with pre-visit labs.  Glade Lloyd, MD Phone: [phone]  Fax: [phone]  This note was partially dictated with voice recognition software. Similar sounding words can be transcribed inadequately or may not  be corrected upon review.  04/27/2017, 3:51 PM

## 2017-05-05 ENCOUNTER — Other Ambulatory Visit (HOSPITAL_COMMUNITY): Payer: Medicare Other

## 2017-05-05 ENCOUNTER — Ambulatory Visit (HOSPITAL_COMMUNITY): Payer: Medicare Other

## 2017-05-11 ENCOUNTER — Other Ambulatory Visit: Payer: Self-pay | Admitting: "Endocrinology

## 2017-05-24 ENCOUNTER — Encounter (HOSPITAL_COMMUNITY): Payer: Self-pay

## 2017-05-24 ENCOUNTER — Encounter (HOSPITAL_COMMUNITY): Payer: Medicare Other

## 2017-05-24 ENCOUNTER — Encounter (HOSPITAL_COMMUNITY): Payer: Medicare Other | Attending: Oncology | Admitting: Oncology

## 2017-05-24 VITALS — BP 150/45 | HR 79 | Temp 98.4°F | Resp 18 | Wt 239.1 lb

## 2017-05-24 DIAGNOSIS — Z794 Long term (current) use of insulin: Secondary | ICD-10-CM | POA: Diagnosis not present

## 2017-05-24 DIAGNOSIS — Z79899 Other long term (current) drug therapy: Secondary | ICD-10-CM | POA: Insufficient documentation

## 2017-05-24 DIAGNOSIS — Z85828 Personal history of other malignant neoplasm of skin: Secondary | ICD-10-CM | POA: Insufficient documentation

## 2017-05-24 DIAGNOSIS — E7800 Pure hypercholesterolemia, unspecified: Secondary | ICD-10-CM | POA: Insufficient documentation

## 2017-05-24 DIAGNOSIS — J449 Chronic obstructive pulmonary disease, unspecified: Secondary | ICD-10-CM | POA: Diagnosis not present

## 2017-05-24 DIAGNOSIS — K219 Gastro-esophageal reflux disease without esophagitis: Secondary | ICD-10-CM | POA: Insufficient documentation

## 2017-05-24 DIAGNOSIS — Z880 Allergy status to penicillin: Secondary | ICD-10-CM | POA: Diagnosis not present

## 2017-05-24 DIAGNOSIS — I509 Heart failure, unspecified: Secondary | ICD-10-CM | POA: Insufficient documentation

## 2017-05-24 DIAGNOSIS — Z87891 Personal history of nicotine dependence: Secondary | ICD-10-CM | POA: Insufficient documentation

## 2017-05-24 DIAGNOSIS — D696 Thrombocytopenia, unspecified: Secondary | ICD-10-CM | POA: Diagnosis not present

## 2017-05-24 DIAGNOSIS — R161 Splenomegaly, not elsewhere classified: Secondary | ICD-10-CM | POA: Diagnosis not present

## 2017-05-24 DIAGNOSIS — E039 Hypothyroidism, unspecified: Secondary | ICD-10-CM | POA: Diagnosis not present

## 2017-05-24 DIAGNOSIS — D500 Iron deficiency anemia secondary to blood loss (chronic): Secondary | ICD-10-CM | POA: Insufficient documentation

## 2017-05-24 DIAGNOSIS — Z885 Allergy status to narcotic agent status: Secondary | ICD-10-CM | POA: Insufficient documentation

## 2017-05-24 DIAGNOSIS — G4730 Sleep apnea, unspecified: Secondary | ICD-10-CM | POA: Insufficient documentation

## 2017-05-24 DIAGNOSIS — I110 Hypertensive heart disease with heart failure: Secondary | ICD-10-CM | POA: Diagnosis not present

## 2017-05-24 DIAGNOSIS — E119 Type 2 diabetes mellitus without complications: Secondary | ICD-10-CM | POA: Diagnosis not present

## 2017-05-24 DIAGNOSIS — K7460 Unspecified cirrhosis of liver: Secondary | ICD-10-CM | POA: Insufficient documentation

## 2017-05-24 DIAGNOSIS — Z7982 Long term (current) use of aspirin: Secondary | ICD-10-CM | POA: Insufficient documentation

## 2017-05-24 LAB — COMPREHENSIVE METABOLIC PANEL
ALK PHOS: 134 U/L — AB (ref 38–126)
ALT: 26 U/L (ref 14–54)
AST: 40 U/L (ref 15–41)
Albumin: 3.5 g/dL (ref 3.5–5.0)
Anion gap: 10 (ref 5–15)
BUN: 20 mg/dL (ref 6–20)
CALCIUM: 9.6 mg/dL (ref 8.9–10.3)
CHLORIDE: 96 mmol/L — AB (ref 101–111)
CO2: 29 mmol/L (ref 22–32)
CREATININE: 1.03 mg/dL — AB (ref 0.44–1.00)
GFR calc non Af Amer: 56 mL/min — ABNORMAL LOW (ref >60)
GLUCOSE: 217 mg/dL — AB (ref 65–99)
Potassium: 4.3 mmol/L (ref 3.5–5.1)
SODIUM: 135 mmol/L (ref 135–145)
Total Bilirubin: 0.8 mg/dL (ref 0.3–1.2)
Total Protein: 7.2 g/dL (ref 6.5–8.1)

## 2017-05-24 LAB — CBC WITH DIFFERENTIAL/PLATELET
Basophils Absolute: 0 10*3/uL (ref 0.0–0.1)
Basophils Relative: 0 %
EOS ABS: 0.1 10*3/uL (ref 0.0–0.7)
EOS PCT: 2 %
HCT: 31.9 % — ABNORMAL LOW (ref 36.0–46.0)
HEMOGLOBIN: 10.1 g/dL — AB (ref 12.0–15.0)
LYMPHS ABS: 0.8 10*3/uL (ref 0.7–4.0)
LYMPHS PCT: 19 %
MCH: 26.4 pg (ref 26.0–34.0)
MCHC: 31.7 g/dL (ref 30.0–36.0)
MCV: 83.3 fL (ref 78.0–100.0)
Monocytes Absolute: 0.4 10*3/uL (ref 0.1–1.0)
Monocytes Relative: 9 %
Neutro Abs: 3.1 10*3/uL (ref 1.7–7.7)
Neutrophils Relative %: 70 %
PLATELETS: 73 10*3/uL — AB (ref 150–400)
RBC: 3.83 MIL/uL — AB (ref 3.87–5.11)
RDW: 17.3 % — ABNORMAL HIGH (ref 11.5–15.5)
WBC: 4.4 10*3/uL (ref 4.0–10.5)

## 2017-05-24 LAB — IRON AND TIBC
Iron: 95 ug/dL (ref 28–170)
Saturation Ratios: 21 % (ref 10.4–31.8)
TIBC: 451 ug/dL — ABNORMAL HIGH (ref 250–450)
UIBC: 356 ug/dL

## 2017-05-24 LAB — VITAMIN B12: VITAMIN B 12: 623 pg/mL (ref 180–914)

## 2017-05-24 LAB — FERRITIN: FERRITIN: 9 ng/mL — AB (ref 11–307)

## 2017-05-24 NOTE — Patient Instructions (Signed)
[facility] at [facility] Discharge Instructions  RECOMMENDATIONS MADE BY THE CONSULTANT AND ANY TEST RESULTS WILL BE SENT TO YOUR REFERRING PHYSICIAN.  You saw Dr. Zhou today.  Thank you for choosing [facility] at [facility] to provide your oncology and hematology care.  To afford each patient quality time with our provider, please arrive at least 15 minutes before your scheduled appointment time.    If you have a lab appointment with the Cancer Center please come in thru the  Main Entrance and check in at the main information desk  You need to re-schedule your appointment should you arrive 10 or more minutes late.  We strive to give you quality time with our providers, and arriving late affects you and other patients whose appointments are after yours.  Also, if you no show three or more times for appointments you may be dismissed from the clinic at the providers discretion.     Again, thank you for choosing [facility].  Our hope is that these requests will decrease the amount of time that you wait before being seen by our physicians.       _____________________________________________________________  Should you have questions after your visit to [facility], please contact our office at [phone] between the hours of 8:30 a.m. and 4:30 p.m.  Voicemails left after 4:30 p.m. will not be returned until the following business day.  For prescription refill requests, have your pharmacy contact our office.       Resources For Cancer Patients and their Caregivers ? American Cancer Society: Can assist with transportation, wigs, general needs, runs Look Good Feel Better.        1-888-227-6333 ? Cancer Care: Provides financial assistance, online support groups, medication/co-pay assistance.  1-800-813-HOPE (4673) ? [facility] Assists [place] cancer patients and their families through  emotional , educational and financial support.  336-427-4357 ? [place] DSS Where to apply for food stamps, Medicaid and utility assistance. 336-342-1394 ? [facility]: Transportation to medical appointments. 336-347-2287 ? Social Security Administration: May apply for disability if have a Stage IV cancer. 336-342-7796 1-800-772-1213 ? [place] Aging, Disability and Transit Services: Assists with nutrition, care and transit needs. 336-349-2343  Cancer Center Support Programs: @10RELATIVEDAYS@ > Cancer Support Group  2nd Tuesday of the month 1pm-2pm, Journey Room  > Creative Journey  3rd Tuesday of the month 1130am-1pm, Journey Room  > Look Good Feel Better  1st Wednesday of the month 10am-12 noon, Journey Room (Call American Cancer Society to register 1-800-395-5775)    

## 2017-05-24 NOTE — Progress Notes (Signed)
[place]  Progress Note  Patient Care Team: Vesta Mixer as PCP - General (Physician Assistant) Daneil Dolin, MD as Consulting Physician (Gastroenterology)  CHIEF COMPLAINTS/PURPOSE OF CONSULTATION:  Iron deficiency anemia secondary to chronic GI related blood loss Rectal varices found on colonoscopy 2015 Cirrhosis secondary to NASH Esophageal varices found on EGD 2015 Hospitalized in 2015 secondary to symptomatic anemia requiring PRBC transfusion Capsule study with multiple polypoid like lesions throughout small bowel, scattered superficial non-bleeding erosions   HISTORY OF PRESENTING ILLNESS:  Penny Martinez 66 y.o. female is here because for f/u of anemia and iron deficiency. She has received 3 doses IV iron in the past and tolerated well.   Patient presents today for continued follow up. Unfortunately the lab is behind today and patient was not able to get her labs done prior to her visit. She states she recently had UTI but took antibiotics with improvement in her symptoms. She also complains of having abdominal distention especially across her upper abdomen. She also has chronic edema in her bilateral feet. Complains of fatigue. Denies any pain symptoms. Appetite is at 100%. She denies any chest pain, shortness breath, abdominal pain. She denies any melena, hematochezia, hematuria.  MEDICAL HISTORY:  Past Medical History:  Diagnosis Date  . Acid reflux   . Anemia   . Arthritis   . C. difficile colitis June/July 2015  . Cancer ([place])    skin cancer  . CHF (congestive heart failure) ([place])   . Cirrhosis ([place])    likely due to NASH. Negative viral markers 2015  . Complication of anesthesia   . COPD (chronic obstructive pulmonary disease) ([place])   . Diabetes mellitus without complication ([place])   . GERD (gastroesophageal reflux disease)   . History of kidney stones   . Hypercholesteremia   . Hypertension   . Hypothyroidism   . IBS (irritable  bowel syndrome)   . Kidney disease   . Liver disease   . Neuropathy   . PONV (postoperative nausea and vomiting)   . Sleep apnea   . Thyroid disease     SURGICAL HISTORY: Past Surgical History:  Procedure Laterality Date  . ABDOMINAL HYSTERECTOMY    . APPENDECTOMY    . CAROTID ARTERY - SUBCLAVIAN ARTERY BYPASS GRAFT Right   . CARPAL TUNNEL RELEASE Right   . CARPAL TUNNEL RELEASE Left 12/03/2016   Procedure: CARPAL TUNNEL RELEASE;  Surgeon: Carole Civil, MD;  Location: [facility];  Service: Orthopedics;  Laterality: Left;  . CATARACT EXTRACTION Bilateral   . CHOLECYSTECTOMY    . COLONOSCOPY N/A 02/19/2014   Dr. Gala Romney: rectal varices, rectal polyp overlying a varix non-manipulated  . ESOPHAGOGASTRODUODENOSCOPY N/A 02/19/2014   Dr. Gala Romney: esophageal varices, multiple gastric polyps with largest biopsied and hyperplastic. Negative H.pylori  . GIVENS CAPSULE STUDY N/A 05/04/2014   multiple polypoid-like lesions throughout small bowel, scattered superficial non-bleeding erosions more distal bowel, referred to [facility] for enteroscopy. Capsule study not complete, poor images.   Marland Kitchen TRIGGER FINGER RELEASE Left 12/03/2016   Procedure: RELEASE TRIGGER FINGER/A-1 PULLEY LEFT LONG TRIGGER FINGER RELEASE;  Surgeon: Carole Civil, MD;  Location: [facility];  Service: Orthopedics;  Laterality: Left;  . urosepsis      SOCIAL HISTORY: Social History   Social History  . Marital status: Married    Spouse name: N/A  . Number of children: N/A  . Years of education: N/A   Occupational History  . Not on file.  Social History Main Topics  . Smoking status: Former Smoker    Packs/day: 2.00    Years: 35.00    Quit date: 05/05/2003  . Smokeless tobacco: Former Systems developer     Comment: Quit smoking 11 years ago  . Alcohol use No  . Drug use: No  . Sexual activity: Not Currently    Birth control/ protection: Surgical   Other Topics Concern  . Not on file   Social History Narrative  . 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 ETOH, none She used to crossstitch but no longer can with her hands.  FAMILY HISTORY: Family History  Problem Relation Age of Onset  . Diabetes Mother   . Hypertension Mother   . Kidney failure Mother   . Blindness Mother   . Aneurysm Father   . Colon cancer Neg Hx    Mother was diabetic and ended up on dialysis, she died at 85 years-old Father died of an aneurysm at 67 years-old 13 brother and 1 sister Brother is borderline diabetic with heart issues Sister is fairly healthy  ALLERGIES:  is allergic to erythromycin; niacin; penicillins; sulfa antibiotics; statins; and codeine.  MEDICATIONS:  Current Outpatient Prescriptions  Medication Sig Dispense Refill  . amitriptyline (ELAVIL) 25 MG tablet Take 25 mg by mouth at bedtime.    Marland Kitchen aspirin 81 MG tablet Take 81 mg by mouth daily.    . baclofen (LIORESAL) 10 MG tablet Take 10 mg by mouth 3 (three) times daily.    . benazepril (LOTENSIN) 20 MG tablet Take 20 mg by mouth daily.    . Continuous Blood Gluc Sensor (FREESTYLE LIBRE SENSOR SYSTEM) MISC Use one sensor every 10 days. 3 each 2  . CRANBERRY-VITAMIN C PO Take 2 capsules by mouth at bedtime. Dosage is the 25,200 mg cranberry/vitamin C supplement    . diphenhydrAMINE (SOMINEX) 25 MG tablet Take 25 mg by mouth at bedtime. Reported on 01/31/2016    . diphenoxylate-atropine (LOMOTIL) 2.5-0.025 MG tablet 1-2 tablets every 4-6 hours as needed for diarrhea. 120 tablet 3  . ezetimibe (ZETIA) 10 MG tablet Take 10 mg by mouth daily.    . furosemide (LASIX) 80 MG tablet Take 80 mg by mouth daily.    Marland Kitchen gabapentin (NEURONTIN) 100 MG capsule TAKE (1) CAPSULE BY MOUTH THREE TIMES DAILY 90 capsule 2  . HUMULIN R U-500 KWIKPEN 500 UNIT/ML kwikpen INJECT 130 UNITS S.Q. THREE TIMES DAILY WITH MEALS. 18 mL 0  . HYDROcodone-acetaminophen (NORCO/VICODIN)  5-325 MG tablet Take 1 tablet by mouth every 8 (eight) hours as needed for moderate pain. 15 tablet 0  . lansoprazole (PREVACID) 30 MG capsule Take 30 mg by mouth daily.    Marland Kitchen levothyroxine (SYNTHROID, LEVOTHROID) 100 MCG tablet TAKE 1 TABLET BY MOUTH ONCE DAILY. 90 tablet 0  . nitrofurantoin, macrocrystal-monohydrate, (MACROBID) 100 MG capsule Take 100 mg by mouth at bedtime.  1  . potassium chloride (K-DUR) 10 MEQ tablet Take 10 mEq by mouth daily.    . propranolol (INDERAL) 10 MG tablet Take 2 tablets (20 mg total) by mouth daily. 60 tablet 11  . ULTICARE SHORT PEN NEEDLES 31G X 8 MM MISC USE AS DIRECTED THREE TIMES DAILY. 100 each 5  . VICTOZA 18 MG/3ML SOPN INJECT 1.8 MG S.Q. ONCE DAILY. 9 mL 2   No current facility-administered medications for this visit.  Review of Systems  Constitutional: Positive for malaise/fatigue.       No loss of appetite.   HENT: Negative.   Eyes: Negative.   Respiratory: Negative.  Negative for shortness of breath.   Cardiovascular: Negative.  Negative for chest pain.  Gastrointestinal: Negative.  Negative for abdominal pain and melena.  Genitourinary: Negative.   Skin: Negative.   Neurological: Negative.   Endo/Heme/Allergies: Negative.  Does not bruise/bleed easily.  Psychiatric/Behavioral: Negative.   All other systems reviewed and are negative. 14 point ROS was done and is otherwise as detailed above or in HPI  PHYSICAL EXAMINATION: ECOG PERFORMANCE STATUS: 1 - Symptomatic but completely ambulatory  Vitals:   05/24/17 1030  BP: (!) 150/45  Pulse: 79  Resp: 18  Temp: 98.4 F (36.9 C)  SpO2: 97%   Filed Weights   05/24/17 1030  Weight: 239 lb 1.6 oz (108.5 kg)   Physical Exam  Constitutional: She is oriented to person, place, and time and well-developed, well-nourished, and in no distress.  Obese. Wears glasses.  HENT:  Head: Normocephalic and atraumatic.  Nose: Nose normal.  Mouth/Throat: Oropharynx is clear and moist. No  oropharyngeal exudate.  Eyes: Pupils are equal, round, and reactive to light. Conjunctivae and EOM are normal. Right eye exhibits no discharge. Left eye exhibits no discharge. No scleral icterus.  Neck: Normal range of motion. Neck supple. No tracheal deviation present. No thyromegaly present.  Cardiovascular: Normal rate, regular rhythm and normal heart sounds.  Exam reveals no gallop and no friction rub.   No murmur heard. Pulmonary/Chest: Effort normal and breath sounds normal. She has no wheezes. She has no rales.  Abdominal: Soft. Bowel sounds are normal. She exhibits no distension and no mass. There is no tenderness. There is no rebound and no guarding.  Musculoskeletal: Normal range of motion. She exhibits edema (in bilateral feel 2+).  Lymphadenopathy:    She has no cervical adenopathy.  Neurological: She is alert and oriented to person, place, and time. She has normal reflexes. No cranial nerve deficit. Gait normal. Coordination normal.  Skin: Skin is warm and dry. No rash noted.  Psychiatric: Mood, memory, affect and judgment normal.  Nursing note and vitals reviewed.  LABORATORY DATA:  I have reviewed the data as listed Lab Results  Component Value Date   WBC 6.3 02/08/2017   HGB 11.2 (L) 02/08/2017   HCT 35.6 (L) 02/08/2017   MCV 89.7 02/08/2017   PLT 81 (L) 02/08/2017   CMP     Component Value Date/Time   NA 133 (L) 04/27/2017 1610   K 4.1 04/27/2017 1610   CL 94 (L) 04/27/2017 1610   CO2 30 04/27/2017 1610   GLUCOSE 93 04/27/2017 1610   BUN 26 (H) 04/27/2017 1610   CREATININE 1.13 (H) 04/27/2017 1610   CREATININE 1.01 (H) 12/31/2016 0815   CALCIUM 9.2 04/27/2017 1610   PROT 6.8 04/21/2017 1347   ALBUMIN 3.6 04/27/2017 1610   AST 38 04/21/2017 1347   ALT 24 04/21/2017 1347   ALKPHOS 118 04/21/2017 1347   BILITOT 0.8 04/21/2017 1347   GFRNONAA 50 (L) 04/27/2017 1610   GFRNONAA 51 (L) 12/18/2015 1645   GFRAA 58 (L) 04/27/2017 1610   GFRAA 59 (L) 12/18/2015  1645     RADIOGRAPHIC STUDIES: I have personally reviewed the radiological images as listed and agreed with the findings in the report. No results found.  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  Proceed with getting labs today. Depending on her lab results we will let her know if she needs any more IV iron.  Thrombocytopenia stable. Continue to monitor.   Labs every 3 months; CBC, CMP, Iron study, ferritin, B12. RTC for f/u in 3 months.    All questions were answered. The patient knows to call the clinic with any problems, questions or concerns.   This note was electronically signed.    Twana First, MD  05/24/2017 10:49 AM

## 2017-05-25 ENCOUNTER — Other Ambulatory Visit (HOSPITAL_COMMUNITY): Payer: Self-pay | Admitting: Oncology

## 2017-05-25 NOTE — Progress Notes (Signed)
Please call the patient to let her know her ferritin is low and she needs some more iron, since she is anemic. I've set her up for 2 doses of feraheme. Please see when patient wants to come in, and it will need insurance approval. TY

## 2017-05-31 ENCOUNTER — Encounter (HOSPITAL_COMMUNITY): Payer: Self-pay

## 2017-05-31 ENCOUNTER — Encounter (HOSPITAL_BASED_OUTPATIENT_CLINIC_OR_DEPARTMENT_OTHER): Payer: Medicare Other

## 2017-05-31 VITALS — BP 151/51 | HR 89 | Temp 98.6°F | Resp 18 | Wt 238.2 lb

## 2017-05-31 DIAGNOSIS — D500 Iron deficiency anemia secondary to blood loss (chronic): Secondary | ICD-10-CM

## 2017-05-31 MED ORDER — SODIUM CHLORIDE 0.9% FLUSH
10.0000 mL | INTRAVENOUS | Status: DC | PRN
  Administered 2017-05-31: 10 mL
  Filled 2017-05-31: qty 10

## 2017-05-31 MED ORDER — SODIUM CHLORIDE 0.9 % IV SOLN
Freq: Once | INTRAVENOUS | Status: AC
  Administered 2017-05-31: 500 mL via INTRAVENOUS

## 2017-05-31 MED ORDER — FERUMOXYTOL INJECTION 510 MG/17 ML
510.0000 mg | Freq: Once | INTRAVENOUS | Status: AC
  Administered 2017-05-31: 510 mg via INTRAVENOUS
  Filled 2017-05-31: qty 17

## 2017-05-31 NOTE — Progress Notes (Signed)
To treatment room for IV infusion feraheme.  Stated leg discomfort and overall weakness and fatigue.  No other complaints voiced.  No s/s of distress.   Patient tolerated iron infusion with no complaints.  Good blood return noted before and after administration of iron.  VSS stable with discharge.  IV site clean and dry with no bruising or swelling noted at site.  Band aid applied.  Left ambulatory with no s/s of distress.

## 2017-05-31 NOTE — Patient Instructions (Signed)
[place] at [facility]  Discharge Instructions:  You received iron infusion today.  Call for any questions or concerns.  Keep all scheduled appointments as directed.  _______________________________________________________________  Thank you for choosing [place] at [facility] to provide your oncology and hematology care.  To afford each patient quality time with our providers, please arrive at least 15 minutes before your scheduled appointment.  You need to re-schedule your appointment if you arrive 10 or more minutes late.  We strive to give you quality time with our providers, and arriving late affects you and other patients whose appointments are after yours.  Also, if you no show three or more times for appointments you may be dismissed from the clinic.  Again, thank you for choosing [place] [facility] [place] hope is that these requests will allow you access to exceptional care and in a timely manner. _______________________________________________________________  If you have questions after your visit, please contact our office at [phone] between the hours of 8:30 a.m. and 5:00 p.m. Voicemails left after 4:30 p.m. will not be returned until the following business day. _______________________________________________________________  For prescription refill requests, have your pharmacy contact our office. _______________________________________________________________  Recommendations made by the consultant and any test results will be sent to your referring physician. _______________________________________________________________

## 2017-06-07 ENCOUNTER — Ambulatory Visit (HOSPITAL_COMMUNITY): Payer: Medicare Other

## 2017-06-08 ENCOUNTER — Telehealth: Payer: Self-pay | Admitting: Internal Medicine

## 2017-06-08 DIAGNOSIS — R197 Diarrhea, unspecified: Secondary | ICD-10-CM

## 2017-06-08 NOTE — Telephone Encounter (Signed)
Spoke with the pt, she has had watery diarrhea for several weeks, she said last week she was going to the bathroom every 10 minutes. She is having pain in her lower abd. Pt will see a little blood when she has to wipe a lot. She has not taken her temp but said she has had chills. The last abx she was on was a UTI preventative abx (she cannot remember the name of it) in June, nothing since then. She is concerned she has cdiff again. Please advise.

## 2017-06-08 NOTE — Telephone Encounter (Signed)
[phone]  PLEASE CALL PATIENT, HAVING RUNNY BOWEL MOVEMENTS, FEELS LIKE SHE HAS C-DIFF AGAIN

## 2017-06-08 NOTE — Telephone Encounter (Signed)
Needs stool for CDI ASAP. If anything worsens, let us know. Would consider starting her on empiric abx if worsening diarrhea while waiting on pending stool sample but would need stool sample FIRST before starting abx.

## 2017-06-09 NOTE — Addendum Note (Signed)
Addended by: Claudina Lick on: 06/09/2017 10:33 AM   Modules accepted: Orders

## 2017-06-09 NOTE — Telephone Encounter (Signed)
Pt is aware. Now she said she wasn't sure if it is cdiff or not. She said she couldn't come to [place] to pick up container for stool sample. I have mailed a small container to her but if she worsens she should go to ED or pcp or call us. Pt agrees.

## 2017-06-10 ENCOUNTER — Telehealth: Payer: Self-pay | Admitting: "Endocrinology

## 2017-06-10 NOTE — Telephone Encounter (Signed)
Pt was asking about [place] paperwork. I advised her it was sent and that we would send them any information needed.

## 2017-06-10 NOTE — Telephone Encounter (Signed)
Penny Martinez is asking for a phone call from Penny Martinez she has to ask Penny Martinez a question, please advise?

## 2017-06-12 LAB — CLOSTRIDIUM DIFFICILE BY PCR: Toxigenic C. Difficile by PCR: NOT DETECTED

## 2017-06-15 NOTE — Telephone Encounter (Signed)
Negative CDI. I hope she is doing ok?

## 2017-06-16 ENCOUNTER — Encounter: Payer: Self-pay | Admitting: Gastroenterology

## 2017-06-16 ENCOUNTER — Ambulatory Visit (INDEPENDENT_AMBULATORY_CARE_PROVIDER_SITE_OTHER): Payer: Medicare Other | Admitting: Gastroenterology

## 2017-06-16 VITALS — BP 118/70 | HR 82 | Temp 97.4°F | Ht 61.5 in | Wt 230.2 lb

## 2017-06-16 DIAGNOSIS — D500 Iron deficiency anemia secondary to blood loss (chronic): Secondary | ICD-10-CM

## 2017-06-16 DIAGNOSIS — K7469 Other cirrhosis of liver: Secondary | ICD-10-CM | POA: Diagnosis not present

## 2017-06-16 MED ORDER — PROPRANOLOL HCL 10 MG PO TABS: ORAL_TABLET | ORAL | 3 refills | Status: DC

## 2017-06-16 NOTE — Patient Instructions (Addendum)
We have ordered an ultrasound of your liver for routine purposes.  I have increased propanolol to adding 1 tablet in the evening. Keep taking 2 tablets each morning, and add the additional tablet in the evening. If you do not tolerate this, let me know and we will go back to just 2 tablets each morning.   I will see you back in 3 months!

## 2017-06-16 NOTE — Progress Notes (Signed)
Referring Provider: Vesta Mixer Primary Care Physician:  Antionette Fairy, PA-C Primary GI: Dr. Gala Romney   Chief Complaint  Patient presents with  . Follow-up    Cirrhosis    HPI:   Penny Martinez is a 66 y.o. 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 History of chronic diarrhea, with Lomotil working well historically. History of IDA, followed by hematology. Propanolol 20 mg daily for primary bleeding prophylaxis. Needs colonoscopy in 2020. Due for US abdomen.   Husband had GI virus and she had profuse diarrhea as well. Called in and we checked for CDI, which was negative. Pepto helped. Heart rate not at goal today nor at last visit. No abdominal pain. Back to baseline bowel habits. No confusion.   Past Medical History:  Diagnosis Date  . Acid reflux   . Anemia   . Arthritis   . C. difficile colitis June/July 2015  . Cancer ([place])    skin cancer  . CHF (congestive heart failure) ([place])   . Cirrhosis ([place])    likely due to NASH. Negative viral markers 2015  . Complication of anesthesia   . COPD (chronic obstructive pulmonary disease) ([place])   . Diabetes mellitus without complication ([place])   . GERD (gastroesophageal reflux disease)   . History of kidney stones   . Hypercholesteremia   . Hypertension   . Hypothyroidism   . IBS (irritable bowel syndrome)   . Kidney disease   . Liver disease   . Neuropathy   . PONV (postoperative nausea and vomiting)   . Sleep apnea   . Thyroid disease     Past Surgical History:  Procedure Laterality Date  . ABDOMINAL HYSTERECTOMY    . APPENDECTOMY    . CAROTID ARTERY - SUBCLAVIAN ARTERY BYPASS GRAFT Right   . CARPAL TUNNEL RELEASE Right   . CARPAL TUNNEL RELEASE Left 12/03/2016   Procedure: CARPAL TUNNEL RELEASE;  Surgeon: Carole Civil, MD;  Location: [facility];  Service: Orthopedics;  Laterality: Left;  . CATARACT EXTRACTION Bilateral   . CHOLECYSTECTOMY    . COLONOSCOPY N/A 02/19/2014   Dr.  Gala Romney: rectal varices, rectal polyp overlying a varix non-manipulated  . ESOPHAGOGASTRODUODENOSCOPY N/A 02/19/2014   Dr. Gala Romney: esophageal varices, multiple gastric polyps with largest biopsied and hyperplastic. Negative H.pylori  . GIVENS CAPSULE STUDY N/A 05/04/2014   multiple polypoid-like lesions throughout small bowel, scattered superficial non-bleeding erosions more distal bowel, referred to [facility] for enteroscopy. Capsule study not complete, poor images.   Marland Kitchen TRIGGER FINGER RELEASE Left 12/03/2016   Procedure: RELEASE TRIGGER FINGER/A-1 PULLEY LEFT LONG TRIGGER FINGER RELEASE;  Surgeon: Carole Civil, MD;  Location: [facility];  Service: Orthopedics;  Laterality: Left;  . urosepsis      Current Outpatient Prescriptions  Medication Sig Dispense Refill  . amitriptyline (ELAVIL) 25 MG tablet Take 25 mg by mouth at bedtime.    Marland Kitchen aspirin 81 MG tablet Take 81 mg by mouth daily.    . baclofen (LIORESAL) 10 MG tablet Take 10 mg by mouth 3 (three) times daily.    . benazepril (LOTENSIN) 20 MG tablet Take 20 mg by mouth daily.    . Continuous Blood Gluc Sensor (FREESTYLE LIBRE SENSOR SYSTEM) MISC Use one sensor every 10 days. 3 each 2  . CRANBERRY-VITAMIN C PO Take 2 capsules by mouth at bedtime. Dosage is the 25,200 mg cranberry/vitamin C supplement    . diphenhydrAMINE (SOMINEX) 25 MG tablet Take 25 mg  by mouth at bedtime. Reported on 01/31/2016    . diphenoxylate-atropine (LOMOTIL) 2.5-0.025 MG tablet 1-2 tablets every 4-6 hours as needed for diarrhea. 120 tablet 3  . ezetimibe (ZETIA) 10 MG tablet Take 10 mg by mouth daily.    . furosemide (LASIX) 80 MG tablet Take 80 mg by mouth daily.    Marland Kitchen gabapentin (NEURONTIN) 100 MG capsule TAKE (1) CAPSULE BY MOUTH THREE TIMES DAILY 90 capsule 2  . HUMULIN R U-500 KWIKPEN 500 UNIT/ML kwikpen INJECT 130 UNITS S.Q. THREE TIMES DAILY WITH MEALS. 18 mL 0  . HYDROcodone-acetaminophen (NORCO/VICODIN) 5-325 MG tablet Take 1 tablet by mouth every 8 (eight) hours  as needed for moderate pain. 15 tablet 0  . lansoprazole (PREVACID) 30 MG capsule Take 30 mg by mouth daily.    Marland Kitchen levothyroxine (SYNTHROID, LEVOTHROID) 100 MCG tablet TAKE 1 TABLET BY MOUTH ONCE DAILY. 90 tablet 0  . propranolol (INDERAL) 10 MG tablet Take 2 tablets (20 mg total) by mouth daily. 60 tablet 11  . spironolactone (ALDACTONE) 25 MG tablet Take 25 mg by mouth daily.  5  . ULTICARE SHORT PEN NEEDLES 31G X 8 MM MISC USE AS DIRECTED THREE TIMES DAILY. 100 each 5  . VICTOZA 18 MG/3ML SOPN INJECT 1.8 MG S.Q. ONCE DAILY. 9 mL 2   No current facility-administered medications for this visit.     Allergies as of 06/16/2017 - Review Complete 06/16/2017  Allergen Reaction Noted  . Erythromycin Hives 02/15/2014  . Niacin Rash and Shortness Of Breath 03/05/2014  . Penicillins Shortness Of Breath 02/15/2014  . Sulfa antibiotics Hives 02/15/2014  . Statins  03/05/2014  . Codeine Itching 11/26/2016    Family History  Problem Relation Age of Onset  . Diabetes Mother   . Hypertension Mother   . Kidney failure Mother   . Blindness Mother   . Aneurysm Father   . Colon cancer Neg Hx     Social History   Social History  . Marital status: Married    Spouse name: N/A  . Number of children: N/A  . Years of education: N/A   Social History Main Topics  . Smoking status: Former Smoker    Packs/day: 2.00    Years: 35.00    Quit date: 05/05/2003  . Smokeless tobacco: Former Systems developer     Comment: Quit smoking 11 years ago  . Alcohol use No  . Drug use: No  . Sexual activity: Not Currently    Birth control/ protection: Surgical   Other Topics Concern  . None   Social History Narrative  . None    Review of Systems: Gen: Denies fever, chills, anorexia. Denies fatigue, weakness, weight loss.  CV: Denies chest pain, palpitations, syncope, peripheral edema, and claudication. Resp: Denies dyspnea at rest, cough, wheezing, coughing up blood, and pleurisy. GI: see HPI  Derm: Denies rash,  itching, dry skin Psych: Denies depression, anxiety, memory loss, confusion. No homicidal or suicidal ideation.  Heme: Denies bruising, bleeding, and enlarged lymph nodes.  Physical Exam: BP 118/70   Pulse 82   Temp (!) 97.4 F (36.3 C) (Oral)   Ht 5' 1.5" (1.562 m)   Wt 230 lb 3.2 oz (104.4 kg)   BMI 42.79 kg/m  General:   Alert and oriented. No distress noted. Pleasant and cooperative.  Head:  Normocephalic and atraumatic. Eyes:  Conjuctiva clear without scleral icterus. Mouth:  Oral mucosa pink and moist.  Abdomen:  +BS, soft, non-tender and non-distended. No rebound or guarding.  No HSM or masses noted. Msk:  Symmetrical without gross deformities. Normal posture. Extremities:  Without edema. Neurologic:  Alert and  oriented x4 Psych:  Alert and cooperative. Normal mood and affect.  Lab Results  Component Value Date   ALT 26 05/24/2017   AST 40 05/24/2017   ALKPHOS 134 (H) 05/24/2017   BILITOT 0.8 05/24/2017

## 2017-06-17 NOTE — Assessment & Plan Note (Signed)
Monitor. Followed by Hematology. If further progression, consider early interval colonoscopy. Return in 3 months.

## 2017-06-17 NOTE — Assessment & Plan Note (Signed)
NASH cirrhosis compensated. Due for US abdomen now. HR not at goal, so we will continue propanolol 20 mg each morning but add 10 mg in evening. Monitor for any side effects.

## 2017-06-18 ENCOUNTER — Ambulatory Visit (HOSPITAL_COMMUNITY): Payer: Medicare Other

## 2017-06-21 NOTE — Progress Notes (Signed)
cc'ed to pcp °

## 2017-06-22 ENCOUNTER — Other Ambulatory Visit: Payer: Self-pay | Admitting: "Endocrinology

## 2017-06-23 ENCOUNTER — Ambulatory Visit (HOSPITAL_COMMUNITY)
Admission: RE | Admit: 2017-06-23 | Discharge: 2017-06-23 | Disposition: A | Discharge status: Home / Self Care | Payer: Medicare Other | Source: Ambulatory Visit | Attending: Gastroenterology | Admitting: Gastroenterology

## 2017-06-23 DIAGNOSIS — K7469 Other cirrhosis of liver: Secondary | ICD-10-CM

## 2017-06-23 DIAGNOSIS — Z9049 Acquired absence of other specified parts of digestive tract: Secondary | ICD-10-CM | POA: Insufficient documentation

## 2017-06-24 ENCOUNTER — Encounter (HOSPITAL_COMMUNITY): Payer: Medicare Other | Attending: Oncology

## 2017-06-24 ENCOUNTER — Encounter (HOSPITAL_COMMUNITY): Payer: Self-pay

## 2017-06-24 VITALS — BP 129/52 | HR 81 | Temp 98.6°F | Resp 16

## 2017-06-24 DIAGNOSIS — E7800 Pure hypercholesterolemia, unspecified: Secondary | ICD-10-CM | POA: Insufficient documentation

## 2017-06-24 DIAGNOSIS — E039 Hypothyroidism, unspecified: Secondary | ICD-10-CM | POA: Insufficient documentation

## 2017-06-24 DIAGNOSIS — Z23 Encounter for immunization: Secondary | ICD-10-CM

## 2017-06-24 DIAGNOSIS — Z794 Long term (current) use of insulin: Secondary | ICD-10-CM | POA: Insufficient documentation

## 2017-06-24 DIAGNOSIS — I509 Heart failure, unspecified: Secondary | ICD-10-CM | POA: Insufficient documentation

## 2017-06-24 DIAGNOSIS — R161 Splenomegaly, not elsewhere classified: Secondary | ICD-10-CM | POA: Insufficient documentation

## 2017-06-24 DIAGNOSIS — Z87891 Personal history of nicotine dependence: Secondary | ICD-10-CM | POA: Insufficient documentation

## 2017-06-24 DIAGNOSIS — J449 Chronic obstructive pulmonary disease, unspecified: Secondary | ICD-10-CM | POA: Insufficient documentation

## 2017-06-24 DIAGNOSIS — Z7982 Long term (current) use of aspirin: Secondary | ICD-10-CM | POA: Insufficient documentation

## 2017-06-24 DIAGNOSIS — D696 Thrombocytopenia, unspecified: Secondary | ICD-10-CM | POA: Insufficient documentation

## 2017-06-24 DIAGNOSIS — D500 Iron deficiency anemia secondary to blood loss (chronic): Secondary | ICD-10-CM

## 2017-06-24 DIAGNOSIS — I110 Hypertensive heart disease with heart failure: Secondary | ICD-10-CM | POA: Insufficient documentation

## 2017-06-24 DIAGNOSIS — K7460 Unspecified cirrhosis of liver: Secondary | ICD-10-CM | POA: Insufficient documentation

## 2017-06-24 DIAGNOSIS — Z880 Allergy status to penicillin: Secondary | ICD-10-CM | POA: Insufficient documentation

## 2017-06-24 DIAGNOSIS — Z885 Allergy status to narcotic agent status: Secondary | ICD-10-CM | POA: Insufficient documentation

## 2017-06-24 DIAGNOSIS — Z85828 Personal history of other malignant neoplasm of skin: Secondary | ICD-10-CM | POA: Insufficient documentation

## 2017-06-24 DIAGNOSIS — Z79899 Other long term (current) drug therapy: Secondary | ICD-10-CM | POA: Insufficient documentation

## 2017-06-24 DIAGNOSIS — K219 Gastro-esophageal reflux disease without esophagitis: Secondary | ICD-10-CM | POA: Insufficient documentation

## 2017-06-24 DIAGNOSIS — G4730 Sleep apnea, unspecified: Secondary | ICD-10-CM | POA: Insufficient documentation

## 2017-06-24 DIAGNOSIS — E119 Type 2 diabetes mellitus without complications: Secondary | ICD-10-CM | POA: Insufficient documentation

## 2017-06-24 MED ORDER — SODIUM CHLORIDE 0.9 % IV SOLN
510.0000 mg | Freq: Once | INTRAVENOUS | Status: AC
  Administered 2017-06-24: 510 mg via INTRAVENOUS
  Filled 2017-06-24: qty 17

## 2017-06-24 MED ORDER — SODIUM CHLORIDE 0.9 % IV SOLN
Freq: Once | INTRAVENOUS | Status: AC
  Administered 2017-06-24: 14:00:00 via INTRAVENOUS

## 2017-06-24 MED ORDER — INFLUENZA VAC SPLIT QUAD 0.5 ML IM SUSY
0.5000 mL | PREFILLED_SYRINGE | Freq: Once | INTRAMUSCULAR | Status: AC
  Administered 2017-06-24: 0.5 mL via INTRAMUSCULAR

## 2017-06-24 MED ORDER — INFLUENZA VAC SPLIT QUAD 0.5 ML IM SUSY
PREFILLED_SYRINGE | INTRAMUSCULAR | Status: AC
  Filled 2017-06-24: qty 0.5

## 2017-06-24 NOTE — Progress Notes (Signed)
Treatment given per orders. Patient tolerated it well without problems. Vitals stable and discharged home from clinic ambulatory. Follow up as scheduled.  

## 2017-06-24 NOTE — Patient Instructions (Signed)
[place] at [facility] Discharge Instructions  RECOMMENDATIONS MADE BY THE CONSULTANT AND ANY TEST RESULTS WILL BE SENT TO YOUR REFERRING PHYSICIAN.  Feraheme given today per orders.  Patient tolerated it well.   Thank you for choosing [place] at [facility] to provide your oncology and hematology care.  To afford each patient quality time with our provider, please arrive at least 15 minutes before your scheduled appointment time.    If you have a lab appointment with the [place] please come in thru the  Main Entrance and check in at the main information desk  You need to re-schedule your appointment should you arrive 10 or more minutes late.  We strive to give you quality time with our providers, and arriving late affects you and other patients whose appointments are after yours.  Also, if you no show three or more times for appointments you may be dismissed from the clinic at the providers discretion.     Again, thank you for choosing [facility].  Our hope is that these requests will decrease the amount of time that you wait before being seen by our physicians.       _____________________________________________________________  Should you have questions after your visit to [facility], please contact our office at [phone] between the hours of 8:30 a.m. and 4:30 p.m.  Voicemails left after 4:30 p.m. will not be returned until the following business day.  For prescription refill requests, have your pharmacy contact our office.       Resources For Cancer Patients and their Caregivers ? American Cancer Society: Can assist with transportation, wigs, general needs, runs Look Good Feel Better.        365-503-3139 ? Cancer Care: Provides financial assistance, online support groups, medication/co-pay assistance.  1-800-813-HOPE 239-610-9842) ? [place] Assists [place] cancer  patients and their families through emotional , educational and financial support.  562-484-2876 ? [place] DSS Where to apply for food stamps, Medicaid and utility assistance. 248-464-9530 ? [facility]: Transportation to medical appointments. 8455270860 ? Social Security Administration: May apply for disability if have a Stage IV cancer. (514)435-0859 313-611-1569 ? [employer], Disability and Transit Services: Assists with nutrition, care and transit needs. [place] Support Programs: @10RELATIVEDAYS @ > Cancer Support Group  2nd Tuesday of the month 1pm-2pm, Journey Room  > Creative Journey  3rd Tuesday of the month 1130am-1pm, Journey Room  > Look Good Feel Better  1st Wednesday of the month 10am-12 noon, Journey Room (Call [place] to register [phone])

## 2017-06-30 NOTE — Progress Notes (Signed)
No concerning findings on ultrasound. Known cirrhosis. Will repeat in 6 months.

## 2017-07-01 ENCOUNTER — Other Ambulatory Visit: Payer: Self-pay | Admitting: "Endocrinology

## 2017-07-16 ENCOUNTER — Other Ambulatory Visit: Payer: Self-pay | Admitting: "Endocrinology

## 2017-07-20 ENCOUNTER — Other Ambulatory Visit: Payer: Self-pay | Admitting: "Endocrinology

## 2017-07-21 LAB — RENAL FUNCTION PANEL
Albumin: 3.7 g/dL (ref 3.6–5.1)
BUN / CREAT RATIO: 17 (calc) (ref 6–22)
BUN: 26 mg/dL — ABNORMAL HIGH (ref 7–25)
CHLORIDE: 99 mmol/L (ref 98–110)
CO2: 33 mmol/L — ABNORMAL HIGH (ref 20–32)
CREATININE: 1.57 mg/dL — AB (ref 0.50–0.99)
Calcium: 9.6 mg/dL (ref 8.6–10.4)
Glucose, Bld: 227 mg/dL — ABNORMAL HIGH (ref 65–99)
POTASSIUM: 5.1 mmol/L (ref 3.5–5.3)
Phosphorus: 3 mg/dL (ref 2.1–4.3)
SODIUM: 139 mmol/L (ref 135–146)

## 2017-07-21 LAB — HEMOGLOBIN A1C W/OUT EAG: HEMOGLOBIN A1C: 8.4 %{Hb} — AB (ref <5.7)

## 2017-08-06 ENCOUNTER — Ambulatory Visit: Payer: Medicare Other | Admitting: "Endocrinology

## 2017-08-10 ENCOUNTER — Ambulatory Visit (INDEPENDENT_AMBULATORY_CARE_PROVIDER_SITE_OTHER): Payer: Medicare Other | Admitting: "Endocrinology

## 2017-08-10 ENCOUNTER — Encounter: Payer: Self-pay | Admitting: "Endocrinology

## 2017-08-10 VITALS — BP 133/76 | HR 77 | Ht 61.5 in | Wt 232.0 lb

## 2017-08-10 DIAGNOSIS — N183 Chronic kidney disease, stage 3 unspecified: Secondary | ICD-10-CM

## 2017-08-10 DIAGNOSIS — E782 Mixed hyperlipidemia: Secondary | ICD-10-CM | POA: Diagnosis not present

## 2017-08-10 DIAGNOSIS — Z6841 Body Mass Index (BMI) 40.0 and over, adult: Secondary | ICD-10-CM

## 2017-08-10 DIAGNOSIS — E038 Other specified hypothyroidism: Secondary | ICD-10-CM | POA: Diagnosis not present

## 2017-08-10 DIAGNOSIS — E1122 Type 2 diabetes mellitus with diabetic chronic kidney disease: Secondary | ICD-10-CM | POA: Diagnosis not present

## 2017-08-10 NOTE — Patient Instructions (Signed)

## 2017-08-10 NOTE — Progress Notes (Signed)
Subjective:    Patient ID: Penny Martinez, female    DOB: [birth year],    Past Medical History:  Diagnosis Date  . Acid reflux   . Anemia   . Arthritis   . C. difficile colitis June/July 2015  . Cancer ([place])    skin cancer  . CHF (congestive heart failure) ([place])   . 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 Negative viral markers 2015  . Complication of anesthesia   . COPD (chronic obstructive pulmonary disease) ([place])   . Diabetes mellitus without complication ([place])   . GERD (gastroesophageal reflux disease)   . History of kidney stones   . Hypercholesteremia   . Hypertension   . Hypothyroidism   . IBS (irritable bowel syndrome)   . Kidney disease   . Liver disease   . Neuropathy   . PONV (postoperative nausea and vomiting)   . Sleep apnea   . Thyroid disease    Past Surgical History:  Procedure Laterality Date  . ABDOMINAL HYSTERECTOMY    . APPENDECTOMY    . CAROTID ARTERY - SUBCLAVIAN ARTERY BYPASS GRAFT Right   . CARPAL TUNNEL RELEASE Right   . CARPAL TUNNEL RELEASE Left 12/03/2016   Procedure: CARPAL TUNNEL RELEASE;  Surgeon: Carole Civil, MD;  Location: [facility];  Service: Orthopedics;  Laterality: Left;  . CATARACT EXTRACTION Bilateral   . CHOLECYSTECTOMY    . COLONOSCOPY N/A 02/19/2014   Dr. Gala Romney: rectal varices, rectal polyp overlying a varix non-manipulated  . ESOPHAGOGASTRODUODENOSCOPY N/A 02/19/2014   Dr. Gala Romney: esophageal varices, multiple gastric polyps with largest biopsied and hyperplastic. Negative H.pylori  . GIVENS CAPSULE STUDY N/A 05/04/2014   multiple polypoid-like lesions throughout small bowel, scattered superficial non-bleeding erosions more distal bowel, referred to [facility] for enteroscopy. Capsule study not complete, poor images.   Marland Kitchen TRIGGER FINGER RELEASE Left 12/03/2016   Procedure: RELEASE TRIGGER FINGER/A-1 PULLEY LEFT LONG TRIGGER FINGER RELEASE;  Surgeon: Carole Civil, MD;  Location: [facility];  Service: Orthopedics;  Laterality:  Left;  . urosepsis     Social History   Socioeconomic History  . Marital status: Married    Spouse name: None  . Number of children: None  . Years of education: None  . Highest education level: None  Social Needs  . Financial resource strain: None  . Food insecurity - worry: None  . Food insecurity - inability: None  . Transportation needs - medical: None  . Transportation needs - non-medical: None  Occupational History  . None  Tobacco Use  . Smoking status: Former Smoker    Packs/day: 2.00    Years: 35.00    Pack years: 70.00    Last attempt to quit: 05/05/2003    Years since quitting: 14.2  . Smokeless tobacco: Former Systems developer  . Tobacco comment: Quit smoking 11 years ago  Substance and Sexual Activity  . Alcohol use: No  . Drug use: No  . Sexual activity: Not Currently    Birth control/protection: Surgical  Other Topics Concern  . None  Social History Narrative  . None   Outpatient Encounter Medications as of 08/10/2017  Medication Sig  . amitriptyline (ELAVIL) 25 MG tablet Take 25 mg by mouth at bedtime.  Marland Kitchen aspirin 81 MG tablet Take 81 mg by mouth daily.  . baclofen (LIORESAL) 10 MG tablet Take 10 mg by mouth 3 (three) times daily.  . benazepril (LOTENSIN) 20 MG tablet Take 20 mg by mouth daily.  Marland Kitchen  Continuous Blood Gluc Sensor (FREESTYLE LIBRE SENSOR SYSTEM) MISC Use one sensor every 10 days.  Marland Kitchen CRANBERRY-VITAMIN C PO Take 2 capsules by mouth at bedtime. Dosage is the 25,200 mg cranberry/vitamin C supplement  . diphenhydrAMINE (SOMINEX) 25 MG tablet Take 25 mg by mouth at bedtime. Reported on 01/31/2016  . diphenoxylate-atropine (LOMOTIL) 2.5-0.025 MG tablet 1-2 tablets every 4-6 hours as needed for diarrhea.  . ezetimibe (ZETIA) 10 MG tablet Take 10 mg by mouth daily.  . furosemide (LASIX) 80 MG tablet Take 80 mg by mouth daily.  Marland Kitchen gabapentin (NEURONTIN) 100 MG capsule TAKE (1) CAPSULE BY MOUTH THREE TIMES DAILY  . HUMULIN R U-500 KWIKPEN 500 UNIT/ML kwikpen INJECT  130 UNITS S.Q. THREE TIMES DAILY WITH MEALS.  . HYDROcodone-acetaminophen (NORCO/VICODIN) 5-325 MG tablet Take 1 tablet by mouth every 8 (eight) hours as needed for moderate pain.  Marland Kitchen lansoprazole (PREVACID) 30 MG capsule Take 30 mg by mouth daily.  Marland Kitchen levothyroxine (SYNTHROID, LEVOTHROID) 100 MCG tablet TAKE 1 TABLET BY MOUTH ONCE DAILY.  Marland Kitchen propranolol (INDERAL) 10 MG tablet Take 2 tablets in the morning and one in the evening.  Marland Kitchen spironolactone (ALDACTONE) 25 MG tablet Take 25 mg by mouth daily.  Marland Kitchen ULTICARE SHORT PEN NEEDLES 31G X 8 MM MISC USE AS DIRECTED THREE TIMES DAILY.  Marland Kitchen VICTOZA 18 MG/3ML SOPN INJECT 1.8 MG S.Q. ONCE DAILY.   No facility-administered encounter medications on file as of 08/10/2017.    ALLERGIES: Allergies  Allergen Reactions  . Erythromycin Hives  . Niacin Rash and Shortness Of Breath  . Penicillins Shortness Of Breath  . Sulfa Antibiotics Hives  . Statins     Other reaction(s): Muscle Pain Other reaction(s): Muscle Pain  . Codeine Itching   VACCINATION STATUS: Immunization History  Administered Date(s) Administered  . Influenza,inj,Quad PF,6+ Mos 07/24/2016, 06/24/2017    Diabetes  She presents for her follow-up diabetic visit. She has type 2 diabetes mellitus. Onset time: She was diagnosed at approximate age of 61 years. Her disease course has been improving. There are no hypoglycemic associated symptoms. Pertinent negatives for hypoglycemia include no confusion, headaches, pallor or seizures. Associated symptoms include fatigue, polydipsia and polyuria. Pertinent negatives for diabetes include no chest pain and no polyphagia. There are no hypoglycemic complications. Symptoms are improving. Diabetic complications include heart disease, nephropathy and PVD. Risk factors for coronary artery disease include diabetes mellitus, dyslipidemia, obesity, hypertension, sedentary lifestyle and tobacco exposure. Current diabetic treatment includes intensive insulin program.  She is compliant with treatment most of the time. Her weight is stable. She is following a generally unhealthy diet. Prior visit with dietitian: She declined referral to a dietitian. She never participates in exercise. Her home blood glucose trend is decreasing steadily. Her breakfast blood glucose range is generally 140-180 mg/dl. Her lunch blood glucose range is generally 180-200 mg/dl. Her dinner blood glucose range is generally 180-200 mg/dl. Her bedtime blood glucose range is generally 180-200 mg/dl. Her overall blood glucose range is 180-200 mg/dl. An ACE inhibitor/angiotensin II receptor blocker is being taken. Eye exam is current.  Hypertension  This is a chronic problem. The current episode started more than 1 year ago. The problem is controlled. Pertinent negatives include no chest pain, headaches, palpitations or shortness of breath. Risk factors for coronary artery disease include diabetes mellitus, dyslipidemia, obesity and sedentary lifestyle. Hypertensive end-organ damage includes PVD.  Hyperlipidemia  This is a chronic problem. The current episode started more than 1 year ago. Pertinent negatives include no chest  pain, myalgias or shortness of breath. Risk factors for coronary artery disease include diabetes mellitus, dyslipidemia, hypertension, a sedentary lifestyle and obesity.    Review of Systems  Constitutional: Positive for fatigue. Negative for unexpected weight change.  HENT: Negative for trouble swallowing and voice change.   Eyes: Negative for visual disturbance.  Respiratory: Negative for cough, shortness of breath and wheezing.   Cardiovascular: Negative for chest pain, palpitations and leg swelling.  Gastrointestinal: Negative for diarrhea, nausea and vomiting.  Endocrine: Positive for polydipsia and polyuria. Negative for cold intolerance, heat intolerance and polyphagia.  Musculoskeletal: Negative for arthralgias and myalgias.  Skin: Negative for color change, pallor,  rash and wound.  Neurological: Negative for seizures and headaches.  Psychiatric/Behavioral: Negative for confusion and suicidal ideas.    Objective:    BP 133/76   Pulse 77   Ht 5' 1.5" (1.562 m)   Wt 232 lb (105.2 kg)   BMI 43.13 kg/m   Wt Readings from Last 3 Encounters:  08/10/17 232 lb (105.2 kg)  06/16/17 230 lb 3.2 oz (104.4 kg)  05/31/17 238 lb 3.2 oz (108 kg)    Physical Exam  Constitutional: She is oriented to person, place, and time. She appears well-developed.  HENT:  Head: Normocephalic and atraumatic.  Eyes: EOM are normal.  Neck: Normal range of motion. Neck supple. No tracheal deviation present. No thyromegaly present.  Cardiovascular: Normal rate and regular rhythm.  Pulmonary/Chest: Effort normal and breath sounds normal.  Abdominal: Soft. Bowel sounds are normal. There is no tenderness. There is no guarding.  Musculoskeletal: Normal range of motion. She exhibits no edema.  Neurological: She is alert and oriented to person, place, and time. She has normal reflexes. No cranial nerve deficit. Coordination normal.  Skin: Skin is warm and dry. No rash noted. No erythema. No pallor.  Psychiatric: She has a normal mood and affect. Judgment normal.    Results for orders placed or performed in visit on 07/20/17  Renal function panel  Result Value Ref Range   Glucose, Bld 227 (H) 65 - 99 mg/dL   BUN 26 (H) 7 - 25 mg/dL   Creat 1.57 (H) 0.50 - 0.99 mg/dL   BUN/Creatinine Ratio 17 6 - 22 (calc)   Sodium 139 135 - 146 mmol/L   Potassium 5.1 3.5 - 5.3 mmol/L   Chloride 99 98 - 110 mmol/L   CO2 33 (H) 20 - 32 mmol/L   Calcium 9.6 8.6 - 10.4 mg/dL   Phosphorus 3.0 2.1 - 4.3 mg/dL   Albumin 3.7 3.6 - 5.1 g/dL  Hemoglobin A1C w/out eAG  Result Value Ref Range   Hgb A1c MFr Bld 8.4 (H) <5.7 % of total Hgb   Complete Blood Count (Most recent): Lab Results  Component Value Date   WBC 4.4 05/24/2017   HGB 10.1 (L) 05/24/2017   HCT 31.9 (L) 05/24/2017   MCV 83.3  05/24/2017   PLT 73 (L) 05/24/2017    Diabetic Labs (most recent): Lab Results  Component Value Date   HGBA1C 8.4 (H) 07/20/2017   HGBA1C 8.7 (H) 04/21/2017   HGBA1C 6.4 (H) 12/31/2016     Assessment & Plan:   1. Uncontrolled type 2 diabetes mellitus with complication, with long-term current use of insulin ([place])  Her diabetes is  complicated by chronic kidney disease and peripheral arterial disease as well as CHF.  patient remains at a high risk for more acute and chronic complications of diabetes which include CAD, CVA, CKD, retinopathy,  and neuropathy. These are all discussed in detail with the patient.  Patient came with slightly better blood glucose profile, A1c 8.4%, was 8.7% last visit.     Glucose logs and insulin administration records pertaining to this visit,  to be scanned into patient's records.  Recent labs reviewed.   - I have re-counseled the patient on diet management and weight loss  by adopting a carbohydrate restricted / protein rich  Diet.  -  Suggestion is made for her to avoid simple carbohydrates  from her diet including Cakes, Sweet Desserts / Pastries, Ice Cream, Soda (diet and regular), Sweet Tea, Candies, Chips, Cookies, Store Bought Juices, Alcohol in Excess of  1-2 drinks a day, Artificial Sweeteners, and "Sugar-free" Products. This will help patient to have stable blood glucose profile and potentially avoid unintended weight gain.   - Patient is advised to stick to a routine mealtimes to eat 3 meals  a day and avoid unnecessary snacks ( to snack only to correct hypoglycemia).   - I have approached patient with the following individualized plan to manage diabetes and patient agrees.  - She will continue to need U500 insulin. - Her recent glycemic profile is  significantly better and near target, I have advised her to continue  Humulin  U500 at 130 units with breakfast, 130 units with lunch, and 130 units with supper for pre-meal glucose profile above  90 mg/dL associated with  strict monitoring her blood glucose 4 times a day-before meals and at bedtime.  - She is warned not to inject insulin without proper monitoring.  -She will continue Victoza 1.8 mg.  -Patient is encouraged to call clinic for blood glucose levels less than 70 or above 300 mg /dl. - Her continuous glucose monitoring device is not covered by her insurance, still in process.  -Advised to avoid unnecessary evening snacks.  -she is not interested  to explore option of bariatric surgery. -She declined referral to the dietitian.  -Patient is not a candidate for metformin andSGLT2 inhibitors due to CKD. -   She is offered low dose dex supression test to screen endogenous steroid burden, waiting for results. 2015 abdominal CT, not dedicated to adrenals, showed normal adrenals anatomy.  - Patient specific target  for A1c; LDL, HDL, Triglycerides, and  Waist Circumference were discussed in detail.  2) BP/HTN:  controlled. I advised her to continue her current blood pressure medications including ACEI/ARB. 3) Lipids/HPL:  continue statins. 4)  Weight/Diet: consult with a dietitian in progress, exercise, and carbohydrates information provided.  4) Primary hypothyroidism  Her thyroid function tests are consistent with appropriate replacement. I will  continue levothyroxine 181mg po qam.   - We discussed about correct intake of levothyroxine, at fasting, with water, separated by at least 30 minutes from breakfast, and separated by more than 4 hours from calcium, iron, multivitamins, acid reflux medications (PPIs). -Patient is made aware of the fact that thyroid hormone replacement is needed for life, dose to be adjusted by periodic monitoring of thyroid function tests.  6) Chronic Care/Health Maintenance:  -Patient is  on ACEI/ARB and Statin medications and encouraged to continue to follow up with Ophthalmology, Podiatrist at least yearly or according to recommendations, and  advised to  stay away from smoking. I have recommended yearly flu vaccine and pneumonia vaccination at least every 5 years; and  sleep for at least 7 hours a day. - She cannot exercise at optimum level. I advised patient to maintain close follow up with  her PCP for primary care needs.  - Time spent with the patient: 25 min, of which >50% was spent in reviewing her sugar logs , discussing her hypo- and hyper-glycemic episodes, reviewing her current and  previous labs and insulin doses and developing a plan to avoid hypo- and hyper-glycemia.    Follow up plan: Return in about 3 months (around 11/08/2017) for follow up with pre-visit labs, meter, and logs.  Glade Lloyd, MD Phone: [phone]  Fax: [phone]  This note was partially dictated with voice recognition software. Similar sounding words can be transcribed inadequately or may not  be corrected upon review.  08/10/2017, 2:27 PM

## 2017-08-11 ENCOUNTER — Other Ambulatory Visit: Payer: Self-pay | Admitting: "Endocrinology

## 2017-08-24 ENCOUNTER — Emergency Department (HOSPITAL_COMMUNITY): Payer: Medicare Other

## 2017-08-24 ENCOUNTER — Other Ambulatory Visit: Payer: Self-pay

## 2017-08-24 ENCOUNTER — Other Ambulatory Visit (HOSPITAL_COMMUNITY): Payer: Medicare Other

## 2017-08-24 ENCOUNTER — Encounter (HOSPITAL_COMMUNITY): Payer: Self-pay | Admitting: Emergency Medicine

## 2017-08-24 ENCOUNTER — Ambulatory Visit (HOSPITAL_COMMUNITY): Payer: Medicare Other

## 2017-08-24 ENCOUNTER — Emergency Department (HOSPITAL_COMMUNITY)
Admission: EM | Admit: 2017-08-24 | Discharge: 2017-08-24 | Disposition: A | Discharge status: Home / Self Care | Payer: Medicare Other | Attending: Emergency Medicine | Admitting: Emergency Medicine

## 2017-08-24 DIAGNOSIS — R4182 Altered mental status, unspecified: Secondary | ICD-10-CM | POA: Insufficient documentation

## 2017-08-24 DIAGNOSIS — Z79899 Other long term (current) drug therapy: Secondary | ICD-10-CM | POA: Insufficient documentation

## 2017-08-24 DIAGNOSIS — J449 Chronic obstructive pulmonary disease, unspecified: Secondary | ICD-10-CM | POA: Insufficient documentation

## 2017-08-24 DIAGNOSIS — E1122 Type 2 diabetes mellitus with diabetic chronic kidney disease: Secondary | ICD-10-CM | POA: Diagnosis not present

## 2017-08-24 DIAGNOSIS — Z7982 Long term (current) use of aspirin: Secondary | ICD-10-CM | POA: Insufficient documentation

## 2017-08-24 DIAGNOSIS — R319 Hematuria, unspecified: Secondary | ICD-10-CM | POA: Diagnosis not present

## 2017-08-24 DIAGNOSIS — Z794 Long term (current) use of insulin: Secondary | ICD-10-CM | POA: Diagnosis not present

## 2017-08-24 DIAGNOSIS — R5383 Other fatigue: Secondary | ICD-10-CM | POA: Diagnosis not present

## 2017-08-24 DIAGNOSIS — R531 Weakness: Secondary | ICD-10-CM | POA: Insufficient documentation

## 2017-08-24 DIAGNOSIS — R51 Headache: Secondary | ICD-10-CM | POA: Diagnosis not present

## 2017-08-24 DIAGNOSIS — Z85828 Personal history of other malignant neoplasm of skin: Secondary | ICD-10-CM | POA: Insufficient documentation

## 2017-08-24 DIAGNOSIS — Z87891 Personal history of nicotine dependence: Secondary | ICD-10-CM | POA: Diagnosis not present

## 2017-08-24 DIAGNOSIS — N390 Urinary tract infection, site not specified: Secondary | ICD-10-CM | POA: Diagnosis not present

## 2017-08-24 DIAGNOSIS — N189 Chronic kidney disease, unspecified: Secondary | ICD-10-CM | POA: Diagnosis not present

## 2017-08-24 DIAGNOSIS — R410 Disorientation, unspecified: Secondary | ICD-10-CM | POA: Diagnosis present

## 2017-08-24 DIAGNOSIS — E039 Hypothyroidism, unspecified: Secondary | ICD-10-CM | POA: Diagnosis not present

## 2017-08-24 DIAGNOSIS — I130 Hypertensive heart and chronic kidney disease with heart failure and stage 1 through stage 4 chronic kidney disease, or unspecified chronic kidney disease: Secondary | ICD-10-CM | POA: Diagnosis not present

## 2017-08-24 DIAGNOSIS — I509 Heart failure, unspecified: Secondary | ICD-10-CM | POA: Insufficient documentation

## 2017-08-24 LAB — CBC WITH DIFFERENTIAL/PLATELET
Basophils Absolute: 0 10*3/uL (ref 0.0–0.1)
Basophils Relative: 0 %
EOS ABS: 0.1 10*3/uL (ref 0.0–0.7)
EOS PCT: 2 %
HCT: 35.2 % — ABNORMAL LOW (ref 36.0–46.0)
Hemoglobin: 11.5 g/dL — ABNORMAL LOW (ref 12.0–15.0)
LYMPHS ABS: 0.9 10*3/uL (ref 0.7–4.0)
LYMPHS PCT: 25 %
MCH: 30.3 pg (ref 26.0–34.0)
MCHC: 32.7 g/dL (ref 30.0–36.0)
MCV: 92.9 fL (ref 78.0–100.0)
MONO ABS: 0.3 10*3/uL (ref 0.1–1.0)
Monocytes Relative: 8 %
Neutro Abs: 2.3 10*3/uL (ref 1.7–7.7)
Neutrophils Relative %: 65 %
PLATELETS: 51 10*3/uL — AB (ref 150–400)
RBC: 3.79 MIL/uL — ABNORMAL LOW (ref 3.87–5.11)
RDW: 15.9 % — AB (ref 11.5–15.5)
WBC: 3.5 10*3/uL — AB (ref 4.0–10.5)

## 2017-08-24 LAB — COMPREHENSIVE METABOLIC PANEL
ALT: 29 U/L (ref 14–54)
ANION GAP: 12 (ref 5–15)
AST: 38 U/L (ref 15–41)
Albumin: 3.5 g/dL (ref 3.5–5.0)
Alkaline Phosphatase: 149 U/L — ABNORMAL HIGH (ref 38–126)
BUN: 49 mg/dL — ABNORMAL HIGH (ref 6–20)
CHLORIDE: 96 mmol/L — AB (ref 101–111)
CO2: 26 mmol/L (ref 22–32)
Calcium: 9.5 mg/dL (ref 8.9–10.3)
Creatinine, Ser: 1.67 mg/dL — ABNORMAL HIGH (ref 0.44–1.00)
GFR, EST AFRICAN AMERICAN: 36 mL/min — AB (ref >60)
GFR, EST NON AFRICAN AMERICAN: 31 mL/min — AB (ref >60)
Glucose, Bld: 329 mg/dL — ABNORMAL HIGH (ref 65–99)
POTASSIUM: 4.7 mmol/L (ref 3.5–5.1)
SODIUM: 134 mmol/L — AB (ref 135–145)
Total Bilirubin: 0.9 mg/dL (ref 0.3–1.2)
Total Protein: 7.2 g/dL (ref 6.5–8.1)

## 2017-08-24 LAB — TSH: TSH: 1.47 u[IU]/mL (ref 0.350–4.500)

## 2017-08-24 LAB — URINALYSIS, ROUTINE W REFLEX MICROSCOPIC
BILIRUBIN URINE: NEGATIVE
Glucose, UA: 150 mg/dL — AB
HGB URINE DIPSTICK: NEGATIVE
KETONES UR: NEGATIVE mg/dL
NITRITE: POSITIVE — AB
PROTEIN: NEGATIVE mg/dL
SPECIFIC GRAVITY, URINE: 1.006 (ref 1.005–1.030)
pH: 5 (ref 5.0–8.0)

## 2017-08-24 MED ORDER — CIPROFLOXACIN HCL 250 MG PO TABS
500.0000 mg | ORAL_TABLET | Freq: Once | ORAL | Status: AC
  Administered 2017-08-24: 500 mg via ORAL
  Filled 2017-08-24: qty 2

## 2017-08-24 MED ORDER — SODIUM CHLORIDE 0.9 % IV BOLUS (SEPSIS)
500.0000 mL | Freq: Once | INTRAVENOUS | Status: AC
  Administered 2017-08-24: 500 mL via INTRAVENOUS

## 2017-08-24 MED ORDER — CIPROFLOXACIN HCL 500 MG PO TABS: ORAL_TABLET | ORAL | 0 refills | Status: DC

## 2017-08-24 NOTE — ED Notes (Signed)
Pt back from MRI 

## 2017-08-24 NOTE — ED Provider Notes (Signed)
[facility] EMERGENCY DEPARTMENT Provider Note   CSN: [id number] Arrival date & time: 08/24/17  1439     History   Chief Complaint Chief Complaint  Patient presents with  . Altered Mental Status    HPI Penny Martinez is a 66 y.o. female.  HPI   Patient presenting to the ED to be evaluated for altered mental status.  Patient's husband reports that she has had some confusion and memory loss over the last few months, however this seems to be getting worse especially over the last few days.  he states that she has been more fatigued.  Today her blood sugar was 500 at home.  Patient denies any current nausea, vomiting, chest pain or shortness of breath.  She does report a mild headache, but she denies any dysuria, hematuria, frequency, lightheadedness, syncope, or any numbness to her bilateral lower extremities.  She denies feeling like one side is weaker than the other.   Past Medical History:  Diagnosis Date  . Acid reflux   . Anemia   . Arthritis   . C. difficile colitis June/July 2015  . Cancer ([place])    skin cancer  . CHF (congestive heart failure) ([place])   . 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 Negative viral markers 2015  . Complication of anesthesia   . COPD (chronic obstructive pulmonary disease) ([place])   . Diabetes mellitus without complication ([place])   . GERD (gastroesophageal reflux disease)   . History of kidney stones   . Hypercholesteremia   . Hypertension   . Hypothyroidism   . IBS (irritable bowel syndrome)   . Kidney disease   . Liver disease   . Neuropathy   . PONV (postoperative nausea and vomiting)   . Sleep apnea   . Thyroid disease     Patient Active Problem List   Diagnosis Date Noted  . Uncontrolled type 2 diabetes mellitus with complication ([place]) 83/41/9622  . Mixed hyperlipidemia 04/27/2017  . Hyponatremia 04/27/2017  . Chronic diarrhea 12/15/2016  . Carpal tunnel syndrome, left   . Trigger middle finger of left hand   .  Thrombocytopenia ([place]) 11/04/2016  . Iron deficiency anemia due to chronic blood loss 01/31/2016  . Other specified hypothyroidism 09/20/2015  . Essential hypertension, benign 07/12/2015  . Morbid obesity with BMI of 40.0-44.9, adult ([place]) 07/12/2015  . Hepatic cirrhosis ([place]) 04/02/2014  . Acute colitis 03/05/2014  . Abdominal pain, acute, left lower quadrant 03/05/2014  . Colitis 03/05/2014  . Splenomegaly, congestive, chronic 02/17/2014  . Sepsis ([place]) 02/15/2014  . Absolute anemia 02/15/2014  . Diabetes mellitus with stage 3 chronic kidney disease ([place]) 02/15/2014  . CHF (congestive heart failure) ([place]) 02/15/2014  . CKD (chronic kidney disease) 02/15/2014  . UTI (lower urinary tract infection) 02/15/2014    Past Surgical History:  Procedure Laterality Date  . ABDOMINAL HYSTERECTOMY    . APPENDECTOMY    . CAROTID ARTERY - SUBCLAVIAN ARTERY BYPASS GRAFT Right   . CARPAL TUNNEL RELEASE Right   . CARPAL TUNNEL RELEASE Left 12/03/2016   Procedure: CARPAL TUNNEL RELEASE;  Surgeon: Carole Civil, MD;  Location: [facility];  Service: Orthopedics;  Laterality: Left;  . CATARACT EXTRACTION Bilateral   . CHOLECYSTECTOMY    . COLONOSCOPY N/A 02/19/2014   Dr. Gala Romney: rectal varices, rectal polyp overlying a varix non-manipulated  . ESOPHAGOGASTRODUODENOSCOPY N/A 02/19/2014   Dr. Gala Romney: esophageal varices, multiple gastric polyps with largest biopsied and hyperplastic. Negative H.pylori  . GIVENS CAPSULE  STUDY N/A 05/04/2014   multiple polypoid-like lesions throughout small bowel, scattered superficial non-bleeding erosions more distal bowel, referred to [facility] for enteroscopy. Capsule study not complete, poor images.   Marland Kitchen TRIGGER FINGER RELEASE Left 12/03/2016   Procedure: RELEASE TRIGGER FINGER/A-1 PULLEY LEFT LONG TRIGGER FINGER RELEASE;  Surgeon: Carole Civil, MD;  Location: [facility];  Service: Orthopedics;  Laterality: Left;  . urosepsis      OB History    Gravida Para Term Preterm  AB Living             3   SAB TAB Ectopic Multiple Live Births                   Home Medications    Prior to Admission medications   Medication Sig Start Date End Date Taking? Authorizing Provider  amitriptyline (ELAVIL) 25 MG tablet Take 25 mg by mouth at bedtime.   Yes [provider]  aspirin 81 MG tablet Take 81 mg by mouth daily.   Yes [provider]  baclofen (LIORESAL) 10 MG tablet Take 10 mg by mouth 3 (three) times daily.   Yes [provider]  benazepril (LOTENSIN) 20 MG tablet Take 20 mg by mouth every morning.    Yes [provider]  CRANBERRY-VITAMIN C PO Take 2 capsules by mouth at bedtime. Dosage is the 25,200 mg cranberry/vitamin C supplement   Yes [provider]  diphenhydrAMINE (SOMINEX) 25 MG tablet Take 25 mg by mouth at bedtime. Reported on 01/31/2016   Yes [provider]  diphenoxylate-atropine (LOMOTIL) 2.5-0.025 MG tablet 1-2 tablets every 4-6 hours as needed for diarrhea. 12/15/16  Yes Annitta Needs, NP  ezetimibe (ZETIA) 10 MG tablet Take 10 mg by mouth every morning.    Yes [provider]  furosemide (LASIX) 80 MG tablet Take 80 mg by mouth every morning.    Yes [provider]  gabapentin (NEURONTIN) 100 MG capsule TAKE (1) CAPSULE BY MOUTH THREE TIMES DAILY Patient taking differently: TAKE (1) CAPSULE BY MOUTH IN THE MORNING AND TWO CAPSULES AT BEDTIME 06/22/17  Yes Nida, Marella Chimes, MD  HUMULIN R U-500 KWIKPEN 500 UNIT/ML kwikpen INJECT 130 UNITS S.Q. THREE TIMES DAILY WITH MEALS. 08/11/17  Yes Cassandria Anger, MD  lansoprazole (PREVACID) 30 MG capsule Take 30 mg by mouth every morning.    Yes [provider]  levothyroxine (SYNTHROID, LEVOTHROID) 100 MCG tablet TAKE 1 TABLET BY MOUTH ONCE DAILY. 07/02/17  Yes Nida, Marella Chimes, MD  propranolol (INDERAL) 10 MG tablet Take 2 tablets in the morning and one in the evening. Patient taking differently: Take 10-20 mg by  mouth See admin instructions. Take 2 tablets in the morning and one in the evening. 06/16/17  Yes Annitta Needs, NP  spironolactone (ALDACTONE) 25 MG tablet Take 25 mg by mouth daily. 05/19/17  Yes [provider]  VICTOZA 18 MG/3ML SOPN INJECT 1.8 MG S.Q. ONCE DAILY. Patient taking differently: INJECT 1.8 MG ONCE DAILY. 07/16/17  Yes Nida, Marella Chimes, MD  ciprofloxacin (CIPRO) 500 MG tablet Take one tablet every 12 hours for 5 days. 08/24/17   Sinda Leedom S, PA-C  Continuous Blood Gluc Sensor (FREESTYLE LIBRE SENSOR SYSTEM) MISC Use one sensor every 10 days. 04/27/17   Cassandria Anger, MD  ULTICARE SHORT PEN NEEDLES 31G X 8 MM MISC USE AS DIRECTED THREE TIMES DAILY. 06/18/16   Cassandria Anger, MD    Family History Family History  Problem  Relation Age of Onset  . Diabetes Mother   . Hypertension Mother   . Kidney failure Mother   . Blindness Mother   . Aneurysm Father   . Colon cancer Neg Hx     Social History Social History   Tobacco Use  . Smoking status: Former Smoker    Packs/day: 2.00    Years: 35.00    Pack years: 70.00    Last attempt to quit: 05/05/2003    Years since quitting: 14.3  . Smokeless tobacco: Former Systems developer  . Tobacco comment: Quit smoking 11 years ago  Substance Use Topics  . Alcohol use: No  . Drug use: No     Allergies   Erythromycin; Niacin; Penicillins; Sulfa antibiotics; Statins; and Codeine   Review of Systems Review of Systems  Constitutional: Positive for fatigue. Negative for fever.  HENT: Negative for congestion and rhinorrhea.   Eyes: Negative for visual disturbance.  Respiratory: Negative for cough, chest tightness and shortness of breath.   Cardiovascular: Negative for chest pain and leg swelling.  Gastrointestinal: Negative for abdominal pain, constipation, diarrhea, nausea and vomiting.  Musculoskeletal: Negative for gait problem.  Neurological: Positive for headaches. Negative for syncope, light-headedness  and numbness.       AMS  Psychiatric/Behavioral: Positive for confusion.     Physical Exam Updated Vital Signs BP (!) 123/45   Pulse 80   Temp 98.2 F (36.8 C) (Oral)   Resp 18   Ht 5' 1.5" (1.562 m)   Wt 105.2 kg (232 lb)   SpO2 98%   BMI 43.13 kg/m   Physical Exam  Constitutional: She appears well-developed and well-nourished.  Patient seems somewhat somnolent on exam.  HENT:  Head: Normocephalic and atraumatic.  Eyes: Conjunctivae are normal.  Neck: Neck supple.  Cardiovascular: Normal rate and regular rhythm.  No murmur heard. Pulmonary/Chest: Effort normal and breath sounds normal. No respiratory distress.  Abdominal: Soft. There is no tenderness.  Musculoskeletal: She exhibits no edema.  Neurological: She is alert.  Mental Status:  Mildly somnolent on initial exam, but able to give a coherent history. Able to follow commands. Cranial Nerves:  II:  Peripheral visual fields grossly normal, pupils equal, round, reactive to light III,IV, VI: ptosis not present, extra-ocular motions intact bilaterally  V,VII: smile symmetric VIII: hearing grossly normal to voice  X: uvula elevates symmetrically  XI: bilateral shoulder shrug symmetric and strong XII: midline tongue extension without fassiculations Motor:  Normal tone.  Decreased strength to the bilateral lower extremities, but equal bilaterally.  Flexion and plantar flexion strength is decreased but equal bilaterally.  (Thsi is chronic s/p fall per pt's husband). 5/5 Strength to the BUE DTRs: biceps and achilles 2+ symmetric b/l Cerebellar: able to complete finger-to-nose with bilateral upper extremities, but movements are slow. Able to complete heel-to-shin bilaterally, but movements are also slow. CV: 2+ radial and DP/PT pulses  No pronator drift/   Skin: Skin is warm and dry.  Psychiatric: She has a normal mood and affect.  Nursing note and vitals reviewed.    ED Treatments / Results  Labs (all labs  ordered are listed, but only abnormal results are displayed) Labs Reviewed  CBC WITH DIFFERENTIAL/PLATELET - Abnormal; Notable for the following components:      Result Value   WBC 3.5 (*)    RBC 3.79 (*)    Hemoglobin 11.5 (*)    HCT 35.2 (*)    RDW 15.9 (*)    Platelets 51 (*)  All other components within normal limits  COMPREHENSIVE METABOLIC PANEL - Abnormal; Notable for the following components:   Sodium 134 (*)    Chloride 96 (*)    Glucose, Bld 329 (*)    BUN 49 (*)    Creatinine, Ser 1.67 (*)    Alkaline Phosphatase 149 (*)    GFR calc non Af Amer 31 (*)    GFR calc Af Amer 36 (*)    All other components within normal limits  URINALYSIS, ROUTINE W REFLEX MICROSCOPIC - Abnormal; Notable for the following components:   Color, Urine STRAW (*)    APPearance HAZY (*)    Glucose, UA 150 (*)    Nitrite POSITIVE (*)    Leukocytes, UA SMALL (*)    Bacteria, UA RARE (*)    Squamous Epithelial / LPF 0-5 (*)    All other components within normal limits  URINE CULTURE  TSH    EKG  EKG Interpretation  Date/Time:  Tuesday August 24 2017 16:28:10 EST Ventricular Rate:  79 PR Interval:    QRS Duration: 156 QT Interval:  431 QTC Calculation: 495 R Axis:   -102 Text Interpretation:  Sinus rhythm Right bundle branch block Inferior infarct, old since last tracing no significant change Confirmed by Noemi Chapel [id number]) on 08/24/2017 4:43:01 PM       Radiology Mr Brain Wo Contrast  Result Date: 08/24/2017 CLINICAL DATA:  Increased confusion today.  Generalized weakness. EXAM: MRI HEAD WITHOUT CONTRAST TECHNIQUE: Multiplanar, multiecho pulse sequences of the brain and surrounding structures were obtained without intravenous contrast. COMPARISON:  None. FINDINGS: Brain: No acute infarction, hemorrhage, hydrocephalus, extra-axial collection or mass lesion. Moderate, in places confluent, FLAIR hyperintensity in the deep cerebral white matter. 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 Mild cerebral volume loss. Vascular: Major flow voids are preserved. Skull and upper cervical spine: Negative for marrow lesion. Sinuses/Orbits: Left mastoid opacification that is partial. Negative nasopharynx. Bilateral cataract resection. IMPRESSION: 1. No acute finding to explain symptoms. 2. Moderate cerebral white matter disease, likely chronic small vessel ischemia. Electronically Signed   By: Monte Fantasia M.D.   On: 08/24/2017 17:25    Procedures Procedures (including critical care time)  Medications Ordered in ED Medications  sodium chloride 0.9 % bolus 500 mL (0 mLs Intravenous Stopped 08/24/17 1916)  ciprofloxacin (CIPRO) tablet 500 mg (500 mg Oral Given 08/24/17 1832)     Initial Impression / Assessment and Plan / ED Course  I have reviewed the triage vital signs and the nursing notes.  Pertinent labs & imaging results that were available during my care of the patient were reviewed by me and considered in my medical decision making (see chart for details).   1750 Patient in MRI.  Rechecked patient.  She is no longer somnolent on exam. She continues to be A&Ox4. She continues to have no acute findings on her neurologic exam. Updated patient and husband on the plan to give fluids and antibiotics.  Just the results of the labs, urinalysis, and MRI.  Discussed that we will empirically treat the patient for UTI with a 5-day course of Cipro.  She should follow-up with her primary care doctor to check resolution of her UTI and recheck her kidney function within 1 week.  She should return to the ER if she experiences any persistent fevers, muscle pain, and mental status or any new or worsening symptoms.  Patient and her husband understand and agree with the plan.  All questions answered.   Final Clinical Impressions(s) / ED Diagnoses   Final diagnoses:  Altered mental status,  unspecified altered mental status type  Urinary tract infection with hematuria, site unspecified    Patient presented to the ED complaining of a 2-73-monthhistory of confusion and altered mental status that seem to have acutely worsened over the last few days.  Patient's neurologic exam showed mildly decreased strength to the bilateral lower extremities that was equal in nature and reportedly chronic since her fall last year by the patient's husband.  She had no focal neurologic deficits on exam and her MRI was negative here.  I have low suspicion for an acute intracranial pathology at this time.  Patients CBC and CMP appear to be grossly unchanged from her baseline, the exception of a blood sugar of 329.  She had no elevated anion gap.  She exhibited no signs or symptoms of DKA that required acute intervention during her stay in the ED.  TSH was negative. Patient symptoms are most likely related to a UTI given that she had moderate hemoglobin, proteinuria, and leukocytes present.  Urine culture sent.  Antibiotics and fluids given in the ED.  Patient sent home with a course of Cipro and advised close follow-up with PCP.  Return precautions given.   ED Discharge Orders        Ordered    ciprofloxacin (CIPRO) 500 MG tablet     08/24/17 1816       CRodney Booze PA-C 08/24/17 1941    MNoemi Chapel MD 08/25/17 1230

## 2017-08-24 NOTE — Discharge Instructions (Signed)
You are given a prescription for ciprofloxacin.  Please take 1 tablet every 12 hours for the next 5 days.   Please follow-up with your primary care doctor within 1 week to follow-up on the results of your urine culture and to recheck your kidney function.  Please return to the ER if you experience any changes in mental status, persistent fevers, muscle pain, or new or worsening symptoms.

## 2017-08-24 NOTE — ED Notes (Signed)
Lab at the bedside 

## 2017-08-24 NOTE — ED Triage Notes (Addendum)
PT reports she has been confused and had short-term memory difficulty around the past few months. PT states this week her memory has worsened and today her CBG at home was 500. PT states she had had recurrent UTIs but doesn't have any urinary symptoms. PT states she called her PCP for an appointment today at [facility] but was told to come to ED for eval. PT states she was supposed to come here today for an iron transfusion but didn't make it to her appointment and c/o being very tired.

## 2017-08-24 NOTE — ED Provider Notes (Signed)
Medical screening examination/treatment/procedure(s) were conducted as a shared visit with non-physician practitioner(s) and myself.  I personally evaluated the patient during the encounter.  Clinical Impression:   Final diagnoses:  None    The patient is a 66 year old female, she has a known history of diabetes as well as hypertension, hypercholesterolemia, reports that she has had some decreasing memory over the last several months, her husband is here and corroborates the story stating that she is somewhat forgetful often leaving the burner on the stove in the on position even after cooking.  The patient denies focal weakness numbness blurred vision but does state that she has some difficulty with ambulation because of balance.  This is not new and is been going on for several months.  The patient was sent here from the family doctor's office, she was not actually seen in the office but they directed her here for evaluation for possible stroke.  This seems to be subacute and is been going on for weeks if not months.  On exam the patient has a soft nontender abdomen, clear heart and lung sounds without arrhythmia tachycardia or significant edema.  The patient is obese.  Cranial nerves III through XII appear normal, she is able to follow commands with her bilateral extremities both upper and lower with no dysmetria weakness or numbness.  Gait observed:  Patient will need further workup for possible stroke, will obtain MRI and labs and urinalysis as she does have a history of UTI as well.   EKG Interpretation  Date/Time:    Ventricular Rate:    PR Interval:    QRS Duration:   QT Interval:    QTC Calculation:   R Axis:     Text Interpretation:           Noemi Chapel, MD 08/25/17 1230

## 2017-09-15 ENCOUNTER — Ambulatory Visit (HOSPITAL_COMMUNITY): Payer: Medicare Other | Admitting: Adult Health

## 2017-09-15 ENCOUNTER — Inpatient Hospital Stay (HOSPITAL_COMMUNITY): Payer: Medicare Other | Attending: Oncology

## 2017-09-15 ENCOUNTER — Encounter: Payer: Self-pay | Admitting: Gastroenterology

## 2017-09-15 ENCOUNTER — Ambulatory Visit (INDEPENDENT_AMBULATORY_CARE_PROVIDER_SITE_OTHER): Payer: Medicare Other | Admitting: Gastroenterology

## 2017-09-15 ENCOUNTER — Inpatient Hospital Stay (HOSPITAL_BASED_OUTPATIENT_CLINIC_OR_DEPARTMENT_OTHER): Payer: Medicare Other | Admitting: Adult Health

## 2017-09-15 ENCOUNTER — Other Ambulatory Visit: Payer: Self-pay

## 2017-09-15 ENCOUNTER — Encounter (HOSPITAL_COMMUNITY): Payer: Self-pay | Admitting: Adult Health

## 2017-09-15 ENCOUNTER — Other Ambulatory Visit (HOSPITAL_COMMUNITY): Payer: Medicare Other

## 2017-09-15 VITALS — BP 129/51 | HR 74 | Temp 97.9°F | Resp 18 | Ht 61.5 in | Wt 233.4 lb

## 2017-09-15 VITALS — BP 139/63 | HR 78 | Temp 97.1°F | Ht 61.0 in | Wt 231.2 lb

## 2017-09-15 DIAGNOSIS — R1319 Other dysphagia: Secondary | ICD-10-CM | POA: Diagnosis not present

## 2017-09-15 DIAGNOSIS — D509 Iron deficiency anemia, unspecified: Secondary | ICD-10-CM | POA: Diagnosis present

## 2017-09-15 DIAGNOSIS — R5383 Other fatigue: Secondary | ICD-10-CM | POA: Insufficient documentation

## 2017-09-15 DIAGNOSIS — D500 Iron deficiency anemia secondary to blood loss (chronic): Secondary | ICD-10-CM | POA: Diagnosis not present

## 2017-09-15 DIAGNOSIS — Z87891 Personal history of nicotine dependence: Secondary | ICD-10-CM | POA: Diagnosis not present

## 2017-09-15 DIAGNOSIS — D696 Thrombocytopenia, unspecified: Secondary | ICD-10-CM | POA: Insufficient documentation

## 2017-09-15 DIAGNOSIS — K7460 Unspecified cirrhosis of liver: Secondary | ICD-10-CM | POA: Diagnosis not present

## 2017-09-15 DIAGNOSIS — R609 Edema, unspecified: Secondary | ICD-10-CM

## 2017-09-15 DIAGNOSIS — G620 Drug-induced polyneuropathy: Secondary | ICD-10-CM | POA: Diagnosis not present

## 2017-09-15 LAB — CBC WITH DIFFERENTIAL/PLATELET
Basophils Absolute: 0 10*3/uL (ref 0.0–0.1)
Basophils Relative: 0 %
EOS PCT: 2 %
Eosinophils Absolute: 0.1 10*3/uL (ref 0.0–0.7)
HEMATOCRIT: 36.3 % (ref 36.0–46.0)
Hemoglobin: 11.8 g/dL — ABNORMAL LOW (ref 12.0–15.0)
LYMPHS ABS: 0.9 10*3/uL (ref 0.7–4.0)
LYMPHS PCT: 24 %
MCH: 30.7 pg (ref 26.0–34.0)
MCHC: 32.5 g/dL (ref 30.0–36.0)
MCV: 94.5 fL (ref 78.0–100.0)
MONO ABS: 0.4 10*3/uL (ref 0.1–1.0)
MONOS PCT: 9 %
Neutro Abs: 2.6 10*3/uL (ref 1.7–7.7)
Neutrophils Relative %: 65 %
Platelets: 65 10*3/uL — ABNORMAL LOW (ref 150–400)
RBC: 3.84 MIL/uL — ABNORMAL LOW (ref 3.87–5.11)
RDW: 14.4 % (ref 11.5–15.5)
WBC: 3.9 10*3/uL — ABNORMAL LOW (ref 4.0–10.5)

## 2017-09-15 LAB — COMPREHENSIVE METABOLIC PANEL
ALT: 27 U/L (ref 14–54)
ANION GAP: 11 (ref 5–15)
AST: 43 U/L — AB (ref 15–41)
Albumin: 3.4 g/dL — ABNORMAL LOW (ref 3.5–5.0)
Alkaline Phosphatase: 130 U/L — ABNORMAL HIGH (ref 38–126)
BILIRUBIN TOTAL: 0.6 mg/dL (ref 0.3–1.2)
BUN: 26 mg/dL — AB (ref 6–20)
CO2: 29 mmol/L (ref 22–32)
Calcium: 9.3 mg/dL (ref 8.9–10.3)
Chloride: 91 mmol/L — ABNORMAL LOW (ref 101–111)
Creatinine, Ser: 1.33 mg/dL — ABNORMAL HIGH (ref 0.44–1.00)
GFR, EST AFRICAN AMERICAN: 47 mL/min — AB (ref >60)
GFR, EST NON AFRICAN AMERICAN: 41 mL/min — AB (ref >60)
Glucose, Bld: 164 mg/dL — ABNORMAL HIGH (ref 65–99)
Potassium: 4.9 mmol/L (ref 3.5–5.1)
Sodium: 131 mmol/L — ABNORMAL LOW (ref 135–145)
TOTAL PROTEIN: 6.9 g/dL (ref 6.5–8.1)

## 2017-09-15 LAB — IRON AND TIBC
IRON: 75 ug/dL (ref 28–170)
Saturation Ratios: 22 % (ref 10.4–31.8)
TIBC: 337 ug/dL (ref 250–450)
UIBC: 262 ug/dL

## 2017-09-15 LAB — FERRITIN: Ferritin: 65 ng/mL (ref 11–307)

## 2017-09-15 NOTE — Progress Notes (Signed)
Referring Provider: Vesta Mixer Primary Care Physician:  [facility]: Dr. Gala Romney   Chief Complaint  Patient presents with  . Cirrhosis    f/u. Doing fine    HPI:   Penny Martinez is a 67 y.o. 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 History of chronic diarrhea, with Lomotil working well historically. History of IDA, followed by hematology. Propanolol 20 mg daily for primary bleeding prophylaxis historically, but her heart rate was not at goal at last visit in Oct. We increased this to 20 mg in the morning and 10 mg in the evening. . Needs colonoscopy in 2020. US liver due in April 2019.   In interim from last visit, she was seen in the ED Dec 2018 with altered mental status and found to have UTI. MRI Negative for stroke. She notes dysphagia for the last few months, now worsening. States solid food will sometimes be regurgitated after eating. I note that her GFR has been decreasing over past 6 months. Now 30-40 range. Creatinine slowly increasing over past few months but improved from last month. May be her new baseline. Followed by nephrologist. Most recently 1.33.   Past Medical History:  Diagnosis Date  . Acid reflux   . Anemia   . Arthritis   . C. difficile colitis June/July 2015  . Cancer ([place])    skin cancer  . CHF (congestive heart failure) ([place])   . Cirrhosis ([place])    likely due to NASH. Negative viral markers 2015  . Complication of anesthesia   . COPD (chronic obstructive pulmonary disease) ([place])   . Diabetes mellitus without complication ([place])   . GERD (gastroesophageal reflux disease)   . History of kidney stones   . Hypercholesteremia   . Hypertension   . Hypothyroidism   . IBS (irritable bowel syndrome)   . Kidney disease   . Liver disease   . Neuropathy   . PONV (postoperative nausea and vomiting)   . Sleep apnea   . Thyroid disease     Past Surgical History:  Procedure Laterality  Date  . ABDOMINAL HYSTERECTOMY    . APPENDECTOMY    . CAROTID ARTERY - SUBCLAVIAN ARTERY BYPASS GRAFT Right   . CARPAL TUNNEL RELEASE Right   . CARPAL TUNNEL RELEASE Left 12/03/2016   Procedure: CARPAL TUNNEL RELEASE;  Surgeon: Carole Civil, MD;  Location: [facility];  Service: Orthopedics;  Laterality: Left;  . CATARACT EXTRACTION Bilateral   . CHOLECYSTECTOMY    . COLONOSCOPY N/A 02/19/2014   Dr. Gala Romney: rectal varices, rectal polyp overlying a varix non-manipulated  . ESOPHAGOGASTRODUODENOSCOPY N/A 02/19/2014   Dr. Gala Romney: esophageal varices, multiple gastric polyps with largest biopsied and hyperplastic. Negative H.pylori  . GIVENS CAPSULE STUDY N/A 05/04/2014   multiple polypoid-like lesions throughout small bowel, scattered superficial non-bleeding erosions more distal bowel, referred to [facility] for enteroscopy. Capsule study not complete, poor images.   Marland Kitchen TRIGGER FINGER RELEASE Left 12/03/2016   Procedure: RELEASE TRIGGER FINGER/A-1 PULLEY LEFT LONG TRIGGER FINGER RELEASE;  Surgeon: Carole Civil, MD;  Location: [facility];  Service: Orthopedics;  Laterality: Left;  . urosepsis      Current Outpatient Medications  Medication Sig Dispense Refill  . amitriptyline (ELAVIL) 25 MG tablet Take 25 mg by mouth at bedtime.    Marland Kitchen aspirin 81 MG tablet Take 81 mg by mouth daily.    . benazepril (LOTENSIN) 20 MG tablet Take 20  mg by mouth every morning.     . Continuous Blood Gluc Sensor (FREESTYLE LIBRE SENSOR SYSTEM) MISC Use one sensor every 10 days. 3 each 2  . CRANBERRY-VITAMIN C PO Take 2 capsules by mouth at bedtime. Dosage is the 25,200 mg cranberry/vitamin C supplement    . diphenhydrAMINE (SOMINEX) 25 MG tablet Take 25 mg by mouth at bedtime. Reported on 01/31/2016    . diphenoxylate-atropine (LOMOTIL) 2.5-0.025 MG tablet 1-2 tablets every 4-6 hours as needed for diarrhea. 120 tablet 3  . ezetimibe (ZETIA) 10 MG tablet Take 10 mg by mouth every morning.     . furosemide (LASIX) 80 MG  tablet Take 80 mg by mouth every morning.     . gabapentin (NEURONTIN) 100 MG capsule TAKE (1) CAPSULE BY MOUTH THREE TIMES DAILY (Patient taking differently: TAKE (1) CAPSULE BY MOUTH IN THE MORNING AND TWO CAPSULES AT BEDTIME) 90 capsule 2  . HUMULIN R U-500 KWIKPEN 500 UNIT/ML kwikpen INJECT 130 UNITS S.Q. THREE TIMES DAILY WITH MEALS. 18 mL 0  . lansoprazole (PREVACID) 30 MG capsule Take 30 mg by mouth every morning.     Marland Kitchen levothyroxine (SYNTHROID, LEVOTHROID) 100 MCG tablet TAKE 1 TABLET BY MOUTH ONCE DAILY. 90 tablet 0  . propranolol (INDERAL) 10 MG tablet Take 2 tablets in the morning and one in the evening. (Patient taking differently: Take 10-20 mg by mouth See admin instructions. Take 2 tablets in the morning and one in the evening.) 90 tablet 3  . spironolactone (ALDACTONE) 25 MG tablet Take 25 mg by mouth daily.  5  . ULTICARE SHORT PEN NEEDLES 31G X 8 MM MISC USE AS DIRECTED THREE TIMES DAILY. 100 each 5  . VICTOZA 18 MG/3ML SOPN INJECT 1.8 MG S.Q. ONCE DAILY. (Patient taking differently: INJECT 1.8 MG ONCE DAILY.) 9 mL 2  . baclofen (LIORESAL) 10 MG tablet Take 10 mg by mouth 3 (three) times daily.     No current facility-administered medications for this visit.     Allergies as of 09/15/2017 - Review Complete 09/15/2017  Allergen Reaction Noted  . Erythromycin Hives 02/15/2014  . Niacin Rash and Shortness Of Breath 03/05/2014  . Penicillins Shortness Of Breath 02/15/2014  . Sulfa antibiotics Hives 02/15/2014  . Statins  03/05/2014  . Codeine Itching 11/26/2016    Family History  Problem Relation Age of Onset  . Diabetes Mother   . Hypertension Mother   . Kidney failure Mother   . Blindness Mother   . Aneurysm Father   . Colon cancer Neg Hx     Social History   Socioeconomic History  . Marital status: Married    Spouse name: None  . Number of children: None  . Years of education: None  . Highest education level: None  Social Needs  . Financial resource strain:  None  . Food insecurity - worry: None  . Food insecurity - inability: None  . Transportation needs - medical: None  . Transportation needs - non-medical: None  Occupational History  . None  Tobacco Use  . Smoking status: Former Smoker    Packs/day: 2.00    Years: 35.00    Pack years: 70.00    Last attempt to quit: 05/05/2003    Years since quitting: 14.3  . Smokeless tobacco: Former Systems developer  . Tobacco comment: Quit smoking 11 years ago  Substance and Sexual Activity  . Alcohol use: No  . Drug use: No  . Sexual activity: Not Currently    Birth  control/protection: Surgical  Other Topics Concern  . None  Social History Narrative  . None    Review of Systems: Gen: Denies fever, chills, anorexia. Denies fatigue, weakness, weight loss.  CV: Denies chest pain, palpitations, syncope, peripheral edema, and claudication. Resp: Denies dyspnea at rest, cough, wheezing, coughing up blood, and pleurisy. GI: see HPI  Derm: Denies rash, itching, dry skin Psych: Denies depression, anxiety, memory loss, confusion. No homicidal or suicidal ideation.  Heme: Denies bruising, bleeding, and enlarged lymph nodes.  Physical Exam: BP 139/63   Pulse 78   Temp (!) 97.1 F (36.2 C) (Oral)   Ht 5' 1"  (1.549 m)   Wt 231 lb 3.2 oz (104.9 kg)   BMI 43.68 kg/m  General:   Alert and oriented. No distress noted. Pleasant and cooperative.  Head:  Normocephalic and atraumatic. Eyes:  Conjuctiva clear without scleral icterus. Mouth:  Oral mucosa pink and moist.  Abdomen:  +BS, soft, non-tender and non-distended. No rebound or guarding. No HSM or masses noted. Msk:  Symmetrical without gross deformities. Normal posture. Extremities:  Without edema. Neurologic:  Alert and  oriented x4 Psych:  Alert and cooperative. Normal mood and affect.  Lab Results  Component Value Date   WBC 3.9 (L) 09/15/2017   HGB 11.8 (L) 09/15/2017   HCT 36.3 09/15/2017   MCV 94.5 09/15/2017   PLT 65 (L) 09/15/2017   Lab  Results  Component Value Date   ALT 27 09/15/2017   AST 43 (H) 09/15/2017   ALKPHOS 130 (H) 09/15/2017   BILITOT 0.6 09/15/2017   Lab Results  Component Value Date   CREATININE 1.33 (H) 09/15/2017   BUN 26 (H) 09/15/2017   NA 131 (L) 09/15/2017   K 4.9 09/15/2017   CL 91 (L) 09/15/2017   CO2 29 09/15/2017

## 2017-09-15 NOTE — Progress Notes (Signed)
Penny Martinez, [place] [address]   CLINIC:  Medical Oncology/Hematology  PCP:  [facility] [place] [id number] [phone]   REASON FOR VISIT:  Follow-up for Iron deficiency anemia AND Thrombocytopenia   CURRENT THERAPY: IV iron prn AND Observation      HISTORY OF PRESENT ILLNESS:  (From Dr. Laverle Patter note on 05/24/17)       INTERVAL HISTORY:  Penny Martinez 67 y.o. female returns for routine follow-up for anemia and thrombocytopenia.   Since her last visit to the cancer center, she had a ED visit on 08/24/17 for altered mental status; MRI brain was performed and was negative for any acute changes.  She was found to have UTI, as most likely source of her AMS.  She received antibiotics and her mental status improved.  She has had no residual altered mental status changes or urinary complaints since that time.  Overall, she tells me she has been feeling "so-so."  She has intermittent fatigue, currently rates her energy levels at 75%.  Appetite 100%.  She has chronic issues of leg swelling, itching, peripheral neuropathy to her hands and feet, and easy bruising.  She shares with me that she is being worked up by cardiology for carotid artery stenosis and possible aneurysm.  From a hematologic standpoint, she denies any frank bleeding episodes including blood in her stools, dark/tarry stools, hematochezia, hemoptysis, hematuria, vaginal bleeding, nosebleeds, or gingival bleeding.  She follows regularly with GI, and coincidentally has an appointment at their office today as well.  She does endorse craving ice "all of the time."    Her last colonoscopy was in 2015; she tells me that she has no intentions of having another colonoscopy performed, "because I woke up during the test and it was horrible."  Both rectal and esophageal varices were noted on her last GI evaluation with endoscopy and colonoscopy in  2015.    REVIEW OF SYSTEMS:  Review of Systems  Constitutional: Positive for fatigue. Negative for chills and fever.  HENT:  Negative.   Eyes: Negative.   Respiratory: Negative.   Cardiovascular: Positive for leg swelling.  Gastrointestinal: Negative.  Negative for blood in stool.  Endocrine: Negative.   Genitourinary: Negative.  Negative for dysuria, hematuria and vaginal bleeding.   Musculoskeletal: Negative.   Skin: Positive for itching.  Neurological: Positive for numbness.  Hematological: Bruises/bleeds easily.  Psychiatric/Behavioral: Negative.      PAST MEDICAL/SURGICAL HISTORY:  Past Medical History:  Diagnosis Date  . Acid reflux   . Anemia   . Arthritis   . C. difficile colitis June/July 2015  . Cancer ([place])    skin cancer  . CHF (congestive heart failure) ([place])   . Cirrhosis ([place])    likely due to NASH. Negative viral markers 2015  . Complication of anesthesia   . COPD (chronic obstructive pulmonary disease) ([place])   . Diabetes mellitus without complication ([place])   . GERD (gastroesophageal reflux disease)   . History of kidney stones   . Hypercholesteremia   . Hypertension   . Hypothyroidism   . IBS (irritable bowel syndrome)   . Kidney disease   . Liver disease   . Neuropathy   . PONV (postoperative nausea and vomiting)   . Sleep apnea   . Thyroid disease    Past Surgical History:  Procedure Laterality Date  . ABDOMINAL HYSTERECTOMY    . APPENDECTOMY    . CAROTID ARTERY -  SUBCLAVIAN ARTERY BYPASS GRAFT Right   . CARPAL TUNNEL RELEASE Right   . CARPAL TUNNEL RELEASE Left 12/03/2016   Procedure: CARPAL TUNNEL RELEASE;  Surgeon: Carole Civil, MD;  Location: [facility];  Service: Orthopedics;  Laterality: Left;  . CATARACT EXTRACTION Bilateral   . CHOLECYSTECTOMY    . COLONOSCOPY N/A 02/19/2014   Dr. Gala Romney: rectal varices, rectal polyp overlying a varix non-manipulated  . ESOPHAGOGASTRODUODENOSCOPY N/A 02/19/2014   Dr. Gala Romney: esophageal varices,  multiple gastric polyps with largest biopsied and hyperplastic. Negative H.pylori  . GIVENS CAPSULE STUDY N/A 05/04/2014   multiple polypoid-like lesions throughout small bowel, scattered superficial non-bleeding erosions more distal bowel, referred to [facility] for enteroscopy. Capsule study not complete, poor images.   Marland Kitchen TRIGGER FINGER RELEASE Left 12/03/2016   Procedure: RELEASE TRIGGER FINGER/A-1 PULLEY LEFT LONG TRIGGER FINGER RELEASE;  Surgeon: Carole Civil, MD;  Location: [facility];  Service: Orthopedics;  Laterality: Left;  . urosepsis       SOCIAL HISTORY:  Social History   Socioeconomic History  . Marital status: Married    Spouse name: Not on file  . Number of children: Not on file  . Years of education: Not on file  . Highest education level: Not on file  Social Needs  . Financial resource strain: Not on file  . Food insecurity - worry: Not on file  . Food insecurity - inability: Not on file  . Transportation needs - medical: Not on file  . Transportation needs - non-medical: Not on file  Occupational History  . Not on file  Tobacco Use  . Smoking status: Former Smoker    Packs/day: 2.00    Years: 35.00    Pack years: 70.00    Last attempt to quit: 05/05/2003    Years since quitting: 14.3  . Smokeless tobacco: Former Systems developer  . Tobacco comment: Quit smoking 11 years ago  Substance and Sexual Activity  . Alcohol use: No  . Drug use: No  . Sexual activity: Not Currently    Birth control/protection: Surgical  Other Topics Concern  . Not on file  Social History Narrative  . Not on file    FAMILY HISTORY:  Family History  Problem Relation Age of Onset  . Diabetes Mother   . Hypertension Mother   . Kidney failure Mother   . Blindness Mother   . Aneurysm Father   . Colon cancer Neg Hx     CURRENT MEDICATIONS:  Outpatient Encounter Medications as of 09/15/2017  Medication Sig  . amitriptyline (ELAVIL) 25 MG tablet Take 25 mg by mouth at bedtime.  Marland Kitchen aspirin 81  MG tablet Take 81 mg by mouth daily.  . baclofen (LIORESAL) 10 MG tablet Take 10 mg by mouth 3 (three) times daily.  . benazepril (LOTENSIN) 20 MG tablet Take 20 mg by mouth every morning.   . Continuous Blood Gluc Sensor (FREESTYLE LIBRE SENSOR SYSTEM) MISC Use one sensor every 10 days.  Marland Kitchen CRANBERRY-VITAMIN C PO Take 2 capsules by mouth at bedtime. Dosage is the 25,200 mg cranberry/vitamin C supplement  . diphenhydrAMINE (SOMINEX) 25 MG tablet Take 25 mg by mouth at bedtime. Reported on 01/31/2016  . diphenoxylate-atropine (LOMOTIL) 2.5-0.025 MG tablet 1-2 tablets every 4-6 hours as needed for diarrhea.  . ezetimibe (ZETIA) 10 MG tablet Take 10 mg by mouth every morning.   . furosemide (LASIX) 80 MG tablet Take 80 mg by mouth every morning.   . gabapentin (NEURONTIN) 100 MG capsule TAKE (1)  CAPSULE BY MOUTH THREE TIMES DAILY (Patient taking differently: TAKE (1) CAPSULE BY MOUTH IN THE MORNING AND TWO CAPSULES AT BEDTIME)  . HUMULIN R U-500 KWIKPEN 500 UNIT/ML kwikpen INJECT 130 UNITS S.Q. THREE TIMES DAILY WITH MEALS.  Marland Kitchen lansoprazole (PREVACID) 30 MG capsule Take 30 mg by mouth every morning.   Marland Kitchen levothyroxine (SYNTHROID, LEVOTHROID) 100 MCG tablet TAKE 1 TABLET BY MOUTH ONCE DAILY.  Marland Kitchen propranolol (INDERAL) 10 MG tablet Take 2 tablets in the morning and one in the evening. (Patient taking differently: Take 10-20 mg by mouth See admin instructions. Take 2 tablets in the morning and one in the evening.)  . spironolactone (ALDACTONE) 25 MG tablet Take 25 mg by mouth daily.  Marland Kitchen ULTICARE SHORT PEN NEEDLES 31G X 8 MM MISC USE AS DIRECTED THREE TIMES DAILY.  Marland Kitchen VICTOZA 18 MG/3ML SOPN INJECT 1.8 MG S.Q. ONCE DAILY. (Patient taking differently: INJECT 1.8 MG ONCE DAILY.)  . [DISCONTINUED] ciprofloxacin (CIPRO) 500 MG tablet Take one tablet every 12 hours for 5 days.   No facility-administered encounter medications on file as of 09/15/2017.     ALLERGIES:  Allergies  Allergen Reactions  . Erythromycin  Hives  . Niacin Rash and Shortness Of Breath  . Penicillins Shortness Of Breath    Has patient had a PCN reaction causing immediate rash, facial/tongue/throat swelling, SOB or lightheadedness with hypotension: Yes Has patient had a PCN reaction causing severe rash involving mucus membranes or skin necrosis: No Has patient had a PCN reaction that required hospitalization: No Has patient had a PCN reaction occurring within the last 10 years: No If all of the above answers are "NO", then may proceed with Cephalosporin use.   . Sulfa Antibiotics Hives  . Statins     Other reaction(s): Muscle Pain Other reaction(s): Muscle Pain  . Codeine Itching     PHYSICAL EXAM:  ECOG Performance status: 1 - Symptomatic; remains independent   Vitals:   09/15/17 1106  BP: (!) 129/51  Pulse: 74  Resp: 18  Temp: 97.9 F (36.6 C)  SpO2: 97%   Filed Weights   09/15/17 1106  Weight: 233 lb 6.4 oz (105.9 kg)    Physical Exam  Constitutional: She is oriented to person, place, and time and well-developed, well-nourished, and in no distress.  HENT:  Head: Normocephalic.  Mouth/Throat: Oropharynx is clear and moist. No oropharyngeal exudate.  Eyes: Conjunctivae are normal. Pupils are equal, round, and reactive to light. No scleral icterus.  Neck: Normal range of motion. Neck supple.  Cardiovascular: Normal rate and regular rhythm.  Pulmonary/Chest: Effort normal and breath sounds normal. No respiratory distress.  Abdominal: Soft. Bowel sounds are normal. There is no tenderness.  Musculoskeletal: Normal range of motion. She exhibits edema (Trace BLE ankle edema ).  Lymphadenopathy:    She has no cervical adenopathy.       Right: No supraclavicular adenopathy present.       Left: No supraclavicular adenopathy present.  Neurological: She is alert and oriented to person, place, and time. No cranial nerve deficit. Gait normal.  Skin: Skin is warm and dry. No rash noted.  Mild bruising noted to dorsum  of bilateral hands  Psychiatric: Mood, memory, affect and judgment normal.  Nursing note and vitals reviewed.    LABORATORY DATA:  I have reviewed the labs as listed.  CBC    Component Value Date/Time   WBC 3.9 (L) 09/15/2017 1006   RBC 3.84 (L) 09/15/2017 1006   HGB 11.8 (L)  09/15/2017 1006   HCT 36.3 09/15/2017 1006   PLT 65 (L) 09/15/2017 1006   MCV 94.5 09/15/2017 1006   MCH 30.7 09/15/2017 1006   MCHC 32.5 09/15/2017 1006   RDW 14.4 09/15/2017 1006   LYMPHSABS 0.9 09/15/2017 1006   MONOABS 0.4 09/15/2017 1006   EOSABS 0.1 09/15/2017 1006   BASOSABS 0.0 09/15/2017 1006   CMP Latest Ref Rng & Units 09/15/2017 08/24/2017 07/20/2017  Glucose 65 - 99 mg/dL 164(H) 329(H) 227(H)  BUN 6 - 20 mg/dL 26(H) 49(H) 26(H)  Creatinine 0.44 - 1.00 mg/dL 1.33(H) 1.67(H) 1.57(H)  Sodium 135 - 145 mmol/L 131(L) 134(L) 139  Potassium 3.5 - 5.1 mmol/L 4.9 4.7 5.1  Chloride 101 - 111 mmol/L 91(L) 96(L) 99  CO2 22 - 32 mmol/L 29 26 33(H)  Calcium 8.9 - 10.3 mg/dL 9.3 9.5 9.6  Total Protein 6.5 - 8.1 g/dL 6.9 7.2 -  Total Bilirubin 0.3 - 1.2 mg/dL 0.6 0.9 -  Alkaline Phos 38 - 126 U/L 130(H) 149(H) -  AST 15 - 41 U/L 43(H) 38 -  ALT 14 - 54 U/L 27 29 -   Results for Penny Martinez, Penny Martinez (MRN [id number])   Ref. Range 05/24/2017 10:46  Iron Latest Ref Range: 28 - 170 ug/dL 95  UIBC Latest Units: ug/dL 356  TIBC Latest Ref Range: 250 - 450 ug/dL 451 (H)  Saturation Ratios Latest Ref Range: 10.4 - 31.8 % 21  Ferritin Latest Ref Range: 11 - 307 ng/mL 9 (L)  Vitamin B12 Latest Ref Range: 180 - 914 pg/mL 623   PENDING LABS:    DIAGNOSTIC IMAGING:    PATHOLOGY:       ASSESSMENT & PLAN:   Iron deficiency anemia: -Thought to be related to chronic GI blood loss, with documented rectal and esophageal varices on GI evaluation in 2015; capsule study with multi-polypoid-like lesions throughout the small bowel as well as scattered superficial nonbleeding erosions.  She was intolerant to oral  iron historically. -She has received 2 doses of IV Feraheme this year.  Ferritin was 9 and TIBC 451 in 05/2017.  She tolerated these infusions well. Oncology Flowsheet 05/31/2017 06/24/2017  ferumoxytol (FERAHEME) IV 510 mg 510 mg  -Clinically, she is symptomatic with pica (ice cravings).  Denies any recent active bleeding episodes. Hgb 11.8 g/dL. Iron studies are pending.   She likely will require IV iron infusion based on her symptoms; once her iron studies have resulted in her iron deficit calculated, we will contact her to make arrangements for infusion if clinically indicated. -Labs only in 2 months to ensure stability of her iron deficiency. -Return to cancer center in 4 months for follow-up with labs.   Thrombocytopenia: -Thought to be secondary to chronic liver disease-cirrhosis/NASH.  -Plt count stable at 65,000 today. She has easy bruising, but no active bleeding. Will continue to monitor blood counts.  -Maintain follow-up with GI as directed.       Dispo:  -Labs only in 2 months.  -Return to cancer center in 4 months for follow-up with labs.    All questions were answered to patient's stated satisfaction. Encouraged patient to call with any new concerns or questions before her next visit to the cancer center and we can certain see her sooner, if needed.     Orders placed this encounter:  Orders Placed This Encounter  Procedures  . CBC with Differential/Platelet  . Comprehensive metabolic panel  . Ferritin  . Iron and TIBC  Mike Craze, NP [place] [phone]

## 2017-09-15 NOTE — Patient Instructions (Signed)
I would like for you to decrease propanolol to just the 20 milligrams each morning.  I will see you in March to talk about the upper endoscopy!

## 2017-09-17 ENCOUNTER — Other Ambulatory Visit (HOSPITAL_COMMUNITY): Payer: Self-pay | Admitting: Adult Health

## 2017-09-22 NOTE — Assessment & Plan Note (Signed)
67 year old female with history of NASH cirrhosis, due for US abdomen in March 2019. Has been on Propanolol 20 mg for variceal bleeding prophylaxis and recently increased at last visit; however, I do note that her renal function is slowly declining over time but stable now. Followed by nephrology. May need to consider coming off propanolol in the future and resuming serial EGDs. Back down to prior dosing of propanolol 20 mg daily. Keep close follow-up with nephrology. She is noting solid food dysphagia but desires to wait until March to pursue endoscopic evaluation. Last endoscopic evaluation in 2015. Will have her return in March to arrange this.

## 2017-09-22 NOTE — Assessment & Plan Note (Signed)
Query web, ring, stricture. Doubt malignancy. Declining endoscopic evaluation now but wants to arrange at next visit in March 2019. Return for close follow-up.

## 2017-09-23 ENCOUNTER — Encounter (HOSPITAL_COMMUNITY): Payer: Self-pay

## 2017-09-23 ENCOUNTER — Other Ambulatory Visit: Payer: Self-pay | Admitting: Gastroenterology

## 2017-09-23 ENCOUNTER — Inpatient Hospital Stay (HOSPITAL_COMMUNITY): Payer: Medicare Other

## 2017-09-23 ENCOUNTER — Other Ambulatory Visit: Payer: Self-pay | Admitting: "Endocrinology

## 2017-09-23 ENCOUNTER — Other Ambulatory Visit: Payer: Self-pay

## 2017-09-23 VITALS — BP 148/50 | HR 77 | Temp 98.0°F | Resp 20

## 2017-09-23 DIAGNOSIS — D509 Iron deficiency anemia, unspecified: Secondary | ICD-10-CM | POA: Diagnosis not present

## 2017-09-23 DIAGNOSIS — D500 Iron deficiency anemia secondary to blood loss (chronic): Secondary | ICD-10-CM

## 2017-09-23 MED ORDER — SODIUM CHLORIDE 0.9 % IV SOLN
INTRAVENOUS | Status: DC
  Administered 2017-09-23: 14:00:00 via INTRAVENOUS

## 2017-09-23 MED ORDER — SODIUM CHLORIDE 0.9 % IV SOLN
510.0000 mg | Freq: Once | INTRAVENOUS | Status: AC
  Administered 2017-09-23: 510 mg via INTRAVENOUS
  Filled 2017-09-23: qty 17

## 2017-09-23 NOTE — Progress Notes (Signed)
CC'D TO PCP °

## 2017-09-23 NOTE — Progress Notes (Signed)
Tolerated infusion w/o adverse reaction.  Alert, in no distress.  VSS.  Discharged ambulatory.  

## 2017-10-04 ENCOUNTER — Other Ambulatory Visit: Payer: Self-pay | Admitting: "Endocrinology

## 2017-10-06 ENCOUNTER — Other Ambulatory Visit: Payer: Self-pay | Admitting: "Endocrinology

## 2017-11-02 ENCOUNTER — Emergency Department (HOSPITAL_COMMUNITY)
Admission: EM | Admit: 2017-11-02 | Discharge: 2017-11-02 | Disposition: A | Discharge status: Home / Self Care | Payer: Medicare Other | Attending: Emergency Medicine | Admitting: Emergency Medicine

## 2017-11-02 ENCOUNTER — Encounter (HOSPITAL_COMMUNITY): Payer: Self-pay

## 2017-11-02 ENCOUNTER — Emergency Department (HOSPITAL_COMMUNITY): Payer: Medicare Other

## 2017-11-02 DIAGNOSIS — J449 Chronic obstructive pulmonary disease, unspecified: Secondary | ICD-10-CM | POA: Diagnosis not present

## 2017-11-02 DIAGNOSIS — E039 Hypothyroidism, unspecified: Secondary | ICD-10-CM | POA: Diagnosis not present

## 2017-11-02 DIAGNOSIS — I509 Heart failure, unspecified: Secondary | ICD-10-CM | POA: Diagnosis not present

## 2017-11-02 DIAGNOSIS — E1165 Type 2 diabetes mellitus with hyperglycemia: Secondary | ICD-10-CM | POA: Diagnosis not present

## 2017-11-02 DIAGNOSIS — I130 Hypertensive heart and chronic kidney disease with heart failure and stage 1 through stage 4 chronic kidney disease, or unspecified chronic kidney disease: Secondary | ICD-10-CM | POA: Diagnosis not present

## 2017-11-02 DIAGNOSIS — Z85828 Personal history of other malignant neoplasm of skin: Secondary | ICD-10-CM | POA: Insufficient documentation

## 2017-11-02 DIAGNOSIS — N183 Chronic kidney disease, stage 3 (moderate): Secondary | ICD-10-CM | POA: Insufficient documentation

## 2017-11-02 DIAGNOSIS — R739 Hyperglycemia, unspecified: Secondary | ICD-10-CM

## 2017-11-02 DIAGNOSIS — Z87891 Personal history of nicotine dependence: Secondary | ICD-10-CM | POA: Insufficient documentation

## 2017-11-02 DIAGNOSIS — E1122 Type 2 diabetes mellitus with diabetic chronic kidney disease: Secondary | ICD-10-CM | POA: Diagnosis not present

## 2017-11-02 LAB — CBC WITH DIFFERENTIAL/PLATELET
BASOS ABS: 0 10*3/uL (ref 0.0–0.1)
BASOS PCT: 0 %
Eosinophils Absolute: 0.1 10*3/uL (ref 0.0–0.7)
Eosinophils Relative: 3 %
HEMATOCRIT: 36.1 % (ref 36.0–46.0)
HEMOGLOBIN: 12 g/dL (ref 12.0–15.0)
Lymphocytes Relative: 30 %
Lymphs Abs: 1 10*3/uL (ref 0.7–4.0)
MCH: 31.6 pg (ref 26.0–34.0)
MCHC: 33.2 g/dL (ref 30.0–36.0)
MCV: 95 fL (ref 78.0–100.0)
Monocytes Absolute: 0.2 10*3/uL (ref 0.1–1.0)
Monocytes Relative: 6 %
NEUTROS ABS: 2.1 10*3/uL (ref 1.7–7.7)
NEUTROS PCT: 61 %
Platelets: 53 10*3/uL — ABNORMAL LOW (ref 150–400)
RBC: 3.8 MIL/uL — AB (ref 3.87–5.11)
RDW: 13.8 % (ref 11.5–15.5)
WBC: 3.4 10*3/uL — AB (ref 4.0–10.5)

## 2017-11-02 LAB — URINALYSIS, ROUTINE W REFLEX MICROSCOPIC
Bacteria, UA: NONE SEEN
Bilirubin Urine: NEGATIVE
Glucose, UA: >=500 mg/dL — AB
Hgb urine dipstick: NEGATIVE
Ketones, ur: NEGATIVE mg/dL
Leukocytes, UA: NEGATIVE
Nitrite: NEGATIVE
PROTEIN: NEGATIVE mg/dL
RBC / HPF: NONE SEEN RBC/hpf (ref 0–5)
SQUAMOUS EPITHELIAL / LPF: NONE SEEN
Specific Gravity, Urine: 1.012 (ref 1.005–1.030)
WBC UA: NONE SEEN WBC/hpf (ref 0–5)
pH: 5 (ref 5.0–8.0)

## 2017-11-02 LAB — COMPREHENSIVE METABOLIC PANEL
ALBUMIN: 3.7 g/dL (ref 3.5–5.0)
ALT: 25 U/L (ref 14–54)
AST: 45 U/L — AB (ref 15–41)
Alkaline Phosphatase: 89 U/L (ref 38–126)
Anion gap: 14 (ref 5–15)
BILIRUBIN TOTAL: 1.5 mg/dL — AB (ref 0.3–1.2)
BUN: 36 mg/dL — AB (ref 6–20)
CO2: 25 mmol/L (ref 22–32)
Calcium: 9.1 mg/dL (ref 8.9–10.3)
Chloride: 94 mmol/L — ABNORMAL LOW (ref 101–111)
Creatinine, Ser: 1.37 mg/dL — ABNORMAL HIGH (ref 0.44–1.00)
GFR calc Af Amer: 45 mL/min — ABNORMAL LOW (ref >60)
GFR calc non Af Amer: 39 mL/min — ABNORMAL LOW (ref >60)
GLUCOSE: 527 mg/dL — AB (ref 65–99)
POTASSIUM: 4.4 mmol/L (ref 3.5–5.1)
Sodium: 133 mmol/L — ABNORMAL LOW (ref 135–145)
TOTAL PROTEIN: 7.3 g/dL (ref 6.5–8.1)

## 2017-11-02 LAB — CBG MONITORING, ED
GLUCOSE-CAPILLARY: 469 mg/dL — AB (ref 65–99)
Glucose-Capillary: 419 mg/dL — ABNORMAL HIGH (ref 65–99)
Glucose-Capillary: 391 mg/dL — ABNORMAL HIGH (ref 65–99)

## 2017-11-02 MED ORDER — SODIUM CHLORIDE 0.9 % IV BOLUS (SEPSIS)
500.0000 mL | Freq: Once | INTRAVENOUS | Status: AC
  Administered 2017-11-02: 500 mL via INTRAVENOUS

## 2017-11-02 MED ORDER — INSULIN ASPART 100 UNIT/ML ~~LOC~~ SOLN
10.0000 [IU] | Freq: Once | SUBCUTANEOUS | Status: AC
  Administered 2017-11-02: 10 [IU] via INTRAVENOUS
  Filled 2017-11-02: qty 1

## 2017-11-02 NOTE — ED Provider Notes (Signed)
Emergency Department Provider Note   I have reviewed the triage vital signs and the nursing notes.   HISTORY  Chief Complaint Hyperglycemia   HPI Penny Martinez is a 67 y.o. female with PMH of CHF, COPD, DM, GERD, HLD, elevated BMI, dementia (per husband) and hypothyroidism presents to the emergency department for evaluation of elevated blood sugar at home with increased confusion.  The states that she is been taking her Humalog as directed but her blood sugar meter is reading "high."  The patient's husband at bedside states that he is unsure if she is been taking insulin at all and that she has been more confused than normal over the past several days.  States that she was evaluated yesterday by the PCP for ongoing sinus infection type symptoms and started on an unknown antibiotic.  He denies any vomiting or diarrhea.  Patient's husband states he is not keeping track of it she takes her insulin or not.   The patient denies any specific pain.  States she occasionally has burning with urination but cannot tell me when exactly this started.   Level 5 caveat: Underlying memory problems.    Past Medical History:  Diagnosis Date  . Acid reflux   . Anemia   . Arthritis   . C. difficile colitis June/July 2015  . Cancer ([place])    skin cancer  . CHF (congestive heart failure) ([place])   . 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 Negative viral markers 2015  . Complication of anesthesia   . COPD (chronic obstructive pulmonary disease) ([place])   . Diabetes mellitus without complication ([place])   . GERD (gastroesophageal reflux disease)   . History of kidney stones   . Hypercholesteremia   . Hypertension   . Hypothyroidism   . IBS (irritable bowel syndrome)   . Kidney disease   . Liver disease   . Neuropathy   . PONV (postoperative nausea and vomiting)   . Sleep apnea   . Thyroid disease     Patient Active Problem List   Diagnosis Date Noted  . Other dysphagia 09/15/2017  .  Uncontrolled type 2 diabetes mellitus with complication ([place]) 00/86/7619  . Mixed hyperlipidemia 04/27/2017  . Hyponatremia 04/27/2017  . Chronic diarrhea 12/15/2016  . Carpal tunnel syndrome, left   . Trigger middle finger of left hand   . Thrombocytopenia ([place]) 11/04/2016  . Iron deficiency anemia due to chronic blood loss 01/31/2016  . Other specified hypothyroidism 09/20/2015  . Essential hypertension, benign 07/12/2015  . Morbid obesity with BMI of 40.0-44.9, adult ([place]) 07/12/2015  . Hepatic cirrhosis ([place]) 04/02/2014  . Acute colitis 03/05/2014  . Abdominal pain, acute, left lower quadrant 03/05/2014  . Colitis 03/05/2014  . Splenomegaly, congestive, chronic 02/17/2014  . Sepsis ([place]) 02/15/2014  . Absolute anemia 02/15/2014  . Diabetes mellitus with stage 3 chronic kidney disease ([place]) 02/15/2014  . CHF (congestive heart failure) ([place]) 02/15/2014  . CKD (chronic kidney disease) 02/15/2014  . UTI (lower urinary tract infection) 02/15/2014    Past Surgical History:  Procedure Laterality Date  . ABDOMINAL HYSTERECTOMY    . APPENDECTOMY    . CAROTID ARTERY - SUBCLAVIAN ARTERY BYPASS GRAFT Right   . CARPAL TUNNEL RELEASE Right   . CARPAL TUNNEL RELEASE Left 12/03/2016   Procedure: CARPAL TUNNEL RELEASE;  Surgeon: Carole Civil, MD;  Location: [facility];  Service: Orthopedics;  Laterality: Left;  . CATARACT EXTRACTION Bilateral   . CHOLECYSTECTOMY    .  COLONOSCOPY N/A 02/19/2014   Dr. Gala Romney: rectal varices, rectal polyp overlying a varix non-manipulated  . ESOPHAGOGASTRODUODENOSCOPY N/A 02/19/2014   Dr. Gala Romney: esophageal varices, multiple gastric polyps with largest biopsied and hyperplastic. Negative H.pylori  . GIVENS CAPSULE STUDY N/A 05/04/2014   multiple polypoid-like lesions throughout small bowel, scattered superficial non-bleeding erosions more distal bowel, referred to [facility] for enteroscopy. Capsule study not complete, poor images.   Marland Kitchen TRIGGER FINGER RELEASE Left  12/03/2016   Procedure: RELEASE TRIGGER FINGER/A-1 PULLEY LEFT LONG TRIGGER FINGER RELEASE;  Surgeon: Carole Civil, MD;  Location: [facility];  Service: Orthopedics;  Laterality: Left;  . urosepsis      Current Outpatient Rx  . Order #: [id number] Class: Historical Med  . Order #: [id number] Class: Historical Med  . Order #: [id number] Class: Historical Med  . Order #: [id number] Class: Historical Med  . Order #: [id number] Class: Historical Med  . Order #: [id number] Class: Print  . Order #: [id number] Class: Historical Med  . Order #: [id number] Class: Historical Med  . Order #: [id number] Class: Historical Med  . Order #: [id number] Class: Historical Med  . Order #: [id number] Class: Normal  . Order #: [id number] Class: Normal  . Order #: [id number] Class: Historical Med  . Order #: [id number] Class: Normal  . Order #: [id number] Class: Normal  . Order #: [id number] Class: Historical Med  . Order #: [id number] Class: Normal  . Order #: [id number] Class: Normal  . Order #: [id number] Class: Normal    Allergies Erythromycin; Niacin; Penicillins; Sulfa antibiotics; Statins; and Codeine  Family History  Problem Relation Age of Onset  . Diabetes Mother   . Hypertension Mother   . Kidney failure Mother   . Blindness Mother   . Aneurysm Father   . Colon cancer Neg Hx     Social History Social History   Tobacco Use  . Smoking status: Former Smoker    Packs/day: 2.00    Years: 35.00    Pack years: 70.00    Last attempt to quit: 05/05/2003    Years since quitting: 14.5  . Smokeless tobacco: Former Systems developer  . Tobacco comment: Quit smoking 11 years ago  Substance Use Topics  . Alcohol use: No  . Drug use: No    Review of Systems  Level 5 caveat: Memory problems.   ____________________________________________   PHYSICAL EXAM:  VITAL SIGNS: ED Triage Vitals  Enc Vitals Group     BP 11/02/17 1731 (!) 142/103     Pulse Rate 11/02/17 1731 90     Resp 11/02/17 1731 20     Temp 11/02/17 1731 99 F  (37.2 C)     Temp Source 11/02/17 1731 Oral     SpO2 11/02/17 1731 92 %     Weight 11/02/17 1722 231 lb (104.8 kg)     Height 11/02/17 1722 5' 4"  (1.626 m)   Constitutional: Alert but confused and forgetful. Well appearing and in no acute distress. Eyes: Conjunctivae are normal.  Head: Atraumatic. Nose: No congestion/rhinnorhea. Mouth/Throat: Mucous membranes are slightly dry.  Neck: No stridor.  Cardiovascular: Normal rate, regular rhythm. Good peripheral circulation. Grossly normal heart sounds.   Respiratory: Normal respiratory effort.  No retractions. Lungs CTAB. Gastrointestinal: Soft and nontender. No distention.  Musculoskeletal: No lower extremity tenderness nor edema. No gross deformities of extremities. Neurologic:  Normal speech and language. No gross focal neurologic deficits are appreciated. Normal CN exam. Normal strength and sensation in upper and lower extremities.  Skin:  Skin is warm, dry and intact. No rash noted.  ____________________________________________   Reva Bores (  all labs ordered are listed, but only abnormal results are displayed)  Labs Reviewed  CBC WITH DIFFERENTIAL/PLATELET - Abnormal; Notable for the following components:      Result Value   WBC 3.4 (*)    RBC 3.80 (*)    Platelets 53 (*)    All other components within normal limits  COMPREHENSIVE METABOLIC PANEL - Abnormal; Notable for the following components:   Sodium 133 (*)    Chloride 94 (*)    Glucose, Bld 527 (*)    BUN 36 (*)    Creatinine, Ser 1.37 (*)    AST 45 (*)    Total Bilirubin 1.5 (*)    GFR calc non Af Amer 39 (*)    GFR calc Af Amer 45 (*)    All other components within normal limits  URINALYSIS, ROUTINE W REFLEX MICROSCOPIC - Abnormal; Notable for the following components:   APPearance HAZY (*)    Glucose, UA >=500 (*)    All other components within normal limits  CBG MONITORING, ED - Abnormal; Notable for the following components:   Glucose-Capillary 469 (*)    All  other components within normal limits  CBG MONITORING, ED - Abnormal; Notable for the following components:   Glucose-Capillary 419 (*)    All other components within normal limits  CBG MONITORING, ED - Abnormal; Notable for the following components:   Glucose-Capillary 391 (*)    All other components within normal limits   ____________________________________________  RADIOLOGY  Dg Chest 2 View  Result Date: 11/02/2017 CLINICAL DATA:  Weakness and cough. EXAM: CHEST  2 VIEW COMPARISON:  Chest x-ray dated March 05, 2014. FINDINGS: The heart size and mediastinal contours are within normal limits. Normal pulmonary vascularity. Streaky atelectasis in the medial left lower lobe. No focal consolidation, pleural effusion, or pneumothorax. No acute osseous abnormality. IMPRESSION: No active cardiopulmonary disease. Electronically Signed   By: Titus Dubin M.D.   On: 11/02/2017 19:30    ____________________________________________   PROCEDURES  Procedure(s) performed:   Procedures  None ____________________________________________   INITIAL IMPRESSION / ASSESSMENT AND PLAN / ED COURSE  Pertinent labs & imaging results that were available during my care of the patient were reviewed by me and considered in my medical decision making (see chart for details).  Patient presents to the emergency department for evaluation of hyperglycemia.  She has baseline confusion but husband states she is been a little bit more confused.  Smells faintly of urine.  Plan for UA, labs to rule out DKA, S.  The patient was given IV fluids in route with EMS.   Patient glucose is down-trending in the ED. No changes made to insulin but will have patient/husband call Endocrinology for med adjustments/follow-up tomorrow morning. Husband seems to be having trouble with managing the patent at home and verbalizes concerns with the number of meds she is on and in his ability to assit her. I have placed a case  management consult and Face-to-Face order for home health. Advised that they should be called tomorrow at home to discuss establishing some home health. Additional labs reviewed. No UTI. CXR reviewed with no PNA. Husband states that mental status seems much improved when compared to earlier.   At this time, I do not feel there is any life-threatening condition present. I have reviewed and discussed all results (EKG, imaging, lab, urine as appropriate), exam findings with patient. I have reviewed nursing notes and appropriate previous records.  I feel the patient is safe  to be discharged home without further emergent workup. Discussed usual and customary return precautions. Patient and family (if present) verbalize understanding and are comfortable with this plan.  Patient will follow-up with their primary care provider. If they do not have a primary care provider, information for follow-up has been provided to them. All questions have been answered.  ____________________________________________  FINAL CLINICAL IMPRESSION(S) / ED DIAGNOSES  Final diagnoses:  Hyperglycemia     MEDICATIONS GIVEN DURING THIS VISIT:  Medications  insulin aspart (novoLOG) injection 10 Units (10 Units Intravenous Given 11/02/17 1937)  sodium chloride 0.9 % bolus 500 mL (0 mLs Intravenous Stopped 11/02/17 2057)  sodium chloride 0.9 % bolus 500 mL (0 mLs Intravenous Stopped 11/02/17 2315)    Note:  This document was prepared using Dragon voice recognition software and may include unintentional dictation errors.  Nanda Quinton, MD Emergency Medicine    Long, [facility], MD 11/03/17 [phone]

## 2017-11-02 NOTE — ED Triage Notes (Signed)
EMS reports pt's home glucose monitor was reading "high"  EMS administered 522m  Bolus NSS.   Reports history of dementia.  EMS says husband says it doesn't look like pt has been taking her insulin.  Pt on antibiotics currently for respiratory infection.

## 2017-11-02 NOTE — ED Notes (Signed)
CRITICAL VALUE ALERT  Critical Value:  Glucose 527   Date & Time Notied:  11/02/16 1925  Provider Notified: Notified Dr. Laverta Baltimore  Orders Received/Actions taken: Notified Dr. Laverta Baltimore

## 2017-11-02 NOTE — Discharge Instructions (Signed)
As we discussed, though your blood sugar is running high, it is not dangerous at this time.  Making adjustments in the Emergency Department (ED) and possibly causing your glucose level to drop too low is more dangerous than continuing your current medications at this time until you can follow up with your clinic doctor.  Please continue your medications and follow up with your regular doctor as recommended in these documents.  If you develop new or worsening symptoms that concern you, please return to the Emergency Department.  I have scheduled a call with home health who should call you tomorrow to arrange an at-home visit to see how they can help.

## 2017-11-03 ENCOUNTER — Telehealth: Payer: Self-pay

## 2017-11-03 NOTE — Care Management (Addendum)
CM received call for [facility]. Patient not in ER currently. Discharged home from ER last night. Patient lives in [place]. Santiago Glad of [employer] notified and will obtain orders from Epic and call family.

## 2017-11-04 ENCOUNTER — Other Ambulatory Visit (HOSPITAL_COMMUNITY)
Admission: RE | Admit: 2017-11-04 | Discharge: 2017-11-04 | Disposition: A | Discharge status: Home / Self Care | Payer: Medicare Other | Source: Ambulatory Visit | Attending: "Endocrinology | Admitting: "Endocrinology

## 2017-11-04 DIAGNOSIS — E1122 Type 2 diabetes mellitus with diabetic chronic kidney disease: Secondary | ICD-10-CM | POA: Diagnosis present

## 2017-11-04 DIAGNOSIS — E038 Other specified hypothyroidism: Secondary | ICD-10-CM | POA: Insufficient documentation

## 2017-11-04 DIAGNOSIS — N183 Chronic kidney disease, stage 3 (moderate): Secondary | ICD-10-CM | POA: Diagnosis present

## 2017-11-04 LAB — COMPREHENSIVE METABOLIC PANEL
ALK PHOS: 75 U/L (ref 38–126)
ALT: 25 U/L (ref 14–54)
ANION GAP: 12 (ref 5–15)
AST: 47 U/L — ABNORMAL HIGH (ref 15–41)
Albumin: 3.6 g/dL (ref 3.5–5.0)
BILIRUBIN TOTAL: 0.7 mg/dL (ref 0.3–1.2)
BUN: 34 mg/dL — ABNORMAL HIGH (ref 6–20)
CALCIUM: 9.1 mg/dL (ref 8.9–10.3)
CO2: 26 mmol/L (ref 22–32)
Chloride: 99 mmol/L — ABNORMAL LOW (ref 101–111)
Creatinine, Ser: 1.38 mg/dL — ABNORMAL HIGH (ref 0.44–1.00)
GFR calc non Af Amer: 39 mL/min — ABNORMAL LOW (ref >60)
GFR, EST AFRICAN AMERICAN: 45 mL/min — AB (ref >60)
Glucose, Bld: 170 mg/dL — ABNORMAL HIGH (ref 65–99)
POTASSIUM: 4.6 mmol/L (ref 3.5–5.1)
Sodium: 137 mmol/L (ref 135–145)
TOTAL PROTEIN: 7.1 g/dL (ref 6.5–8.1)

## 2017-11-04 LAB — RENAL FUNCTION PANEL
ALBUMIN: 3.6 g/dL (ref 3.5–5.0)
Anion gap: 12 (ref 5–15)
BUN: 35 mg/dL — AB (ref 6–20)
CALCIUM: 9.2 mg/dL (ref 8.9–10.3)
CO2: 26 mmol/L (ref 22–32)
CREATININE: 1.33 mg/dL — AB (ref 0.44–1.00)
Chloride: 99 mmol/L — ABNORMAL LOW (ref 101–111)
GFR, EST AFRICAN AMERICAN: 47 mL/min — AB (ref >60)
GFR, EST NON AFRICAN AMERICAN: 41 mL/min — AB (ref >60)
Glucose, Bld: 170 mg/dL — ABNORMAL HIGH (ref 65–99)
PHOSPHORUS: 3.4 mg/dL (ref 2.5–4.6)
Potassium: 4.5 mmol/L (ref 3.5–5.1)
Sodium: 137 mmol/L (ref 135–145)

## 2017-11-04 LAB — T4, FREE: FREE T4: 1.11 ng/dL (ref 0.61–1.12)

## 2017-11-04 LAB — HEMOGLOBIN A1C
HEMOGLOBIN A1C: 8.2 % — AB (ref 4.8–5.6)
Mean Plasma Glucose: 188.64 mg/dL

## 2017-11-04 LAB — TSH: TSH: 2.741 u[IU]/mL (ref 0.350–4.500)

## 2017-11-04 NOTE — Telephone Encounter (Signed)
error 

## 2017-11-11 ENCOUNTER — Other Ambulatory Visit: Payer: Self-pay | Admitting: "Endocrinology

## 2017-11-11 ENCOUNTER — Ambulatory Visit (INDEPENDENT_AMBULATORY_CARE_PROVIDER_SITE_OTHER): Payer: Medicare Other | Admitting: "Endocrinology

## 2017-11-11 ENCOUNTER — Ambulatory Visit: Payer: Medicare Other | Admitting: Nutrition

## 2017-11-11 ENCOUNTER — Ambulatory Visit: Payer: Medicare Other | Admitting: Gastroenterology

## 2017-11-11 ENCOUNTER — Encounter: Payer: Self-pay | Admitting: "Endocrinology

## 2017-11-11 VITALS — BP 127/79 | HR 81 | Ht 61.0 in | Wt 223.0 lb

## 2017-11-11 DIAGNOSIS — E782 Mixed hyperlipidemia: Secondary | ICD-10-CM

## 2017-11-11 DIAGNOSIS — E038 Other specified hypothyroidism: Secondary | ICD-10-CM | POA: Diagnosis not present

## 2017-11-11 DIAGNOSIS — N183 Chronic kidney disease, stage 3 unspecified: Secondary | ICD-10-CM

## 2017-11-11 DIAGNOSIS — Z6841 Body Mass Index (BMI) 40.0 and over, adult: Secondary | ICD-10-CM

## 2017-11-11 DIAGNOSIS — E1122 Type 2 diabetes mellitus with diabetic chronic kidney disease: Secondary | ICD-10-CM | POA: Diagnosis not present

## 2017-11-11 MED ORDER — INSULIN REGULAR HUMAN (CONC) 500 UNIT/ML ~~LOC~~ SOPN: 80.0000 [IU] | PEN_INJECTOR | Freq: Three times a day (TID) | SUBCUTANEOUS | 2 refills | Status: DC

## 2017-11-11 NOTE — Progress Notes (Signed)
Subjective:    Patient ID: Penny Martinez, female    DOB: 1951/03/11,    Past Medical History:  Diagnosis Date  . Acid reflux   . Anemia   . Arthritis   . C. difficile colitis June/July 2015  . Cancer ([place])    skin cancer  . CHF (congestive heart failure) ([place])   . 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 Negative viral markers 2015  . Complication of anesthesia   . COPD (chronic obstructive pulmonary disease) ([place])   . Diabetes mellitus without complication ([place])   . GERD (gastroesophageal reflux disease)   . History of kidney stones   . Hypercholesteremia   . Hypertension   . Hypothyroidism   . IBS (irritable bowel syndrome)   . Kidney disease   . Liver disease   . Neuropathy   . PONV (postoperative nausea and vomiting)   . Sleep apnea   . Thyroid disease    Past Surgical History:  Procedure Laterality Date  . ABDOMINAL HYSTERECTOMY    . APPENDECTOMY    . CAROTID ARTERY - SUBCLAVIAN ARTERY BYPASS GRAFT Right   . CARPAL TUNNEL RELEASE Right   . CARPAL TUNNEL RELEASE Left 12/03/2016   Procedure: CARPAL TUNNEL RELEASE;  Surgeon: Carole Civil, MD;  Location: [facility];  Service: Orthopedics;  Laterality: Left;  . CATARACT EXTRACTION Bilateral   . CHOLECYSTECTOMY    . COLONOSCOPY N/A 02/19/2014   Dr. Gala Romney: rectal varices, rectal polyp overlying a varix non-manipulated  . ESOPHAGOGASTRODUODENOSCOPY N/A 02/19/2014   Dr. Gala Romney: esophageal varices, multiple gastric polyps with largest biopsied and hyperplastic. Negative H.pylori  . GIVENS CAPSULE STUDY N/A 05/04/2014   multiple polypoid-like lesions throughout small bowel, scattered superficial non-bleeding erosions more distal bowel, referred to [facility] for enteroscopy. Capsule study not complete, poor images.   Marland Kitchen TRIGGER FINGER RELEASE Left 12/03/2016   Procedure: RELEASE TRIGGER FINGER/A-1 PULLEY LEFT LONG TRIGGER FINGER RELEASE;  Surgeon: Carole Civil, MD;  Location: [facility];  Service: Orthopedics;  Laterality:  Left;  . urosepsis     Social History   Socioeconomic History  . Marital status: Married    Spouse name: None  . Number of children: None  . Years of education: None  . Highest education level: None  Social Needs  . Financial resource strain: None  . Food insecurity - worry: None  . Food insecurity - inability: None  . Transportation needs - medical: None  . Transportation needs - non-medical: None  Occupational History  . None  Tobacco Use  . Smoking status: Former Smoker    Packs/day: 2.00    Years: 35.00    Pack years: 70.00    Last attempt to quit: 05/05/2003    Years since quitting: 14.5  . Smokeless tobacco: Former Systems developer  . Tobacco comment: Quit smoking 11 years ago  Substance and Sexual Activity  . Alcohol use: No  . Drug use: No  . Sexual activity: Not Currently    Birth control/protection: Surgical  Other Topics Concern  . None  Social History Narrative  . None   Outpatient Encounter Medications as of 11/11/2017  Medication Sig  . amitriptyline (ELAVIL) 25 MG tablet Take 25 mg by mouth at bedtime.  Marland Kitchen aspirin 81 MG tablet Take 81 mg by mouth every evening.   . benazepril (LOTENSIN) 20 MG tablet Take 20 mg by mouth every morning.   . Continuous Blood Gluc Sensor (FREESTYLE LIBRE SENSOR SYSTEM) MISC Use one  sensor every 10 days.  Marland Kitchen CRANBERRY-VITAMIN C PO Take 2 capsules by mouth at bedtime. Dosage is the 25,200 mg cranberry/vitamin C supplement  . diphenhydrAMINE (SOMINEX) 25 MG tablet Take 25 mg by mouth at bedtime. Reported on 01/31/2016  . diphenoxylate-atropine (LOMOTIL) 2.5-0.025 MG tablet 1-2 tablets every 4-6 hours as needed for diarrhea. (Patient taking differently: Take 1-2 tablets by mouth every 4 (four) hours as needed for diarrhea or loose stools. )  . ezetimibe (ZETIA) 10 MG tablet Take 10 mg by mouth every morning.   . fluticasone (FLONASE) 50 MCG/ACT nasal spray Place 2 sprays into both nostrils daily.  . furosemide (LASIX) 80 MG tablet Take 80 mg by  mouth every morning.   . gabapentin (NEURONTIN) 100 MG capsule TAKE (1) CAPSULE BY MOUTH THREE TIMES DAILY  . insulin regular human CONCENTRATED (HUMULIN R U-500 KWIKPEN) 500 UNIT/ML kwikpen Inject 80 Units into the skin 3 (three) times daily with meals.  . lansoprazole (PREVACID) 30 MG capsule Take 30 mg by mouth every morning.   Marland Kitchen levothyroxine (SYNTHROID, LEVOTHROID) 100 MCG tablet TAKE 1 TABLET BY MOUTH ONCE DAILY.  Marland Kitchen propranolol (INDERAL) 10 MG tablet TAKE 2 TABLETS BY MOUTH IN THE MORNING AND TAKE ONE TABLET IN THE EVENING (Patient taking differently: Take 20 mg by mouth every morning. )  . spironolactone (ALDACTONE) 25 MG tablet Take 25 mg by mouth daily.  Marland Kitchen ULTICARE SHORT PEN NEEDLES 31G X 8 MM MISC USE AS DIRECTED THREE TIMES DAILY.  Marland Kitchen VICTOZA 18 MG/3ML SOPN INJECT 1.8 MG S.Q. ONCE DAILY. (Patient taking differently: INJECT 1.8 MG SUBCUTANEOUSLY ONCE DAILY.)  . [DISCONTINUED] doxycycline (VIBRAMYCIN) 100 MG capsule Take 100 mg by mouth 2 (two) times daily. [place] ON 11/01/2017  . [DISCONTINUED] HUMULIN R U-500 KWIKPEN 500 UNIT/ML kwikpen INJECT 130 UNITS S.Q. THREE TIMES DAILY WITH MEALS. (Patient taking differently: INJECT 130 UNITS SUBCUTANEOUSLY THREE TIMES DAILY WITH MEALS.)   No facility-administered encounter medications on file as of 11/11/2017.    ALLERGIES: Allergies  Allergen Reactions  . Erythromycin Hives  . Niacin Rash and Shortness Of Breath  . Penicillins Shortness Of Breath    Has patient had a PCN reaction causing immediate rash, facial/tongue/throat swelling, SOB or lightheadedness with hypotension: Yes Has patient had a PCN reaction causing severe rash involving mucus membranes or skin necrosis: No Has patient had a PCN reaction that required hospitalization: No Has patient had a PCN reaction occurring within the last 10 years: No If all of the above answers are "NO", then may proceed with Cephalosporin use.   . Sulfa Antibiotics Hives  . Statins      Other reaction(s): Muscle Pain Other reaction(s): Muscle Pain  . Codeine Itching   VACCINATION STATUS: Immunization History  Administered Date(s) Administered  . Influenza,inj,Quad PF,6+ Mos 07/24/2016, 06/24/2017    Diabetes  She presents for her follow-up diabetic visit. She has type 2 diabetes mellitus. Onset time: She was diagnosed at approximate age of 64 years. Her disease course has been worsening. There are no hypoglycemic associated symptoms. Pertinent negatives for hypoglycemia include no confusion, headaches, pallor or seizures. Associated symptoms include fatigue. Pertinent negatives for diabetes include no chest pain, no polydipsia, no polyphagia and no polyuria. There are no hypoglycemic complications. Symptoms are worsening. Diabetic complications include heart disease, nephropathy and PVD. Risk factors for coronary artery disease include diabetes mellitus, dyslipidemia, obesity, hypertension, sedentary lifestyle and tobacco exposure. Current diabetic treatment includes intensive insulin program. She is compliant with treatment  most of the time. Her weight is decreasing steadily. She is following a generally unhealthy diet. Prior visit with dietitian: She declined referral to a dietitian. She never participates in exercise. Her home blood glucose trend is fluctuating dramatically (She brought a log showing significant fluctuation in her blood glucose readings including severe hyperglycemia in the range of 40s and 50s happening at random.). (She was hospitalized for severe hyperglycemia last week due to her forgetting to take her insulin in coordination with her meals. ) An ACE inhibitor/angiotensin II receptor blocker is being taken. Eye exam is current.  Hypertension  This is a chronic problem. The current episode started more than 1 year ago. The problem is controlled. Pertinent negatives include no chest pain, headaches, palpitations or shortness of breath. Risk factors for  coronary artery disease include diabetes mellitus, dyslipidemia, obesity, sedentary lifestyle and post-menopausal state. Hypertensive end-organ damage includes PVD.  Hyperlipidemia  This is a chronic problem. The current episode started more than 1 year ago. Exacerbating diseases include diabetes and obesity. Pertinent negatives include no chest pain, myalgias or shortness of breath. Risk factors for coronary artery disease include diabetes mellitus, dyslipidemia, hypertension, a sedentary lifestyle and obesity.    Review of Systems  Constitutional: Positive for fatigue. Negative for unexpected weight change.  HENT: Negative for trouble swallowing and voice change.   Eyes: Negative for visual disturbance.  Respiratory: Negative for cough, shortness of breath and wheezing.   Cardiovascular: Negative for chest pain, palpitations and leg swelling.  Gastrointestinal: Negative for diarrhea, nausea and vomiting.  Endocrine: Negative for cold intolerance, heat intolerance, polydipsia, polyphagia and polyuria.  Musculoskeletal: Negative for arthralgias and myalgias.  Skin: Negative for color change, pallor, rash and wound.  Neurological: Negative for seizures and headaches.  Psychiatric/Behavioral: Negative for confusion and suicidal ideas.    Objective:    BP 127/79   Pulse 81   Ht 5' 1"  (1.549 m)   Wt 223 lb (101.2 kg)   BMI 42.14 kg/m   Wt Readings from Last 3 Encounters:  11/11/17 223 lb (101.2 kg)  11/02/17 231 lb (104.8 kg)  09/15/17 231 lb 3.2 oz (104.9 kg)    Physical Exam  Constitutional: She is oriented to person, place, and time. She appears well-developed.  HENT:  Head: Normocephalic and atraumatic.  Eyes: EOM are normal.  Neck: Normal range of motion. Neck supple. No tracheal deviation present. No thyromegaly present.  Cardiovascular: Normal rate and regular rhythm.  Pulmonary/Chest: Effort normal and breath sounds normal.  Abdominal: Soft. Bowel sounds are normal. There  is no tenderness. There is no guarding.  Musculoskeletal: Normal range of motion. She exhibits no edema.  Neurological: She is alert and oriented to person, place, and time. She has normal reflexes. No cranial nerve deficit. Coordination normal.  Skin: Skin is warm and dry. No rash noted. No erythema. No pallor.  Psychiatric: She has a normal mood and affect. Judgment normal.    Results for orders placed or performed during the hospital encounter of 11/04/17  T4, free  Result Value Ref Range   Free T4 1.11 0.61 - 1.12 ng/dL  TSH  Result Value Ref Range   TSH 2.741 0.350 - 4.500 uIU/mL  Comprehensive metabolic panel  Result Value Ref Range   Sodium 137 135 - 145 mmol/L   Potassium 4.6 3.5 - 5.1 mmol/L   Chloride 99 (L) 101 - 111 mmol/L   CO2 26 22 - 32 mmol/L   Glucose, Bld 170 (H) 65 - 99  mg/dL   BUN 34 (H) 6 - 20 mg/dL   Creatinine, Ser 1.38 (H) 0.44 - 1.00 mg/dL   Calcium 9.1 8.9 - 10.3 mg/dL   Total Protein 7.1 6.5 - 8.1 g/dL   Albumin 3.6 3.5 - 5.0 g/dL   AST 47 (H) 15 - 41 U/L   ALT 25 14 - 54 U/L   Alkaline Phosphatase 75 38 - 126 U/L   Total Bilirubin 0.7 0.3 - 1.2 mg/dL   GFR calc non Af Amer 39 (L) >60 mL/min   GFR calc Af Amer 45 (L) >60 mL/min   Anion gap 12 5 - 15  Renal function panel  Result Value Ref Range   Sodium 137 135 - 145 mmol/L   Potassium 4.5 3.5 - 5.1 mmol/L   Chloride 99 (L) 101 - 111 mmol/L   CO2 26 22 - 32 mmol/L   Glucose, Bld 170 (H) 65 - 99 mg/dL   BUN 35 (H) 6 - 20 mg/dL   Creatinine, Ser 1.33 (H) 0.44 - 1.00 mg/dL   Calcium 9.2 8.9 - 10.3 mg/dL   Phosphorus 3.4 2.5 - 4.6 mg/dL   Albumin 3.6 3.5 - 5.0 g/dL   GFR calc non Af Amer 41 (L) >60 mL/min   GFR calc Af Amer 47 (L) >60 mL/min   Anion gap 12 5 - 15  Hemoglobin A1c  Result Value Ref Range   Hgb A1c MFr Bld 8.2 (H) 4.8 - 5.6 %   Mean Plasma Glucose 188.64 mg/dL   Complete Blood Count (Most recent): Lab Results  Component Value Date   WBC 3.4 (L) 11/02/2017   HGB 12.0  11/02/2017   HCT 36.1 11/02/2017   MCV 95.0 11/02/2017   PLT 53 (L) 11/02/2017    Diabetic Labs (most recent): Lab Results  Component Value Date   HGBA1C 8.2 (H) 11/04/2017   HGBA1C 8.4 (H) 07/20/2017   HGBA1C 8.7 (H) 04/21/2017     Assessment & Plan:   1. Uncontrolled type 2 diabetes mellitus with complication, with long-term current use of insulin ([place])  Her diabetes is  complicated by chronic kidney disease and peripheral arterial disease as well as CHF. -She was recently admitted due to severe hyperglycemia.  -Reportedly patient is having difficulty remembering her insulin during mealtimes leading to hyperglycemia and at times using inappropriately even when they are below 90 mg/dL.   - Patient remains at a high risk for more acute and chronic complications of diabetes which include CAD, CVA, CKD, retinopathy, and neuropathy. These are all discussed in detail with the patient.  -Her recent A1c is 8.2%.     Glucose logs and insulin administration records pertaining to this visit,  to be scanned into patient's records.  Recent labs reviewed.   - I have re-counseled the patient on diet management and weight loss  by adopting a carbohydrate restricted / protein rich  Diet.  -  Suggestion is made for her to avoid simple carbohydrates  from her diet including Cakes, Sweet Desserts / Pastries, Ice Cream, Soda (diet and regular), Sweet Tea, Candies, Chips, Cookies, Store Bought Juices, Alcohol in Excess of  1-2 drinks a day, Artificial Sweeteners, and "Sugar-free" Products. This will help patient to have stable blood glucose profile and potentially avoid unintended weight gain.  - Patient is advised to stick to a routine mealtimes to eat 3 meals  a day and avoid unnecessary snacks ( to snack only to correct hypoglycemia).   - I have approached patient  with the following individualized plan to manage diabetes and patient agrees.  -She is clearly at risk of both hypoglycemia and  hyperglycemia due to possible cognitive decline.  -She is accompanied by her husband today who expresses that he would help as much as he can to remind her of her mealtimes and her insulin injections.  -I will proceed to significantly decrease her insulin dose.  -Only when she is eating and when pre-meal blood glucose readings are above 90 mg/dL she will use insulin U500 80 units before breakfast, 80 units before lunch and 80 units before supper.  - She is warned not to inject insulin without proper monitoring. -She is also required not to take insulin when she is not eating and during bedtime.  -She will continue Victoza 1.8 mg.  -Patient is encouraged to call clinic for blood glucose levels less than 70 or above 300 mg /dl. - Her continuous glucose monitoring device is now covered by her insurance, and I encouraged her to use it.   -She declined referral to the dietitian.  -Patient is not a candidate for metformin andSGLT2 inhibitors due to CKD.  - Patient specific target  for A1c; LDL, HDL, Triglycerides, and  Waist Circumference were discussed in detail.  2) BP/HTN: Her blood pressure is controlled to target.   I advised her to continue her current blood pressure medications including ACEI/ARB. 3) Lipids/HPL:  continue statins. 4)  Weight/Diet:  exercise, and carbohydrates information provided.  4) Primary hypothyroidism  -Thyroid function tests are consistent with appropriate replacement.  I advised her to continue levothyroxine 100 mcg p.o. every morning.    - We discussed about correct intake of levothyroxine, at fasting, with water, separated by at least 30 minutes from breakfast, and separated by more than 4 hours from calcium, iron, multivitamins, acid reflux medications (PPIs). -Patient is made aware of the fact that thyroid hormone replacement is needed for life, dose to be adjusted by periodic monitoring of thyroid function tests. 6) Chronic Care/Health  Maintenance:  -Patient is  on ACEI/ARB and Statin medications and encouraged to continue to follow up with Ophthalmology, Podiatrist at least yearly or according to recommendations, and advised to  stay away from smoking. I have recommended yearly flu vaccine and pneumonia vaccination at least every 5 years; and  sleep for at least 7 hours a day. - She cannot exercise at optimum level. I advised patient to maintain close follow up with her PCP for primary care needs. -This patient with risk of continued cognitive decline may need either full-time home aide or placement in a skilled care facility to avoid risk of inadvertent overdose of insulin or risk of severe hyperglycemia.  - Time spent with the patient: 25 min, of which >50% was spent in reviewing her blood glucose logs , discussing her hypo- and hyper-glycemic episodes, reviewing her current and  previous labs and insulin doses and developing a plan to avoid hypo- and hyper-glycemia. Please refer to Patient Instructions for Blood Glucose Monitoring and Insulin/Medications Dosing Guide"  in media tab for additional information.     Follow up plan: Return in about 1 week (around 11/18/2017) for follow up with meter and logs- no labs.  Glade Lloyd, MD Phone: [phone]  Fax: [phone]  This note was partially dictated with voice recognition software. Similar sounding words can be transcribed inadequately or may not  be corrected upon review.  11/11/2017, 4:12 PM

## 2017-11-11 NOTE — Patient Instructions (Signed)

## 2017-11-12 ENCOUNTER — Other Ambulatory Visit (HOSPITAL_COMMUNITY): Payer: Medicare Other

## 2017-11-13 ENCOUNTER — Other Ambulatory Visit: Payer: Self-pay | Admitting: "Endocrinology

## 2017-11-19 ENCOUNTER — Inpatient Hospital Stay (HOSPITAL_COMMUNITY): Payer: Medicare Other | Attending: Internal Medicine

## 2017-11-19 DIAGNOSIS — D500 Iron deficiency anemia secondary to blood loss (chronic): Secondary | ICD-10-CM | POA: Diagnosis present

## 2017-11-19 DIAGNOSIS — D696 Thrombocytopenia, unspecified: Secondary | ICD-10-CM

## 2017-11-19 LAB — CBC WITH DIFFERENTIAL/PLATELET
BASOS ABS: 0 10*3/uL (ref 0.0–0.1)
BASOS PCT: 0 %
EOS ABS: 0.2 10*3/uL (ref 0.0–0.7)
EOS PCT: 4 %
HCT: 35 % — ABNORMAL LOW (ref 36.0–46.0)
Hemoglobin: 11.2 g/dL — ABNORMAL LOW (ref 12.0–15.0)
Lymphocytes Relative: 27 %
Lymphs Abs: 1.2 10*3/uL (ref 0.7–4.0)
MCH: 31.1 pg (ref 26.0–34.0)
MCHC: 32 g/dL (ref 30.0–36.0)
MCV: 97.2 fL (ref 78.0–100.0)
MONO ABS: 0.2 10*3/uL (ref 0.1–1.0)
Monocytes Relative: 6 %
Neutro Abs: 2.8 10*3/uL (ref 1.7–7.7)
Neutrophils Relative %: 63 %
PLATELETS: 68 10*3/uL — AB (ref 150–400)
RBC: 3.6 MIL/uL — ABNORMAL LOW (ref 3.87–5.11)
RDW: 14.2 % (ref 11.5–15.5)
WBC: 4.4 10*3/uL (ref 4.0–10.5)

## 2017-11-19 LAB — IRON AND TIBC
IRON: 75 ug/dL (ref 28–170)
Saturation Ratios: 27 % (ref 10.4–31.8)
TIBC: 274 ug/dL (ref 250–450)
UIBC: 199 ug/dL

## 2017-11-19 LAB — FERRITIN: Ferritin: 166 ng/mL (ref 11–307)

## 2017-11-22 ENCOUNTER — Ambulatory Visit (INDEPENDENT_AMBULATORY_CARE_PROVIDER_SITE_OTHER): Payer: Medicare Other | Admitting: "Endocrinology

## 2017-11-22 ENCOUNTER — Ambulatory Visit: Payer: Medicare Other | Admitting: Nutrition

## 2017-11-22 ENCOUNTER — Encounter: Payer: Self-pay | Admitting: "Endocrinology

## 2017-11-22 ENCOUNTER — Encounter: Payer: Medicare Other | Attending: Internal Medicine | Admitting: Nutrition

## 2017-11-22 VITALS — BP 110/66 | HR 71 | Ht 61.0 in | Wt 222.0 lb

## 2017-11-22 DIAGNOSIS — Z6841 Body Mass Index (BMI) 40.0 and over, adult: Secondary | ICD-10-CM

## 2017-11-22 DIAGNOSIS — E1122 Type 2 diabetes mellitus with diabetic chronic kidney disease: Secondary | ICD-10-CM | POA: Diagnosis not present

## 2017-11-22 DIAGNOSIS — E038 Other specified hypothyroidism: Secondary | ICD-10-CM

## 2017-11-22 DIAGNOSIS — E782 Mixed hyperlipidemia: Secondary | ICD-10-CM | POA: Diagnosis not present

## 2017-11-22 DIAGNOSIS — N183 Chronic kidney disease, stage 3 unspecified: Secondary | ICD-10-CM

## 2017-11-22 MED ORDER — INSULIN REGULAR HUMAN (CONC) 500 UNIT/ML ~~LOC~~ SOPN: PEN_INJECTOR | SUBCUTANEOUS | 0 refills | Status: DC

## 2017-11-22 NOTE — Progress Notes (Signed)
Subjective:    Patient ID: Penny Martinez, female    DOB: 11-21-1950,    Past Medical History:  Diagnosis Date  . Acid reflux   . Anemia   . Arthritis   . C. difficile colitis June/July 2015  . Cancer ([place])    skin cancer  . CHF (congestive heart failure) ([place])   . 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 Negative viral markers 2015  . Complication of anesthesia   . COPD (chronic obstructive pulmonary disease) ([place])   . Diabetes mellitus without complication ([place])   . GERD (gastroesophageal reflux disease)   . History of kidney stones   . Hypercholesteremia   . Hypertension   . Hypothyroidism   . IBS (irritable bowel syndrome)   . Kidney disease   . Liver disease   . Neuropathy   . PONV (postoperative nausea and vomiting)   . Sleep apnea   . Thyroid disease    Past Surgical History:  Procedure Laterality Date  . ABDOMINAL HYSTERECTOMY    . APPENDECTOMY    . CAROTID ARTERY - SUBCLAVIAN ARTERY BYPASS GRAFT Right   . CARPAL TUNNEL RELEASE Right   . CARPAL TUNNEL RELEASE Left 12/03/2016   Procedure: CARPAL TUNNEL RELEASE;  Surgeon: Carole Civil, MD;  Location: [facility];  Service: Orthopedics;  Laterality: Left;  . CATARACT EXTRACTION Bilateral   . CHOLECYSTECTOMY    . COLONOSCOPY N/A 02/19/2014   Dr. Gala Romney: rectal varices, rectal polyp overlying a varix non-manipulated  . ESOPHAGOGASTRODUODENOSCOPY N/A 02/19/2014   Dr. Gala Romney: esophageal varices, multiple gastric polyps with largest biopsied and hyperplastic. Negative H.pylori  . GIVENS CAPSULE STUDY N/A 05/04/2014   multiple polypoid-like lesions throughout small bowel, scattered superficial non-bleeding erosions more distal bowel, referred to [facility] for [facility]oscopy. Capsule study not complete, poor images.   Marland Kitchen TRIGGER FINGER RELEASE Left 12/03/2016   Procedure: RELEASE TRIGGER FINGER/A-1 PULLEY LEFT LONG TRIGGER FINGER RELEASE;  Surgeon: Carole Civil, MD;  Location: [facility];  Service: Orthopedics;  Laterality:  Left;  . urosepsis     Social History   Socioeconomic History  . Marital status: Married    Spouse name: None  . Number of children: None  . Years of education: None  . Highest education level: None  Social Needs  . Financial resource strain: None  . Food insecurity - worry: None  . Food insecurity - inability: None  . Transportation needs - medical: None  . Transportation needs - non-medical: None  Occupational History  . None  Tobacco Use  . Smoking status: Former Smoker    Packs/day: 2.00    Years: 35.00    Pack years: 70.00    Last attempt to quit: 05/05/2003    Years since quitting: 14.5  . Smokeless tobacco: Former Systems developer  . Tobacco comment: Quit smoking 11 years ago  Substance and Sexual Activity  . Alcohol use: No  . Drug use: No  . Sexual activity: Not Currently    Birth control/protection: Surgical  Other Topics Concern  . None  Social History Narrative  . None   Outpatient Encounter Medications as of 11/22/2017  Medication Sig  . amitriptyline (ELAVIL) 25 MG tablet Take 25 mg by mouth at bedtime.  Marland Kitchen aspirin 81 MG tablet Take 81 mg by mouth every evening.   . benazepril (LOTENSIN) 20 MG tablet Take 20 mg by mouth every morning.   . Continuous Blood Gluc Sensor (FREESTYLE LIBRE SENSOR SYSTEM) MISC Use one  sensor every 10 days.  Marland Kitchen CRANBERRY-VITAMIN C PO Take 2 capsules by mouth at bedtime. Dosage is the 25,200 mg cranberry/vitamin C supplement  . diphenhydrAMINE (SOMINEX) 25 MG tablet Take 25 mg by mouth at bedtime. Reported on 01/31/2016  . diphenoxylate-atropine (LOMOTIL) 2.5-0.025 MG tablet 1-2 tablets every 4-6 hours as needed for diarrhea. (Patient taking differently: Take 1-2 tablets by mouth every 4 (four) hours as needed for diarrhea or loose stools. )  . ezetimibe (ZETIA) 10 MG tablet Take 10 mg by mouth every morning.   . fluticasone (FLONASE) 50 MCG/ACT nasal spray Place 2 sprays into both nostrils daily.  . furosemide (LASIX) 80 MG tablet Take 80 mg by  mouth every morning.   . gabapentin (NEURONTIN) 100 MG capsule TAKE (1) CAPSULE BY MOUTH THREE TIMES DAILY  . insulin regular human CONCENTRATED (HUMULIN R U-500 KWIKPEN) 500 UNIT/ML kwikpen Inject 90 units with breakfast, 80 units with lunch, and 80 units with supper when pre-meal blood glucose readings are above 90 mg/dL.  Marland Kitchen lansoprazole (PREVACID) 30 MG capsule Take 30 mg by mouth every morning.   Marland Kitchen levothyroxine (SYNTHROID, LEVOTHROID) 100 MCG tablet TAKE 1 TABLET BY MOUTH ONCE DAILY.  Marland Kitchen propranolol (INDERAL) 10 MG tablet TAKE 2 TABLETS BY MOUTH IN THE MORNING AND TAKE ONE TABLET IN THE EVENING (Patient taking differently: Take 20 mg by mouth every morning. )  . spironolactone (ALDACTONE) 25 MG tablet Take 25 mg by mouth daily.  Marland Kitchen ULTICARE SHORT PEN NEEDLES 31G X 8 MM MISC USE AS DIRECTED THREE TIMES DAILY.  Marland Kitchen VICTOZA 18 MG/3ML SOPN INJECT 1.8 MG S.Q. ONCE DAILY. (Patient taking differently: INJECT 1.8 MG SUBCUTANEOUSLY ONCE DAILY.)  . [DISCONTINUED] HUMULIN R U-500 KWIKPEN 500 UNIT/ML kwikpen INJECT 130 UNITS S.Q. THREE TIMES DAILY WITH MEALS. (Patient taking differently: INJECT 80 UNITS S.Q. THREE TIMES DAILY WITH MEALS.)  . [DISCONTINUED] insulin regular human CONCENTRATED (HUMULIN R U-500 KWIKPEN) 500 UNIT/ML kwikpen Inject 80 Units into the skin 3 (three) times daily with meals.  . [DISCONTINUED] ULTICARE SHORT PEN NEEDLES 31G X 8 MM MISC USE AS DIRECTED THREE TIMES DAILY.   No facility-administered encounter medications on file as of 11/22/2017.    ALLERGIES: Allergies  Allergen Reactions  . Erythromycin Hives  . Niacin Rash and Shortness Of Breath  . Penicillins Shortness Of Breath    Has patient had a PCN reaction causing immediate rash, facial/tongue/throat swelling, SOB or lightheadedness with hypotension: Yes Has patient had a PCN reaction causing severe rash involving mucus membranes or skin necrosis: No Has patient had a PCN reaction that required hospitalization: No Has  patient had a PCN reaction occurring within the last 10 years: No If all of the above answers are "NO", then may proceed with Cephalosporin use.   . Sulfa Antibiotics Hives  . Statins     Other reaction(s): Muscle Pain Other reaction(s): Muscle Pain  . Codeine Itching   VACCINATION STATUS: Immunization History  Administered Date(s) Administered  . Influenza,inj,Quad PF,6+ Mos 07/24/2016, 06/24/2017    Diabetes  She presents for her follow-up diabetic visit. She has type 2 diabetes mellitus. Onset time: She was diagnosed at approximate age of 46 years. Her disease course has been improving. There are no hypoglycemic associated symptoms. Pertinent negatives for hypoglycemia include no confusion, headaches, pallor or seizures. Associated symptoms include fatigue. Pertinent negatives for diabetes include no chest pain, no polydipsia, no polyphagia and no polyuria. There are no hypoglycemic complications. Symptoms are improving. Diabetic complications include heart disease,  nephropathy and PVD. Risk factors for coronary artery disease include diabetes mellitus, dyslipidemia, obesity, hypertension, sedentary lifestyle and tobacco exposure. Current diabetic treatment includes intensive insulin program. She is compliant with treatment most of the time. Her weight is decreasing steadily. She is following a generally unhealthy diet. She never participates in exercise. Her home blood glucose trend is fluctuating dramatically (She brought a log showing significant fluctuation in her blood glucose readings including severe hyperglycemia in the range of 40s and 50s happening at random.). Her breakfast blood glucose range is generally 140-180 mg/dl. Her lunch blood glucose range is generally 180-200 mg/dl. Her dinner blood glucose range is generally 140-180 mg/dl. Her bedtime blood glucose range is generally 140-180 mg/dl. Her overall blood glucose range is 140-180 mg/dl. (She was hospitalized for severe  hyperglycemia last week due to her forgetting to take her insulin in coordination with her meals. ) An ACE inhibitor/angiotensin II receptor blocker is being taken. Eye exam is current.  Hypertension  This is a chronic problem. The current episode started more than 1 year ago. The problem is controlled. Pertinent negatives include no chest pain, headaches, palpitations or shortness of breath. Risk factors for coronary artery disease include diabetes mellitus, dyslipidemia, obesity, sedentary lifestyle and post-menopausal state. Hypertensive end-organ damage includes PVD.  Hyperlipidemia  This is a chronic problem. The current episode started more than 1 year ago. Exacerbating diseases include diabetes and obesity. Pertinent negatives include no chest pain, myalgias or shortness of breath. Risk factors for coronary artery disease include diabetes mellitus, dyslipidemia, hypertension, a sedentary lifestyle and obesity.    Review of Systems  Constitutional: Positive for fatigue. Negative for unexpected weight change.  HENT: Negative for trouble swallowing and voice change.   Eyes: Negative for visual disturbance.  Respiratory: Negative for cough, shortness of breath and wheezing.   Cardiovascular: Negative for chest pain, palpitations and leg swelling.  Gastrointestinal: Negative for diarrhea, nausea and vomiting.  Endocrine: Negative for cold intolerance, heat intolerance, polydipsia, polyphagia and polyuria.  Musculoskeletal: Negative for arthralgias and myalgias.  Skin: Negative for color change, pallor, rash and wound.  Neurological: Negative for seizures and headaches.  Psychiatric/Behavioral: Negative for confusion and suicidal ideas.    Objective:    BP 110/66   Pulse 71   Ht 5' 1"  (1.549 m)   Wt 222 lb (100.7 kg)   BMI 41.95 kg/m   Wt Readings from Last 3 Encounters:  11/22/17 222 lb (100.7 kg)  11/11/17 223 lb (101.2 kg)  11/02/17 231 lb (104.8 kg)    Physical Exam   Constitutional: She is oriented to person, place, and time. She appears well-developed.  HENT:  Head: Normocephalic and atraumatic.  Eyes: EOM are normal.  Neck: Normal range of motion. Neck supple. No tracheal deviation present. No thyromegaly present.  Cardiovascular: Normal rate and regular rhythm.  Pulmonary/Chest: Effort normal and breath sounds normal.  Abdominal: Soft. Bowel sounds are normal. There is no tenderness. There is no guarding.  Musculoskeletal: Normal range of motion. She exhibits no edema.  Neurological: She is alert and oriented to person, place, and time. She has normal reflexes. No cranial nerve deficit. Coordination normal.  Skin: Skin is warm and dry. No rash noted. No erythema. No pallor.  Psychiatric: She has a normal mood and affect. Judgment normal.    Results for orders placed or performed in visit on 11/19/17  CBC with Differential/Platelet  Result Value Ref Range   WBC 4.4 4.0 - 10.5 K/uL   RBC 3.60 (  L) 3.87 - 5.11 MIL/uL   Hemoglobin 11.2 (L) 12.0 - 15.0 g/dL   HCT 35.0 (L) 36.0 - 46.0 %   MCV 97.2 78.0 - 100.0 fL   MCH 31.1 26.0 - 34.0 pg   MCHC 32.0 30.0 - 36.0 g/dL   RDW 14.2 11.5 - 15.5 %   Platelets 68 (L) 150 - 400 K/uL   Neutrophils Relative % 63 %   Neutro Abs 2.8 1.7 - 7.7 K/uL   Lymphocytes Relative 27 %   Lymphs Abs 1.2 0.7 - 4.0 K/uL   Monocytes Relative 6 %   Monocytes Absolute 0.2 0.1 - 1.0 K/uL   Eosinophils Relative 4 %   Eosinophils Absolute 0.2 0.0 - 0.7 K/uL   Basophils Relative 0 %   Basophils Absolute 0.0 0.0 - 0.1 K/uL  Ferritin  Result Value Ref Range   Ferritin 166 11 - 307 ng/mL  Iron and TIBC  Result Value Ref Range   Iron 75 28 - 170 ug/dL   TIBC 274 250 - 450 ug/dL   Saturation Ratios 27 10.4 - 31.8 %   UIBC 199 ug/dL   Complete Blood Count (Most recent): Lab Results  Component Value Date   WBC 4.4 11/19/2017   HGB 11.2 (L) 11/19/2017   HCT 35.0 (L) 11/19/2017   MCV 97.2 11/19/2017   PLT 68 (L)  11/19/2017    Diabetic Labs (most recent): Lab Results  Component Value Date   HGBA1C 8.2 (H) 11/04/2017   HGBA1C 8.4 (H) 07/20/2017   HGBA1C 8.7 (H) 04/21/2017     Assessment & Plan:   1. Uncontrolled type 2 diabetes mellitus with complication, with long-term current use of insulin ([place])  Her diabetes is  complicated by chronic kidney disease and peripheral arterial disease as well as CHF. -She was recently admitted due to severe hyperglycemia, and during her last visit she was kept on lowered dose of insulin U500. -She returns with better glycemic profile, no hypoglycemia.  - Patient remains at a high risk for more acute and chronic complications of diabetes which include CAD, CVA, CKD, retinopathy, and neuropathy. These are all discussed in detail with the patient.  -Her recent A1c is 8.2%.     Glucose logs and insulin administration records pertaining to this visit,  to be scanned into patient's records.  Recent labs reviewed.   - I have re-counseled the patient on diet management and weight loss  by adopting a carbohydrate restricted / protein rich  Diet.  -  Suggestion is made for her to avoid simple carbohydrates  from her diet including Cakes, Sweet Desserts / Pastries, Ice Cream, Soda (diet and regular), Sweet Tea, Candies, Chips, Cookies, Store Bought Juices, Alcohol in Excess of  1-2 drinks a day, Artificial Sweeteners, and "Sugar-free" Products. This will help patient to have stable blood glucose profile and potentially avoid unintended weight gain.  - Patient is advised to stick to a routine mealtimes to eat 3 meals  a day and avoid unnecessary snacks ( to snack only to correct hypoglycemia).   - I have approached patient with the following individualized plan to manage diabetes and patient agrees.  -She is clearly at risk of both hypoglycemia and hyperglycemia due to possible cognitive decline.  -She came alone today.   -Only when she is eating and when pre-meal  blood glucose readings are above 90 mg/dL she will use insulin U500 90 units before breakfast, 80 units before lunch , and 80 units before supper.  -  She is warned not to inject insulin without proper monitoring. -She is also required not to take insulin when she is not eating and during bedtime.  -She will continue Victoza 1.8 mg.  -Patient is encouraged to call clinic for blood glucose levels less than 70 or above 300 mg /dl. -She is currently wearing her CGM device showing 54% time in range, 41% above target, and 5% hypoglycemia.   -I encouraged her to use her CGM device at all times.  -She declined referral to the dietitian.  -Patient is not a candidate for metformin andSGLT2 inhibitors due to CKD.  - Patient specific target  for A1c; LDL, HDL, Triglycerides, and  Waist Circumference were discussed in detail.  2) BP/HTN: Her blood pressure is controlled to target.     I advised her to continue her current blood pressure medications including ACEI/ARB. 3) Lipids/HPL:  continue statins. 4)  Weight/Diet:  exercise, and carbohydrates information provided.  4) Primary hypothyroidism  -Her recent thyroid function tests are consistent with appropriate replacement.  I advised her to continue levothyroxine 100 mcg p.o. every morning.     - We discussed about correct intake of levothyroxine, at fasting, with water, separated by at least 30 minutes from breakfast, and separated by more than 4 hours from calcium, iron, multivitamins, acid reflux medications (PPIs). -Patient is made aware of the fact that thyroid hormone replacement is needed for life, dose to be adjusted by periodic monitoring of thyroid function tests.  6) Chronic Care/Health Maintenance:  -Patient is  on ACEI/ARB and Statin medications and encouraged to continue to follow up with Ophthalmology, Podiatrist at least yearly or according to recommendations, and advised to  stay away from smoking. I have recommended yearly flu  vaccine and pneumonia vaccination at least every 5 years; and  sleep for at least 7 hours a day. - She cannot exercise at optimum level. I advised patient to maintain close follow up with her PCP for primary care needs.  - Time spent with the patient: 25 min, of which >50% was spent in reviewing her blood glucose logs , discussing her hypo- and hyper-glycemic episodes, reviewing her current and  previous labs and insulin doses and developing a plan to avoid hypo- and hyper-glycemia. Please refer to Patient Instructions for Blood Glucose Monitoring and Insulin/Medications Dosing Guide"  in media tab for additional information. Penny Martinez participated in the discussions, expressed understanding, and voiced agreement with the above plans.  All questions were answered to her satisfaction. she is encouraged to contact clinic should she have any questions or concerns prior to her return visit.    Follow up plan: Return in about 10 weeks (around 01/31/2018) for meter, and logs.  Glade Lloyd, MD Phone: [phone]  Fax: [phone]  This note was partially dictated with voice recognition software. Similar sounding words can be transcribed inadequately or may not  be corrected upon review.  11/22/2017, 4:03 PM

## 2017-11-22 NOTE — Patient Instructions (Signed)

## 2017-11-23 ENCOUNTER — Other Ambulatory Visit: Payer: Self-pay | Admitting: "Endocrinology

## 2017-12-01 ENCOUNTER — Other Ambulatory Visit: Payer: Self-pay | Admitting: "Endocrinology

## 2017-12-02 ENCOUNTER — Other Ambulatory Visit: Payer: Self-pay | Admitting: "Endocrinology

## 2017-12-07 ENCOUNTER — Telehealth: Payer: Self-pay | Admitting: "Endocrinology

## 2017-12-07 NOTE — Telephone Encounter (Signed)
Penny Martinez is calling stating that her blood sugar readings are high  03/30 [phone]  03/31 [phone]   04/01 [phone]  Please call with any changes

## 2017-12-07 NOTE — Telephone Encounter (Signed)
Advise to increase Humulin R U500 to 100 units with breakfast, 100 units with lunch, 80 units with supper for pre-meal blood glucose readings above 90 mg/dL.  Continue to monitor blood glucose 4 times a day-before meals and at bedtime.

## 2017-12-07 NOTE — Telephone Encounter (Signed)
Pt.notified

## 2017-12-23 ENCOUNTER — Other Ambulatory Visit: Payer: Self-pay | Admitting: "Endocrinology

## 2018-01-06 ENCOUNTER — Ambulatory Visit: Payer: Medicare Other | Admitting: Gastroenterology

## 2018-01-13 ENCOUNTER — Telehealth: Payer: Self-pay | Admitting: "Endocrinology

## 2018-01-13 NOTE — Telephone Encounter (Signed)
Left pt a message for her to call back and let us know which lab test she is requesting.

## 2018-01-13 NOTE — Telephone Encounter (Signed)
Penny Martinez is calling asking if Dr. Dorris Fetch could add a lab test to her lab orders, please advise?

## 2018-01-14 ENCOUNTER — Ambulatory Visit (HOSPITAL_COMMUNITY): Payer: Medicare Other | Admitting: Internal Medicine

## 2018-01-14 ENCOUNTER — Other Ambulatory Visit (HOSPITAL_COMMUNITY): Payer: Medicare Other

## 2018-01-17 ENCOUNTER — Telehealth: Payer: Self-pay

## 2018-01-17 NOTE — Telephone Encounter (Signed)
Pt states her neurologist was requesting that we add an ammonia test to her labs. They did a blood draw at the office but were not equiped to do this test in the office.

## 2018-01-18 NOTE — Telephone Encounter (Signed)
We are not equipped to do labs here either, or interpret ammonia test.

## 2018-01-19 ENCOUNTER — Ambulatory Visit (INDEPENDENT_AMBULATORY_CARE_PROVIDER_SITE_OTHER): Payer: Medicare Other | Admitting: Gastroenterology

## 2018-01-19 ENCOUNTER — Other Ambulatory Visit (HOSPITAL_COMMUNITY)
Admission: RE | Admit: 2018-01-19 | Discharge: 2018-01-19 | Disposition: A | Discharge status: Home / Self Care | Payer: Medicare Other | Source: Ambulatory Visit | Attending: Gastroenterology | Admitting: Gastroenterology

## 2018-01-19 ENCOUNTER — Other Ambulatory Visit: Payer: Self-pay

## 2018-01-19 ENCOUNTER — Inpatient Hospital Stay (HOSPITAL_COMMUNITY): Payer: Medicare Other | Attending: Internal Medicine

## 2018-01-19 ENCOUNTER — Encounter: Payer: Self-pay | Admitting: Gastroenterology

## 2018-01-19 VITALS — BP 128/57 | HR 81 | Temp 97.0°F | Ht 61.0 in | Wt 220.2 lb

## 2018-01-19 DIAGNOSIS — I10 Essential (primary) hypertension: Secondary | ICD-10-CM | POA: Insufficient documentation

## 2018-01-19 DIAGNOSIS — R1310 Dysphagia, unspecified: Secondary | ICD-10-CM

## 2018-01-19 DIAGNOSIS — K7469 Other cirrhosis of liver: Secondary | ICD-10-CM | POA: Diagnosis not present

## 2018-01-19 DIAGNOSIS — D696 Thrombocytopenia, unspecified: Secondary | ICD-10-CM | POA: Diagnosis not present

## 2018-01-19 DIAGNOSIS — R1319 Other dysphagia: Secondary | ICD-10-CM

## 2018-01-19 DIAGNOSIS — E039 Hypothyroidism, unspecified: Secondary | ICD-10-CM | POA: Insufficient documentation

## 2018-01-19 DIAGNOSIS — D500 Iron deficiency anemia secondary to blood loss (chronic): Secondary | ICD-10-CM | POA: Diagnosis not present

## 2018-01-19 LAB — CBC WITH DIFFERENTIAL/PLATELET
Basophils Absolute: 0 10*3/uL (ref 0.0–0.1)
Basophils Relative: 0 %
Eosinophils Absolute: 0.1 10*3/uL (ref 0.0–0.7)
Eosinophils Relative: 2 %
HEMATOCRIT: 36.4 % (ref 36.0–46.0)
Hemoglobin: 12.1 g/dL (ref 12.0–15.0)
LYMPHS PCT: 23 %
Lymphs Abs: 1.2 10*3/uL (ref 0.7–4.0)
MCH: 31.4 pg (ref 26.0–34.0)
MCHC: 33.2 g/dL (ref 30.0–36.0)
MCV: 94.5 fL (ref 78.0–100.0)
MONOS PCT: 8 %
Monocytes Absolute: 0.4 10*3/uL (ref 0.1–1.0)
NEUTROS ABS: 3.6 10*3/uL (ref 1.7–7.7)
Neutrophils Relative %: 67 %
Platelets: 86 10*3/uL — ABNORMAL LOW (ref 150–400)
RBC: 3.85 MIL/uL — ABNORMAL LOW (ref 3.87–5.11)
RDW: 13 % (ref 11.5–15.5)
WBC: 5.3 10*3/uL (ref 4.0–10.5)

## 2018-01-19 LAB — COMPREHENSIVE METABOLIC PANEL
ALT: 25 U/L (ref 14–54)
ANION GAP: 9 (ref 5–15)
AST: 38 U/L (ref 15–41)
Albumin: 3.8 g/dL (ref 3.5–5.0)
Alkaline Phosphatase: 99 U/L (ref 38–126)
BILIRUBIN TOTAL: 0.9 mg/dL (ref 0.3–1.2)
BUN: 30 mg/dL — AB (ref 6–20)
CO2: 29 mmol/L (ref 22–32)
Calcium: 9.5 mg/dL (ref 8.9–10.3)
Chloride: 95 mmol/L — ABNORMAL LOW (ref 101–111)
Creatinine, Ser: 1.18 mg/dL — ABNORMAL HIGH (ref 0.44–1.00)
GFR calc Af Amer: 54 mL/min — ABNORMAL LOW (ref >60)
GFR, EST NON AFRICAN AMERICAN: 47 mL/min — AB (ref >60)
Glucose, Bld: 217 mg/dL — ABNORMAL HIGH (ref 65–99)
POTASSIUM: 4.4 mmol/L (ref 3.5–5.1)
Sodium: 133 mmol/L — ABNORMAL LOW (ref 135–145)
TOTAL PROTEIN: 7.4 g/dL (ref 6.5–8.1)

## 2018-01-19 LAB — IRON AND TIBC
Iron: 70 ug/dL (ref 28–170)
Saturation Ratios: 21 % (ref 10.4–31.8)
TIBC: 333 ug/dL (ref 250–450)
UIBC: 263 ug/dL

## 2018-01-19 LAB — FERRITIN: FERRITIN: 75 ng/mL (ref 11–307)

## 2018-01-19 LAB — PROTIME-INR
INR: 1.12
PROTHROMBIN TIME: 14.3 s (ref 11.4–15.2)

## 2018-01-19 LAB — AMMONIA: Ammonia: 96 umol/L — ABNORMAL HIGH (ref 9–35)

## 2018-01-19 NOTE — Progress Notes (Signed)
Referring Provider: [facility]* Primary Care Physician:  [facility]  Primary GI: Dr. Gala Romney   Chief Complaint  Patient presents with  . Gastroesophageal Reflux    f/u. doing okay  . Cirrhosis    f/u.    HPI:   Penny Martinez is a 67 y.o. 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 History of chronic diarrhea, with Lomotil working well historically. History of IDA, followed by hematology. Propanolol 20 mg daily for primary bleeding prophylaxis. Due for US abdomen now. Colonoscopy due 2020. She has been having issues with dysphagia. Here to discuss EGD, last in 2015 with Grade 2 esophageal varices. She was started on propanolol at that time. Her renal function has been slowly declining over time, and she is followed by Nephrology.   She has had intermittent bout of confusion that are primarily related to UTIs, hyperglycemic episodes. She is seeing a Garment/textile technologist. Started on Aricept. They requested an ammonia level per patient, but she hasn't been able to get that done with labs that were done this morning. She has not had a recent INR. Recent LFTs normal. Still with intermittent solid food dysphagia.   No abdominal pain. Chronic diarrhea managed well on Lomotil.      Past Medical History:  Diagnosis Date  . Acid reflux   . Anemia   . Arthritis   . C. difficile colitis June/July 2015  . Cancer ([place])    skin cancer  . CHF (congestive heart failure) ([place])   . Cirrhosis ([place])    likely due to NASH. Negative viral markers 2015  . Complication of anesthesia   . COPD (chronic obstructive pulmonary disease) ([place])   . Diabetes mellitus without complication ([place])   . GERD (gastroesophageal reflux disease)   . History of kidney stones   . Hypercholesteremia   . Hypertension   . Hypothyroidism   . IBS (irritable bowel syndrome)   . Kidney disease   . Liver disease   . Neuropathy   . PONV (postoperative nausea and  vomiting)   . Sleep apnea   . Thyroid disease     Past Surgical History:  Procedure Laterality Date  . ABDOMINAL HYSTERECTOMY    . APPENDECTOMY    . CAROTID ARTERY - SUBCLAVIAN ARTERY BYPASS GRAFT Right   . CARPAL TUNNEL RELEASE Right   . CARPAL TUNNEL RELEASE Left 12/03/2016   Procedure: CARPAL TUNNEL RELEASE;  Surgeon: Carole Civil, MD;  Location: [facility];  Service: Orthopedics;  Laterality: Left;  . CATARACT EXTRACTION Bilateral   . CHOLECYSTECTOMY    . COLONOSCOPY N/A 02/19/2014   Dr. Gala Romney: rectal varices, rectal polyp overlying a varix non-manipulated  . ESOPHAGOGASTRODUODENOSCOPY N/A 02/19/2014   Dr. Gala Romney: 4 columns of grade 2 esophageal varices, multiple gastric polyps with largest biopsied and hyperplastic. Negative H.pylori  . GIVENS CAPSULE STUDY N/A 05/04/2014   multiple polypoid-like lesions throughout small bowel, scattered superficial non-bleeding erosions more distal bowel, referred to [facility] for enteroscopy. Capsule study not complete, poor images.   Marland Kitchen TRIGGER FINGER RELEASE Left 12/03/2016   Procedure: RELEASE TRIGGER FINGER/A-1 PULLEY LEFT LONG TRIGGER FINGER RELEASE;  Surgeon: Carole Civil, MD;  Location: [facility];  Service: Orthopedics;  Laterality: Left;  . urosepsis      Current Outpatient Medications  Medication Sig Dispense Refill  . amitriptyline (ELAVIL) 25 MG tablet Take 25 mg by mouth at bedtime.    Marland Kitchen aspirin 81 MG tablet  Take 81 mg by mouth every evening.     . benazepril (LOTENSIN) 20 MG tablet Take 20 mg by mouth every morning.     . Continuous Blood Gluc Sensor (FREESTYLE LIBRE SENSOR SYSTEM) MISC Use one sensor every 10 days. 3 each 2  . CRANBERRY-VITAMIN C PO Take 2 capsules by mouth at bedtime. Dosage is the 25,200 mg cranberry/vitamin C supplement    . diphenhydrAMINE (SOMINEX) 25 MG tablet Take 25 mg by mouth at bedtime. Reported on 01/31/2016    . diphenoxylate-atropine (LOMOTIL) 2.5-0.025 MG tablet 1-2 tablets every 4-6 hours as needed  for diarrhea. (Patient taking differently: Take 1-2 tablets by mouth every 4 (four) hours as needed for diarrhea or loose stools. ) 120 tablet 3  . donepezil (ARICEPT) 5 MG tablet Take 1 tablet by mouth daily.    Marland Kitchen ezetimibe (ZETIA) 10 MG tablet Take 10 mg by mouth every morning.     . furosemide (LASIX) 80 MG tablet Take 80 mg by mouth every morning.     . gabapentin (NEURONTIN) 100 MG capsule TAKE (1) CAPSULE BY MOUTH THREE TIMES DAILY 90 capsule 3  . insulin regular human CONCENTRATED (HUMULIN R U-500 KWIKPEN) 500 UNIT/ML kwikpen Inject 90 units with breakfast, 80 units with lunch, and 80 units with supper when pre-meal blood glucose readings are above 90 mg/dL. 18 mL 0  . lansoprazole (PREVACID) 30 MG capsule Take 30 mg by mouth every morning.     Marland Kitchen levothyroxine (SYNTHROID, LEVOTHROID) 100 MCG tablet TAKE 1 TABLET BY MOUTH ONCE DAILY. 90 tablet 0  . propranolol (INDERAL) 10 MG tablet TAKE 2 TABLETS BY MOUTH IN THE MORNING AND TAKE ONE TABLET IN THE EVENING (Patient taking differently: Take 20 mg by mouth every morning. ) 90 tablet 5  . spironolactone (ALDACTONE) 25 MG tablet Take 25 mg by mouth daily.  5  . ULTICARE SHORT PEN NEEDLES 31G X 8 MM MISC USE AS DIRECTED THREE TIMES DAILY. 100 each 5  . VICTOZA 18 MG/3ML SOPN INJECT 1.8MG S.Q. ONCE DAILY 9 mL 2   No current facility-administered medications for this visit.     Allergies as of 01/19/2018 - Review Complete 01/19/2018  Allergen Reaction Noted  . Erythromycin Hives 02/15/2014  . Niacin Rash and Shortness Of Breath 03/05/2014  . Penicillins Shortness Of Breath 02/15/2014  . Sulfa antibiotics Hives 02/15/2014  . Statins  03/05/2014  . Codeine Itching 11/26/2016    Family History  Problem Relation Age of Onset  . Diabetes Mother   . Hypertension Mother   . Kidney failure Mother   . Blindness Mother   . Aneurysm Father   . Colon cancer Neg Hx     Social History   Socioeconomic History  . Marital status: Married     Spouse name: Not on file  . Number of children: Not on file  . Years of education: Not on file  . Highest education level: Not on file  Occupational History  . Not on file  Social Needs  . Financial resource strain: Not on file  . Food insecurity:    Worry: Not on file    Inability: Not on file  . Transportation needs:    Medical: Not on file    Non-medical: Not on file  Tobacco Use  . Smoking status: Former Smoker    Packs/day: 2.00    Years: 35.00    Pack years: 70.00    Last attempt to quit: 05/05/2003    Years since  quitting: 14.7  . Smokeless tobacco: Former Systems developer  . Tobacco comment: Quit smoking 11 years ago  Substance and Sexual Activity  . Alcohol use: No  . Drug use: No  . Sexual activity: Not Currently    Birth control/protection: Surgical  Lifestyle  . Physical activity:    Days per week: Not on file    Minutes per session: Not on file  . Stress: Not on file  Relationships  . Social connections:    Talks on phone: Not on file    Gets together: Not on file    Attends religious service: Not on file    Active member of club or organization: Not on file    Attends meetings of clubs or organizations: Not on file    Relationship status: Not on file  Other Topics Concern  . Not on file  Social History Narrative  . Not on file    Review of Systems: Gen: Denies fever, chills, anorexia. Denies fatigue, weakness, weight loss.  CV: Denies chest pain, palpitations, syncope, peripheral edema, and claudication. Resp: Denies dyspnea at rest, cough, wheezing, coughing up blood, and pleurisy. GI: see HPI  Derm: Denies rash, itching, dry skin Psych: see HPI  Heme: Denies bruising, bleeding, and enlarged lymph nodes.  Physical Exam: BP (!) 128/57   Pulse 81   Temp (!) 97 F (36.1 C) (Oral)   Ht 5' 1"  (1.549 m)   Wt 220 lb 3.2 oz (99.9 kg)   BMI 41.61 kg/m  General:   Alert and oriented. No distress noted. Pleasant and cooperative.  Head:  Normocephalic and  atraumatic. Eyes:  Conjuctiva clear without scleral icterus. Mouth:  Oral mucosa pink and moist.  Abdomen:  +BS, soft, non-tender and non-distended. No rebound or guarding. No HSM or masses noted. Msk:  Symmetrical without gross deformities. Normal posture. Extremities:  Without edema. Neurologic:  Alert and  oriented x4 Psych:  Alert and cooperative. Normal mood and affect.  Lab Results  Component Value Date   ALT 25 01/19/2018   AST 38 01/19/2018   ALKPHOS 99 01/19/2018   BILITOT 0.9 01/19/2018   Lab Results  Component Value Date   WBC 5.3 01/19/2018   HGB 12.1 01/19/2018   HCT 36.4 01/19/2018   MCV 94.5 01/19/2018   PLT 86 (L) 01/19/2018   Lab Results  Component Value Date   CREATININE 1.18 (H) 01/19/2018   BUN 30 (H) 01/19/2018   NA 133 (L) 01/19/2018   K 4.4 01/19/2018   CL 95 (L) 01/19/2018   CO2 29 01/19/2018

## 2018-01-19 NOTE — Assessment & Plan Note (Signed)
Very pleasant 67 year old female with history of NASH cirrhosis, due for [place] now. Last EGD in 2015 with Grade 2 varices and started on propanolol at that time. However, over the years, she has had slowly declining renal function in the setting of multiple health issues, diabetes, etc. Overall, renal function is stable, but I discussed with her that I feel it is best that we taper off of propanolol and move to serial EGDs instead due preserve renal function. She is followed by Nephrology.   Also of note, episode confusion that has been evaluated by Neurology. Confusion is related to episodes of acute illness, hyperglycemia, otherwise she has no evidence for encephalopathic episodes. Neurology is requesting an ammonia level, which we can order. However, this is transient and will be non-specific in this setting. Recommend continued follow-up with Neurology. She has chroni diarrhea, so I would like to stay away from lactulose and don't feel it would be helpful on a day to day basis. If concern for hepatic encephalopathy, could start Xifaxan. There is also concern from Neurology for an underlying mild dementia, age-related changes.   Due to dysphagia, we are arranging an EGD (last in 2015). Variceal assessment can be completed at same time. Tapering off propanolol due to CKD and will move towards serial EGDs as appropriate. Discussed with patient who is agreeable and aware of rationale.  Proceed with upper endoscopy/dilation in the near future with Dr. Gala Romney. The risks, benefits, and alternatives have been discussed in detail with patient. They have stated understanding and desire to proceed.  Propanolol tapering described Continue Prevacid daily US abdomen now Ammonia as requested by Neurology. Add INR to labs already ordered today Return in 6 months

## 2018-01-19 NOTE — Patient Instructions (Addendum)
We have arranged an upper endoscopy with dilation by Dr. Gala Romney in the near future.  I have also ordered a routine ultrasound to evaluate your liver. I've ordered the ammonia level and an INR to add to the labs today that you've already had drawn (if we can).  To taper the propanolol: we will actually do this over 2-3 weeks. Starting tomorrow, take only half the dose daily for one week (10 milligrams daily). The next week, take 1/2 tablet daily. Third week: take 1/2 tablet every other day for one week and stop. We don't want to abruptly stop this, so we are going to just taper down the dose over 2-3 weeks.  I will see you in 6 months!  It was a pleasure to see you today. I strive to create trusting relationships with patients to provide genuine, compassionate, and quality care. I value your feedback. If you receive a survey regarding your visit,  I greatly appreciate you taking time to fill this out.   Annitta Needs, PhD, ANP-BC [facility]

## 2018-01-19 NOTE — Assessment & Plan Note (Signed)
Query web, ring, stricture, esophagitis. Doubt malignancy. EGD/dilation as planned.

## 2018-01-20 ENCOUNTER — Other Ambulatory Visit: Payer: Self-pay | Admitting: "Endocrinology

## 2018-01-20 LAB — COMPLETE METABOLIC PANEL WITH GFR
AG RATIO: 1.3 (calc) (ref 1.0–2.5)
ALT: 21 U/L (ref 6–29)
AST: 33 U/L (ref 10–35)
Albumin: 3.9 g/dL (ref 3.6–5.1)
Alkaline phosphatase (APISO): 102 U/L (ref 33–130)
BUN/Creatinine Ratio: 22 (calc) (ref 6–22)
BUN: 29 mg/dL — ABNORMAL HIGH (ref 7–25)
CO2: 32 mmol/L (ref 20–32)
Calcium: 9.8 mg/dL (ref 8.6–10.4)
Chloride: 96 mmol/L — ABNORMAL LOW (ref 98–110)
Creat: 1.29 mg/dL — ABNORMAL HIGH (ref 0.50–0.99)
GFR, EST AFRICAN AMERICAN: 50 mL/min/{1.73_m2} — AB (ref >=60)
GFR, EST NON AFRICAN AMERICAN: 43 mL/min/{1.73_m2} — AB (ref >=60)
Globulin: 3.1 g/dL (calc) (ref 1.9–3.7)
Glucose, Bld: 228 mg/dL — ABNORMAL HIGH (ref 65–139)
Potassium: 4.7 mmol/L (ref 3.5–5.3)
Sodium: 135 mmol/L (ref 135–146)
TOTAL PROTEIN: 7 g/dL (ref 6.1–8.1)
Total Bilirubin: 0.7 mg/dL (ref 0.2–1.2)

## 2018-01-20 LAB — HEMOGLOBIN A1C
HEMOGLOBIN A1C: 8 %{Hb} — AB (ref <5.7)
Mean Plasma Glucose: 183 (calc)
eAG (mmol/L): 10.1 (calc)

## 2018-01-20 LAB — T4, FREE: Free T4: 1.3 ng/dL (ref 0.8–1.8)

## 2018-01-20 LAB — TSH: TSH: 2.71 mIU/L (ref 0.40–4.50)

## 2018-01-20 NOTE — Progress Notes (Signed)
cc'ed to pcp °

## 2018-01-21 ENCOUNTER — Encounter (HOSPITAL_COMMUNITY): Payer: Self-pay | Admitting: Internal Medicine

## 2018-01-21 ENCOUNTER — Inpatient Hospital Stay (HOSPITAL_BASED_OUTPATIENT_CLINIC_OR_DEPARTMENT_OTHER): Payer: Medicare Other | Admitting: Internal Medicine

## 2018-01-21 ENCOUNTER — Other Ambulatory Visit: Payer: Self-pay

## 2018-01-21 VITALS — BP 139/43 | HR 75 | Resp 18 | Wt 221.4 lb

## 2018-01-21 DIAGNOSIS — D500 Iron deficiency anemia secondary to blood loss (chronic): Secondary | ICD-10-CM | POA: Diagnosis not present

## 2018-01-21 DIAGNOSIS — D696 Thrombocytopenia, unspecified: Secondary | ICD-10-CM | POA: Diagnosis not present

## 2018-01-21 DIAGNOSIS — E039 Hypothyroidism, unspecified: Secondary | ICD-10-CM

## 2018-01-21 DIAGNOSIS — I10 Essential (primary) hypertension: Secondary | ICD-10-CM | POA: Diagnosis not present

## 2018-01-21 NOTE — Progress Notes (Signed)
Diagnosis Iron deficiency anemia due to chronic blood loss - Plan: CBC with Differential/Platelet, Comprehensive metabolic panel, Ferritin, CBC with Differential/Platelet, Comprehensive metabolic panel, Ferritin  Staging Cancer Staging No matching staging information was found for the patient.  Assessment and Plan:  1.  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  Hospitalized in 2015 secondary to symptomatic anemia requiring PRBC transfusion. Capsule study with multiple polypoid like lesions throughout small bowel, scattered superficial non-bleeding erosions.  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  She was last treated with IV iron on 09/23/2017.  Labs done 01/19/2018 and reviewed with the patient show white count 5.3 hemoglobin 12.1 platelets 86,000.  Ferritin 75, creatinine 1.18.  Iron deficiency without anemia usually occurs when ferritin is less than 40.  She will have repeat labs in 2 months.  She will be seen for follow-up in 4 months with repeat labs.  She should notify the office if she has any problems prior to that time.  2.  Thrombocytopenia.  Blood count is 86,000.  Labs done March 2019 showed a platelet count of 68,000.  Etiology likely due to cirrhosis and splenomegaly which was noted on prior imaging with CT abdomen and pelvis done March 05, 2014.  3.  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  She should continue to follow-up with GI as recommended.  Patient had a CT of abdomen and pelvis done March 05, 2014 that showed evidence of cirrhosis with splenomegaly.  4.  Hypertension.  Blood pressure is 139/43.  Follow-up with PCP.  5.  Hypothyroidism.  She is on Synthroid.  Follow-up with PCP for monitoring.  6 . Diarrhea/nausea.  Follow-up with GI as directed.      Current Status: She is seen today for follow-up.  She is here to go over labs.  She was last treated with IV iron in January 2019.   Problem  List Patient Active Problem List   Diagnosis Date Noted  . Other dysphagia [R13.19] 09/15/2017  . Uncontrolled type 2 diabetes mellitus with complication ([place]) [O24.2, E11.65] 04/27/2017  . Mixed hyperlipidemia [E78.2] 04/27/2017  . Hyponatremia [E87.1] 04/27/2017  . Chronic diarrhea [K52.9] 12/15/2016  . Carpal tunnel syndrome, left [G56.02]   . Trigger middle finger of left hand [M65.332]   . Thrombocytopenia ([place]) [D69.6] 11/04/2016  . Iron deficiency anemia due to chronic blood loss [D50.0] 01/31/2016  . Other specified hypothyroidism [E03.8] 09/20/2015  . Essential hypertension, benign [I10] 07/12/2015  . Morbid obesity with BMI of 40.0-44.9, adult ([place]) [E66.01, Z68.41] 07/12/2015  . Hepatic cirrhosis ([place]) [K74.60] 04/02/2014  . Acute colitis [K52.9] 03/05/2014  . Abdominal pain, acute, left lower quadrant [R10.32] 03/05/2014  . Colitis [K52.9] 03/05/2014  . Splenomegaly, congestive, chronic [D73.2] 02/17/2014  . Sepsis ([place]) [A41.9] 02/15/2014  . Absolute anemia [D64.9] 02/15/2014  . Diabetes mellitus with stage 3 chronic kidney disease ([place]) [P53.61, N18.3] 02/15/2014  . CHF (congestive heart failure) ([place]) [I50.9] 02/15/2014  . CKD (chronic kidney disease) [N18.9] 02/15/2014  . UTI (lower urinary tract infection) [N39.0] 02/15/2014    Past Medical History Past Medical History:  Diagnosis Date  . Acid reflux   . Anemia   . Arthritis   . C. difficile colitis June/July 2015  . Cancer ([place])    skin cancer  . CHF (congestive heart failure) ([place])   . 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 Negative viral markers 2015  . Complication of anesthesia   .  COPD (chronic obstructive pulmonary disease) ([place])   . Diabetes mellitus without complication ([place])   . GERD (gastroesophageal reflux disease)   . History of kidney stones   . Hypercholesteremia   . Hypertension   . Hypothyroidism   . IBS (irritable bowel syndrome)   . Kidney disease   . Liver disease   . Neuropathy    . PONV (postoperative nausea and vomiting)   . Sleep apnea   . Thyroid disease     Past Surgical History Past Surgical History:  Procedure Laterality Date  . ABDOMINAL HYSTERECTOMY    . APPENDECTOMY    . CAROTID ARTERY - SUBCLAVIAN ARTERY BYPASS GRAFT Right   . CARPAL TUNNEL RELEASE Right   . CARPAL TUNNEL RELEASE Left 12/03/2016   Procedure: CARPAL TUNNEL RELEASE;  Surgeon: Carole Civil, MD;  Location: [facility];  Service: Orthopedics;  Laterality: Left;  . CATARACT EXTRACTION Bilateral   . CHOLECYSTECTOMY    . COLONOSCOPY N/A 02/19/2014   Dr. Gala Romney: rectal varices, rectal polyp overlying a varix non-manipulated  . ESOPHAGOGASTRODUODENOSCOPY N/A 02/19/2014   Dr. Gala Romney: 4 columns of grade 2 esophageal varices, multiple gastric polyps with largest biopsied and hyperplastic. Negative H.pylori  . GIVENS CAPSULE STUDY N/A 05/04/2014   multiple polypoid-like lesions throughout small bowel, scattered superficial non-bleeding erosions more distal bowel, referred to [facility] for enteroscopy. Capsule study not complete, poor images.   Marland Kitchen TRIGGER FINGER RELEASE Left 12/03/2016   Procedure: RELEASE TRIGGER FINGER/A-1 PULLEY LEFT LONG TRIGGER FINGER RELEASE;  Surgeon: Carole Civil, MD;  Location: [facility];  Service: Orthopedics;  Laterality: Left;  . urosepsis      Family History Family History  Problem Relation Age of Onset  . Diabetes Mother   . Hypertension Mother   . Kidney failure Mother   . Blindness Mother   . Aneurysm Father   . Colon cancer Neg Hx      Social History  reports that she quit smoking about 14 years ago. She has a 70.00 pack-year smoking history. She has quit using smokeless tobacco. She reports that she does not drink alcohol or use drugs.  Medications  Current Outpatient Medications:  .  amitriptyline (ELAVIL) 25 MG tablet, Take 25 mg by mouth at bedtime., Disp: , Rfl:  .  aspirin 81 MG tablet, Take 81 mg by mouth every evening. , Disp: , Rfl:  .   benazepril (LOTENSIN) 20 MG tablet, Take 20 mg by mouth every morning. , Disp: , Rfl:  .  Continuous Blood Gluc Sensor ([place]) MISC, Use one sensor every 10 days., Disp: 3 each, Rfl: 2 .  CRANBERRY-VITAMIN C PO, Take 2 capsules by mouth at bedtime. Dosage is the 25,200 mg cranberry/vitamin C supplement, Disp: , Rfl:  .  diphenhydrAMINE (SOMINEX) 25 MG tablet, Take 25 mg by mouth at bedtime. Reported on 01/31/2016, Disp: , Rfl:  .  diphenoxylate-atropine (LOMOTIL) 2.5-0.025 MG tablet, 1-2 tablets every 4-6 hours as needed for diarrhea. (Patient taking differently: Take 1-2 tablets by mouth every 4 (four) hours as needed for diarrhea or loose stools. ), Disp: 120 tablet, Rfl: 3 .  donepezil (ARICEPT) 5 MG tablet, Take 1 tablet by mouth daily., Disp: , Rfl:  .  ezetimibe (ZETIA) 10 MG tablet, Take 10 mg by mouth every morning. , Disp: , Rfl:  .  furosemide (LASIX) 80 MG tablet, Take 80 mg by mouth every morning. , Disp: , Rfl:  .  gabapentin (NEURONTIN) 100 MG capsule, TAKE (  1) CAPSULE BY MOUTH THREE TIMES DAILY, Disp: 90 capsule, Rfl: 3 .  HUMULIN R U-500 KWIKPEN 500 UNIT/ML kwikpen, INJECT 130 UNITS S.Q. THREE TIMES DAILY WITH MEALS., Disp: 18 mL, Rfl: 0 .  lansoprazole (PREVACID) 30 MG capsule, Take 30 mg by mouth every morning. , Disp: , Rfl:  .  levothyroxine (SYNTHROID, LEVOTHROID) 100 MCG tablet, TAKE 1 TABLET BY MOUTH ONCE DAILY., Disp: 90 tablet, Rfl: 0 .  propranolol (INDERAL) 10 MG tablet, TAKE 2 TABLETS BY MOUTH IN THE MORNING AND TAKE ONE TABLET IN THE EVENING (Patient taking differently: Take 20 mg by mouth every morning. ), Disp: 90 tablet, Rfl: 5 .  spironolactone (ALDACTONE) 25 MG tablet, Take 25 mg by mouth daily., Disp: , Rfl: 5 .  ULTICARE SHORT PEN NEEDLES 31G X 8 MM MISC, USE AS DIRECTED THREE TIMES DAILY., Disp: 100 each, Rfl: 5 .  VICTOZA 18 MG/3ML SOPN, INJECT 1.8MG S.Q. ONCE DAILY, Disp: 9 mL, Rfl: 2 .  Vitamin D, Ergocalciferol, (DRISDOL) 50000 units  CAPS capsule, , Disp: , Rfl: 1  Allergies Erythromycin; Niacin; Penicillins; Sulfa antibiotics; Statins; and Codeine  Review of Systems Review of Systems - Oncology ROS as per HPI otherwise 12 point ROS is negative other than diarrhea and nausea   Physical Exam  Vitals Wt Readings from Last 3 Encounters:  01/21/18 221 lb 6.4 oz (100.4 kg)  01/19/18 220 lb 3.2 oz (99.9 kg)  11/22/17 222 lb (100.7 kg)   Temp Readings from Last 3 Encounters:  01/19/18 (!) 97 F (36.1 C) (Oral)  11/02/17 98.6 F (37 C) (Oral)  09/23/17 98 F (36.7 C) (Oral)   BP Readings from Last 3 Encounters:  01/21/18 (!) 139/43  01/19/18 (!) 128/57  11/22/17 110/66   Pulse Readings from Last 3 Encounters:  01/21/18 75  01/19/18 81  11/22/17 71   Constitutional: Well-developed, well-nourished, and in no distress.   HENT: Head: Normocephalic and atraumatic.  Mouth/Throat: No oropharyngeal exudate. Mucosa moist. Eyes: Pupils are equal, round, and reactive to light. Conjunctivae are normal. No scleral icterus.  Neck: Normal range of motion. Neck supple. No JVD present.  Cardiovascular: Normal rate, regular rhythm and normal heart sounds.  Exam reveals no gallop and no friction rub.   No murmur heard. Pulmonary/Chest: Effort normal and breath sounds normal. No respiratory distress. No wheezes.No rales.  Abdominal: Soft. Bowel sounds are normal. No distension. There is no tenderness. There is no guarding.  Musculoskeletal: No edema or tenderness.  Lymphadenopathy: No cervical, axillaryor supraclavicular adenopathy.  Neurological: Alert and oriented to person, place, and time. No cranial nerve deficit.  Skin: Skin is warm and dry. No rash noted. No erythema. No pallor.  Psychiatric: Affect and judgment normal.   [facility] Outpatient Visit on 01/19/2018  Component Date Value Ref Range Status  . Prothrombin Time 01/19/2018 14.3  11.4 - 15.2 seconds Final  . INR 01/19/2018 1.12   Final   Performed at  [facility], [address] [place], [place] [address]  . Ammonia 01/19/2018 96* 9 - 35 umol/L Final   Performed at [facility], [address], [place], [place] [address]  Appointment on 01/19/2018  Component Date Value Ref Range Status  . WBC 01/19/2018 5.3  4.0 - 10.5 K/uL Final  . RBC 01/19/2018 3.85* 3.87 - 5.11 MIL/uL Final  . Hemoglobin 01/19/2018 12.1  12.0 - 15.0 g/dL Final  . HCT 01/19/2018 36.4  36.0 - 46.0 % Final  . MCV 01/19/2018 94.5  78.0 -  100.0 fL Final  . MCH 01/19/2018 31.4  26.0 - 34.0 pg Final  . MCHC 01/19/2018 33.2  30.0 - 36.0 g/dL Final  . RDW 01/19/2018 13.0  11.5 - 15.5 % Final  . Platelets 01/19/2018 86* 150 - 400 K/uL Final   Comment: CONSISTENT WITH PREVIOUS RESULT SPECIMEN CHECKED FOR CLOTS   . Neutrophils Relative % 01/19/2018 67  % Final  . Neutro Abs 01/19/2018 3.6  1.7 - 7.7 K/uL Final  . Lymphocytes Relative 01/19/2018 23  % Final  . Lymphs Abs 01/19/2018 1.2  0.7 - 4.0 K/uL Final  . Monocytes Relative 01/19/2018 8  % Final  . Monocytes Absolute 01/19/2018 0.4  0.1 - 1.0 K/uL Final  . Eosinophils Relative 01/19/2018 2  % Final  . Eosinophils Absolute 01/19/2018 0.1  0.0 - 0.7 K/uL Final  . Basophils Relative 01/19/2018 0  % Final  . Basophils Absolute 01/19/2018 0.0  0.0 - 0.1 K/uL Final   Performed at [facility], [address] [place], [place] [address]  . Sodium 01/19/2018 133* 135 - 145 mmol/L Final  . Potassium 01/19/2018 4.4  3.5 - 5.1 mmol/L Final  . Chloride 01/19/2018 95* 101 - 111 mmol/L Final  . CO2 01/19/2018 29  22 - 32 mmol/L Final  . Glucose, Bld 01/19/2018 217* 65 - 99 mg/dL Final  . BUN 01/19/2018 30* 6 - 20 mg/dL Final  . Creatinine, Ser 01/19/2018 1.18* 0.44 - 1.00 mg/dL Final  . Calcium 01/19/2018 9.5  8.9 - 10.3 mg/dL Final  . Total Protein 01/19/2018 7.4  6.5 - 8.1 g/dL Final  . Albumin 01/19/2018 3.8  3.5 - 5.0 g/dL Final  . AST 01/19/2018 38  15 - 41 U/L Final  . ALT 01/19/2018 25  14 - 54 U/L Final  . Alkaline  Phosphatase 01/19/2018 99  38 - 126 U/L Final  . Total Bilirubin 01/19/2018 0.9  0.3 - 1.2 mg/dL Final  . GFR calc non Af Amer 01/19/2018 47* >60 mL/min Final  . GFR calc Af Amer 01/19/2018 54* >60 mL/min Final   Comment: (NOTE) The eGFR has been calculated using the CKD EPI equation. This calculation has not been validated in all clinical situations. eGFR's persistently <60 mL/min signify possible Chronic Kidney Disease.   Georgiann Hahn gap 01/19/2018 9  5 - 15 Final   Performed at [facility], [address] [place], [place] [address]  . Ferritin 01/19/2018 75  11 - 307 ng/mL Final   Performed at [facility], [place] [address] [place], [place] [address]  . Iron 01/19/2018 70  28 - 170 ug/dL Final  . TIBC 01/19/2018 333  250 - 450 ug/dL Final  . Saturation Ratios 01/19/2018 21  10.4 - 31.8 % Final  . UIBC 01/19/2018 263  ug/dL Final   Performed at [facility] [address] [place], [place] [address]     Pathology Orders Placed This Encounter  Procedures  . CBC with Differential/Platelet    Standing Status:   Future    Standing Expiration Date:   01/22/2019  . Comprehensive metabolic panel    Standing Status:   Future    Standing Expiration Date:   01/22/2019  . Ferritin    Standing Status:   Future    Standing Expiration Date:   01/22/2019  . CBC with Differential/Platelet    Standing Status:   Future    Standing Expiration Date:   01/22/2019  . Comprehensive metabolic panel  Standing Status:   Future    Standing Expiration Date:   01/22/2019  . Ferritin    Standing Status:   Future    Standing Expiration Date:   01/22/2019       Zoila Shutter MD

## 2018-01-21 NOTE — Patient Instructions (Signed)
[facility] Discharge Instructions Today you saw Dr Walden Field. You will repeat labs in 2 months and follow up in 4 months.   Thank you for choosing [place] at [facility] to provide your oncology and hematology care.  To afford each patient quality time with our provider, please arrive at least 15 minutes before your scheduled appointment time.   If you have a lab appointment with the [facility] please come in thru the  Main Entrance and check in at the main information desk  You need to re-schedule your appointment should you arrive 10 or more minutes late.  We strive to give you quality time with our providers, and arriving late affects you and other patients whose appointments are after yours.  Also, if you no show three or more times for appointments you may be dismissed from the clinic at the providers discretion.     Again, thank you for choosing [facility].  Our hope is that these requests will decrease the amount of time that you wait before being seen by our physicians.       _____________________________________________________________  Should you have questions after your visit to [facility], please contact our office at [phone] between the hours of 8:30 a.m. and 4:30 p.m.  Voicemails left after 4:30 p.m. will not be returned until the following business day.  For prescription refill requests, have your pharmacy contact our office.       Resources For Cancer Patients and their Caregivers ? American Cancer Society: Can assist with transportation, wigs, general needs, runs Look Good Feel Better.        930 694 6512 ? Cancer Care: Provides financial assistance, online support groups, medication/co-pay assistance.  1-800-813-HOPE 220 067 2200) ? [place] Assists [place] cancer patients and their families through emotional , educational and financial support.   (954)420-6632 ? [place] DSS Where to apply for food stamps, Medicaid and utility assistance. 914-017-3998 ? [facility]: Transportation to medical appointments. 254-885-8319 ? Social Security Administration: May apply for disability if have a Stage IV cancer. 272 067 8497 848-299-8132 ? [employer], Disability and Transit Services: Assists with nutrition, care and transit needs. [place] Support Programs:   > Cancer Support Group  2nd Tuesday of the month 1pm-2pm, Journey Room   > Creative Journey  3rd Tuesday of the month 1130am-1pm, Journey Room

## 2018-01-25 NOTE — Progress Notes (Signed)
Late entry: I spoke with patient regarding elevated ammonia. She was not clinically encephalopathic at time of office visit. Chronic diarrhea, so I'm avoiding lactulose. Discussed Xifaxan BID, but she states this was not well tolerated (unsure reaction). MELD Na 15, MELD Na in 2017 13. Would not likely be a transplant candidate due to comorbidities. She is not interested in this consideration at this time.

## 2018-02-03 ENCOUNTER — Ambulatory Visit (INDEPENDENT_AMBULATORY_CARE_PROVIDER_SITE_OTHER): Payer: Medicare Other | Admitting: "Endocrinology

## 2018-02-03 ENCOUNTER — Encounter: Payer: Self-pay | Admitting: "Endocrinology

## 2018-02-03 VITALS — BP 119/60 | HR 83 | Ht 61.0 in | Wt 220.0 lb

## 2018-02-03 DIAGNOSIS — N183 Chronic kidney disease, stage 3 unspecified: Secondary | ICD-10-CM

## 2018-02-03 DIAGNOSIS — E782 Mixed hyperlipidemia: Secondary | ICD-10-CM | POA: Diagnosis not present

## 2018-02-03 DIAGNOSIS — E1122 Type 2 diabetes mellitus with diabetic chronic kidney disease: Secondary | ICD-10-CM | POA: Diagnosis not present

## 2018-02-03 DIAGNOSIS — Z6841 Body Mass Index (BMI) 40.0 and over, adult: Secondary | ICD-10-CM

## 2018-02-03 DIAGNOSIS — E038 Other specified hypothyroidism: Secondary | ICD-10-CM | POA: Diagnosis not present

## 2018-02-03 NOTE — Progress Notes (Signed)
Subjective:    Patient ID: Penny Martinez, female    DOB: 09-10-1950,    Past Medical History:  Diagnosis Date  . Acid reflux   . Anemia   . Arthritis   . C. difficile colitis June/July 2015  . Cancer ([place])    skin cancer  . CHF (congestive heart failure) ([place])   . 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 Negative viral markers 2015  . Complication of anesthesia   . COPD (chronic obstructive pulmonary disease) ([place])   . Diabetes mellitus without complication ([place])   . GERD (gastroesophageal reflux disease)   . History of kidney stones   . Hypercholesteremia   . Hypertension   . Hypothyroidism   . IBS (irritable bowel syndrome)   . Kidney disease   . Liver disease   . Neuropathy   . PONV (postoperative nausea and vomiting)   . Sleep apnea   . Thyroid disease    Past Surgical History:  Procedure Laterality Date  . ABDOMINAL HYSTERECTOMY    . APPENDECTOMY    . CAROTID ARTERY - SUBCLAVIAN ARTERY BYPASS GRAFT Right   . CARPAL TUNNEL RELEASE Right   . CARPAL TUNNEL RELEASE Left 12/03/2016   Procedure: CARPAL TUNNEL RELEASE;  Surgeon: Carole Civil, MD;  Location: [facility];  Service: Orthopedics;  Laterality: Left;  . CATARACT EXTRACTION Bilateral   . CHOLECYSTECTOMY    . COLONOSCOPY N/A 02/19/2014   Dr. Gala Romney: rectal varices, rectal polyp overlying a varix non-manipulated  . ESOPHAGOGASTRODUODENOSCOPY N/A 02/19/2014   Dr. Gala Romney: 4 columns of grade 2 esophageal varices, multiple gastric polyps with largest biopsied and hyperplastic. Negative H.pylori  . GIVENS CAPSULE STUDY N/A 05/04/2014   multiple polypoid-like lesions throughout small bowel, scattered superficial non-bleeding erosions more distal bowel, referred to [facility] for enteroscopy. Capsule study not complete, poor images.   Marland Kitchen TRIGGER FINGER RELEASE Left 12/03/2016   Procedure: RELEASE TRIGGER FINGER/A-1 PULLEY LEFT LONG TRIGGER FINGER RELEASE;  Surgeon: Carole Civil, MD;  Location: [facility];  Service:  Orthopedics;  Laterality: Left;  . urosepsis     Social History   Socioeconomic History  . Marital status: Married    Spouse name: Not on file  . Number of children: Not on file  . Years of education: Not on file  . Highest education level: Not on file  Occupational History  . Not on file  Social Needs  . Financial resource strain: Not on file  . Food insecurity:    Worry: Not on file    Inability: Not on file  . Transportation needs:    Medical: Not on file    Non-medical: Not on file  Tobacco Use  . Smoking status: Former Smoker    Packs/day: 2.00    Years: 35.00    Pack years: 70.00    Last attempt to quit: 05/05/2003    Years since quitting: 14.7  . Smokeless tobacco: Former Systems developer  . Tobacco comment: Quit smoking 11 years ago  Substance and Sexual Activity  . Alcohol use: No  . Drug use: No  . Sexual activity: Not Currently    Birth control/protection: Surgical  Lifestyle  . Physical activity:    Days per week: Not on file    Minutes per session: Not on file  . Stress: Not on file  Relationships  . Social connections:    Talks on phone: Not on file    Gets together: Not on file  Attends religious service: Not on file    Active member of club or organization: Not on file    Attends meetings of clubs or organizations: Not on file    Relationship status: Not on file  Other Topics Concern  . Not on file  Social History Narrative  . Not on file   Outpatient Encounter Medications as of 02/03/2018  Medication Sig  . amitriptyline (ELAVIL) 25 MG tablet Take 25 mg by mouth at bedtime.  Marland Kitchen aspirin 81 MG tablet Take 81 mg by mouth every evening.   . benazepril (LOTENSIN) 20 MG tablet Take 20 mg by mouth every morning.   . Continuous Blood Gluc Sensor (FREESTYLE LIBRE SENSOR SYSTEM) MISC Use one sensor every 10 days.  Marland Kitchen CRANBERRY-VITAMIN C PO Take 2 capsules by mouth at bedtime. Dosage is the 25,200 mg cranberry/vitamin C supplement  . diphenhydrAMINE (SOMINEX) 25 MG  tablet Take 25 mg by mouth at bedtime. Reported on 01/31/2016  . diphenoxylate-atropine (LOMOTIL) 2.5-0.025 MG tablet 1-2 tablets every 4-6 hours as needed for diarrhea. (Patient taking differently: Take 1-2 tablets by mouth every 4 (four) hours as needed for diarrhea or loose stools. )  . donepezil (ARICEPT) 5 MG tablet Take 1 tablet by mouth daily.  Marland Kitchen ezetimibe (ZETIA) 10 MG tablet Take 10 mg by mouth every morning.   . furosemide (LASIX) 80 MG tablet Take 80 mg by mouth every morning.   . gabapentin (NEURONTIN) 100 MG capsule TAKE (1) CAPSULE BY MOUTH THREE TIMES DAILY  . HUMULIN R U-500 KWIKPEN 500 UNIT/ML kwikpen INJECT 130 UNITS S.Q. THREE TIMES DAILY WITH MEALS. (Patient taking differently: INJECT 100 UNITS S.Q. THREE TIMES DAILY WITH MEALS.)  . lansoprazole (PREVACID) 30 MG capsule Take 30 mg by mouth every morning.   Marland Kitchen levothyroxine (SYNTHROID, LEVOTHROID) 100 MCG tablet TAKE 1 TABLET BY MOUTH ONCE DAILY.  Marland Kitchen propranolol (INDERAL) 10 MG tablet TAKE 2 TABLETS BY MOUTH IN THE MORNING AND TAKE ONE TABLET IN THE EVENING (Patient taking differently: Take 20 mg by mouth every morning. )  . spironolactone (ALDACTONE) 25 MG tablet Take 25 mg by mouth daily.  Marland Kitchen ULTICARE SHORT PEN NEEDLES 31G X 8 MM MISC USE AS DIRECTED THREE TIMES DAILY.  Marland Kitchen VICTOZA 18 MG/3ML SOPN INJECT 1.8MG S.Q. ONCE DAILY  . Vitamin D, Ergocalciferol, (DRISDOL) 50000 units CAPS capsule    No facility-administered encounter medications on file as of 02/03/2018.    ALLERGIES: Allergies  Allergen Reactions  . Erythromycin Hives  . Niacin Rash and Shortness Of Breath  . Penicillins Shortness Of Breath    Has patient had a PCN reaction causing immediate rash, facial/tongue/throat swelling, SOB or lightheadedness with hypotension: Yes Has patient had a PCN reaction causing severe rash involving mucus membranes or skin necrosis: No Has patient had a PCN reaction that required hospitalization: No Has patient had a PCN reaction  occurring within the last 10 years: No If all of the above answers are "NO", then may proceed with Cephalosporin use.   . Sulfa Antibiotics Hives  . Statins     Other reaction(s): Muscle Pain Other reaction(s): Muscle Pain  . Codeine Itching   VACCINATION STATUS: Immunization History  Administered Date(s) Administered  . Influenza,inj,Quad PF,6+ Mos 07/24/2016, 06/24/2017    Diabetes  She presents for her follow-up diabetic visit. She has type 2 diabetes mellitus. Onset time: She was diagnosed at approximate age of 62 years. Her disease course has been improving. There are no hypoglycemic associated symptoms. Pertinent negatives  for hypoglycemia include no confusion, headaches, pallor or seizures. Associated symptoms include fatigue. Pertinent negatives for diabetes include no chest pain, no polydipsia, no polyphagia and no polyuria. There are no hypoglycemic complications. Symptoms are improving. Diabetic complications include heart disease, nephropathy and PVD. Risk factors for coronary artery disease include diabetes mellitus, dyslipidemia, obesity, hypertension, sedentary lifestyle and tobacco exposure. Current diabetic treatment includes intensive insulin program. She is compliant with treatment most of the time. Her weight is stable. She is following a generally unhealthy diet. She never participates in exercise. Her home blood glucose trend is fluctuating dramatically (She brought a log showing significant fluctuation in her blood glucose readings including severe hyperglycemia in the range of 40s and 50s happening at random.). Her breakfast blood glucose range is generally 180-200 mg/dl. Her lunch blood glucose range is generally 180-200 mg/dl. Her dinner blood glucose range is generally 180-200 mg/dl. Her bedtime blood glucose range is generally 180-200 mg/dl. Her overall blood glucose range is 180-200 mg/dl. ( ) An ACE inhibitor/angiotensin II receptor blocker is being taken. Eye exam is  current.  Hypertension  This is a chronic problem. The current episode started more than 1 year ago. The problem is controlled. Pertinent negatives include no chest pain, headaches, palpitations or shortness of breath. Risk factors for coronary artery disease include diabetes mellitus, dyslipidemia, obesity, sedentary lifestyle and post-menopausal state. Hypertensive end-organ damage includes PVD.  Hyperlipidemia  This is a chronic problem. The current episode started more than 1 year ago. Exacerbating diseases include diabetes and obesity. Pertinent negatives include no chest pain, myalgias or shortness of breath. Risk factors for coronary artery disease include diabetes mellitus, dyslipidemia, hypertension, a sedentary lifestyle and obesity.    Review of Systems  Constitutional: Positive for fatigue. Negative for unexpected weight change.  HENT: Negative for trouble swallowing and voice change.   Eyes: Negative for visual disturbance.  Respiratory: Negative for cough, shortness of breath and wheezing.   Cardiovascular: Negative for chest pain, palpitations and leg swelling.  Gastrointestinal: Negative for diarrhea, nausea and vomiting.  Endocrine: Negative for cold intolerance, heat intolerance, polydipsia, polyphagia and polyuria.  Musculoskeletal: Negative for arthralgias and myalgias.  Skin: Negative for color change, pallor, rash and wound.  Neurological: Negative for seizures and headaches.  Psychiatric/Behavioral: Negative for confusion and suicidal ideas.    Objective:    BP 119/60   Pulse 83   Ht 5' 1"  (1.549 m)   Wt 220 lb (99.8 kg)   BMI 41.57 kg/m   Wt Readings from Last 3 Encounters:  02/03/18 220 lb (99.8 kg)  01/21/18 221 lb 6.4 oz (100.4 kg)  01/19/18 220 lb 3.2 oz (99.9 kg)    Physical Exam  Constitutional: She is oriented to person, place, and time. She appears well-developed.  HENT:  Head: Normocephalic and atraumatic.  Eyes: EOM are normal.  Neck: Normal  range of motion. Neck supple. No tracheal deviation present. No thyromegaly present.  Cardiovascular: Normal rate and regular rhythm.  Pulmonary/Chest: Effort normal and breath sounds normal.  Abdominal: Soft. Bowel sounds are normal. There is no tenderness. There is no guarding.  Musculoskeletal: Normal range of motion. She exhibits no edema.  Neurological: She is alert and oriented to person, place, and time. No cranial nerve deficit. Coordination normal.  Skin: Skin is warm and dry. No rash noted. No erythema. No pallor.  Psychiatric: She has a normal mood and affect. Judgment normal.  With significant cognitive deficit.    Results for orders placed or performed  during the hospital encounter of 01/19/18  Protime-INR  Result Value Ref Range   Prothrombin Time 14.3 11.4 - 15.2 seconds   INR 1.12   Ammonia  Result Value Ref Range   Ammonia 96 (H) 9 - 35 umol/L   Complete Blood Count (Most recent): Lab Results  Component Value Date   WBC 5.3 01/19/2018   HGB 12.1 01/19/2018   HCT 36.4 01/19/2018   MCV 94.5 01/19/2018   PLT 86 (L) 01/19/2018    Diabetic Labs (most recent): Lab Results  Component Value Date   HGBA1C 8.0 (H) 01/19/2018   HGBA1C 8.2 (H) 11/04/2017   HGBA1C 8.4 (H) 07/20/2017      Assessment & Plan:   1. Uncontrolled type 2 diabetes mellitus with complication, with long-term current use of insulin ([place])  Her diabetes is  complicated by chronic kidney disease and peripheral arterial disease as well as CHF. -She continued to respond to insulin U500. -She returns with better glycemic profile, no hypoglycemia.  - Patient remains at a high risk for more acute and chronic complications of diabetes which include CAD, CVA, CKD, retinopathy, and neuropathy. These are all discussed in detail with the patient.  -Her recent labs show improved A1c of 8%.      Glucose logs and insulin administration records pertaining to this visit,  to be scanned into patient's  records.  Recent labs reviewed.   - I have re-counseled the patient on diet management and weight loss  by adopting a carbohydrate restricted / protein rich  Diet.  -  Suggestion is made for her to avoid simple carbohydrates  from her diet including Cakes, Sweet Desserts / Pastries, Ice Cream, Soda (diet and regular), Sweet Tea, Candies, Chips, Cookies, Store Bought Juices, Alcohol in Excess of  1-2 drinks a day, Artificial Sweeteners, and "Sugar-free" Products. This will help patient to have stable blood glucose profile and potentially avoid unintended weight gain.  - Patient is advised to stick to a routine mealtimes to eat 3 meals  a day and avoid unnecessary snacks ( to snack only to correct hypoglycemia).   - I have approached patient with the following individualized plan to manage diabetes and patient agrees.  -She is clearly at risk of both hypoglycemia and hyperglycemia due to possible cognitive decline.  -She came alone today.   -Only when she is eating and when pre-meal blood glucose readings are above 90 mg/dL she will use insulin U500 100 units before breakfast, 100 units before lunch , and 1000 units before supper.  - She is warned not to inject insulin without proper monitoring. -She is also required not to take insulin when she is not eating and during bedtime.  -She advised to continue Victoza 1.8 mg.  -Patient is encouraged to call clinic for blood glucose levels less than 70 or above 300 mg /dl. -She is advised to continue to wear her CGM device at all times.   -She declined referral to the dietitian.  -Patient is not a candidate for metformin andSGLT2 inhibitors due to CKD.  - Patient specific target  for A1c; LDL, HDL, Triglycerides, and  Waist Circumference were discussed in detail.  2) BP/HTN: Her blood pressure is controlled to target.  I advised her to continue her current blood pressure medications including benazepril 20 mg p.o. daily, Lasix as needed,  spironolactone 25 mg p.o. Daily.  3) Lipids/HPL: No recent lipid panel.  She will be considered for fasting lipid panel on subsequent visits.  4)  Weight/Diet:  exercise, and carbohydrates information provided.  4) Primary hypothyroidism  -Her thyroid function tests are consistent with appropriate replacement.   I advised her to continue levothyroxine 100 mcg p.o. Am.     - We discussed about correct intake of levothyroxine, at fasting, with water, separated by at least 30 minutes from breakfast, and separated by more than 4 hours from calcium, iron, multivitamins, acid reflux medications (PPIs). -Patient is made aware of the fact that thyroid hormone replacement is needed for life, dose to be adjusted by periodic monitoring of thyroid function tests.   6) Chronic Care/Health Maintenance:  -Patient is  on ACEI/ARB and Statin medications and encouraged to continue to follow up with Ophthalmology, Podiatrist at least yearly or according to recommendations, and advised to  stay away from smoking. I have recommended yearly flu vaccine and pneumonia vaccination at least every 5 years; and  sleep for at least 7 hours a day. - She cannot exercise at optimum level. I advised patient to maintain close follow up with her PCP for primary care needs.  - Time spent with the patient: 25 min, of which >50% was spent in reviewing her blood glucose logs , discussing her hypo- and hyper-glycemic episodes, reviewing her current and  previous labs and insulin doses and developing a plan to avoid hypo- and hyper-glycemia. Please refer to Patient Instructions for Blood Glucose Monitoring and Insulin/Medications Dosing Guide"  in media tab for additional information. Penny Martinez participated in the discussions, expressed understanding, and voiced agreement with the above plans.  All questions were answered to her satisfaction. she is encouraged to contact clinic should she have any questions or concerns  prior to her return visit.   Follow up plan: Return in about 3 months (around 05/06/2018) for meter, and logs.  Glade Lloyd, MD Phone: [phone]  Fax: [phone]  This note was partially dictated with voice recognition software. Similar sounding words can be transcribed inadequately or may not  be corrected upon review.  02/03/2018, 2:27 PM

## 2018-02-03 NOTE — Patient Instructions (Signed)

## 2018-02-10 ENCOUNTER — Other Ambulatory Visit: Payer: Self-pay | Admitting: *Deleted

## 2018-02-11 MED ORDER — DIPHENOXYLATE-ATROPINE 2.5-0.025 MG PO TABS: ORAL_TABLET | ORAL | 0 refills | Status: DC

## 2018-02-15 ENCOUNTER — Ambulatory Visit: Payer: Medicare Other | Admitting: Nutrition

## 2018-02-16 ENCOUNTER — Ambulatory Visit (HOSPITAL_COMMUNITY)
Admission: RE | Admit: 2018-02-16 | Discharge: 2018-02-16 | Disposition: A | Discharge status: Home / Self Care | Payer: Medicare Other | Source: Ambulatory Visit | Attending: Gastroenterology | Admitting: Gastroenterology

## 2018-02-16 DIAGNOSIS — Z9889 Other specified postprocedural states: Secondary | ICD-10-CM | POA: Diagnosis not present

## 2018-02-16 DIAGNOSIS — Z9049 Acquired absence of other specified parts of digestive tract: Secondary | ICD-10-CM | POA: Diagnosis not present

## 2018-02-16 DIAGNOSIS — K7469 Other cirrhosis of liver: Secondary | ICD-10-CM | POA: Insufficient documentation

## 2018-02-21 ENCOUNTER — Other Ambulatory Visit: Payer: Self-pay | Admitting: "Endocrinology

## 2018-02-24 NOTE — Progress Notes (Signed)
Cirrhosis, no HCC. Will repeat in 6 months.

## 2018-03-01 ENCOUNTER — Other Ambulatory Visit: Payer: Self-pay | Admitting: "Endocrinology

## 2018-03-01 ENCOUNTER — Telehealth: Payer: Self-pay | Admitting: Internal Medicine

## 2018-03-01 NOTE — Telephone Encounter (Signed)
Spoke with patient and she needs to rescheduled EGD/DIL with [facility] scheduled for 03/09/18. She reports she has a lot going on right now. She is rescheduled for 04/13/18 at 1:00pm. New instructions mailed to her. Called Endo and spoke with Maudie Mercury and made aware of appt change.

## 2018-03-01 NOTE — Telephone Encounter (Signed)
[phone] OR CELL [phone] PLEASE CALL PATIENT, SHE NEEDS TO RESCHEDULE HER PROCEDURE

## 2018-03-11 ENCOUNTER — Other Ambulatory Visit: Payer: Self-pay | Admitting: "Endocrinology

## 2018-03-23 ENCOUNTER — Other Ambulatory Visit (HOSPITAL_COMMUNITY): Payer: Medicare Other

## 2018-04-13 ENCOUNTER — Encounter (HOSPITAL_COMMUNITY): Payer: Self-pay | Admitting: *Deleted

## 2018-04-13 ENCOUNTER — Other Ambulatory Visit: Payer: Self-pay

## 2018-04-13 ENCOUNTER — Encounter (HOSPITAL_COMMUNITY)
Admission: RE | Disposition: A | Discharge status: Home / Self Care | Payer: Self-pay | Source: Ambulatory Visit | Attending: Internal Medicine

## 2018-04-13 ENCOUNTER — Ambulatory Visit (HOSPITAL_COMMUNITY)
Admission: RE | Admit: 2018-04-13 | Discharge: 2018-04-13 | Disposition: A | Discharge status: Home / Self Care | Payer: Medicare Other | Source: Ambulatory Visit | Attending: Internal Medicine | Admitting: Internal Medicine

## 2018-04-13 DIAGNOSIS — I8500 Esophageal varices without bleeding: Secondary | ICD-10-CM | POA: Insufficient documentation

## 2018-04-13 DIAGNOSIS — Z794 Long term (current) use of insulin: Secondary | ICD-10-CM | POA: Insufficient documentation

## 2018-04-13 DIAGNOSIS — K3189 Other diseases of stomach and duodenum: Secondary | ICD-10-CM | POA: Insufficient documentation

## 2018-04-13 DIAGNOSIS — R748 Abnormal levels of other serum enzymes: Secondary | ICD-10-CM

## 2018-04-13 DIAGNOSIS — J449 Chronic obstructive pulmonary disease, unspecified: Secondary | ICD-10-CM | POA: Diagnosis not present

## 2018-04-13 DIAGNOSIS — Z7982 Long term (current) use of aspirin: Secondary | ICD-10-CM | POA: Diagnosis not present

## 2018-04-13 DIAGNOSIS — G4730 Sleep apnea, unspecified: Secondary | ICD-10-CM | POA: Insufficient documentation

## 2018-04-13 DIAGNOSIS — E039 Hypothyroidism, unspecified: Secondary | ICD-10-CM | POA: Insufficient documentation

## 2018-04-13 DIAGNOSIS — K766 Portal hypertension: Secondary | ICD-10-CM | POA: Insufficient documentation

## 2018-04-13 DIAGNOSIS — I509 Heart failure, unspecified: Secondary | ICD-10-CM | POA: Insufficient documentation

## 2018-04-13 DIAGNOSIS — E7800 Pure hypercholesterolemia, unspecified: Secondary | ICD-10-CM | POA: Insufficient documentation

## 2018-04-13 DIAGNOSIS — R1314 Dysphagia, pharyngoesophageal phase: Secondary | ICD-10-CM | POA: Insufficient documentation

## 2018-04-13 DIAGNOSIS — I110 Hypertensive heart disease with heart failure: Secondary | ICD-10-CM | POA: Insufficient documentation

## 2018-04-13 DIAGNOSIS — R1310 Dysphagia, unspecified: Secondary | ICD-10-CM | POA: Diagnosis not present

## 2018-04-13 DIAGNOSIS — K219 Gastro-esophageal reflux disease without esophagitis: Secondary | ICD-10-CM | POA: Diagnosis not present

## 2018-04-13 DIAGNOSIS — Z79899 Other long term (current) drug therapy: Secondary | ICD-10-CM | POA: Diagnosis not present

## 2018-04-13 DIAGNOSIS — Z85828 Personal history of other malignant neoplasm of skin: Secondary | ICD-10-CM | POA: Insufficient documentation

## 2018-04-13 DIAGNOSIS — Z87442 Personal history of urinary calculi: Secondary | ICD-10-CM | POA: Insufficient documentation

## 2018-04-13 DIAGNOSIS — E1140 Type 2 diabetes mellitus with diabetic neuropathy, unspecified: Secondary | ICD-10-CM | POA: Diagnosis not present

## 2018-04-13 DIAGNOSIS — Z87891 Personal history of nicotine dependence: Secondary | ICD-10-CM | POA: Insufficient documentation

## 2018-04-13 HISTORY — PX: ESOPHAGOGASTRODUODENOSCOPY: SHX5428

## 2018-04-13 LAB — COMPREHENSIVE METABOLIC PANEL
ALBUMIN: 3.5 g/dL (ref 3.5–5.0)
ALT: 21 U/L (ref 0–44)
ANION GAP: 10 (ref 5–15)
AST: 33 U/L (ref 15–41)
Alkaline Phosphatase: 63 U/L (ref 38–126)
BILIRUBIN TOTAL: 0.9 mg/dL (ref 0.3–1.2)
BUN: 52 mg/dL — ABNORMAL HIGH (ref 8–23)
CO2: 26 mmol/L (ref 22–32)
Calcium: 9.2 mg/dL (ref 8.9–10.3)
Chloride: 96 mmol/L — ABNORMAL LOW (ref 98–111)
Creatinine, Ser: 1.63 mg/dL — ABNORMAL HIGH (ref 0.44–1.00)
GFR calc Af Amer: 37 mL/min — ABNORMAL LOW (ref >60)
GFR, EST NON AFRICAN AMERICAN: 32 mL/min — AB (ref >60)
Glucose, Bld: 177 mg/dL — ABNORMAL HIGH (ref 70–99)
POTASSIUM: 5.4 mmol/L — AB (ref 3.5–5.1)
Sodium: 132 mmol/L — ABNORMAL LOW (ref 135–145)
TOTAL PROTEIN: 6.7 g/dL (ref 6.5–8.1)

## 2018-04-13 LAB — CBC
HEMATOCRIT: 30 % — AB (ref 36.0–46.0)
Hemoglobin: 9.6 g/dL — ABNORMAL LOW (ref 12.0–15.0)
MCH: 29 pg (ref 26.0–34.0)
MCHC: 32 g/dL (ref 30.0–36.0)
MCV: 90.6 fL (ref 78.0–100.0)
Platelets: 81 10*3/uL — ABNORMAL LOW (ref 150–400)
RBC: 3.31 MIL/uL — ABNORMAL LOW (ref 3.87–5.11)
RDW: 14.1 % (ref 11.5–15.5)
WBC: 3.8 10*3/uL — ABNORMAL LOW (ref 4.0–10.5)

## 2018-04-13 LAB — PROTIME-INR
INR: 1.21
PROTHROMBIN TIME: 15.2 s (ref 11.4–15.2)

## 2018-04-13 LAB — FERRITIN: FERRITIN: 36 ng/mL (ref 11–307)

## 2018-04-13 LAB — GLUCOSE, CAPILLARY: GLUCOSE-CAPILLARY: 173 mg/dL — AB (ref 70–99)

## 2018-04-13 SURGERY — EGD (ESOPHAGOGASTRODUODENOSCOPY)
Anesthesia: Moderate Sedation

## 2018-04-13 MED ORDER — LIDOCAINE VISCOUS HCL 2 % MT SOLN
OROMUCOSAL | Status: DC | PRN
  Administered 2018-04-13: 1 via OROMUCOSAL

## 2018-04-13 MED ORDER — LIDOCAINE VISCOUS HCL 2 % MT SOLN
OROMUCOSAL | Status: AC
  Filled 2018-04-13: qty 15

## 2018-04-13 MED ORDER — MIDAZOLAM HCL 5 MG/5ML IJ SOLN
INTRAMUSCULAR | Status: AC
  Filled 2018-04-13: qty 5

## 2018-04-13 MED ORDER — MEPERIDINE HCL 50 MG/ML IJ SOLN
INTRAMUSCULAR | Status: AC
  Filled 2018-04-13: qty 1

## 2018-04-13 MED ORDER — MIDAZOLAM HCL 5 MG/5ML IJ SOLN
INTRAMUSCULAR | Status: DC | PRN
  Administered 2018-04-13 (×3): 1 mg via INTRAVENOUS

## 2018-04-13 MED ORDER — MEPERIDINE HCL 100 MG/ML IJ SOLN
INTRAMUSCULAR | Status: DC | PRN
  Administered 2018-04-13: 25 mg

## 2018-04-13 MED ORDER — SODIUM CHLORIDE 0.9 % IV SOLN
INTRAVENOUS | Status: DC
  Administered 2018-04-13: 12:00:00 via INTRAVENOUS

## 2018-04-13 MED ORDER — ONDANSETRON HCL 4 MG/2ML IJ SOLN
INTRAMUSCULAR | Status: AC
  Filled 2018-04-13: qty 2

## 2018-04-13 NOTE — Op Note (Signed)
[facility] Patient Name: Penny Martinez Procedure Date: 04/13/2018 11:35 AM MRN: [id number] Date of Birth: 1951/05/30 Attending MD: Norvel Richards , MD CSN: [id number] Age: 67 Admit Type: Outpatient Procedure:                Upper GI endoscopy Indications:              Dysphagia Providers:                Norvel Richards, MD, Janeece Riggers, RN, Aram Candela Referring MD:              Medicines:                Midazolam 3 mg IV, Meperidine 25 mg IV Complications:            No immediate complications. Estimated Blood Loss:     Estimated blood loss was minimal. Procedure:                Pre-Anesthesia Assessment:                           - Prior to the procedure, a History and Physical                            was performed, and patient medications and                            allergies were reviewed. The patient's tolerance of                            previous anesthesia was also reviewed. The risks                            and benefits of the procedure and the sedation                            options and risks were discussed with the patient.                            All questions were answered, and informed consent                            was obtained. Prior Anticoagulants: The patient has                            taken no previous anticoagulant or antiplatelet                            agents. ASA Grade Assessment: III - A patient with                            severe systemic disease. After reviewing the risks  and benefits, the patient was deemed in                            satisfactory condition to undergo the procedure.                           After obtaining informed consent, the endoscope was                            passed under direct vision. Throughout the                            procedure, the patient's blood pressure, pulse, and                            oxygen saturations  were monitored continuously. The                            GIF-[id number] ([id number]) was introduced through the                            mouth, and advanced to the second part of duodenum.                            The upper GI endoscopy was accomplished without                            difficulty. The patient tolerated the procedure                            well. Scope In: 1:02:24 PM Scope Out: 1:06:55 PM Total Procedure Duration: 0 hours 4 minutes 31 seconds  Findings:      Grade III varices were found in the lower third of the esophagus. couple       of overlying red mucosal marks. No esophagitis. Aside from varices,       tubular esophagus was patent throughout its course.      Portal hypertensive gastropathy was found in the entire examined       stomach. multiple hyperplastic-appearing polyps noted throughout stomach       (previously biopsied)      The duodenal bulb and second portion of the duodenum were normal. Impression:               - Grade III esophageal varices. Enlarging varices.                            No dilation performed today.                           - Portal hypertensive gastropathy.                           - Normal duodenal bulb and second portion of the  duodenum.                           - No specimens collected. Moderate Sedation:      Moderate (conscious) sedation was administered by the endoscopy nurse       and supervised by the endoscopist. The following parameters were       monitored: oxygen saturation, heart rate, blood pressure, respiratory       rate, EKG, adequacy of pulmonary ventilation, and response to care.       Total physician intraservice time was 18 minutes. Recommendation:           - Patient has a contact number available for                            emergencies. The signs and symptoms of potential                            delayed complications were discussed with the                             patient. Return to normal activities tomorrow.                            Written discharge instructions were provided to the                            patient.                           - Resume previous diet. Increase lansoprazole to 30                            mg twice daily to maximize treatment of GERD..                           - Continue present medications.                           - Repeat upper endoscopy in 3 months for endoscopic                            band ligation. Would consider proceeding with                            esophageal band ligation in the near future.                            Varices are enlarging. She is no longer on a                            nonselective beta blocker.                           - Return to GI clinic in 3 months. Will need  propofol for EBL in 3 months. Procedure Code(s):        --- Professional ---                           [phone], Esophagogastroduodenoscopy, flexible,                            transoral; diagnostic, including collection of                            specimen(s) by brushing or washing, when performed                            (separate procedure)                           G0500, Moderate sedation services provided by the                            same physician or other qualified health care                            professional performing a gastrointestinal                            endoscopic service that sedation supports,                            requiring the presence of an independent trained                            observer to assist in the monitoring of the                            patient's level of consciousness and physiological                            status; initial 15 minutes of intra-service time;                            patient age 13 years or older (additional time may                            be reported with [phone], as appropriate) Diagnosis Code(s):         --- Professional ---                           I85.00, Esophageal varices without bleeding                           K76.6, Portal hypertension                           K31.89, Other diseases of stomach and duodenum  R13.10, Dysphagia, unspecified CPT copyright 2017 [employer]. All rights reserved. The codes documented in this report are preliminary and upon coder review may  be revised to meet current compliance requirements. Cristopher Estimable. Rourk, MD Norvel Richards, MD 04/13/2018 1:27:07 PM This report has been signed electronically. Number of Addenda: 0

## 2018-04-13 NOTE — Discharge Instructions (Signed)
EGD Discharge instructions Please read the instructions outlined below and refer to this sheet in the next few weeks. These discharge instructions provide you with general information on caring for yourself after you leave the hospital. Your doctor may also give you specific instructions. While your treatment has been planned according to the most current medical practices available, unavoidable complications occasionally occur. If you have any problems or questions after discharge, please call your doctor. ACTIVITY  You may resume your regular activity but move at a slower pace for the next 24 hours.   Take frequent rest periods for the next 24 hours.   Walking will help expel (get rid of) the air and reduce the bloated feeling in your abdomen.   No driving for 24 hours (because of the anesthesia (medicine) used during the test).   You may shower.   Do not sign any important legal documents or operate any machinery for 24 hours (because of the anesthesia used during the test).  NUTRITION  Drink plenty of fluids.   You may resume your normal diet.   Begin with a light meal and progress to your normal diet.   Avoid alcoholic beverages for 24 hours or as instructed by your caregiver.  MEDICATIONS  You may resume your normal medications unless your caregiver tells you otherwise.  WHAT YOU CAN EXPECT TODAY  You may experience abdominal discomfort such as a feeling of fullness or gas pains.  FOLLOW-UP  Your doctor will discuss the results of your test with you.  SEEK IMMEDIATE MEDICAL ATTENTION IF ANY OF THE FOLLOWING OCCUR:  Excessive nausea (feeling sick to your stomach) and/or vomiting.   Severe abdominal pain and distention (swelling).   Trouble swallowing.   Temperature over 101 F (37.8 C).   Rectal bleeding or vomiting of blood.   Increase lansoprazole to 30 mg twice daily  Office visit with [place] in 3 months  Nov 18th at 1:30 pm  At that time, may well benefit  from a repeat EGD with banding of your esophageal varices which have become larger.  Lab work will be done today (INR, CBC, Chem-12 and ferritin)  Further recommendations to follow.

## 2018-04-13 NOTE — H&P (Addendum)
@LOGO @   Primary Care Physician:  [facility]:  Dr. Gala Romney  Pre-Procedure History & Physical: HPI:  Penny Martinez is a 67 y.o. female here for esophageal dysphagia and GERD. ND ESOPHAGEAL VAR  Past Medical History:  Diagnosis Date  . Acid reflux   . Anemia   . Arthritis   . C. difficile colitis June/July 2015  . Cancer ([place])    skin cancer  . CHF (congestive heart failure) ([place])   . 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 Negative viral markers 2015  . Complication of anesthesia   . COPD (chronic obstructive pulmonary disease) ([place])   . Diabetes mellitus without complication ([place])   . GERD (gastroesophageal reflux disease)   . History of kidney stones   . Hypercholesteremia   . Hypertension   . Hypothyroidism   . IBS (irritable bowel syndrome)   . Kidney disease   . Liver disease   . Neuropathy   . PONV (postoperative nausea and vomiting)   . Sleep apnea   . Thyroid disease     Past Surgical History:  Procedure Laterality Date  . ABDOMINAL HYSTERECTOMY    . APPENDECTOMY    . CAROTID ARTERY - SUBCLAVIAN ARTERY BYPASS GRAFT Right   . CARPAL TUNNEL RELEASE Right   . CARPAL TUNNEL RELEASE Left 12/03/2016   Procedure: CARPAL TUNNEL RELEASE;  Surgeon: Carole Civil, MD;  Location: [facility];  Service: Orthopedics;  Laterality: Left;  . CATARACT EXTRACTION Bilateral   . CHOLECYSTECTOMY    . COLONOSCOPY N/A 02/19/2014   Dr. Gala Romney: rectal varices, rectal polyp overlying a varix non-manipulated  . ESOPHAGOGASTRODUODENOSCOPY N/A 02/19/2014   Dr. Gala Romney: 4 columns of grade 2 esophageal varices, multiple gastric polyps with largest biopsied and hyperplastic. Negative H.pylori  . GIVENS CAPSULE STUDY N/A 05/04/2014   multiple polypoid-like lesions throughout small bowel, scattered superficial non-bleeding erosions more distal bowel, referred to [facility] for enteroscopy. Capsule study not complete, poor images.   Marland Kitchen TRIGGER  FINGER RELEASE Left 12/03/2016   Procedure: RELEASE TRIGGER FINGER/A-1 PULLEY LEFT LONG TRIGGER FINGER RELEASE;  Surgeon: Carole Civil, MD;  Location: [facility];  Service: Orthopedics;  Laterality: Left;  . urosepsis      Prior to Admission medications   Medication Sig Start Date End Date Taking? Authorizing Provider  amitriptyline (ELAVIL) 25 MG tablet Take 25 mg by mouth at bedtime.   Yes [provider]  aspirin 81 MG tablet Take 81 mg by mouth every morning.    Yes [provider]  Azelastine HCl 137 MCG/SPRAY SOLN Place 1 spray into both nostrils 2 (two) times daily as needed (for congestion).  03/21/18  Yes [provider]  baclofen (LIORESAL) 10 MG tablet Take 10 mg by mouth at bedtime.   Yes [provider]  benazepril (LOTENSIN) 20 MG tablet Take 20 mg by mouth every morning.    Yes [provider]  CRANBERRY PO Take 1 capsule by mouth at bedtime.   Yes [provider]  diphenhydrAMINE (SOMINEX) 25 MG tablet Take 50 mg by mouth at bedtime.    Yes [provider]  diphenoxylate-atropine (LOMOTIL) 2.5-0.025 MG tablet 1-2 tablets every 4-6 hours as needed for diarrhea. Patient taking differently: Take 1-2 tablets by mouth every 4 (four) hours as needed for diarrhea or loose stools.  02/11/18  Yes Mahala Menghini, PA-C  donepezil (ARICEPT) 5 MG tablet Take 5 mg by mouth at bedtime.  01/12/18  Yes [provider]  ezetimibe (ZETIA) 10 MG tablet Take 10 mg by mouth every morning.    Yes [provider]  furosemide (LASIX) 80 MG tablet Take 80 mg by mouth every morning.    Yes [provider]  gabapentin (NEURONTIN) 100 MG capsule TAKE (1) CAPSULE BY MOUTH THREE TIMES DAILY Patient taking differently: Take 100 mg by mouth in the morning and take 200 mg by mouth at bedtime 02/21/18  Yes Nida, Marella Chimes, MD  HUMULIN R U-500 KWIKPEN 500 UNIT/ML kwikpen INJECT 130 UNITS S.Q. THREE TIMES DAILY WITH  MEALS. Patient taking differently: INJECT 100 UNITS S.Q. THREE TIMES DAILY WITH MEALS. 03/02/18  Yes Nida, Marella Chimes, MD  lansoprazole (PREVACID) 30 MG capsule Take 30 mg by mouth every morning.    Yes [provider]  levothyroxine (SYNTHROID, LEVOTHROID) 100 MCG tablet TAKE 1 TABLET BY MOUTH ONCE DAILY. 01/20/18  Yes Nida, Marella Chimes, MD  Multiple Vitamins-Minerals (CENTRUM SILVER ADULT 50+ PO) Take 1 tablet by mouth daily.   Yes [provider]  spironolactone (ALDACTONE) 25 MG tablet Take 25 mg by mouth daily. 05/19/17  Yes [provider]  VICTOZA 18 MG/3ML SOPN INJECT 1.8MG S.Q. ONCE DAILY Patient taking differently: INJECT 1.8MG S.Q. ONCE DAILY AT BEDTIME 03/14/18  Yes Nida, Marella Chimes, MD  Vitamin D, Ergocalciferol, (DRISDOL) 50000 units CAPS capsule Take 50,000 Units by mouth every 30 (thirty) days.  01/20/18  Yes [provider]  Continuous Blood Gluc Sensor ([place]) MISC Use one sensor every 10 days. Patient not taking: Reported on 04/06/2018 04/27/17   Cassandria Anger, MD  propranolol (INDERAL) 10 MG tablet TAKE 2 TABLETS BY MOUTH IN THE MORNING AND TAKE ONE TABLET IN THE EVENING Patient not taking: Reported on 04/06/2018 09/24/17   Carlis Stable, NP  ULTICARE SHORT PEN NEEDLES 31G X 8 MM MISC USE AS DIRECTED THREE TIMES DAILY. Patient not taking: Reported on 04/06/2018 11/11/17   Cassandria Anger, MD    Allergies as of 01/19/2018 - Review Complete 01/19/2018  Allergen Reaction Noted  . Erythromycin Hives 02/15/2014  . Niacin Rash and Shortness Of Breath 03/05/2014  . Penicillins Shortness Of Breath 02/15/2014  . Sulfa antibiotics Hives 02/15/2014  . Statins  03/05/2014  . Codeine Itching 11/26/2016    Family History  Problem Relation Age of Onset  . Diabetes Mother   . Hypertension Mother   . Kidney failure Mother   . Blindness Mother   . Aneurysm Father   . Colon cancer Neg Hx     Social History    Socioeconomic History  . Marital status: Married    Spouse name: Not on file  . Number of children: Not on file  . Years of education: Not on file  . Highest education level: Not on file  Occupational History  . Not on file  Social Needs  . Financial resource strain: Not on file  . Food insecurity:    Worry: Not on file    Inability: Not on file  . Transportation needs:    Medical: Not on file    Non-medical: Not on file  Tobacco Use  . Smoking status: Former Smoker    Packs/day: 2.00    Years: 35.00    Pack years: 70.00    Last attempt to quit: 05/05/2003    Years since quitting: 14.9  . Smokeless tobacco: Former Systems developer  . Tobacco comment: Quit smoking 11 years ago  Substance and Sexual Activity  . Alcohol use: No  . Drug use: No  . Sexual activity: Not Currently    Birth control/protection: Surgical  Lifestyle  . Physical activity:    Days per week: Not on file    Minutes per session: Not on file  . Stress: Not on file  Relationships  . Social connections:    Talks on phone: Not on file    Gets together: Not on file    Attends religious service: Not on file    Active member of club or organization: Not on file    Attends meetings of clubs or organizations: Not on file    Relationship status: Not on file  . Intimate partner violence:    Fear of current or ex partner: Not on file    Emotionally abused: Not on file    Physically abused: Not on file    Forced sexual activity: Not on file  Other Topics Concern  . Not on file  Social History Narrative  . Not on file    Review of Systems: See HPI, otherwise negative ROS  Physical Exam: BP (!) 129/47   Pulse 82   Temp 98.1 F (36.7 C) (Oral)   Resp 14   Ht 5' 1.5" (1.562 m)   Wt 214 lb (97.1 kg)   SpO2 98%   BMI 39.78 kg/m  General:   Alert,  , pleasant and cooperative in NAD Neck:  Supple; no masses or thyromegaly. No significant cervical adenopathy. Lungs:  Clear throughout to auscultation.   No  wheezes, crackles, or rhonchi. No acute distress. Heart:  Regular rate and rhythm; no murmurs, clicks, rubs,  or gallops. Abdomen: Non-distended, normal bowel sounds.  Soft and nontender without appreciable mass or hepatosplenomegaly.  Pulses:  Normal pulses noted. Extremities:  Without clubbing or edema.  Impression/Plan:  67 year old lady with [place]/cirrhosis.  Intermittent esophageal dysphagia. GERD controlled on lansoprazole. Plan for EGD with possible esophageal dilation as feasible/appropriate.  The risks, benefits, limitations, alternatives and imponderables have been reviewed with the patient. Potential for esophageal dilation, biopsy, etc. have also been reviewed.  Questions have been answered. All parties agreeable.     Notice: This dictation was prepared with Dragon dictation along with smaller phrase technology. Any transcriptional errors that result from this process are unintentional and may not be corrected upon review.

## 2018-04-15 ENCOUNTER — Other Ambulatory Visit: Payer: Self-pay | Admitting: "Endocrinology

## 2018-04-18 ENCOUNTER — Encounter (HOSPITAL_COMMUNITY): Payer: Self-pay | Admitting: Internal Medicine

## 2018-04-18 ENCOUNTER — Other Ambulatory Visit (HOSPITAL_COMMUNITY)
Admission: RE | Admit: 2018-04-18 | Discharge: 2018-04-18 | Disposition: A | Discharge status: Home / Self Care | Payer: Medicare Other | Source: Ambulatory Visit | Attending: Internal Medicine | Admitting: Internal Medicine

## 2018-04-18 ENCOUNTER — Other Ambulatory Visit (HOSPITAL_COMMUNITY)
Admission: RE | Admit: 2018-04-18 | Discharge: 2018-04-18 | Disposition: A | Discharge status: Home / Self Care | Payer: Medicare Other | Source: Ambulatory Visit | Attending: "Endocrinology | Admitting: "Endocrinology

## 2018-04-18 DIAGNOSIS — E1122 Type 2 diabetes mellitus with diabetic chronic kidney disease: Secondary | ICD-10-CM | POA: Insufficient documentation

## 2018-04-18 DIAGNOSIS — R748 Abnormal levels of other serum enzymes: Secondary | ICD-10-CM

## 2018-04-18 DIAGNOSIS — N183 Chronic kidney disease, stage 3 (moderate): Secondary | ICD-10-CM | POA: Diagnosis present

## 2018-04-18 LAB — COMPREHENSIVE METABOLIC PANEL
ALT: 20 U/L (ref 0–44)
AST: 29 U/L (ref 15–41)
Albumin: 3.6 g/dL (ref 3.5–5.0)
Alkaline Phosphatase: 79 U/L (ref 38–126)
Anion gap: 9 (ref 5–15)
BUN: 46 mg/dL — AB (ref 8–23)
CO2: 28 mmol/L (ref 22–32)
CREATININE: 1.47 mg/dL — AB (ref 0.44–1.00)
Calcium: 9.5 mg/dL (ref 8.9–10.3)
Chloride: 96 mmol/L — ABNORMAL LOW (ref 98–111)
GFR, EST AFRICAN AMERICAN: 42 mL/min — AB (ref >60)
GFR, EST NON AFRICAN AMERICAN: 36 mL/min — AB (ref >60)
Glucose, Bld: 181 mg/dL — ABNORMAL HIGH (ref 70–99)
POTASSIUM: 4.6 mmol/L (ref 3.5–5.1)
Sodium: 133 mmol/L — ABNORMAL LOW (ref 135–145)
Total Bilirubin: 0.6 mg/dL (ref 0.3–1.2)
Total Protein: 6.9 g/dL (ref 6.5–8.1)

## 2018-04-18 LAB — HEMOGLOBIN A1C
HEMOGLOBIN A1C: 7.1 % — AB (ref 4.8–5.6)
Mean Plasma Glucose: 157.07 mg/dL

## 2018-04-20 ENCOUNTER — Telehealth: Payer: Self-pay

## 2018-04-20 NOTE — Telephone Encounter (Signed)
PA is no longer needed. Medication for Lansoprazole is covered for 30 pills monthly.

## 2018-04-20 NOTE — Telephone Encounter (Signed)
PA for Lansoprazole 30 mg was started on 04/20/18 on [email or web address]. Waiting on an approval or denial.

## 2018-05-10 ENCOUNTER — Ambulatory Visit (INDEPENDENT_AMBULATORY_CARE_PROVIDER_SITE_OTHER): Payer: Medicare Other | Admitting: "Endocrinology

## 2018-05-10 ENCOUNTER — Encounter: Payer: Self-pay | Admitting: "Endocrinology

## 2018-05-10 VITALS — BP 144/65 | HR 98 | Ht 61.0 in | Wt 224.0 lb

## 2018-05-10 DIAGNOSIS — E038 Other specified hypothyroidism: Secondary | ICD-10-CM | POA: Diagnosis not present

## 2018-05-10 DIAGNOSIS — N183 Chronic kidney disease, stage 3 unspecified: Secondary | ICD-10-CM

## 2018-05-10 DIAGNOSIS — E1122 Type 2 diabetes mellitus with diabetic chronic kidney disease: Secondary | ICD-10-CM

## 2018-05-10 DIAGNOSIS — E782 Mixed hyperlipidemia: Secondary | ICD-10-CM | POA: Diagnosis not present

## 2018-05-10 NOTE — Patient Instructions (Signed)

## 2018-05-10 NOTE — Progress Notes (Signed)
Endocrinology follow-up note  Subjective:    Patient ID: Penny Martinez, female    DOB: 08-19-1951,    Past Medical History:  Diagnosis Date  . Acid reflux   . Anemia   . Arthritis   . C. difficile colitis June/July 2015  . Cancer ([place])    skin cancer  . CHF (congestive heart failure) ([place])   . 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 Negative viral markers 2015  . Complication of anesthesia   . COPD (chronic obstructive pulmonary disease) ([place])   . Diabetes mellitus without complication ([place])   . GERD (gastroesophageal reflux disease)   . History of kidney stones   . Hypercholesteremia   . Hypertension   . Hypothyroidism   . IBS (irritable bowel syndrome)   . Kidney disease   . Liver disease   . Neuropathy   . PONV (postoperative nausea and vomiting)   . Sleep apnea   . Thyroid disease    Past Surgical History:  Procedure Laterality Date  . ABDOMINAL HYSTERECTOMY    . APPENDECTOMY    . CAROTID ARTERY - SUBCLAVIAN ARTERY BYPASS GRAFT Right   . CARPAL TUNNEL RELEASE Right   . CARPAL TUNNEL RELEASE Left 12/03/2016   Procedure: CARPAL TUNNEL RELEASE;  Surgeon: Carole Civil, MD;  Location: [facility];  Service: Orthopedics;  Laterality: Left;  . CATARACT EXTRACTION Bilateral   . CHOLECYSTECTOMY    . COLONOSCOPY N/A 02/19/2014   Dr. Gala Romney: rectal varices, rectal polyp overlying a varix non-manipulated  . ESOPHAGOGASTRODUODENOSCOPY N/A 02/19/2014   Dr. Gala Romney: 4 columns of grade 2 esophageal varices, multiple gastric polyps with largest biopsied and hyperplastic. Negative H.pylori  . ESOPHAGOGASTRODUODENOSCOPY N/A 04/13/2018   Procedure: ESOPHAGOGASTRODUODENOSCOPY (EGD);  Surgeon: Daneil Dolin, MD;  Location: [facility] SUITE;  Service: Endoscopy;  Laterality: N/A;  1:00PM-rescheduled to 8/7  . GIVENS CAPSULE STUDY N/A 05/04/2014   multiple polypoid-like lesions throughout small bowel, scattered superficial non-bleeding erosions more distal bowel, referred to [facility] for  enteroscopy. Capsule study not complete, poor images.   Marland Kitchen TRIGGER FINGER RELEASE Left 12/03/2016   Procedure: RELEASE TRIGGER FINGER/A-1 PULLEY LEFT LONG TRIGGER FINGER RELEASE;  Surgeon: Carole Civil, MD;  Location: [facility];  Service: Orthopedics;  Laterality: Left;  . urosepsis     Social History   Socioeconomic History  . Marital status: Married    Spouse name: Not on file  . Number of children: Not on file  . Years of education: Not on file  . Highest education level: Not on file  Occupational History  . Not on file  Social Needs  . Financial resource strain: Not on file  . Food insecurity:    Worry: Not on file    Inability: Not on file  . Transportation needs:    Medical: Not on file    Non-medical: Not on file  Tobacco Use  . Smoking status: Former Smoker    Packs/day: 2.00    Years: 35.00    Pack years: 70.00    Last attempt to quit: 05/05/2003    Years since quitting: 15.0  . Smokeless tobacco: Former Systems developer  . Tobacco comment: Quit smoking 11 years ago  Substance and Sexual Activity  . Alcohol use: No  . Drug use: No  . Sexual activity: Not Currently    Birth control/protection: Surgical  Lifestyle  . Physical activity:    Days per week: Not on file    Minutes per session: Not on  file  . Stress: Not on file  Relationships  . Social connections:    Talks on phone: Not on file    Gets together: Not on file    Attends religious service: Not on file    Active member of club or organization: Not on file    Attends meetings of clubs or organizations: Not on file    Relationship status: Not on file  Other Topics Concern  . Not on file  Social History Narrative  . Not on file   Outpatient Encounter Medications as of 05/10/2018  Medication Sig  . amitriptyline (ELAVIL) 25 MG tablet Take 25 mg by mouth at bedtime.  Marland Kitchen aspirin 81 MG tablet Take 81 mg by mouth every morning.   . Azelastine HCl 137 MCG/SPRAY SOLN Place 1 spray into both nostrils 2 (two) times  daily as needed (for congestion).   . baclofen (LIORESAL) 10 MG tablet Take 10 mg by mouth at bedtime.  . benazepril (LOTENSIN) 20 MG tablet Take 20 mg by mouth every morning.   Marland Kitchen CRANBERRY PO Take 1 capsule by mouth at bedtime.  . diphenhydrAMINE (SOMINEX) 25 MG tablet Take 50 mg by mouth at bedtime.   . diphenoxylate-atropine (LOMOTIL) 2.5-0.025 MG tablet 1-2 tablets every 4-6 hours as needed for diarrhea. (Patient taking differently: Take 1-2 tablets by mouth every 4 (four) hours as needed for diarrhea or loose stools. )  . donepezil (ARICEPT) 5 MG tablet Take 5 mg by mouth at bedtime.   Marland Kitchen ezetimibe (ZETIA) 10 MG tablet Take 10 mg by mouth every morning.   . furosemide (LASIX) 80 MG tablet Take 80 mg by mouth every morning.   . gabapentin (NEURONTIN) 100 MG capsule TAKE (1) CAPSULE BY MOUTH THREE TIMES DAILY  . HUMULIN R U-500 KWIKPEN 500 UNIT/ML kwikpen INJECT 130 UNITS S.Q. THREE TIMES DAILY WITH MEALS. (Patient taking differently: 100 Units 3 (three) times daily with meals. )  . lansoprazole (PREVACID) 30 MG capsule Take 30 mg by mouth every morning.   Marland Kitchen levothyroxine (SYNTHROID, LEVOTHROID) 100 MCG tablet TAKE 1 TABLET BY MOUTH ONCE DAILY.  . Multiple Vitamins-Minerals (CENTRUM SILVER ADULT 50+ PO) Take 1 tablet by mouth daily.  Marland Kitchen spironolactone (ALDACTONE) 25 MG tablet Take 25 mg by mouth daily.  Marland Kitchen VICTOZA 18 MG/3ML SOPN INJECT 1.8MG S.Q. ONCE DAILY (Patient taking differently: INJECT 1.8MG S.Q. ONCE DAILY AT BEDTIME)  . Vitamin D, Ergocalciferol, (DRISDOL) 50000 units CAPS capsule Take 50,000 Units by mouth every 30 (thirty) days.    No facility-administered encounter medications on file as of 05/10/2018.    ALLERGIES: Allergies  Allergen Reactions  . Erythromycin Hives  . Niacin Rash and Shortness Of Breath  . Penicillins Shortness Of Breath and Other (See Comments)    Has patient had a PCN reaction causing immediate rash, facial/tongue/throat swelling, SOB or lightheadedness with  hypotension: Yes Has patient had a PCN reaction causing severe rash involving mucus membranes or skin necrosis: No Has patient had a PCN reaction that required hospitalization: No Has patient had a PCN reaction occurring within the last 10 years: No If all of the above answers are "NO", then may proceed with Cephalosporin use.   . Sulfa Antibiotics Hives  . Statins Other (See Comments)    Muscle Pain  . Codeine Itching   VACCINATION STATUS: Immunization History  Administered Date(s) Administered  . Influenza,inj,Quad PF,6+ Mos 07/24/2016, 06/24/2017    Diabetes  She presents for her follow-up diabetic visit. She has type 2  diabetes mellitus. Onset time: She was diagnosed at approximate age of 26 years. Her disease course has been improving. There are no hypoglycemic associated symptoms. Pertinent negatives for hypoglycemia include no confusion, headaches, pallor or seizures. Associated symptoms include fatigue. Pertinent negatives for diabetes include no chest pain, no polydipsia, no polyphagia and no polyuria. There are no hypoglycemic complications. Symptoms are improving. Diabetic complications include heart disease, nephropathy and PVD. Risk factors for coronary artery disease include diabetes mellitus, dyslipidemia, obesity, hypertension, sedentary lifestyle and tobacco exposure. Current diabetic treatment includes intensive insulin program. She is compliant with treatment most of the time. Her weight is fluctuating minimally. She is following a generally unhealthy diet. She never participates in exercise. Her home blood glucose trend is decreasing steadily. Her breakfast blood glucose range is generally 140-180 mg/dl. Her lunch blood glucose range is generally 140-180 mg/dl. Her dinner blood glucose range is generally 140-180 mg/dl. Her bedtime blood glucose range is generally 140-180 mg/dl. Her overall blood glucose range is 140-180 mg/dl. An ACE inhibitor/angiotensin II receptor blocker is  being taken. Eye exam is current.  Hypertension  This is a chronic problem. The current episode started more than 1 year ago. The problem is controlled. Pertinent negatives include no chest pain, headaches, palpitations or shortness of breath. Risk factors for coronary artery disease include diabetes mellitus, dyslipidemia, obesity, sedentary lifestyle and post-menopausal state. Hypertensive end-organ damage includes PVD.  Hyperlipidemia  This is a chronic problem. The current episode started more than 1 year ago. Exacerbating diseases include diabetes and obesity. Pertinent negatives include no chest pain, myalgias or shortness of breath. Risk factors for coronary artery disease include diabetes mellitus, dyslipidemia, hypertension, a sedentary lifestyle and obesity.    Review of Systems  Constitutional: Positive for fatigue. Negative for unexpected weight change.  HENT: Negative for trouble swallowing and voice change.   Eyes: Negative for visual disturbance.  Respiratory: Negative for cough, shortness of breath and wheezing.   Cardiovascular: Negative for chest pain, palpitations and leg swelling.  Gastrointestinal: Negative for diarrhea, nausea and vomiting.  Endocrine: Negative for cold intolerance, heat intolerance, polydipsia, polyphagia and polyuria.  Musculoskeletal: Negative for arthralgias and myalgias.  Skin: Negative for color change, pallor, rash and wound.  Neurological: Negative for seizures and headaches.  Psychiatric/Behavioral: Negative for confusion and suicidal ideas.    Objective:    BP (!) 144/65   Pulse 98   Ht 5' 1"  (1.549 m)   Wt 224 lb (101.6 kg)   BMI 42.32 kg/m   Wt Readings from Last 3 Encounters:  05/10/18 224 lb (101.6 kg)  04/13/18 214 lb (97.1 kg)  02/03/18 220 lb (99.8 kg)    Physical Exam  Constitutional: She is oriented to person, place, and time. She appears well-developed.  HENT:  Head: Normocephalic and atraumatic.  Eyes: EOM are normal.   Neck: Normal range of motion. Neck supple. No tracheal deviation present. No thyromegaly present.  Cardiovascular: Normal rate and regular rhythm.  Pulmonary/Chest: Effort normal and breath sounds normal.  Abdominal: Soft. Bowel sounds are normal. There is no tenderness. There is no guarding.  Musculoskeletal: Normal range of motion. She exhibits no edema.  Neurological: She is alert and oriented to person, place, and time. No cranial nerve deficit. Coordination normal.  Skin: Skin is warm and dry. No rash noted. No erythema. No pallor.  Psychiatric: She has a normal mood and affect. Judgment normal.  With significant cognitive deficit.    Results for orders placed or performed during the  hospital encounter of 04/18/18  Hemoglobin A1c  Result Value Ref Range   Hgb A1c MFr Bld 7.1 (H) 4.8 - 5.6 %   Mean Plasma Glucose 157.07 mg/dL  Comprehensive metabolic panel  Result Value Ref Range   Sodium 133 (L) 135 - 145 mmol/L   Potassium 4.6 3.5 - 5.1 mmol/L   Chloride 96 (L) 98 - 111 mmol/L   CO2 28 22 - 32 mmol/L   Glucose, Bld 181 (H) 70 - 99 mg/dL   BUN 46 (H) 8 - 23 mg/dL   Creatinine, Ser 1.47 (H) 0.44 - 1.00 mg/dL   Calcium 9.5 8.9 - 10.3 mg/dL   Total Protein 6.9 6.5 - 8.1 g/dL   Albumin 3.6 3.5 - 5.0 g/dL   AST 29 15 - 41 U/L   ALT 20 0 - 44 U/L   Alkaline Phosphatase 79 38 - 126 U/L   Total Bilirubin 0.6 0.3 - 1.2 mg/dL   GFR calc non Af Amer 36 (L) >60 mL/min   GFR calc Af Amer 42 (L) >60 mL/min   Anion gap 9 5 - 15   Complete Blood Count (Most recent): Lab Results  Component Value Date   WBC 3.8 (L) 04/13/2018   HGB 9.6 (L) 04/13/2018   HCT 30.0 (L) 04/13/2018   MCV 90.6 04/13/2018   PLT 81 (L) 04/13/2018    Diabetic Labs (most recent): Lab Results  Component Value Date   HGBA1C 7.1 (H) 04/18/2018   HGBA1C 8.0 (H) 01/19/2018   HGBA1C 8.2 (H) 11/04/2017      Assessment & Plan:   1. Uncontrolled type 2 diabetes mellitus with complication, with long-term  current use of insulin ([place])  Her diabetes is  complicated by chronic kidney disease and peripheral arterial disease as well as CHF. -She continued to respond to insulin U500. -She returns with better glycemic profile, no hypoglycemia.  Her A1c has improved to 7.1% from 8% during her last visit. - She remains at a high risk for more acute and chronic complications of diabetes which include CAD, CVA, CKD, retinopathy, and neuropathy. These are all discussed in detail with the patient.    Glucose logs and insulin administration records pertaining to this visit,  to be scanned into patient's records.  Recent labs reviewed.   - I have re-counseled the patient on diet management and weight loss  by adopting a carbohydrate restricted / protein rich  Diet. -  Suggestion is made for her to avoid simple carbohydrates  from her diet including Cakes, Sweet Desserts / Pastries, Ice Cream, Soda (diet and regular), Sweet Tea, Candies, Chips, Cookies, Store Bought Juices, Alcohol in Excess of  1-2 drinks a day, Artificial Sweeteners, and "Sugar-free" Products. This will help patient to have stable blood glucose profile and potentially avoid unintended weight gain.  - Patient is advised to stick to a routine mealtimes to eat 3 meals  a day and avoid unnecessary snacks ( to snack only to correct hypoglycemia).  - I have approached patient with the following individualized plan to manage diabetes and patient agrees.  -She is  at risk of both hypoglycemia and hyperglycemia due to possible cognitive decline.   -Only when she is eating and when pre-meal blood glucose readings are above 90 mg/dL she will use insulin U500 100 units before breakfast, 100 units before lunch , and 100 units before supper.  - She is warned not to inject insulin without proper monitoring.  She is advised to wear her CGM  device at all times.  -She advised to continue Victoza 1.8 mg.  -Patient is encouraged to call clinic for blood  glucose levels less than 70 or above 300 mg /dl. -She declined referral to the dietitian.  -Patient is not a candidate for metformin andSGLT2 inhibitors due to CKD.  - Patient specific target  for A1c; LDL, HDL, Triglycerides, and  Waist Circumference were discussed in detail.  2) BP/HTN: Her blood pressure is not controlled to target.  She is advised to continue her current blood pressure medications including benazepril 20 mg p.o. daily, Lasix as needed, spironolactone 25 mg p.o. Daily.  3) Lipids/HPL: She does not tolerate statins.  She is on Zetia 10 mg p.o. Daily.  She will be considered for fasting lipid panel on subsequent visits.     4)  Weight/Diet:  exercise, and carbohydrates information provided.  4) Primary hypothyroidism  -Her thyroid function tests are consistent with appropriate replacement.   I advised her to continue levothyroxine 100 mcg p.o.  daily before breakfast.   - We discussed about correct intake of levothyroxine, at fasting, with water, separated by at least 30 minutes from breakfast, and separated by more than 4 hours from calcium, iron, multivitamins, acid reflux medications (PPIs). -Patient is made aware of the fact that thyroid hormone replacement is needed for life, dose to be adjusted by periodic monitoring of thyroid function tests.  6) Chronic Care/Health Maintenance:  -Patient is  on ACEI/ARB and Statin medications and encouraged to continue to follow up with Ophthalmology, Podiatrist at least yearly or according to recommendations, and advised to  stay away from smoking. I have recommended yearly flu vaccine and pneumonia vaccination at least every 5 years; and  sleep for at least 7 hours a day. - She cannot exercise at optimum level. I advised patient to maintain close follow up with her PCP for primary care needs.  - Time spent with the patient: 25 min, of which >50% was spent in reviewing her blood glucose logs , discussing her hypo- and  hyper-glycemic episodes, reviewing her current and  previous labs and insulin doses and developing a plan to avoid hypo- and hyper-glycemia. Please refer to Patient Instructions for Blood Glucose Monitoring and Insulin/Medications Dosing Guide"  in media tab for additional information. Penny Martinez participated in the discussions, expressed understanding, and voiced agreement with the above plans.  All questions were answered to her satisfaction. she is encouraged to contact clinic should she have any questions or concerns prior to her return visit.  Follow up plan: Return in about 3 months (around 08/09/2018) for Meter, and Logs.  Glade Lloyd, MD Phone: [phone]  Fax: [phone]  This note was partially dictated with voice recognition software. Similar sounding words can be transcribed inadequately or may not  be corrected upon review.  05/10/2018, 4:25 PM

## 2018-05-17 ENCOUNTER — Inpatient Hospital Stay (HOSPITAL_COMMUNITY): Payer: Medicare Other | Attending: Internal Medicine

## 2018-05-17 DIAGNOSIS — D500 Iron deficiency anemia secondary to blood loss (chronic): Secondary | ICD-10-CM | POA: Diagnosis present

## 2018-05-17 DIAGNOSIS — D696 Thrombocytopenia, unspecified: Secondary | ICD-10-CM | POA: Insufficient documentation

## 2018-05-17 LAB — COMPREHENSIVE METABOLIC PANEL
ALBUMIN: 3.6 g/dL (ref 3.5–5.0)
ALK PHOS: 126 U/L (ref 38–126)
ALT: 22 U/L (ref 0–44)
ANION GAP: 9 (ref 5–15)
AST: 30 U/L (ref 15–41)
BILIRUBIN TOTAL: 0.6 mg/dL (ref 0.3–1.2)
BUN: 26 mg/dL — ABNORMAL HIGH (ref 8–23)
CALCIUM: 9 mg/dL (ref 8.9–10.3)
CO2: 29 mmol/L (ref 22–32)
CREATININE: 1.36 mg/dL — AB (ref 0.44–1.00)
Chloride: 93 mmol/L — ABNORMAL LOW (ref 98–111)
GFR calc Af Amer: 46 mL/min — ABNORMAL LOW (ref >60)
GFR calc non Af Amer: 40 mL/min — ABNORMAL LOW (ref >60)
Glucose, Bld: 290 mg/dL — ABNORMAL HIGH (ref 70–99)
Potassium: 4.8 mmol/L (ref 3.5–5.1)
SODIUM: 131 mmol/L — AB (ref 135–145)
Total Protein: 7 g/dL (ref 6.5–8.1)

## 2018-05-17 LAB — CBC WITH DIFFERENTIAL/PLATELET
BASOS PCT: 0 %
Basophils Absolute: 0 10*3/uL (ref 0.0–0.1)
EOS ABS: 0.1 10*3/uL (ref 0.0–0.7)
Eosinophils Relative: 1 %
HEMATOCRIT: 32.9 % — AB (ref 36.0–46.0)
HEMOGLOBIN: 10.1 g/dL — AB (ref 12.0–15.0)
Lymphocytes Relative: 20 %
Lymphs Abs: 1 10*3/uL (ref 0.7–4.0)
MCH: 26.6 pg (ref 26.0–34.0)
MCHC: 30.7 g/dL (ref 30.0–36.0)
MCV: 86.8 fL (ref 78.0–100.0)
Monocytes Absolute: 0.5 10*3/uL (ref 0.1–1.0)
Monocytes Relative: 9 %
NEUTROS ABS: 3.5 10*3/uL (ref 1.7–7.7)
NEUTROS PCT: 70 %
Platelets: 93 10*3/uL — ABNORMAL LOW (ref 150–400)
RBC: 3.79 MIL/uL — AB (ref 3.87–5.11)
RDW: 14.2 % (ref 11.5–15.5)
WBC: 5 10*3/uL (ref 4.0–10.5)

## 2018-05-17 LAB — FERRITIN: Ferritin: 9 ng/mL — ABNORMAL LOW (ref 11–307)

## 2018-05-23 ENCOUNTER — Other Ambulatory Visit: Payer: Self-pay

## 2018-05-23 ENCOUNTER — Other Ambulatory Visit (HOSPITAL_COMMUNITY): Payer: Self-pay

## 2018-05-23 ENCOUNTER — Emergency Department (HOSPITAL_COMMUNITY): Payer: Medicare Other

## 2018-05-23 ENCOUNTER — Encounter (HOSPITAL_COMMUNITY): Payer: Self-pay | Admitting: Emergency Medicine

## 2018-05-23 ENCOUNTER — Inpatient Hospital Stay (HOSPITAL_COMMUNITY)
Admission: EM | Admit: 2018-05-23 | Discharge: 2018-05-25 | DRG: 640 | Disposition: A | Discharge status: Home / Self Care | Payer: Medicare Other | Attending: Internal Medicine | Admitting: Internal Medicine

## 2018-05-23 DIAGNOSIS — N183 Chronic kidney disease, stage 3 unspecified: Secondary | ICD-10-CM

## 2018-05-23 DIAGNOSIS — Z7982 Long term (current) use of aspirin: Secondary | ICD-10-CM

## 2018-05-23 DIAGNOSIS — Z794 Long term (current) use of insulin: Secondary | ICD-10-CM

## 2018-05-23 DIAGNOSIS — Z90710 Acquired absence of both cervix and uterus: Secondary | ICD-10-CM

## 2018-05-23 DIAGNOSIS — K7581 Nonalcoholic steatohepatitis (NASH): Secondary | ICD-10-CM | POA: Diagnosis present

## 2018-05-23 DIAGNOSIS — Z882 Allergy status to sulfonamides status: Secondary | ICD-10-CM

## 2018-05-23 DIAGNOSIS — Z9841 Cataract extraction status, right eye: Secondary | ICD-10-CM

## 2018-05-23 DIAGNOSIS — R739 Hyperglycemia, unspecified: Secondary | ICD-10-CM

## 2018-05-23 DIAGNOSIS — E861 Hypovolemia: Secondary | ICD-10-CM | POA: Diagnosis not present

## 2018-05-23 DIAGNOSIS — I10 Essential (primary) hypertension: Secondary | ICD-10-CM | POA: Diagnosis present

## 2018-05-23 DIAGNOSIS — Z87891 Personal history of nicotine dependence: Secondary | ICD-10-CM

## 2018-05-23 DIAGNOSIS — E871 Hypo-osmolality and hyponatremia: Principal | ICD-10-CM | POA: Diagnosis present

## 2018-05-23 DIAGNOSIS — I130 Hypertensive heart and chronic kidney disease with heart failure and stage 1 through stage 4 chronic kidney disease, or unspecified chronic kidney disease: Secondary | ICD-10-CM | POA: Diagnosis present

## 2018-05-23 DIAGNOSIS — Z87442 Personal history of urinary calculi: Secondary | ICD-10-CM

## 2018-05-23 DIAGNOSIS — Z888 Allergy status to other drugs, medicaments and biological substances status: Secondary | ICD-10-CM

## 2018-05-23 DIAGNOSIS — R531 Weakness: Secondary | ICD-10-CM

## 2018-05-23 DIAGNOSIS — Z85828 Personal history of other malignant neoplasm of skin: Secondary | ICD-10-CM

## 2018-05-23 DIAGNOSIS — Z8249 Family history of ischemic heart disease and other diseases of the circulatory system: Secondary | ICD-10-CM

## 2018-05-23 DIAGNOSIS — Z881 Allergy status to other antibiotic agents status: Secondary | ICD-10-CM

## 2018-05-23 DIAGNOSIS — IMO0002 Reserved for concepts with insufficient information to code with codable children: Secondary | ICD-10-CM | POA: Diagnosis present

## 2018-05-23 DIAGNOSIS — I509 Heart failure, unspecified: Secondary | ICD-10-CM

## 2018-05-23 DIAGNOSIS — E1122 Type 2 diabetes mellitus with diabetic chronic kidney disease: Secondary | ICD-10-CM | POA: Diagnosis present

## 2018-05-23 DIAGNOSIS — M1990 Unspecified osteoarthritis, unspecified site: Secondary | ICD-10-CM | POA: Diagnosis present

## 2018-05-23 DIAGNOSIS — D696 Thrombocytopenia, unspecified: Secondary | ICD-10-CM | POA: Diagnosis present

## 2018-05-23 DIAGNOSIS — Z79890 Hormone replacement therapy (postmenopausal): Secondary | ICD-10-CM

## 2018-05-23 DIAGNOSIS — E118 Type 2 diabetes mellitus with unspecified complications: Secondary | ICD-10-CM

## 2018-05-23 DIAGNOSIS — R300 Dysuria: Secondary | ICD-10-CM | POA: Diagnosis not present

## 2018-05-23 DIAGNOSIS — Z23 Encounter for immunization: Secondary | ICD-10-CM

## 2018-05-23 DIAGNOSIS — Z885 Allergy status to narcotic agent status: Secondary | ICD-10-CM

## 2018-05-23 DIAGNOSIS — D631 Anemia in chronic kidney disease: Secondary | ICD-10-CM | POA: Diagnosis present

## 2018-05-23 DIAGNOSIS — Z9842 Cataract extraction status, left eye: Secondary | ICD-10-CM

## 2018-05-23 DIAGNOSIS — E038 Other specified hypothyroidism: Secondary | ICD-10-CM | POA: Diagnosis present

## 2018-05-23 DIAGNOSIS — Z87440 Personal history of urinary (tract) infections: Secondary | ICD-10-CM

## 2018-05-23 DIAGNOSIS — K589 Irritable bowel syndrome without diarrhea: Secondary | ICD-10-CM | POA: Diagnosis present

## 2018-05-23 DIAGNOSIS — K219 Gastro-esophageal reflux disease without esophagitis: Secondary | ICD-10-CM | POA: Diagnosis present

## 2018-05-23 DIAGNOSIS — J449 Chronic obstructive pulmonary disease, unspecified: Secondary | ICD-10-CM | POA: Diagnosis present

## 2018-05-23 DIAGNOSIS — E1140 Type 2 diabetes mellitus with diabetic neuropathy, unspecified: Secondary | ICD-10-CM | POA: Diagnosis present

## 2018-05-23 DIAGNOSIS — E782 Mixed hyperlipidemia: Secondary | ICD-10-CM | POA: Diagnosis present

## 2018-05-23 DIAGNOSIS — R188 Other ascites: Secondary | ICD-10-CM | POA: Diagnosis present

## 2018-05-23 DIAGNOSIS — E1165 Type 2 diabetes mellitus with hyperglycemia: Secondary | ICD-10-CM | POA: Diagnosis present

## 2018-05-23 DIAGNOSIS — Z841 Family history of disorders of kidney and ureter: Secondary | ICD-10-CM

## 2018-05-23 DIAGNOSIS — Z833 Family history of diabetes mellitus: Secondary | ICD-10-CM

## 2018-05-23 DIAGNOSIS — Z9049 Acquired absence of other specified parts of digestive tract: Secondary | ICD-10-CM

## 2018-05-23 DIAGNOSIS — N390 Urinary tract infection, site not specified: Secondary | ICD-10-CM | POA: Diagnosis present

## 2018-05-23 DIAGNOSIS — K7460 Unspecified cirrhosis of liver: Secondary | ICD-10-CM | POA: Diagnosis present

## 2018-05-23 DIAGNOSIS — E7800 Pure hypercholesterolemia, unspecified: Secondary | ICD-10-CM | POA: Diagnosis present

## 2018-05-23 DIAGNOSIS — E785 Hyperlipidemia, unspecified: Secondary | ICD-10-CM | POA: Diagnosis present

## 2018-05-23 DIAGNOSIS — Z880 Allergy status to penicillin: Secondary | ICD-10-CM

## 2018-05-23 DIAGNOSIS — G9341 Metabolic encephalopathy: Secondary | ICD-10-CM | POA: Diagnosis not present

## 2018-05-23 DIAGNOSIS — T502X5A Adverse effect of carbonic-anhydrase inhibitors, benzothiadiazides and other diuretics, initial encounter: Secondary | ICD-10-CM | POA: Diagnosis not present

## 2018-05-23 DIAGNOSIS — Z821 Family history of blindness and visual loss: Secondary | ICD-10-CM

## 2018-05-23 DIAGNOSIS — E611 Iron deficiency: Secondary | ICD-10-CM

## 2018-05-23 LAB — URINALYSIS, ROUTINE W REFLEX MICROSCOPIC
BILIRUBIN URINE: NEGATIVE
Glucose, UA: NEGATIVE mg/dL
HGB URINE DIPSTICK: NEGATIVE
KETONES UR: NEGATIVE mg/dL
Leukocytes, UA: NEGATIVE
NITRITE: NEGATIVE
PROTEIN: NEGATIVE mg/dL
Specific Gravity, Urine: 1.012 (ref 1.005–1.030)
pH: 5 (ref 5.0–8.0)

## 2018-05-23 LAB — CBC WITH DIFFERENTIAL/PLATELET
Basophils Absolute: 0 10*3/uL (ref 0.0–0.1)
Basophils Relative: 0 %
EOS ABS: 0.1 10*3/uL (ref 0.0–0.7)
Eosinophils Relative: 1 %
HEMATOCRIT: 31.3 % — AB (ref 36.0–46.0)
HEMOGLOBIN: 10.2 g/dL — AB (ref 12.0–15.0)
LYMPHS ABS: 1 10*3/uL (ref 0.7–4.0)
Lymphocytes Relative: 17 %
MCH: 26.8 pg (ref 26.0–34.0)
MCHC: 32.6 g/dL (ref 30.0–36.0)
MCV: 82.2 fL (ref 78.0–100.0)
Monocytes Absolute: 0.5 10*3/uL (ref 0.1–1.0)
Monocytes Relative: 9 %
NEUTROS ABS: 4.1 10*3/uL (ref 1.7–7.7)
NEUTROS PCT: 73 %
Platelets: 87 10*3/uL — ABNORMAL LOW (ref 150–400)
RBC: 3.81 MIL/uL — ABNORMAL LOW (ref 3.87–5.11)
RDW: 14.2 % (ref 11.5–15.5)
WBC: 5.7 10*3/uL (ref 4.0–10.5)

## 2018-05-23 LAB — COMPREHENSIVE METABOLIC PANEL
ALT: 26 U/L (ref 0–44)
ANION GAP: 8 (ref 5–15)
AST: 40 U/L (ref 15–41)
Albumin: 4 g/dL (ref 3.5–5.0)
Alkaline Phosphatase: 103 U/L (ref 38–126)
BUN: 30 mg/dL — ABNORMAL HIGH (ref 8–23)
CHLORIDE: 86 mmol/L — AB (ref 98–111)
CO2: 27 mmol/L (ref 22–32)
CREATININE: 1.3 mg/dL — AB (ref 0.44–1.00)
Calcium: 9.5 mg/dL (ref 8.9–10.3)
GFR, EST AFRICAN AMERICAN: 48 mL/min — AB (ref >60)
GFR, EST NON AFRICAN AMERICAN: 42 mL/min — AB (ref >60)
Glucose, Bld: 217 mg/dL — ABNORMAL HIGH (ref 70–99)
Potassium: 4.8 mmol/L (ref 3.5–5.1)
SODIUM: 121 mmol/L — AB (ref 135–145)
Total Bilirubin: 0.8 mg/dL (ref 0.3–1.2)
Total Protein: 7.6 g/dL (ref 6.5–8.1)

## 2018-05-23 LAB — BASIC METABOLIC PANEL
Anion gap: 7 (ref 5–15)
BUN: 28 mg/dL — AB (ref 8–23)
CO2: 27 mmol/L (ref 22–32)
CREATININE: 1.35 mg/dL — AB (ref 0.44–1.00)
Calcium: 8.9 mg/dL (ref 8.9–10.3)
Chloride: 90 mmol/L — ABNORMAL LOW (ref 98–111)
GFR calc Af Amer: 46 mL/min — ABNORMAL LOW (ref >60)
GFR calc non Af Amer: 40 mL/min — ABNORMAL LOW (ref >60)
GLUCOSE: 243 mg/dL — AB (ref 70–99)
Potassium: 4.9 mmol/L (ref 3.5–5.1)
Sodium: 124 mmol/L — ABNORMAL LOW (ref 135–145)

## 2018-05-23 LAB — LACTIC ACID, PLASMA: LACTIC ACID, VENOUS: 1.9 mmol/L (ref 0.5–1.9)

## 2018-05-23 LAB — SODIUM, URINE, RANDOM: SODIUM UR: 31 mmol/L

## 2018-05-23 LAB — CBG MONITORING, ED: GLUCOSE-CAPILLARY: 232 mg/dL — AB (ref 70–99)

## 2018-05-23 LAB — AMMONIA: AMMONIA: 38 umol/L — AB (ref 9–35)

## 2018-05-23 LAB — OSMOLALITY: Osmolality: 276 mOsm/kg (ref 275–295)

## 2018-05-23 LAB — TSH: TSH: 2.125 u[IU]/mL (ref 0.350–4.500)

## 2018-05-23 LAB — GLUCOSE, CAPILLARY
Glucose-Capillary: 158 mg/dL — ABNORMAL HIGH (ref 70–99)
Glucose-Capillary: 233 mg/dL — ABNORMAL HIGH (ref 70–99)

## 2018-05-23 MED ORDER — PNEUMOCOCCAL VAC POLYVALENT 25 MCG/0.5ML IJ INJ
0.5000 mL | INJECTION | INTRAMUSCULAR | Status: AC
  Administered 2018-05-25: 0.5 mL via INTRAMUSCULAR
  Filled 2018-05-23: qty 0.5

## 2018-05-23 MED ORDER — ONDANSETRON HCL 4 MG PO TABS: 4.0000 mg | ORAL_TABLET | Freq: Four times a day (QID) | ORAL | Status: DC | PRN

## 2018-05-23 MED ORDER — SODIUM CHLORIDE 0.9 % IV SOLN: INTRAVENOUS | Status: DC

## 2018-05-23 MED ORDER — INSULIN ASPART 100 UNIT/ML ~~LOC~~ SOLN
0.0000 [IU] | Freq: Three times a day (TID) | SUBCUTANEOUS | Status: DC
  Administered 2018-05-24: 9 [IU] via SUBCUTANEOUS

## 2018-05-23 MED ORDER — ASPIRIN 81 MG PO CHEW
81.0000 mg | CHEWABLE_TABLET | ORAL | Status: DC
  Administered 2018-05-24 – 2018-05-25 (×2): 81 mg via ORAL
  Filled 2018-05-23 (×2): qty 1

## 2018-05-23 MED ORDER — ONDANSETRON HCL 4 MG/2ML IJ SOLN: 4.0000 mg | Freq: Four times a day (QID) | INTRAMUSCULAR | Status: DC | PRN

## 2018-05-23 MED ORDER — LEVOTHYROXINE SODIUM 100 MCG PO TABS
100.0000 ug | ORAL_TABLET | Freq: Every day | ORAL | Status: DC
  Administered 2018-05-24 – 2018-05-25 (×2): 100 ug via ORAL
  Filled 2018-05-23 (×2): qty 1

## 2018-05-23 MED ORDER — BENAZEPRIL HCL 10 MG PO TABS
20.0000 mg | ORAL_TABLET | Freq: Every morning | ORAL | Status: DC
  Administered 2018-05-24: 20 mg via ORAL
  Filled 2018-05-23: qty 2

## 2018-05-23 MED ORDER — ENOXAPARIN SODIUM 40 MG/0.4ML ~~LOC~~ SOLN: 40.0000 mg | SUBCUTANEOUS | Status: DC

## 2018-05-23 MED ORDER — DONEPEZIL HCL 5 MG PO TABS
5.0000 mg | ORAL_TABLET | Freq: Every day | ORAL | Status: DC
  Administered 2018-05-23 – 2018-05-24 (×2): 5 mg via ORAL
  Filled 2018-05-23 (×2): qty 1

## 2018-05-23 MED ORDER — SODIUM CHLORIDE 0.9 % IV BOLUS
500.0000 mL | Freq: Once | INTRAVENOUS | Status: AC
  Administered 2018-05-23: 500 mL via INTRAVENOUS

## 2018-05-23 MED ORDER — AMITRIPTYLINE HCL 25 MG PO TABS
25.0000 mg | ORAL_TABLET | Freq: Every day | ORAL | Status: DC
  Administered 2018-05-23 – 2018-05-24 (×2): 25 mg via ORAL
  Filled 2018-05-23 (×2): qty 1

## 2018-05-23 MED ORDER — SODIUM CHLORIDE 0.9 % IV SOLN
INTRAVENOUS | Status: DC
  Administered 2018-05-23 (×2): via INTRAVENOUS

## 2018-05-23 MED ORDER — INSULIN GLARGINE 100 UNIT/ML ~~LOC~~ SOLN
20.0000 [IU] | Freq: Every day | SUBCUTANEOUS | Status: DC
  Administered 2018-05-23: 20 [IU] via SUBCUTANEOUS
  Filled 2018-05-23 (×2): qty 0.2

## 2018-05-23 MED ORDER — PANTOPRAZOLE SODIUM 40 MG PO TBEC
40.0000 mg | DELAYED_RELEASE_TABLET | Freq: Every day | ORAL | Status: DC
  Administered 2018-05-24 – 2018-05-25 (×2): 40 mg via ORAL
  Filled 2018-05-23 (×2): qty 1

## 2018-05-23 MED ORDER — POLYETHYLENE GLYCOL 3350 17 G PO PACK: 17.0000 g | PACK | Freq: Every day | ORAL | Status: DC | PRN

## 2018-05-23 MED ORDER — EZETIMIBE 10 MG PO TABS
10.0000 mg | ORAL_TABLET | Freq: Every morning | ORAL | Status: DC
  Administered 2018-05-24 – 2018-05-25 (×2): 10 mg via ORAL
  Filled 2018-05-23 (×2): qty 1

## 2018-05-23 MED ORDER — LIRAGLUTIDE 18 MG/3ML ~~LOC~~ SOPN: 1.8000 mg | PEN_INJECTOR | Freq: Every day | SUBCUTANEOUS | Status: DC

## 2018-05-23 MED ORDER — INFLUENZA VAC SPLIT HIGH-DOSE 0.5 ML IM SUSY
0.5000 mL | PREFILLED_SYRINGE | INTRAMUSCULAR | Status: AC
  Administered 2018-05-24: 0.5 mL via INTRAMUSCULAR
  Filled 2018-05-23: qty 0.5

## 2018-05-23 NOTE — H&P (Addendum)
History and Physical    Penny Martinez JJO:[id number] DOB: 02/11/1951 DOA: 05/23/2018  PCP: Vidal Schwalbe, MD   Patient coming from: Home  Chief Complaint: Increased sleepiness, burning with urination  HPI: Penny Martinez is a 67 y.o. female with medical history significant for DM2, Hepatic cirrhosis, HTN, obesity,  CHF, who rpesented to the ED with c/o burning with urination, lower abdominal paina nd back apin of ~1 month duration. PAtient wa sdiagnosed with a UTI ~1 month ago, sahe says he was treated with antibiotics- ?name and symptoms improved but quickly returned. She presented again to urgent care was diagnose dwith UTI, prescribed ciprofloxacin, which she took for a t least 3 days, maybe longer, and stopped when she started itching, which she attributed to the antibiotic. She has had presistent symptoms since then. Patient reports confusion since yesterday, improved today, slept all day and had headaches yesterday, with nausea but no vomiting. No seizures.  Patient reports reduced PO intake over the last fews days, and compliance with lasix 91m daily.   ED Course: Stable vitals. NA- 121, Cr about baseline- 1.3. Platelet chronically low- 87. Ammonia- 38, Lactic acid- 1.9. 2 view UA- clean. 2 view chest xray - clear. Head Ct- chronic ischemic angiopathy.  Hospitalist ws called to admit for hyponatremia.   Review of Systems: As per HPI all other systems reviewed and negative.  Past Medical History:  Diagnosis Date  . Acid reflux   . Anemia   . Arthritis   . C. difficile colitis June/July 2015  . [place] [place]    [place] cancer  . CHF (congestive heart failure) ([facility]   . 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 Negative viral markers 2015  . Complication of anesthesia   . COPD (chronic obstructive pulmonary disease) ([facility]   . Diabetes mellitus without complication ([facility]   . GERD (gastroesophageal reflux disease)   . History of kidney stones   . Hypercholesteremia   .  Hypertension   . Hypothyroidism   . IBS (irritable bowel syndrome)   . Kidney disease   . Liver disease   . Neuropathy   . PONV (postoperative nausea and vomiting)   . Sleep apnea   . Thyroid disease     Past Surgical History:  Procedure Laterality Date  . ABDOMINAL HYSTERECTOMY    . APPENDECTOMY    . CAROTID ARTERY - SUBCLAVIAN ARTERY BYPASS GRAFT Right   . CARPAL TUNNEL RELEASE Right   . CARPAL TUNNEL RELEASE Left 12/03/2016   Procedure: CARPAL TUNNEL RELEASE;  Surgeon: SCarole Civil MD;  Location: [facility];  Service: Orthopedics;  Laterality: Left;  . CATARACT EXTRACTION Bilateral   . CHOLECYSTECTOMY    . COLONOSCOPY N/A 02/19/2014   Dr. RGala Romney rectal varices, rectal polyp overlying a varix non-manipulated  . ESOPHAGOGASTRODUODENOSCOPY N/A 02/19/2014   Dr. RGala Romney 4 columns of grade 2 esophageal varices, multiple gastric polyps with largest biopsied and hyperplastic. Negative H.pylori  . ESOPHAGOGASTRODUODENOSCOPY N/A 04/13/2018   Procedure: ESOPHAGOGASTRODUODENOSCOPY (EGD);  Surgeon: RDaneil Dolin MD;  Location: [facility];  Service: Endoscopy;  Laterality: N/A;  1:00PM-rescheduled to 8/7  . GIVENS CAPSULE STUDY N/A 05/04/2014   multiple polypoid-like lesions throughout small bowel, scattered superficial non-bleeding erosions more distal bowel[facility] Capsule study not complete, poor images.   .Marland KitchenTRIGGER FINGER RELEASE Left 12/03/2016   Procedure: RELEASE TRIGGER FINGER/A-1 PULLEY LEFT LONG TRIGGER FINGER RELEASE;  Surgeon: SCarole Civil MD;  Location: [facility];  Service: Orthopedics;  Laterality: Left;  . urosepsis       reports that she quit smoking about 15 years ago. She has a 70.00 pack-year smoking history. She has quit using smokeless tobacco. She reports that she does not drink alcohol or use drugs.  Allergies  Allergen Reactions  . Erythromycin Hives  . Niacin Rash and Shortness Of Breath  . Penicillins Shortness Of Breath and  Other (See Comments)    Has patient had a PCN reaction causing immediate rash, facial/tongue/throat swelling, SOB or lightheadedness with hypotension: Yes Has patient had a PCN reaction causing severe rash involving mucus membranes or skin necrosis: No Has patient had a PCN reaction that required hospitalization: No Has patient had a PCN reaction occurring within the last 10 years: No If all of the above answers are "NO", then may proceed with Cephalosporin use.   . Sulfa Antibiotics Hives  . Ciprofloxacin Itching  . Statins Other (See Comments)    Muscle Pain  . Codeine Itching    Family History  Problem Relation Age of Onset  . Diabetes Mother   . Hypertension Mother   . Kidney failure Mother   . Blindness Mother   . Aneurysm Father   . Colon cancer Neg Hx     Prior to Admission medications   Medication Sig Start Date End Date Taking? Authorizing Provider  amitriptyline (ELAVIL) 25 MG tablet Take 25 mg by mouth at bedtime.    [provider]  aspirin 81 MG tablet Take 81 mg by mouth every morning.     [provider]  Azelastine HCl 137 MCG/SPRAY SOLN Place 1 spray into both nostrils 2 (two) times daily as needed (for congestion).  03/21/18   [provider]  baclofen (LIORESAL) 10 MG tablet Take 10 mg by mouth at bedtime.    [provider]  benazepril (LOTENSIN) 20 MG tablet Take 20 mg by mouth every morning.     [provider]  CRANBERRY PO Take 1 capsule by mouth at bedtime.    [provider]  diphenhydrAMINE (SOMINEX) 25 MG tablet Take 50 mg by mouth at bedtime.     [provider]  diphenoxylate-atropine (LOMOTIL) 2.5-0.025 MG tablet 1-2 tablets every 4-6 hours as needed for diarrhea. Patient taking differently: Take 1-2 tablets by mouth every 4 (four) hours as needed for diarrhea or loose stools.  02/11/18   Mahala Menghini, PA-C  donepezil (ARICEPT) 5 MG tablet Take 5 mg by mouth at bedtime.  01/12/18   [provider]  ezetimibe (ZETIA) 10 MG tablet Take 10 mg by mouth every morning.     [provider]  furosemide (LASIX) 80 MG tablet Take 80 mg by mouth every morning.     [provider]  gabapentin (NEURONTIN) 100 MG capsule TAKE (1) CAPSULE BY MOUTH THREE TIMES DAILY 04/15/18   Nida, Marella Chimes, MD  HUMULIN R U-500 KWIKPEN 500 UNIT/ML kwikpen INJECT 130 UNITS S.Q. THREE TIMES DAILY WITH MEALS. Patient taking differently: 100 Units 3 (three) times daily with meals.  03/02/18   Cassandria Anger, MD  lansoprazole (PREVACID) 30 MG capsule Take 30 mg by mouth every morning.     [provider]  levothyroxine (SYNTHROID, LEVOTHROID) 100 MCG tablet TAKE 1 TABLET BY MOUTH ONCE DAILY. 01/20/18   Cassandria Anger, MD  Multiple Vitamins-Minerals (CENTRUM SILVER ADULT 50+ PO) Take 1 tablet by mouth daily.    [provider]  spironolactone (ALDACTONE) 25  MG tablet Take 25 mg by mouth daily. 05/19/17   [provider]  VICTOZA 18 MG/3ML SOPN INJECT 1.8MG S.Q. ONCE DAILY Patient taking differently: INJECT 1.8MG S.Q. ONCE DAILY AT BEDTIME 03/14/18   Cassandria Anger, MD  Vitamin D, Ergocalciferol, (DRISDOL) 50000 units CAPS capsule Take 50,000 Units by mouth every 30 (thirty) days.  01/20/18   [provider]    Physical Exam: Vitals:   05/23/18 1245 05/23/18 1445 05/23/18 1500  BP: (!) 110/40  (!) 150/79  Pulse: 88 87 90  Resp: 12 (!) 21 13  Temp: 98 F (36.7 C)    TempSrc: Oral    SpO2: 95% 96% 93%    Constitutional: NAD, calm, comfortable Vitals:   05/23/18 1245 05/23/18 1445 05/23/18 1500  BP: (!) 110/40  (!) 150/79  Pulse: 88 87 90  Resp: 12 (!) 21 13  Temp: 98 F (36.7 C)    TempSrc: Oral    SpO2: 95% 96% 93%   Eyes: PERRL, lids and conjunctivae normal ENMT: Mucous membranes are moist. Posterior pharynx clear of any exudate or lesions..  Neck: normal, supple, no masses, no thyromegaly Respiratory: clear to  auscultation bilaterally, no wheezing, no crackles. Normal respiratory effort. No accessory muscle use.  Cardiovascular: Regular rate and rhythm, 2/6 systolic murmurs l;oudest aortic area, no  rubs / gallops. TRAce bilateral pitting edema. 2+ pedal pulses. No carotid bruits.  Abdomen: no tenderness, no masses palpated. No hepatosplenomegaly. Bowel sounds positive.  Musculoskeletal: no clubbing / cyanosis. No joint deformity upper and lower extremities. Good ROM, no contractures. Normal muscle tone.  Skin: no rashes, lesions, ulcers. No induration Neurologic: CN 2-12 grossly intact. Sensation intact, Strength 5/5 in all 4.  Psychiatric: Normal judgment and insight. Alert and oriented x 3.  Normal mood. A few times needed a reminder on what questions I asked, ? Mild confusion, she says this happens only when she is ill.  Labs on Admission: I have personally reviewed following labs and imaging studies  CBC: Recent Labs  Lab 05/17/18 1202 05/23/18 1445  WBC 5.0 5.7  NEUTROABS 3.5 4.1  HGB 10.1* 10.2*  HCT 32.9* 31.3*  MCV 86.8 82.2  PLT 93* 87*   Basic Metabolic Panel: Recent Labs  Lab 05/17/18 1202 05/23/18 1445  NA 131* 121*  K 4.8 4.8  CL 93* 86*  CO2 29 27  GLUCOSE 290* 217*  BUN 26* 30*  CREATININE 1.36* 1.30*  CALCIUM 9.0 9.5   Liver Function Tests: Recent Labs  Lab 05/17/18 1202 05/23/18 1445  AST 30 40  ALT 22 26  ALKPHOS 126 103  BILITOT 0.6 0.8  PROT 7.0 7.6  ALBUMIN 3.6 4.0    Recent Labs  Lab 05/23/18 1445  AMMONIA 38*   CBG: Recent Labs  Lab 05/23/18 1420  GLUCAP 232*   Urine analysis:    Component Value Date/Time   COLORURINE YELLOW 05/23/2018 1310   APPEARANCEUR HAZY (A) 05/23/2018 1310   LABSPEC 1.012 05/23/2018 1310   PHURINE 5.0 05/23/2018 1310   GLUCOSEU NEGATIVE 05/23/2018 1310   HGBUR NEGATIVE 05/23/2018 1310   BILIRUBINUR NEGATIVE 05/23/2018 1310   KETONESUR NEGATIVE 05/23/2018 1310   PROTEINUR NEGATIVE 05/23/2018 1310    UROBILINOGEN 0.2 03/05/2014 2140   NITRITE NEGATIVE 05/23/2018 1310   LEUKOCYTESUR NEGATIVE 05/23/2018 1310    Radiological Exams on Admission: Dg Chest 2 View  Result Date: 05/23/2018 CLINICAL DATA:  Chills, cough, altered mental status. Possible UTI. History of COPD, CHF, diabetes, former smoker. EXAM:  CHEST - 2 VIEW COMPARISON:  PA and lateral chest x-ray of September 01, 2018 FINDINGS: The lungs are adequately inflated. There is no focal infiltrate. There is no pleural effusion. The interstitial markings are coarse though stable. There is calcification in the wall of the thoracic aorta. The heart and pulmonary vascularity are normal. The bony thorax exhibits no acute abnormality. IMPRESSION: Chronic bronchitic changes. No alveolar pneumonia, CHF, nor other acute cardiopulmonary abnormality. Thoracic aortic atherosclerosis. Electronically Signed   By: David  Martinique M.D.   On: 05/23/2018 15:14   Ct Head Wo Contrast  Result Date: 05/23/2018 CLINICAL DATA:  Memory loss and altered mental status. EXAM: CT HEAD WITHOUT CONTRAST TECHNIQUE: Contiguous axial images were obtained from the base of the skull through the vertex without intravenous contrast. COMPARISON:  None. FINDINGS: Brain: There is no mass, hemorrhage or extra-axial collection. The size and configuration of the ventricles and extra-axial CSF spaces are normal. There is no acute or chronic infarction. There is hypoattenuation of the periventricular white matter, most commonly indicating chronic ischemic microangiopathy. Vascular: No abnormal hyperdensity of the major intracranial arteries or dural venous sinuses. No intracranial atherosclerosis. Skull: The visualized skull base, calvarium and extracranial soft tissues are normal. Sinuses/Orbits: No fluid levels or advanced mucosal thickening of the visualized paranasal sinuses. No mastoid or middle ear effusion. The orbits are normal. IMPRESSION: Chronic ischemic microangiopathy without acute  intracranial abnormality. Electronically Signed   By: Ulyses Jarred M.D.   On: 05/23/2018 15:29    EKG: Independently reviewed. Old RBBB.   Assessment/Plan Principal Problem:   Hyponatremia Active Problems:   Diabetes mellitus with stage 3 chronic kidney disease (HCC)   CHF (congestive heart failure) (HCC)   Hepatic cirrhosis (HCC)   Essential hypertension, benign   Other specified hypothyroidism   Thrombocytopenia ([employer])   Uncontrolled type 2 diabetes mellitus with complication (HCC)  Hyponatremia- 121, Baseline 131- 133. Appears euvolemic, Appears to be mildly to moderately symptomatic- lethargy, Nausea, headache X 1 day. Likely 2/2 Diuretic Lasix 4m daily, Ciprofloxacin also known to cause hyponatremia.  - NS 5025mbolus, cont at 100cc/hr X 15 hrs  - Serum Osm  - Urine Na, urine OSm - TSH - Close BMP monitoring  Metabolic Encephalopathy- Likely 2/2 hyponatremia. 2 view Chest xray, UA- unremarkable. Ammonia- 38. Lactic acid- 1.9. HEad CT- No acute change. Per care everywhere she saw nerology for episodic confusion- thought to be transient toxic-metabolic encephalopathy, with concern for moderate cognitive impairment, mild-moderate dementia.  - Hydrate with saline - PT eval  Dysuria- 2 recent UTI, persistent symptoms. Afebrile. WBC- 5.7. UA here- clean. May need to follow up with urology for further eval, r/o interstitial cystitis, other pathology.  - F/u urine cultures - Will hold off on antibiotics considering UA findings.    DM2 - 04/18/18- Glucose- 217. Hgba1c- 7.1.  - SSI - Cont long acting insulin with LAntus 20 daily - Cont victoza  HTN- Stable. - Hold lasix, Spironolactone, Cont benazepril  Liver Cirrhosis, CHF- Appears euvolemic. But with low sodium. No ECHO on file, even on Care everywhere. - Monitor while hydrating - Hold Lasix and spironolactone for now  Thrombocytopenia- 87, about baseline. Likely 2/2 Liver cirrhosis.  - SCDs, avoiding therapeutic anticoag  at this time   DVT prophylaxis: Scds Code Status: Full Family Communication: none at bedside Disposition Plan: per rounding team Consults called: None  Admission status: Obs, tele   EjBethena RoysD [facility] Pager [phone] 6PM-2AM.  Otherwise please  contact night-coverage [email or web address] Password [email or web address]  05/23/2018, 5:09 PM

## 2018-05-23 NOTE — ED Provider Notes (Signed)
[facility] EMERGENCY DEPARTMENT Provider Note   CSN: [id number] Arrival date & time: 05/23/18  1226     History   Chief Complaint Chief Complaint  Patient presents with  . Urinary Tract Infection    HPI Penny Martinez is a 67 y.o. female with a past medical history most pertinent to for diabetes, cirrhosis secondary to [place], COPD, CHF, stage III kidney disease and frequent UTIs presenting with complaint of dysuria, generalized weakness and drowsiness.  Her family at bedside also endorses increased confusion and forgetfulness, most noticeable over the past 24 hours.  She was treated for UTI 1 month ago, felt improved for several weeks, took a partial course of Cipro, which made her itchy so she stopped midcourse.  She continues to have low back pain along with frequent urination of small amounts of urine, mild dysuria, denies hematuria.  She has had no fevers or chills, no nausea or emesis and denies abdominal pain, except for suprapubic fullness sensation.  Family at bedside endorses she spent the entire day yesterday sleeping which is unusual for her.  She has had no treatments for her acute symptoms prior to arrival.  Her last meal was yesterday evening, she endorses decreased appetite today.  The history is provided by the patient, a relative and the spouse.    Past Medical History:  Diagnosis Date  . Acid reflux   . Anemia   . Arthritis   . C. difficile colitis June/July 2015  . Cancer ([place])    skin cancer  . CHF (congestive heart failure) ([place])   . 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 Negative viral markers 2015  . Complication of anesthesia   . COPD (chronic obstructive pulmonary disease) ([place])   . Diabetes mellitus without complication ([place])   . GERD (gastroesophageal reflux disease)   . History of kidney stones   . Hypercholesteremia   . Hypertension   . Hypothyroidism   . IBS (irritable bowel syndrome)   . Kidney disease   . Liver disease   . Neuropathy   .  PONV (postoperative nausea and vomiting)   . Sleep apnea   . Thyroid disease     Patient Active Problem List   Diagnosis Date Noted  . Other dysphagia 09/15/2017  . Uncontrolled type 2 diabetes mellitus with complication ([place]) 15/40/0867  . Mixed hyperlipidemia 04/27/2017  . Hyponatremia 04/27/2017  . Chronic diarrhea 12/15/2016  . Carpal tunnel syndrome, left   . Trigger middle finger of left hand   . Thrombocytopenia ([place]) 11/04/2016  . Iron deficiency anemia due to chronic blood loss 01/31/2016  . Other specified hypothyroidism 09/20/2015  . Essential hypertension, benign 07/12/2015  . Morbid obesity with BMI of 40.0-44.9, adult ([place]) 07/12/2015  . Hepatic cirrhosis ([place]) 04/02/2014  . Acute colitis 03/05/2014  . Abdominal pain, acute, left lower quadrant 03/05/2014  . Colitis 03/05/2014  . Splenomegaly, congestive, chronic 02/17/2014  . Sepsis ([place]) 02/15/2014  . Absolute anemia 02/15/2014  . Diabetes mellitus with stage 3 chronic kidney disease ([place]) 02/15/2014  . CHF (congestive heart failure) ([place]) 02/15/2014  . CKD (chronic kidney disease) 02/15/2014  . UTI (lower urinary tract infection) 02/15/2014    Past Surgical History:  Procedure Laterality Date  . ABDOMINAL HYSTERECTOMY    . APPENDECTOMY    . CAROTID ARTERY - SUBCLAVIAN ARTERY BYPASS GRAFT Right   . CARPAL TUNNEL RELEASE Right   . CARPAL TUNNEL RELEASE Left 12/03/2016   Procedure: CARPAL TUNNEL RELEASE;  Surgeon:  Carole Civil, MD;  Location: [facility] ORS;  Service: Orthopedics;  Laterality: Left;  . CATARACT EXTRACTION Bilateral   . CHOLECYSTECTOMY    . COLONOSCOPY N/A 02/19/2014   Dr. Gala Romney: rectal varices, rectal polyp overlying a varix non-manipulated  . ESOPHAGOGASTRODUODENOSCOPY N/A 02/19/2014   Dr. Gala Romney: 4 columns of grade 2 esophageal varices, multiple gastric polyps with largest biopsied and hyperplastic. Negative H.pylori  . ESOPHAGOGASTRODUODENOSCOPY N/A 04/13/2018   Procedure:  ESOPHAGOGASTRODUODENOSCOPY (EGD);  Surgeon: Daneil Dolin, MD;  Location: [facility] ENDO SUITE;  Service: Endoscopy;  Laterality: N/A;  1:00PM-rescheduled to 8/7  . GIVENS CAPSULE STUDY N/A 05/04/2014   multiple polypoid-like lesions throughout small bowel, scattered superficial non-bleeding erosions more distal bowel, referred to [facility] for enteroscopy. Capsule study not complete, poor images.   Marland Kitchen TRIGGER FINGER RELEASE Left 12/03/2016   Procedure: RELEASE TRIGGER FINGER/A-1 PULLEY LEFT LONG TRIGGER FINGER RELEASE;  Surgeon: Carole Civil, MD;  Location: [facility] ORS;  Service: Orthopedics;  Laterality: Left;  . urosepsis       OB History    Gravida      Para      Term      Preterm      AB      Living  3     SAB      TAB      Ectopic      Multiple      Live Births               Home Medications    Prior to Admission medications   Medication Sig Start Date End Date Taking? Authorizing Provider  amitriptyline (ELAVIL) 25 MG tablet Take 25 mg by mouth at bedtime.    [provider]  aspirin 81 MG tablet Take 81 mg by mouth every morning.     [provider]  Azelastine HCl 137 MCG/SPRAY SOLN Place 1 spray into both nostrils 2 (two) times daily as needed (for congestion).  03/21/18   [provider]  baclofen (LIORESAL) 10 MG tablet Take 10 mg by mouth at bedtime.    [provider]  benazepril (LOTENSIN) 20 MG tablet Take 20 mg by mouth every morning.     [provider]  CRANBERRY PO Take 1 capsule by mouth at bedtime.    [provider]  diphenhydrAMINE (SOMINEX) 25 MG tablet Take 50 mg by mouth at bedtime.     [provider]  diphenoxylate-atropine (LOMOTIL) 2.5-0.025 MG tablet 1-2 tablets every 4-6 hours as needed for diarrhea. Patient taking differently: Take 1-2 tablets by mouth every 4 (four) hours as needed for diarrhea or loose stools.  02/11/18   Mahala Menghini, PA-C  donepezil (ARICEPT) 5 MG tablet Take  5 mg by mouth at bedtime.  01/12/18   [provider]  ezetimibe (ZETIA) 10 MG tablet Take 10 mg by mouth every morning.     [provider]  furosemide (LASIX) 80 MG tablet Take 80 mg by mouth every morning.     [provider]  gabapentin (NEURONTIN) 100 MG capsule TAKE (1) CAPSULE BY MOUTH THREE TIMES DAILY 04/15/18   Nida, Marella Chimes, MD  HUMULIN R U-500 KWIKPEN 500 UNIT/ML kwikpen INJECT 130 UNITS S.Q. THREE TIMES DAILY WITH MEALS. Patient taking differently: 100 Units 3 (three) times daily with meals.  03/02/18   Cassandria Anger, MD  lansoprazole (PREVACID) 30 MG capsule Take 30 mg by mouth every morning.     [provider]  levothyroxine (SYNTHROID, LEVOTHROID) 100 MCG tablet TAKE 1 TABLET BY MOUTH ONCE DAILY. 01/20/18   Cassandria Anger, MD  Multiple Vitamins-Minerals (CENTRUM SILVER ADULT 50+ PO) Take 1 tablet by mouth daily.    [provider]  spironolactone (ALDACTONE) 25 MG tablet Take 25 mg by mouth daily. 05/19/17   [provider]  VICTOZA 18 MG/3ML SOPN INJECT 1.8MG S.Q. ONCE DAILY Patient taking differently: INJECT 1.8MG S.Q. ONCE DAILY AT BEDTIME 03/14/18   Cassandria Anger, MD  Vitamin D, Ergocalciferol, (DRISDOL) 50000 units CAPS capsule Take 50,000 Units by mouth every 30 (thirty) days.  01/20/18   [provider]    Family History Family History  Problem Relation Age of Onset  . Diabetes Mother   . Hypertension Mother   . Kidney failure Mother   . Blindness Mother   . Aneurysm Father   . Colon cancer Neg Hx     Social History Social History   Tobacco Use  . Smoking status: Former Smoker    Packs/day: 2.00    Years: 35.00    Pack years: 70.00    Last attempt to quit: 05/05/2003    Years since quitting: 15.0  . Smokeless tobacco: Former Systems developer  . Tobacco comment: Quit smoking 11 years ago  Substance Use Topics  . Alcohol use: No  . Drug use: No     Allergies   Erythromycin;  Niacin; Penicillins; Sulfa antibiotics; Ciprofloxacin; Statins; and Codeine   Review of Systems Review of Systems  Constitutional: Positive for chills and fatigue. Negative for fever.  HENT: Negative for congestion and sore throat.   Eyes: Negative.   Respiratory: Negative for chest tightness and shortness of breath.   Cardiovascular: Negative for chest pain.  Gastrointestinal: Negative for abdominal pain, blood in stool, diarrhea, nausea and vomiting.  Genitourinary: Positive for dysuria.  Musculoskeletal: Positive for back pain. Negative for arthralgias, joint swelling and neck pain.  Skin: Negative.  Negative for color change, rash and wound.  Neurological: Negative for dizziness, syncope, weakness, light-headedness, numbness and headaches.  Psychiatric/Behavioral: Positive for confusion.     Physical Exam Updated Vital Signs BP (!) 150/79   Pulse 90   Temp 98 F (36.7 C) (Oral)   Resp 13   SpO2 93%   Physical Exam  Constitutional: She appears well-developed and well-nourished.  Looks fatigued.  HENT:  Head: Normocephalic and atraumatic.  Eyes: Conjunctivae are normal.  Neck: Normal range of motion.  Cardiovascular: Normal rate, regular rhythm, normal heart sounds and intact distal pulses.  Pulmonary/Chest: Effort normal and breath sounds normal. She has no wheezes.  Abdominal: Soft. Bowel sounds are normal. She exhibits no mass. There is no tenderness. There is no rebound and no guarding.  No asterixis.  Musculoskeletal: Normal range of motion. She exhibits tenderness.       Lumbar back: She exhibits tenderness.       Back:  Neurological: She is alert. She displays normal reflexes. No cranial nerve deficit. She exhibits normal muscle tone.  Skin: Skin is warm and dry.  No jaundice  Psychiatric: She has a normal mood and affect.  Nursing note and vitals reviewed.    ED Treatments / Results  Labs (all labs ordered are listed, but only abnormal results are  displayed) Labs Reviewed  URINALYSIS, ROUTINE W REFLEX MICROSCOPIC - Abnormal; Notable for the following components:      Result Value   APPearance HAZY (*)    All other components within normal limits  COMPREHENSIVE  METABOLIC PANEL - Abnormal; Notable for the following components:   Sodium 121 (*)    Chloride 86 (*)    Glucose, Bld 217 (*)    BUN 30 (*)    Creatinine, Ser 1.30 (*)    GFR calc non Af Amer 42 (*)    GFR calc Af Amer 48 (*)    All other components within normal limits  CBC WITH DIFFERENTIAL/PLATELET - Abnormal; Notable for the following components:   RBC 3.81 (*)    Hemoglobin 10.2 (*)    HCT 31.3 (*)    Platelets 87 (*)    All other components within normal limits  AMMONIA - Abnormal; Notable for the following components:   Ammonia 38 (*)    All other components within normal limits  CBG MONITORING, ED - Abnormal; Notable for the following components:   Glucose-Capillary 232 (*)    All other components within normal limits  URINE CULTURE  LACTIC ACID, PLASMA  OSMOLALITY  SODIUM, URINE, RANDOM  OSMOLALITY, URINE  TSH    EKG ED ECG REPORT   Date: 05/23/2018  Rate: 88  Rhythm: normal sinus rhythm  QRS Axis: normal  Intervals: normal  ST/T Wave abnormalities: normal  Conduction Disutrbances: RBBB   Narrative Interpretation: q waves inferior leads unchanged from prior.  Old EKG Reviewed: unchanged  I have personally reviewed the EKG tracing and agree with the computerized printout as noted.   Radiology Dg Chest 2 View  Result Date: 05/23/2018 CLINICAL DATA:  Chills, cough, altered mental status. Possible UTI. History of COPD, CHF, diabetes, former smoker. EXAM: CHEST - 2 VIEW COMPARISON:  PA and lateral chest x-ray of September 01, 2018 FINDINGS: The lungs are adequately inflated. There is no focal infiltrate. There is no pleural effusion. The interstitial markings are coarse though stable. There is calcification in the wall of the thoracic aorta. The  heart and pulmonary vascularity are normal. The bony thorax exhibits no acute abnormality. IMPRESSION: Chronic bronchitic changes. No alveolar pneumonia, CHF, nor other acute cardiopulmonary abnormality. Thoracic aortic atherosclerosis. Electronically Signed   By: David  Martinique M.D.   On: 05/23/2018 15:14   Ct Head Wo Contrast  Result Date: 05/23/2018 CLINICAL DATA:  Memory loss and altered mental status. EXAM: CT HEAD WITHOUT CONTRAST TECHNIQUE: Contiguous axial images were obtained from the base of the skull through the vertex without intravenous contrast. COMPARISON:  None. FINDINGS: Brain: There is no mass, hemorrhage or extra-axial collection. The size and configuration of the ventricles and extra-axial CSF spaces are normal. There is no acute or chronic infarction. There is hypoattenuation of the periventricular white matter, most commonly indicating chronic ischemic microangiopathy. Vascular: No abnormal hyperdensity of the major intracranial arteries or dural venous sinuses. No intracranial atherosclerosis. Skull: The visualized skull base, calvarium and extracranial soft tissues are normal. Sinuses/Orbits: No fluid levels or advanced mucosal thickening of the visualized paranasal sinuses. No mastoid or middle ear effusion. The orbits are normal. IMPRESSION: Chronic ischemic microangiopathy without acute intracranial abnormality. Electronically Signed   By: Ulyses Jarred M.D.   On: 05/23/2018 15:29    Procedures Procedures (including critical care time)  Medications Ordered in ED Medications  sodium chloride 0.9 % bolus 500 mL (has no administration in time range)  0.9 %  sodium chloride infusion (has no administration in time range)     Initial Impression / Assessment and Plan / ED Course  I have reviewed the triage vital signs and the nursing notes.  Pertinent labs & imaging  results that were available during my care of the patient were reviewed by me and considered in my medical  decision making (see chart for details).     Pt with dysuria but no uti per labs.  Weakness, drowsiness, mild increase in confusion per family with hyponatremia, hyperglycemia without ketosis. Normal lactate, pt is not septic, normal Ct imaging of brain negative for acute findings.  Ammonia slightly elevated but doubt current sx represents hepatic induced encephalopathy. Pt will need admission for replacement of Na+.    Spoke with Dr. Denton Brick who will see pt in ed.  Final Clinical Impressions(s) / ED Diagnoses   Final diagnoses:  Hyponatremia  Weakness  Hyperglycemia    ED Discharge Orders    None       Landis Martins 05/23/18 1702    Milton Ferguson, MD 05/24/18 [phone]

## 2018-05-23 NOTE — ED Triage Notes (Signed)
Patient was treated for a UTI last week. Patient was given Cipro but states she had to stop taking it due to itching. Patient states she is still having urinary frequency and burning sensation.

## 2018-05-24 ENCOUNTER — Ambulatory Visit (HOSPITAL_COMMUNITY): Payer: Medicare Other | Admitting: Internal Medicine

## 2018-05-24 DIAGNOSIS — M1990 Unspecified osteoarthritis, unspecified site: Secondary | ICD-10-CM | POA: Diagnosis present

## 2018-05-24 DIAGNOSIS — N390 Urinary tract infection, site not specified: Secondary | ICD-10-CM | POA: Diagnosis present

## 2018-05-24 DIAGNOSIS — E038 Other specified hypothyroidism: Secondary | ICD-10-CM

## 2018-05-24 DIAGNOSIS — N183 Chronic kidney disease, stage 3 unspecified: Secondary | ICD-10-CM

## 2018-05-24 DIAGNOSIS — Z9842 Cataract extraction status, left eye: Secondary | ICD-10-CM | POA: Diagnosis not present

## 2018-05-24 DIAGNOSIS — K7460 Unspecified cirrhosis of liver: Secondary | ICD-10-CM | POA: Diagnosis not present

## 2018-05-24 DIAGNOSIS — I130 Hypertensive heart and chronic kidney disease with heart failure and stage 1 through stage 4 chronic kidney disease, or unspecified chronic kidney disease: Secondary | ICD-10-CM | POA: Diagnosis present

## 2018-05-24 DIAGNOSIS — Z87440 Personal history of urinary (tract) infections: Secondary | ICD-10-CM | POA: Diagnosis not present

## 2018-05-24 DIAGNOSIS — J449 Chronic obstructive pulmonary disease, unspecified: Secondary | ICD-10-CM | POA: Diagnosis present

## 2018-05-24 DIAGNOSIS — R300 Dysuria: Secondary | ICD-10-CM | POA: Diagnosis present

## 2018-05-24 DIAGNOSIS — Z833 Family history of diabetes mellitus: Secondary | ICD-10-CM | POA: Diagnosis not present

## 2018-05-24 DIAGNOSIS — E782 Mixed hyperlipidemia: Secondary | ICD-10-CM | POA: Diagnosis present

## 2018-05-24 DIAGNOSIS — Z9841 Cataract extraction status, right eye: Secondary | ICD-10-CM | POA: Diagnosis not present

## 2018-05-24 DIAGNOSIS — E871 Hypo-osmolality and hyponatremia: Secondary | ICD-10-CM | POA: Diagnosis present

## 2018-05-24 DIAGNOSIS — E1140 Type 2 diabetes mellitus with diabetic neuropathy, unspecified: Secondary | ICD-10-CM | POA: Diagnosis present

## 2018-05-24 DIAGNOSIS — Z87442 Personal history of urinary calculi: Secondary | ICD-10-CM | POA: Diagnosis not present

## 2018-05-24 DIAGNOSIS — I509 Heart failure, unspecified: Secondary | ICD-10-CM | POA: Diagnosis present

## 2018-05-24 DIAGNOSIS — E1122 Type 2 diabetes mellitus with diabetic chronic kidney disease: Secondary | ICD-10-CM

## 2018-05-24 DIAGNOSIS — K7581 Nonalcoholic steatohepatitis (NASH): Secondary | ICD-10-CM | POA: Diagnosis present

## 2018-05-24 DIAGNOSIS — K589 Irritable bowel syndrome without diarrhea: Secondary | ICD-10-CM | POA: Diagnosis present

## 2018-05-24 DIAGNOSIS — E7800 Pure hypercholesterolemia, unspecified: Secondary | ICD-10-CM | POA: Diagnosis present

## 2018-05-24 DIAGNOSIS — G9341 Metabolic encephalopathy: Secondary | ICD-10-CM | POA: Diagnosis not present

## 2018-05-24 DIAGNOSIS — Z85828 Personal history of other malignant neoplasm of skin: Secondary | ICD-10-CM | POA: Diagnosis not present

## 2018-05-24 DIAGNOSIS — D696 Thrombocytopenia, unspecified: Secondary | ICD-10-CM | POA: Diagnosis not present

## 2018-05-24 DIAGNOSIS — Z23 Encounter for immunization: Secondary | ICD-10-CM | POA: Diagnosis present

## 2018-05-24 DIAGNOSIS — I10 Essential (primary) hypertension: Secondary | ICD-10-CM | POA: Diagnosis not present

## 2018-05-24 DIAGNOSIS — Z90710 Acquired absence of both cervix and uterus: Secondary | ICD-10-CM | POA: Diagnosis not present

## 2018-05-24 DIAGNOSIS — D631 Anemia in chronic kidney disease: Secondary | ICD-10-CM | POA: Diagnosis present

## 2018-05-24 DIAGNOSIS — K219 Gastro-esophageal reflux disease without esophagitis: Secondary | ICD-10-CM | POA: Diagnosis present

## 2018-05-24 LAB — RAPID URINE DRUG SCREEN, HOSP PERFORMED
AMPHETAMINES: NOT DETECTED
BARBITURATES: NOT DETECTED
Benzodiazepines: NOT DETECTED
Cocaine: NOT DETECTED
OPIATES: NOT DETECTED
TETRAHYDROCANNABINOL: NOT DETECTED

## 2018-05-24 LAB — BASIC METABOLIC PANEL
ANION GAP: 8 (ref 5–15)
Anion gap: 7 (ref 5–15)
BUN: 27 mg/dL — ABNORMAL HIGH (ref 8–23)
BUN: 27 mg/dL — AB (ref 8–23)
CALCIUM: 9 mg/dL (ref 8.9–10.3)
CHLORIDE: 94 mmol/L — AB (ref 98–111)
CO2: 25 mmol/L (ref 22–32)
CO2: 26 mmol/L (ref 22–32)
CREATININE: 1.18 mg/dL — AB (ref 0.44–1.00)
Calcium: 8.9 mg/dL (ref 8.9–10.3)
Chloride: 95 mmol/L — ABNORMAL LOW (ref 98–111)
Creatinine, Ser: 1.19 mg/dL — ABNORMAL HIGH (ref 0.44–1.00)
GFR calc Af Amer: 54 mL/min — ABNORMAL LOW (ref >60)
GFR, EST AFRICAN AMERICAN: 54 mL/min — AB (ref >60)
GFR, EST NON AFRICAN AMERICAN: 47 mL/min — AB (ref >60)
GFR, EST NON AFRICAN AMERICAN: 47 mL/min — AB (ref >60)
GLUCOSE: 348 mg/dL — AB (ref 70–99)
Glucose, Bld: 445 mg/dL — ABNORMAL HIGH (ref 70–99)
Potassium: 4.8 mmol/L (ref 3.5–5.1)
Potassium: 5.5 mmol/L — ABNORMAL HIGH (ref 3.5–5.1)
SODIUM: 128 mmol/L — AB (ref 135–145)
SODIUM: 127 mmol/L — AB (ref 135–145)

## 2018-05-24 LAB — AMMONIA: Ammonia: 36 umol/L — ABNORMAL HIGH (ref 9–35)

## 2018-05-24 LAB — GLUCOSE, CAPILLARY
GLUCOSE-CAPILLARY: 378 mg/dL — AB (ref 70–99)
GLUCOSE-CAPILLARY: 450 mg/dL — AB (ref 70–99)
GLUCOSE-CAPILLARY: 363 mg/dL — AB (ref 70–99)
GLUCOSE-CAPILLARY: 356 mg/dL — AB (ref 70–99)

## 2018-05-24 LAB — VITAMIN B12: VITAMIN B 12: 864 pg/mL (ref 180–914)

## 2018-05-24 LAB — OSMOLALITY, URINE: OSMOLALITY UR: 179 mosm/kg — AB (ref 300–900)

## 2018-05-24 LAB — FOLATE: Folate: 32.5 ng/mL (ref >5.9)

## 2018-05-24 MED ORDER — INSULIN GLARGINE 100 UNIT/ML ~~LOC~~ SOLN
40.0000 [IU] | Freq: Every day | SUBCUTANEOUS | Status: DC
  Filled 2018-05-24: qty 0.4

## 2018-05-24 MED ORDER — INSULIN ASPART 100 UNIT/ML ~~LOC~~ SOLN
5.0000 [IU] | Freq: Once | SUBCUTANEOUS | Status: AC
  Administered 2018-05-24: 5 [IU] via SUBCUTANEOUS

## 2018-05-24 MED ORDER — INSULIN ASPART 100 UNIT/ML ~~LOC~~ SOLN
10.0000 [IU] | Freq: Once | SUBCUTANEOUS | Status: AC
  Administered 2018-05-24: 10 [IU] via SUBCUTANEOUS

## 2018-05-24 MED ORDER — INSULIN ASPART 100 UNIT/ML ~~LOC~~ SOLN: 0.0000 [IU] | Freq: Every day | SUBCUTANEOUS | Status: DC

## 2018-05-24 MED ORDER — INSULIN ASPART 100 UNIT/ML ~~LOC~~ SOLN: 10.0000 [IU] | Freq: Three times a day (TID) | SUBCUTANEOUS | Status: DC

## 2018-05-24 MED ORDER — DIPHENHYDRAMINE HCL 25 MG PO CAPS: 25.0000 mg | ORAL_CAPSULE | Freq: Four times a day (QID) | ORAL | Status: DC | PRN

## 2018-05-24 MED ORDER — SODIUM CHLORIDE 0.9 % IV SOLN
INTRAVENOUS | Status: AC
  Administered 2018-05-24 (×2): via INTRAVENOUS

## 2018-05-24 MED ORDER — ACETAMINOPHEN 325 MG PO TABS: 650.0000 mg | ORAL_TABLET | Freq: Four times a day (QID) | ORAL | Status: DC | PRN

## 2018-05-24 MED ORDER — INSULIN REGULAR HUMAN (CONC) 500 UNIT/ML ~~LOC~~ SOPN
50.0000 [IU] | PEN_INJECTOR | Freq: Three times a day (TID) | SUBCUTANEOUS | Status: DC
  Administered 2018-05-24 – 2018-05-25 (×3): 50 [IU] via SUBCUTANEOUS
  Filled 2018-05-24: qty 3

## 2018-05-24 MED ORDER — INSULIN ASPART 100 UNIT/ML ~~LOC~~ SOLN
0.0000 [IU] | Freq: Three times a day (TID) | SUBCUTANEOUS | Status: DC
  Administered 2018-05-24: 20 [IU] via SUBCUTANEOUS

## 2018-05-24 NOTE — Evaluation (Signed)
Physical Therapy Evaluation Patient Details Name: Penny Martinez MRN: [id number] DOB: 23-Nov-1950 Today's Date: 05/24/2018   History of Present Illness  Penny Martinez is a 67 y.o. female with medical history significant for DM2, Hepatic cirrhosis, HTN, obesity,  CHF, who rpesented to the ED with c/o burning with urination, lower abdominal paina nd back apin of ~1 month duration. PAtient wa sdiagnosed with a UTI ~1 month ago, sahe says he was treated with antibiotics- ?name and symptoms improved but quickly returned. She presented again to urgent care was diagnose dwith UTI, prescribed ciprofloxacin, which she took for a t least 3 days, maybe longer, and stopped when she started itching, which she attributed to the antibiotic. She has had presistent symptoms since then.    Clinical Impression  Patient functioning at baseline for functional mobility and gait.  Patient has tendency to lean on nearby objects for support which is baseline per patient and patient instructed in use of single point cane [facility]) with good return demonstrated and understanding acknowledged.  Plan:  Patient discharged from physical therapy to care of nursing for ambulation daily as tolerated for length of stay.     Follow Up Recommendations No PT follow up    Equipment Recommendations  Cane(single point cane)    Recommendations for Other Services       Precautions / Restrictions Precautions Precautions: None Restrictions Weight Bearing Restrictions: No      Mobility  Bed Mobility Overal bed mobility: Modified Independent             General bed mobility comments: has to use bed rail to pull self up  Transfers Overall transfer level: Modified independent Equipment used: None             General transfer comment: increased time  Ambulation/Gait Ambulation/Gait assistance: Modified independent (Device/Increase time) Gait Distance (Feet): 200 Feet Assistive device: None;Straight cane Gait  Pattern/deviations: Step-to pattern;Decreased step length - right;Decreased step length - left;Decreased stride length Gait velocity: decreased   General Gait Details: demonstrates good return for ambulation on level, inclined, and declined surfaces with occasiona use of side rails/walls without loss of balance and demonstrates mostly 3 point gait pattern using cane  Stairs Stairs: Yes Stairs assistance: Modified independent (Device/Increase time) Stair Management: One rail Left;One rail Right;Alternating pattern Number of Stairs: 9 General stair comments: demonstrates good return for going up/down stairs using 1 siderail with mostly alternating pattern without loss of balance  Wheelchair Mobility    Modified Rankin (Stroke Patients Only)       Balance Overall balance assessment: Mild deficits observed, not formally tested                                           Pertinent Vitals/Pain Pain Assessment: 0-10 Pain Score: 5  Pain Location: RUE due to pressure of blood pressure cuff Pain Descriptors / Indicators: Discomfort;Sore Pain Intervention(s): Monitored during session    Home Living Family/patient expects to be discharged to:: Private residence Living Arrangements: Spouse/significant other Available Help at Discharge: Family Type of Home: House Home Access: Stairs to enter Entrance Stairs-Rails: Right;Left;Can reach both Technical brewer of Steps: 3-4 Home Layout: Laundry or work area in basement;Multi-level [place]: Environmental consultant - 2 wheels      Prior Function Level of Independence: Needs assistance   Gait / Transfers Assistance Needed: household and short distanced community, usually lean on  walls/furniture at home and use cart to lean on when shopping  ADL's / Homemaking Assistance Needed: assisted by spouse        Hand Dominance        Extremity/Trunk Assessment   Upper Extremity Assessment Upper Extremity Assessment: Overall  WFL for tasks assessed    Lower Extremity Assessment Lower Extremity Assessment: Overall WFL for tasks assessed    Cervical / Trunk Assessment Cervical / Trunk Assessment: Normal  Communication   Communication: No difficulties  Cognition Arousal/Alertness: Awake/alert Behavior During Therapy: WFL for tasks assessed/performed Overall Cognitive Status: Within Functional Limits for tasks assessed                                        General Comments      Exercises     Assessment/Plan    PT Assessment Patent does not need any further PT services  PT Problem List         PT Treatment Interventions      PT Goals (Current goals can be found in the Care Plan section)  Acute Rehab PT Goals Patient Stated Goal: return home PT Goal Formulation: With patient Time For Goal Achievement: 05/24/18 Potential to Achieve Goals: Good    Frequency     Barriers to discharge        Co-evaluation               AM-PAC PT "6 Clicks" Daily Activity  Outcome Measure Difficulty turning over in bed (including adjusting bedclothes, sheets and blankets)?: None Difficulty moving from lying on back to sitting on the side of the bed? : None Difficulty sitting down on and standing up from a chair with arms (e.g., wheelchair, bedside commode, etc,.)?: None Help needed moving to and from a bed to chair (including a wheelchair)?: None Help needed walking in hospital room?: None Help needed climbing 3-5 steps with a railing? : None 6 Click Score: 24    End of Session   Activity Tolerance: Patient tolerated treatment well;Treatment limited secondary to medical complications (Comment) Patient left: in chair;with call bell/phone within reach Nurse Communication: Mobility status PT Visit Diagnosis: Unsteadiness on feet (R26.81);Other abnormalities of gait and mobility (R26.89);Muscle weakness (generalized) (M62.81)    Time: 1007-1219 PT Time Calculation (min) (ACUTE  ONLY): 27 min   Charges:   PT Evaluation $PT Eval Moderate Complexity: 1 Mod PT Treatments $Therapeutic Activity: 23-37 mins        8:46 AM, 05/24/18 Lonell Grandchild, MPT Physical Therapist with [facility] [phone] office [phone] mobile phone

## 2018-05-24 NOTE — Care Management (Signed)
Per PT eval pt needs cane. CM verified with pt that she does not already have cane and would like one ordered. She has no preference of provider. Okay with using [facility]. [name], [facility] rep, given referral.

## 2018-05-24 NOTE — Progress Notes (Signed)
PROGRESS NOTE  Penny Martinez:[id number] DOB: 27-Feb-1951 DOA: 05/23/2018 PCP: [name], MD  Brief History:  68 year old female with a history of diabetes mellitus type 2, NASH liver cirrhosis, hypertension, hyperlipidemia, hypothyroidism, COPD presenting with confusion, lethargy, and dizziness.  Patient states that her symptoms began on 05/20/2018.  The patient was having some gait instability and stated that she has having trouble remembering the time and date.  Apparently, the patient had enough insight to go to see her primary care provider on 05/23/2018.  From there, the patient was sent to the emergency department for further evaluation.  The patient states that she was diagnosed with UTI approximately 1 month prior to this admission.  She was treated with an antibiotic, but she had complained of continued dysuria.  She denies any worsening abdominal pain, fevers, chills, headache, neck pain, chest pain, shortness breath, nausea, vomiting, diarrhea.  However, the patient states that her CBGs have been in the 300s in the past week.  She endorses compliance with her insulin.  She denies any new medications.  She endorsed compliance with all her medications.  However, the patient states that she takes Benadryl as well as amitriptyline to help her sleep.  Upon presentation, the patient was afebrile hemodynamically stable with serum sodium of 121.  The patient was started on IV fluids and admitted for further evaluation.  Assessment/Plan: Acute metabolic encephalopathy -Likely secondary to hyponatremia -She is gradually improving with improvement of her sodium. -TSH 2.125 -Check serum B12 -Check folic acid -check ammonia -Holding gabapentin  -Urinalysis negative for pyuria  Hyponatremia -Likely secondary to solute diuresis secondary to her uncontrolled hyperglycemia resulting in volume depletion -Certainly, number of the medications may be contributing including benazepril,  spironolactone, amitriptyline  -baseline Na = 132-137  CKD stage 3 -baseline creatinine 1.1-1.3  Cognitive impairment -01/12/18 MOCA score 17/30 -continue aricept -[facility] neurology at [facility]  Hypothyroidism -Continue Synthroid -TSH 2.125  Diabetes mellitus type 2 -04/18/2018 hemoglobin A1c 7.1 -Continue Lantus and resistant NovoLog sliding scale -The patient normally takes humulin U500, 100 units tiw  Liver cirrhosis -Holding furosemide and spironolactone temporarily due to volume depletion -check ammonia  Essential hypertension -Continue benazepril  Hyperlipidemia -Continue Zetia   Disposition Plan:   Home 05/25/18 if stable Family Communication: No  Family at bedside  Consultants:  none  Code Status:  FULL   DVT Prophylaxis:  SCDs   Procedures: As Listed in Progress Note Above  Antibiotics: None    Subjective: Patient states that she feels slightly dizzy but it is improving.  She still feels like confused but it is improving.  She denies any headache, chest pain, shortness breath, nausea, vomiting, diarrhea, abdominal pain.  She has some dysuria.  There is no rashes.  She denies any visual disturbance or focal extremity weakness.  Objective: Vitals:   05/23/18 1755 05/23/18 2058 05/23/18 2303 05/24/18 0626  BP: (!) 102/58 (!) 106/51  (!) 105/38  Pulse: (!) 108 92  89  Resp: 18 18  18   Temp: 98 F (36.7 C) 98.2 F (36.8 C)  97.8 F (36.6 C)  TempSrc: Oral Oral  Oral  SpO2: 98% 96% 96% 97%    Intake/Output Summary (Last 24 hours) at 05/24/2018 0853 Last data filed at 05/24/2018 0700 Gross per 24 hour  Intake 1574.16 ml  Output -  Net 1574.16 ml   Weight change:  Exam:   General:  Pt is alert, follows commands appropriately, not  in acute distress  HEENT: No icterus, No thrush, No neck mass, [place]/AT  Cardiovascular: RRR, S1/S2, no rubs, no gallops  Respiratory: CTA bilaterally, no wheezing, no crackles, no rhonchi  Abdomen: Soft/+BS, non  tender, non distended, no guarding  Extremities: No edema, No lymphangitis, No petechiae, No rashes, no synovitis   Data Reviewed: I have personally reviewed following labs and imaging studies Basic Metabolic Panel: Recent Labs  Lab 05/17/18 1202 05/23/18 1445 05/23/18 2038 05/24/18 0519  NA 131* 121* 124* 128*  K 4.8 4.8 4.9 4.8  CL 93* 86* 90* 95*  CO2 29 27 27 25   GLUCOSE 290* 217* 243* 348*  BUN 26* 30* 28* 27*  CREATININE 1.36* 1.30* 1.35* 1.19*  CALCIUM 9.0 9.5 8.9 9.0   Liver Function Tests: Recent Labs  Lab 05/17/18 1202 05/23/18 1445  AST 30 40  ALT 22 26  ALKPHOS 126 103  BILITOT 0.6 0.8  PROT 7.0 7.6  ALBUMIN 3.6 4.0   No results for input(s): LIPASE, AMYLASE in the last 168 hours. Recent Labs  Lab 05/23/18 1445  AMMONIA 38*   Coagulation Profile: No results for input(s): INR, PROTIME in the last 168 hours. CBC: Recent Labs  Lab 05/17/18 1202 05/23/18 1445  WBC 5.0 5.7  NEUTROABS 3.5 4.1  HGB 10.1* 10.2*  HCT 32.9* 31.3*  MCV 86.8 82.2  PLT 93* 87*   Cardiac Enzymes: No results for input(s): CKTOTAL, CKMB, CKMBINDEX, TROPONINI in the last 168 hours. BNP: Invalid input(s): POCBNP CBG: Recent Labs  Lab 05/23/18 1420 05/23/18 1749 05/23/18 2100 05/24/18 0738  GLUCAP 232* 158* 233* 378*   HbA1C: No results for input(s): HGBA1C in the last 72 hours. Urine analysis:    Component Value Date/Time   COLORURINE YELLOW 05/23/2018 1310   APPEARANCEUR HAZY (A) 05/23/2018 1310   LABSPEC 1.012 05/23/2018 1310   PHURINE 5.0 05/23/2018 1310   GLUCOSEU NEGATIVE 05/23/2018 1310   HGBUR NEGATIVE 05/23/2018 1310   BILIRUBINUR NEGATIVE 05/23/2018 1310   KETONESUR NEGATIVE 05/23/2018 1310   PROTEINUR NEGATIVE 05/23/2018 1310   UROBILINOGEN 0.2 03/05/2014 2140   NITRITE NEGATIVE 05/23/2018 1310   LEUKOCYTESUR NEGATIVE 05/23/2018 1310   Sepsis Labs: @LABRCNTIP (procalcitonin:4,lacticidven:4) )No results found for this or any previous visit  (from the past 240 hour(s)).   Scheduled Meds: . amitriptyline  25 mg Oral QHS  . aspirin  81 mg Oral BH-q7a  . benazepril  20 mg Oral q morning - 10a  . donepezil  5 mg Oral QHS  . ezetimibe  10 mg Oral q morning - 10a  . Influenza vac split quadrivalent PF  0.5 mL Intramuscular Tomorrow-1000  . insulin aspart  0-9 Units Subcutaneous TID WC  . insulin glargine  20 Units Subcutaneous QHS  . levothyroxine  100 mcg Oral QAC breakfast  . pantoprazole  40 mg Oral Daily  . pneumococcal 23 valent vaccine  0.5 mL Intramuscular Tomorrow-1000   Continuous Infusions:  Procedures/Studies: Dg Chest 2 View  Result Date: 05/23/2018 CLINICAL DATA:  Chills, cough, altered mental status. Possible UTI. History of COPD, CHF, diabetes, former smoker. EXAM: CHEST - 2 VIEW COMPARISON:  PA and lateral chest x-ray of September 01, 2018 FINDINGS: The lungs are adequately inflated. There is no focal infiltrate. There is no pleural effusion. The interstitial markings are coarse though stable. There is calcification in the wall of the thoracic aorta. The heart and pulmonary vascularity are normal. The bony thorax exhibits no acute abnormality. IMPRESSION: Chronic bronchitic changes. No alveolar pneumonia, CHF,  nor other acute cardiopulmonary abnormality. Thoracic aortic atherosclerosis. Electronically Signed   By: Jayjay Littles  [place] M.D.   On: 05/23/2018 15:14   Ct Head Wo Contrast  Result Date: 05/23/2018 CLINICAL DATA:  Memory loss and altered mental status. EXAM: CT HEAD WITHOUT CONTRAST TECHNIQUE: Contiguous axial images were obtained from the base of the skull through the vertex without intravenous contrast. COMPARISON:  None. FINDINGS: Brain: There is no mass, hemorrhage or extra-axial collection. The size and configuration of the ventricles and extra-axial CSF spaces are normal. There is no acute or chronic infarction. There is hypoattenuation of the periventricular white matter, most commonly indicating chronic  ischemic microangiopathy. Vascular: No abnormal hyperdensity of the major intracranial arteries or dural venous sinuses. No intracranial atherosclerosis. Skull: The visualized skull base, calvarium and extracranial soft tissues are normal. Sinuses/Orbits: No fluid levels or advanced mucosal thickening of the visualized paranasal sinuses. No mastoid or middle ear effusion. The orbits are normal. IMPRESSION: Chronic ischemic microangiopathy without acute intracranial abnormality. Electronically Signed   By: Deatra Robinson M.D.   On: 05/23/2018 15:29    Catarina Hartshorn, DO  [facility] Pager [phone]  If 7PM-7AM, please contact night-coverage [email or web address] Password [email or web address] 05/24/2018, 8:53 AM   LOS: 0 days

## 2018-05-24 NOTE — Progress Notes (Addendum)
Inpatient Diabetes Program Recommendations  AACE/ADA: New Consensus Statement on Inpatient Glycemic Control (2015)  Target Ranges:  Prepandial:   less than 140 mg/dL      Peak postprandial:   less than 180 mg/dL (1-2 hours)      Critically ill patients:  140 - 180 mg/dL   Results for BEULAH, CAPOBIANCO (MRN [id number]) as of 05/24/2018 15:29  Ref. Range 05/23/2018 21:00 05/24/2018 07:38 05/24/2018 11:32  Glucose-Capillary Latest Ref Range: 70 - 99 mg/dL 233 (H) 378 (H) 450 (H)   Review of Glycemic Control  Diabetes history: DM 2 Outpatient Diabetes medications: Concentrated Humulin R U-500 100 units tid, Victoza 1.8 mg Daily Current orders for Inpatient glycemic control: Lantus 40 units QHS, Novolog 0-20 units tid, Novolog 0-5 units qhs, Novolog 10 units tid meal coverage  Inpatient Diabetes Program Recommendations:    Glucose increased to 450 at lunch. Patient takes concentrated Humulin R U-500 insulin at home. Consider reordering a portion of patient's U-500 dose 50 units tid while inpatient. D/c basal insulin and meal coverage if ordered.  Paged Dr. Carles Collet with recs.  Thanks,  Tama Headings RN, MSN, BC-ADM Inpatient Diabetes Coordinator Team Pager [phone] (8a-5p)

## 2018-05-24 NOTE — Progress Notes (Signed)
CRITICAL VALUE ALERT  Critical Value:  CBG was 450  Date & Time Notied:  05/24/2018 at 1135  Provider Notified: Dr. Carles Collet  Orders Received/Actions taken: Administer 30 units of Novolog

## 2018-05-25 DIAGNOSIS — D696 Thrombocytopenia, unspecified: Secondary | ICD-10-CM

## 2018-05-25 DIAGNOSIS — E871 Hypo-osmolality and hyponatremia: Principal | ICD-10-CM

## 2018-05-25 DIAGNOSIS — I10 Essential (primary) hypertension: Secondary | ICD-10-CM

## 2018-05-25 DIAGNOSIS — E1165 Type 2 diabetes mellitus with hyperglycemia: Secondary | ICD-10-CM

## 2018-05-25 DIAGNOSIS — N183 Chronic kidney disease, stage 3 (moderate): Secondary | ICD-10-CM

## 2018-05-25 DIAGNOSIS — E1122 Type 2 diabetes mellitus with diabetic chronic kidney disease: Secondary | ICD-10-CM

## 2018-05-25 DIAGNOSIS — E118 Type 2 diabetes mellitus with unspecified complications: Secondary | ICD-10-CM

## 2018-05-25 LAB — BASIC METABOLIC PANEL
Anion gap: 6 (ref 5–15)
BUN: 25 mg/dL — AB (ref 8–23)
CHLORIDE: 100 mmol/L (ref 98–111)
CO2: 28 mmol/L (ref 22–32)
Calcium: 8.9 mg/dL (ref 8.9–10.3)
Creatinine, Ser: 1.11 mg/dL — ABNORMAL HIGH (ref 0.44–1.00)
GFR calc Af Amer: 59 mL/min — ABNORMAL LOW (ref >60)
GFR calc non Af Amer: 51 mL/min — ABNORMAL LOW (ref >60)
GLUCOSE: 184 mg/dL — AB (ref 70–99)
POTASSIUM: 4.8 mmol/L (ref 3.5–5.1)
SODIUM: 134 mmol/L — AB (ref 135–145)

## 2018-05-25 LAB — GLUCOSE, CAPILLARY
GLUCOSE-CAPILLARY: 223 mg/dL — AB (ref 70–99)
GLUCOSE-CAPILLARY: 304 mg/dL — AB (ref 70–99)

## 2018-05-25 LAB — HIV ANTIBODY (ROUTINE TESTING W REFLEX): HIV SCREEN 4TH GENERATION: NONREACTIVE

## 2018-05-25 LAB — URINE CULTURE: SPECIAL REQUESTS: NORMAL

## 2018-05-25 MED ORDER — DIPHENOXYLATE-ATROPINE 2.5-0.025 MG PO TABS
1.0000 | ORAL_TABLET | Freq: Four times a day (QID) | ORAL | Status: DC
  Administered 2018-05-25: 1 via ORAL
  Filled 2018-05-25: qty 1

## 2018-05-25 MED ORDER — POTASSIUM CHLORIDE ER 20 MEQ PO TBCR: 20.0000 meq | EXTENDED_RELEASE_TABLET | Freq: Every day | ORAL | 0 refills | Status: DC

## 2018-05-25 NOTE — Discharge Summary (Signed)
Physician Discharge Summary  Penny Martinez QPY:[id number] DOB: Aug 24, 1951 DOA: 05/23/2018  PCP: Vidal Schwalbe, MD  Admit date: 05/23/2018 Discharge date: 05/25/2018  Admitted From: home Disposition:  home  Recommendations for Outpatient Follow-up:  1. Follow up with PCP in 1-2 weeks 2. Please obtain BMP in one week to follow renal function and sodium  Discharge Condition:stable CODE STATUS:full code Diet recommendation: heart healthy, carb modified  Brief/Interim Summary: 67 year old female with a history of diabetes mellitus type 2, NASH liver cirrhosis, hypertension, hyperlipidemia, hypothyroidism, COPD presenting with confusion, lethargy, and dizziness.  Patient states that her symptoms began on 05/20/2018.  The patient was having some gait instability and stated that she has having trouble remembering the time and date.  Apparently, the patient had enough insight to go to see her primary care provider on 05/23/2018.  From there, the patient was sent to the emergency department for further evaluation.  The patient states that she was diagnosed with UTI approximately 1 month prior to this admission.  She was treated with an antibiotic, but she had complained of continued dysuria.  She denies any worsening abdominal pain, fevers, chills, headache, neck pain, chest pain, shortness breath, nausea, vomiting, diarrhea.  However, the patient states that her CBGs have been in the 300s in the past week.  She endorses compliance with her insulin.  She denies any new medications.  She endorsed compliance with all her medications.  However, the patient states that she takes Benadryl as well as amitriptyline to help her sleep.  Upon presentation, the patient was afebrile hemodynamically stable with serum sodium of 121.  The patient was started on IV fluids and admitted for further evaluation.  Discharge Diagnoses:  Principal Problem:   Hyponatremia Active Problems:   Diabetes mellitus with stage 3  chronic kidney disease (HCC)   CHF (congestive heart failure) (HCC)   Hepatic cirrhosis (HCC)   Essential hypertension, benign   Other specified hypothyroidism   Thrombocytopenia ([place])   Uncontrolled type 2 diabetes mellitus with complication ([place])   CKD stage 3 due to type 2 diabetes mellitus (HCC)   Hyponatremia syndrome  Acute metabolic encephalopathy -Likely secondary to hyponatremia -She is gradually improving with improvement of her sodium. -TSH 2.125 -b12, folate and ammonia unremarkable -Holding gabapentin/baclofen  -Urinalysis negative for pyuria -mental status appears to be back to baseline  Hyponatremia -likely related to hypovolemia -improved with administration of IV saline  CKD stage 3 -baseline creatinine 1.1-1.3  Cognitive impairment -01/12/18 MOCA score 17/30 -continue aricept -follows neurology at [facility]  Hypothyroidism -Continue Synthroid -TSH 2.125  Diabetes mellitus type 2 -04/18/2018 hemoglobin A1c 7.1 -resume home insulin regimen on discharge  Liver cirrhosis -diuretics initially held due to hypovolemia and hyponatremia -resume lasix on discharge -ammonia level unremarkable  Essential hypertension -Continue benazepril  Hyperlipidemia -Continue Zetia  Discharge Instructions  Discharge Instructions    Diet - low sodium heart healthy   Complete by:  As directed    Increase activity slowly   Complete by:  As directed      Allergies as of 05/25/2018      Reactions   Erythromycin Hives   Niacin Rash, Shortness Of Breath   Penicillins Shortness Of Breath, Other (See Comments)   Has patient had a PCN reaction causing immediate rash, facial/tongue/throat swelling, SOB or lightheadedness with hypotension: Yes Has patient had a PCN reaction causing severe rash involving mucus membranes or skin necrosis: No Has patient had a PCN reaction that required hospitalization: No Has patient had  a PCN reaction occurring within the last 10  years: No If all of the above answers are "NO", then may proceed with Cephalosporin use.   Sulfa Antibiotics Hives   Ciprofloxacin Itching, Nausea And Vomiting   Stomach pain   Statins Other (See Comments)   Muscle Pain   Codeine Itching      Medication List    STOP taking these medications   baclofen 10 MG tablet Commonly known as:  LIORESAL   benazepril 20 MG tablet Commonly known as:  LOTENSIN   ciprofloxacin 500 MG tablet Commonly known as:  CIPRO   gabapentin 100 MG capsule Commonly known as:  NEURONTIN   spironolactone 25 MG tablet Commonly known as:  ALDACTONE     TAKE these medications   amitriptyline 25 MG tablet Commonly known as:  ELAVIL Take 25 mg by mouth at bedtime.   ARICEPT 5 MG tablet Generic drug:  donepezil Take 5 mg by mouth at bedtime.   aspirin 81 MG tablet Take 81 mg by mouth every morning.   Azelastine HCl 137 MCG/SPRAY Soln Place 1 spray into both nostrils 2 (two) times daily as needed (for congestion).   CENTRUM SILVER ADULT 50+ PO Take 1 tablet by mouth daily.   CRANBERRY PO Take 1 capsule by mouth at bedtime.   diphenhydrAMINE 25 MG tablet Commonly known as:  SOMINEX Take 50 mg by mouth at bedtime.   diphenoxylate-atropine 2.5-0.025 MG tablet Commonly known as:  LOMOTIL 1-2 tablets every 4-6 hours as needed for diarrhea. What changed:    how much to take  how to take this  when to take this  reasons to take this  additional instructions   ezetimibe 10 MG tablet Commonly known as:  ZETIA Take 10 mg by mouth every morning.   furosemide 80 MG tablet Commonly known as:  LASIX Take 80 mg by mouth every morning.   HUMULIN R U-500 KWIKPEN 500 UNIT/ML kwikpen Generic drug:  insulin regular human CONCENTRATED INJECT 130 UNITS S.Q. THREE TIMES DAILY WITH MEALS. What changed:  See the new instructions.   lansoprazole 30 MG capsule Commonly known as:  PREVACID Take 30 mg by mouth every morning.   levothyroxine 100  MCG tablet Commonly known as:  SYNTHROID, LEVOTHROID TAKE 1 TABLET BY MOUTH ONCE DAILY. What changed:  when to take this   phenazopyridine 100 MG tablet Commonly known as:  PYRIDIUM Take 200 mg by mouth 3 (three) times daily as needed for pain.   Potassium Chloride ER 20 MEQ Tbcr Take 20 mEq by mouth daily.   VICTOZA 18 MG/3ML Sopn Generic drug:  liraglutide INJECT 1.8MG S.Q. ONCE DAILY What changed:  See the new instructions.   Vitamin D (Ergocalciferol) 50000 units Caps capsule Commonly known as:  DRISDOL Take 50,000 Units by mouth every 30 (thirty) days.   ZANTAC PO Take 1 tablet by mouth at bedtime.       Allergies  Allergen Reactions  . Erythromycin Hives  . Niacin Rash and Shortness Of Breath  . Penicillins Shortness Of Breath and Other (See Comments)    Has patient had a PCN reaction causing immediate rash, facial/tongue/throat swelling, SOB or lightheadedness with hypotension: Yes Has patient had a PCN reaction causing severe rash involving mucus membranes or skin necrosis: No Has patient had a PCN reaction that required hospitalization: No Has patient had a PCN reaction occurring within the last 10 years: No If all of the above answers are "NO", then may proceed with Cephalosporin  use.   . Sulfa Antibiotics Hives  . Ciprofloxacin Itching and Nausea And Vomiting    Stomach pain  . Statins Other (See Comments)    Muscle Pain  . Codeine Itching    Consultations:     Procedures/Studies: Dg Chest 2 View  Result Date: 05/23/2018 CLINICAL DATA:  Chills, cough, altered mental status. Possible UTI. History of COPD, CHF, diabetes, former smoker. EXAM: CHEST - 2 VIEW COMPARISON:  PA and lateral chest x-ray of September 01, 2018 FINDINGS: The lungs are adequately inflated. There is no focal infiltrate. There is no pleural effusion. The interstitial markings are coarse though stable. There is calcification in the wall of the thoracic aorta. The heart and pulmonary  vascularity are normal. The bony thorax exhibits no acute abnormality. IMPRESSION: Chronic bronchitic changes. No alveolar pneumonia, CHF, nor other acute cardiopulmonary abnormality. Thoracic aortic atherosclerosis. Electronically Signed   By: David  Martinique M.D.   On: 05/23/2018 15:14   Ct Head Wo Contrast  Result Date: 05/23/2018 CLINICAL DATA:  Memory loss and altered mental status. EXAM: CT HEAD WITHOUT CONTRAST TECHNIQUE: Contiguous axial images were obtained from the base of the skull through the vertex without intravenous contrast. COMPARISON:  None. FINDINGS: Brain: There is no mass, hemorrhage or extra-axial collection. The size and configuration of the ventricles and extra-axial CSF spaces are normal. There is no acute or chronic infarction. There is hypoattenuation of the periventricular white matter, most commonly indicating chronic ischemic microangiopathy. Vascular: No abnormal hyperdensity of the major intracranial arteries or dural venous sinuses. No intracranial atherosclerosis. Skull: The visualized skull base, calvarium and extracranial soft tissues are normal. Sinuses/Orbits: No fluid levels or advanced mucosal thickening of the visualized paranasal sinuses. No mastoid or middle ear effusion. The orbits are normal. IMPRESSION: Chronic ischemic microangiopathy without acute intracranial abnormality. Electronically Signed   By: Ulyses Jarred M.D.   On: 05/23/2018 15:29       Subjective: Having some loose stools, which she describes is normal for her IBS. Denies any other complaints.  Discharge Exam: Vitals:   05/24/18 2114 05/24/18 2147 05/25/18 0627 05/25/18 1524  BP:  (!) 114/51 (!) 103/34 (!) 122/40  Pulse:  85 86 87  Resp:  18 18 18   Temp:  97.6 F (36.4 C) 97.7 F (36.5 C) 98.1 F (36.7 C)  TempSrc:  Oral Oral Oral  SpO2: 95% 99% 100% 100%    General: Pt is alert, awake, not in acute distress Cardiovascular: RRR, S1/S2 +, no rubs, no gallops Respiratory: CTA  bilaterally, no wheezing, no rhonchi Abdominal: Soft, NT, ND, bowel sounds + Extremities: no edema, no cyanosis    The results of significant diagnostics from this hospitalization (including imaging, microbiology, ancillary and laboratory) are listed below for reference.     Microbiology: Recent Results (from the past 240 hour(s))  Urine culture     Status: Abnormal   Collection Time: 05/23/18  1:10 PM  Result Value Ref Range Status   Specimen Description   Final    URINE, CATHETERIZED Performed at [facility], [address], [place], [place] [address]    Special Requests   Final    Normal Performed at [facility], [address], [place], [place] [address]    Culture MULTIPLE SPECIES PRESENT, SUGGEST RECOLLECTION (A)  Final   Report Status 05/25/2018 FINAL  Final     Labs: BNP (last 3 results) No results for input(s): BNP in the last 8760 hours. Basic Metabolic Panel: Recent Labs  Lab 05/23/18  1445 05/23/18 2038 05/24/18 0519 05/24/18 1158 05/25/18 0543  NA 121* 124* 128* 127* 134*  K 4.8 4.9 4.8 5.5* 4.8  CL 86* 90* 95* 94* 100  CO2 27 27 25 26 28   GLUCOSE 217* 243* 348* 445* 184*  BUN 30* 28* 27* 27* 25*  CREATININE 1.30* 1.35* 1.19* 1.18* 1.11*  CALCIUM 9.5 8.9 9.0 8.9 8.9   Liver Function Tests: Recent Labs  Lab 05/23/18 1445  AST 40  ALT 26  ALKPHOS 103  BILITOT 0.8  PROT 7.6  ALBUMIN 4.0   No results for input(s): LIPASE, AMYLASE in the last 168 hours. Recent Labs  Lab 05/23/18 1445 05/24/18 1158  AMMONIA 38* 36*   CBC: Recent Labs  Lab 05/23/18 1445  WBC 5.7  NEUTROABS 4.1  HGB 10.2*  HCT 31.3*  MCV 82.2  PLT 87*   Cardiac Enzymes: No results for input(s): CKTOTAL, CKMB, CKMBINDEX, TROPONINI in the last 168 hours. BNP: Invalid input(s): POCBNP CBG: Recent Labs  Lab 05/24/18 1132 05/24/18 1640 05/24/18 2202 05/25/18 0728 05/25/18 1120  GLUCAP 450* 363* 356* 223* 304*   D-Dimer No results for input(s): DDIMER in the last  72 hours. Hgb A1c No results for input(s): HGBA1C in the last 72 hours. Lipid Profile No results for input(s): CHOL, HDL, LDLCALC, TRIG, CHOLHDL, LDLDIRECT in the last 72 hours. Thyroid function studies Recent Labs    05/23/18 1631  TSH 2.125   Anemia work up Recent Labs    05/24/18 1158  VITAMINB12 864  FOLATE 32.5   Urinalysis    Component Value Date/Time   COLORURINE YELLOW 05/23/2018 1310   APPEARANCEUR HAZY (A) 05/23/2018 1310   LABSPEC 1.012 05/23/2018 1310   PHURINE 5.0 05/23/2018 1310   GLUCOSEU NEGATIVE 05/23/2018 1310   HGBUR NEGATIVE 05/23/2018 1310   BILIRUBINUR NEGATIVE 05/23/2018 1310   KETONESUR NEGATIVE 05/23/2018 1310   PROTEINUR NEGATIVE 05/23/2018 1310   UROBILINOGEN 0.2 03/05/2014 2140   NITRITE NEGATIVE 05/23/2018 1310   LEUKOCYTESUR NEGATIVE 05/23/2018 1310   Sepsis Labs Invalid input(s): PROCALCITONIN,  WBC,  LACTICIDVEN Microbiology Recent Results (from the past 240 hour(s))  Urine culture     Status: Abnormal   Collection Time: 05/23/18  1:10 PM  Result Value Ref Range Status   Specimen Description   Final    URINE, CATHETERIZED Performed at [facility], [address] [place], [place] [address]    Special Requests   Final    Normal Performed at [facility], [address] [place], [place] [address]    Culture MULTIPLE SPECIES PRESENT, SUGGEST RECOLLECTION (A)  Final   Report Status 05/25/2018 FINAL  Final     Time coordinating discharge: 67mns  SIGNED:   JKathie Dike MD  [facility] 05/25/2018, 7:58 PM Pager   If 7PM-7AM, please contact night-coverage [email or web address] Password [email or web address]

## 2018-05-25 NOTE — Progress Notes (Signed)
Inpatient Diabetes Program Recommendations  AACE/ADA: New Consensus Statement on Inpatient Glycemic Control (2015)  Target Ranges:  Prepandial:   less than 140 mg/dL      Peak postprandial:   less than 180 mg/dL (1-2 hours)      Critically ill patients:  140 - 180 mg/dL   Lab Results  Component Value Date   GLUCAP 304 (H) 05/25/2018   HGBA1C 7.1 (H) 04/18/2018    Review of Glycemic Control Results for Penny Martinez, Penny Martinez (MRN [id number]) as of 05/25/2018 14:34  Ref. Range 05/24/2018 16:40 05/24/2018 22:02 05/25/2018 07:28 05/25/2018 11:20  Glucose-Capillary Latest Ref Range: 70 - 99 mg/dL 363 (H) 356 (H) 223 (H) 304 (H)   Diabetes history: DM 2 Outpatient Diabetes medications: Concentrated Humulin R U-500 100 units tid, Victoza 1.8 mg Daily Current orders for Inpatient glycemic control: Humulin R U-500 50 units TID  Inpatient Diabetes Program Recommendations:    Consider increasing Humulin R U-500 to 75 units TID.  Thanks, Bronson Curb, MSN, RNC-OB Diabetes Coordinator [phone] (8a-5p)

## 2018-05-31 ENCOUNTER — Telehealth: Payer: Self-pay

## 2018-05-31 IMAGING — US US ABDOMEN COMPLETE
1 series · 14 of 25 positions shown · non-contrast
Comparison: Abdominal ultrasound August 16, 2015

CLINICAL DATA: Hepatic cirrhosis ; previous cholecystectomy.
History of hypertension and diabetes.

EXAM:
ABDOMEN ULTRASOUND COMPLETE

[Series 1: us abdomen complete · 0.24mm/px · 14 of 88 slices shown]
[im 1/88]
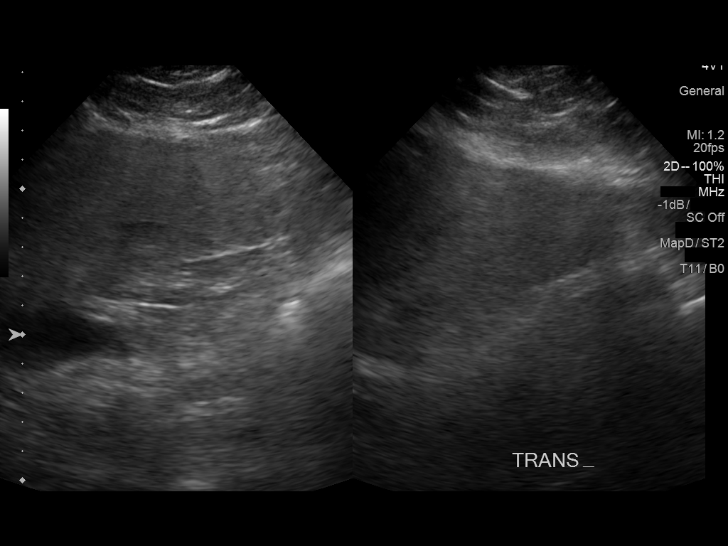
[im 8/88]
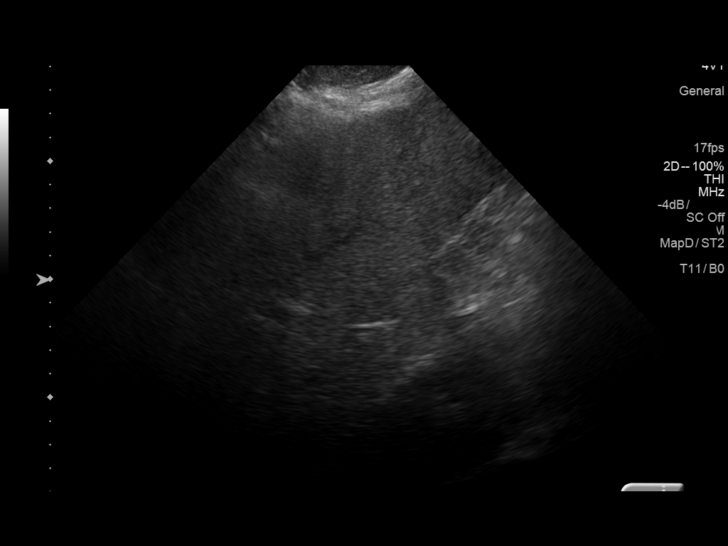
[im 15/88]
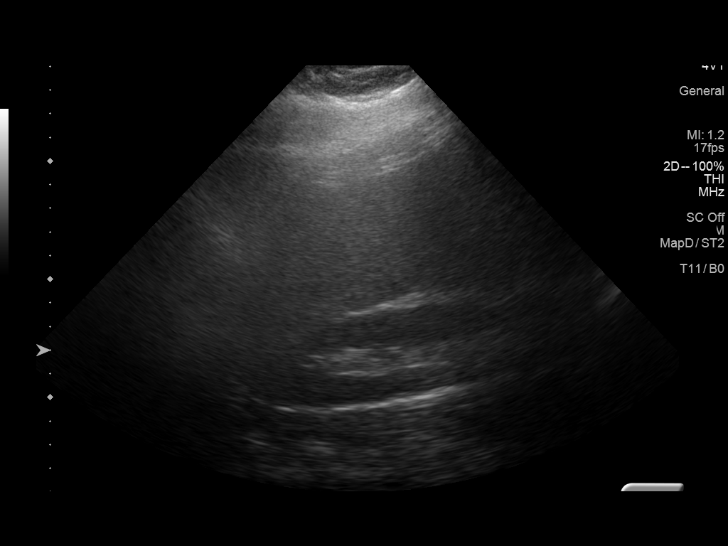
[im 22/88]
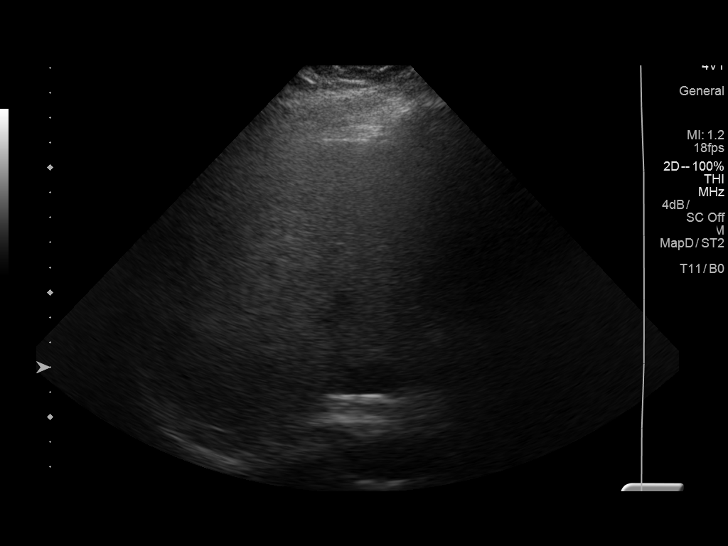
[im 30/88]
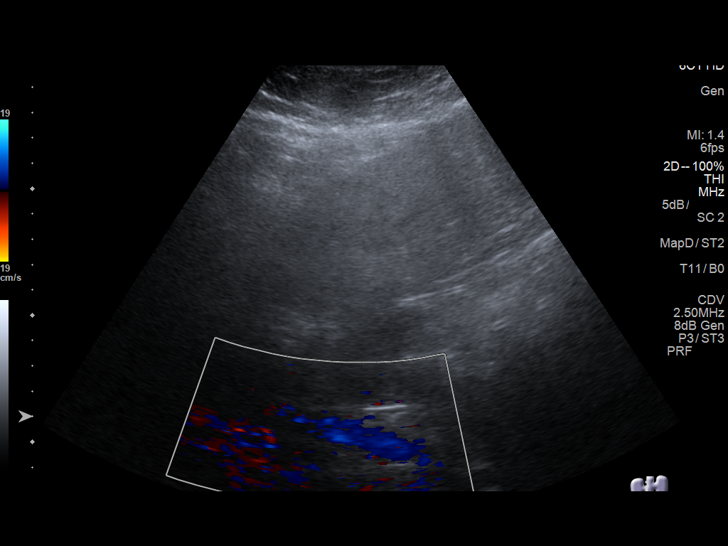
[im 33/88]
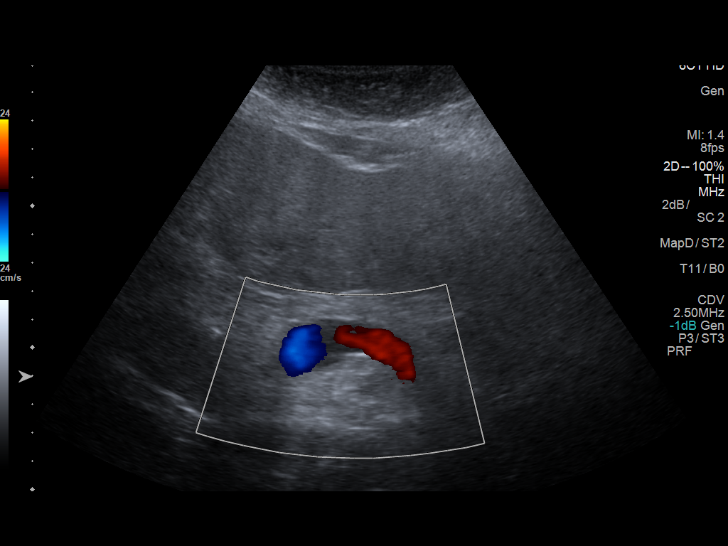
[im 40/88]
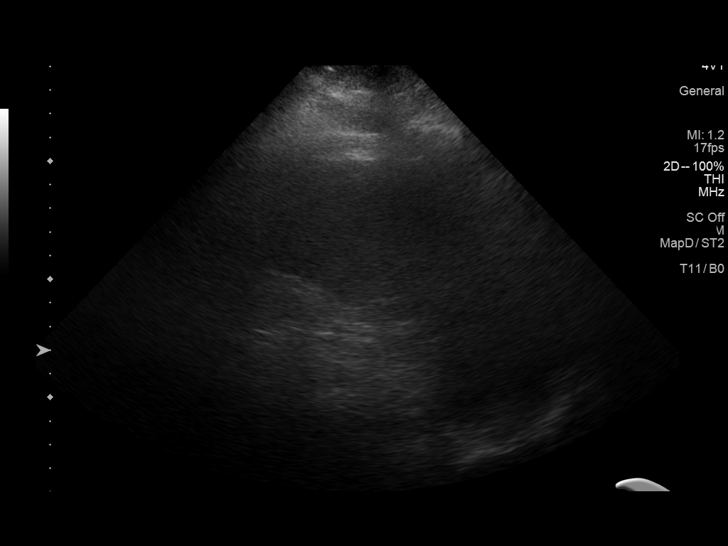
[im 48/88]
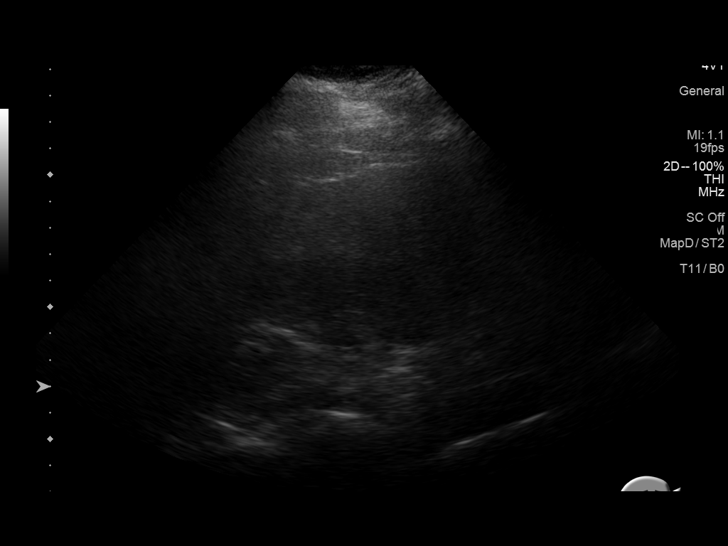
[im 55/88]
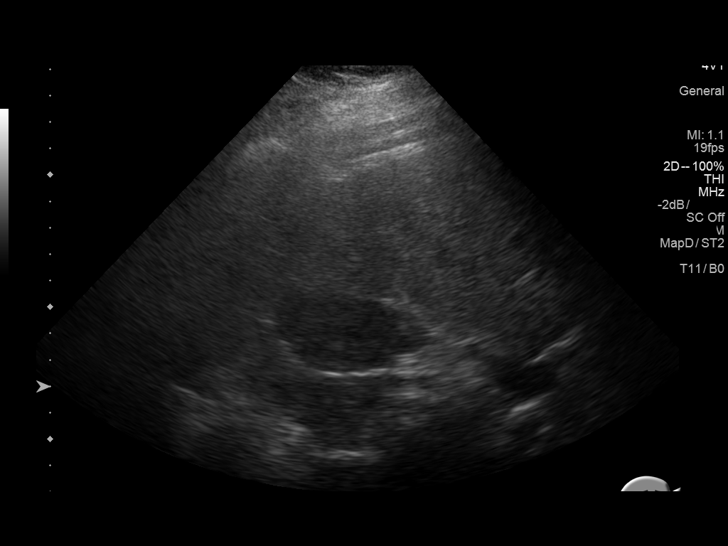
[im 59/88]
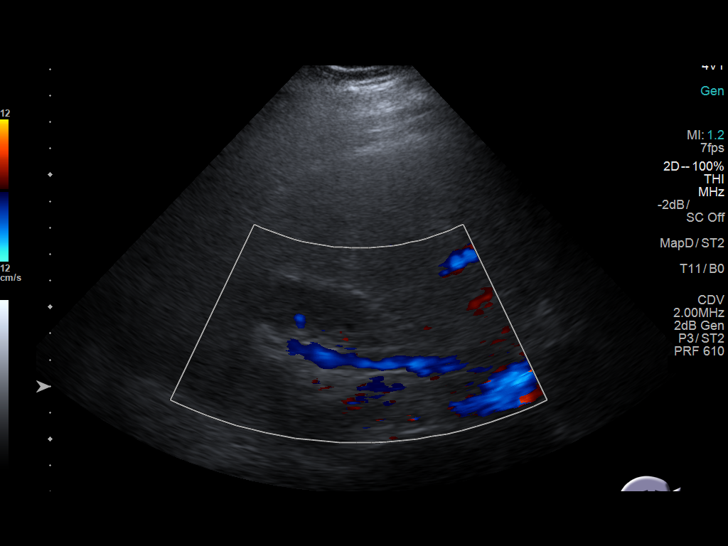
[im 66/88]
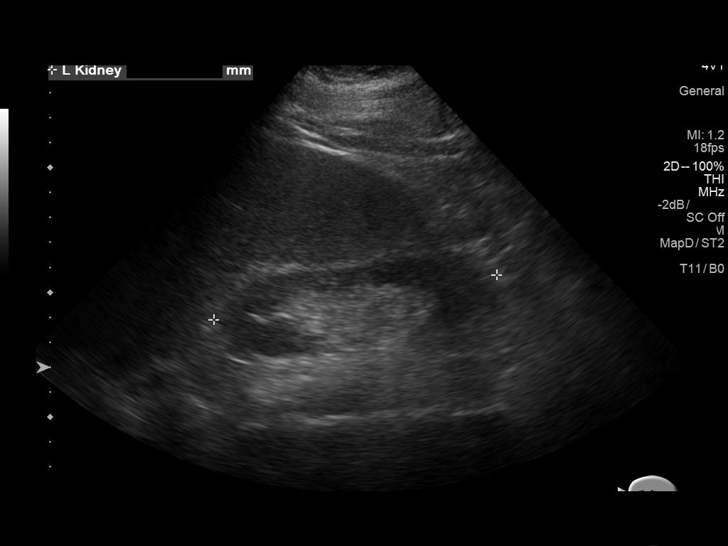
[im 73/88]
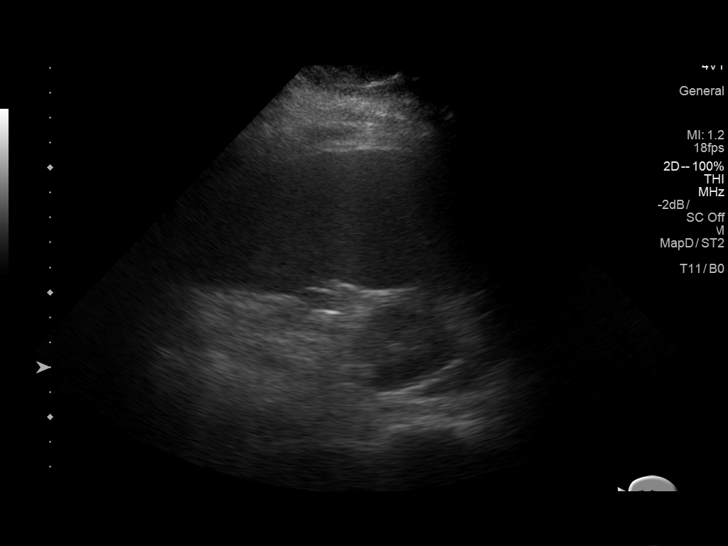
[im 80/88]
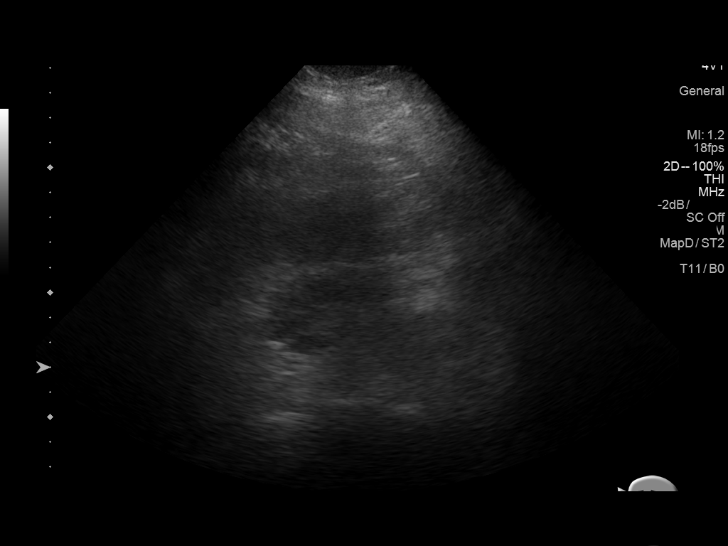
[im 88/88]
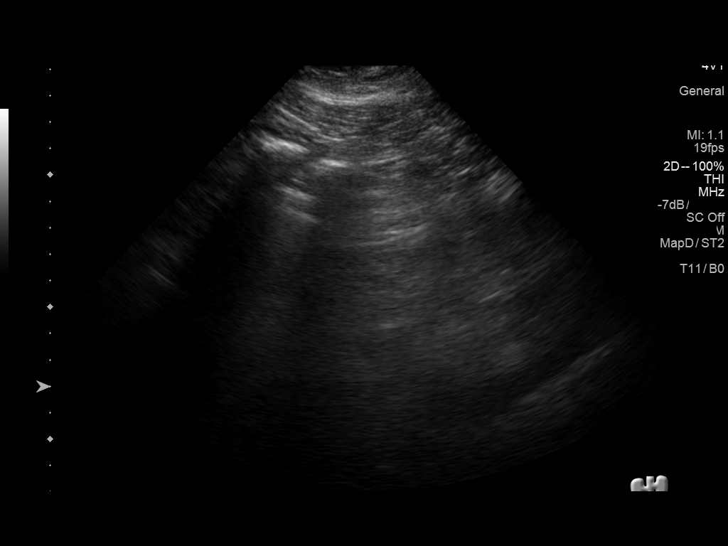

[14 of 25 positions shown; findings below may reference images not displayed]

FINDINGS: Bowel gas limits the image quality.

Gallbladder: The gallbladder is surgically absent.

Common bile duct: Diameter: 4.4 mm

Liver: The hepatic echotexture is heterogeneous Kasthu increased. The
surface contour is fairly smooth. There is no focal mass nor
intrahepatic ductal dilation.

IVC: Bowel gas limits evaluation of the IVC.

Pancreas: Visualized portion unremarkable.

Spleen: There is splenomegaly with splenic dimensions of 16.5 x 16 x
8.1 with calculated volume of 6666 cc.

Right Kidney: Length: 13.2 cm. Echogenicity within normal limits. No
mass or hydronephrosis visualized.

Left Kidney: Length: 11.5 cm. Echogenicity within normal limits. No
mass or hydronephrosis visualized.

Abdominal aorta: No aneurysm visualized.

Other findings: No ascites is observed.
IMPRESSION: Cirrhotic changes within the liver, stable.  No suspicious masses.

Splenomegaly with increased splenic volume since the previous study.
No ascites.

Previous cholecystectomy.

## 2018-05-31 NOTE — Telephone Encounter (Signed)
Lower U500 to 90 units TIDAC.

## 2018-05-31 NOTE — Telephone Encounter (Signed)
Pt states she has had low BG readings.   Date Before breakfast Before lunch Before supper Bedtime  9/21 96 48 211 46  9/22 82 117 54 60  9/23 116 162 112           Pt taking: U-500 100 units tidac

## 2018-05-31 NOTE — Telephone Encounter (Signed)
Pt.notified

## 2018-06-09 ENCOUNTER — Ambulatory Visit (HOSPITAL_COMMUNITY): Payer: Medicare Other | Admitting: Internal Medicine

## 2018-06-10 ENCOUNTER — Other Ambulatory Visit: Payer: Self-pay | Admitting: "Endocrinology

## 2018-06-16 ENCOUNTER — Inpatient Hospital Stay (HOSPITAL_COMMUNITY): Payer: Medicare Other | Attending: Internal Medicine | Admitting: Internal Medicine

## 2018-06-16 ENCOUNTER — Inpatient Hospital Stay (HOSPITAL_COMMUNITY): Payer: Medicare Other

## 2018-06-16 ENCOUNTER — Encounter (HOSPITAL_COMMUNITY): Payer: Self-pay | Admitting: Internal Medicine

## 2018-06-16 ENCOUNTER — Other Ambulatory Visit: Payer: Self-pay

## 2018-06-16 VITALS — BP 146/51 | HR 92 | Temp 98.0°F | Resp 18 | Wt 219.0 lb

## 2018-06-16 DIAGNOSIS — I10 Essential (primary) hypertension: Secondary | ICD-10-CM

## 2018-06-16 DIAGNOSIS — D500 Iron deficiency anemia secondary to blood loss (chronic): Secondary | ICD-10-CM | POA: Insufficient documentation

## 2018-06-16 DIAGNOSIS — Z79899 Other long term (current) drug therapy: Secondary | ICD-10-CM | POA: Diagnosis not present

## 2018-06-16 DIAGNOSIS — E611 Iron deficiency: Secondary | ICD-10-CM

## 2018-06-16 DIAGNOSIS — D696 Thrombocytopenia, unspecified: Secondary | ICD-10-CM | POA: Diagnosis not present

## 2018-06-16 DIAGNOSIS — E039 Hypothyroidism, unspecified: Secondary | ICD-10-CM | POA: Insufficient documentation

## 2018-06-16 DIAGNOSIS — K922 Gastrointestinal hemorrhage, unspecified: Secondary | ICD-10-CM | POA: Diagnosis present

## 2018-06-16 DIAGNOSIS — Z87891 Personal history of nicotine dependence: Secondary | ICD-10-CM | POA: Diagnosis not present

## 2018-06-16 LAB — CBC WITH DIFFERENTIAL/PLATELET
Abs Immature Granulocytes: 0.05 10*3/uL (ref 0.00–0.07)
BASOS ABS: 0 10*3/uL (ref 0.0–0.1)
Basophils Relative: 0 %
EOS ABS: 0.1 10*3/uL (ref 0.0–0.5)
EOS PCT: 1 %
HEMATOCRIT: 32.9 % — AB (ref 36.0–46.0)
HEMOGLOBIN: 9.8 g/dL — AB (ref 12.0–15.0)
Immature Granulocytes: 1 %
LYMPHS ABS: 0.9 10*3/uL (ref 0.7–4.0)
LYMPHS PCT: 17 %
MCH: 25 pg — AB (ref 26.0–34.0)
MCHC: 29.8 g/dL — AB (ref 30.0–36.0)
MCV: 83.9 fL (ref 80.0–100.0)
MONO ABS: 0.5 10*3/uL (ref 0.1–1.0)
MONOS PCT: 9 %
NRBC: 0 % (ref 0.0–0.2)
Neutro Abs: 3.7 10*3/uL (ref 1.7–7.7)
Neutrophils Relative %: 72 %
Platelets: 102 10*3/uL — ABNORMAL LOW (ref 150–400)
RBC: 3.92 MIL/uL (ref 3.87–5.11)
RDW: 14.8 % (ref 11.5–15.5)
WBC: 5.2 10*3/uL (ref 4.0–10.5)

## 2018-06-16 LAB — LACTATE DEHYDROGENASE: LDH: 149 U/L (ref 98–192)

## 2018-06-16 LAB — FERRITIN: Ferritin: 7 ng/mL — ABNORMAL LOW (ref 11–307)

## 2018-06-16 LAB — COMPREHENSIVE METABOLIC PANEL
ALBUMIN: 3.5 g/dL (ref 3.5–5.0)
ALK PHOS: 100 U/L (ref 38–126)
ALT: 20 U/L (ref 0–44)
ANION GAP: 10 (ref 5–15)
AST: 27 U/L (ref 15–41)
BILIRUBIN TOTAL: 0.7 mg/dL (ref 0.3–1.2)
BUN: 22 mg/dL (ref 8–23)
CALCIUM: 9.3 mg/dL (ref 8.9–10.3)
CO2: 29 mmol/L (ref 22–32)
CREATININE: 1.27 mg/dL — AB (ref 0.44–1.00)
Chloride: 96 mmol/L — ABNORMAL LOW (ref 98–111)
GFR calc Af Amer: 50 mL/min — ABNORMAL LOW (ref >60)
GFR calc non Af Amer: 43 mL/min — ABNORMAL LOW (ref >60)
GLUCOSE: 205 mg/dL — AB (ref 70–99)
Potassium: 4.5 mmol/L (ref 3.5–5.1)
SODIUM: 135 mmol/L (ref 135–145)
TOTAL PROTEIN: 7.5 g/dL (ref 6.5–8.1)

## 2018-06-16 NOTE — Progress Notes (Signed)
Diagnosis No diagnosis found.  Staging Cancer Staging No matching staging information was found for the patient.  Assessment and Plan:   1.  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  Hospitalized in 2015 secondary to symptomatic anemia requiring PRBC transfusion. Capsule study with multiple polypoid like lesions throughout small bowel, scattered superficial non-bleeding erosions.  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  She was last treated with IV iron on 09/23/2017.    Labs done 06/16/2018 reviewed and showed WBC 5.2 HB 9.8 plts 102,000.  Chemistries WNL with K+ 4.5 Cr 1.27 and normal LFTs.  Ferritin decreased at 7 which is consistent with IDA.  Pt has an intolerance for IV iron.   She is set up for Injectafer 750 mg IV D1 and D8. Side effects of IV iron reviewed.   She will RTC 08/2018 for follow-up and repeat labs after IV iron.    2.  Thrombocytopenia.  Plt count is 102,000.  Etiology likely due to cirrhosis and splenomegaly which was noted on prior imaging with CT abdomen and pelvis done March 05, 2014.  Will repeat labs in 08/2018.    3.  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  Follow-up with GI as recommended.  CT of abdomen and pelvis done March 05, 2014 that showed evidence of cirrhosis with splenomegaly.  4.  Hypertension.  Blood pressure is 146/51.  Follow-up with PCP.  5.  Hypothyroidism.  Pt on Synthroid.  Follow-up with PCP for monitoring.   Greater than 25 minutes spent with more than 50% spent in counseling and coordination of care.    Current Status: She is seen today for follow-up.  She is here to go over labs.   Problem List Patient Active Problem List   Diagnosis Date Noted  . CKD stage 3 due to type 2 diabetes mellitus ([place]) [V03.50, N18.3] 05/24/2018  . Hyponatremia syndrome [E87.1] 05/24/2018  . Other dysphagia [R13.19] 09/15/2017  . Uncontrolled type 2 diabetes mellitus with  complication ([place]) [K93.8, E11.65] 04/27/2017  . Mixed hyperlipidemia [E78.2] 04/27/2017  . Hyponatremia [E87.1] 04/27/2017  . Chronic diarrhea [K52.9] 12/15/2016  . Carpal tunnel syndrome, left [G56.02]   . Trigger middle finger of left hand [M65.332]   . Thrombocytopenia ([place]) [D69.6] 11/04/2016  . Iron deficiency anemia due to chronic blood loss [D50.0] 01/31/2016  . Other specified hypothyroidism [E03.8] 09/20/2015  . Essential hypertension, benign [I10] 07/12/2015  . Morbid obesity with BMI of 40.0-44.9, adult ([place]) [E66.01, Z68.41] 07/12/2015  . Hepatic cirrhosis ([place]) [K74.60] 04/02/2014  . Acute colitis [K52.9] 03/05/2014  . Abdominal pain, acute, left lower quadrant [R10.32] 03/05/2014  . Colitis [K52.9] 03/05/2014  . Splenomegaly, congestive, chronic [D73.2] 02/17/2014  . Sepsis ([place]) [A41.9] 02/15/2014  . Absolute anemia [D64.9] 02/15/2014  . Diabetes mellitus with stage 3 chronic kidney disease ([place]) [H82.99, N18.3] 02/15/2014  . CHF (congestive heart failure) ([place]) [I50.9] 02/15/2014  . CKD (chronic kidney disease) [N18.9] 02/15/2014  . UTI (lower urinary tract infection) [N39.0] 02/15/2014    Past Medical History Past Medical History:  Diagnosis Date  . Acid reflux   . Anemia   . Arthritis   . C. difficile colitis June/July 2015  . Cancer ([place])    skin cancer  . CHF (congestive heart failure) ([place])   . 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 Negative viral markers 2015  . Complication of anesthesia   . COPD (chronic obstructive pulmonary disease) (  [place])   . Diabetes mellitus without complication ([place])   . GERD (gastroesophageal reflux disease)   . History of kidney stones   . Hypercholesteremia   . Hypertension   . Hypothyroidism   . IBS (irritable bowel syndrome)   . Kidney disease   . Liver disease   . Neuropathy   . PONV (postoperative nausea and vomiting)   . Sleep apnea   . Thyroid disease     Past Surgical History Past Surgical History:   Procedure Laterality Date  . ABDOMINAL HYSTERECTOMY    . APPENDECTOMY    . CAROTID ARTERY - SUBCLAVIAN ARTERY BYPASS GRAFT Right   . CARPAL TUNNEL RELEASE Right   . CARPAL TUNNEL RELEASE Left 12/03/2016   Procedure: CARPAL TUNNEL RELEASE;  Surgeon: Carole Civil, MD;  Location: [facility];  Service: Orthopedics;  Laterality: Left;  . CATARACT EXTRACTION Bilateral   . CHOLECYSTECTOMY    . COLONOSCOPY N/A 02/19/2014   Dr. Gala Romney: rectal varices, rectal polyp overlying a varix non-manipulated  . ESOPHAGOGASTRODUODENOSCOPY N/A 02/19/2014   Dr. Gala Romney: 4 columns of grade 2 esophageal varices, multiple gastric polyps with largest biopsied and hyperplastic. Negative H.pylori  . ESOPHAGOGASTRODUODENOSCOPY N/A 04/13/2018   Procedure: ESOPHAGOGASTRODUODENOSCOPY (EGD);  Surgeon: Daneil Dolin, MD;  Location: [facility] SUITE;  Service: Endoscopy;  Laterality: N/A;  1:00PM-rescheduled to 8/7  . GIVENS CAPSULE STUDY N/A 05/04/2014   multiple polypoid-like lesions throughout small bowel, scattered superficial non-bleeding erosions more distal bowel, referred to [facility] for enteroscopy. Capsule study not complete, poor images.   Marland Kitchen TRIGGER FINGER RELEASE Left 12/03/2016   Procedure: RELEASE TRIGGER FINGER/A-1 PULLEY LEFT LONG TRIGGER FINGER RELEASE;  Surgeon: Carole Civil, MD;  Location: [facility];  Service: Orthopedics;  Laterality: Left;  . urosepsis      Family History Family History  Problem Relation Age of Onset  . Diabetes Mother   . Hypertension Mother   . Kidney failure Mother   . Blindness Mother   . Aneurysm Father   . Colon cancer Neg Hx      Social History  reports that she quit smoking about 15 years ago. She has a 70.00 pack-year smoking history. She has quit using smokeless tobacco. She reports that she does not drink alcohol or use drugs.  Medications  Current Outpatient Medications:  .  amitriptyline (ELAVIL) 25 MG tablet, Take 25 mg by mouth at bedtime., Disp: , Rfl:  .  aspirin  81 MG tablet, Take 81 mg by mouth every morning. , Disp: , Rfl:  .  Azelastine HCl 137 MCG/SPRAY SOLN, Place 1 spray into both nostrils 2 (two) times daily as needed (for congestion). , Disp: , Rfl: 2 .  CRANBERRY PO, Take 1 capsule by mouth at bedtime., Disp: , Rfl:  .  diphenhydrAMINE (SOMINEX) 25 MG tablet, Take 50 mg by mouth at bedtime. , Disp: , Rfl:  .  diphenoxylate-atropine (LOMOTIL) 2.5-0.025 MG tablet, 1-2 tablets every 4-6 hours as needed for diarrhea. (Patient taking differently: Take 1-2 tablets by mouth every 4 (four) hours as needed for diarrhea or loose stools. ), Disp: 120 tablet, Rfl: 0 .  donepezil (ARICEPT) 5 MG tablet, Take 5 mg by mouth at bedtime. , Disp: , Rfl:  .  ezetimibe (ZETIA) 10 MG tablet, Take 10 mg by mouth every morning. , Disp: , Rfl:  .  furosemide (LASIX) 80 MG tablet, Take 80 mg by mouth every morning. , Disp: , Rfl:  .  gabapentin (NEURONTIN) 100 MG  capsule, TAKE (1) CAPSULE BY MOUTH THREE TIMES DAILY, Disp: 90 capsule, Rfl: 2 .  gabapentin (NEURONTIN) 300 MG capsule, , Disp: , Rfl: 2 .  HUMULIN R U-500 KWIKPEN 500 UNIT/ML kwikpen, INJECT 130 UNITS S.Q. THREE TIMES DAILY WITH MEALS. (Patient taking differently: 100 Units 3 (three) times daily with meals. ), Disp: 18 mL, Rfl: 4 .  lansoprazole (PREVACID) 30 MG capsule, Take 30 mg by mouth every morning. , Disp: , Rfl:  .  levothyroxine (SYNTHROID, LEVOTHROID) 100 MCG tablet, TAKE 1 TABLET BY MOUTH ONCE DAILY. (Patient taking differently: Take 100 mcg by mouth daily before breakfast. ), Disp: 90 tablet, Rfl: 0 .  Multiple Vitamins-Minerals (CENTRUM SILVER ADULT 50+ PO), Take 1 tablet by mouth daily., Disp: , Rfl:  .  phenazopyridine (PYRIDIUM) 100 MG tablet, Take 200 mg by mouth 3 (three) times daily as needed for pain. , Disp: , Rfl: 0 .  potassium chloride 20 MEQ TBCR, Take 20 mEq by mouth daily., Disp: 30 tablet, Rfl: 0 .  potassium chloride SA (K-DUR,KLOR-CON) 20 MEQ tablet, , Disp: , Rfl: 0 .  raNITIdine  HCl (ZANTAC PO), Take 1 tablet by mouth at bedtime., Disp: , Rfl:  .  spironolactone (ALDACTONE) 25 MG tablet, , Disp: , Rfl: 6 .  VICTOZA 18 MG/3ML SOPN, INJECT 1.8MG S.Q. ONCE DAILY, Disp: 9 mL, Rfl: 2 .  Vitamin D, Ergocalciferol, (DRISDOL) 50000 units CAPS capsule, Take 50,000 Units by mouth every 30 (thirty) days. , Disp: , Rfl: 1  Allergies Erythromycin; Niacin; Penicillins; Sulfa antibiotics; Ciprofloxacin; Statins; and Codeine  Review of Systems Review of Systems - Oncology ROS negative other than fatigue.   Physical Exam  Vitals Wt Readings from Last 3 Encounters:  06/16/18 219 lb (99.3 kg)  05/10/18 224 lb (101.6 kg)  04/13/18 214 lb (97.1 kg)   Temp Readings from Last 3 Encounters:  06/16/18 98 F (36.7 C) (Oral)  05/25/18 98.1 F (36.7 C) (Oral)  04/13/18 (!) 97.1 F (36.2 C) (Oral)   BP Readings from Last 3 Encounters:  06/16/18 (!) 146/51  05/25/18 (!) 122/40  05/10/18 (!) 144/65   Pulse Readings from Last 3 Encounters:  06/16/18 92  05/25/18 87  05/10/18 98   Constitutional: Well-developed, well-nourished, and in no distress.   HENT: Head: Normocephalic and atraumatic.  Mouth/Throat: No oropharyngeal exudate. Mucosa moist. Eyes: Pupils are equal, round, and reactive to light. Conjunctivae are normal. No scleral icterus.  Neck: Normal range of motion. Neck supple. No JVD present.  Cardiovascular: Normal rate, regular rhythm and normal heart sounds.  Exam reveals no gallop and no friction rub.   No murmur heard. Pulmonary/Chest: Effort normal and breath sounds normal. No respiratory distress. No wheezes.No rales.  Abdominal: Soft. Bowel sounds are normal. No distension. There is no tenderness. There is no guarding.  Musculoskeletal: No edema or tenderness.  Lymphadenopathy: No cervical, axillary or supraclavicular adenopathy.  Neurological: Alert and oriented to person, place, and time. No cranial nerve deficit.  Skin: Skin is warm and dry. No rash  noted. No erythema. No pallor.  Psychiatric: Affect and judgment normal.   Labs Appointment on 06/16/2018  Component Date Value Ref Range Status  . WBC 06/16/2018 5.2  4.0 - 10.5 K/uL Final  . RBC 06/16/2018 3.92  3.87 - 5.11 MIL/uL Final  . Hemoglobin 06/16/2018 9.8* 12.0 - 15.0 g/dL Final  . HCT 06/16/2018 32.9* 36.0 - 46.0 % Final  . MCV 06/16/2018 83.9  80.0 - 100.0 fL Final  .  MCH 06/16/2018 25.0* 26.0 - 34.0 pg Final  . MCHC 06/16/2018 29.8* 30.0 - 36.0 g/dL Final  . RDW 06/16/2018 14.8  11.5 - 15.5 % Final  . Platelets 06/16/2018 102* 150 - 400 K/uL Final   Comment: SPECIMEN CHECKED FOR CLOTS Immature Platelet Fraction may be clinically indicated, consider ordering this additional test [id number] CONSISTENT WITH PREVIOUS RESULT   . nRBC 06/16/2018 0.0  0.0 - 0.2 % Final  . Neutrophils Relative % 06/16/2018 72  % Final  . Neutro Abs 06/16/2018 3.7  1.7 - 7.7 K/uL Final  . Lymphocytes Relative 06/16/2018 17  % Final  . Lymphs Abs 06/16/2018 0.9  0.7 - 4.0 K/uL Final  . Monocytes Relative 06/16/2018 9  % Final  . Monocytes Absolute 06/16/2018 0.5  0.1 - 1.0 K/uL Final  . Eosinophils Relative 06/16/2018 1  % Final  . Eosinophils Absolute 06/16/2018 0.1  0.0 - 0.5 K/uL Final  . Basophils Relative 06/16/2018 0  % Final  . Basophils Absolute 06/16/2018 0.0  0.0 - 0.1 K/uL Final  . Immature Granulocytes 06/16/2018 1  % Final  . Abs Immature Granulocytes 06/16/2018 0.05  0.00 - 0.07 K/uL Final   Performed at [facility], [address] [place], [place] [address]  . Sodium 06/16/2018 135  135 - 145 mmol/L Final  . Potassium 06/16/2018 4.5  3.5 - 5.1 mmol/L Final  . Chloride 06/16/2018 96* 98 - 111 mmol/L Final  . CO2 06/16/2018 29  22 - 32 mmol/L Final  . Glucose, Bld 06/16/2018 205* 70 - 99 mg/dL Final  . BUN 06/16/2018 22  8 - 23 mg/dL Final  . Creatinine, Ser 06/16/2018 1.27* 0.44 - 1.00 mg/dL Final  . Calcium 06/16/2018 9.3  8.9 - 10.3 mg/dL Final  . Total Protein  06/16/2018 7.5  6.5 - 8.1 g/dL Final  . Albumin 06/16/2018 3.5  3.5 - 5.0 g/dL Final  . AST 06/16/2018 27  15 - 41 U/L Final  . ALT 06/16/2018 20  0 - 44 U/L Final  . Alkaline Phosphatase 06/16/2018 100  38 - 126 U/L Final  . Total Bilirubin 06/16/2018 0.7  0.3 - 1.2 mg/dL Final  . GFR calc non Af Amer 06/16/2018 43* >60 mL/min Final  . GFR calc Af Amer 06/16/2018 50* >60 mL/min Final   Comment: (NOTE) The eGFR has been calculated using the CKD EPI equation. This calculation has not been validated in all clinical situations. eGFR's persistently <60 mL/min signify possible Chronic Kidney Disease.   Georgiann Hahn gap 06/16/2018 10  5 - 15 Final   Performed at [facility], [address] [place], [place] [address]  . LDH 06/16/2018 149  98 - 192 U/L Final   Performed at [facility], [address], [place], [place] [address]  . Ferritin 06/16/2018 7* 11 - 307 ng/mL Final   Performed at [facility], [address], [place], [place] [address]     Pathology No orders of the defined types were placed in this encounter.      Zoila Shutter MD

## 2018-06-17 LAB — PROTEIN ELECTROPHORESIS, SERUM
A/G Ratio: 1 (ref 0.7–1.7)
ALBUMIN ELP: 3.4 g/dL (ref 2.9–4.4)
ALPHA-2-GLOBULIN: 0.7 g/dL (ref 0.4–1.0)
Alpha-1-Globulin: 0.3 g/dL (ref 0.0–0.4)
Beta Globulin: 1.2 g/dL (ref 0.7–1.3)
GAMMA GLOBULIN: 1.3 g/dL (ref 0.4–1.8)
Globulin, Total: 3.5 g/dL (ref 2.2–3.9)
Total Protein ELP: 6.9 g/dL (ref 6.0–8.5)

## 2018-06-21 ENCOUNTER — Encounter (HOSPITAL_COMMUNITY): Payer: Self-pay

## 2018-06-21 ENCOUNTER — Other Ambulatory Visit: Payer: Self-pay

## 2018-06-21 ENCOUNTER — Inpatient Hospital Stay (HOSPITAL_COMMUNITY): Payer: Medicare Other

## 2018-06-21 VITALS — BP 159/56 | HR 86 | Temp 98.3°F | Resp 18

## 2018-06-21 DIAGNOSIS — D500 Iron deficiency anemia secondary to blood loss (chronic): Secondary | ICD-10-CM

## 2018-06-21 MED ORDER — SODIUM CHLORIDE 0.9 % IV SOLN
750.0000 mg | Freq: Once | INTRAVENOUS | Status: AC
  Administered 2018-06-21: 750 mg via INTRAVENOUS
  Filled 2018-06-21: qty 15

## 2018-06-21 MED ORDER — SODIUM CHLORIDE 0.9 % IV SOLN
Freq: Once | INTRAVENOUS | Status: AC
  Administered 2018-06-21: 15:00:00 via INTRAVENOUS

## 2018-06-21 NOTE — Progress Notes (Signed)
Pt presents to the [place] ambulatory for Iron infusion. Pt has no complaints at this time. No changes since last visit.   Treatment given today per MD orders. Tolerated infusion without adverse affects. Vital signs stable. No complaints at this time. Discharged from clinic ambulatory. F/U with [facility] as scheduled.

## 2018-06-21 NOTE — Patient Instructions (Signed)
[facility]  Discharge Instructions:   _______________________________________________________________  Thank you for choosing [facility] at [facility] to provide your oncology and hematology care.  To afford each patient quality time with our providers, please arrive at least 15 minutes before your scheduled appointment.  You need to re-schedule your appointment if you arrive 10 or more minutes late.  We strive to give you quality time with our providers, and arriving late affects you and other patients whose appointments are after yours.  Also, if you no show three or more times for appointments you may be dismissed from the clinic.  Again, thank you for choosing [facility] at [facility]. Our hope is that these requests will allow you access to exceptional care and in a timely manner. _______________________________________________________________  If you have questions after your visit, please contact our office at [phone] between the hours of 8:30 a.m. and 5:00 p.m. Voicemails left after 4:30 p.m. will not be returned until the following business day. _______________________________________________________________  For prescription refill requests, have your pharmacy contact our office. _______________________________________________________________  Recommendations made by the consultant and any test results will be sent to your referring physician. _______________________________________________________________ 

## 2018-06-23 ENCOUNTER — Other Ambulatory Visit: Payer: Self-pay | Admitting: "Endocrinology

## 2018-06-28 ENCOUNTER — Ambulatory Visit (HOSPITAL_COMMUNITY): Payer: Medicare Other

## 2018-06-30 ENCOUNTER — Inpatient Hospital Stay (HOSPITAL_COMMUNITY): Payer: Medicare Other

## 2018-06-30 VITALS — BP 125/48 | HR 85 | Temp 98.0°F | Resp 18

## 2018-06-30 DIAGNOSIS — D500 Iron deficiency anemia secondary to blood loss (chronic): Secondary | ICD-10-CM | POA: Diagnosis not present

## 2018-06-30 MED ORDER — SODIUM CHLORIDE 0.9 % IV SOLN
Freq: Once | INTRAVENOUS | Status: AC
  Administered 2018-06-30: 14:00:00 via INTRAVENOUS

## 2018-06-30 MED ORDER — SODIUM CHLORIDE 0.9 % IV SOLN
750.0000 mg | Freq: Once | INTRAVENOUS | Status: AC
  Administered 2018-06-30: 750 mg via INTRAVENOUS
  Filled 2018-06-30: qty 15

## 2018-06-30 NOTE — Patient Instructions (Signed)
[facility] _______________________________________________________________  Thank you for choosing [facility] at [facility] to provide your oncology and hematology care.  To afford each patient quality time with our providers, please arrive at least 15 minutes before your scheduled appointment.  You need to re-schedule your appointment if you arrive 10 or more minutes late.  We strive to give you quality time with our providers, and arriving late affects you and other patients whose appointments are after yours.  Also, if you no show three or more times for appointments you may be dismissed from the clinic.  Again, thank you for choosing [facility]. Our hope is that these requests will allow you access to exceptional care and in a timely manner. _______________________________________________________________  If you have questions after your visit, please contact our office at [phone] between the hours of 8:30 a.m. and 5:00 p.m. Voicemails left after 4:30 p.m. will not be returned until the following business day. _______________________________________________________________  For prescription refill requests, have your pharmacy contact our office. _______________________________________________________________  Recommendations made by the consultant and any test results will be sent to your referring physician. _______________________________________________________________ 

## 2018-06-30 NOTE — Progress Notes (Signed)
Penny Martinez tolerated Injectafer infusion without incident or complaint. VSS upon completion of treatment. Pt observed for 30 minutes post infusion. Discharged self ambulatory in satisfactory condition.

## 2018-07-06 ENCOUNTER — Other Ambulatory Visit: Payer: Self-pay | Admitting: Internal Medicine

## 2018-07-06 ENCOUNTER — Other Ambulatory Visit: Payer: Self-pay | Admitting: "Endocrinology

## 2018-07-07 ENCOUNTER — Telehealth: Payer: Self-pay | Admitting: Internal Medicine

## 2018-07-07 NOTE — Telephone Encounter (Signed)
Letter mailed

## 2018-07-07 NOTE — Telephone Encounter (Signed)
RECALL FOR ULTRASOUND 

## 2018-07-25 ENCOUNTER — Other Ambulatory Visit: Payer: Self-pay | Admitting: *Deleted

## 2018-07-25 ENCOUNTER — Ambulatory Visit (INDEPENDENT_AMBULATORY_CARE_PROVIDER_SITE_OTHER): Payer: Medicare Other | Admitting: Gastroenterology

## 2018-07-25 ENCOUNTER — Encounter: Payer: Self-pay | Admitting: Gastroenterology

## 2018-07-25 ENCOUNTER — Telehealth: Payer: Self-pay | Admitting: *Deleted

## 2018-07-25 ENCOUNTER — Encounter: Payer: Self-pay | Admitting: *Deleted

## 2018-07-25 VITALS — BP 114/64 | HR 87 | Temp 97.0°F | Ht 61.0 in | Wt 218.2 lb

## 2018-07-25 DIAGNOSIS — K529 Noninfective gastroenteritis and colitis, unspecified: Secondary | ICD-10-CM

## 2018-07-25 DIAGNOSIS — R198 Other specified symptoms and signs involving the digestive system and abdomen: Secondary | ICD-10-CM | POA: Diagnosis not present

## 2018-07-25 DIAGNOSIS — K7460 Unspecified cirrhosis of liver: Secondary | ICD-10-CM

## 2018-07-25 MED ORDER — FLUCONAZOLE 150 MG PO TABS: 150.0000 mg | ORAL_TABLET | Freq: Once | ORAL | 0 refills | Status: AC

## 2018-07-25 MED ORDER — DIPHENOXYLATE-ATROPINE 2.5-0.025 MG PO TABS: ORAL_TABLET | ORAL | 1 refills | Status: DC

## 2018-07-25 NOTE — Telephone Encounter (Signed)
Pre-op scheduled for 08/12/18 at 1:15pm. Letter mailed. LMOVM.

## 2018-07-25 NOTE — Progress Notes (Signed)
Referring Provider: [facility]* Primary Care Physician:  Vidal Schwalbe, MD  Primary GI: Dr. Gala Romney   Chief Complaint  Patient presents with  . Cirrhosis  . Abdominal Pain  . Diarrhea    needs Lomotil refill    HPI:   Penny Martinez is a 67 y.o. female presenting today with a history of NASH cirrhosis, chronic diarrhea, IDA followed by Hematology. She has not been able to remain on non-selective beta blocker due to worsening chronic kidney disease. Followed by Nephrology. In interim from last visit, she underwent EGD Aug 2019 with Grade 3 esophageal varices, no esophagitis, portal gastropathy, multiple hyperplastic-appearing polyps. Needs surveillance EGD for banding.   Hospitalized Sept 2019 with acute encephalopathy in setting of hyponatremia due to hypovolemia. Due to hyperkalemia, she is no longer on spironolactone but remains on Lasix. Recent potassium normal. Notes LUQ intermittent. Associated with bloating. States left-sided abdomen is swelling. Diarrhea daily. Some days more than others. Lomotil prn, sometimes daily. Chronic.    Needs EGD with banding  Past Medical History:  Diagnosis Date  . Acid reflux   . Anemia   . Arthritis   . C. difficile colitis June/July 2015  . Cancer ([place])    skin cancer  . CHF (congestive heart failure) ([place])   . Cirrhosis ([place])    likely due to NASH. Negative viral markers 2015  . Complication of anesthesia   . COPD (chronic obstructive pulmonary disease) ([place])   . Diabetes mellitus without complication ([place])   . GERD (gastroesophageal reflux disease)   . History of kidney stones   . Hypercholesteremia   . Hypertension   . Hypothyroidism   . IBS (irritable bowel syndrome)   . Kidney disease   . Liver disease   . Neuropathy   . PONV (postoperative nausea and vomiting)   . Sleep apnea   . Thyroid disease     Past Surgical History:  Procedure Laterality Date  . ABDOMINAL HYSTERECTOMY    . APPENDECTOMY    .  CAROTID ARTERY - SUBCLAVIAN ARTERY BYPASS GRAFT Right   . CARPAL TUNNEL RELEASE Right   . CARPAL TUNNEL RELEASE Left 12/03/2016   Procedure: CARPAL TUNNEL RELEASE;  Surgeon: Carole Civil, MD;  Location: [facility];  Service: Orthopedics;  Laterality: Left;  . CATARACT EXTRACTION Bilateral   . CHOLECYSTECTOMY    . COLONOSCOPY N/A 02/19/2014   Dr. Gala Romney: rectal varices, rectal polyp overlying a varix non-manipulated  . ESOPHAGOGASTRODUODENOSCOPY N/A 02/19/2014   Dr. Gala Romney: 4 columns of grade 2 esophageal varices, multiple gastric polyps with largest biopsied and hyperplastic. Negative H.pylori  . ESOPHAGOGASTRODUODENOSCOPY N/A 04/13/2018   Dr. Gala Romney: Grade 3 esophageal varices, no esophagitis, portal gastropathy, multiple hyperplastic appearing polyps  . GIVENS CAPSULE STUDY N/A 05/04/2014   multiple polypoid-like lesions throughout small bowel, scattered superficial non-bleeding erosions more distal bowel, referred to [facility] for enteroscopy. Capsule study not complete, poor images.   Marland Kitchen TRIGGER FINGER RELEASE Left 12/03/2016   Procedure: RELEASE TRIGGER FINGER/A-1 PULLEY LEFT LONG TRIGGER FINGER RELEASE;  Surgeon: Carole Civil, MD;  Location: [facility];  Service: Orthopedics;  Laterality: Left;  . urosepsis      Current Outpatient Medications  Medication Sig Dispense Refill  . amitriptyline (ELAVIL) 25 MG tablet Take 25 mg by mouth at bedtime.    Marland Kitchen aspirin 81 MG tablet Take 81 mg by mouth every morning.     . Azelastine HCl 137 MCG/SPRAY SOLN Place 1 spray  into both nostrils 2 (two) times daily as needed (for congestion).   2  . CRANBERRY PO Take 1 capsule by mouth at bedtime.    . diphenhydrAMINE (SOMINEX) 25 MG tablet Take 50 mg by mouth at bedtime.     . diphenoxylate-atropine (LOMOTIL) 2.5-0.025 MG tablet 1-2 tablets every 4-6 hours as needed for diarrhea. 120 tablet 1  . donepezil (ARICEPT) 5 MG tablet Take 5 mg by mouth at bedtime.     Marland Kitchen ezetimibe (ZETIA) 10 MG tablet Take 10 mg by  mouth every morning.     . famotidine (PEPCID) 20 MG tablet Take 20 mg by mouth at bedtime.    . furosemide (LASIX) 80 MG tablet Take 80 mg by mouth every morning.     . gabapentin (NEURONTIN) 100 MG capsule TAKE (1) CAPSULE BY MOUTH THREE TIMES DAILY (Patient taking differently: Take 100 mg by mouth at bedtime. ) 90 capsule 2  . HUMULIN R U-500 KWIKPEN 500 UNIT/ML kwikpen INJECT 130 UNITS S.Q. THREE TIMES DAILY WITH MEALS. (Patient taking differently: Inject 100 Units into the skin 3 (three) times daily. ) 18 mL 0  . lansoprazole (PREVACID) 30 MG capsule Take 1 capsule (30 mg total) by mouth 2 (two) times daily before a meal. 60 capsule 11  . levothyroxine (SYNTHROID, LEVOTHROID) 100 MCG tablet TAKE 1 TABLET BY MOUTH ONCE DAILY. 90 tablet 0  . Multiple Vitamins-Minerals (CENTRUM SILVER ADULT 50+ PO) Take 1 tablet by mouth daily.    . phenazopyridine (PYRIDIUM) 100 MG tablet Take 200 mg by mouth 3 (three) times daily as needed for pain.   0  . VICTOZA 18 MG/3ML SOPN INJECT 1.8MG S.Q. ONCE DAILY 9 mL 2  . Vitamin D, Ergocalciferol, (DRISDOL) 50000 units CAPS capsule Take 50,000 Units by mouth every 30 (thirty) days.   1   No current facility-administered medications for this visit.     Allergies as of 07/25/2018 - Review Complete 07/25/2018  Allergen Reaction Noted  . Erythromycin Hives 02/15/2014  . Niacin Rash and Shortness Of Breath 03/05/2014  . Penicillins Shortness Of Breath and Other (See Comments) 02/15/2014  . Sulfa antibiotics Hives 02/15/2014  . Ciprofloxacin Itching and Nausea And Vomiting 05/23/2018  . Statins Other (See Comments) 03/05/2014  . Codeine Itching 11/26/2016    Family History  Problem Relation Age of Onset  . Diabetes Mother   . Hypertension Mother   . Kidney failure Mother   . Blindness Mother   . Aneurysm Father   . Colon cancer Neg Hx     Social History   Socioeconomic History  . Marital status: Married    Spouse name: Not on file  . Number of  children: Not on file  . Years of education: Not on file  . Highest education level: Not on file  Occupational History  . Not on file  Social Needs  . Financial resource strain: Not on file  . Food insecurity:    Worry: Not on file    Inability: Not on file  . Transportation needs:    Medical: Not on file    Non-medical: Not on file  Tobacco Use  . Smoking status: Former Smoker    Packs/day: 2.00    Years: 35.00    Pack years: 70.00    Last attempt to quit: 05/05/2003    Years since quitting: 15.2  . Smokeless tobacco: Former Systems developer  . Tobacco comment: Quit smoking 11 years ago  Substance and Sexual Activity  . Alcohol  use: No  . Drug use: No  . Sexual activity: Not Currently    Birth control/protection: Surgical  Lifestyle  . Physical activity:    Days per week: Not on file    Minutes per session: Not on file  . Stress: Not on file  Relationships  . Social connections:    Talks on phone: Not on file    Gets together: Not on file    Attends religious service: Not on file    Active member of club or organization: Not on file    Attends meetings of clubs or organizations: Not on file    Relationship status: Not on file  Other Topics Concern  . Not on file  Social History Narrative  . Not on file    Review of Systems: Gen: Denies fever, chills, anorexia. Denies fatigue, weakness, weight loss.  CV: Denies chest pain, palpitations, syncope, peripheral edema, and claudication. Resp: Denies dyspnea at rest, cough, wheezing, coughing up blood, and pleurisy. GI: see HPI  Derm: Denies rash, itching, dry skin Psych: Denies depression, anxiety, memory loss, confusion. No homicidal or suicidal ideation.  Heme: Denies bruising, bleeding, and enlarged lymph nodes.  Physical Exam: BP 114/64   Pulse 87   Temp (!) 97 F (36.1 C) (Oral)   Ht 5' 1"  (1.549 m)   Wt 218 lb 3.2 oz (99 kg)   BMI 41.23 kg/m  General:   Alert and oriented. No distress noted. Pleasant and  cooperative.  Head:  Normocephalic and atraumatic. Eyes:  Conjuctiva clear without scleral Lungs: clear to auscultation bilaterally  Cardiac: S1 S2 present with systolic murmur  Abdomen:  +BS, soft, mildly TTP LUQ, obvious bulging LUQ and query hernia. Rectus diastasis appreciate vs ventral hernia Msk:  Symmetrical without gross deformities. Normal posture. Extremities:  Without edema. Neurologic:  Alert and  oriented x4 Psych:  Alert and cooperative. Normal mood and affect.

## 2018-07-25 NOTE — Patient Instructions (Signed)
We have ordered a CT scan to further assess the left-sided fullness and discomfort.  I printed the Lomotil prescription for you, and I sent in [place] to your pharmacy.  We have arranged an upper endoscopy in the near future for you with Dr. Gala Romney!  I will see you in 3-4 months!  Have a wonderful Thanksgiving and Christmas!  I enjoyed seeing you again today! As you know, I value our relationship and want to provide genuine, compassionate, and quality care. I welcome your feedback. If you receive a survey regarding your visit,  I greatly appreciate you taking time to fill this out. See you next time!  Annitta Needs, PhD, ANP-BC [facility]

## 2018-07-26 NOTE — Progress Notes (Signed)
CC'D TO PCP °

## 2018-07-26 NOTE — Assessment & Plan Note (Signed)
Due for surveillance colonoscopy in 2020. Lomotil has worked best historically, which has been refilled.

## 2018-07-26 NOTE — Assessment & Plan Note (Signed)
Very pleasant 67 year old female with history of NASH cirrhosis, due for surveillance EGD now with banding. Last EGD Aug 2019 with Grade 3 varices; she is unable to tolerate non-selective beta blockers due to declining renal status. Followed by Nephrology. Due for imaging as well; she has noted LUQ discomfort and a "bulging", so we have ordered a CT in near future.  Proceed with upper endoscopy and variceal banding  in the near future with Dr. Gala Romney. The risks, benefits, and alternatives have been discussed in detail with patient. They have stated understanding and desire to proceed.  Propofol due to polypharmacy Continue Lasix daily; spironolactone on hold due to hypokalemia Follow-up in 3-4 months

## 2018-07-26 NOTE — Assessment & Plan Note (Signed)
Notable on exam. Query hernia. CT abd/pelvis ordered with oral contrast only due to renal function.

## 2018-07-29 ENCOUNTER — Telehealth: Payer: Self-pay

## 2018-07-29 NOTE — Telephone Encounter (Signed)
PA started for Lomotil through [email or web address]. Waiting on an approval or denial.

## 2018-08-03 LAB — HEMOGLOBIN A1C
HEMOGLOBIN A1C: 7.1 %{Hb} — AB (ref <5.7)
Mean Plasma Glucose: 157 (calc)
eAG (mmol/L): 8.7 (calc)

## 2018-08-03 LAB — COMPLETE METABOLIC PANEL WITH GFR
AG RATIO: 1.2 (calc) (ref 1.0–2.5)
ALBUMIN MSPROF: 3.7 g/dL (ref 3.6–5.1)
ALKALINE PHOSPHATASE (APISO): 141 U/L — AB (ref 33–130)
ALT: 21 U/L (ref 6–29)
AST: 32 U/L (ref 10–35)
BILIRUBIN TOTAL: 0.7 mg/dL (ref 0.2–1.2)
BUN / CREAT RATIO: 17 (calc) (ref 6–22)
BUN: 22 mg/dL (ref 7–25)
CO2: 31 mmol/L (ref 20–32)
CREATININE: 1.31 mg/dL — AB (ref 0.50–0.99)
Calcium: 9 mg/dL (ref 8.6–10.4)
Chloride: 99 mmol/L (ref 98–110)
GFR, EST AFRICAN AMERICAN: 49 mL/min/{1.73_m2} — AB (ref >=60)
GFR, Est Non African American: 42 mL/min/{1.73_m2} — ABNORMAL LOW (ref >=60)
GLOBULIN: 3.2 g/dL (ref 1.9–3.7)
Glucose, Bld: 313 mg/dL — ABNORMAL HIGH (ref 65–99)
Potassium: 4.7 mmol/L (ref 3.5–5.3)
Sodium: 137 mmol/L (ref 135–146)
Total Protein: 6.9 g/dL (ref 6.1–8.1)

## 2018-08-09 NOTE — Telephone Encounter (Signed)
PA for Lansoprazole 30 mg has been approved through [email or web address]. [employer].

## 2018-08-09 NOTE — Patient Instructions (Signed)
ZIAIRE BIESER  08/09/2018     @PREFPERIOPPHARMACY @   Your procedure is scheduled on  08/15/2018 .  Report to [name] at  1215  A.M.  Call this number if you have problems the morning of surgery:  [phone]   Remember:  Follow the diet and prep instructions given to you by Dr Roseanne Kaufman office.                      Take these medicines the morning of surgery with A SIP OF WATER  Benazepril, gabapentin, prevacid, levothyroxine.    Do not wear jewelry, make-up or nail polish.  Do not wear lotions, powders, or perfumes, or deodorant.  Do not shave 48 hours prior to surgery.  Men may shave face and neck.  Do not bring valuables to the hospital.  [facility] is not responsible for any belongings or valuables.  Contacts, dentures or bridgework may not be worn into surgery.  Leave your suitcase in the car.  After surgery it may be brought to your room.  For patients admitted to the hospital, discharge time will be determined by your treatment team.  Patients discharged the day of surgery will not be allowed to drive home.   Name and phone number of your driver:   family Special instructions:  Take 1/2 of your night time insulin dosage the night before your procedure. DO NOT take any medications for diabetes the morning of your procedure.  Please read over the following fact sheets that you were given. Anesthesia Post-op Instructions and Care and Recovery After Surgery       Esophagogastroduodenoscopy Esophagogastroduodenoscopy (EGD) is a procedure to examine the lining of the esophagus, stomach, and first part of the small intestine (duodenum). This procedure is done to check for problems such as inflammation, bleeding, ulcers, or growths. During this procedure, a long, flexible, lighted tube with a camera attached (endoscope) is inserted down the throat. Tell a health care provider about:  Any allergies you have.  All medicines you are taking, including  vitamins, herbs, eye drops, creams, and over-the-counter medicines.  Any problems you or family members have had with anesthetic medicines.  Any blood disorders you have.  Any surgeries you have had.  Any medical conditions you have.  Whether you are pregnant or may be pregnant. What are the risks? Generally, this is a safe procedure. However, problems may occur, including:  Infection.  Bleeding.  A tear (perforation) in the esophagus, stomach, or duodenum.  Trouble breathing.  Excessive sweating.  Spasms of the larynx.  A slowed heartbeat.  Low blood pressure.  What happens before the procedure?  Follow instructions from your health care provider about eating or drinking restrictions.  Ask your health care provider about: ? Changing or stopping your regular medicines. This is especially important if you are taking diabetes medicines or blood thinners. ? Taking medicines such as aspirin and ibuprofen. These medicines can thin your blood. Do not take these medicines before your procedure if your health care provider instructs you not to.  Plan to have someone take you home after the procedure.  If you wear dentures, be ready to remove them before the procedure. What happens during the procedure?  To reduce your risk of infection, your health care team will wash or sanitize their hands.  An IV tube will be put in a vein in your hand or arm. You will get  medicines and fluids through this tube.  You will be given one or more of the following: ? A medicine to help you relax (sedative). ? A medicine to numb the area (local anesthetic). This medicine may be sprayed into your throat. It will make you feel more comfortable and keep you from gagging or coughing during the procedure. ? A medicine for pain.  A mouth guard may be placed in your mouth to protect your teeth and to keep you from biting on the endoscope.  You will be asked to lie on your left side.  The  endoscope will be lowered down your throat into your esophagus, stomach, and duodenum.  Air will be put into the endoscope. This will help your health care provider see better.  The lining of your esophagus, stomach, and duodenum will be examined.  Your health care provider may: ? Take a tissue sample so it can be looked at in a lab (biopsy). ? Remove growths. ? Remove objects (foreign bodies) that are stuck. ? Treat any bleeding with medicines or other devices that stop tissue from bleeding. ? Widen (dilate) or stretch narrowed areas of your esophagus and stomach.  The endoscope will be taken out. The procedure may vary among health care providers and hospitals. What happens after the procedure?  Your blood pressure, heart rate, breathing rate, and blood oxygen level will be monitored often until the medicines you were given have worn off.  Do not eat or drink anything until the numbing medicine has worn off and your gag reflex has returned. This information is not intended to replace advice given to you by your health care provider. Make sure you discuss any questions you have with your health care provider. Document Released: 12/25/2004 Document Revised: 01/30/2016 Document Reviewed: 07/18/2015 Elsevier Interactive Patient Education  2018 [employer]. Esophagogastroduodenoscopy, Care After Refer to this sheet in the next few weeks. These instructions provide you with information about caring for yourself after your procedure. Your health care provider may also give you more specific instructions. Your treatment has been planned according to current medical practices, but problems sometimes occur. Call your health care provider if you have any problems or questions after your procedure. What can I expect after the procedure? After the procedure, it is common to have:  A sore throat.  Nausea.  Bloating.  Dizziness.  Fatigue.  Follow these instructions at home:  Do not eat  or drink anything until the numbing medicine (local anesthetic) has worn off and your gag reflex has returned. You will know that the local anesthetic has worn off when you can swallow comfortably.  Do not drive for 24 hours if you received a medicine to help you relax (sedative).  If your health care provider took a tissue sample for testing during the procedure, make sure to get your test results. This is your responsibility. Ask your health care provider or the department performing the test when your results will be ready.  Keep all follow-up visits as told by your health care provider. This is important. Contact a health care provider if:  You cannot stop coughing.  You are not urinating.  You are urinating less than usual. Get help right away if:  You have trouble swallowing.  You cannot eat or drink.  You have throat or chest pain that gets worse.  You are dizzy or light-headed.  You faint.  You have nausea or vomiting.  You have chills.  You have a fever.  You  have severe abdominal pain.  You have black, tarry, or bloody stools. This information is not intended to replace advice given to you by your health care provider. Make sure you discuss any questions you have with your health care provider. Document Released: 08/10/2012 Document Revised: 01/30/2016 Document Reviewed: 07/18/2015 Elsevier Interactive Patient Education  2018 [employer] Anesthesia is a term that refers to techniques, procedures, and medicines that help a person stay safe and comfortable during a medical procedure. Monitored anesthesia care, or sedation, is one type of anesthesia. Your anesthesia specialist may recommend sedation if you will be having a procedure that does not require you to be unconscious, such as:  Cataract surgery.  A dental procedure.  A biopsy.  A colonoscopy.  During the procedure, you may receive a medicine to help you relax (sedative).  There are three levels of sedation:  Mild sedation. At this level, you may feel awake and relaxed. You will be able to follow directions.  Moderate sedation. At this level, you will be sleepy. You may not remember the procedure.  Deep sedation. At this level, you will be asleep. You will not remember the procedure.  The more medicine you are given, the deeper your level of sedation will be. Depending on how you respond to the procedure, the anesthesia specialist may change your level of sedation or the type of anesthesia to fit your needs. An anesthesia specialist will monitor you closely during the procedure. Let your health care provider know about:  Any allergies you have.  All medicines you are taking, including vitamins, herbs, eye drops, creams, and over-the-counter medicines.  Any use of steroids (by mouth or as a cream).  Any problems you or family members have had with sedatives and anesthetic medicines.  Any blood disorders you have.  Any surgeries you have had.  Any medical conditions you have, such as sleep apnea.  Whether you are pregnant or may be pregnant.  Any use of cigarettes, alcohol, or street drugs. What are the risks? Generally, this is a safe procedure. However, problems may occur, including:  Getting too much medicine (oversedation).  Nausea.  Allergic reaction to medicines.  Trouble breathing. If this happens, a breathing tube may be used to help with breathing. It will be removed when you are awake and breathing on your own.  Heart trouble.  Lung trouble.  Before the procedure Staying hydrated Follow instructions from your health care provider about hydration, which may include:  Up to 2 hours before the procedure - you may continue to drink clear liquids, such as water, clear fruit juice, black coffee, and plain tea.  Eating and drinking restrictions Follow instructions from your health care provider about eating and drinking, which may  include:  8 hours before the procedure - stop eating heavy meals or foods such as meat, fried foods, or fatty foods.  6 hours before the procedure - stop eating light meals or foods, such as toast or cereal.  6 hours before the procedure - stop drinking milk or drinks that contain milk.  2 hours before the procedure - stop drinking clear liquids.  Medicines Ask your health care provider about:  Changing or stopping your regular medicines. This is especially important if you are taking diabetes medicines or blood thinners.  Taking medicines such as aspirin and ibuprofen. These medicines can thin your blood. Do not take these medicines before your procedure if your health care provider instructs you not to.  Tests  and exams  You will have a physical exam.  You may have blood tests done to show: ? How well your kidneys and liver are working. ? How well your blood can clot.  General instructions  Plan to have someone take you home from the hospital or clinic.  If you will be going home right after the procedure, plan to have someone with you for 24 hours.  What happens during the procedure?  Your blood pressure, heart rate, breathing, level of pain and overall condition will be monitored.  An IV tube will be inserted into one of your veins.  Your anesthesia specialist will give you medicines as needed to keep you comfortable during the procedure. This may mean changing the level of sedation.  The procedure will be performed. After the procedure  Your blood pressure, heart rate, breathing rate, and blood oxygen level will be monitored until the medicines you were given have worn off.  Do not drive for 24 hours if you received a sedative.  You may: ? Feel sleepy, clumsy, or nauseous. ? Feel forgetful about what happened after the procedure. ? Have a sore throat if you had a breathing tube during the procedure. ? Vomit. This information is not intended to replace advice  given to you by your health care provider. Make sure you discuss any questions you have with your health care provider. Document Released: 05/20/2005 Document Revised: 01/31/2016 Document Reviewed: 12/15/2015 Elsevier Interactive Patient Education  2018 [employer], Care After These instructions provide you with information about caring for yourself after your procedure. Your health care provider may also give you more specific instructions. Your treatment has been planned according to current medical practices, but problems sometimes occur. Call your health care provider if you have any problems or questions after your procedure. What can I expect after the procedure? After your procedure, it is common to:  Feel sleepy for several hours.  Feel clumsy and have poor balance for several hours.  Feel forgetful about what happened after the procedure.  Have poor judgment for several hours.  Feel nauseous or vomit.  Have a sore throat if you had a breathing tube during the procedure.  Follow these instructions at home: For at least 24 hours after the procedure:   Do not: ? Participate in activities in which you could fall or become injured. ? Drive. ? Use heavy machinery. ? Drink alcohol. ? Take sleeping pills or medicines that cause drowsiness. ? Make important decisions or sign legal documents. ? Take care of children on your own.  Rest. Eating and drinking  Follow the diet that is recommended by your health care provider.  If you vomit, drink water, juice, or soup when you can drink without vomiting.  Make sure you have little or no nausea before eating solid foods. General instructions  Have a responsible adult stay with you until you are awake and alert.  Take over-the-counter and prescription medicines only as told by your health care provider.  If you smoke, do not smoke without supervision.  Keep all follow-up visits as told by your  health care provider. This is important. Contact a health care provider if:  You keep feeling nauseous or you keep vomiting.  You feel light-headed.  You develop a rash.  You have a fever. Get help right away if:  You have trouble breathing. This information is not intended to replace advice given to you by your health care provider. Make sure you  discuss any questions you have with your health care provider. Document Released: 12/15/2015 Document Revised: 04/15/2016 Document Reviewed: 12/15/2015 Elsevier Interactive Patient Education  [employer].

## 2018-08-10 ENCOUNTER — Ambulatory Visit (INDEPENDENT_AMBULATORY_CARE_PROVIDER_SITE_OTHER): Payer: Medicare Other | Admitting: "Endocrinology

## 2018-08-10 ENCOUNTER — Encounter: Payer: Self-pay | Admitting: "Endocrinology

## 2018-08-10 ENCOUNTER — Other Ambulatory Visit: Payer: Self-pay

## 2018-08-10 ENCOUNTER — Encounter (HOSPITAL_COMMUNITY)
Admission: RE | Admit: 2018-08-10 | Discharge: 2018-08-10 | Disposition: A | Discharge status: Home / Self Care | Payer: Medicare Other | Source: Ambulatory Visit | Attending: Internal Medicine | Admitting: Internal Medicine

## 2018-08-10 ENCOUNTER — Encounter (HOSPITAL_COMMUNITY): Payer: Self-pay

## 2018-08-10 VITALS — BP 115/74 | HR 81 | Ht 61.0 in | Wt 218.0 lb

## 2018-08-10 DIAGNOSIS — E782 Mixed hyperlipidemia: Secondary | ICD-10-CM | POA: Diagnosis not present

## 2018-08-10 DIAGNOSIS — Z01812 Encounter for preprocedural laboratory examination: Secondary | ICD-10-CM | POA: Insufficient documentation

## 2018-08-10 DIAGNOSIS — K7460 Unspecified cirrhosis of liver: Secondary | ICD-10-CM | POA: Diagnosis present

## 2018-08-10 DIAGNOSIS — I10 Essential (primary) hypertension: Secondary | ICD-10-CM

## 2018-08-10 DIAGNOSIS — N183 Chronic kidney disease, stage 3 unspecified: Secondary | ICD-10-CM

## 2018-08-10 DIAGNOSIS — E1122 Type 2 diabetes mellitus with diabetic chronic kidney disease: Secondary | ICD-10-CM

## 2018-08-10 DIAGNOSIS — E038 Other specified hypothyroidism: Secondary | ICD-10-CM

## 2018-08-10 DIAGNOSIS — R198 Other specified symptoms and signs involving the digestive system and abdomen: Secondary | ICD-10-CM | POA: Diagnosis present

## 2018-08-10 LAB — CBC WITH DIFFERENTIAL/PLATELET
Abs Immature Granulocytes: 0.03 10*3/uL (ref 0.00–0.07)
BASOS PCT: 0 %
Basophils Absolute: 0 10*3/uL (ref 0.0–0.1)
Eosinophils Absolute: 0.1 10*3/uL (ref 0.0–0.5)
Eosinophils Relative: 2 %
HCT: 44.8 % (ref 36.0–46.0)
Hemoglobin: 13.8 g/dL (ref 12.0–15.0)
Immature Granulocytes: 1 %
Lymphocytes Relative: 22 %
Lymphs Abs: 1.2 10*3/uL (ref 0.7–4.0)
MCH: 28.5 pg (ref 26.0–34.0)
MCHC: 30.8 g/dL (ref 30.0–36.0)
MCV: 92.6 fL (ref 80.0–100.0)
MONO ABS: 0.4 10*3/uL (ref 0.1–1.0)
Monocytes Relative: 8 %
Neutro Abs: 3.7 10*3/uL (ref 1.7–7.7)
Neutrophils Relative %: 67 %
Platelets: 76 10*3/uL — ABNORMAL LOW (ref 150–400)
RBC: 4.84 MIL/uL (ref 3.87–5.11)
RDW: 21.5 % — ABNORMAL HIGH (ref 11.5–15.5)
WBC: 5.5 10*3/uL (ref 4.0–10.5)
nRBC: 0 % (ref 0.0–0.2)

## 2018-08-10 LAB — BASIC METABOLIC PANEL
Anion gap: 10 (ref 5–15)
BUN: 19 mg/dL (ref 8–23)
CO2: 26 mmol/L (ref 22–32)
Calcium: 9 mg/dL (ref 8.9–10.3)
Chloride: 103 mmol/L (ref 98–111)
Creatinine, Ser: 1.1 mg/dL — ABNORMAL HIGH (ref 0.44–1.00)
GFR calc Af Amer: >60 mL/min (ref >60)
GFR calc non Af Amer: 52 mL/min — ABNORMAL LOW (ref >60)
Glucose, Bld: 127 mg/dL — ABNORMAL HIGH (ref 70–99)
Potassium: 3.9 mmol/L (ref 3.5–5.1)
Sodium: 139 mmol/L (ref 135–145)

## 2018-08-10 LAB — PROTIME-INR
INR: 0.94
Prothrombin Time: 12.5 seconds (ref 11.4–15.2)

## 2018-08-10 NOTE — Patient Instructions (Signed)

## 2018-08-10 NOTE — Progress Notes (Signed)
Endocrinology follow-up note  Subjective:    Patient ID: Penny Martinez, female    DOB: [birth year],    Past Medical History:  Diagnosis Date  . Acid reflux   . Anemia   . Arthritis   . C. difficile colitis June/July 2015  . Cancer ([place])    skin cancer  . CHF (congestive heart failure) ([place])   . 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 Negative viral markers 2015  . Complication of anesthesia   . COPD (chronic obstructive pulmonary disease) ([place])   . Diabetes mellitus without complication ([place])   . GERD (gastroesophageal reflux disease)   . History of kidney stones   . Hypercholesteremia   . Hypertension   . Hypothyroidism   . IBS (irritable bowel syndrome)   . Kidney disease   . Liver disease   . Neuropathy   . PONV (postoperative nausea and vomiting)   . Sleep apnea   . Thyroid disease    Past Surgical History:  Procedure Laterality Date  . ABDOMINAL HYSTERECTOMY    . APPENDECTOMY    . CAROTID ARTERY - SUBCLAVIAN ARTERY BYPASS GRAFT Right   . CARPAL TUNNEL RELEASE Right   . CARPAL TUNNEL RELEASE Left 12/03/2016   Procedure: CARPAL TUNNEL RELEASE;  Surgeon: Carole Civil, MD;  Location: [facility];  Service: Orthopedics;  Laterality: Left;  . CATARACT EXTRACTION Bilateral   . CHOLECYSTECTOMY    . COLONOSCOPY N/A 02/19/2014   Dr. Gala Romney: rectal varices, rectal polyp overlying a varix non-manipulated  . ESOPHAGOGASTRODUODENOSCOPY N/A 02/19/2014   Dr. Gala Romney: 4 columns of grade 2 esophageal varices, multiple gastric polyps with largest biopsied and hyperplastic. Negative H.pylori  . ESOPHAGOGASTRODUODENOSCOPY N/A 04/13/2018   Dr. Gala Romney: Grade 3 esophageal varices, no esophagitis, portal gastropathy, multiple hyperplastic appearing polyps  . GIVENS CAPSULE STUDY N/A 05/04/2014   multiple polypoid-like lesions throughout small bowel, scattered superficial non-bleeding erosions more distal bowel, referred to [facility] for enteroscopy. Capsule study not complete, poor images.    Marland Kitchen TRIGGER FINGER RELEASE Left 12/03/2016   Procedure: RELEASE TRIGGER FINGER/A-1 PULLEY LEFT LONG TRIGGER FINGER RELEASE;  Surgeon: Carole Civil, MD;  Location: [facility];  Service: Orthopedics;  Laterality: Left;  . urosepsis     Social History   Socioeconomic History  . Marital status: Married    Spouse name: Not on file  . Number of children: Not on file  . Years of education: Not on file  . Highest education level: Not on file  Occupational History  . Not on file  Social Needs  . Financial resource strain: Not on file  . Food insecurity:    Worry: Not on file    Inability: Not on file  . Transportation needs:    Medical: Not on file    Non-medical: Not on file  Tobacco Use  . Smoking status: Former Smoker    Packs/day: 2.00    Years: 35.00    Pack years: 70.00    Last attempt to quit: 05/05/2003    Years since quitting: 15.2  . Smokeless tobacco: Never Used  . Tobacco comment: Quit smoking 11 years ago  Substance and Sexual Activity  . Alcohol use: No  . Drug use: No  . Sexual activity: Not Currently    Birth control/protection: Surgical  Lifestyle  . Physical activity:    Days per week: Not on file    Minutes per session: Not on file  . Stress: Not on file  Relationships  .  Social connections:    Talks on phone: Not on file    Gets together: Not on file    Attends religious service: Not on file    Active member of club or organization: Not on file    Attends meetings of clubs or organizations: Not on file    Relationship status: Not on file  Other Topics Concern  . Not on file  Social History Narrative  . Not on file   Outpatient Encounter Medications as of 08/10/2018  Medication Sig  . amitriptyline (ELAVIL) 25 MG tablet Take 25 mg by mouth at bedtime.  Marland Kitchen aspirin 81 MG tablet Take 81 mg by mouth every morning.   . Azelastine HCl 137 MCG/SPRAY SOLN Place 1 spray into both nostrils daily.   . benazepril (LOTENSIN) 20 MG tablet Take 20 mg by mouth  daily.  Marland Kitchen CRANBERRY PO Take 15,000 mg by mouth at bedtime.   . diphenhydrAMINE (SOMINEX) 25 MG tablet Take 50 mg by mouth at bedtime.   . diphenoxylate-atropine (LOMOTIL) 2.5-0.025 MG tablet 1-2 tablets every 4-6 hours as needed for diarrhea. (Patient taking differently: Take 1-2 tablets by mouth 4 (four) times daily as needed for diarrhea or loose stools. 1-2 tablets every 4-6 hours as needed for diarrhea.)  . donepezil (ARICEPT) 5 MG tablet Take 5 mg by mouth at bedtime.   Marland Kitchen ezetimibe (ZETIA) 10 MG tablet Take 10 mg by mouth every morning.   . famotidine (PEPCID) 10 MG tablet Take 10 mg by mouth at bedtime.   . furosemide (LASIX) 80 MG tablet Take 80 mg by mouth every morning.   . gabapentin (NEURONTIN) 100 MG capsule TAKE (1) CAPSULE BY MOUTH THREE TIMES DAILY (Patient not taking: No sig reported)  . gabapentin (NEURONTIN) 300 MG capsule Take 300 mg by mouth at bedtime.  Marland Kitchen HUMULIN R U-500 KWIKPEN 500 UNIT/ML kwikpen INJECT 130 UNITS S.Q. THREE TIMES DAILY WITH MEALS. (Patient taking differently: Inject 100 Units into the skin 3 (three) times daily. )  . lansoprazole (PREVACID) 30 MG capsule Take 1 capsule (30 mg total) by mouth 2 (two) times daily before a meal. (Patient taking differently: Take 30 mg by mouth daily. )  . levothyroxine (SYNTHROID, LEVOTHROID) 100 MCG tablet TAKE 1 TABLET BY MOUTH ONCE DAILY. (Patient taking differently: Take 100 mcg by mouth daily before breakfast. )  . Multiple Vitamins-Minerals (CENTRUM SILVER ADULT 50+ PO) Take 1 tablet by mouth daily.  Marland Kitchen VICTOZA 18 MG/3ML SOPN INJECT 1.8MG S.Q. ONCE DAILY (Patient taking differently: Inject 1.8 mg into the skin at bedtime. )  . Vitamin D, Ergocalciferol, (DRISDOL) 50000 units CAPS capsule Take 50,000 Units by mouth every 7 (seven) days.    No facility-administered encounter medications on file as of 08/10/2018.    ALLERGIES: Allergies  Allergen Reactions  . Erythromycin Hives  . Niacin Rash and Shortness Of Breath  .  Penicillins Shortness Of Breath and Other (See Comments)    Has patient had a PCN reaction causing immediate rash, facial/tongue/throat swelling, SOB or lightheadedness with hypotension: Yes Has patient had a PCN reaction causing severe rash involving mucus membranes or skin necrosis: No Has patient had a PCN reaction that required hospitalization: No Has patient had a PCN reaction occurring within the last 10 years: No If all of the above answers are "NO", then may proceed with Cephalosporin use.   . Sulfa Antibiotics Hives  . Ciprofloxacin Itching and Nausea And Vomiting    Stomach pain  . Statins Other (See  Comments)    Muscle Pain  . Codeine Itching   VACCINATION STATUS: Immunization History  Administered Date(s) Administered  . Influenza, High Dose Seasonal PF 05/24/2018  . Influenza,inj,Quad PF,6+ Mos 07/24/2016, 06/24/2017  . Pneumococcal Polysaccharide-23 05/25/2018    Diabetes  She presents for her follow-up diabetic visit. She has type 2 diabetes mellitus. Onset time: She was diagnosed at approximate age of 89 years. Her disease course has been stable. There are no hypoglycemic associated symptoms. Pertinent negatives for hypoglycemia include no confusion, headaches, pallor or seizures. Associated symptoms include fatigue. Pertinent negatives for diabetes include no chest pain, no polydipsia, no polyphagia and no polyuria. There are no hypoglycemic complications. Symptoms are stable. Diabetic complications include heart disease, nephropathy and PVD. Risk factors for coronary artery disease include diabetes mellitus, dyslipidemia, obesity, hypertension, sedentary lifestyle and tobacco exposure. Current diabetic treatment includes intensive insulin program. She is compliant with treatment most of the time. Her weight is fluctuating minimally. She is following a generally unhealthy diet. She never participates in exercise. Her home blood glucose trend is decreasing steadily. Her  breakfast blood glucose range is generally 140-180 mg/dl. Her lunch blood glucose range is generally 140-180 mg/dl. Her dinner blood glucose range is generally 140-180 mg/dl. Her bedtime blood glucose range is generally 140-180 mg/dl. Her overall blood glucose range is 140-180 mg/dl. An ACE inhibitor/angiotensin II receptor blocker is being taken. Eye exam is current.  Hypertension  This is a chronic problem. The current episode started more than 1 year ago. The problem is controlled. Pertinent negatives include no chest pain, headaches, palpitations or shortness of breath. Risk factors for coronary artery disease include diabetes mellitus, dyslipidemia, obesity, sedentary lifestyle and post-menopausal state. Hypertensive end-organ damage includes PVD.  Hyperlipidemia  This is a chronic problem. The current episode started more than 1 year ago. Exacerbating diseases include diabetes and obesity. Pertinent negatives include no chest pain, myalgias or shortness of breath. Risk factors for coronary artery disease include diabetes mellitus, dyslipidemia, hypertension, a sedentary lifestyle and obesity.    Review of Systems  Constitutional: Positive for fatigue. Negative for unexpected weight change.  HENT: Negative for trouble swallowing and voice change.   Eyes: Negative for visual disturbance.  Respiratory: Negative for cough, shortness of breath and wheezing.   Cardiovascular: Negative for chest pain, palpitations and leg swelling.  Gastrointestinal: Negative for diarrhea, nausea and vomiting.  Endocrine: Negative for cold intolerance, heat intolerance, polydipsia, polyphagia and polyuria.  Musculoskeletal: Negative for arthralgias and myalgias.  Skin: Negative for color change, pallor, rash and wound.  Neurological: Negative for seizures and headaches.  Psychiatric/Behavioral: Negative for confusion and suicidal ideas.    Objective:    BP 115/74   Pulse 81   Ht 5' 1"  (1.549 m)   Wt 218 lb  (98.9 kg)   BMI 41.19 kg/m   Wt Readings from Last 3 Encounters:  08/10/18 218 lb (98.9 kg)  08/10/18 218 lb 3.2 oz (99 kg)  07/25/18 218 lb 3.2 oz (99 kg)    Physical Exam  Constitutional: She is oriented to person, place, and time. She appears well-developed.  HENT:  Head: Normocephalic and atraumatic.  Eyes: EOM are normal.  Neck: Normal range of motion. Neck supple. No tracheal deviation present. No thyromegaly present.  Cardiovascular: Normal rate and regular rhythm.  Pulmonary/Chest: Effort normal and breath sounds normal.  Abdominal: Soft. Bowel sounds are normal. There is no tenderness. There is no guarding.  Musculoskeletal: Normal range of motion. She exhibits no edema.  Neurological: She is alert and oriented to person, place, and time. No cranial nerve deficit. Coordination normal.  Skin: Skin is warm and dry. No rash noted. No erythema. No pallor.  Psychiatric: She has a normal mood and affect. Judgment normal.  With significant cognitive deficit.    Results for orders placed or performed in visit on 06/16/18  CBC with Differential/Platelet  Result Value Ref Range   WBC 5.2 4.0 - 10.5 K/uL   RBC 3.92 3.87 - 5.11 MIL/uL   Hemoglobin 9.8 (L) 12.0 - 15.0 g/dL   HCT 32.9 (L) 36.0 - 46.0 %   MCV 83.9 80.0 - 100.0 fL   MCH 25.0 (L) 26.0 - 34.0 pg   MCHC 29.8 (L) 30.0 - 36.0 g/dL   RDW 14.8 11.5 - 15.5 %   Platelets 102 (L) 150 - 400 K/uL   nRBC 0.0 0.0 - 0.2 %   Neutrophils Relative % 72 %   Neutro Abs 3.7 1.7 - 7.7 K/uL   Lymphocytes Relative 17 %   Lymphs Abs 0.9 0.7 - 4.0 K/uL   Monocytes Relative 9 %   Monocytes Absolute 0.5 0.1 - 1.0 K/uL   Eosinophils Relative 1 %   Eosinophils Absolute 0.1 0.0 - 0.5 K/uL   Basophils Relative 0 %   Basophils Absolute 0.0 0.0 - 0.1 K/uL   Immature Granulocytes 1 %   Abs Immature Granulocytes 0.05 0.00 - 0.07 K/uL  Comprehensive metabolic panel  Result Value Ref Range   Sodium 135 135 - 145 mmol/L   Potassium 4.5 3.5 -  5.1 mmol/L   Chloride 96 (L) 98 - 111 mmol/L   CO2 29 22 - 32 mmol/L   Glucose, Bld 205 (H) 70 - 99 mg/dL   BUN 22 8 - 23 mg/dL   Creatinine, Ser 1.27 (H) 0.44 - 1.00 mg/dL   Calcium 9.3 8.9 - 10.3 mg/dL   Total Protein 7.5 6.5 - 8.1 g/dL   Albumin 3.5 3.5 - 5.0 g/dL   AST 27 15 - 41 U/L   ALT 20 0 - 44 U/L   Alkaline Phosphatase 100 38 - 126 U/L   Total Bilirubin 0.7 0.3 - 1.2 mg/dL   GFR calc non Af Amer 43 (L) >60 mL/min   GFR calc Af Amer 50 (L) >60 mL/min   Anion gap 10 5 - 15  Lactate dehydrogenase  Result Value Ref Range   LDH 149 98 - 192 U/L  Protein electrophoresis, serum  Result Value Ref Range   Total Protein ELP 6.9 6.0 - 8.5 g/dL   Albumin ELP 3.4 2.9 - 4.4 g/dL   Alpha-1-Globulin 0.3 0.0 - 0.4 g/dL   Alpha-2-Globulin 0.7 0.4 - 1.0 g/dL   Beta Globulin 1.2 0.7 - 1.3 g/dL   Gamma Globulin 1.3 0.4 - 1.8 g/dL   M-Spike, % Not Observed Not Observed g/dL   SPE Interp. Comment    Comment Comment    GLOBULIN, TOTAL 3.5 2.2 - 3.9 g/dL   A/G Ratio 1.0 0.7 - 1.7  Ferritin  Result Value Ref Range   Ferritin 7 (L) 11 - 307 ng/mL   Complete Blood Count (Most recent): Lab Results  Component Value Date   WBC 5.2 06/16/2018   HGB 9.8 (L) 06/16/2018   HCT 32.9 (L) 06/16/2018   MCV 83.9 06/16/2018   PLT 102 (L) 06/16/2018    Diabetic Labs (most recent): Lab Results  Component Value Date   HGBA1C 7.1 (H) 08/02/2018   HGBA1C 7.1 (  H) 04/18/2018   HGBA1C 8.0 (H) 01/19/2018      Assessment & Plan:   1. Uncontrolled type 2 diabetes mellitus with complication, with long-term current use of insulin ([place])  Her diabetes is  complicated by chronic kidney disease and peripheral arterial disease as well as CHF. -She continued to respond to insulin U500. -She returns with better glycemic profile and A1c of 7.1%.  He has no documented or reported hypoglycemia.  - She remains at a high risk for more acute and chronic complications of diabetes which include CAD, CVA, CKD,  retinopathy, and neuropathy. These are all discussed in detail with the patient.    Glucose logs and insulin administration records pertaining to this visit,  to be scanned into patient's records.  Recent labs reviewed.   - I have re-counseled the patient on diet management and weight loss  by adopting a carbohydrate restricted / protein rich  Diet.  -  Suggestion is made for her to avoid simple carbohydrates  from her diet including Cakes, Sweet Desserts / Pastries, Ice Cream, Soda (diet and regular), Sweet Tea, Candies, Chips, Cookies, Store Bought Juices, Alcohol in Excess of  1-2 drinks a day, Artificial Sweeteners, and "Sugar-free" Products. This will help patient to have stable blood glucose profile and potentially avoid unintended weight gain.  - Patient is advised to stick to a routine mealtimes to eat 3 meals  a day and avoid unnecessary snacks ( to snack only to correct hypoglycemia).  - I have approached patient with the following individualized plan to manage diabetes and patient agrees.  -She is  at risk of both hypoglycemia and hyperglycemia due to possible cognitive decline.   -She is advised to continue U5003 100 units 3 times daily AC only for pre-meal blood glucose readings greater than 90 mg/dL, associated with strict monitoring of blood glucose 4 times a day-before meals and at bedtime.   She is advised to wear her CGM device at all times. -She advised to continue Victoza 1.8 mg.  -Patient is encouraged to call clinic for blood glucose levels less than 70 or above 300 mg /dl. -She declined referral to the dietitian.  -Patient is not a candidate for metformin andSGLT2 inhibitors due to CKD.  - Patient specific target  for A1c; LDL, HDL, Triglycerides, and  Waist Circumference were discussed in detail.  2) BP/HTN: Her blood pressure is controlled to target. She is advised to continue her current blood pressure medications including benazepril 20 mg p.o. daily, Lasix as  needed, spironolactone 25 mg p.o. Daily.  3) Lipids/HPL: She does not tolerate statins.  She is on Zetia 10 mg p.o. Daily.  She will be considered for fasting lipid panel on subsequent visits.     4)  Weight/Diet:  exercise, and carbohydrates information provided.  4) Primary hypothyroidism  -Her thyroid function tests are consistent with appropriate replacement.   -She is advised to continue levothyroxine 100 mcg p.o. every morning.  - We discussed about correct intake of levothyroxine, at fasting, with water, separated by at least 30 minutes from breakfast, and separated by more than 4 hours from calcium, iron, multivitamins, acid reflux medications (PPIs). -Patient is made aware of the fact that thyroid hormone replacement is needed for life, dose to be adjusted by periodic monitoring of thyroid function tests.  6) Chronic Care/Health Maintenance:  -Patient is  on ACEI/ARB and Statin medications and encouraged to continue to follow up with Ophthalmology, Podiatrist at least yearly or according to recommendations,  and advised to  stay away from smoking. I have recommended yearly flu vaccine and pneumonia vaccination at least every 5 years; and  sleep for at least 7 hours a day. - She cannot exercise at optimum level. I advised patient to maintain close follow up with her PCP for primary care needs.  - Time spent with the patient: 25 min, of which >50% was spent in reviewing her blood glucose logs , discussing her hypo- and hyper-glycemic episodes, reviewing her current and  previous labs and insulin doses and developing a plan to avoid hypo- and hyper-glycemia. Please refer to Patient Instructions for Blood Glucose Monitoring and Insulin/Medications Dosing Guide"  in media tab for additional information. Penny Martinez participated in the discussions, expressed understanding, and voiced agreement with the above plans.  All questions were answered to her satisfaction. she is encouraged to  contact clinic should she have any questions or concerns prior to her return visit.   Follow up plan: Return in about 4 months (around 12/10/2018) for Follow up with Pre-visit Labs, Meter, and Logs.  Glade Lloyd, MD Phone: [phone]  Fax: [phone]  This note was partially dictated with voice recognition software. Similar sounding words can be transcribed inadequately or may not  be corrected upon review.  08/10/2018, 1:48 PM

## 2018-08-11 ENCOUNTER — Telehealth: Payer: Self-pay | Admitting: Internal Medicine

## 2018-08-11 ENCOUNTER — Ambulatory Visit (HOSPITAL_COMMUNITY)
Admission: RE | Admit: 2018-08-11 | Discharge: 2018-08-11 | Disposition: A | Discharge status: Home / Self Care | Payer: Medicare Other | Source: Ambulatory Visit | Attending: Gastroenterology | Admitting: Gastroenterology

## 2018-08-11 DIAGNOSIS — R198 Other specified symptoms and signs involving the digestive system and abdomen: Secondary | ICD-10-CM

## 2018-08-11 DIAGNOSIS — K7460 Unspecified cirrhosis of liver: Secondary | ICD-10-CM

## 2018-08-11 NOTE — Telephone Encounter (Signed)
I went over instructions with patient regarding her EGD on Monday. She has questions about her medication. Please call her at [phone]

## 2018-08-11 NOTE — Telephone Encounter (Signed)
Pt was returning a call to [place]. Please call her back at [phone]

## 2018-08-11 NOTE — Telephone Encounter (Signed)
Spoke with patient and made aware AB did not state to stop any medications. She voiced understanding

## 2018-08-11 NOTE — Telephone Encounter (Signed)
[email or web address]  

## 2018-08-12 ENCOUNTER — Telehealth: Payer: Self-pay

## 2018-08-12 ENCOUNTER — Other Ambulatory Visit (HOSPITAL_COMMUNITY): Payer: Medicare Other

## 2018-08-12 NOTE — Telephone Encounter (Signed)
CT findings/results are in pts chart. Received a call from the radiology department.

## 2018-08-12 NOTE — Progress Notes (Signed)
She has a ventral hernia but doesn't explain left-sided abdominal pain. There is noted to be focal wall thickening in mid transverse colon but non-specific. Could be underdistended, unable to exclude a colonic lesion. I would recommend a colonoscopy, but she is already undergoing EGD on 08/15/18. Could do both at same time, but this may be scheduled farther out. She will also need a non-contrast CT in 12 months due to nodular structures at right lung base due to history of smoking in remote past.

## 2018-08-12 NOTE — H&P (View-Only) (Signed)
She has a ventral hernia but doesn't explain left-sided abdominal pain. There is noted to be focal wall thickening in mid transverse colon but non-specific. Could be underdistended, unable to exclude a colonic lesion. I would recommend a colonoscopy, but she is already undergoing EGD on 08/15/18. Could do both at same time, but this may be scheduled farther out. She will also need a non-contrast CT in 12 months due to nodular structures at right lung base due to history of smoking in remote past.

## 2018-08-15 ENCOUNTER — Ambulatory Visit (HOSPITAL_COMMUNITY): Payer: Medicare Other | Admitting: Anesthesiology

## 2018-08-15 ENCOUNTER — Ambulatory Visit (HOSPITAL_COMMUNITY)
Admission: RE | Admit: 2018-08-15 | Discharge: 2018-08-15 | Disposition: A | Discharge status: Home / Self Care | Payer: Medicare Other | Source: Ambulatory Visit | Attending: Internal Medicine | Admitting: Internal Medicine

## 2018-08-15 ENCOUNTER — Telehealth: Payer: Self-pay

## 2018-08-15 ENCOUNTER — Encounter (HOSPITAL_COMMUNITY): Payer: Self-pay

## 2018-08-15 ENCOUNTER — Encounter (HOSPITAL_COMMUNITY)
Admission: RE | Disposition: A | Discharge status: Home / Self Care | Payer: Self-pay | Source: Ambulatory Visit | Attending: Internal Medicine

## 2018-08-15 DIAGNOSIS — K589 Irritable bowel syndrome without diarrhea: Secondary | ICD-10-CM | POA: Diagnosis not present

## 2018-08-15 DIAGNOSIS — Z881 Allergy status to other antibiotic agents status: Secondary | ICD-10-CM | POA: Diagnosis not present

## 2018-08-15 DIAGNOSIS — Z794 Long term (current) use of insulin: Secondary | ICD-10-CM | POA: Diagnosis not present

## 2018-08-15 DIAGNOSIS — E039 Hypothyroidism, unspecified: Secondary | ICD-10-CM | POA: Diagnosis not present

## 2018-08-15 DIAGNOSIS — Z880 Allergy status to penicillin: Secondary | ICD-10-CM | POA: Insufficient documentation

## 2018-08-15 DIAGNOSIS — Z882 Allergy status to sulfonamides status: Secondary | ICD-10-CM | POA: Insufficient documentation

## 2018-08-15 DIAGNOSIS — G4730 Sleep apnea, unspecified: Secondary | ICD-10-CM | POA: Insufficient documentation

## 2018-08-15 DIAGNOSIS — Z888 Allergy status to other drugs, medicaments and biological substances status: Secondary | ICD-10-CM | POA: Insufficient documentation

## 2018-08-15 DIAGNOSIS — Z7982 Long term (current) use of aspirin: Secondary | ICD-10-CM | POA: Diagnosis not present

## 2018-08-15 DIAGNOSIS — Z79890 Hormone replacement therapy (postmenopausal): Secondary | ICD-10-CM | POA: Diagnosis not present

## 2018-08-15 DIAGNOSIS — K766 Portal hypertension: Secondary | ICD-10-CM | POA: Diagnosis not present

## 2018-08-15 DIAGNOSIS — K7460 Unspecified cirrhosis of liver: Secondary | ICD-10-CM | POA: Insufficient documentation

## 2018-08-15 DIAGNOSIS — E1140 Type 2 diabetes mellitus with diabetic neuropathy, unspecified: Secondary | ICD-10-CM | POA: Insufficient documentation

## 2018-08-15 DIAGNOSIS — N189 Chronic kidney disease, unspecified: Secondary | ICD-10-CM | POA: Diagnosis not present

## 2018-08-15 DIAGNOSIS — Z87891 Personal history of nicotine dependence: Secondary | ICD-10-CM | POA: Diagnosis not present

## 2018-08-15 DIAGNOSIS — Z6841 Body Mass Index (BMI) 40.0 and over, adult: Secondary | ICD-10-CM | POA: Insufficient documentation

## 2018-08-15 DIAGNOSIS — I8511 Secondary esophageal varices with bleeding: Secondary | ICD-10-CM | POA: Diagnosis not present

## 2018-08-15 DIAGNOSIS — K219 Gastro-esophageal reflux disease without esophagitis: Secondary | ICD-10-CM | POA: Insufficient documentation

## 2018-08-15 DIAGNOSIS — I509 Heart failure, unspecified: Secondary | ICD-10-CM | POA: Diagnosis not present

## 2018-08-15 DIAGNOSIS — J449 Chronic obstructive pulmonary disease, unspecified: Secondary | ICD-10-CM | POA: Insufficient documentation

## 2018-08-15 DIAGNOSIS — I130 Hypertensive heart and chronic kidney disease with heart failure and stage 1 through stage 4 chronic kidney disease, or unspecified chronic kidney disease: Secondary | ICD-10-CM | POA: Diagnosis not present

## 2018-08-15 DIAGNOSIS — K3189 Other diseases of stomach and duodenum: Secondary | ICD-10-CM | POA: Diagnosis not present

## 2018-08-15 DIAGNOSIS — E785 Hyperlipidemia, unspecified: Secondary | ICD-10-CM | POA: Diagnosis not present

## 2018-08-15 DIAGNOSIS — M1990 Unspecified osteoarthritis, unspecified site: Secondary | ICD-10-CM | POA: Diagnosis not present

## 2018-08-15 DIAGNOSIS — Z885 Allergy status to narcotic agent status: Secondary | ICD-10-CM | POA: Insufficient documentation

## 2018-08-15 DIAGNOSIS — E1122 Type 2 diabetes mellitus with diabetic chronic kidney disease: Secondary | ICD-10-CM | POA: Insufficient documentation

## 2018-08-15 DIAGNOSIS — Z79899 Other long term (current) drug therapy: Secondary | ICD-10-CM | POA: Diagnosis not present

## 2018-08-15 DIAGNOSIS — I8501 Esophageal varices with bleeding: Secondary | ICD-10-CM | POA: Diagnosis not present

## 2018-08-15 HISTORY — PX: ESOPHAGEAL BANDING: SHX5518

## 2018-08-15 HISTORY — PX: ESOPHAGOGASTRODUODENOSCOPY (EGD) WITH PROPOFOL: SHX5813

## 2018-08-15 LAB — GLUCOSE, CAPILLARY
GLUCOSE-CAPILLARY: 261 mg/dL — AB (ref 70–99)
Glucose-Capillary: 253 mg/dL — ABNORMAL HIGH (ref 70–99)

## 2018-08-15 SURGERY — ESOPHAGOGASTRODUODENOSCOPY (EGD) WITH PROPOFOL
Anesthesia: General

## 2018-08-15 MED ORDER — ONDANSETRON HCL 4 MG/2ML IJ SOLN
INTRAMUSCULAR | Status: AC
  Filled 2018-08-15: qty 2

## 2018-08-15 MED ORDER — ONDANSETRON HCL 4 MG/2ML IJ SOLN
INTRAMUSCULAR | Status: DC | PRN
  Administered 2018-08-15: 4 mg via INTRAVENOUS

## 2018-08-15 MED ORDER — PROPOFOL 10 MG/ML IV BOLUS
INTRAVENOUS | Status: DC | PRN
  Administered 2018-08-15 (×2): 15 mg via INTRAVENOUS

## 2018-08-15 MED ORDER — PROPOFOL 500 MG/50ML IV EMUL
INTRAVENOUS | Status: DC | PRN
  Administered 2018-08-15: 15:00:00 via INTRAVENOUS
  Administered 2018-08-15: 125 ug/kg/min via INTRAVENOUS

## 2018-08-15 MED ORDER — PROMETHAZINE HCL 25 MG/ML IJ SOLN: 6.2500 mg | INTRAMUSCULAR | Status: DC | PRN

## 2018-08-15 MED ORDER — MIDAZOLAM HCL 2 MG/2ML IJ SOLN: 0.5000 mg | Freq: Once | INTRAMUSCULAR | Status: DC | PRN

## 2018-08-15 MED ORDER — LACTATED RINGERS IV SOLN
INTRAVENOUS | Status: DC
  Administered 2018-08-15: 14:00:00 via INTRAVENOUS

## 2018-08-15 MED ORDER — CHLORHEXIDINE GLUCONATE CLOTH 2 % EX PADS: 6.0000 | MEDICATED_PAD | Freq: Once | CUTANEOUS | Status: DC

## 2018-08-15 MED ORDER — MIDAZOLAM HCL 2 MG/2ML IJ SOLN
INTRAMUSCULAR | Status: AC
  Filled 2018-08-15: qty 2

## 2018-08-15 MED ORDER — MIDAZOLAM HCL 5 MG/5ML IJ SOLN
INTRAMUSCULAR | Status: DC | PRN
  Administered 2018-08-15: 2 mg via INTRAVENOUS

## 2018-08-15 MED ORDER — STERILE WATER FOR IRRIGATION IR SOLN
Status: DC | PRN
  Administered 2018-08-15: 1.5 mL

## 2018-08-15 NOTE — Anesthesia Preprocedure Evaluation (Signed)
Anesthesia Evaluation  Patient identified by MRN, date of birth, ID band Patient awake    Reviewed: Allergy & Precautions, NPO status , Patient's Chart, lab work & pertinent test results  History of Anesthesia Complications (+) PONV  Airway Mallampati: III  TM Distance: >3 FB Neck ROM: Full    Dental no notable dental hx. (+) Teeth Intact, Chipped   Pulmonary sleep apnea , COPD, former smoker,    Pulmonary exam normal breath sounds clear to auscultation       Cardiovascular Exercise Tolerance: Poor hypertension, Pt. on medications +CHF  Normal cardiovascular examII Rhythm:Regular Rate:Normal     Neuro/Psych  Neuromuscular disease negative psych ROS   GI/Hepatic Neg liver ROS, GERD  Medicated and Controlled,  Endo/Other  diabetesHypothyroidism Morbid obesity  Renal/GU Renal InsufficiencyRenal disease  negative genitourinary   Musculoskeletal  (+) Arthritis , Osteoarthritis,    Abdominal   Peds negative pediatric ROS (+)  Hematology negative hematology ROS (+) anemia ,   Anesthesia Other Findings   Reproductive/Obstetrics negative OB ROS                             Anesthesia Physical Anesthesia Plan  ASA: III  Anesthesia Plan: General   Post-op Pain Management:    Induction: Intravenous  PONV Risk Score and Plan:   Airway Management Planned: Nasal Cannula and Simple Face Mask  Additional Equipment:   Intra-op Plan:   Post-operative Plan: Extubation in OR  Informed Consent: I have reviewed the patients History and Physical, chart, labs and discussed the procedure including the risks, benefits and alternatives for the proposed anesthesia with the patient or authorized representative who has indicated his/her understanding and acceptance.   Dental advisory given  Plan Discussed with: CRNA  Anesthesia Plan Comments:         Anesthesia Quick Evaluation

## 2018-08-15 NOTE — Transfer of Care (Signed)
Immediate Anesthesia Transfer of Care Note  Patient: Penny Martinez  Procedure(s) Performed: ESOPHAGOGASTRODUODENOSCOPY (EGD) WITH PROPOFOL (N/A ) ESOPHAGEAL BANDING (N/A )  Patient Location: PACU  Anesthesia Type:General  Level of Consciousness: awake  Airway & Oxygen Therapy: Patient Spontanous Breathing  Post-op Assessment: Report given to RN  Post vital signs: Reviewed  Last Vitals:  Vitals Value Taken Time  BP 127/61 08/15/2018  2:45 PM  Temp 37.1 C 08/15/2018  2:45 PM  Pulse 67 08/15/2018  2:50 PM  Resp 11 08/15/2018  2:50 PM  SpO2 95 % 08/15/2018  2:50 PM  Vitals shown include unvalidated device data.  Last Pain:  Vitals:   08/15/18 1329  TempSrc: Oral  PainSc: 0-No pain      Patients Stated Pain Goal: 5 (03/40/35 2481)  Complications: No apparent anesthesia complications

## 2018-08-15 NOTE — Discharge Instructions (Signed)
EGD Discharge instructions Please read the instructions outlined below and refer to this sheet in the next few weeks. These discharge instructions provide you with general information on caring for yourself after you leave the hospital. Your doctor may also give you specific instructions. While your treatment has been planned according to the most current medical practices available, unavoidable complications occasionally occur. If you have any problems or questions after discharge, please call your doctor. ACTIVITY  You may resume your regular activity but move at a slower pace for the next 24 hours.   Take frequent rest periods for the next 24 hours.   Walking will help expel (get rid of) the air and reduce the bloated feeling in your abdomen.   No driving for 24 hours (because of the anesthesia (medicine) used during the test).   You may shower.   Do not sign any important legal documents or operate any machinery for 24 hours (because of the anesthesia used during the test).  NUTRITION  Drink plenty of fluids.   You may resume your normal diet.   Begin with a light meal and progress to your normal diet.   Avoid alcoholic beverages for 24 hours or as instructed by your caregiver.  MEDICATIONS  You may resume your normal medications unless your caregiver tells you otherwise.  WHAT YOU CAN EXPECT TODAY  You may experience abdominal discomfort such as a feeling of fullness or gas pains.  FOLLOW-UP  Your doctor will discuss the results of your test with you.  SEEK IMMEDIATE MEDICAL ATTENTION IF ANY OF THE FOLLOWING OCCUR:  Excessive nausea (feeling sick to your stomach) and/or vomiting.   Severe abdominal pain and distention (swelling).   Trouble swallowing.   Temperature over 101 F (37.8 C).   Rectal bleeding or vomiting of blood.    Clear liquid diet remainder of today  Begin full liquid diet tomorrow  X 3 days  Begin soft diet for 3 days; then advance as  tolerated  Stop aspirin until instructed otherwise  Repeat EGD with band ligation in about 4 weeks.  Use Chloraseptic spray for any transient sore throat or chest pain you may experience    Clear Liquid Diet     *****for the remainder of today***** A clear liquid diet means that you only have liquids that you can see through. You do not eat any food on this diet. Most people need to follow this diet for only a short time. What do I need to know about this diet?  A clear liquid is a liquid that you can see through when you hold it up to a light.  This diet does not give you all the nutrients that you need. Choose a variety of the liquids that your doctor says you can drink on this diet. That way, you will get as many nutrients as possible.  If you are not sure whether you can have certain items, ask your doctor. What can I have?  Water and flavored water.  Fruit juices that do not have pulp, such as cranberry juice and apple juice.  Tea and coffee without milk or cream.  Clear bouillon or broth.  Broth-based soups that have been strained.  Flavored gelatins.  Honey.  Sugar water.  Frozen ice or frozen ice pops that do not have any milk, yogurt, fruit pieces, or fruit pulp in them.  Clear sodas.  Clear sports drinks. The items listed above may not be a complete list of recommended liquids. Contact  your food and nutrition expert (dietitian) for more options. What can I not have?  Juices that have pulp.  Milk.  Cream or cream-based soups.  Yogurt. The items listed above may not be a complete list of liquids to avoid. Contact your food and nutrition expert for more information. Summary  A clear liquid diet is a diet that includes only liquids that you can see through.  The goal of this diet is to help you recover.  Make sure to avoid liquids with milk, cream, or pulp while you are on this diet. This information is not intended to replace advice given to you by  your health care provider. Make sure you discuss any questions you have with your health care provider. Document Released: 08/06/2008 Document Revised: 04/06/2016 Document Reviewed: 07/21/2013 Elsevier Interactive Patient Education  2017 [place]    Full Liquid Diet    On Tuesday,  Wednesday AND Thursday  A full liquid diet may be used:  To help you transition from a clear liquid diet to a soft diet.  When your body is healing and can only tolerate foods that are easy to digest.  Before or after certain a procedure, test, or surgery (such as stomach or intestinal surgeries).  If you have trouble swallowing or chewing.  A full liquid diet includes fluids and foods that are liquid or will become liquid at room temperature. The full liquid diet gives you the proteins, fluids, salts, and minerals that you need for energy. If you continue this diet for more than 72 hours, talk to your health care provider about how many calories you need to consume. If you continue the diet for more than 5 days, talk to your health care provider about taking a multivitamin or a nutritional supplement. What do I need to know about a full liquid diet?  You may have any liquid.  You may have any food that becomes a liquid at room temperature. The food is considered a liquid if it can be poured off a spoon at room temperature.  Drink one serving of citrus or vitamin C-enriched fruit juice daily. What foods can I eat? Grains Any grain food that can be pureed in soup (such as crackers, pasta, and rice). Hot cereal (such as farina or oatmeal) that has been blended. Talk to your health care provider or dietitian about these foods. Vegetables Pulp-free tomato or vegetable juice. Vegetables pureed in soup. Fruits Fruit juice, including nectars and juices with pulp. Meats and Other Protein Sources Eggs in custard, eggnog mix, and eggs used in ice cream or pudding. Strained meats, like in baby food, may be  allowed. Consult your health care provider. Dairy Milk and milk-based beverages, including milk shakes and instant breakfast mixes. Smooth yogurt. Pureed cottage cheese. Avoid these foods if they are not well tolerated. Beverages All beverages, including liquid nutritional supplements. Ask your health care provider if you can have carbonated beverages. They may not be well tolerated. Condiments Iodized salt, pepper, spices, and flavorings. Cocoa powder. Vinegar, ketchup, yellow mustard, smooth sauces (such as hollandaise, cheese sauce, or white sauce), and soy sauce. Sweets and Desserts Custard, smooth pudding. Flavored gelatin. Tapioca, junket. Plain ice cream, sherbet, fruit ices. Frozen ice pops, frozen fudge pops, pudding pops, and other frozen bars with cream. Syrups, including chocolate syrup. Sugar, honey, jelly. Fats and Oils Margarine, butter, cream, sour cream, and oils. Other Broth and cream soups. Strained, broth-based soups. The items listed above may not be a complete list  of recommended foods or beverages. Contact your dietitian for more options. What foods can I not eat? Grains All breads. Grains are not allowed unless they are pureed into soup. Vegetables Vegetables are not allowed unless they are juiced, or cooked and pureed into soup. Fruits Fruits are not allowed unless they are juiced. Meats and Other Protein Sources Any meat or fish. Cooked or raw eggs. Nut butters. Dairy Cheese. Condiments Stone ground mustards. Fats and Oils Fats that are coarse or chunky. Sweets and Desserts Ice cream or other frozen desserts that have any solids in them or on top, such as nuts, chocolate chips, and pieces of cookies. Cakes. Cookies. Candy. Others Soups with chunks or pieces in them. The items listed above may not be a complete list of foods and beverages to avoid. Contact your dietitian for more information. This information is not intended to replace advice given to you by  your health care provider. Make sure you discuss any questions you have with your health care provider. Document Released: 08/24/2005 Document Revised: 01/30/2016 Document Reviewed: 06/29/2013 Elsevier Interactive Patient Education  2017 [place] Meal Plan     *****Friday< Saturday AND Sunday***** A soft-food meal plan includes foods that are safe and easy to swallow. This meal plan typically is used:  If you are having trouble chewing or swallowing foods.  As a transition meal plan after only having had liquid meals for a long period.  What do I need to know about the soft-food meal plan? A soft-food meal plan includes tender foods that are soft and easy to chew and swallow. In most cases, bite-sized pieces of food are easier to swallow. A bite-sized piece is about  inch or smaller. Foods in this plan do not need to be ground or pureed. Foods that are very hard, crunchy, or sticky should be avoided. Also, breads, cereals, yogurts, and desserts with nuts, seeds, or fruits should be avoided. What foods can I eat? Grains Rice and wild rice. Moist bread, dressing, pasta, and noodles. Well-moistened dry or cooked cereals, such as farina (cooked wheat cereal), oatmeal, or grits. Biscuits, breads, muffins, pancakes, and waffles that have been well moistened. Vegetables Shredded lettuce. Cooked, tender vegetables, including potatoes without skins. Vegetable juices. Broths or creamed soups made with vegetables that are not stringy or chewy. Strained tomatoes (without seeds). Fruits Canned or well-cooked fruits. Soft (ripe), peeled fresh fruits, such as peaches, nectarines, kiwi, cantaloupe, honeydew melon, and watermelon (without seeds). Soft berries with small seeds, such as strawberries. Fruit juices (without pulp). Meats and Other Protein Sources Moist, tender, lean beef. Mutton. Lamb. Veal. Chicken. [place]. Liver. Ham. Fish without bones. Eggs. Dairy Milk, milk drinks, and  cream. Plain cream cheese and cottage cheese. Plain yogurt. Sweets/Desserts Flavored gelatin desserts. Custard. Plain ice cream, frozen yogurt, sherbet, milk shakes, and malts. Plain cakes and cookies. Plain hard candy. Other Butter, margarine (without trans fat), and cooking oils. Mayonnaise. Cream sauces. Mild spices, salt, and sugar. Syrup, molasses, honey, and jelly. The items listed above may not be a complete list of recommended foods or beverages. Contact your dietitian for more options. What foods are not recommended? Grains Dry bread, toast, crackers that have not been moistened. Coarse or dry cereals, such as bran, granola, and shredded wheat. Tough or chewy crusty breads, such as [place] bread or baguettes. Vegetables Corn. Raw vegetables except shredded lettuce. Cooked vegetables that are tough or stringy. Tough, crisp, fried potatoes and potato skins. Fruits Fresh fruits  with skins or seeds or both, such as apples, pears, or grapes. Stringy, high-pulp fruits, such as papaya, pineapple, coconut, or mango. Fruit leather, fruit roll-ups, and all dried fruits. Meats and Other Protein Sources Sausages and hot dogs. Meats with gristle. Fish with bones. Nuts, seeds, and chunky peanut or other nut butters. Sweets/Desserts Cakes or cookies that are very dry or chewy. The items listed above may not be a complete list of foods and beverages to avoid. Contact your dietitian for more information. This information is not intended to replace advice given to you by your health care provider. Make sure you discuss any questions you have with your health care provider. Document Released: 12/01/2007 Document Revised: 01/30/2016 Document Reviewed: 07/21/2013 Elsevier Interactive Patient Education  2017 [place], Care After These instructions provide you with information about caring for yourself after your procedure. Your health care provider may also give you  more specific instructions. Your treatment has been planned according to current medical practices, but problems sometimes occur. Call your health care provider if you have any problems or questions after your procedure. What can I expect after the procedure? After your procedure, it is common to:  Feel sleepy for several hours.  Feel clumsy and have poor balance for several hours.  Feel forgetful about what happened after the procedure.  Have poor judgment for several hours.  Feel nauseous or vomit.  Have a sore throat if you had a breathing tube during the procedure.  Follow these instructions at home: For at least 24 hours after the procedure:   Do not: ? Participate in activities in which you could fall or become injured. ? Drive. ? Use heavy machinery. ? Drink alcohol. ? Take sleeping pills or medicines that cause drowsiness. ? Make important decisions or sign legal documents. ? Take care of children on your own.  Rest. Eating and drinking  Follow the diet that is recommended by your health care provider.  If you vomit, drink water, juice, or soup when you can drink without vomiting.  Make sure you have little or no nausea before eating solid foods. General instructions  Have a responsible adult stay with you until you are awake and alert.  Take over-the-counter and prescription medicines only as told by your health care provider.  If you smoke, do not smoke without supervision.  Keep all follow-up visits as told by your health care provider. This is important. Contact a health care provider if:  You keep feeling nauseous or you keep vomiting.  You feel light-headed.  You develop a rash.  You have a fever. Get help right away if:  You have trouble breathing. This information is not intended to replace advice given to you by your health care provider. Make sure you discuss any questions you have with your health care provider. Document Released:  12/15/2015 Document Revised: 04/15/2016 Document Reviewed: 12/15/2015 Elsevier Interactive Patient Education  [employer].

## 2018-08-15 NOTE — Op Note (Signed)
[facility] Patient Name: Penny Martinez Procedure Date: 08/15/2018 1:51 PM MRN: [id number] Date of Birth: 1950/10/04 Attending MD: Gennette Pac , MD CSN: [id number] Age: 67 Admit Type: Outpatient Procedure:                Upper GI endoscopy Indications:              Surveillance procedure Providers:                Gennette Pac, MD, Sterling Big, RN,                            Dyann Ruddle Referring MD:              Medicines:                Propofol per Anesthesia Complications:            No immediate complications. Estimated Blood Loss:     Estimated blood loss: none. Procedure:                Pre-Anesthesia Assessment:                           - Prior to the procedure, a History and Physical                            was performed, and patient medications and                            allergies were reviewed. The patient's tolerance of                            previous anesthesia was also reviewed. The risks                            and benefits of the procedure and the sedation                            options and risks were discussed with the patient.                            All questions were answered, and informed consent                            was obtained. Prior Anticoagulants: The patient has                            taken no previous anticoagulant or antiplatelet                            agents. ASA Grade Assessment: III - A patient with                            severe systemic disease. After reviewing the risks  and benefits, the patient was deemed in                            satisfactory condition to undergo the procedure.                           After obtaining informed consent, the endoscope was                            passed under direct vision. Throughout the                            procedure, the patient's blood pressure, pulse, and                            oxygen saturations were  monitored continuously. The                            GIF-[id number] ([id number]) was introduced through the and                            advanced to the second part of duodenum. The upper                            GI endoscopy was accomplished without difficulty.                            The patient tolerated the procedure well. Scope In: 2:26:31 PM Scope Out: 2:38:33 PM Total Procedure Duration: 0 hours 12 minutes 2 seconds  Findings:      Grade III varices - 4 columns were found in the lower third and mid       esophagus. Seven bands were successfully placed with incomplete       eradication of varices. At least one band on each column. Hemostasis       maintained throughout the procedure.Marland Kitchen 1 "cherry red spot" directly       ligated on 1 column      Portal hypertensive gastropathy was found in the entire examined stomach.      The exam was otherwise without abnormality.      The duodenal bulb and second portion of the duodenum were normal. Impression:               - Grade III esophageal varices with bleeding                            stigmata. Incompletely eradicated. Banded.                           - Portal hypertensive gastropathy.                           - The examination was otherwise normal.                           - Normal duodenal bulb and second portion of the  duodenum.                           - No specimens collected. Moderate Sedation:      Moderate (conscious) sedation was personally administered by an       anesthesia professional. The following parameters were monitored: oxygen       saturation, heart rate, blood pressure, respiratory rate, EKG, adequacy       of pulmonary ventilation, and response to care. Recommendation:           - Written discharge instructions were provided to                            the patient.                           - Return to normal activities tomorrow.                           - Clear liquid diet.                            - Continue present medications.                           - Repeat upper endoscopy in 4 weeks for retreatment.                           - Return to GI clinic in 1 month. Chloraseptic for                            any transient sore throat/chest pain which may be                            experienced. Hold aspirin until after next banding                            session. Procedure Code(s):        --- Professional ---                           [phone], Esophagogastroduodenoscopy, flexible,                            transoral; with band ligation of esophageal/gastric                            varices Diagnosis Code(s):        --- Professional ---                           I85.00, Esophageal varices without bleeding                           K76.6, Portal hypertension                           K31.89, Other diseases of  stomach and duodenum CPT copyright 2018 American Medical Association. All rights reserved. The codes documented in this report are preliminary and upon coder review may  be revised to meet current compliance requirements. Penny Martinez. Penny Cathell, MD Gennette Pac, MD 08/15/2018 2:51:33 PM This report has been signed electronically. Number of Addenda: 0

## 2018-08-15 NOTE — Telephone Encounter (Signed)
Received a call from a Nurse from [facility]. [facility] would like pt to repeat EGD & band with ligation in 4 weeks.

## 2018-08-15 NOTE — Interval H&P Note (Signed)
History and Physical Interval Note:  08/15/2018 2:12 PM  Penny Martinez  has presented today for surgery, with the diagnosis of cirrhosis, variceal banding  The various methods of treatment have been discussed with the patient and family. After consideration of risks, benefits and other options for treatment, the patient has consented to  Procedure(s) with comments: ESOPHAGOGASTRODUODENOSCOPY (EGD) WITH PROPOFOL (N/A) - 1:45pm ESOPHAGEAL BANDING (N/A) as a surgical intervention .  The patient's history has been reviewed, patient examined, no change in status, stable for surgery.  I have reviewed the patient's chart and labs.  Questions were answered to the patient's satisfaction.     Penny Martinez  No change.  Discussed elective esophageal banding with patient, patient's husband and sister at the bedside today.  Risk of bleeding and discomfort following procedure reviewed.  The need for subsequent procedures also  reviewed. The risks, benefits, limitations, alternatives and imponderables have been reviewed with the patient. Potential for esophageal dilation, biopsy, etc. have also been reviewed.  Questions have been answered. All parties agreeable.

## 2018-08-16 ENCOUNTER — Other Ambulatory Visit (HOSPITAL_COMMUNITY): Payer: Medicare Other

## 2018-08-16 ENCOUNTER — Ambulatory Visit (HOSPITAL_COMMUNITY): Payer: Medicare Other | Admitting: Internal Medicine

## 2018-08-16 ENCOUNTER — Other Ambulatory Visit: Payer: Self-pay

## 2018-08-16 DIAGNOSIS — K7460 Unspecified cirrhosis of liver: Secondary | ICD-10-CM

## 2018-08-16 DIAGNOSIS — I8500 Esophageal varices without bleeding: Secondary | ICD-10-CM

## 2018-08-16 NOTE — Telephone Encounter (Signed)
Called and informed pt she will receive pre-op phone call 09/08/18. Procedure instructions mailed.

## 2018-08-16 NOTE — Anesthesia Postprocedure Evaluation (Signed)
Anesthesia Post Note  Patient: Penny Martinez  Procedure(s) Performed: ESOPHAGOGASTRODUODENOSCOPY (EGD) WITH PROPOFOL (N/A ) ESOPHAGEAL BANDING (N/A )  Patient location during evaluation: PACU Anesthesia Type: General Level of consciousness: awake and alert and oriented Pain management: pain level controlled Vital Signs Assessment: post-procedure vital signs reviewed and stable Respiratory status: spontaneous breathing Cardiovascular status: blood pressure returned to baseline Postop Assessment: no apparent nausea or vomiting Anesthetic complications: no Comments: Late entry     Last Vitals:  Vitals:   08/15/18 1500 08/15/18 1513  BP: 109/71 (!) 157/64  Pulse: 77 86  Resp: 12 14  Temp:    SpO2: 94% 92%    Last Pain:  Vitals:   08/15/18 1513  TempSrc:   PainSc: 3                  Batsheva Stevick

## 2018-08-16 NOTE — Telephone Encounter (Signed)
Called pt, EGD/varices banding w/Propofol w/RMR scheduled for 09/15/18 at 1:15pm. Orders entered. Pt aware to hold Aspirin until after next banding session. Will mail instructions after pre-op is scheduled.

## 2018-08-17 ENCOUNTER — Telehealth: Payer: Self-pay

## 2018-08-18 NOTE — Progress Notes (Signed)
CT ON RECALL

## 2018-08-18 NOTE — Telephone Encounter (Signed)
Error

## 2018-08-24 ENCOUNTER — Other Ambulatory Visit: Payer: Self-pay | Admitting: "Endocrinology

## 2018-08-25 ENCOUNTER — Telehealth: Payer: Self-pay | Admitting: Internal Medicine

## 2018-08-25 NOTE — Telephone Encounter (Signed)
[phone]  Please call patient, she wants to know when she can start eating protein again

## 2018-08-26 NOTE — Telephone Encounter (Signed)
today

## 2018-08-26 NOTE — Telephone Encounter (Signed)
Pt has been eating a soft diet but feels she needs some protein added in her diet. She would like to start with peanut butter. Please advise.

## 2018-08-26 NOTE — Telephone Encounter (Signed)
Pt.notified

## 2018-08-29 NOTE — Patient Instructions (Signed)
Penny Martinez  08/29/2018     @PREFPERIOPPHARMACY @   Your procedure is scheduled on  09/15/2018 .  Report to [name] at  1145   A.M.  Call this number if you have problems the morning of surgery:  [phone]   Remember:  Follow the diet and prep instructions given to you by Dr Roseanne Kaufman office.                       Take these medicines the morning of surgery with A SIP OF WATER  Benazepril, prevacid, levothyroxine. Only take 1/2 of your usual night time insulin the night before your procedure. DO NOT take any medications for diabetes the morning of your procedure.    Do not wear jewelry, make-up or nail polish.  Do not wear lotions, powders, or perfumes, or deodorant.  Do not shave 48 hours prior to surgery.  Men may shave face and neck.  Do not bring valuables to the hospital.  [facility] is not responsible for any belongings or valuables.  Contacts, dentures or bridgework may not be worn into surgery.  Leave your suitcase in the car.  After surgery it may be brought to your room.  For patients admitted to the hospital, discharge time will be determined by your treatment team.  Patients discharged the day of surgery will not be allowed to drive home.   Name and phone number of your driver:   family Special instructions:  None  Please read over the following fact sheets that you were given. Anesthesia Post-op Instructions and Care and Recovery After Surgery       Upper Endoscopy, Adult Upper endoscopy is a procedure to look inside the upper GI (gastrointestinal) tract. The upper GI tract is made up of:  The part of the body that moves food from your mouth to your stomach (esophagus).  The stomach.  The first part of your small intestine (duodenum). This procedure is also called esophagogastroduodenoscopy (EGD) or gastroscopy. In this procedure, your health care provider passes a thin, flexible tube (endoscope) through your mouth and down your  esophagus into your stomach. A small camera is attached to the end of the tube. Images from the camera appear on a monitor in the exam room. During this procedure, your health care provider may also remove a small piece of tissue to be sent to a lab and examined under a microscope (biopsy). Your health care provider may do an upper endoscopy to diagnose cancers of the upper GI tract. You may also have this procedure to find the cause of other conditions, such as:  Stomach pain.  Heartburn.  Pain or problems when swallowing.  Nausea and vomiting.  Stomach bleeding.  Stomach ulcers. Tell a health care provider about:  Any allergies you have.  All medicines you are taking, including vitamins, herbs, eye drops, creams, and over-the-counter medicines.  Any problems you or family members have had with anesthetic medicines.  Any blood disorders you have.  Any surgeries you have had.  Any medical conditions you have.  Whether you are pregnant or may be pregnant. What are the risks? Generally, this is a safe procedure. However, problems may occur, including:  Infection.  Bleeding.  Allergic reactions to medicines.  A tear or hole (perforation) in the esophagus, stomach, or duodenum. What happens before the procedure? Staying hydrated Follow instructions from your health care provider about hydration, which may  include:  Up to 2 hours before the procedure - you may continue to drink clear liquids, such as water, clear fruit juice, black coffee, and plain tea.  Eating and drinking restrictions Follow instructions from your health care provider about eating and drinking, which may include:  8 hours before the procedure - stop eating heavy meals or foods, such as meat, fried foods, or fatty foods.  6 hours before the procedure - stop eating light meals or foods, such as toast or cereal.  6 hours before the procedure - stop drinking milk or drinks that contain milk.  2 hours  before the procedure - stop drinking clear liquids. Medicines Ask your health care provider about:  Changing or stopping your regular medicines. This is especially important if you are taking diabetes medicines or blood thinners.  Taking medicines such as aspirin and ibuprofen. These medicines can thin your blood. Do not take these medicines unless your health care provider tells you to take them.  Taking over-the-counter medicines, vitamins, herbs, and supplements. General instructions  Plan to have someone take you home from the hospital or clinic.  If you will be going home right after the procedure, plan to have someone with you for 24 hours.  Ask your health care provider what steps will be taken to help prevent infection. What happens during the procedure?   An IV will be inserted into one of your veins.  You may be given one or more of the following: ? A medicine to help you relax (sedative). ? A medicine to numb the throat (local anesthetic).  You will lie on your left side on an exam table.  Your health care provider will pass the endoscope through your mouth and down your esophagus.  Your health care provider will use the scope to check the inside of your esophagus, stomach, and duodenum. Biopsies may be taken.  The endoscope will be removed. The procedure may vary among health care providers and hospitals. What happens after the procedure?  Your blood pressure, heart rate, breathing rate, and blood oxygen level will be monitored until you leave the hospital or clinic.  Do not drive for 24 hours if you were given a sedative during your procedure.  When your throat is no longer numb, you may be given some fluids to drink.  It is up to you to get the results of your procedure. Ask your health care provider, or the department that is doing the procedure, when your results will be ready. Summary  Upper endoscopy is a procedure to look inside the upper GI  tract.  During the procedure, an IV will be inserted into one of your veins. You may be given a medicine to help you relax.  A medicine will be used to numb your throat.  The endoscope will be passed through your mouth and down your esophagus. This information is not intended to replace advice given to you by your health care provider. Make sure you discuss any questions you have with your health care provider. Document Released: 08/21/2000 Document Revised: 01/24/2018 Document Reviewed: 01/24/2018 Elsevier Interactive Patient Education  2019 [place] [facility], Adult, Care After This sheet gives you information about how to care for yourself after your procedure. Your health care provider may also give you more specific instructions. If you have problems or questions, contact your health care provider. What can I expect after the procedure? After the procedure, it is common to have:  A sore throat.  Mild stomach pain or discomfort.  Bloating.  Nausea. Follow these instructions at home:   Follow instructions from your health care provider about what to eat or drink after your procedure.  Return to your normal activities as told by your health care provider. Ask your health care provider what activities are safe for you.  Take over-the-counter and prescription medicines only as told by your health care provider.  Do not drive for 24 hours if you were given a sedative during your procedure.  Keep all follow-up visits as told by your health care provider. This is important. Contact a health care provider if you have:  A sore throat that lasts longer than one day.  Trouble swallowing. Get help right away if:  You vomit blood or your vomit looks like coffee grounds.  You have: ? A fever. ? Bloody, black, or tarry stools. ? A severe sore throat or you cannot swallow. ? Difficulty breathing. ? Severe pain in your chest or abdomen. Summary  After the  procedure, it is common to have a sore throat, mild stomach discomfort, bloating, and nausea.  Do not drive for 24 hours if you were given a sedative during the procedure.  Follow instructions from your health care provider about what to eat or drink after your procedure.  Return to your normal activities as told by your health care provider. This information is not intended to replace advice given to you by your health care provider. Make sure you discuss any questions you have with your health care provider. Document Released: 02/23/2012 Document Revised: 01/24/2018 Document Reviewed: 01/24/2018 Elsevier Interactive Patient Education  2019 [place] Anesthesia is a term that refers to techniques, procedures, and medicines that help a person stay safe and comfortable during a medical procedure. Monitored anesthesia care, or sedation, is one type of anesthesia. Your anesthesia specialist may recommend sedation if you will be having a procedure that does not require you to be unconscious, such as:  Cataract surgery.  A dental procedure.  A biopsy.  A colonoscopy. During the procedure, you may receive a medicine to help you relax (sedative). There are three levels of sedation:  Mild sedation. At this level, you may feel awake and relaxed. You will be able to follow directions.  Moderate sedation. At this level, you will be sleepy. You may not remember the procedure.  Deep sedation. At this level, you will be asleep. You will not remember the procedure. The more medicine you are given, the deeper your level of sedation will be. Depending on how you respond to the procedure, the anesthesia specialist may change your level of sedation or the type of anesthesia to fit your needs. An anesthesia specialist will monitor you closely during the procedure. Let your health care provider know about:  Any allergies you have.  All medicines you are taking, including  vitamins, herbs, eye drops, creams, and over-the-counter medicines.  Any use of steroids (by mouth or as a cream).  Any problems you or family members have had with sedatives and anesthetic medicines.  Any blood disorders you have.  Any surgeries you have had.  Any medical conditions you have, such as sleep apnea.  Whether you are pregnant or may be pregnant.  Any use of cigarettes, alcohol, or street drugs. What are the risks? Generally, this is a safe procedure. However, problems may occur, including:  Getting too much medicine (oversedation).  Nausea.  Allergic reaction to medicines.  Trouble breathing. If  this happens, a breathing tube may be used to help with breathing. It will be removed when you are awake and breathing on your own.  Heart trouble.  Lung trouble. Before the procedure Staying hydrated Follow instructions from your health care provider about hydration, which may include:  Up to 2 hours before the procedure - you may continue to drink clear liquids, such as water, clear fruit juice, black coffee, and plain tea. Eating and drinking restrictions Follow instructions from your health care provider about eating and drinking, which may include:  8 hours before the procedure - stop eating heavy meals or foods such as meat, fried foods, or fatty foods.  6 hours before the procedure - stop eating light meals or foods, such as toast or cereal.  6 hours before the procedure - stop drinking milk or drinks that contain milk.  2 hours before the procedure - stop drinking clear liquids. Medicines Ask your health care provider about:  Changing or stopping your regular medicines. This is especially important if you are taking diabetes medicines or blood thinners.  Taking medicines such as aspirin and ibuprofen. These medicines can thin your blood. Do not take these medicines before your procedure if your health care provider instructs you not to. Tests and  exams  You will have a physical exam.  You may have blood tests done to show: ? How well your kidneys and liver are working. ? How well your blood can clot. General instructions  Plan to have someone take you home from the hospital or clinic.  If you will be going home right after the procedure, plan to have someone with you for 24 hours.  What happens during the procedure?  Your blood pressure, heart rate, breathing, level of pain and overall condition will be monitored.  An IV tube will be inserted into one of your veins.  Your anesthesia specialist will give you medicines as needed to keep you comfortable during the procedure. This may mean changing the level of sedation.  The procedure will be performed. After the procedure  Your blood pressure, heart rate, breathing rate, and blood oxygen level will be monitored until the medicines you were given have worn off.  Do not drive for 24 hours if you received a sedative.  You may: ? Feel sleepy, clumsy, or nauseous. ? Feel forgetful about what happened after the procedure. ? Have a sore throat if you had a breathing tube during the procedure. ? Vomit. This information is not intended to replace advice given to you by your health care provider. Make sure you discuss any questions you have with your health care provider. Document Released: 05/20/2005 Document Revised: 01/31/2016 Document Reviewed: 12/15/2015 Elsevier Interactive Patient Education  2019 [place], Care After These instructions provide you with information about caring for yourself after your procedure. Your health care provider may also give you more specific instructions. Your treatment has been planned according to current medical practices, but problems sometimes occur. Call your health care provider if you have any problems or questions after your procedure. What can I expect after the procedure? After your procedure, you  may:  Feel sleepy for several hours.  Feel clumsy and have poor balance for several hours.  Feel forgetful about what happened after the procedure.  Have poor judgment for several hours.  Feel nauseous or vomit.  Have a sore throat if you had a breathing tube during the procedure. Follow these instructions at home: For at  least 24 hours after the procedure:      Have a responsible adult stay with you. It is important to have someone help care for you until you are awake and alert.  Rest as needed.  Do not: ? Participate in activities in which you could fall or become injured. ? Drive. ? Use heavy machinery. ? Drink alcohol. ? Take sleeping pills or medicines that cause drowsiness. ? Make important decisions or sign legal documents. ? Take care of children on your own. Eating and drinking  Follow the diet that is recommended by your health care provider.  If you vomit, drink water, juice, or soup when you can drink without vomiting.  Make sure you have little or no nausea before eating solid foods. General instructions  Take over-the-counter and prescription medicines only as told by your health care provider.  If you have sleep apnea, surgery and certain medicines can increase your risk for breathing problems. Follow instructions from your health care provider about wearing your sleep device: ? Anytime you are sleeping, including during daytime naps. ? While taking prescription pain medicines, sleeping medicines, or medicines that make you drowsy.  If you smoke, do not smoke without supervision.  Keep all follow-up visits as told by your health care provider. This is important. Contact a health care provider if:  You keep feeling nauseous or you keep vomiting.  You feel light-headed.  You develop a rash.  You have a fever. Get help right away if:  You have trouble breathing. Summary  For several hours after your procedure, you may feel sleepy and have  poor judgment.  Have a responsible adult stay with you for at least 24 hours or until you are awake and alert. This information is not intended to replace advice given to you by your health care provider. Make sure you discuss any questions you have with your health care provider. Document Released: 12/15/2015 Document Revised: 04/09/2017 Document Reviewed: 12/15/2015 Elsevier Interactive Patient Education  2019 [employer].

## 2018-09-02 ENCOUNTER — Encounter (HOSPITAL_COMMUNITY): Payer: Self-pay | Admitting: Internal Medicine

## 2018-09-06 ENCOUNTER — Other Ambulatory Visit: Payer: Self-pay | Admitting: "Endocrinology

## 2018-09-08 ENCOUNTER — Encounter (HOSPITAL_COMMUNITY): Payer: Self-pay

## 2018-09-08 ENCOUNTER — Encounter (HOSPITAL_COMMUNITY)
Admission: RE | Admit: 2018-09-08 | Discharge: 2018-09-08 | Disposition: A | Discharge status: Home / Self Care | Payer: Medicare Other | Source: Ambulatory Visit | Attending: Internal Medicine | Admitting: Internal Medicine

## 2018-09-08 ENCOUNTER — Other Ambulatory Visit (HOSPITAL_COMMUNITY): Payer: Medicare Other

## 2018-09-12 ENCOUNTER — Encounter (HOSPITAL_COMMUNITY): Payer: Self-pay | Admitting: Internal Medicine

## 2018-09-12 ENCOUNTER — Inpatient Hospital Stay (HOSPITAL_COMMUNITY): Payer: Medicare Other | Attending: Internal Medicine

## 2018-09-12 ENCOUNTER — Other Ambulatory Visit: Payer: Self-pay

## 2018-09-12 ENCOUNTER — Inpatient Hospital Stay (HOSPITAL_BASED_OUTPATIENT_CLINIC_OR_DEPARTMENT_OTHER): Payer: Medicare Other | Admitting: Internal Medicine

## 2018-09-12 VITALS — BP 115/46 | HR 81 | Temp 98.4°F | Resp 16 | Wt 219.2 lb

## 2018-09-12 DIAGNOSIS — I10 Essential (primary) hypertension: Secondary | ICD-10-CM | POA: Diagnosis not present

## 2018-09-12 DIAGNOSIS — Z87891 Personal history of nicotine dependence: Secondary | ICD-10-CM | POA: Diagnosis not present

## 2018-09-12 DIAGNOSIS — D696 Thrombocytopenia, unspecified: Secondary | ICD-10-CM | POA: Diagnosis not present

## 2018-09-12 DIAGNOSIS — D500 Iron deficiency anemia secondary to blood loss (chronic): Secondary | ICD-10-CM | POA: Insufficient documentation

## 2018-09-12 DIAGNOSIS — K922 Gastrointestinal hemorrhage, unspecified: Secondary | ICD-10-CM | POA: Insufficient documentation

## 2018-09-12 DIAGNOSIS — E039 Hypothyroidism, unspecified: Secondary | ICD-10-CM | POA: Insufficient documentation

## 2018-09-12 DIAGNOSIS — E611 Iron deficiency: Secondary | ICD-10-CM

## 2018-09-12 LAB — CBC WITH DIFFERENTIAL/PLATELET
Abs Immature Granulocytes: 0.02 10*3/uL (ref 0.00–0.07)
BASOS PCT: 1 %
Basophils Absolute: 0 10*3/uL (ref 0.0–0.1)
Eosinophils Absolute: 0.1 10*3/uL (ref 0.0–0.5)
Eosinophils Relative: 2 %
HCT: 40.1 % (ref 36.0–46.0)
Hemoglobin: 12.5 g/dL (ref 12.0–15.0)
Immature Granulocytes: 1 %
Lymphocytes Relative: 20 %
Lymphs Abs: 0.9 10*3/uL (ref 0.7–4.0)
MCH: 29.1 pg (ref 26.0–34.0)
MCHC: 31.2 g/dL (ref 30.0–36.0)
MCV: 93.3 fL (ref 80.0–100.0)
MONO ABS: 0.4 10*3/uL (ref 0.1–1.0)
MONOS PCT: 9 %
Neutro Abs: 3 10*3/uL (ref 1.7–7.7)
Neutrophils Relative %: 67 %
Platelets: 74 10*3/uL — ABNORMAL LOW (ref 150–400)
RBC: 4.3 MIL/uL (ref 3.87–5.11)
RDW: 16.8 % — ABNORMAL HIGH (ref 11.5–15.5)
WBC: 4.4 10*3/uL (ref 4.0–10.5)
nRBC: 0 % (ref 0.0–0.2)

## 2018-09-12 LAB — COMPREHENSIVE METABOLIC PANEL
ALT: 17 U/L (ref 0–44)
AST: 32 U/L (ref 15–41)
Albumin: 3.5 g/dL (ref 3.5–5.0)
Alkaline Phosphatase: 112 U/L (ref 38–126)
Anion gap: 7 (ref 5–15)
BUN: 13 mg/dL (ref 8–23)
CALCIUM: 8.6 mg/dL — AB (ref 8.9–10.3)
CO2: 29 mmol/L (ref 22–32)
Chloride: 100 mmol/L (ref 98–111)
Creatinine, Ser: 0.97 mg/dL (ref 0.44–1.00)
GFR calc Af Amer: >60 mL/min (ref >60)
GFR calc non Af Amer: >60 mL/min (ref >60)
Glucose, Bld: 232 mg/dL — ABNORMAL HIGH (ref 70–99)
Potassium: 3.8 mmol/L (ref 3.5–5.1)
Sodium: 136 mmol/L (ref 135–145)
Total Bilirubin: 0.6 mg/dL (ref 0.3–1.2)
Total Protein: 6.7 g/dL (ref 6.5–8.1)

## 2018-09-12 LAB — FERRITIN: Ferritin: 75 ng/mL (ref 11–307)

## 2018-09-12 LAB — LACTATE DEHYDROGENASE: LDH: 174 U/L (ref 98–192)

## 2018-09-12 NOTE — Patient Instructions (Signed)
[facility]  Discharge Instructions: You saw Dr. Higgs today                               _______________________________________________________________  Thank you for choosing [facility] to provide your oncology and hematology care.  To afford each patient quality time with our providers, please arrive at least 15 minutes before your scheduled appointment.  You need to re-schedule your appointment if you arrive 10 or more minutes late.  We strive to give you quality time with our providers, and arriving late affects you and other patients whose appointments are after yours.  Also, if you no show three or more times for appointments you may be dismissed from the clinic.  Again, thank you for choosing [facility]. Our hope is that these requests will allow you access to exceptional care and in a timely manner. _______________________________________________________________  If you have questions after your visit, please contact our office at [phone] between the hours of 8:30 a.m. and 5:00 p.m. Voicemails left after 4:30 p.m. will not be returned until the following business day. _______________________________________________________________  For prescription refill requests, have your pharmacy contact our office. _______________________________________________________________  Recommendations made by the consultant and any test results will be sent to your referring physician. _______________________________________________________________ 

## 2018-09-12 NOTE — Progress Notes (Signed)
Diagnosis Iron deficiency anemia due to chronic blood loss - Plan: CBC with Differential/Platelet, Comprehensive metabolic panel, Lactate dehydrogenase, Ferritin  Staging Cancer Staging No matching staging information was found for the patient.  Assessment and Plan:   1.  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  Hospitalized in 2015 secondary to symptomatic anemia requiring PRBC transfusion. Capsule study with multiple polypoid like lesions throughout small bowel, scattered superficial non-bleeding erosions.  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    Pt was last treated with IV iron on 06/30/2018.    Labs done 09/12/2018 reviewed and showed WBC 4.4 HB 12.5 plts 74,000.  Chemistries WNL with K+ 3.8 Cr 0.97 and normal LFTs.  Ferritin is improved from 7 to 75.  She will have repeat labs in 12/2018.    2.  Thrombocytopenia.  Plt count is 74,000.  LDH WNL at 174.  Etiology likely due to cirrhosis and splenomegaly which was noted on prior imaging with CT abdomen and pelvis done March 05, 2014.   Pt had EGD done with Dr. 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  Pt also had evidence of portal HTN.  Follow-up with GI as recommended.   3.  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  Pt had EGD done with Dr. 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  Pt also had evidence of portal HTN.  Follow-up with GI as recommended. CT of abdomen and pelvis done March 05, 2014 that showed evidence of cirrhosis with splenomegaly.  4.  Hypertension.  Blood pressure is 115/46.  Follow-up with PCP.  5.  Hypothyroidism.  Pt on Synthroid.  Follow-up with PCP for monitoring.   6.  Hernia.  Pt given option of surgical referral for evaluation if desired.  She will contemplate information and notify office if she desires to schedule.    Current Status: She is seen today for follow-up after IV iron.  She  is here to go over labs.    Problem List Patient Active Problem List   Diagnosis Date Noted  . LUQ fullness [R19.8] 07/25/2018  . CKD stage 3 due to type 2 diabetes mellitus ([place]) [Z66.29, N18.3] 05/24/2018  . Hyponatremia syndrome [E87.1] 05/24/2018  . Other dysphagia [R13.19] 09/15/2017  . Uncontrolled type 2 diabetes mellitus with complication ([place]) [U76.5, E11.65] 04/27/2017  . Mixed hyperlipidemia [E78.2] 04/27/2017  . Hyponatremia [E87.1] 04/27/2017  . Chronic diarrhea [K52.9] 12/15/2016  . Carpal tunnel syndrome, left [G56.02]   . Trigger middle finger of left hand [M65.332]   . Thrombocytopenia ([place]) [D69.6] 11/04/2016  . Iron deficiency anemia due to chronic blood loss [D50.0] 01/31/2016  . Other specified hypothyroidism [E03.8] 09/20/2015  . Essential hypertension, benign [I10] 07/12/2015  . Morbid obesity with BMI of 40.0-44.9, adult ([place]) [E66.01, Z68.41] 07/12/2015  . Hepatic cirrhosis ([place]) [K74.60] 04/02/2014  . Acute colitis [K52.9] 03/05/2014  . Abdominal pain, acute, left lower quadrant [R10.32] 03/05/2014  . Colitis [K52.9] 03/05/2014  . Splenomegaly, congestive, chronic [D73.2] 02/17/2014  . Sepsis ([place]) [A41.9] 02/15/2014  . Absolute anemia [D64.9] 02/15/2014  . Diabetes mellitus with stage 3 chronic kidney disease ([place]) [Y65.03, N18.3] 02/15/2014  . CHF (congestive heart failure) ([place]) [I50.9] 02/15/2014  . CKD (chronic kidney disease) [N18.9] 02/15/2014  . UTI (lower urinary tract infection) [N39.0] 02/15/2014    Past Medical History Past Medical History:  Diagnosis Date  . Acid reflux   .  Anemia   . Arthritis   . C. difficile colitis June/July 2015  . Cancer ([place])    skin cancer  . CHF (congestive heart failure) ([place])   . 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 Negative viral markers 2015  . Complication of anesthesia   . COPD (chronic obstructive pulmonary disease) ([place])   . Diabetes mellitus without complication ([place])   . GERD (gastroesophageal  reflux disease)   . History of kidney stones   . Hypercholesteremia   . Hypertension   . Hypothyroidism   . IBS (irritable bowel syndrome)   . Kidney disease   . Liver disease   . Neuropathy   . PONV (postoperative nausea and vomiting)   . Sleep apnea   . Thyroid disease     Past Surgical History Past Surgical History:  Procedure Laterality Date  . ABDOMINAL HYSTERECTOMY    . APPENDECTOMY    . CAROTID ARTERY - SUBCLAVIAN ARTERY BYPASS GRAFT Right   . CARPAL TUNNEL RELEASE Right   . CARPAL TUNNEL RELEASE Left 12/03/2016   Procedure: CARPAL TUNNEL RELEASE;  Surgeon: Carole Civil, MD;  Location: [facility];  Service: Orthopedics;  Laterality: Left;  . CATARACT EXTRACTION Bilateral   . CHOLECYSTECTOMY    . COLONOSCOPY N/A 02/19/2014   Dr. Gala Romney: rectal varices, rectal polyp overlying a varix non-manipulated  . ESOPHAGEAL BANDING N/A 08/15/2018   Procedure: ESOPHAGEAL BANDING;  Surgeon: Daneil Dolin, MD;  Location: [facility];  Service: Endoscopy;  Laterality: N/A;  . ESOPHAGOGASTRODUODENOSCOPY N/A 02/19/2014   Dr. Gala Romney: 4 columns of grade 2 esophageal varices, multiple gastric polyps with largest biopsied and hyperplastic. Negative H.pylori  . ESOPHAGOGASTRODUODENOSCOPY N/A 04/13/2018   Dr. Gala Romney: Grade 3 esophageal varices, no esophagitis, portal gastropathy, multiple hyperplastic appearing polyps  . ESOPHAGOGASTRODUODENOSCOPY (EGD) WITH PROPOFOL N/A 08/15/2018   Procedure: ESOPHAGOGASTRODUODENOSCOPY (EGD) WITH PROPOFOL;  Surgeon: Daneil Dolin, MD;  Location: [facility];  Service: Endoscopy;  Laterality: N/A;  1:45pm  . GIVENS CAPSULE STUDY N/A 05/04/2014   multiple polypoid-like lesions throughout small bowel, scattered superficial non-bleeding erosions more distal bowel, referred to [facility] for enteroscopy. Capsule study not complete, poor images.   Marland Kitchen TRIGGER FINGER RELEASE Left 12/03/2016   Procedure: RELEASE TRIGGER FINGER/A-1 PULLEY LEFT LONG TRIGGER FINGER RELEASE;   Surgeon: Carole Civil, MD;  Location: [facility];  Service: Orthopedics;  Laterality: Left;  . urosepsis      Family History Family History  Problem Relation Age of Onset  . Diabetes Mother   . Hypertension Mother   . Kidney failure Mother   . Blindness Mother   . Aneurysm Father   . Colon cancer Neg Hx      Social History  reports that she quit smoking about 15 years ago. She has a 70.00 pack-year smoking history. She has never used smokeless tobacco. She reports that she does not drink alcohol or use drugs.  Medications  Current Outpatient Medications:  .  amitriptyline (ELAVIL) 25 MG tablet, Take 25 mg by mouth at bedtime., Disp: , Rfl:  .  Azelastine HCl 137 MCG/SPRAY SOLN, Place 1 spray into both nostrils as needed (for congestion). , Disp: , Rfl: 2 .  benazepril (LOTENSIN) 20 MG tablet, Take 20 mg by mouth daily., Disp: , Rfl: 3 .  CRANBERRY PO, Take 15,000 mg by mouth at bedtime. , Disp: , Rfl:  .  diphenhydrAMINE (SOMINEX) 25 MG tablet, Take 50 mg by mouth at bedtime. , Disp: ,  Rfl:  .  diphenoxylate-atropine (LOMOTIL) 2.5-0.025 MG tablet, 1-2 tablets every 4-6 hours as needed for diarrhea. (Patient taking differently: Take 1-2 tablets by mouth 4 (four) times daily as needed for diarrhea or loose stools. 1-2 tablets every 4-6 hours as needed for diarrhea.), Disp: 120 tablet, Rfl: 1 .  donepezil (ARICEPT) 5 MG tablet, Take 5 mg by mouth at bedtime. , Disp: , Rfl:  .  ezetimibe (ZETIA) 10 MG tablet, Take 10 mg by mouth every morning. , Disp: , Rfl:  .  famotidine (PEPCID) 10 MG tablet, Take 10 mg by mouth at bedtime. , Disp: , Rfl:  .  furosemide (LASIX) 80 MG tablet, Take 80 mg by mouth every morning. , Disp: , Rfl:  .  gabapentin (NEURONTIN) 300 MG capsule, Take 300 mg by mouth at bedtime., Disp: , Rfl:  .  HUMULIN R U-500 KWIKPEN 500 UNIT/ML kwikpen, INJECT 130 UNITS S.Q. THREE TIMES DAILY WITH MEALS. (Patient taking differently: 100 Units 3 (three) times daily with meals.  ), Disp: 18 mL, Rfl: 0 .  lansoprazole (PREVACID) 30 MG capsule, Take 1 capsule (30 mg total) by mouth 2 (two) times daily before a meal. (Patient taking differently: Take 30 mg by mouth daily. ), Disp: 60 capsule, Rfl: 11 .  levothyroxine (SYNTHROID, LEVOTHROID) 100 MCG tablet, TAKE 1 TABLET BY MOUTH ONCE DAILY., Disp: 90 tablet, Rfl: 0 .  Multiple Vitamins-Minerals (CENTRUM SILVER ADULT 50+ PO), Take 1 tablet by mouth daily., Disp: , Rfl:  .  VICTOZA 18 MG/3ML SOPN, INJECT 1.8MG S.Q. ONCE DAILY, Disp: 9 mL, Rfl: 0 .  Vitamin D, Ergocalciferol, (DRISDOL) 50000 units CAPS capsule, Take 50,000 Units by mouth every 30 (thirty) days. , Disp: , Rfl: 1  Allergies Erythromycin; Niacin; Penicillins; Sulfa antibiotics; Ciprofloxacin; Statins; and Codeine  Review of Systems Review of Systems - Oncology ROS negative other than hernia concerns.     Physical Exam  Vitals Wt Readings from Last 3 Encounters:  09/12/18 219 lb 3.2 oz (99.4 kg)  08/10/18 218 lb 3.2 oz (99 kg)  08/10/18 218 lb (98.9 kg)   Temp Readings from Last 3 Encounters:  09/12/18 98.4 F (36.9 C) (Oral)  08/15/18 98.7 F (37.1 C)  08/10/18 (!) 97 F (36.1 C) (Oral)   BP Readings from Last 3 Encounters:  09/12/18 (!) 115/46  08/15/18 (!) 157/64  08/10/18 126/62   Pulse Readings from Last 3 Encounters:  09/12/18 81  08/15/18 86  08/10/18 82   Constitutional: Well-developed, well-nourished, and in no distress.   HENT: Head: Normocephalic and atraumatic.  Mouth/Throat: No oropharyngeal exudate. Mucosa moist. Eyes: Pupils are equal, round, and reactive to light. Conjunctivae are normal. No scleral icterus.  Neck: Normal range of motion. Neck supple. No JVD present.  Cardiovascular: Normal rate, regular rhythm and normal heart sounds.  Exam reveals no gallop and no friction rub.   No murmur heard. Pulmonary/Chest: Effort normal and breath sounds normal. No respiratory distress. No wheezes.No rales.  Abdominal: Soft.  Bowel sounds are normal. Obese.  Evidence of hernia.  Tender to palpation near hernia.  No guarding.   Musculoskeletal: No edema or tenderness.  Lymphadenopathy: No cervical, axillary or supraclavicular adenopathy.  Neurological: Alert and oriented to person, place, and time. No cranial nerve deficit.  Skin: Skin is warm and dry. No rash noted. No erythema. No pallor.  Psychiatric: Affect and judgment normal.   Labs Appointment on 09/12/2018  Component Date Value Ref Range Status  . WBC 09/12/2018 4.4  4.0 - 10.5 K/uL Final  . RBC 09/12/2018 4.30  3.87 - 5.11 MIL/uL Final  . Hemoglobin 09/12/2018 12.5  12.0 - 15.0 g/dL Final  . HCT 09/12/2018 40.1  36.0 - 46.0 % Final  . MCV 09/12/2018 93.3  80.0 - 100.0 fL Final  . MCH 09/12/2018 29.1  26.0 - 34.0 pg Final  . MCHC 09/12/2018 31.2  30.0 - 36.0 g/dL Final  . RDW 09/12/2018 16.8* 11.5 - 15.5 % Final  . Platelets 09/12/2018 74* 150 - 400 K/uL Final   Comment: PLATELET COUNT CONFIRMED BY SMEAR SPECIMEN CHECKED FOR CLOTS Immature Platelet Fraction may be clinically indicated, consider ordering this additional test [id number]   . nRBC 09/12/2018 0.0  0.0 - 0.2 % Final  . Neutrophils Relative % 09/12/2018 67  % Final  . Neutro Abs 09/12/2018 3.0  1.7 - 7.7 K/uL Final  . Lymphocytes Relative 09/12/2018 20  % Final  . Lymphs Abs 09/12/2018 0.9  0.7 - 4.0 K/uL Final  . Monocytes Relative 09/12/2018 9  % Final  . Monocytes Absolute 09/12/2018 0.4  0.1 - 1.0 K/uL Final  . Eosinophils Relative 09/12/2018 2  % Final  . Eosinophils Absolute 09/12/2018 0.1  0.0 - 0.5 K/uL Final  . Basophils Relative 09/12/2018 1  % Final  . Basophils Absolute 09/12/2018 0.0  0.0 - 0.1 K/uL Final  . Immature Granulocytes 09/12/2018 1  % Final  . Abs Immature Granulocytes 09/12/2018 0.02  0.00 - 0.07 K/uL Final   Performed at [facility], [address] [place], [place] [address]  . Sodium 09/12/2018 136  135 - 145 mmol/L Final  . Potassium 09/12/2018 3.8  3.5  - 5.1 mmol/L Final  . Chloride 09/12/2018 100  98 - 111 mmol/L Final  . CO2 09/12/2018 29  22 - 32 mmol/L Final  . Glucose, Bld 09/12/2018 232* 70 - 99 mg/dL Final  . BUN 09/12/2018 13  8 - 23 mg/dL Final  . Creatinine, Ser 09/12/2018 0.97  0.44 - 1.00 mg/dL Final  . Calcium 09/12/2018 8.6* 8.9 - 10.3 mg/dL Final  . Total Protein 09/12/2018 6.7  6.5 - 8.1 g/dL Final  . Albumin 09/12/2018 3.5  3.5 - 5.0 g/dL Final  . AST 09/12/2018 32  15 - 41 U/L Final  . ALT 09/12/2018 17  0 - 44 U/L Final  . Alkaline Phosphatase 09/12/2018 112  38 - 126 U/L Final  . Total Bilirubin 09/12/2018 0.6  0.3 - 1.2 mg/dL Final  . GFR calc non Af Amer 09/12/2018 >60  >60 mL/min Final  . GFR calc Af Amer 09/12/2018 >60  >60 mL/min Final  . Anion gap 09/12/2018 7  5 - 15 Final   Performed at [facility], [address] [place], [place] [address]  . LDH 09/12/2018 174  98 - 192 U/L Final   Performed at [facility], [address] [place], [place] [address]  . Ferritin 09/12/2018 75  11 - 307 ng/mL Final   Performed at [facility], [address], [place], [place] [address]     Pathology Orders Placed This Encounter  Procedures  . CBC with Differential/Platelet    Standing Status:   Future    Standing Expiration Date:   09/13/2019  . Comprehensive metabolic panel    Standing Status:   Future    Standing Expiration Date:   09/13/2019  . Lactate dehydrogenase    Standing Status:   Future    Standing Expiration Date:   09/13/2019  .  Ferritin    Standing Status:   Future    Standing Expiration Date:   09/13/2019       Zoila Shutter MD

## 2018-09-13 NOTE — Progress Notes (Signed)
Pt currently using Freestyle Libre to monitor BG levels.

## 2018-09-15 ENCOUNTER — Ambulatory Visit (HOSPITAL_COMMUNITY): Payer: Medicare Other | Admitting: Anesthesiology

## 2018-09-15 ENCOUNTER — Other Ambulatory Visit: Payer: Self-pay

## 2018-09-15 ENCOUNTER — Encounter (HOSPITAL_COMMUNITY)
Admission: RE | Disposition: A | Discharge status: Home / Self Care | Payer: Self-pay | Source: Home / Self Care | Attending: Internal Medicine

## 2018-09-15 ENCOUNTER — Encounter (HOSPITAL_COMMUNITY): Payer: Self-pay | Admitting: *Deleted

## 2018-09-15 ENCOUNTER — Ambulatory Visit (HOSPITAL_COMMUNITY)
Admission: RE | Admit: 2018-09-15 | Discharge: 2018-09-15 | Disposition: A | Discharge status: Home / Self Care | Payer: Medicare Other | Attending: Internal Medicine | Admitting: Internal Medicine

## 2018-09-15 DIAGNOSIS — Z9049 Acquired absence of other specified parts of digestive tract: Secondary | ICD-10-CM | POA: Diagnosis not present

## 2018-09-15 DIAGNOSIS — I110 Hypertensive heart disease with heart failure: Secondary | ICD-10-CM | POA: Insufficient documentation

## 2018-09-15 DIAGNOSIS — E1140 Type 2 diabetes mellitus with diabetic neuropathy, unspecified: Secondary | ICD-10-CM | POA: Diagnosis not present

## 2018-09-15 DIAGNOSIS — E7800 Pure hypercholesterolemia, unspecified: Secondary | ICD-10-CM | POA: Diagnosis not present

## 2018-09-15 DIAGNOSIS — G4730 Sleep apnea, unspecified: Secondary | ICD-10-CM | POA: Insufficient documentation

## 2018-09-15 DIAGNOSIS — Z87891 Personal history of nicotine dependence: Secondary | ICD-10-CM | POA: Insufficient documentation

## 2018-09-15 DIAGNOSIS — K7460 Unspecified cirrhosis of liver: Secondary | ICD-10-CM | POA: Insufficient documentation

## 2018-09-15 DIAGNOSIS — Z79899 Other long term (current) drug therapy: Secondary | ICD-10-CM | POA: Diagnosis not present

## 2018-09-15 DIAGNOSIS — K3189 Other diseases of stomach and duodenum: Secondary | ICD-10-CM | POA: Insufficient documentation

## 2018-09-15 DIAGNOSIS — Z6841 Body Mass Index (BMI) 40.0 and over, adult: Secondary | ICD-10-CM | POA: Insufficient documentation

## 2018-09-15 DIAGNOSIS — E1122 Type 2 diabetes mellitus with diabetic chronic kidney disease: Secondary | ICD-10-CM | POA: Diagnosis not present

## 2018-09-15 DIAGNOSIS — I8510 Secondary esophageal varices without bleeding: Secondary | ICD-10-CM | POA: Insufficient documentation

## 2018-09-15 DIAGNOSIS — K219 Gastro-esophageal reflux disease without esophagitis: Secondary | ICD-10-CM | POA: Insufficient documentation

## 2018-09-15 DIAGNOSIS — N183 Chronic kidney disease, stage 3 (moderate): Secondary | ICD-10-CM | POA: Diagnosis not present

## 2018-09-15 DIAGNOSIS — K7581 Nonalcoholic steatohepatitis (NASH): Secondary | ICD-10-CM | POA: Diagnosis not present

## 2018-09-15 DIAGNOSIS — I509 Heart failure, unspecified: Secondary | ICD-10-CM | POA: Insufficient documentation

## 2018-09-15 DIAGNOSIS — E039 Hypothyroidism, unspecified: Secondary | ICD-10-CM | POA: Insufficient documentation

## 2018-09-15 DIAGNOSIS — Z79890 Hormone replacement therapy (postmenopausal): Secondary | ICD-10-CM | POA: Insufficient documentation

## 2018-09-15 DIAGNOSIS — K766 Portal hypertension: Secondary | ICD-10-CM | POA: Insufficient documentation

## 2018-09-15 DIAGNOSIS — Z794 Long term (current) use of insulin: Secondary | ICD-10-CM | POA: Insufficient documentation

## 2018-09-15 DIAGNOSIS — Z09 Encounter for follow-up examination after completed treatment for conditions other than malignant neoplasm: Secondary | ICD-10-CM | POA: Diagnosis present

## 2018-09-15 DIAGNOSIS — Z85828 Personal history of other malignant neoplasm of skin: Secondary | ICD-10-CM | POA: Insufficient documentation

## 2018-09-15 DIAGNOSIS — I8511 Secondary esophageal varices with bleeding: Secondary | ICD-10-CM

## 2018-09-15 DIAGNOSIS — J449 Chronic obstructive pulmonary disease, unspecified: Secondary | ICD-10-CM | POA: Insufficient documentation

## 2018-09-15 HISTORY — PX: ESOPHAGEAL BANDING: SHX5518

## 2018-09-15 HISTORY — PX: ESOPHAGOGASTRODUODENOSCOPY (EGD) WITH PROPOFOL: SHX5813

## 2018-09-15 LAB — GLUCOSE, CAPILLARY
Glucose-Capillary: 208 mg/dL — ABNORMAL HIGH (ref 70–99)
Glucose-Capillary: 187 mg/dL — ABNORMAL HIGH (ref 70–99)

## 2018-09-15 SURGERY — ESOPHAGOGASTRODUODENOSCOPY (EGD) WITH PROPOFOL
Anesthesia: Monitor Anesthesia Care

## 2018-09-15 MED ORDER — LACTATED RINGERS IV SOLN
INTRAVENOUS | Status: DC
  Administered 2018-09-15: 13:00:00 via INTRAVENOUS

## 2018-09-15 MED ORDER — HYDROMORPHONE HCL 1 MG/ML IJ SOLN
0.2500 mg | INTRAMUSCULAR | Status: DC | PRN
  Administered 2018-09-15: 0.25 mg via INTRAVENOUS

## 2018-09-15 MED ORDER — STERILE WATER FOR IRRIGATION IR SOLN
Status: DC | PRN
  Administered 2018-09-15: 100 mL

## 2018-09-15 MED ORDER — HYDROMORPHONE HCL 1 MG/ML IJ SOLN
INTRAMUSCULAR | Status: AC
  Filled 2018-09-15: qty 0.5

## 2018-09-15 MED ORDER — PROMETHAZINE HCL 25 MG/ML IJ SOLN: 6.2500 mg | INTRAMUSCULAR | Status: DC | PRN

## 2018-09-15 MED ORDER — MEPERIDINE HCL 100 MG/ML IJ SOLN: 6.2500 mg | INTRAMUSCULAR | Status: DC | PRN

## 2018-09-15 MED ORDER — HYDROCODONE-ACETAMINOPHEN 7.5-325 MG PO TABS: 1.0000 | ORAL_TABLET | Freq: Once | ORAL | Status: DC | PRN

## 2018-09-15 MED ORDER — ONDANSETRON HCL 4 MG/2ML IJ SOLN
INTRAMUSCULAR | Status: DC | PRN
  Administered 2018-09-15: 4 mg via INTRAVENOUS

## 2018-09-15 MED ORDER — ONDANSETRON HCL 4 MG/2ML IJ SOLN
INTRAMUSCULAR | Status: AC
  Filled 2018-09-15: qty 2

## 2018-09-15 MED ORDER — LACTATED RINGERS IV SOLN: INTRAVENOUS | Status: DC

## 2018-09-15 MED ORDER — PROPOFOL 10 MG/ML IV BOLUS
INTRAVENOUS | Status: DC | PRN
  Administered 2018-09-15 (×2): 40 mg via INTRAVENOUS
  Administered 2018-09-15: 20 mg via INTRAVENOUS
  Administered 2018-09-15: 60 mg via INTRAVENOUS

## 2018-09-15 MED ORDER — PROPOFOL 500 MG/50ML IV EMUL
INTRAVENOUS | Status: DC | PRN
  Administered 2018-09-15: 75 ug/kg/min via INTRAVENOUS
  Administered 2018-09-15: 150 ug/kg/min via INTRAVENOUS

## 2018-09-15 NOTE — Anesthesia Preprocedure Evaluation (Signed)
Anesthesia Evaluation    History of Anesthesia Complications (+) history of anesthetic complications  Airway Mallampati: II       Dental  (+) Teeth Intact, Caps, Implants   Pulmonary sleep apnea , COPD, former smoker,     + decreased breath sounds      Cardiovascular hypertension, +CHF   Rhythm:regular     Neuro/Psych    GI/Hepatic GERD  ,  Endo/Other  diabetesHypothyroidism   Renal/GU CRF     Musculoskeletal   Abdominal   Peds  Hematology  (+) Blood dyscrasia, anemia ,   Anesthesia Other Findings Morbid obesity Esophageal varicies, fatty liver ds, portal HTN w/NASH COPD DM CKD stage 3 due to DM  Reproductive/Obstetrics                             Anesthesia Physical Anesthesia Plan  ASA: IV  Anesthesia Plan: MAC   Post-op Pain Management:    Induction:   PONV Risk Score and Plan:   Airway Management Planned:   Additional Equipment:   Intra-op Plan:   Post-operative Plan:   Informed Consent: I have reviewed the patients History and Physical, chart, labs and discussed the procedure including the risks, benefits and alternatives for the proposed anesthesia with the patient or authorized representative who has indicated his/her understanding and acceptance.     Plan Discussed with: Anesthesiologist  Anesthesia Plan Comments:         Anesthesia Quick Evaluation

## 2018-09-15 NOTE — H&P (Signed)
@LOGO @   Primary Care Physician:  Penny Schwalbe, MD Primary Gastroenterologist:  Dr. Gala Martinez  Pre-Procedure History & Physical: HPI:  Penny Martinez is a 68 y.o. female here for follow-up retreatment esophageal varices.  History of Penny Martinez.  Did well with banding prior session a few weeks ago.  From January 6, white count 4.4 hemoglobin 12.5 platelet count 74,000.  Past Medical History:  Diagnosis Date  . Acid reflux   . Anemia   . Arthritis   . C. difficile colitis June/July 2015  . Cancer ([place])    skin cancer  . CHF (congestive heart failure) ([place])   . 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 Negative viral markers 2015  . Complication of anesthesia   . COPD (chronic obstructive pulmonary disease) ([place])   . Diabetes mellitus without complication ([place])   . GERD (gastroesophageal reflux disease)   . History of kidney stones   . Hypercholesteremia   . Hypertension   . Hypothyroidism   . IBS (irritable bowel syndrome)   . Kidney disease   . Liver disease   . Neuropathy   . PONV (postoperative nausea and vomiting)   . Sleep apnea   . Thyroid disease     Past Surgical History:  Procedure Laterality Date  . ABDOMINAL HYSTERECTOMY    . APPENDECTOMY    . CAROTID ARTERY - SUBCLAVIAN ARTERY BYPASS GRAFT Right   . CARPAL TUNNEL RELEASE Right   . CARPAL TUNNEL RELEASE Left 12/03/2016   Procedure: CARPAL TUNNEL RELEASE;  Surgeon: Penny Civil, MD;  Location: [facility];  Service: Orthopedics;  Laterality: Left;  . CATARACT EXTRACTION Bilateral   . CHOLECYSTECTOMY    . COLONOSCOPY N/A 02/19/2014   Dr. Gala Martinez: rectal varices, rectal polyp overlying a varix non-manipulated  . ESOPHAGEAL BANDING N/A 08/15/2018   Procedure: ESOPHAGEAL BANDING;  Surgeon: Daneil Dolin, MD;  [facility];  Service: Endoscopy;  Laterality: N/A;  . ESOPHAGOGASTRODUODENOSCOPY N/A 02/19/2014   Dr. Gala Martinez: 4 columns of grade 2 esophageal varices, multiple gastric polyps with largest biopsied and  hyperplastic. Negative H.pylori  . ESOPHAGOGASTRODUODENOSCOPY N/A 04/13/2018   Dr. Gala Martinez: Grade 3 esophageal varices, no esophagitis, portal gastropathy, multiple hyperplastic appearing polyps  . ESOPHAGOGASTRODUODENOSCOPY (EGD) WITH PROPOFOL N/A 08/15/2018   Procedure: ESOPHAGOGASTRODUODENOSCOPY (EGD) WITH PROPOFOL;  Surgeon: Daneil Dolin, MD;  [facility];  Service: Endoscopy;  Laterality: N/A;  1:45pm  . GIVENS CAPSULE STUDY N/A 05/04/2014   multiple polypoid-like lesions throughout small bowel, scattered superficial non-bleeding erosions more distal bowel, referred to [facility] for enteroscopy. Capsule study not complete, poor images.   Penny Martinez TRIGGER FINGER RELEASE Left 12/03/2016   Procedure: RELEASE TRIGGER FINGER/A-1 PULLEY LEFT LONG TRIGGER FINGER RELEASE;  Surgeon: Penny Civil, MD;  Location: [facility];  Service: Orthopedics;  Laterality: Left;  . urosepsis      Prior to Admission medications   Medication Sig Start Date End Date Taking? Authorizing Provider  amitriptyline (ELAVIL) 25 MG tablet Take 25 mg by mouth at bedtime.   Yes [provider]  benazepril (LOTENSIN) 20 MG tablet Take 20 mg by mouth daily. 07/08/18  Yes [provider]  CRANBERRY PO Take 15,000 mg by mouth at bedtime.    Yes [provider]  diphenhydrAMINE (SOMINEX) 25 MG tablet Take 50 mg by mouth at bedtime.    Yes [provider]  donepezil (ARICEPT) 5 MG tablet Take 5 mg by mouth at bedtime.  01/12/18  Yes [provider]  ezetimibe (ZETIA) 10 MG tablet Take 10 mg by mouth every morning.    Yes [provider]  famotidine (PEPCID) 10 MG tablet Take 10 mg by mouth at bedtime.    Yes [provider]  furosemide (LASIX) 80 MG tablet Take 80 mg by mouth every morning.    Yes [provider]  gabapentin (NEURONTIN) 300 MG capsule Take 300 mg by mouth at bedtime.   Yes [provider]  HUMULIN R U-500 KWIKPEN 500 UNIT/ML kwikpen  INJECT 130 UNITS S.Q. THREE TIMES DAILY WITH MEALS. Patient taking differently: 100 Units 3 (three) times daily with meals.  08/24/18  Yes Penny, Marella Chimes, MD  lansoprazole (PREVACID) 30 MG capsule Take 1 capsule (30 mg total) by mouth 2 (two) times daily before a meal. Patient taking differently: Take 30 mg by mouth daily.  07/13/18  Yes Penny Menghini, PA-C  levothyroxine (SYNTHROID, LEVOTHROID) 100 MCG tablet TAKE 1 TABLET BY MOUTH ONCE DAILY. 09/06/18  Yes Penny, Marella Chimes, MD  Multiple Vitamins-Minerals (CENTRUM SILVER ADULT 50+ PO) Take 1 tablet by mouth daily.   Yes [provider]  VICTOZA 18 MG/3ML SOPN INJECT 1.8MG S.Q. ONCE DAILY 09/06/18  Yes Penny, Marella Chimes, MD  Vitamin D, Ergocalciferol, (DRISDOL) 50000 units CAPS capsule Take 50,000 Units by mouth every 30 (thirty) days.  01/20/18  Yes [provider]  Azelastine HCl 137 MCG/SPRAY SOLN Place 1 spray into both nostrils as needed (for congestion).  03/21/18   [provider]  diphenoxylate-atropine (LOMOTIL) 2.5-0.025 MG tablet 1-2 tablets every 4-6 hours as needed for diarrhea. Patient taking differently: Take 1-2 tablets by mouth 4 (four) times daily as needed for diarrhea or loose stools. 1-2 tablets every 4-6 hours as needed for diarrhea. 07/25/18   Penny Needs, NP    Allergies as of 08/16/2018 - Review Complete 08/15/2018  Allergen Reaction Noted  . Erythromycin Hives 02/15/2014  . Niacin Rash and Shortness Of Breath 03/05/2014  . Penicillins Shortness Of Breath and Other (See Comments) 02/15/2014  . Sulfa antibiotics Hives 02/15/2014  . Ciprofloxacin Itching and Nausea And Vomiting 05/23/2018  . Statins Other (See Comments) 03/05/2014  . Codeine Itching 11/26/2016    Family History  Problem Relation Age of Onset  . Diabetes Mother   . Hypertension Mother   . Kidney failure Mother   . Blindness Mother   . Aneurysm Father   . Colon cancer Neg Hx     Social History    Socioeconomic History  . Marital status: Married    Spouse name: Not on file  . Number of children: Not on file  . Years of education: Not on file  . Highest education level: Not on file  Occupational History  . Not on file  Social Martinez  . Financial resource strain: Not on file  . Food insecurity:    Worry: Not on file    Inability: Not on file  . Transportation Martinez:    Medical: Not on file    Non-medical: Not on file  Tobacco Use  . Smoking status: Former Smoker    Packs/day: 2.00    Years: 35.00    Pack years: 70.00    Last attempt to quit: 05/05/2003    Years since quitting: 15.3  . Smokeless tobacco: Never Used  . Tobacco comment: Quit smoking 11 years ago  Substance and Sexual Activity  . Alcohol use: No  . Drug use: No  . Sexual activity: Not  Currently    Birth control/protection: Surgical  Lifestyle  . Physical activity:    Days per week: Not on file    Minutes per session: Not on file  . Stress: Not on file  Relationships  . Social connections:    Talks on phone: Not on file    Gets together: Not on file    Attends religious service: Not on file    Active member of club or organization: Not on file    Attends meetings of clubs or organizations: Not on file    Relationship status: Not on file  . Intimate partner violence:    Fear of current or ex partner: Not on file    Emotionally abused: Not on file    Physically abused: Not on file    Forced sexual activity: Not on file  Other Topics Concern  . Not on file  Social History Narrative  . Not on file    Review of Systems: See HPI, otherwise negative ROS  Physical Exam: BP 139/60   Pulse 85   Temp 98 F (36.7 C) (Oral)   Resp 19   Ht 5' 1"  (1.549 m)   Wt 99.4 kg   SpO2 96%   BMI 41.41 kg/m  General:   Alert,  Well-developed, well-nourished, pleasant and cooperative in NAD Skin:  Intact without significant lesions or rashes. Eyes:  Sclera clear, no icterus.   Conjunctiva pink. Ears:   Normal auditory acuity. Nose:  No deformity, discharge,  or lesions. Mouth:  No deformity or lesions. Neck:  Supple; no masses or thyromegaly. No significant cervical adenopathy. Lungs:  Clear throughout to auscultation.   No wheezes, crackles, or rhonchi. No acute distress. Heart:  Regular rate and rhythm; no murmurs, clicks, rubs,  or gallops. Abdomen: Non-distended, normal bowel sounds.  Soft and nontender without appreciable mass or hepatosplenomegaly.  Pulses:  Normal pulses noted. Extremities:  Without clubbing or edema.  Impression/Plan: History of [place]/cirrhosis known esophageal varices.  Status post EVL previously.  Here for follow-up and retreatment as appropriate.  The risks, benefits, limitations, alternatives and imponderables have been reviewed with the patient. Potential for esophageal dilation, biopsy, etc. have also been reviewed.  Questions have been answered. All parties agreeable.    Notice: This dictation was prepared with Dragon dictation along with smaller phrase technology. Any transcriptional errors that result from this process are unintentional and may not be corrected upon review.

## 2018-09-15 NOTE — Discharge Instructions (Signed)
EGD Discharge instructions Please read the instructions outlined below and refer to this sheet in the next few weeks. These discharge instructions provide you with general information on caring for yourself after you leave the hospital. Your doctor may also give you specific instructions. While your treatment has been planned according to the most current medical practices available, unavoidable complications occasionally occur. If you have any problems or questions after discharge, please call your doctor. ACTIVITY  You may resume your regular activity but move at a slower pace for the next 24 hours.   Take frequent rest periods for the next 24 hours.   Walking will help expel (get rid of) the air and reduce the bloated feeling in your abdomen.   No driving for 24 hours (because of the anesthesia (medicine) used during the test).   You may shower.   Do not sign any important legal documents or operate any machinery for 24 hours (because of the anesthesia used during the test).  NUTRITION  Drink plenty of fluids.   You may resume your normal diet.   Begin with a light meal and progress to your normal diet.   Avoid alcoholic beverages for 24 hours or as instructed by your caregiver.  MEDICATIONS  You may resume your normal medications unless your caregiver tells you otherwise.  WHAT YOU CAN EXPECT TODAY  You may experience abdominal discomfort such as a feeling of fullness or gas pains.  FOLLOW-UP  Your doctor will discuss the results of your test with you.  SEEK IMMEDIATE MEDICAL ATTENTION IF ANY OF THE FOLLOWING OCCUR:  Excessive nausea (feeling sick to your stomach) and/or vomiting.   Severe abdominal pain and distention (swelling).   Trouble swallowing.   Temperature over 101 F (37.8 C).   Rectal bleeding or vomiting of blood.   Clear liquid diet today; Advance to soft and as tolerated tomorrow  Use Chloraseptic spray for any transient sore throat or chest pain  you may experience  Office visit with [place] in 4 months     Clear Liquid Diet, Adult A clear liquid diet is a diet that includes only liquids and semi-liquids that you can see through. You do not eat any food on this diet. Most people need to follow this diet for only a short time. You may need to follow a clear liquid diet if:  You have a problem right before or after you have surgery.  You did not eat food for a long time.  You had any of these: ? Feeling sick to your stomach (nausea). ? Throwing up (vomiting). ? Passing a watery stool (diarrhea).  You are going to have an exam to look at parts of your digestive system.  You are going to have bowel surgery. The goals of this diet are:  To rest the stomach.  To help you clear the digestive system before an exam.  To make sure that there is enough fluid in your body.  To make sure you get some energy.  To help you get back to eating like you used to. What are tips for following this plan?  A clear liquid is a liquid or semi-liquid that you can see through when you hold it up to a light. An example of this is gelatin.  This diet does not give you all the nutrients that you need. Choose a variety of the liquids that your doctor says you can drink on this diet. That way, you will get as many nutrients as  possible.  If you are not sure whether you can have certain items, ask your doctor. If you are unable to swallow a thin liquid, you will need to thicken it before taking it. This will stop you from breathing it in (aspiration). What foods should I eat?   Water and flavored water.  Fruit juices that do not have pulp, such as cranberry juice and apple juice.  Tea and coffee without milk or cream.  Clear bouillon or broth.  Broth-based soups that have been strained.  Flavored gelatins.  Honey.  Sugar water.  Ice or frozen ice pops that do not have any milk, yogurt, fruit pieces, or fruit pulp in them.  Clear  sodas.  Clear sports drinks. The items listed above may not be a complete list of what you can eat and drink. Contact a dietitian for more options. What foods should I avoid?  Juices that have pulp.  Milk.  Cream or cream-based soups.  Yogurt.  Normal foods that are not clear liquids or semi-liquids. The items listed above may not be a complete list of what you should not eat and drink. Contact a dietitian for options. Questions to ask your health care provider  How long do I need to follow this diet?  Are there any medicines that I should change while on this diet? Summary  A clear liquid diet is a diet that includes only liquids and semi-liquids that you can see through.  Some goals of this diet are to rest your stomach, make sure you get enough fluid, and give you some energy.  Avoid liquids with milk, cream, or pulp while you are on this diet. This information is not intended to replace advice given to you by your health care provider. Make sure you discuss any questions you have with your health care provider. Document Released: 08/06/2008 Document Revised: 02/14/2018 Document Reviewed: 02/14/2018 Elsevier Interactive Patient Education  2019 [place] A soft-food eating plan includes foods that are safe and easy to chew and swallow. Your health care provider or dietitian can help you find foods and flavors that fit into this plan. Follow this plan until your health care provider or dietitian says it is safe to start eating other foods and food textures. What are tips for following this plan? General guidelines   Take small bites of food, or cut food into pieces about  inch or smaller. Bite-sized pieces of food are easier to chew and swallow.  Eat moist foods. Avoid overly dry foods.  Avoid foods that: ? Are difficult to swallow, such as dry, chunky, crispy, or sticky foods. ? Are difficult to chew, such as hard, tough, or stringy  foods. ? Contain nuts, seeds, or fruits.  Follow instructions from your dietitian about the types of liquids that are safe for you to swallow. You may be allowed to have: ? Thick liquids only. This includes only liquids that are thicker than honey. ? Thin and thick liquids. This includes all beverages and foods that become liquid at room temperature.  To make thick liquids: ? Purchase a commercial liquid thickening powder. These are available at grocery stores and pharmacies. ? Mix the thickener into liquids according to instructions on the label. ? Purchase ready-made thickened liquids. ? Thicken soup by pureeing, straining to remove chunks, and adding flour, potato flakes, or corn starch. ? Add commercial thickener to foods that become liquid at room temperature, such as milk shakes, yogurt,  ice cream, gelatin, and sherbet.  Ask your health care provider whether you need to take a fiber supplement. Cooking  Cook meats so they stay tender and moist. Use methods like braising, stewing, or baking in liquid.  Cook vegetables and fruit until they are soft enough to be mashed with a fork.  Peel soft, fresh fruits such as peaches, nectarines, and melons.  When making soup, make sure chunks of meat and vegetables are smaller than  inch.  Reheat leftover foods slowly so that a tough crust does not form. What foods are allowed? The items listed below may not be a complete list. Talk with your dietitian about what dietary choices are best for you. Grains Breads, muffins, pancakes, or waffles moistened with syrup, jelly, or butter. Dry cereals well-moistened with milk. Moist, cooked cereals. Well-cooked pasta and rice. Vegetables All soft-cooked vegetables. Shredded lettuce. Fruits All canned and cooked fruits. Soft, peeled fresh fruits. Strawberries. Dairy Milk. Cream. Yogurt. Cottage cheese. Soft cheese without the rind. Meats and other protein foods Tender, moist ground meat, poultry,  or fish. Meat cooked in gravy or sauces. Eggs. Sweets and desserts Ice cream. Milk shakes. Sherbet. Pudding. Fats and oils Butter. Margarine. Olive, canola, sunflower, and grapeseed oil. Smooth salad dressing. Smooth cream cheese. Mayonnaise. Gravy. What foods are not allowed? The items listed bemay not be a complete list. Talk with your dietitian about what dietary choices are best for you. Grains Coarse or dry cereals, such as bran, granola, and shredded wheat. Tough or chewy crusty breads, such as [place] bread or baguettes. Breads with nuts, seeds, or fruit. Vegetables All raw vegetables. Cooked corn. Cooked vegetables that are tough or stringy. Tough, crisp, fried potatoes and potato skins. Fruits Fresh fruits with skins or seeds, or both, such as apples, pears, and grapes. Stringy, high-pulp fruits, such as papaya, pineapple, coconut, and mango. Fruit leather and all dried fruit. Dairy Yogurt with nuts or coconut. Meats and other protein foods Hard, dry sausages. Dry meat, poultry, or fish. Meats with gristle. Fish with bones. Fried meat or fish. Lunch meat and hotdogs. Nuts and seeds. Chunky peanut butter or other nut butters. Sweets and desserts Cakes or cookies that are very dry or chewy. Desserts with dried fruit, nuts, or coconut. Fried pastries. Very rich pastries. Fats and oils Cream cheese with fruit or nuts. Salad dressings with seeds or chunks. Summary  A soft-food eating plan includes foods that are safe and easy to swallow. Generally, the foods should be soft enough to be mashed with a fork.  Avoid foods that are dry, hard to chew, crunchy, sticky, stringy, or crispy.  Ask your health care provider whether you need to thicken your liquids and if you need to take a fiber supplement. This information is not intended to replace advice given to you by your health care provider. Make sure you discuss any questions you have with your health care provider. Document Released:  12/01/2007 Document Revised: 10/27/2016 Document Reviewed: 10/27/2016 Elsevier Interactive Patient Education  2019 [place], Care After These instructions provide you with information about caring for yourself after your procedure. Your health care provider may also give you more specific instructions. Your treatment has been planned according to current medical practices, but problems sometimes occur. Call your health care provider if you have any problems or questions after your procedure. What can I expect after the procedure? After your procedure, you may:  Feel sleepy for several hours.  Feel clumsy and have poor balance for several hours.  Feel forgetful about what happened after the procedure.  Have poor judgment for several hours.  Feel nauseous or vomit.  Have a sore throat if you had a breathing tube during the procedure. Follow these instructions at home: For at least 24 hours after the procedure:      Have a responsible adult stay with you. It is important to have someone help care for you until you are awake and alert.  Rest as needed.  Do not: ? Participate in activities in which you could fall or become injured. ? Drive. ? Use heavy machinery. ? Drink alcohol. ? Take sleeping pills or medicines that cause drowsiness. ? Make important decisions or sign legal documents. ? Take care of children on your own. Eating and drinking  Follow the diet that is recommended by your health care provider.  If you vomit, drink water, juice, or soup when you can drink without vomiting.  Make sure you have little or no nausea before eating solid foods. General instructions  Take over-the-counter and prescription medicines only as told by your health care provider.  If you have sleep apnea, surgery and certain medicines can increase your risk for breathing problems. Follow instructions from your health care provider about wearing your  sleep device: ? Anytime you are sleeping, including during daytime naps. ? While taking prescription pain medicines, sleeping medicines, or medicines that make you drowsy.  If you smoke, do not smoke without supervision.  Keep all follow-up visits as told by your health care provider. This is important. Contact a health care provider if:  You keep feeling nauseous or you keep vomiting.  You feel light-headed.  You develop a rash.  You have a fever. Get help right away if:  You have trouble breathing. Summary  For several hours after your procedure, you may feel sleepy and have poor judgment.  Have a responsible adult stay with you for at least 24 hours or until you are awake and alert. This information is not intended to replace advice given to you by your health care provider. Make sure you discuss any questions you have with your health care provider. Document Released: 12/15/2015 Document Revised: 04/09/2017 Document Reviewed: 12/15/2015 Elsevier Interactive Patient Education  2019 [employer].

## 2018-09-15 NOTE — Addendum Note (Signed)
Addendum  created 09/15/18 1503 by Vista Deck, CRNA   Intraprocedure Event edited, Intraprocedure Meds edited

## 2018-09-15 NOTE — Transfer of Care (Signed)
Immediate Anesthesia Transfer of Care Note  Patient: Penny Martinez  Procedure(s) Performed: ESOPHAGOGASTRODUODENOSCOPY (EGD) WITH PROPOFOL (N/A ) ESOPHAGEAL BANDING (N/A )  Patient Location: PACU  Anesthesia Type:MAC  Level of Consciousness: awake and patient cooperative  Airway & Oxygen Therapy: Patient Spontanous Breathing  Post-op Assessment: Report given to RN and Post -op Vital signs reviewed and stable  Post vital signs: Reviewed and stable  Last Vitals:  Vitals Value Taken Time  BP    Temp    Pulse 95 09/15/2018  2:40 PM  Resp 19 09/15/2018  2:40 PM  SpO2 97 % 09/15/2018  2:40 PM  Vitals shown include unvalidated device data.  Last Pain:  Vitals:   09/15/18 1433  TempSrc:   PainSc: 0-No pain      Patients Stated Pain Goal: 5 (09/16/01 4961)  Complications: No apparent anesthesia complications

## 2018-09-15 NOTE — Op Note (Signed)
[facility] Patient Name: Penny Martinez Procedure Date: 09/15/2018 12:10 PM MRN: [id number] Date of Birth: [birth year] Attending MD: Gennette Pac , MD CSN: [id number] Age: 68 Admit Type: Outpatient Procedure:                Upper GI endoscopy Indications:              Surveillance procedure Providers:                Gennette Pac, MD, Edrick Kins, RN, Edythe Clarity, Technician Referring MD:              Medicines:                Propofol per Anesthesia Complications:            No immediate complications. Estimated Blood Loss:     Estimated blood loss was minimal. Procedure:                Pre-Anesthesia Assessment:                           - Prior to the procedure, a History and Physical                            was performed, and patient medications and                            allergies were reviewed. The patient's tolerance of                            previous anesthesia was also reviewed. The risks                            and benefits of the procedure and the sedation                            options and risks were discussed with the patient.                            All questions were answered, and informed consent                            was obtained. Prior Anticoagulants: The patient has                            taken no previous anticoagulant or antiplatelet                            agents. ASA Grade Assessment: III - A patient with                            severe systemic disease. After reviewing the risks  and benefits, the patient was deemed in                            satisfactory condition to undergo the procedure.                           After obtaining informed consent, the endoscope was                            passed under direct vision. Throughout the                            procedure, the patient's blood pressure, pulse, and                            oxygen  saturations were monitored continuously. The                            [id number]-[id number] ([id number]) scope was introduced through the                            and advanced to the second part of duodenum. The                            upper GI endoscopy was accomplished without                            difficulty. The patient tolerated the procedure                            well. Scope In: 2:21:53 PM Scope Out: 2:31:35 PM Total Procedure Duration: 0 hours 9 minutes 42 seconds  Findings:      Grade 2 esophageal varices-3 columns involving the distal one third of       the tubular esophagus. Portal gastropathy otherwise normal-appearing       stomach. Normal first and second portion of the duodenum      Microvasive 7 shot bander loaded up. A total of 5 bands placed on the 3       columns. Hemostasis maintained.      Grade I varices were found in the esophagus. They were 5 mm in largest       diameter. Impression:               Portal gastropathy                           - Grade 2 esophageal varices. S/P EBL                           - No specimens collected. Moderate Sedation:      Moderate (conscious) sedation was personally administered by an       anesthesia professional. The following parameters were monitored: oxygen       saturation, heart rate, blood pressure, respiratory rate, EKG, adequacy       of pulmonary ventilation, and response to care. Recommendation:           -  Patient has a contact number available for                            emergencies. The signs and symptoms of potential                            delayed complications were discussed with the                            patient. Return to normal activities tomorrow.                            Written discharge instructions were provided to the                            patient.                           - Advance diet as tolerated.                           - Continue present medications.                            - Repeat upper endoscopy in 1 year for surveillance.                           - Return to GI clinic in 1 month. Chloraseptic                            spray as needed transient sore throat/chest. Procedure Code(s):        --- Professional ---                           [phone], Esophagogastroduodenoscopy, flexible,                            transoral; diagnostic, including collection of                            specimen(s) by brushing or washing, when performed                            (separate procedure) Diagnosis Code(s):        --- Professional ---                           I85.00, Esophageal varices without bleeding CPT copyright 2018 American Medical Association. All rights reserved. The codes documented in this report are preliminary and upon coder review may  be revised to meet current compliance requirements. [name], MD Gennette Pac, MD 09/15/2018 3:05:09 PM This report has been signed electronically. Number of Addenda: 0

## 2018-09-15 NOTE — Addendum Note (Signed)
Addendum  created 09/15/18 1529 by Vista Deck, CRNA   Clinical Note Signed

## 2018-09-15 NOTE — Anesthesia Postprocedure Evaluation (Signed)
Anesthesia Post Note  Patient: Penny Martinez  Procedure(s) Performed: ESOPHAGOGASTRODUODENOSCOPY (EGD) WITH PROPOFOL (N/A ) ESOPHAGEAL BANDING (N/A )  Patient location during evaluation: PACU Anesthesia Type: MAC Level of consciousness: awake and alert and patient cooperative Pain management: satisfactory to patient Vital Signs Assessment: post-procedure vital signs reviewed and stable Respiratory status: spontaneous breathing Cardiovascular status: stable Postop Assessment: no apparent nausea or vomiting Anesthetic complications: no Comments: Pain  is reported to be much better now. Nausea resolved      Last Vitals:  Vitals:   09/15/18 1510 09/15/18 1515  BP: 129/73 (!) 163/70  Pulse: 84 82  Resp: 19 11  Temp:    SpO2: 98% 95%    Last Pain:  Vitals:   09/15/18 1510  TempSrc:   PainSc: 3                  Adaeze Better

## 2018-09-15 NOTE — Anesthesia Postprocedure Evaluation (Signed)
Anesthesia Post Note  Patient: Penny Martinez  Procedure(s) Performed: ESOPHAGOGASTRODUODENOSCOPY (EGD) WITH PROPOFOL (N/A ) ESOPHAGEAL BANDING (N/A )  Patient location during evaluation: PACU Anesthesia Type: MAC Level of consciousness: awake and alert, oriented and patient cooperative Pain management: satisfactory to patient Vital Signs Assessment: post-procedure vital signs reviewed and stable Respiratory status: spontaneous breathing Cardiovascular status: stable Postop Assessment: no apparent nausea or vomiting Anesthetic complications: no     Last Vitals:  Vitals:   09/15/18 1218 09/15/18 1440  BP: 139/60   Pulse: 85   Resp: 19   Temp: 36.7 C 37 C  SpO2: 96%     Last Pain:  Vitals:   09/15/18 1433  TempSrc:   PainSc: 0-No pain                 Indy Prestwood

## 2018-09-15 NOTE — Anesthesia Procedure Notes (Signed)
Procedure Name: MAC Date/Time: 09/15/2018 2:11 PM Performed by: Vista Deck, CRNA Pre-anesthesia Checklist: Patient identified, Emergency Drugs available, Suction available, Timeout performed and Patient being monitored Patient Re-evaluated:Patient Re-evaluated prior to induction Oxygen Delivery Method: Nasal Cannula

## 2018-09-20 ENCOUNTER — Encounter (HOSPITAL_COMMUNITY): Payer: Self-pay | Admitting: Internal Medicine

## 2018-09-29 ENCOUNTER — Other Ambulatory Visit: Payer: Self-pay | Admitting: "Endocrinology

## 2018-10-21 ENCOUNTER — Other Ambulatory Visit: Payer: Self-pay | Admitting: "Endocrinology

## 2018-10-24 NOTE — Progress Notes (Signed)
Referring Provider: Vidal Schwalbe, MD Primary Care Physician:  Vidal Schwalbe, MD  Primary GI: Dr. Gala Romney    Chief Complaint  Patient presents with  . Nausea  . Abdominal Pain    left abd    HPI:   Penny Martinez is a 68 y.o. female presenting today with a history of  NASH cirrhosis, chronic diarrhea, IDA followed by Hematology. She has not been able to remain on non-selective beta blocker due to worsening chronic kidney disease. EGD Jan 2020 with Grade 2 esophageal varices, portal gastropathy, normal first and second duodenum. Status post banding X 5. Surveillance EGD in 1 year.   Due to left-sided abdominal pain at last visit, she had a CT without contrast. Focal wall thickening in mid transverse colon with minimal pericolonic stranding noted, non-specific and possible related to under distension but unable to exclude small area of inflammation, colonic lesions. Small fat-containing ventral hernia. Needs non-contrast CT in Dec 2020 due to small right lung base nodular structures.   She notes some nausea after eating. Still with intermittent left-sided discomfort, some bloating. Aware of abdominal hernia at times. Has chronic UTIs and dealing with a recurrence currently. LLQ pain comes and goes. GERD is not ideally controlled. Previously has taken Prilosec, Protonix, Nexium, and Zantac. Feels Prevacid works best. Chiropractor from [employer] stating quantity limit for Prevacid and needs plan exception.   She is wanting to hold off on colonoscopy for a few months.    Past Medical History:  Diagnosis Date  . Acid reflux   . Anemia   . Arthritis   . C. difficile colitis June/July 2015  . Cancer ([place])    skin cancer  . CHF (congestive heart failure) ([place])   . Cirrhosis ([place])    likely due to NASH. Negative viral markers 2015  . Complication of anesthesia   . COPD (chronic obstructive pulmonary disease) ([place])   . Diabetes mellitus without complication ([place])   . GERD  (gastroesophageal reflux disease)   . History of kidney stones   . Hypercholesteremia   . Hypertension   . Hypothyroidism   . IBS (irritable bowel syndrome)   . Kidney disease   . Liver disease   . Neuropathy   . PONV (postoperative nausea and vomiting)   . Sleep apnea   . Thyroid disease     Past Surgical History:  Procedure Laterality Date  . ABDOMINAL HYSTERECTOMY    . APPENDECTOMY    . CAROTID ARTERY - SUBCLAVIAN ARTERY BYPASS GRAFT Right   . CARPAL TUNNEL RELEASE Right   . CARPAL TUNNEL RELEASE Left 12/03/2016   Procedure: CARPAL TUNNEL RELEASE;  Surgeon: Carole Civil, MD;  Location: [facility];  Service: Orthopedics;  Laterality: Left;  . CATARACT EXTRACTION Bilateral   . CHOLECYSTECTOMY    . COLONOSCOPY N/A 02/19/2014   Dr. Gala Romney: rectal varices, rectal polyp overlying a varix non-manipulated  . ESOPHAGEAL BANDING N/A 08/15/2018   Procedure: ESOPHAGEAL BANDING;  Surgeon: Daneil Dolin, MD;  Location: [facility];  Service: Endoscopy;  Laterality: N/A;  . ESOPHAGEAL BANDING N/A 09/15/2018   Procedure: ESOPHAGEAL BANDING;  Surgeon: Daneil Dolin, MD;  Location: [facility];  Service: Endoscopy;  Laterality: N/A;  . ESOPHAGOGASTRODUODENOSCOPY N/A 02/19/2014   Dr. Gala Romney: 4 columns of grade 2 esophageal varices, multiple gastric polyps with largest biopsied and hyperplastic. Negative H.pylori  . ESOPHAGOGASTRODUODENOSCOPY N/A 04/13/2018   Dr. Gala Romney: Grade 3 esophageal varices, no esophagitis, portal gastropathy,  multiple hyperplastic appearing polyps  . ESOPHAGOGASTRODUODENOSCOPY (EGD) WITH PROPOFOL N/A 08/15/2018   Procedure: ESOPHAGOGASTRODUODENOSCOPY (EGD) WITH PROPOFOL;  Surgeon: Daneil Dolin, MD;  Location: [facility];  Service: Endoscopy;  Laterality: N/A;  1:45pm  . ESOPHAGOGASTRODUODENOSCOPY (EGD) WITH PROPOFOL N/A 09/15/2018   Dr. Gala Romney: Grade 2 esophageal varices, portal gastropathy, normal first and second duodenum, s/p banding X 5. Surveillance in 1 year  .  GIVENS CAPSULE STUDY N/A 05/04/2014   multiple polypoid-like lesions throughout small bowel, scattered superficial non-bleeding erosions more distal bowel, referred to [facility] enteroscopy. Capsule study not complete, poor images.   Marland Kitchen TRIGGER FINGER RELEASE Left 12/03/2016   Procedure: RELEASE TRIGGER FINGER/A-1 PULLEY LEFT LONG TRIGGER FINGER RELEASE;  Surgeon: Carole Civil, MD;  Location: [facility];  Service: Orthopedics;  Laterality: Left;  . urosepsis      Current Outpatient Medications  Medication Sig Dispense Refill  . amitriptyline (ELAVIL) 25 MG tablet Take 25 mg by mouth at bedtime.    . Azelastine HCl 137 MCG/SPRAY SOLN Place 1 spray into both nostrils as needed (for congestion).   2  . benazepril (LOTENSIN) 20 MG tablet Take 20 mg by mouth daily.  3  . CRANBERRY PO Take 15,000 mg by mouth at bedtime.     . diphenhydrAMINE (SOMINEX) 25 MG tablet Take 50 mg by mouth at bedtime.     . diphenoxylate-atropine (LOMOTIL) 2.5-0.025 MG tablet 1-2 tablets every 4-6 hours as needed for diarrhea. (Patient taking differently: Take 1-2 tablets by mouth 4 (four) times daily as needed for diarrhea or loose stools. 1-2 tablets every 4-6 hours as needed for diarrhea.) 120 tablet 1  . donepezil (ARICEPT) 5 MG tablet Take 5 mg by mouth at bedtime.     Marland Kitchen ezetimibe (ZETIA) 10 MG tablet Take 10 mg by mouth every morning.     . furosemide (LASIX) 80 MG tablet Take 80 mg by mouth every morning.     . gabapentin (NEURONTIN) 300 MG capsule Take 300 mg by mouth at bedtime.    . insulin regular human CONCENTRATED (HUMULIN R U-500 KWIKPEN) 500 UNIT/ML kwikpen Inject 100 Units into the skin 3 (three) times daily with meals. (Patient taking differently: Inject 100 Units into the skin 3 (three) times daily with meals. Sometimes takes less depending on blood sugar) 12 mL 2  . lansoprazole (PREVACID) 30 MG capsule Take 1 capsule (30 mg total) by mouth 2 (two) times daily before a meal. (Patient taking differently:  Take 30 mg by mouth 2 (two) times daily. ) 60 capsule 11  . levothyroxine (SYNTHROID, LEVOTHROID) 100 MCG tablet TAKE 1 TABLET BY MOUTH ONCE DAILY. 90 tablet 0  . Multiple Vitamins-Minerals (CENTRUM SILVER ADULT 50+ PO) Take 1 tablet by mouth daily.    Marland Kitchen VICTOZA 18 MG/3ML SOPN INJECT 1.8MG S.Q. ONCE DAILY 9 mL 0  . Vitamin D, Ergocalciferol, (DRISDOL) 50000 units CAPS capsule Take 50,000 Units by mouth every 30 (thirty) days.   1   No current facility-administered medications for this visit.     Allergies as of 10/25/2018 - Review Complete 10/25/2018  Allergen Reaction Noted  . Erythromycin Hives 02/15/2014  . Niacin Rash and Shortness Of Breath 03/05/2014  . Penicillins Shortness Of Breath and Other (See Comments) 02/15/2014  . Sulfa antibiotics Hives 02/15/2014  . Ciprofloxacin Itching and Nausea And Vomiting 05/23/2018  . Statins Other (See Comments) 03/05/2014  . Codeine Itching 11/26/2016    Family History  Problem Relation Age of Onset  .  Diabetes Mother   . Hypertension Mother   . Kidney failure Mother   . Blindness Mother   . Aneurysm Father   . Colon cancer Neg Hx     Social History   Socioeconomic History  . Marital status: Married    Spouse name: Not on file  . Number of children: Not on file  . Years of education: Not on file  . Highest education level: Not on file  Occupational History  . Not on file  Social Needs  . Financial resource strain: Not on file  . Food insecurity:    Worry: Not on file    Inability: Not on file  . Transportation needs:    Medical: Not on file    Non-medical: Not on file  Tobacco Use  . Smoking status: Former Smoker    Packs/day: 2.00    Years: 35.00    Pack years: 70.00    Last attempt to quit: 05/05/2003    Years since quitting: 15.5  . Smokeless tobacco: Never Used  . Tobacco comment: Quit smoking 11 years ago  Substance and Sexual Activity  . Alcohol use: No  . Drug use: No  . Sexual activity: Not Currently     Birth control/protection: Surgical  Lifestyle  . Physical activity:    Days per week: Not on file    Minutes per session: Not on file  . Stress: Not on file  Relationships  . Social connections:    Talks on phone: Not on file    Gets together: Not on file    Attends religious service: Not on file    Active member of club or organization: Not on file    Attends meetings of clubs or organizations: Not on file    Relationship status: Not on file  Other Topics Concern  . Not on file  Social History Narrative  . Not on file    Review of Systems: Gen: Denies fever, chills, anorexia. Denies fatigue, weakness, weight loss.  CV: Denies chest pain, palpitations, syncope, peripheral edema, and claudication. Resp: Denies dyspnea at rest, cough, wheezing, coughing up blood, and pleurisy. GI: see HPI Derm: Denies rash, itching, dry skin Psych: Denies depression, anxiety, memory loss, confusion. No homicidal or suicidal ideation.  Heme: Denies bruising, bleeding, and enlarged lymph nodes.  Physical Exam: BP (!) 142/74   Pulse 89   Temp 98.2 F (36.8 C) (Oral)   Ht 5' 1"  (1.549 m)   Wt 220 lb 6.4 oz (100 kg)   BMI 41.64 kg/m  General:   Alert and oriented. No distress noted. Pleasant and cooperative.  Head:  Normocephalic and atraumatic. Eyes:  Conjuctiva clear without scleral icterus. Mouth:  Oral mucosa pink and moist.  Abdomen:  +BS, soft, non-tender and non-distended. No rebound or guarding. No HSM or masses noted. Ventral hernia.  Msk:  Symmetrical without gross deformities. Normal posture. Extremities:  Without edema. Neurologic:  Alert and  oriented x4 Psych:  Alert and cooperative. Normal mood and affect.

## 2018-10-25 ENCOUNTER — Ambulatory Visit (INDEPENDENT_AMBULATORY_CARE_PROVIDER_SITE_OTHER): Payer: Medicare Other | Admitting: Gastroenterology

## 2018-10-25 ENCOUNTER — Encounter: Payer: Self-pay | Admitting: Gastroenterology

## 2018-10-25 VITALS — BP 142/74 | HR 89 | Temp 98.2°F | Ht 61.0 in | Wt 220.4 lb

## 2018-10-25 DIAGNOSIS — K219 Gastro-esophageal reflux disease without esophagitis: Secondary | ICD-10-CM | POA: Diagnosis not present

## 2018-10-25 DIAGNOSIS — K7460 Unspecified cirrhosis of liver: Secondary | ICD-10-CM | POA: Diagnosis not present

## 2018-10-25 NOTE — Patient Instructions (Signed)
I would like for you to try the samples of Dexilant instead of Prevacid. Let me know how this works for you. I will still work on the approval letter for Prevacid just in case.  I would like to see you back in June 2020. If you are able to do the colonoscopy sooner, please let me know!  I enjoyed seeing you again today! As you know, I value our relationship and want to provide genuine, compassionate, and quality care. I welcome your feedback. If you receive a survey regarding your visit,  I greatly appreciate you taking time to fill this out. See you next time!  Annitta Needs, PhD, ANP-BC [facility]

## 2018-10-31 ENCOUNTER — Encounter: Payer: Self-pay | Admitting: Gastroenterology

## 2018-10-31 ENCOUNTER — Telehealth: Payer: Self-pay | Admitting: Gastroenterology

## 2018-10-31 NOTE — Progress Notes (Signed)
cc'd to pcp 

## 2018-10-31 NOTE — Assessment & Plan Note (Addendum)
Previously failing Prilosec, Protonix, Nexium, and Zantac. Now with insurance unable to continue coverage of Prevacid without a letter requesting exception for quantity limit. Letter to be drafted and sent to [facility]. In interim, will also trial Dexilant samples. If this provides better relief of GERD, then we will attempt coverage for Dexilant. In interim, letter to be sent.

## 2018-10-31 NOTE — Progress Notes (Signed)
Letter completed for quantity limit exception of lansoprazole.

## 2018-10-31 NOTE — Telephone Encounter (Signed)
Lmom, waiting on a return call.  

## 2018-10-31 NOTE — Assessment & Plan Note (Signed)
68 year old female with history of NASH cirrhosis, due for surveillance EGD in 2021 due to history of varices and unable to tolerate non-selective beta blockers in setting of renal status. Continues with vague left-sided abdominal pain, with no CT findings to explain pain but did note focal wall thickening in mid transverse colon with minimal pericolonic stranding noted. Possibly under distension but unable to rule out lesion. Doubt known ventral hernia is causing this left-sided discomfort. Discussed diagnostic colonoscopy, but she is currently dealing with other health issues and would like to postpone a few months. Discussed importance of pursuing colonoscopy in timely manner as unable to exclude lesion; I suspect this is more related to underdistension but again, needs diagnostic colonoscopy to ensure no occult mass.  Return in June 2020 to arrange colonoscopy Next EGD in Jan 2021 Arrange [place] in June 2020

## 2018-10-31 NOTE — Telephone Encounter (Signed)
Alicia:   I have drafted a letter to be sent to [facility] for quantity limit exception of lansoprazole. The address I have to send is this:  [employer] [place] Attn: Medicare Part D Coverage Determination [address] [address] [place], [place], [address]   However, can we ask her if the letter she has (which she showed me at the office) has fax number or contact number? May be easier just to fax.

## 2018-11-01 NOTE — Telephone Encounter (Signed)
Thank you, Elmo Putt!

## 2018-11-01 NOTE — Telephone Encounter (Signed)
Spoke with pt. She didn't have a number to fax and couldn't locate the letter. I called pt's insurance and was given fax number [phone]. Letter was faxed to plan, waiting on a response back from [employer].

## 2018-11-18 ENCOUNTER — Other Ambulatory Visit: Payer: Self-pay | Admitting: "Endocrinology

## 2018-12-08 ENCOUNTER — Other Ambulatory Visit: Payer: Self-pay | Admitting: "Endocrinology

## 2018-12-13 ENCOUNTER — Other Ambulatory Visit (HOSPITAL_COMMUNITY): Payer: Medicare Other

## 2018-12-13 ENCOUNTER — Ambulatory Visit (HOSPITAL_COMMUNITY): Payer: Medicare Other | Admitting: Hematology

## 2018-12-19 ENCOUNTER — Ambulatory Visit: Payer: BLUE CROSS/BLUE SHIELD | Admitting: "Endocrinology

## 2018-12-24 ENCOUNTER — Other Ambulatory Visit: Payer: Self-pay | Admitting: "Endocrinology

## 2019-01-11 ENCOUNTER — Other Ambulatory Visit: Payer: Self-pay

## 2019-01-11 MED ORDER — FREESTYLE LIBRE 14 DAY SENSOR MISC: 1.0000 | 2 refills | Status: DC

## 2019-01-13 LAB — COMPLETE METABOLIC PANEL WITH GFR
AG Ratio: 1.1 (calc) (ref 1.0–2.5)
ALT: 14 U/L (ref 6–29)
AST: 35 U/L (ref 10–35)
Albumin: 3.2 g/dL — ABNORMAL LOW (ref 3.6–5.1)
Alkaline phosphatase (APISO): 122 U/L (ref 37–153)
BUN: 13 mg/dL (ref 7–25)
CO2: 31 mmol/L (ref 20–32)
Calcium: 8.6 mg/dL (ref 8.6–10.4)
Chloride: 99 mmol/L (ref 98–110)
Creat: 0.97 mg/dL (ref 0.50–0.99)
GFR, Est African American: 70 mL/min/{1.73_m2} (ref >=60)
GFR, Est Non African American: 60 mL/min/{1.73_m2} (ref >=60)
Globulin: 2.8 g/dL (calc) (ref 1.9–3.7)
Glucose, Bld: 167 mg/dL — ABNORMAL HIGH (ref 65–99)
Potassium: 4.3 mmol/L (ref 3.5–5.3)
Sodium: 137 mmol/L (ref 135–146)
Total Bilirubin: 0.8 mg/dL (ref 0.2–1.2)
Total Protein: 6 g/dL — ABNORMAL LOW (ref 6.1–8.1)

## 2019-01-13 LAB — VITAMIN D 25 HYDROXY (VIT D DEFICIENCY, FRACTURES): Vit D, 25-Hydroxy: 25 ng/mL — ABNORMAL LOW (ref 30–100)

## 2019-01-13 LAB — LIPID PANEL
Cholesterol: 149 mg/dL (ref <200)
HDL: 39 mg/dL — AB (ref >=50)
LDL CHOLESTEROL (CALC): 86 mg/dL
Non-HDL Cholesterol (Calc): 110 mg/dL (calc) (ref <130)
TRIGLYCERIDES: 143 mg/dL (ref <150)
Total CHOL/HDL Ratio: 3.8 (calc) (ref <5.0)

## 2019-01-13 LAB — HEMOGLOBIN A1C
Hgb A1c MFr Bld: 6.7 % of total Hgb — ABNORMAL HIGH (ref <5.7)
Mean Plasma Glucose: 146 (calc)
eAG (mmol/L): 8.1 (calc)

## 2019-01-23 ENCOUNTER — Other Ambulatory Visit: Payer: Self-pay | Admitting: "Endocrinology

## 2019-02-01 ENCOUNTER — Ambulatory Visit (INDEPENDENT_AMBULATORY_CARE_PROVIDER_SITE_OTHER): Payer: Medicare Other | Admitting: "Endocrinology

## 2019-02-01 ENCOUNTER — Other Ambulatory Visit: Payer: Self-pay

## 2019-02-01 ENCOUNTER — Encounter: Payer: Self-pay | Admitting: "Endocrinology

## 2019-02-01 DIAGNOSIS — E038 Other specified hypothyroidism: Secondary | ICD-10-CM | POA: Diagnosis not present

## 2019-02-01 DIAGNOSIS — E1122 Type 2 diabetes mellitus with diabetic chronic kidney disease: Secondary | ICD-10-CM | POA: Diagnosis not present

## 2019-02-01 DIAGNOSIS — N183 Chronic kidney disease, stage 3 (moderate): Secondary | ICD-10-CM

## 2019-02-01 DIAGNOSIS — I10 Essential (primary) hypertension: Secondary | ICD-10-CM | POA: Diagnosis not present

## 2019-02-01 DIAGNOSIS — E782 Mixed hyperlipidemia: Secondary | ICD-10-CM

## 2019-02-01 MED ORDER — INSULIN REGULAR HUMAN (CONC) 500 UNIT/ML ~~LOC~~ SOPN: 80.0000 [IU] | PEN_INJECTOR | Freq: Three times a day (TID) | SUBCUTANEOUS | 2 refills | Status: DC

## 2019-02-01 NOTE — Progress Notes (Signed)
Pt currently using Freestyle Libre to monitor BG levels.   Endocrinology follow-up note  Subjective:    Patient ID: Penny Martinez, female    DOB: 1951/02/14,    Past Medical History:  Diagnosis Date  . Acid reflux   . Anemia   . Arthritis   . C. difficile colitis June/July 2015  . Cancer ([place])    skin cancer  . CHF (congestive heart failure) ([place])   . 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 Negative viral markers 2015  . Complication of anesthesia   . COPD (chronic obstructive pulmonary disease) ([place])   . Diabetes mellitus without complication ([place])   . GERD (gastroesophageal reflux disease)   . History of kidney stones   . Hypercholesteremia   . Hypertension   . Hypothyroidism   . IBS (irritable bowel syndrome)   . Kidney disease   . Liver disease   . Neuropathy   . PONV (postoperative nausea and vomiting)   . Sleep apnea   . Thyroid disease    Past Surgical History:  Procedure Laterality Date  . ABDOMINAL HYSTERECTOMY    . APPENDECTOMY    . CAROTID ARTERY - SUBCLAVIAN ARTERY BYPASS GRAFT Right   . CARPAL TUNNEL RELEASE Right   . CARPAL TUNNEL RELEASE Left 12/03/2016   Procedure: CARPAL TUNNEL RELEASE;  Surgeon: Carole Civil, MD;  Location: [facility];  Service: Orthopedics;  Laterality: Left;  . CATARACT EXTRACTION Bilateral   . CHOLECYSTECTOMY    . COLONOSCOPY N/A 02/19/2014   Dr. Gala Romney: rectal varices, rectal polyp overlying a varix non-manipulated  . ESOPHAGEAL BANDING N/A 08/15/2018   Procedure: ESOPHAGEAL BANDING;  Surgeon: Daneil Dolin, MD;  Location: [facility];  Service: Endoscopy;  Laterality: N/A;  . ESOPHAGEAL BANDING N/A 09/15/2018   Procedure: ESOPHAGEAL BANDING;  Surgeon: Daneil Dolin, MD;  Location: [facility];  Service: Endoscopy;  Laterality: N/A;  . ESOPHAGOGASTRODUODENOSCOPY N/A 02/19/2014   Dr. Gala Romney: 4 columns of grade 2 esophageal varices, multiple gastric polyps with largest biopsied and hyperplastic. Negative H.pylori  .  ESOPHAGOGASTRODUODENOSCOPY N/A 04/13/2018   Dr. Gala Romney: Grade 3 esophageal varices, no esophagitis, portal gastropathy, multiple hyperplastic appearing polyps  . ESOPHAGOGASTRODUODENOSCOPY (EGD) WITH PROPOFOL N/A 08/15/2018   Procedure: ESOPHAGOGASTRODUODENOSCOPY (EGD) WITH PROPOFOL;  Surgeon: Daneil Dolin, MD;  Location: [facility];  Service: Endoscopy;  Laterality: N/A;  1:45pm  . ESOPHAGOGASTRODUODENOSCOPY (EGD) WITH PROPOFOL N/A 09/15/2018   Dr. Gala Romney: Grade 2 esophageal varices, portal gastropathy, normal first and second duodenum, s/p banding X 5. Surveillance in 1 year  . GIVENS CAPSULE STUDY N/A 05/04/2014   multiple polypoid-like lesions throughout small bowel, scattered superficial non-bleeding erosions more distal bowel, referred to [facility] for [facility]oscopy. Capsule study not complete, poor images.   Marland Kitchen TRIGGER FINGER RELEASE Left 12/03/2016   Procedure: RELEASE TRIGGER FINGER/A-1 PULLEY LEFT LONG TRIGGER FINGER RELEASE;  Surgeon: Carole Civil, MD;  Location: [facility];  Service: Orthopedics;  Laterality: Left;  . urosepsis     Social History   Socioeconomic History  . Marital status: Married    Spouse name: Not on file  . Number of children: Not on file  . Years of education: Not on file  . Highest education level: Not on file  Occupational History  . Not on file  Social Needs  . Financial resource strain: Not on file  . Food insecurity:    Worry: Not on file    Inability: Not on file  .  Transportation needs:    Medical: Not on file    Non-medical: Not on file  Tobacco Use  . Smoking status: Former Smoker    Packs/day: 2.00    Years: 35.00    Pack years: 70.00    Last attempt to quit: 05/05/2003    Years since quitting: 15.7  . Smokeless tobacco: Never Used  . Tobacco comment: Quit smoking 11 years ago  Substance and Sexual Activity  . Alcohol use: No  . Drug use: No  . Sexual activity: Not Currently    Birth control/protection: Surgical  Lifestyle  . Physical  activity:    Days per week: Not on file    Minutes per session: Not on file  . Stress: Not on file  Relationships  . Social connections:    Talks on phone: Not on file    Gets together: Not on file    Attends religious service: Not on file    Active member of club or organization: Not on file    Attends meetings of clubs or organizations: Not on file    Relationship status: Not on file  Other Topics Concern  . Not on file  Social History Narrative  . Not on file   Outpatient Encounter Medications as of 02/01/2019  Medication Sig  . amitriptyline (ELAVIL) 25 MG tablet Take 25 mg by mouth at bedtime.  . Azelastine HCl 137 MCG/SPRAY SOLN Place 1 spray into both nostrils as needed (for congestion).   . benazepril (LOTENSIN) 20 MG tablet Take 20 mg by mouth daily.  . Continuous Blood Gluc Sensor (FREESTYLE LIBRE 14 DAY SENSOR) MISC 1 each by Does not apply route every 14 (fourteen) days.  Marland Kitchen CRANBERRY PO Take 15,000 mg by mouth at bedtime.   . diphenhydrAMINE (SOMINEX) 25 MG tablet Take 50 mg by mouth at bedtime.   . diphenoxylate-atropine (LOMOTIL) 2.5-0.025 MG tablet 1-2 tablets every 4-6 hours as needed for diarrhea. (Patient taking differently: Take 1-2 tablets by mouth 4 (four) times daily as needed for diarrhea or loose stools. 1-2 tablets every 4-6 hours as needed for diarrhea.)  . donepezil (ARICEPT) 5 MG tablet Take 5 mg by mouth at bedtime.   Marland Kitchen ezetimibe (ZETIA) 10 MG tablet Take 10 mg by mouth every morning.   . furosemide (LASIX) 80 MG tablet Take 80 mg by mouth every morning.   . gabapentin (NEURONTIN) 100 MG capsule TAKE (1) CAPSULE BY MOUTH THREE TIMES DAILY  . gabapentin (NEURONTIN) 300 MG capsule Take 300 mg by mouth at bedtime.  . insulin regular human CONCENTRATED (HUMULIN R U-500 KWIKPEN) 500 UNIT/ML kwikpen Inject 80 Units into the skin 3 (three) times daily with meals.  . lansoprazole (PREVACID) 30 MG capsule Take 1 capsule (30 mg total) by mouth 2 (two) times daily  before a meal. (Patient taking differently: Take 30 mg by mouth 2 (two) times daily. )  . levothyroxine (SYNTHROID) 100 MCG tablet TAKE 1 TABLET BY MOUTH ONCE DAILY.  . Multiple Vitamins-Minerals (CENTRUM SILVER ADULT 50+ PO) Take 1 tablet by mouth daily.  Marland Kitchen VICTOZA 18 MG/3ML SOPN INJECT 1.8MG SUBCUTANEOUSLY ONCE DAILY.  Marland Kitchen Vitamin D, Ergocalciferol, (DRISDOL) 50000 units CAPS capsule Take 50,000 Units by mouth every 30 (thirty) days.   . [DISCONTINUED] HUMULIN R U-500 KWIKPEN 500 UNIT/ML kwikpen INJECT S.Q. 100 UNITS INTO THE SKIN THREE TIMES DAILY WITH MEALS.   No facility-administered encounter medications on file as of 02/01/2019.    ALLERGIES: Allergies  Allergen Reactions  . Erythromycin  Hives  . Niacin Rash and Shortness Of Breath  . Penicillins Shortness Of Breath and Other (See Comments)    Has patient had a PCN reaction causing immediate rash, facial/tongue/throat swelling, SOB or lightheadedness with hypotension: Yes Has patient had a PCN reaction causing severe rash involving mucus membranes or skin necrosis: No Has patient had a PCN reaction that required hospitalization: No Has patient had a PCN reaction occurring within the last 10 years: No If all of the above answers are "NO", then may proceed with Cephalosporin use.   . Sulfa Antibiotics Hives  . Ciprofloxacin Itching and Nausea And Vomiting    Stomach pain  . Statins Other (See Comments)    Muscle Pain  . Codeine Itching   VACCINATION STATUS: Immunization History  Administered Date(s) Administered  . Influenza, High Dose Seasonal PF 05/24/2018  . Influenza,inj,Quad PF,6+ Mos 07/24/2016, 06/24/2017  . Pneumococcal Polysaccharide-23 05/25/2018    Diabetes  She presents for her follow-up diabetic visit. She has type 2 diabetes mellitus. Onset time: She was diagnosed at approximate age of 89 years. Her disease course has been improving. There are no hypoglycemic associated symptoms. Pertinent negatives for  hypoglycemia include no confusion, headaches, pallor or seizures. Associated symptoms include fatigue. Pertinent negatives for diabetes include no chest pain, no polydipsia, no polyphagia and no polyuria. There are no hypoglycemic complications. Symptoms are improving. Diabetic complications include heart disease, nephropathy and PVD. Risk factors for coronary artery disease include diabetes mellitus, dyslipidemia, obesity, hypertension, sedentary lifestyle and tobacco exposure. Current diabetic treatment includes intensive insulin program. She is compliant with treatment most of the time. Her weight is fluctuating minimally. She is following a generally unhealthy diet. She never participates in exercise. Her home blood glucose trend is fluctuating minimally. Her breakfast blood glucose range is generally 130-140 mg/dl. Her lunch blood glucose range is generally 140-180 mg/dl. Her dinner blood glucose range is generally 130-140 mg/dl. Her bedtime blood glucose range is generally 140-180 mg/dl. Her overall blood glucose range is 140-180 mg/dl. An ACE inhibitor/angiotensin II receptor blocker is being taken. Eye exam is current.  Hypertension  This is a chronic problem. The current episode started more than 1 year ago. The problem is controlled. Pertinent negatives include no chest pain, headaches, palpitations or shortness of breath. Risk factors for coronary artery disease include diabetes mellitus, dyslipidemia, obesity, sedentary lifestyle and post-menopausal state. Hypertensive end-organ damage includes PVD.  Hyperlipidemia  This is a chronic problem. The current episode started more than 1 year ago. Exacerbating diseases include diabetes and obesity. Pertinent negatives include no chest pain, myalgias or shortness of breath. Risk factors for coronary artery disease include diabetes mellitus, dyslipidemia, hypertension, a sedentary lifestyle and obesity.    Review of Systems  Constitutional: Positive for  fatigue. Negative for unexpected weight change.  HENT: Negative for trouble swallowing and voice change.   Eyes: Negative for visual disturbance.  Respiratory: Negative for cough, shortness of breath and wheezing.   Cardiovascular: Negative for chest pain, palpitations and leg swelling.  Gastrointestinal: Negative for diarrhea, nausea and vomiting.  Endocrine: Negative for cold intolerance, heat intolerance, polydipsia, polyphagia and polyuria.  Musculoskeletal: Negative for arthralgias and myalgias.  Skin: Negative for color change, pallor, rash and wound.  Neurological: Negative for seizures and headaches.  Psychiatric/Behavioral: Negative for confusion and suicidal ideas.    Objective:    There were no vitals taken for this visit.  Wt Readings from Last 3 Encounters:  10/25/18 220 lb 6.4 oz (100 kg)  09/15/18 219 lb 2.2 oz (99.4 kg)  09/12/18 219 lb 3.2 oz (99.4 kg)      Results for orders placed or performed during the hospital encounter of 09/15/18  Glucose, capillary  Result Value Ref Range   Glucose-Capillary 208 (H) 70 - 99 mg/dL  Glucose, capillary  Result Value Ref Range   Glucose-Capillary 187 (H) 70 - 99 mg/dL   Complete Blood Count (Most recent): Lab Results  Component Value Date   WBC 4.4 09/12/2018   HGB 12.5 09/12/2018   HCT 40.1 09/12/2018   MCV 93.3 09/12/2018   PLT 74 (L) 09/12/2018    Diabetic Labs (most recent): Lab Results  Component Value Date   HGBA1C 6.7 (H) 01/12/2019   HGBA1C 7.1 (H) 08/02/2018   HGBA1C 7.1 (H) 04/18/2018      Assessment & Plan:   1. Uncontrolled type 2 diabetes mellitus with complication, with long-term current use of insulin ([place])  Her diabetes is  complicated by chronic kidney disease and peripheral arterial disease as well as CHF. -She continued to respond to insulin U500. -She reports improved glycemic profile with random hypoglycemia.  Her A1c 6.7%, improving.    - She remains at a high risk for more acute  and chronic complications of diabetes which include CAD, CVA, CKD, retinopathy, and neuropathy. These are all discussed in detail with the patient.    Glucose logs and insulin administration records pertaining to this visit,  to be scanned into patient's records.  Recent labs reviewed.   - I have re-counseled the patient on diet management and weight loss  by adopting a carbohydrate restricted / protein rich  Diet.  - Patient admits there is a room for improvement in her diet and drink choices. -  Suggestion is made for her to avoid simple carbohydrates  from her diet including Cakes, Sweet Desserts / Pastries, Ice Cream, Soda (diet and regular), Sweet Tea, Candies, Chips, Cookies, Store Bought Juices, Alcohol in Excess of  1-2 drinks a day, Artificial Sweeteners, and "Sugar-free" Products. This will help patient to have stable blood glucose profile and potentially avoid unintended weight gain.   - Patient is advised to stick to a routine mealtimes to eat 3 meals  a day and avoid unnecessary snacks ( to snack only to correct hypoglycemia).  - I have approached patient with the following individualized plan to manage diabetes and patient agrees.  -She is  at risk of both hypoglycemia and hyperglycemia due to possible cognitive decline.   -Due to her random hypoglycemia, she is advised to lower her insulin U500 to 80 units 3 times daily AC only for pre-meal blood glucose readings greater than 90 mg/dL, associated with strict monitoring of blood glucose 4 times a day-before meals and at bedtime.   She is advised to wear her CGM device at all times. -She advised to continue Victoza 1.8 mg.  -Patient is encouraged to call clinic for blood glucose levels less than 70 or above 300 mg /dl. -She declined referral to the dietitian.  -Patient is not a candidate for metformin andSGLT2 inhibitors due to CKD.  - Patient specific target  for A1c; LDL, HDL, Triglycerides, and  Waist Circumference were  discussed in detail.  2) BP/HTN: she is advised to home monitor blood pressure and report if > 140/90 on 2 separate readings.  She is advised to continue her current blood pressure medications including benazepril 20 mg p.o. daily, Lasix as needed, spironolactone 25 mg p.o. Daily.  3)  Lipids/HPL: She does not tolerate statins.  She is on Zetia 10 mg p.o. Daily.  She will be considered for fasting lipid panel on subsequent visits.     4)  Weight/Diet:  exercise, and carbohydrates information provided.  4) Primary hypothyroidism  -Her thyroid function tests are consistent with appropriate replacement.   -She is advised to continue levothyroxine 100 mcg p.o. every morning.   - We discussed about the correct intake of her thyroid hormone, on empty stomach at fasting, with water, separated by at least 30 minutes from breakfast and other medications,  and separated by more than 4 hours from calcium, iron, multivitamins, acid reflux medications (PPIs). -Patient is made aware of the fact that thyroid hormone replacement is needed for life, dose to be adjusted by periodic monitoring of thyroid function tests.   6) Chronic Care/Health Maintenance:  -Patient is  on ACEI/ARB and Statin medications and encouraged to continue to follow up with Ophthalmology, Podiatrist at least yearly or according to recommendations, and advised to  stay away from smoking. I have recommended yearly flu vaccine and pneumonia vaccination at least every 5 years; and  sleep for at least 7 hours a day. - She cannot exercise at optimum level. I advised patient to maintain close follow up with her PCP for primary care needs.  - Patient Care Time Today:  25 min, of which >50% was spent in reviewing her  current and  previous labs/studies, her blood glucose readings, previous treatments, and medications doses and developing a plan for long-term care based on the latest recommendations for standards of care.  Penny Martinez  participated in the discussions, expressed understanding, and voiced agreement with the above plans.  All questions were answered to her satisfaction. she is encouraged to contact clinic should she have any questions or concerns prior to her return visit.    Follow up plan: Return in about 4 months (around 06/04/2019) for Follow up with Pre-visit Labs, Meter, and Logs.  Glade Lloyd, MD Phone: [phone]  Fax: [phone]  This note was partially dictated with voice recognition software. Similar sounding words can be transcribed inadequately or may not  be corrected upon review.  02/01/2019, 1:15 PM

## 2019-02-01 NOTE — Progress Notes (Signed)
Pt is injecting insulin 3 x daily. She is testing 4 times daily. Currently adhering to CGM regimen

## 2019-02-06 ENCOUNTER — Telehealth: Payer: Self-pay | Admitting: Gastroenterology

## 2019-02-06 NOTE — Telephone Encounter (Signed)
Spoke to Dr. Odette Fraction, [facility].  Recent telehealth visit with patient.  Patient reporting low blood pressures, low end of normal, 40 pound weight gain since January.  Patient has a history of cirrhosis, diastolic congestive heart failure, mitral stenosis.  History of renal insufficiency.  On Lasix 80 mg daily. Concern for fluid overload but per patient report no distress. Patient has not had face to face encounter for these concerns.  Given low blood pressures, Dr. Mathis Bud concerned about diuresis as an outpatient.  She plans to see patient back in the office for face-to-face encounter.  She will reach out to [place] with additional information if she feels like the patient needs admission for diuresis.   I informed Dr. Mathis Bud that patient has follow-up office visit here on June 17 and we can reassess her from a cirrhosis standpoint.  PLEASE FAX CMET, A1C, LIPID LABS TO [phone]  FYI ANNA.

## 2019-02-09 NOTE — Telephone Encounter (Signed)
Labs were faxed

## 2019-02-15 ENCOUNTER — Other Ambulatory Visit (HOSPITAL_COMMUNITY): Payer: Medicare Other

## 2019-02-15 ENCOUNTER — Ambulatory Visit (HOSPITAL_COMMUNITY): Payer: Medicare Other | Admitting: Nurse Practitioner

## 2019-02-22 ENCOUNTER — Encounter: Payer: Self-pay | Admitting: *Deleted

## 2019-02-22 ENCOUNTER — Other Ambulatory Visit: Payer: Self-pay | Admitting: *Deleted

## 2019-02-22 ENCOUNTER — Encounter: Payer: Self-pay | Admitting: Gastroenterology

## 2019-02-22 ENCOUNTER — Encounter: Payer: Self-pay | Admitting: Internal Medicine

## 2019-02-22 ENCOUNTER — Other Ambulatory Visit: Payer: Self-pay

## 2019-02-22 ENCOUNTER — Ambulatory Visit (INDEPENDENT_AMBULATORY_CARE_PROVIDER_SITE_OTHER): Payer: Medicare Other | Admitting: Gastroenterology

## 2019-02-22 VITALS — BP 143/63 | HR 90 | Temp 97.7°F | Ht 61.0 in | Wt 231.4 lb

## 2019-02-22 DIAGNOSIS — R188 Other ascites: Secondary | ICD-10-CM | POA: Diagnosis not present

## 2019-02-22 DIAGNOSIS — D500 Iron deficiency anemia secondary to blood loss (chronic): Secondary | ICD-10-CM | POA: Diagnosis not present

## 2019-02-22 DIAGNOSIS — K7460 Unspecified cirrhosis of liver: Secondary | ICD-10-CM

## 2019-02-22 MED ORDER — ONDANSETRON HCL 4 MG PO TABS: 4.0000 mg | ORAL_TABLET | Freq: Three times a day (TID) | ORAL | 1 refills | Status: DC

## 2019-02-22 MED ORDER — ALBUMIN HUMAN 25 % IV SOLN: 0.0000 g | Freq: Once | INTRAVENOUS | Status: DC

## 2019-02-22 NOTE — Patient Instructions (Signed)
I am ordering an ultrasound to assess your liver and the portal vein to make sure there is not any thrombosis or clot that would be causing your symptoms. They will remove fluid if able to do so at that time.   Please have blood work completed while you are at the hospital getting the ultrasound.  Please complete the Hepatitis A and B vaccinations with your primary care.   I will see you back in 6-8 weeks!  I enjoyed seeing you again today! As you know, I value our relationship and want to provide genuine, compassionate, and quality care. I welcome your feedback. If you receive a survey regarding your visit,  I greatly appreciate you taking time to fill this out. See you next time!  Annitta Needs, PhD, ANP-BC [facility]

## 2019-02-22 NOTE — Progress Notes (Signed)
Primary Care Physician:  Vidal Schwalbe, MD  Primary GI: Dr. Gala Romney   Chief Complaint  Patient presents with  . Abdominal Pain  . Nausea    with vomiting    HPI:   Penny Martinez is a 68 y.o. female presenting today with a history of NASH cirrhosis, chronic diarrhea, IDA followed by Hematology. She has not been able to remain on non-selective beta blocker due to worsening chronic kidney disease. EGD Jan 2020 with Grade 2 esophageal varices, portal gastropathy, normal first and second duodenum. Status post banding X 5. Surveillance EGD in 1 year (Jan 2021).   CT earlier this year without contrast: Focal wall thickening in mid transverse colon with minimal pericolonic stranding noted, non-specific and possible related to under distension but unable to exclude small area of inflammation, colonic lesions. Small fat-containing ventral hernia. Needs non-contrast CT in Dec 2020 due to small right lung base nodular structures. She had wanted to hold off on colonoscopy at last visit.   Weight 219 in Jan 2020, 231 currently. Around February noted more swelling. Swelling in lower extremities. Feels abdomen is swollen. Feels swollen everywhere. Abdominal pain in left and right side. Left side bulges out more than normal. Every day will feel some time of discomfort but some days worse than others. Feeling full all the time. Vomiting one night. Gets full off small amounts of food. Feels nauseated during the day. The only thing that relieves is laying down on side. Will look at food and feel sick. Has trouble with blood sugar dropping. Sometimes hernia poking out. Hands swelling intermittently. Can't stand salt. Lasix 80 mg daily. Sometimes urinating a nice amount and sometimes not a lot. History of chronic UTIs. Just finished antibiotics for UTI. After eating will get very nauseated. Prevacid BID.   Wearing a heart monitor for 30 days. Needs carotid artery surgery.   Chronic diarrhea: at baseline.    Past Medical History:  Diagnosis Date  . Acid reflux   . Anemia   . Arthritis   . C. difficile colitis June/July 2015  . Cancer ([employer])    skin cancer  . CHF (congestive heart failure) ([place])   . Cirrhosis ([place])    likely due to NASH. Negative viral markers 2015  . Complication of anesthesia   . COPD (chronic obstructive pulmonary disease) ([place])   . Diabetes mellitus without complication ([place])   . GERD (gastroesophageal reflux disease)   . History of kidney stones   . Hypercholesteremia   . Hypertension   . Hypothyroidism   . IBS (irritable bowel syndrome)   . Kidney disease   . Liver disease   . Neuropathy   . PONV (postoperative nausea and vomiting)   . Sleep apnea   . Thyroid disease     Past Surgical History:  Procedure Laterality Date  . ABDOMINAL HYSTERECTOMY    . APPENDECTOMY    . CAROTID ARTERY - SUBCLAVIAN ARTERY BYPASS GRAFT Right   . CARPAL TUNNEL RELEASE Right   . CARPAL TUNNEL RELEASE Left 12/03/2016   Procedure: CARPAL TUNNEL RELEASE;  Surgeon: Carole Civil, MD;  Location: [facility];  Service: Orthopedics;  Laterality: Left;  . CATARACT EXTRACTION Bilateral   . CHOLECYSTECTOMY    . COLONOSCOPY N/A 02/19/2014   Dr. Gala Romney: rectal varices, rectal polyp overlying a varix non-manipulated  . ESOPHAGEAL BANDING N/A 08/15/2018   Procedure: ESOPHAGEAL BANDING;  Surgeon: Daneil Dolin, MD;  Location: [facility];  Service: Endoscopy;  Laterality:  N/A;  . ESOPHAGEAL BANDING N/A 09/15/2018   Procedure: ESOPHAGEAL BANDING;  Surgeon: Daneil Dolin, MD;  Location: [facility];  Service: Endoscopy;  Laterality: N/A;  . ESOPHAGOGASTRODUODENOSCOPY N/A 02/19/2014   Dr. Gala Romney: 4 columns of grade 2 esophageal varices, multiple gastric polyps with largest biopsied and hyperplastic. Negative H.pylori  . ESOPHAGOGASTRODUODENOSCOPY N/A 04/13/2018   Dr. Gala Romney: Grade 3 esophageal varices, no esophagitis, portal gastropathy, multiple hyperplastic appearing polyps  .  ESOPHAGOGASTRODUODENOSCOPY (EGD) WITH PROPOFOL N/A 08/15/2018   Procedure: ESOPHAGOGASTRODUODENOSCOPY (EGD) WITH PROPOFOL;  Surgeon: Daneil Dolin, MD;  [facility];  Service: Endoscopy;  Laterality: N/A;  1:45pm  . ESOPHAGOGASTRODUODENOSCOPY (EGD) WITH PROPOFOL N/A 09/15/2018   Dr. Gala Romney: Grade 2 esophageal varices, portal gastropathy, normal first and second duodenum, s/p banding X 5. Surveillance in 1 year  . GIVENS CAPSULE STUDY N/A 05/04/2014   multiple polypoid-like lesions throughout small bowel, scattered superficial non-bleeding erosions more distal bowel, referred to [facility] for enteroscopy. Capsule study not complete, poor images.   Marland Kitchen TRIGGER FINGER RELEASE Left 12/03/2016   Procedure: RELEASE TRIGGER FINGER/A-1 PULLEY LEFT LONG TRIGGER FINGER RELEASE;  Surgeon: Carole Civil, MD;  Location: [facility];  Service: Orthopedics;  Laterality: Left;  . urosepsis      Current Outpatient Medications  Medication Sig Dispense Refill  . amitriptyline (ELAVIL) 25 MG tablet Take 25 mg by mouth at bedtime.    . Azelastine HCl 137 MCG/SPRAY SOLN Place 1 spray into both nostrils as needed (for congestion).   2  . benazepril (LOTENSIN) 20 MG tablet Take 20 mg by mouth daily.  3  . Continuous Blood Gluc Sensor (FREESTYLE LIBRE 14 DAY SENSOR) MISC 1 each by Does not apply route every 14 (fourteen) days. 6 each 2  . CRANBERRY PO Take 15,000 mg by mouth at bedtime.     . diphenhydrAMINE (SOMINEX) 25 MG tablet Take 50 mg by mouth at bedtime.     . diphenoxylate-atropine (LOMOTIL) 2.5-0.025 MG tablet 1-2 tablets every 4-6 hours as needed for diarrhea. (Patient taking differently: Take 1-2 tablets by mouth 4 (four) times daily as needed for diarrhea or loose stools. 1-2 tablets every 4-6 hours as needed for diarrhea.) 120 tablet 1  . donepezil (ARICEPT) 5 MG tablet Take 5 mg by mouth at bedtime.     Marland Kitchen ezetimibe (ZETIA) 10 MG tablet Take 10 mg by mouth every morning.     . furosemide (LASIX) 80 MG  tablet Take 80 mg by mouth every morning.     . gabapentin (NEURONTIN) 300 MG capsule Take 300 mg by mouth at bedtime.    . insulin regular human CONCENTRATED (HUMULIN R U-500 KWIKPEN) 500 UNIT/ML kwikpen Inject 80 Units into the skin 3 (three) times daily with meals. 6 pen 2  . lansoprazole (PREVACID) 30 MG capsule Take 1 capsule (30 mg total) by mouth 2 (two) times daily before a meal. (Patient taking differently: Take 30 mg by mouth daily. ) 60 capsule 11  . levothyroxine (SYNTHROID) 100 MCG tablet TAKE 1 TABLET BY MOUTH ONCE DAILY. 90 tablet 0  . Multiple Vitamins-Minerals (CENTRUM SILVER ADULT 50+ PO) Take 1 tablet by mouth daily.    Marland Kitchen VICTOZA 18 MG/3ML SOPN INJECT 1.8MG SUBCUTANEOUSLY ONCE DAILY. 27 mL 0  . Vitamin D, Ergocalciferol, (DRISDOL) 50000 units CAPS capsule Take 50,000 Units by mouth every 30 (thirty) days.   1  . ondansetron (ZOFRAN) 4 MG tablet Take 1 tablet (4 mg total) by mouth 4 (four)  times daily -  before meals and at bedtime. 120 tablet 1   Current Facility-Administered Medications  Medication Dose Route Frequency Provider Last Rate Last Dose  . albumin human 25 % solution 0-100 g  0-100 g Intravenous Once Annitta Needs, NP        Allergies as of 02/22/2019 - Review Complete 02/22/2019  Allergen Reaction Noted  . Erythromycin Hives 02/15/2014  . Niacin Rash and Shortness Of Breath 03/05/2014  . Penicillins Shortness Of Breath and Other (See Comments) 02/15/2014  . Sulfa antibiotics Hives 02/15/2014  . Ciprofloxacin Itching and Nausea And Vomiting 05/23/2018  . Statins Other (See Comments) 03/05/2014  . Codeine Itching 11/26/2016    Family History  Problem Relation Age of Onset  . Diabetes Mother   . Hypertension Mother   . Kidney failure Mother   . Blindness Mother   . Aneurysm Father   . Colon cancer Neg Hx     Social History   Socioeconomic History  . Marital status: Married    Spouse name: Not on file  . Number of children: Not on file  . Years of  education: Not on file  . Highest education level: Not on file  Occupational History  . Not on file  Social Needs  . Financial resource strain: Not on file  . Food insecurity    Worry: Not on file    Inability: Not on file  . Transportation needs    Medical: Not on file    Non-medical: Not on file  Tobacco Use  . Smoking status: Former Smoker    Packs/day: 2.00    Years: 35.00    Pack years: 70.00    Quit date: 05/05/2003    Years since quitting: 15.8  . Smokeless tobacco: Never Used  . Tobacco comment: Quit smoking 11 years ago  Substance and Sexual Activity  . Alcohol use: No  . Drug use: No  . Sexual activity: Not Currently    Birth control/protection: Surgical  Lifestyle  . Physical activity    Days per week: Not on file    Minutes per session: Not on file  . Stress: Not on file  Relationships  . Social Herbalist on phone: Not on file    Gets together: Not on file    Attends religious service: Not on file    Active member of club or organization: Not on file    Attends meetings of clubs or organizations: Not on file    Relationship status: Not on file  Other Topics Concern  . Not on file  Social History Narrative  . Not on file    Review of Systems: As mentioned in HPI  Physical Exam: BP (!) 143/63   Pulse 90   Temp 97.7 F (36.5 C) (Oral)   Ht 5' 1"  (1.549 m)   Wt 231 lb 6.4 oz (105 kg)   BMI 43.72 kg/m  General:   Alert and oriented. No distress noted. Pleasant and cooperative.  Head:  Normocephalic and atraumatic. Eyes:  Conjuctiva clear without scleral icterus. Mouth:  Oral mucosa pink and moist. G Abdomen:  +BS, obese, distended with likely tense ascites, left-sided more protuberant  Extremities:  3+ lower extremity edema to thigh Neurologic:  Alert and  oriented x4 Psych:  Alert and cooperative. Normal mood and affect.

## 2019-02-23 NOTE — Assessment & Plan Note (Signed)
Continue follow-up with Hematology. She was unable to complete labs with Hematology previously due to [place]. I have ordered what was needed in addition to other labs needed and will route as appropriate. CBC, ferritin, LDH ordered (along with CMP, INR, AFP, TSH).

## 2019-02-23 NOTE — Progress Notes (Signed)
cc'ed to pcp °

## 2019-02-23 NOTE — Assessment & Plan Note (Addendum)
68 year old with NASH cirrhosis now with decompensation due to worsening edema and likely ascites on exam. Notable weight gain and tense abdomen. No concern for SBP at this point. Following low sodium diet and under care of cardiology, who is in process of evaluation as well to rule out any cardiac component contributing to fluid status. Pursuing [place] with doppler to assess portal vein, para with fluid analysis and albumin per protocol. Historically, diuretics have been managed by cardiology. I am unable to find documentation of vaccination status, so I have provided Hep A/B vaccination orders as well to obtain through PCP. Return in Aug 2020 for close follow-up. Next EGD in Jan 2021.

## 2019-03-01 ENCOUNTER — Ambulatory Visit (HOSPITAL_COMMUNITY)
Admission: RE | Admit: 2019-03-01 | Discharge: 2019-03-01 | Disposition: A | Discharge status: Home / Self Care | Payer: Medicare Other | Source: Ambulatory Visit | Attending: Gastroenterology | Admitting: Gastroenterology

## 2019-03-01 ENCOUNTER — Other Ambulatory Visit (HOSPITAL_COMMUNITY)
Admission: RE | Admit: 2019-03-01 | Discharge: 2019-03-01 | Disposition: A | Discharge status: Home / Self Care | Payer: Medicare Other | Source: Ambulatory Visit | Attending: Gastroenterology | Admitting: Gastroenterology

## 2019-03-01 ENCOUNTER — Other Ambulatory Visit: Payer: Self-pay

## 2019-03-01 DIAGNOSIS — K7460 Unspecified cirrhosis of liver: Secondary | ICD-10-CM | POA: Diagnosis present

## 2019-03-01 DIAGNOSIS — R188 Other ascites: Secondary | ICD-10-CM | POA: Insufficient documentation

## 2019-03-01 LAB — LACTATE DEHYDROGENASE: LDH: 167 U/L (ref 98–192)

## 2019-03-01 LAB — PROTIME-INR
INR: 1.2 (ref 0.8–1.2)
Prothrombin Time: 14.9 seconds (ref 11.4–15.2)

## 2019-03-01 LAB — COMPREHENSIVE METABOLIC PANEL
ALT: 20 U/L (ref 0–44)
AST: 41 U/L (ref 15–41)
Albumin: 2.9 g/dL — ABNORMAL LOW (ref 3.5–5.0)
Alkaline Phosphatase: 125 U/L (ref 38–126)
Anion gap: 13 (ref 5–15)
BUN: 17 mg/dL (ref 8–23)
CO2: 29 mmol/L (ref 22–32)
Calcium: 8.4 mg/dL — ABNORMAL LOW (ref 8.9–10.3)
Chloride: 95 mmol/L — ABNORMAL LOW (ref 98–111)
Creatinine, Ser: 1.14 mg/dL — ABNORMAL HIGH (ref 0.44–1.00)
GFR calc Af Amer: 58 mL/min — ABNORMAL LOW (ref >60)
GFR calc non Af Amer: 50 mL/min — ABNORMAL LOW (ref >60)
Glucose, Bld: 319 mg/dL — ABNORMAL HIGH (ref 70–99)
Potassium: 4.5 mmol/L (ref 3.5–5.1)
Sodium: 137 mmol/L (ref 135–145)
Total Bilirubin: 1.4 mg/dL — ABNORMAL HIGH (ref 0.3–1.2)
Total Protein: 6.5 g/dL (ref 6.5–8.1)

## 2019-03-01 LAB — CBC WITH DIFFERENTIAL/PLATELET
Abs Immature Granulocytes: 0.03 10*3/uL (ref 0.00–0.07)
Basophils Absolute: 0 10*3/uL (ref 0.0–0.1)
Basophils Relative: 0 %
Eosinophils Absolute: 0 10*3/uL (ref 0.0–0.5)
Eosinophils Relative: 1 %
HCT: 35.5 % — ABNORMAL LOW (ref 36.0–46.0)
Hemoglobin: 10.8 g/dL — ABNORMAL LOW (ref 12.0–15.0)
Immature Granulocytes: 1 %
Lymphocytes Relative: 14 %
Lymphs Abs: 0.8 10*3/uL (ref 0.7–4.0)
MCH: 26.5 pg (ref 26.0–34.0)
MCHC: 30.4 g/dL (ref 30.0–36.0)
MCV: 87 fL (ref 80.0–100.0)
Monocytes Absolute: 0.5 10*3/uL (ref 0.1–1.0)
Monocytes Relative: 9 %
Neutro Abs: 4.1 10*3/uL (ref 1.7–7.7)
Neutrophils Relative %: 75 %
Platelets: 115 10*3/uL — ABNORMAL LOW (ref 150–400)
RBC: 4.08 MIL/uL (ref 3.87–5.11)
RDW: 15.3 % (ref 11.5–15.5)
WBC: 5.5 10*3/uL (ref 4.0–10.5)
nRBC: 0 % (ref 0.0–0.2)

## 2019-03-01 LAB — GRAM STAIN

## 2019-03-01 LAB — BODY FLUID CELL COUNT WITH DIFFERENTIAL
Eos, Fluid: 0 %
Lymphs, Fluid: 58 %
Monocyte-Macrophage-Serous Fluid: 35 % — ABNORMAL LOW (ref 50–90)
Neutrophil Count, Fluid: 7 % (ref 0–25)
Other Cells, Fluid: ABNORMAL %
Total Nucleated Cell Count, Fluid: 348 cu mm (ref 0–1000)

## 2019-03-01 LAB — TSH: TSH: 4.524 u[IU]/mL — ABNORMAL HIGH (ref 0.350–4.500)

## 2019-03-01 LAB — FERRITIN: Ferritin: 23 ng/mL (ref 11–307)

## 2019-03-01 NOTE — Procedures (Signed)
PreOperative Dx: Cirrhosis due to NASH, ascites Postoperative Dx: Cirrhosis due to NASH, ascites Procedure:   US guided paracentesis Radiologist:  Thornton Papas Anesthesia:  10 ml of1% lidocaine Specimen:  2.53 L of yellow ascitic fluid EBL:   < 1 ml Complications: None

## 2019-03-02 LAB — AFP TUMOR MARKER: AFP, Serum, Tumor Marker: 1.9 ng/mL (ref 0.0–8.3)

## 2019-03-02 NOTE — Progress Notes (Signed)
Fluid analysis negative for SBP. No growth in cultures

## 2019-03-02 NOTE — Progress Notes (Signed)
2.53 liters removed at time of paracentesis. Does she feel better after this?

## 2019-03-02 NOTE — Progress Notes (Signed)
Patent portal vein.

## 2019-03-02 NOTE — Progress Notes (Signed)
[place] with patent portal vein, no obvious liver lesion.

## 2019-03-06 ENCOUNTER — Other Ambulatory Visit: Payer: Self-pay | Admitting: "Endocrinology

## 2019-03-06 LAB — CULTURE, BODY FLUID W GRAM STAIN -BOTTLE: Culture: NO GROWTH

## 2019-03-08 NOTE — Progress Notes (Signed)
Fluid can accumulate if not adhering to a low salt diet, or she is having decline in liver function and cardiac function. She has no evidence for portal vein thrombosis that could lead to ascites accumulation. We don't manage her diuretic therapy, so this needs to be sent to her cardiologist. From a liver standpoint, all we can do is recommend a strict low sodium diet and follow for any decompensation. Her MELD is slightly improved from this time last year (was 73 in May 2019, now 13 in 2020). This could change depending on renal and hepatic function.

## 2019-03-08 NOTE — Progress Notes (Signed)
MELD Na 13. AFP tumor marker is normal. Ferritin has dropped to 23, Hgb down to 10.8 from prior measurement in Jan (12.5). I'm copying Francene Finders, NP, who will be seeing her in September.

## 2019-04-07 ENCOUNTER — Other Ambulatory Visit: Payer: Self-pay | Admitting: "Endocrinology

## 2019-04-28 ENCOUNTER — Other Ambulatory Visit: Payer: Self-pay

## 2019-04-28 ENCOUNTER — Encounter: Payer: Self-pay | Admitting: Gastroenterology

## 2019-04-28 ENCOUNTER — Ambulatory Visit (INDEPENDENT_AMBULATORY_CARE_PROVIDER_SITE_OTHER): Payer: Medicare Other | Admitting: Gastroenterology

## 2019-04-28 VITALS — BP 135/69 | HR 88 | Temp 96.8°F | Ht 61.0 in | Wt 225.6 lb

## 2019-04-28 DIAGNOSIS — K7460 Unspecified cirrhosis of liver: Secondary | ICD-10-CM

## 2019-04-28 DIAGNOSIS — D500 Iron deficiency anemia secondary to blood loss (chronic): Secondary | ICD-10-CM

## 2019-04-28 DIAGNOSIS — K529 Noninfective gastroenteritis and colitis, unspecified: Secondary | ICD-10-CM

## 2019-04-28 DIAGNOSIS — R188 Other ascites: Secondary | ICD-10-CM | POA: Diagnosis not present

## 2019-04-28 MED ORDER — DIPHENOXYLATE-ATROPINE 2.5-0.025 MG PO TABS: ORAL_TABLET | ORAL | 1 refills | Status: DC

## 2019-04-28 NOTE — Progress Notes (Signed)
Referring Provider: Vidal Schwalbe, MD Primary Care Physician:  Vidal Schwalbe, MD Primary GI: Dr. Gala Romney   Chief Complaint  Patient presents with  . Cirrhosis    doing ok    HPI:   Penny Martinez is a 68 y.o. female presenting today with a history of NASH cirrhosis, chronic diarrhea, IDA followed by Hematology. She has not been able to remain on non-selective beta blocker due to worsening chronic kidney disease. EGD Jan 2020 with Grade 2 esophageal varices, portal gastropathy, normal first and second duodenum. Status post banding X 5. Surveillance EGD in 1 year (Jan 2021). CT earlier this year without contrast: Focal wall thickening in mid transverse colon with minimal pericolonic stranding noted, non-specific and possible related to under distension but unable to exclude small area of inflammation, colonic lesions. Small fat-containing ventral hernia. Needs non-contrast CT in Dec 2020 due to small right lung base nodular structures.She had wanted to hold off on colonoscopy at last visit. She declines again this visit due to several other health issues.  At last visit was dealing with worsening edema. US doppler with patent portal vein. June 2020 Para with 2.53 liters removed. AFP tumor marker normal. MELD Na 13. Historically diuretics managed by cardiology. Undergoing cardiac evaluation. Right-sided volume overloaded on physical exam with cardiologist several days ago: diuretics changed from lasix 80 mg daily to torsemide 40 mg daily. Undergoing dobutamine stress echo in near future.   Chronic left-sided pain comes and goes. Feels different in stomach. Not a pain but a "feeling". No N/V. Some pedal edema. Feels swollen again in abdomen. Doesn't like salt. No overt GI bleeding. Feels tired. Hgb drifting down. Known IDA with ferritin dropping. Upcoming Hematology appt.   Wonders if she would be a candidate for liver transplantation.    Past Medical History:  Diagnosis Date  . Acid  reflux   . Anemia   . Arthritis   . C. difficile colitis June/July 2015  . Cancer ([place])    skin cancer  . CHF (congestive heart failure) ([place])   . Cirrhosis ([place])    likely due to NASH. Negative viral markers 2015  . Complication of anesthesia   . COPD (chronic obstructive pulmonary disease) ([place])   . Diabetes mellitus without complication ([place])   . GERD (gastroesophageal reflux disease)   . History of kidney stones   . Hypercholesteremia   . Hypertension   . Hypothyroidism   . IBS (irritable bowel syndrome)   . Kidney disease   . Liver disease   . Neuropathy   . PONV (postoperative nausea and vomiting)   . Sleep apnea   . Thyroid disease     Past Surgical History:  Procedure Laterality Date  . ABDOMINAL HYSTERECTOMY    . APPENDECTOMY    . CAROTID ARTERY - SUBCLAVIAN ARTERY BYPASS GRAFT Right   . CARPAL TUNNEL RELEASE Right   . CARPAL TUNNEL RELEASE Left 12/03/2016   Procedure: CARPAL TUNNEL RELEASE;  Surgeon: Carole Civil, MD;  Location: [facility];  Service: Orthopedics;  Laterality: Left;  . CATARACT EXTRACTION Bilateral   . CHOLECYSTECTOMY    . COLONOSCOPY N/A 02/19/2014   Dr. Gala Romney: rectal varices, rectal polyp overlying a varix non-manipulated  . ESOPHAGEAL BANDING N/A 08/15/2018   Procedure: ESOPHAGEAL BANDING;  Surgeon: Daneil Dolin, MD;  Location: [facility];  Service: Endoscopy;  Laterality: N/A;  . ESOPHAGEAL BANDING N/A 09/15/2018   Procedure: ESOPHAGEAL BANDING;  Surgeon: Daneil Dolin, MD;  Location:  [facility];  Service: Endoscopy;  Laterality: N/A;  . ESOPHAGOGASTRODUODENOSCOPY N/A 02/19/2014   Dr. Gala Romney: 4 columns of grade 2 esophageal varices, multiple gastric polyps with largest biopsied and hyperplastic. Negative H.pylori  . ESOPHAGOGASTRODUODENOSCOPY N/A 04/13/2018   Dr. Gala Romney: Grade 3 esophageal varices, no esophagitis, portal gastropathy, multiple hyperplastic appearing polyps  . ESOPHAGOGASTRODUODENOSCOPY (EGD) WITH PROPOFOL N/A 08/15/2018    Procedure: ESOPHAGOGASTRODUODENOSCOPY (EGD) WITH PROPOFOL;  Surgeon: Daneil Dolin, MD;  Location: [facility];  Service: Endoscopy;  Laterality: N/A;  1:45pm  . ESOPHAGOGASTRODUODENOSCOPY (EGD) WITH PROPOFOL N/A 09/15/2018   Dr. Gala Romney: Grade 2 esophageal varices, portal gastropathy, normal first and second duodenum, s/p banding X 5. Surveillance in 1 year  . GIVENS CAPSULE STUDY N/A 05/04/2014   multiple polypoid-like lesions throughout small bowel, scattered superficial non-bleeding erosions more distal bowel, referred to [facility] for enteroscopy. Capsule study not complete, poor images.   Marland Kitchen TRIGGER FINGER RELEASE Left 12/03/2016   Procedure: RELEASE TRIGGER FINGER/A-1 PULLEY LEFT LONG TRIGGER FINGER RELEASE;  Surgeon: Carole Civil, MD;  Location: [facility];  Service: Orthopedics;  Laterality: Left;  . urosepsis      Current Outpatient Medications  Medication Sig Dispense Refill  . amitriptyline (ELAVIL) 25 MG tablet Take 25 mg by mouth at bedtime.    . Azelastine HCl 137 MCG/SPRAY SOLN Place 1 spray into both nostrils as needed (for congestion).   2  . benazepril (LOTENSIN) 20 MG tablet Take 20 mg by mouth daily.  3  . Continuous Blood Gluc Sensor (FREESTYLE LIBRE 14 DAY SENSOR) MISC 1 each by Does not apply route every 14 (fourteen) days. 6 each 2  . CRANBERRY PO Take 15,000 mg by mouth at bedtime.     . diphenhydrAMINE (SOMINEX) 25 MG tablet Take 50 mg by mouth at bedtime.     . diphenoxylate-atropine (LOMOTIL) 2.5-0.025 MG tablet 1-2 tablets every 4-6 hours as needed for diarrhea. (Patient taking differently: Take 1-2 tablets by mouth 4 (four) times daily as needed for diarrhea or loose stools. 1-2 tablets every 4-6 hours as needed for diarrhea.) 120 tablet 1  . donepezil (ARICEPT) 5 MG tablet Take 5 mg by mouth at bedtime.     Marland Kitchen ezetimibe (ZETIA) 10 MG tablet Take 10 mg by mouth every morning.     . gabapentin (NEURONTIN) 300 MG capsule Take 300 mg by mouth at bedtime.    Marland Kitchen HUMULIN R  U-500 KWIKPEN 500 UNIT/ML kwikpen INJECT 80 UNITS INTO THE SKIN 3 TIMES DAILY WITH MEALS. 18 mL 0  . lansoprazole (PREVACID) 30 MG capsule Take 1 capsule (30 mg total) by mouth 2 (two) times daily before a meal. (Patient taking differently: Take 30 mg by mouth daily. ) 60 capsule 11  . levothyroxine (SYNTHROID) 100 MCG tablet TAKE 1 TABLET BY MOUTH ONCE DAILY. 90 tablet 0  . Multiple Vitamins-Minerals (CENTRUM SILVER ADULT 50+ PO) Take 1 tablet by mouth daily.    . ondansetron (ZOFRAN) 4 MG tablet Take 1 tablet (4 mg total) by mouth 4 (four) times daily -  before meals and at bedtime. (Patient taking differently: Take 4 mg by mouth as needed. ) 120 tablet 1  . torsemide (DEMADEX) 20 MG tablet Take 2 tablets by mouth daily.    Marland Kitchen VICTOZA 18 MG/3ML SOPN INJECT 1.8MG SUBCUTANEOUSLY ONCE DAILY. 27 mL 0  . Vitamin D, Ergocalciferol, (DRISDOL) 50000 units CAPS capsule Take 50,000 Units by mouth every 30 (thirty) days.   1  . furosemide (LASIX)  80 MG tablet Take 80 mg by mouth every morning.     . gabapentin (NEURONTIN) 100 MG capsule TAKE (1) CAPSULE BY MOUTH THREE TIMES DAILY (Patient not taking: Reported on 04/28/2019) 90 capsule 2   Current Facility-Administered Medications  Medication Dose Route Frequency Provider Last Rate Last Dose  . albumin human 25 % solution 0-100 g  0-100 g Intravenous Once Annitta Needs, NP        Allergies as of 04/28/2019 - Review Complete 04/28/2019  Allergen Reaction Noted  . Erythromycin Hives 02/15/2014  . Niacin Rash and Shortness Of Breath 03/05/2014  . Penicillins Shortness Of Breath and Other (See Comments) 02/15/2014  . Sulfa antibiotics Hives 02/15/2014  . Ciprofloxacin Itching and Nausea And Vomiting 05/23/2018  . Statins Other (See Comments) 03/05/2014  . Codeine Itching 11/26/2016    Family History  Problem Relation Age of Onset  . Diabetes Mother   . Hypertension Mother   . Kidney failure Mother   . Blindness Mother   . Aneurysm Father   . Colon  cancer Neg Hx     Social History   Socioeconomic History  . Marital status: Married    Spouse name: Not on file  . Number of children: Not on file  . Years of education: Not on file  . Highest education level: Not on file  Occupational History  . Not on file  Social Needs  . Financial resource strain: Not on file  . Food insecurity    Worry: Not on file    Inability: Not on file  . Transportation needs    Medical: Not on file    Non-medical: Not on file  Tobacco Use  . Smoking status: Former Smoker    Packs/day: 2.00    Years: 35.00    Pack years: 70.00    Quit date: 05/05/2003    Years since quitting: 15.9  . Smokeless tobacco: Never Used  . Tobacco comment: Quit smoking 11 years ago  Substance and Sexual Activity  . Alcohol use: No  . Drug use: No  . Sexual activity: Not Currently    Birth control/protection: Surgical  Lifestyle  . Physical activity    Days per week: Not on file    Minutes per session: Not on file  . Stress: Not on file  Relationships  . Social Herbalist on phone: Not on file    Gets together: Not on file    Attends religious service: Not on file    Active member of club or organization: Not on file    Attends meetings of clubs or organizations: Not on file    Relationship status: Not on file  Other Topics Concern  . Not on file  Social History Narrative  . Not on file    Review of Systems: Gen: see HPI CV: Denies chest pain, palpitations, syncope, peripheral edema, and claudication. Resp: Denies dyspnea at rest, cough, wheezing, coughing up blood, and pleurisy. GI: see HPI  Derm: Denies rash, itching, dry skin Psych: Denies depression, anxiety, memory loss, confusion. No homicidal or suicidal ideation.  Heme: Denies bruising, bleeding, and enlarged lymph nodes.  Physical Exam: BP 135/69   Pulse 88   Temp (!) 96.8 F (36 C) (Temporal)   Ht 5' 1"  (1.549 m)   Wt 225 lb 9.6 oz (102.3 kg)   BMI 42.63 kg/m  General:    Alert and oriented. No distress noted. Pleasant and cooperative.  Head:  Normocephalic and atraumatic.  Eyes:  Conjuctiva clear without scleral icterus. Abdomen:  +BS, obese, tense ascites, left sided abdomen larger than right Msk:  Symmetrical without gross deformities. Normal posture. Extremities:  With 1+ pedal/ankle edema Neurologic:  Alert and  oriented x4 Psych:  Alert and cooperative. Normal mood and affect.  Lab Results  Component Value Date   WBC 5.5 05/01/2019   HGB 9.5 (L) 05/01/2019   HCT 32.6 (L) 05/01/2019   MCV 82.5 05/01/2019   PLT 133 (L) 05/01/2019   Lab Results  Component Value Date   IRON 70 01/19/2018   TIBC 333 01/19/2018   FERRITIN 17 05/01/2019   Lab Results  Component Value Date   CREATININE 1.02 (H) 05/01/2019   BUN 18 05/01/2019   NA 133 (L) 05/01/2019   K 3.8 05/01/2019   CL 93 (L) 05/01/2019   CO2 31 05/01/2019   Lab Results  Component Value Date   ALT 18 05/01/2019   AST 34 05/01/2019   ALKPHOS 113 05/01/2019   BILITOT 1.0 05/01/2019

## 2019-04-28 NOTE — Patient Instructions (Signed)
We have ordered another paracentesis for you. On that day, don't take the torsemide. You can resume it the next day.   We will see you in January 2021!  Please call me with any changes or concerns.  I enjoyed seeing you again today! As you know, I value our relationship and want to provide genuine, compassionate, and quality care. I welcome your feedback. If you receive a survey regarding your visit,  I greatly appreciate you taking time to fill this out. See you next time!  Annitta Needs, PhD, ANP-BC [facility]

## 2019-05-01 ENCOUNTER — Encounter: Payer: Self-pay | Admitting: Internal Medicine

## 2019-05-01 ENCOUNTER — Ambulatory Visit (HOSPITAL_COMMUNITY)
Admission: RE | Admit: 2019-05-01 | Discharge: 2019-05-01 | Disposition: A | Discharge status: Home / Self Care | Payer: Medicare Other | Source: Ambulatory Visit | Attending: Gastroenterology | Admitting: Gastroenterology

## 2019-05-01 ENCOUNTER — Other Ambulatory Visit: Payer: Self-pay

## 2019-05-01 ENCOUNTER — Inpatient Hospital Stay (HOSPITAL_COMMUNITY): Payer: Medicare Other | Attending: Internal Medicine

## 2019-05-01 DIAGNOSIS — Z79899 Other long term (current) drug therapy: Secondary | ICD-10-CM | POA: Insufficient documentation

## 2019-05-01 DIAGNOSIS — R188 Other ascites: Secondary | ICD-10-CM | POA: Diagnosis present

## 2019-05-01 DIAGNOSIS — E039 Hypothyroidism, unspecified: Secondary | ICD-10-CM | POA: Diagnosis not present

## 2019-05-01 DIAGNOSIS — D696 Thrombocytopenia, unspecified: Secondary | ICD-10-CM | POA: Diagnosis not present

## 2019-05-01 DIAGNOSIS — D508 Other iron deficiency anemias: Secondary | ICD-10-CM | POA: Diagnosis present

## 2019-05-01 DIAGNOSIS — D500 Iron deficiency anemia secondary to blood loss (chronic): Secondary | ICD-10-CM

## 2019-05-01 DIAGNOSIS — Z90710 Acquired absence of both cervix and uterus: Secondary | ICD-10-CM | POA: Insufficient documentation

## 2019-05-01 DIAGNOSIS — I10 Essential (primary) hypertension: Secondary | ICD-10-CM | POA: Insufficient documentation

## 2019-05-01 DIAGNOSIS — K7460 Unspecified cirrhosis of liver: Secondary | ICD-10-CM | POA: Diagnosis not present

## 2019-05-01 LAB — CBC WITH DIFFERENTIAL/PLATELET
Abs Immature Granulocytes: 0.05 10*3/uL (ref 0.00–0.07)
Basophils Absolute: 0 10*3/uL (ref 0.0–0.1)
Basophils Relative: 0 %
Eosinophils Absolute: 0.1 10*3/uL (ref 0.0–0.5)
Eosinophils Relative: 1 %
HCT: 32.6 % — ABNORMAL LOW (ref 36.0–46.0)
Hemoglobin: 9.5 g/dL — ABNORMAL LOW (ref 12.0–15.0)
Immature Granulocytes: 1 %
Lymphocytes Relative: 12 %
Lymphs Abs: 0.7 10*3/uL (ref 0.7–4.0)
MCH: 24.1 pg — ABNORMAL LOW (ref 26.0–34.0)
MCHC: 29.1 g/dL — ABNORMAL LOW (ref 30.0–36.0)
MCV: 82.5 fL (ref 80.0–100.0)
Monocytes Absolute: 0.5 10*3/uL (ref 0.1–1.0)
Monocytes Relative: 9 %
Neutro Abs: 4.2 10*3/uL (ref 1.7–7.7)
Neutrophils Relative %: 77 %
Platelets: 133 10*3/uL — ABNORMAL LOW (ref 150–400)
RBC: 3.95 MIL/uL (ref 3.87–5.11)
RDW: 16.6 % — ABNORMAL HIGH (ref 11.5–15.5)
WBC: 5.5 10*3/uL (ref 4.0–10.5)
nRBC: 0 % (ref 0.0–0.2)

## 2019-05-01 LAB — COMPREHENSIVE METABOLIC PANEL
ALT: 18 U/L (ref 0–44)
AST: 34 U/L (ref 15–41)
Albumin: 2.9 g/dL — ABNORMAL LOW (ref 3.5–5.0)
Alkaline Phosphatase: 113 U/L (ref 38–126)
Anion gap: 9 (ref 5–15)
BUN: 18 mg/dL (ref 8–23)
CO2: 31 mmol/L (ref 22–32)
Calcium: 8.5 mg/dL — ABNORMAL LOW (ref 8.9–10.3)
Chloride: 93 mmol/L — ABNORMAL LOW (ref 98–111)
Creatinine, Ser: 1.02 mg/dL — ABNORMAL HIGH (ref 0.44–1.00)
GFR calc Af Amer: >60 mL/min (ref >60)
GFR calc non Af Amer: 57 mL/min — ABNORMAL LOW (ref >60)
Glucose, Bld: 233 mg/dL — ABNORMAL HIGH (ref 70–99)
Potassium: 3.8 mmol/L (ref 3.5–5.1)
Sodium: 133 mmol/L — ABNORMAL LOW (ref 135–145)
Total Bilirubin: 1 mg/dL (ref 0.3–1.2)
Total Protein: 6.6 g/dL (ref 6.5–8.1)

## 2019-05-01 LAB — BODY FLUID CELL COUNT WITH DIFFERENTIAL
Eos, Fluid: 0 %
Lymphs, Fluid: 75 %
Monocyte-Macrophage-Serous Fluid: 16 % — ABNORMAL LOW (ref 50–90)
Neutrophil Count, Fluid: 9 % (ref 0–25)
Other Cells, Fluid: 1 %
Total Nucleated Cell Count, Fluid: 509 cu mm (ref 0–1000)

## 2019-05-01 LAB — FERRITIN: Ferritin: 17 ng/mL (ref 11–307)

## 2019-05-01 LAB — GRAM STAIN: Gram Stain: NONE SEEN

## 2019-05-01 LAB — LACTATE DEHYDROGENASE: LDH: 169 U/L (ref 98–192)

## 2019-05-01 NOTE — Procedures (Signed)
PreOperative Dx: Cirrhosis, ascites Postoperative Dx: Cirrhosis, ascites Procedure:   US guided paracentesis Radiologist:  Thornton Papas Anesthesia:  10 ml of1% lidocaine Specimen:  3.7 L of cloudy yellow ascitic fluid EBL:   < 1 ml Complications: None

## 2019-05-01 NOTE — Progress Notes (Signed)
Paracentesis complete no signs of distress.

## 2019-05-02 LAB — PATHOLOGIST SMEAR REVIEW

## 2019-05-03 NOTE — Assessment & Plan Note (Signed)
At baseline. Continue with Lomotil prn.

## 2019-05-03 NOTE — Assessment & Plan Note (Signed)
68 year old very pleasant female with NASH cirrhosis and declining over past few months, with worsening edema and ascites. First para in June 2020. Patent portal vein on recent ultrasound. In setting of renal and cardiac disease, diuretic therapy difficult and managed by cardiology. Needs repeat para now. Will need EGD in Jan 2021. Discussed with local liver transplant center briefly her comorbidities and clinical status: not a transplant candidate. Return in Jan 2021 for close follow-up.

## 2019-05-03 NOTE — Assessment & Plan Note (Signed)
Follow-up with Hematology upcoming.

## 2019-05-04 ENCOUNTER — Other Ambulatory Visit: Payer: Self-pay | Admitting: "Endocrinology

## 2019-05-06 LAB — CULTURE, BODY FLUID W GRAM STAIN -BOTTLE: Culture: NO GROWTH

## 2019-05-16 ENCOUNTER — Telehealth: Payer: Self-pay | Admitting: *Deleted

## 2019-05-16 NOTE — Telephone Encounter (Signed)
Schedule para per protocol, hold diuretic therapy day of para. Albumin per protocol. Cell count and culture.

## 2019-05-16 NOTE — Telephone Encounter (Signed)
Pt was seen 04/2019. Pt had a para after her apt and says the fluid began to build up 1 week after her last para 04/27/2019. Pt's abdomen is tight, swelling, sob isn't as bad as it previously was. Pt was advised to go to the ED if her symptoms worsen during the night.

## 2019-05-16 NOTE — Telephone Encounter (Signed)
Pt says that fluid is back on her stomach.  Pt says it is tight and big.  [phone]

## 2019-05-17 ENCOUNTER — Other Ambulatory Visit: Payer: Self-pay | Admitting: *Deleted

## 2019-05-17 DIAGNOSIS — R188 Other ascites: Secondary | ICD-10-CM

## 2019-05-17 NOTE — Telephone Encounter (Signed)
Pt aware of appt details

## 2019-05-17 NOTE — Telephone Encounter (Signed)
PARA scheduled for 9/10 at 11:00am.   LMOVM for pt

## 2019-05-18 ENCOUNTER — Other Ambulatory Visit: Payer: Self-pay | Admitting: Gastroenterology

## 2019-05-18 ENCOUNTER — Telehealth: Payer: Self-pay | Admitting: Internal Medicine

## 2019-05-18 ENCOUNTER — Encounter (HOSPITAL_COMMUNITY): Payer: Self-pay | Admitting: Nurse Practitioner

## 2019-05-18 ENCOUNTER — Ambulatory Visit (HOSPITAL_COMMUNITY)
Admission: RE | Admit: 2019-05-18 | Discharge: 2019-05-18 | Disposition: A | Discharge status: Home / Self Care | Payer: Medicare Other | Source: Ambulatory Visit | Attending: Gastroenterology | Admitting: Gastroenterology

## 2019-05-18 ENCOUNTER — Inpatient Hospital Stay (HOSPITAL_COMMUNITY): Payer: Medicare Other | Attending: Internal Medicine | Admitting: Nurse Practitioner

## 2019-05-18 ENCOUNTER — Other Ambulatory Visit (HOSPITAL_COMMUNITY): Payer: Medicare Other

## 2019-05-18 ENCOUNTER — Other Ambulatory Visit: Payer: Self-pay

## 2019-05-18 DIAGNOSIS — J449 Chronic obstructive pulmonary disease, unspecified: Secondary | ICD-10-CM | POA: Insufficient documentation

## 2019-05-18 DIAGNOSIS — I10 Essential (primary) hypertension: Secondary | ICD-10-CM | POA: Insufficient documentation

## 2019-05-18 DIAGNOSIS — D696 Thrombocytopenia, unspecified: Secondary | ICD-10-CM | POA: Insufficient documentation

## 2019-05-18 DIAGNOSIS — Z85828 Personal history of other malignant neoplasm of skin: Secondary | ICD-10-CM | POA: Diagnosis not present

## 2019-05-18 DIAGNOSIS — R188 Other ascites: Secondary | ICD-10-CM

## 2019-05-18 DIAGNOSIS — D508 Other iron deficiency anemias: Secondary | ICD-10-CM | POA: Insufficient documentation

## 2019-05-18 DIAGNOSIS — Z87891 Personal history of nicotine dependence: Secondary | ICD-10-CM | POA: Insufficient documentation

## 2019-05-18 DIAGNOSIS — Z794 Long term (current) use of insulin: Secondary | ICD-10-CM | POA: Diagnosis not present

## 2019-05-18 DIAGNOSIS — Z7982 Long term (current) use of aspirin: Secondary | ICD-10-CM | POA: Insufficient documentation

## 2019-05-18 DIAGNOSIS — Z79899 Other long term (current) drug therapy: Secondary | ICD-10-CM | POA: Insufficient documentation

## 2019-05-18 DIAGNOSIS — D500 Iron deficiency anemia secondary to blood loss (chronic): Secondary | ICD-10-CM

## 2019-05-18 DIAGNOSIS — E7800 Pure hypercholesterolemia, unspecified: Secondary | ICD-10-CM | POA: Diagnosis not present

## 2019-05-18 DIAGNOSIS — G4730 Sleep apnea, unspecified: Secondary | ICD-10-CM | POA: Diagnosis not present

## 2019-05-18 DIAGNOSIS — E039 Hypothyroidism, unspecified: Secondary | ICD-10-CM | POA: Diagnosis not present

## 2019-05-18 NOTE — Assessment & Plan Note (Addendum)
1.  Iron deficiency anemia: - Secondary to chronic GI blood loss. - Hospitalized in 2015 secondary to symptomatic anemia requiring emergency transfusion. 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 - Capsule study in 2015 showed multiple polypoid likely throughout the small bowel, scattered superficial nonbleeding erosions. - Patient reported she has an intolerance to oral iron therapy. -Last treated with IV iron was on 06/30/2018. - Labs on 05/01/2019 showed her hemoglobin has dropped to 9.5, ferritin 17. -We will give her 2 infusions of IV iron. - She will follow-up in 4 months with repeat labs  2.  Thrombocytopenia: - Initially platelet count was 74. - Etiology likely due to cirrhosis and splenomegaly which was noted on a CT of abdomen and pelvis done on 03/05/2014. -Patient had a EGD done with Dr. 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  Patient also had evidence of portal hypertension. -She follows up with GI as recommended. - Labs done on 05/01/2019 showed her platelets are 133. -We will continue to monitor.  3.  NASH: -Patient is being followed by Dr. Gala Romney and nurse practitioner Roseanne Kaufman on a regular basis. -She reports she is having more frequent paracentesis.

## 2019-05-18 NOTE — Telephone Encounter (Signed)
Returned pt's call. Pt would like to know the fluid level or how much fluid was drawn off of her on 05/01/2019 at [facility].

## 2019-05-18 NOTE — Progress Notes (Signed)
Patient ID: Penny Martinez, female   DOB: 12-Mar-1951, 68 y.o.   MRN: [id number]   Pt was scheduled for paracentesis today  Just placed Free Style Libre for insulin delivery 2 days ago Discussed with Dr Thornton Papas This renders pt not a candidate for this image guided procedure  She is aware of this and the rules surrounding [place] use.

## 2019-05-18 NOTE — Progress Notes (Signed)
Penny Martinez, [place] [id number]   CLINIC:  Medical Oncology/Hematology  PCP:  Vidal Schwalbe, MD [address] [place] [address] [phone]   REASON FOR VISIT: Follow-up for iron deficiency anemia  CURRENT THERAPY: Intermittent iron infusions   INTERVAL HISTORY:  Penny Martinez 68 y.o. female returns for routine follow-up for iron deficiency anemia.  Patient reports she is feeling more fatigued.  She has some shortness of breath and headaches.  She is unable to go a full day without taking a nap.  She is being followed closely by GI due to her fatty liver.  She is also having to get paracentesis is more frequently.  She denies any bright red bleeding per rectum or melena. Denies any nausea or vomiting. Denies any new pains. Had not noticed any recent bleeding such as epistaxis, hematuria or hematochezia. Denies recent chest pain on exertion, shortness of breath on minimal exertion, pre-syncopal episodes, or palpitations. Denies any numbness or tingling in hands or feet. Denies any recent fevers, infections, or recent hospitalizations. Patient reports appetite at 50% and energy level at 25%.  She is eating well and maintaining her weight at this time.     REVIEW OF SYSTEMS:  Review of Systems  Constitutional: Positive for fatigue.  Respiratory: Positive for shortness of breath.   Cardiovascular: Positive for leg swelling.  Gastrointestinal: Positive for diarrhea.  Neurological: Positive for headaches and numbness.  Hematological: Bruises/bleeds easily.  Psychiatric/Behavioral: Positive for sleep disturbance.  All other systems reviewed and are negative.    PAST MEDICAL/SURGICAL HISTORY:  Past Medical History:  Diagnosis Date  . Acid reflux   . Anemia   . Arthritis   . C. difficile colitis June/July 2015  . Cancer ([place])    skin cancer  . CHF (congestive heart failure) ([place])   . Cirrhosis ([place])    likely due to NASH. Negative  viral markers 2015  . Complication of anesthesia   . COPD (chronic obstructive pulmonary disease) ([place])   . Diabetes mellitus without complication ([place])   . GERD (gastroesophageal reflux disease)   . History of kidney stones   . Hypercholesteremia   . Hypertension   . Hypothyroidism   . IBS (irritable bowel syndrome)   . Kidney disease   . Liver disease   . Neuropathy   . PONV (postoperative nausea and vomiting)   . Sleep apnea   . Thyroid disease    Past Surgical History:  Procedure Laterality Date  . ABDOMINAL HYSTERECTOMY    . APPENDECTOMY    . CAROTID ARTERY - SUBCLAVIAN ARTERY BYPASS GRAFT Right   . CARPAL TUNNEL RELEASE Right   . CARPAL TUNNEL RELEASE Left 12/03/2016   Procedure: CARPAL TUNNEL RELEASE;  Surgeon: Carole Civil, MD;  Location: [facility];  Service: Orthopedics;  Laterality: Left;  . CATARACT EXTRACTION Bilateral   . CHOLECYSTECTOMY    . COLONOSCOPY N/A 02/19/2014   Dr. Gala Romney: rectal varices, rectal polyp overlying a varix non-manipulated  . ESOPHAGEAL BANDING N/A 08/15/2018   Procedure: ESOPHAGEAL BANDING;  Surgeon: Daneil Dolin, MD;  Location: [facility];  Service: Endoscopy;  Laterality: N/A;  . ESOPHAGEAL BANDING N/A 09/15/2018   Procedure: ESOPHAGEAL BANDING;  Surgeon: Daneil Dolin, MD;  Location: [facility];  Service: Endoscopy;  Laterality: N/A;  . ESOPHAGOGASTRODUODENOSCOPY N/A 02/19/2014   Dr. Gala Romney: 4 columns of grade 2 esophageal varices, multiple gastric polyps with largest biopsied and hyperplastic. Negative H.pylori  .  ESOPHAGOGASTRODUODENOSCOPY N/A 04/13/2018   Dr. Gala Romney: Grade 3 esophageal varices, no esophagitis, portal gastropathy, multiple hyperplastic appearing polyps  . ESOPHAGOGASTRODUODENOSCOPY (EGD) WITH PROPOFOL N/A 08/15/2018   Procedure: ESOPHAGOGASTRODUODENOSCOPY (EGD) WITH PROPOFOL;  Surgeon: Daneil Dolin, MD;  Location: [facility];  Service: Endoscopy;  Laterality: N/A;  1:45pm  . ESOPHAGOGASTRODUODENOSCOPY (EGD) WITH  PROPOFOL N/A 09/15/2018   Dr. Gala Romney: Grade 2 esophageal varices, portal gastropathy, normal first and second duodenum, s/p banding X 5. Surveillance in 1 year  . GIVENS CAPSULE STUDY N/A 05/04/2014   multiple polypoid-like lesions throughout small bowel, scattered superficial non-bleeding erosions more distal bowel, referred to [facility] for enteroscopy. Capsule study not complete, poor images.   Marland Kitchen TRIGGER FINGER RELEASE Left 12/03/2016   Procedure: RELEASE TRIGGER FINGER/A-1 PULLEY LEFT LONG TRIGGER FINGER RELEASE;  Surgeon: Carole Civil, MD;  Location: [facility];  Service: Orthopedics;  Laterality: Left;  . urosepsis       SOCIAL HISTORY:  Social History   Socioeconomic History  . Marital status: Married    Spouse name: Not on file  . Number of children: Not on file  . Years of education: Not on file  . Highest education level: Not on file  Occupational History  . Not on file  Social Needs  . Financial resource strain: Not on file  . Food insecurity    Worry: Not on file    Inability: Not on file  . Transportation needs    Medical: Not on file    Non-medical: Not on file  Tobacco Use  . Smoking status: Former Smoker    Packs/day: 2.00    Years: 35.00    Pack years: 70.00    Quit date: 05/05/2003    Years since quitting: 16.0  . Smokeless tobacco: Never Used  . Tobacco comment: Quit smoking 11 years ago  Substance and Sexual Activity  . Alcohol use: No  . Drug use: No  . Sexual activity: Not Currently    Birth control/protection: Surgical  Lifestyle  . Physical activity    Days per week: Not on file    Minutes per session: Not on file  . Stress: Not on file  Relationships  . Social Herbalist on phone: Not on file    Gets together: Not on file    Attends religious service: Not on file    Active member of club or organization: Not on file    Attends meetings of clubs or organizations: Not on file    Relationship status: Not on file  . Intimate partner  violence    Fear of current or ex partner: Not on file    Emotionally abused: Not on file    Physically abused: Not on file    Forced sexual activity: Not on file  Other Topics Concern  . Not on file  Social History Narrative  . Not on file    FAMILY HISTORY:  Family History  Problem Relation Age of Onset  . Diabetes Mother   . Hypertension Mother   . Kidney failure Mother   . Blindness Mother   . Aneurysm Father   . Colon cancer Neg Hx     CURRENT MEDICATIONS:  Outpatient Encounter Medications as of 05/18/2019  Medication Sig  . amitriptyline (ELAVIL) 25 MG tablet Take 25 mg by mouth at bedtime.  Marland Kitchen aspirin EC 81 MG tablet Take by mouth.  . benazepril (LOTENSIN) 20 MG tablet Take 20 mg by mouth daily.  Marland Kitchen  cephALEXin (KEFLEX) 500 MG capsule Take 500 mg by mouth 2 (two) times daily.  . Continuous Blood Gluc Sensor (FREESTYLE LIBRE 14 DAY SENSOR) MISC 1 each by Does not apply route every 14 (fourteen) days.  Marland Kitchen CRANBERRY PO Take 15,000 mg by mouth at bedtime.   . donepezil (ARICEPT) 5 MG tablet Take 5 mg by mouth at bedtime.   Marland Kitchen ezetimibe (ZETIA) 10 MG tablet Take 10 mg by mouth every morning.   . gabapentin (NEURONTIN) 300 MG capsule Take 300 mg by mouth at bedtime.  Marland Kitchen HUMULIN R U-500 KWIKPEN 500 UNIT/ML kwikpen INJECT 80 UNITS INTO THE SKIN 3 TIMES DAILY WITH MEALS.  Marland Kitchen lansoprazole (PREVACID) 30 MG capsule Take 1 capsule (30 mg total) by mouth 2 (two) times daily before a meal. (Patient taking differently: Take 30 mg by mouth daily. )  . levofloxacin (LEVAQUIN) 500 MG tablet TK 1 T PO QD FOR 10 DAYS  . levothyroxine (SYNTHROID) 100 MCG tablet TAKE 1 TABLET BY MOUTH ONCE DAILY.  . Multiple Vitamins-Minerals (CENTRUM SILVER ADULT 50+ PO) Take 1 tablet by mouth daily.  Marland Kitchen torsemide (DEMADEX) 20 MG tablet Take 2 tablets by mouth daily.  Marland Kitchen VICTOZA 18 MG/3ML SOPN INJECT 1.8MG SUBCUTANEOUSLY ONCE DAILY.  Marland Kitchen Vitamin D, Ergocalciferol, (DRISDOL) 50000 units CAPS capsule Take 50,000 Units by  mouth every 30 (thirty) days.   . [DISCONTINUED] gabapentin (NEURONTIN) 100 MG capsule TAKE (1) CAPSULE BY MOUTH THREE TIMES DAILY  . Azelastine HCl 137 MCG/SPRAY SOLN Place 1 spray into both nostrils as needed (for congestion).   . diphenhydrAMINE (SOMINEX) 25 MG tablet Take 50 mg by mouth at bedtime.   . diphenoxylate-atropine (LOMOTIL) 2.5-0.025 MG tablet 1-2 tablets every 4-6 hours as needed for diarrhea. (Patient not taking: Reported on 05/18/2019)  . ondansetron (ZOFRAN) 4 MG tablet Take 1 tablet (4 mg total) by mouth 4 (four) times daily -  before meals and at bedtime. (Patient not taking: Reported on 05/18/2019)   Facility-Administered Encounter Medications as of 05/18/2019  Medication  . albumin human 25 % solution 0-100 g    ALLERGIES:  Allergies  Allergen Reactions  . Erythromycin Hives  . Niacin Rash and Shortness Of Breath  . Penicillins Shortness Of Breath and Other (See Comments)    Has patient had a PCN reaction causing immediate rash, facial/tongue/throat swelling, SOB or lightheadedness with hypotension: Yes Has patient had a PCN reaction causing severe rash involving mucus membranes or skin necrosis: No Has patient had a PCN reaction that required hospitalization: No Has patient had a PCN reaction occurring within the last 10 years: No If all of the above answers are "NO", then may proceed with Cephalosporin use.   . Sulfa Antibiotics Hives  . Ciprofloxacin Itching and Nausea And Vomiting    Stomach pain  . Statins Other (See Comments)    Muscle Pain  . Codeine Itching  . Fluticasone Rash  . Tramadol Rash     PHYSICAL EXAM:  ECOG Performance status: 1  Vitals:   05/18/19 1357  BP: (!) 103/39  Pulse: 93  Resp: (!) 22  Temp: 97.8 F (36.6 C)  SpO2: 92%   Filed Weights   05/18/19 1357  Weight: 225 lb 14.4 oz (102.5 kg)    Physical Exam Constitutional:      Appearance: Normal appearance. She is normal weight.  Cardiovascular:     Rate and Rhythm:  Normal rate and regular rhythm.     Heart sounds: Normal heart sounds.  Pulmonary:  Effort: Pulmonary effort is normal.     Breath sounds: Normal breath sounds.  Abdominal:     General: Bowel sounds are normal.     Palpations: Abdomen is soft.  Musculoskeletal: Normal range of motion.  Skin:    General: Skin is warm and dry.  Neurological:     Mental Status: She is alert and oriented to person, place, and time. Mental status is at baseline.  Psychiatric:        Mood and Affect: Mood normal.        Behavior: Behavior normal.        Thought Content: Thought content normal.        Judgment: Judgment normal.      LABORATORY DATA:  I have reviewed the labs as listed.  CBC    Component Value Date/Time   WBC 5.5 05/01/2019 1351   RBC 3.95 05/01/2019 1351   HGB 9.5 (L) 05/01/2019 1351   HCT 32.6 (L) 05/01/2019 1351   PLT 133 (L) 05/01/2019 1351   MCV 82.5 05/01/2019 1351   MCH 24.1 (L) 05/01/2019 1351   MCHC 29.1 (L) 05/01/2019 1351   RDW 16.6 (H) 05/01/2019 1351   LYMPHSABS 0.7 05/01/2019 1351   MONOABS 0.5 05/01/2019 1351   EOSABS 0.1 05/01/2019 1351   BASOSABS 0.0 05/01/2019 1351   CMP Latest Ref Rng & Units 05/01/2019 03/01/2019 01/12/2019  Glucose 70 - 99 mg/dL 233(H) 319(H) 167(H)  BUN 8 - 23 mg/dL 18 17 13   Creatinine 0.44 - 1.00 mg/dL 1.02(H) 1.14(H) 0.97  Sodium 135 - 145 mmol/L 133(L) 137 137  Potassium 3.5 - 5.1 mmol/L 3.8 4.5 4.3  Chloride 98 - 111 mmol/L 93(L) 95(L) 99  CO2 22 - 32 mmol/L 31 29 31   Calcium 8.9 - 10.3 mg/dL 8.5(L) 8.4(L) 8.6  Total Protein 6.5 - 8.1 g/dL 6.6 6.5 6.0(L)  Total Bilirubin 0.3 - 1.2 mg/dL 1.0 1.4(H) 0.8  Alkaline Phos 38 - 126 U/L 113 125 -  AST 15 - 41 U/L 34 41 35  ALT 0 - 44 U/L 18 20 14      I personally performed a face-to-face visit.  All questions were answered to patient's stated satisfaction. Encouraged patient to call with any new concerns or questions before his next visit to the cancer center and we can certain see  him sooner, if needed.     ASSESSMENT & PLAN:   Iron deficiency anemia due to chronic blood loss 1.  Iron deficiency anemia: - Secondary to chronic GI blood loss. - Hospitalized in 2015 secondary to symptomatic anemia requiring emergency transfusion. 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 - Capsule study in 2015 showed multiple polypoid likely throughout the small bowel, scattered superficial nonbleeding erosions. - Patient reported she has an intolerance to oral iron therapy. -Last treated with IV iron was on 06/30/2018. - Labs on 05/01/2019 showed her hemoglobin has dropped to 9.5, ferritin 17. -We will give her 2 infusions of IV iron. - She will follow-up in 4 months with repeat labs  2.  Thrombocytopenia: - Initially platelet count was 74. - Etiology likely due to cirrhosis and splenomegaly which was noted on a CT of abdomen and pelvis done on 03/05/2014. -Patient had a EGD done with Dr. 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  Patient also had evidence of portal hypertension. -She follows up with GI as recommended. - Labs done on 05/01/2019 showed her platelets are 133. -We will continue to monitor.  3.  NASH: -Patient is being followed by Dr. Gala Romney and nurse practitioner Roseanne Kaufman on a regular basis. -She reports she is having more frequent paracentesis.      Orders placed this encounter:  Orders Placed This Encounter  Procedures  . Lactate dehydrogenase  . CBC with Differential/Platelet  . Comprehensive metabolic panel  . Ferritin  . Iron and TIBC  . Vitamin B12  . VITAMIN D 25 Hydroxy (Vit-D Deficiency, Fractures)  . Folate      Francene Finders, FNP-C [place] [phone]

## 2019-05-18 NOTE — Telephone Encounter (Signed)
Pt said she was returning a call. [phone]

## 2019-05-18 NOTE — Patient Instructions (Signed)
[facility] at [facility] Discharge Instructions  Follow up in 4 months with labs    Thank you for choosing [facility] at [facility] to provide your oncology and hematology care.  To afford each patient quality time with our provider, please arrive at least 15 minutes before your scheduled appointment time.   If you have a lab appointment with the Cancer Center please come in thru the Main Entrance and check in at the main information desk.  You need to re-schedule your appointment should you arrive 10 or more minutes late.  We strive to give you quality time with our providers, and arriving late affects you and other patients whose appointments are after yours.  Also, if you no show three or more times for appointments you may be dismissed from the clinic at the providers discretion.     Again, thank you for choosing [facility].  Our hope is that these requests will decrease the amount of time that you wait before being seen by our physicians.       _____________________________________________________________  Should you have questions after your visit to [facility], please contact our office at [phone] between the hours of 8:00 a.m. and 4:30 p.m.  Voicemails left after 4:00 p.m. will not be returned until the following business day.  For prescription refill requests, have your pharmacy contact our office and allow 72 hours.    Due to Covid, you will need to wear a mask upon entering the hospital. If you do not have a mask, a mask will be given to you at the Main Entrance upon arrival. For doctor visits, patients may have 1 support person with them. For treatment visits, patients can not have anyone with them due to social distancing guidelines and our immunocompromised population.      

## 2019-05-19 NOTE — Telephone Encounter (Signed)
Pt notified of the amount of fluid removed.

## 2019-05-19 NOTE — Telephone Encounter (Signed)
She had 3.7 liters removed.

## 2019-05-22 ENCOUNTER — Inpatient Hospital Stay (HOSPITAL_COMMUNITY): Payer: Medicare Other

## 2019-05-22 ENCOUNTER — Encounter (HOSPITAL_COMMUNITY): Payer: Self-pay

## 2019-05-22 ENCOUNTER — Other Ambulatory Visit: Payer: Self-pay

## 2019-05-22 VITALS — BP 146/77 | HR 80 | Temp 97.7°F | Resp 18

## 2019-05-22 DIAGNOSIS — D500 Iron deficiency anemia secondary to blood loss (chronic): Secondary | ICD-10-CM

## 2019-05-22 DIAGNOSIS — D508 Other iron deficiency anemias: Secondary | ICD-10-CM | POA: Diagnosis not present

## 2019-05-22 MED ORDER — SODIUM CHLORIDE 0.9 % IV SOLN
510.0000 mg | Freq: Once | INTRAVENOUS | Status: AC
  Administered 2019-05-22: 510 mg via INTRAVENOUS
  Filled 2019-05-22: qty 510

## 2019-05-22 MED ORDER — SODIUM CHLORIDE 0.9 % IV SOLN
INTRAVENOUS | Status: DC
  Administered 2019-05-22: 15:00:00 via INTRAVENOUS

## 2019-05-22 NOTE — Progress Notes (Signed)
Patient tolerated iron infusion with no complaints voiced.  Peripheral IV site clean and dry with good blood return noted before and after infusion.  Band aid applied.  VSS with discharge and left ambulatory with no s/s of distress noted.

## 2019-05-23 ENCOUNTER — Ambulatory Visit (HOSPITAL_COMMUNITY): Payer: Medicare Other

## 2019-05-30 ENCOUNTER — Other Ambulatory Visit: Payer: Self-pay

## 2019-05-30 ENCOUNTER — Inpatient Hospital Stay (HOSPITAL_COMMUNITY): Payer: Medicare Other

## 2019-05-30 VITALS — BP 151/66 | HR 86 | Temp 97.6°F | Resp 18

## 2019-05-30 DIAGNOSIS — D508 Other iron deficiency anemias: Secondary | ICD-10-CM | POA: Diagnosis not present

## 2019-05-30 DIAGNOSIS — D500 Iron deficiency anemia secondary to blood loss (chronic): Secondary | ICD-10-CM

## 2019-05-30 MED ORDER — SODIUM CHLORIDE 0.9 % IV SOLN
510.0000 mg | Freq: Once | INTRAVENOUS | Status: AC
  Administered 2019-05-30: 510 mg via INTRAVENOUS
  Filled 2019-05-30: qty 510

## 2019-05-30 MED ORDER — SODIUM CHLORIDE 0.9 % IV SOLN
INTRAVENOUS | Status: DC
  Administered 2019-05-30: 15:00:00 via INTRAVENOUS

## 2019-05-30 NOTE — Progress Notes (Signed)
Penny Martinez presents today for IV iron infusion. Infusion tolerated without incident or complaint. See MAR for details. VSS prior to and post infusion. Discharged in satisfactory condition with follow up instructions.

## 2019-05-30 NOTE — Patient Instructions (Signed)
[facility] at [facility]  Discharge Instructions:  You received an iron infusion today.  _______________________________________________________________  Thank you for choosing [facility] at [facility] to provide your oncology and hematology care.  To afford each patient quality time with our providers, please arrive at least 15 minutes before your scheduled appointment.  You need to re-schedule your appointment if you arrive 10 or more minutes late.  We strive to give you quality time with our providers, and arriving late affects you and other patients whose appointments are after yours.  Also, if you no show three or more times for appointments you may be dismissed from the clinic.  Again, thank you for choosing [facility] at [facility]. Our hope is that these requests will allow you access to exceptional care and in a timely manner. _______________________________________________________________  If you have questions after your visit, please contact our office at [phone] between the hours of 8:30 a.m. and 5:00 p.m. Voicemails left after 4:30 p.m. will not be returned until the following business day. _______________________________________________________________  For prescription refill requests, have your pharmacy contact our office. _______________________________________________________________  Recommendations made by the consultant and any test results will be sent to your referring physician. _______________________________________________________________ 

## 2019-05-31 ENCOUNTER — Other Ambulatory Visit: Payer: Self-pay | Admitting: "Endocrinology

## 2019-05-31 ENCOUNTER — Encounter (HOSPITAL_COMMUNITY): Payer: Self-pay

## 2019-05-31 ENCOUNTER — Ambulatory Visit (HOSPITAL_COMMUNITY)
Admission: RE | Admit: 2019-05-31 | Discharge: 2019-05-31 | Disposition: A | Discharge status: Home / Self Care | Payer: Medicare Other | Source: Ambulatory Visit | Attending: Gastroenterology | Admitting: Gastroenterology

## 2019-05-31 DIAGNOSIS — R188 Other ascites: Secondary | ICD-10-CM | POA: Insufficient documentation

## 2019-05-31 LAB — GRAM STAIN: Gram Stain: NONE SEEN

## 2019-05-31 LAB — BODY FLUID CELL COUNT WITH DIFFERENTIAL
Eos, Fluid: 0 %
Lymphs, Fluid: 82 %
Monocyte-Macrophage-Serous Fluid: 12 % — ABNORMAL LOW (ref 50–90)
Neutrophil Count, Fluid: 6 % (ref 0–25)
Total Nucleated Cell Count, Fluid: 313 cu mm (ref 0–1000)

## 2019-05-31 NOTE — Progress Notes (Signed)
Paracentesis complete no signs of distress.

## 2019-05-31 NOTE — Procedures (Signed)
   US guided LLQ paracentesis  3.6 L yellow fluid Labs per MD  Tolerated well

## 2019-06-01 LAB — COMPLETE METABOLIC PANEL WITH GFR
AG Ratio: 1 (calc) (ref 1.0–2.5)
ALT: 13 U/L (ref 6–29)
AST: 29 U/L (ref 10–35)
Albumin: 3.1 g/dL — ABNORMAL LOW (ref 3.6–5.1)
Alkaline phosphatase (APISO): 134 U/L (ref 37–153)
BUN/Creatinine Ratio: 13 (calc) (ref 6–22)
BUN: 14 mg/dL (ref 7–25)
CO2: 30 mmol/L (ref 20–32)
Calcium: 8.8 mg/dL (ref 8.6–10.4)
Chloride: 98 mmol/L (ref 98–110)
Creat: 1.06 mg/dL — ABNORMAL HIGH (ref 0.50–0.99)
GFR, Est African American: 63 mL/min/{1.73_m2} (ref >=60)
GFR, Est Non African American: 54 mL/min/{1.73_m2} — ABNORMAL LOW (ref >=60)
Globulin: 3.2 g/dL (calc) (ref 1.9–3.7)
Glucose, Bld: 122 mg/dL — ABNORMAL HIGH (ref 65–99)
Potassium: 4.2 mmol/L (ref 3.5–5.3)
Sodium: 137 mmol/L (ref 135–146)
Total Bilirubin: 0.8 mg/dL (ref 0.2–1.2)
Total Protein: 6.3 g/dL (ref 6.1–8.1)

## 2019-06-01 LAB — HEMOGLOBIN A1C
Hgb A1c MFr Bld: 7 % of total Hgb — ABNORMAL HIGH (ref <5.7)
Mean Plasma Glucose: 154 (calc)
eAG (mmol/L): 8.5 (calc)

## 2019-06-02 LAB — PATHOLOGIST SMEAR REVIEW

## 2019-06-05 ENCOUNTER — Encounter: Payer: Self-pay | Admitting: "Endocrinology

## 2019-06-05 ENCOUNTER — Ambulatory Visit (INDEPENDENT_AMBULATORY_CARE_PROVIDER_SITE_OTHER): Payer: Medicare Other | Admitting: "Endocrinology

## 2019-06-05 ENCOUNTER — Other Ambulatory Visit: Payer: Self-pay

## 2019-06-05 DIAGNOSIS — E038 Other specified hypothyroidism: Secondary | ICD-10-CM | POA: Diagnosis not present

## 2019-06-05 DIAGNOSIS — E782 Mixed hyperlipidemia: Secondary | ICD-10-CM

## 2019-06-05 DIAGNOSIS — N183 Chronic kidney disease, stage 3 (moderate): Secondary | ICD-10-CM

## 2019-06-05 DIAGNOSIS — E1122 Type 2 diabetes mellitus with diabetic chronic kidney disease: Secondary | ICD-10-CM | POA: Diagnosis not present

## 2019-06-05 DIAGNOSIS — I10 Essential (primary) hypertension: Secondary | ICD-10-CM

## 2019-06-05 LAB — CULTURE, BODY FLUID W GRAM STAIN -BOTTLE: Culture: NO GROWTH

## 2019-06-05 MED ORDER — HUMULIN R U-500 KWIKPEN 500 UNIT/ML ~~LOC~~ SOPN: PEN_INJECTOR | SUBCUTANEOUS | 11 refills | Status: DC

## 2019-06-05 NOTE — Progress Notes (Signed)
06/05/2019                                                    Endocrinology Telehealth Visit Follow up Note -During COVID -19 Pandemic  This visit type was conducted due to national recommendations for restrictions regarding the COVID-19 Pandemic  in an effort to limit this patient's exposure and mitigate transmission of the corona virus.  Due to her co-morbid illnesses, Penny Martinez is at  moderate to high risk for complications without adequate follow up.  This format is felt to be most appropriate for her at this time.  I connected with this patient on 06/05/2019   by telephone and verified that I am speaking with the correct person using two identifiers. Penny Martinez, 10/04/1950. she has verbally consented to this visit. All issues noted in this document were discussed and addressed. The format was not optimal for physical exam.    Pt currently using Freestyle Libre to monitor BG levels.    Subjective:    Patient ID: Penny Martinez, female    DOB: Nov 14, 1950,    Past Medical History:  Diagnosis Date  . Acid reflux   . Anemia   . Arthritis   . C. difficile colitis June/July 2015  . Cancer ([place])    skin cancer  . CHF (congestive heart failure) ([place])   . 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 Negative viral markers 2015  . Complication of anesthesia   . COPD (chronic obstructive pulmonary disease) ([place])   . Diabetes mellitus without complication ([place])   . GERD (gastroesophageal reflux disease)   . History of kidney stones   . Hypercholesteremia   . Hypertension   . Hypothyroidism   . IBS (irritable bowel syndrome)   . Kidney disease   . Liver disease   . Neuropathy   . PONV (postoperative nausea and vomiting)   . Sleep apnea   . Thyroid disease    Past Surgical History:  Procedure Laterality Date  . ABDOMINAL HYSTERECTOMY    . APPENDECTOMY    . CAROTID ARTERY - SUBCLAVIAN ARTERY BYPASS GRAFT Right   . CARPAL TUNNEL RELEASE Right   . CARPAL TUNNEL RELEASE  Left 12/03/2016   Procedure: CARPAL TUNNEL RELEASE;  Surgeon: Carole Civil, MD;  Location: [facility];  Service: Orthopedics;  Laterality: Left;  . CATARACT EXTRACTION Bilateral   . CHOLECYSTECTOMY    . COLONOSCOPY N/A 02/19/2014   Dr. Gala Romney: rectal varices, rectal polyp overlying a varix non-manipulated  . ESOPHAGEAL BANDING N/A 08/15/2018   Procedure: ESOPHAGEAL BANDING;  Surgeon: Daneil Dolin, MD;  Location: [facility];  Service: Endoscopy;  Laterality: N/A;  . ESOPHAGEAL BANDING N/A 09/15/2018   Procedure: ESOPHAGEAL BANDING;  Surgeon: Daneil Dolin, MD;  Location: [facility];  Service: Endoscopy;  Laterality: N/A;  . ESOPHAGOGASTRODUODENOSCOPY N/A 02/19/2014   Dr. Gala Romney: 4 columns of grade 2 esophageal varices, multiple gastric polyps with largest biopsied and hyperplastic. Negative H.pylori  . ESOPHAGOGASTRODUODENOSCOPY N/A 04/13/2018   Dr. Gala Romney: Grade 3 esophageal varices, no esophagitis, portal gastropathy, multiple hyperplastic appearing polyps  . ESOPHAGOGASTRODUODENOSCOPY (EGD) WITH PROPOFOL N/A 08/15/2018   Procedure: ESOPHAGOGASTRODUODENOSCOPY (EGD) WITH PROPOFOL;  Surgeon: Daneil Dolin, MD;  Location: [facility];  Service: Endoscopy;  Laterality: N/A;  1:45pm  . ESOPHAGOGASTRODUODENOSCOPY (EGD)  WITH PROPOFOL N/A 09/15/2018   Dr. Gala Romney: Grade 2 esophageal varices, portal gastropathy, normal first and second duodenum, s/p banding X 5. Surveillance in 1 year  . GIVENS CAPSULE STUDY N/A 05/04/2014   multiple polypoid-like lesions throughout small bowel, scattered superficial non-bleeding erosions more distal bowel, referred to [facility] for enteroscopy. Capsule study not complete, poor images.   Marland Kitchen TRIGGER FINGER RELEASE Left 12/03/2016   Procedure: RELEASE TRIGGER FINGER/A-1 PULLEY LEFT LONG TRIGGER FINGER RELEASE;  Surgeon: Carole Civil, MD;  Location: [facility];  Service: Orthopedics;  Laterality: Left;  . urosepsis     Social History   Socioeconomic History  . Marital  status: Married    Spouse name: Not on file  . Number of children: Not on file  . Years of education: Not on file  . Highest education level: Not on file  Occupational History  . Not on file  Social Needs  . Financial resource strain: Not on file  . Food insecurity    Worry: Not on file    Inability: Not on file  . Transportation needs    Medical: Not on file    Non-medical: Not on file  Tobacco Use  . Smoking status: Former Smoker    Packs/day: 2.00    Years: 35.00    Pack years: 70.00    Quit date: 05/05/2003    Years since quitting: 16.0  . Smokeless tobacco: Never Used  . Tobacco comment: Quit smoking 11 years ago  Substance and Sexual Activity  . Alcohol use: No  . Drug use: No  . Sexual activity: Not Currently    Birth control/protection: Surgical  Lifestyle  . Physical activity    Days per week: Not on file    Minutes per session: Not on file  . Stress: Not on file  Relationships  . Social Herbalist on phone: Not on file    Gets together: Not on file    Attends religious service: Not on file    Active member of club or organization: Not on file    Attends meetings of clubs or organizations: Not on file    Relationship status: Not on file  Other Topics Concern  . Not on file  Social History Narrative  . Not on file   Outpatient Encounter Medications as of 06/05/2019  Medication Sig  . amitriptyline (ELAVIL) 25 MG tablet Take 25 mg by mouth at bedtime.  Marland Kitchen aspirin EC 81 MG tablet Take by mouth.  . Azelastine HCl 137 MCG/SPRAY SOLN Place 1 spray into both nostrils as needed (for congestion).   . benazepril (LOTENSIN) 20 MG tablet Take 20 mg by mouth daily.  . cephALEXin (KEFLEX) 500 MG capsule Take 500 mg by mouth 2 (two) times daily.  . Continuous Blood Gluc Sensor (FREESTYLE LIBRE 14 DAY SENSOR) MISC 1 each by Does not apply route every 14 (fourteen) days.  Marland Kitchen CRANBERRY PO Take 15,000 mg by mouth at bedtime.   . diphenhydrAMINE (SOMINEX) 25 MG  tablet Take 50 mg by mouth at bedtime.   . diphenoxylate-atropine (LOMOTIL) 2.5-0.025 MG tablet 1-2 tablets every 4-6 hours as needed for diarrhea. (Patient not taking: Reported on 05/18/2019)  . donepezil (ARICEPT) 5 MG tablet Take 5 mg by mouth at bedtime.   Marland Kitchen ezetimibe (ZETIA) 10 MG tablet Take 10 mg by mouth every morning.   . gabapentin (NEURONTIN) 300 MG capsule Take 300 mg by mouth at bedtime.  . insulin regular human CONCENTRATED (HUMULIN  R U-500 KWIKPEN) 500 UNIT/ML kwikpen Inject 80 units with breakfast and supper, 60 units with supper when premeal glucose is above 90  . lansoprazole (PREVACID) 30 MG capsule Take 1 capsule (30 mg total) by mouth 2 (two) times daily before a meal. (Patient taking differently: Take 30 mg by mouth daily. )  . levofloxacin (LEVAQUIN) 500 MG tablet TK 1 T PO QD FOR 10 DAYS  . levothyroxine (SYNTHROID) 100 MCG tablet TAKE 1 TABLET BY MOUTH ONCE DAILY.  . Multiple Vitamins-Minerals (CENTRUM SILVER ADULT 50+ PO) Take 1 tablet by mouth daily.  . ondansetron (ZOFRAN) 4 MG tablet Take 1 tablet (4 mg total) by mouth 4 (four) times daily -  before meals and at bedtime. (Patient not taking: Reported on 05/18/2019)  . torsemide (DEMADEX) 20 MG tablet Take 2 tablets by mouth daily.  Marland Kitchen VICTOZA 18 MG/3ML SOPN INJECT 1.8MG SUBCUTANEOUSLY ONCE DAILY.  Marland Kitchen Vitamin D, Ergocalciferol, (DRISDOL) 50000 units CAPS capsule Take 50,000 Units by mouth every 30 (thirty) days.   . [DISCONTINUED] HUMULIN R U-500 KWIKPEN 500 UNIT/ML kwikpen INJECT 80 UNITS INTO THE SKIN 3 TIMES DAILY WITH MEALS.   Facility-Administered Encounter Medications as of 06/05/2019  Medication  . albumin human 25 % solution 0-100 g   ALLERGIES: Allergies  Allergen Reactions  . Erythromycin Hives  . Niacin Rash and Shortness Of Breath  . Penicillins Shortness Of Breath and Other (See Comments)    Has patient had a PCN reaction causing immediate rash, facial/tongue/throat swelling, SOB or lightheadedness with  hypotension: Yes Has patient had a PCN reaction causing severe rash involving mucus membranes or skin necrosis: No Has patient had a PCN reaction that required hospitalization: No Has patient had a PCN reaction occurring within the last 10 years: No If all of the above answers are "NO", then may proceed with Cephalosporin use.   . Sulfa Antibiotics Hives  . Ciprofloxacin Itching and Nausea And Vomiting    Stomach pain  . Statins Other (See Comments)    Muscle Pain  . Codeine Itching  . Fluticasone Rash  . Tramadol Rash   VACCINATION STATUS: Immunization History  Administered Date(s) Administered  . Influenza, High Dose Seasonal PF 05/24/2018  . Influenza,inj,Quad PF,6+ Mos 07/24/2016, 06/24/2017  . Pneumococcal Polysaccharide-23 05/25/2018    Diabetes She presents for her follow-up diabetic visit. She has type 2 diabetes mellitus. Onset time: She was diagnosed at approximate age of 89 years. Her disease course has been improving. There are no hypoglycemic associated symptoms. Pertinent negatives for hypoglycemia include no confusion, headaches, pallor or seizures. Associated symptoms include fatigue. Pertinent negatives for diabetes include no chest pain, no polydipsia, no polyphagia and no polyuria. There are no hypoglycemic complications. Symptoms are improving. Diabetic complications include heart disease, nephropathy and PVD. Risk factors for coronary artery disease include diabetes mellitus, dyslipidemia, obesity, hypertension, sedentary lifestyle and tobacco exposure. Current diabetic treatment includes intensive insulin program. She is compliant with treatment most of the time. Her weight is decreasing steadily. She is following a generally unhealthy diet. She never participates in exercise. Her home blood glucose trend is fluctuating minimally. Her breakfast blood glucose range is generally 110-130 mg/dl. Her lunch blood glucose range is generally 130-140 mg/dl. Her dinner blood  glucose range is generally 130-140 mg/dl. Her bedtime blood glucose range is generally 140-180 mg/dl. Her overall blood glucose range is 140-180 mg/dl. An ACE inhibitor/angiotensin II receptor blocker is being taken. Eye exam is current.  Hypertension This is a chronic problem. The  current episode started more than 1 year ago. The problem is controlled. Pertinent negatives include no chest pain, headaches, palpitations or shortness of breath. Risk factors for coronary artery disease include diabetes mellitus, dyslipidemia, obesity, sedentary lifestyle and post-menopausal state. Hypertensive end-organ damage includes PVD.  Hyperlipidemia This is a chronic problem. The current episode started more than 1 year ago. Exacerbating diseases include diabetes and obesity. Pertinent negatives include no chest pain, myalgias or shortness of breath. Risk factors for coronary artery disease include diabetes mellitus, dyslipidemia, hypertension, a sedentary lifestyle and obesity.     Review of systems: Limited as above.  Objective:    There were no vitals taken for this visit.  Wt Readings from Last 3 Encounters:  05/18/19 225 lb 14.4 oz (102.5 kg)  04/28/19 225 lb 9.6 oz (102.3 kg)  02/22/19 231 lb 6.4 oz (105 kg)      Results for orders placed or performed during the hospital encounter of 05/31/19  Gram stain   Specimen: Ascitic; Body Fluid  Result Value Ref Range   Specimen Description ASCITIC    Special Requests NONE    Gram Stain      NO ORGANISMS SEEN WBC PRESENT, PREDOMINANTLY MONONUCLEAR CYTOSPIN SMEAR Performed at [facility], [address], [place], [place] [address]    Report Status 05/31/2019 FINAL   Culture, body fluid-bottle   Specimen: Ascitic  Result Value Ref Range   Specimen Description ASCITIC    Special Requests BOTTLES DRAWN AEROBIC AND ANAEROBIC 10 CC EACH    Culture      NO GROWTH 5 DAYS Performed at [facility], [address] [place], [place] [address]    Report  Status 06/05/2019 FINAL   Body fluid cell count with differential  Result Value Ref Range   Fluid Type-FCT ASCITIC    Color, Fluid YELLOW YELLOW   Appearance, Fluid HAZY (A) CLEAR   Total Nucleated Cell Count, Fluid 313 0 - 1,000 cu mm   Neutrophil Count, Fluid 6 0 - 25 %   Lymphs, Fluid 82 %   Monocyte-Macrophage-Serous Fluid 12 (L) 50 - 90 %   Eos, Fluid 0 %   Other Cells, Fluid MESOTHELIAL CELLS %  Pathologist smear review  Result Value Ref Range   Path Review Reviewed by Kalman Drape, M.D.    Complete Blood Count (Most recent): Lab Results  Component Value Date   WBC 5.5 05/01/2019   HGB 9.5 (L) 05/01/2019   HCT 32.6 (L) 05/01/2019   MCV 82.5 05/01/2019   PLT 133 (L) 05/01/2019    Diabetic Labs (most recent): Lab Results  Component Value Date   HGBA1C 7.0 (H) 05/31/2019   HGBA1C 6.7 (H) 01/12/2019   HGBA1C 7.1 (H) 08/02/2018      Assessment & Plan:   1. Uncontrolled type 2 diabetes mellitus with complication, with long-term current use of insulin ([place])  Her diabetes is  complicated by chronic kidney disease and peripheral arterial disease as well as CHF. -She continued to respond to insulin U500. -She reports improved glycemic profile with random nocturnal hypoglycemia.  Her A1c 7%.   - She remains at a high risk for more acute and chronic complications of diabetes which include CAD, CVA, CKD, retinopathy, and neuropathy. These are all discussed in detail with the patient.    Glucose logs and insulin administration records pertaining to this visit,  to be scanned into patient's records.  Recent labs reviewed.   - I have re-counseled the patient on diet management  and weight loss  by adopting a carbohydrate restricted / protein rich  Diet. - she  admits there is a room for improvement in her diet and drink choices. -  Suggestion is made for her to avoid simple carbohydrates  from her diet including Cakes, Sweet Desserts / Pastries, Ice Cream, Soda (diet and  regular), Sweet Tea, Candies, Chips, Cookies, Sweet Pastries,  Store Bought Juices, Alcohol in Excess of  1-2 drinks a day, Artificial Sweeteners, Coffee Creamer, and "Sugar-free" Products. This will help patient to have stable blood glucose profile and potentially avoid unintended weight gain.  - Patient is advised to stick to a routine mealtimes to eat 3 meals  a day and avoid unnecessary snacks ( to snack only to correct hypoglycemia).  - I have approached patient with the following individualized plan to manage diabetes and patient agrees.  -She is  at risk of both hypoglycemia and hyperglycemia due to possible cognitive decline.   -Due to her random hypoglycemia, she is advised to lower her insulin U500 to 80 units with breakfast, 80 units with lunch, and 60 units with supper for pre-meal blood glucose readings greater than than 90 mg/dL, associated with strict monitoring of blood glucose 4 times a day-before meals and at bedtime.   She is advised to wear her CGM device at all times. -She advised to continue Victoza 1.8 mg.  -Patient is encouraged to call clinic for blood glucose levels less than 70 or above 300 mg /dl. -She declined referral to the dietitian.  -Patient is not a candidate for metformin andSGLT2 inhibitors due to CKD.  - Patient specific target  for A1c; LDL, HDL, Triglycerides, and  Waist Circumference were discussed in detail.  2) BP/HTN: she is advised to home monitor blood pressure and report if > 140/90 on 2 separate readings.  She is advised to continue her current blood pressure medications including benazepril 20 mg p.o. daily, Lasix as needed, spironolactone 25 mg p.o. Daily.  3) Lipids/HPL: She does not tolerate statins.  She is on Zetia 10 mg p.o. Daily.  She will be considered for fasting lipid panel on subsequent visits.     4)  Weight/Diet:  exercise, and carbohydrates information provided.  4) Primary hypothyroidism  -Her previsit thyroid function  tests are consistent with appropriate replacement.  She is advised to continue levothyroxine 100 mcg p.o. every morning.   - We discussed about the correct intake of her thyroid hormone, on empty stomach at fasting, with water, separated by at least 30 minutes from breakfast and other medications,  and separated by more than 4 hours from calcium, iron, multivitamins, acid reflux medications (PPIs). -Patient is made aware of the fact that thyroid hormone replacement is needed for life, dose to be adjusted by periodic monitoring of thyroid function tests.   6) Chronic Care/Health Maintenance:  -Patient is  on ACEI/ARB and Statin medications and encouraged to continue to follow up with Ophthalmology, Podiatrist at least yearly or according to recommendations, and advised to  stay away from smoking. I have recommended yearly flu vaccine and pneumonia vaccination at least every 5 years; and  sleep for at least 7 hours a day. - She cannot exercise at optimum level. I advised patient to maintain close follow up with her PCP for primary care needs.  - Patient Care Time Today:  25 min, of which >50% was spent in  counseling and the rest reviewing her  current and  previous labs/studies, previous treatments, her blood  glucose readings, and medications' doses and developing a plan for long-term care based on the latest recommendations for standards of care.   Penny Martinez participated in the discussions, expressed understanding, and voiced agreement with the above plans.  All questions were answered to her satisfaction. she is encouraged to contact clinic should she have any questions or concerns prior to her return visit.   Follow up plan: Return in about 4 months (around 10/05/2019) for Bring Meter and Logs- A1c in Office, Include 8 log sheets.  Glade Lloyd, MD Phone: [phone]  Fax: [phone]  This note was partially dictated with voice recognition software. Similar sounding words can be  transcribed inadequately or may not  be corrected upon review.  06/05/2019, 5:50 PM

## 2019-06-06 ENCOUNTER — Ambulatory Visit: Payer: Medicare Other | Admitting: "Endocrinology

## 2019-06-08 ENCOUNTER — Other Ambulatory Visit (HOSPITAL_COMMUNITY): Payer: Self-pay | Admitting: Gastroenterology

## 2019-06-08 DIAGNOSIS — R188 Other ascites: Secondary | ICD-10-CM

## 2019-06-13 ENCOUNTER — Other Ambulatory Visit: Payer: Self-pay

## 2019-06-13 ENCOUNTER — Telehealth: Payer: Self-pay | Admitting: *Deleted

## 2019-06-13 ENCOUNTER — Inpatient Hospital Stay (HOSPITAL_COMMUNITY)
Admission: EM | Admit: 2019-06-13 | Discharge: 2019-06-15 | DRG: 194 | Disposition: A | Discharge status: Home / Self Care | Payer: Medicare Other | Attending: Family Medicine | Admitting: Family Medicine

## 2019-06-13 ENCOUNTER — Emergency Department (HOSPITAL_COMMUNITY): Payer: Medicare Other

## 2019-06-13 ENCOUNTER — Encounter (HOSPITAL_COMMUNITY): Payer: Self-pay

## 2019-06-13 DIAGNOSIS — Z888 Allergy status to other drugs, medicaments and biological substances status: Secondary | ICD-10-CM

## 2019-06-13 DIAGNOSIS — Z79899 Other long term (current) drug therapy: Secondary | ICD-10-CM

## 2019-06-13 DIAGNOSIS — R9431 Abnormal electrocardiogram [ECG] [EKG]: Secondary | ICD-10-CM | POA: Diagnosis present

## 2019-06-13 DIAGNOSIS — I4510 Unspecified right bundle-branch block: Secondary | ICD-10-CM | POA: Diagnosis present

## 2019-06-13 DIAGNOSIS — E872 Acidosis: Secondary | ICD-10-CM | POA: Diagnosis present

## 2019-06-13 DIAGNOSIS — Z85828 Personal history of other malignant neoplasm of skin: Secondary | ICD-10-CM

## 2019-06-13 DIAGNOSIS — N179 Acute kidney failure, unspecified: Secondary | ICD-10-CM | POA: Diagnosis present

## 2019-06-13 DIAGNOSIS — E7800 Pure hypercholesterolemia, unspecified: Secondary | ICD-10-CM | POA: Diagnosis present

## 2019-06-13 DIAGNOSIS — J189 Pneumonia, unspecified organism: Secondary | ICD-10-CM | POA: Diagnosis present

## 2019-06-13 DIAGNOSIS — Z20828 Contact with and (suspected) exposure to other viral communicable diseases: Secondary | ICD-10-CM | POA: Diagnosis present

## 2019-06-13 DIAGNOSIS — E860 Dehydration: Secondary | ICD-10-CM | POA: Diagnosis present

## 2019-06-13 DIAGNOSIS — Z882 Allergy status to sulfonamides status: Secondary | ICD-10-CM

## 2019-06-13 DIAGNOSIS — Z794 Long term (current) use of insulin: Secondary | ICD-10-CM

## 2019-06-13 DIAGNOSIS — E782 Mixed hyperlipidemia: Secondary | ICD-10-CM | POA: Diagnosis present

## 2019-06-13 DIAGNOSIS — K7460 Unspecified cirrhosis of liver: Secondary | ICD-10-CM | POA: Diagnosis present

## 2019-06-13 DIAGNOSIS — I5032 Chronic diastolic (congestive) heart failure: Secondary | ICD-10-CM | POA: Diagnosis present

## 2019-06-13 DIAGNOSIS — Z833 Family history of diabetes mellitus: Secondary | ICD-10-CM

## 2019-06-13 DIAGNOSIS — K219 Gastro-esophageal reflux disease without esophagitis: Secondary | ICD-10-CM | POA: Diagnosis present

## 2019-06-13 DIAGNOSIS — Z23 Encounter for immunization: Secondary | ICD-10-CM | POA: Diagnosis present

## 2019-06-13 DIAGNOSIS — I110 Hypertensive heart disease with heart failure: Secondary | ICD-10-CM | POA: Diagnosis present

## 2019-06-13 DIAGNOSIS — R0902 Hypoxemia: Secondary | ICD-10-CM | POA: Diagnosis present

## 2019-06-13 DIAGNOSIS — Z87442 Personal history of urinary calculi: Secondary | ICD-10-CM

## 2019-06-13 DIAGNOSIS — E1140 Type 2 diabetes mellitus with diabetic neuropathy, unspecified: Secondary | ICD-10-CM | POA: Diagnosis present

## 2019-06-13 DIAGNOSIS — Z8249 Family history of ischemic heart disease and other diseases of the circulatory system: Secondary | ICD-10-CM

## 2019-06-13 DIAGNOSIS — R188 Other ascites: Secondary | ICD-10-CM | POA: Diagnosis present

## 2019-06-13 DIAGNOSIS — Z880 Allergy status to penicillin: Secondary | ICD-10-CM | POA: Diagnosis not present

## 2019-06-13 DIAGNOSIS — D509 Iron deficiency anemia, unspecified: Secondary | ICD-10-CM | POA: Diagnosis present

## 2019-06-13 DIAGNOSIS — Z885 Allergy status to narcotic agent status: Secondary | ICD-10-CM | POA: Diagnosis not present

## 2019-06-13 DIAGNOSIS — I959 Hypotension, unspecified: Secondary | ICD-10-CM | POA: Diagnosis present

## 2019-06-13 DIAGNOSIS — J440 Chronic obstructive pulmonary disease with acute lower respiratory infection: Secondary | ICD-10-CM | POA: Diagnosis present

## 2019-06-13 DIAGNOSIS — Z79890 Hormone replacement therapy (postmenopausal): Secondary | ICD-10-CM

## 2019-06-13 DIAGNOSIS — Z881 Allergy status to other antibiotic agents status: Secondary | ICD-10-CM | POA: Diagnosis not present

## 2019-06-13 DIAGNOSIS — Z90710 Acquired absence of both cervix and uterus: Secondary | ICD-10-CM

## 2019-06-13 DIAGNOSIS — Z841 Family history of disorders of kidney and ureter: Secondary | ICD-10-CM

## 2019-06-13 DIAGNOSIS — Z7982 Long term (current) use of aspirin: Secondary | ICD-10-CM

## 2019-06-13 DIAGNOSIS — Z87891 Personal history of nicotine dependence: Secondary | ICD-10-CM

## 2019-06-13 DIAGNOSIS — Z792 Long term (current) use of antibiotics: Secondary | ICD-10-CM

## 2019-06-13 LAB — CBC WITH DIFFERENTIAL/PLATELET
Abs Immature Granulocytes: 0.08 10*3/uL — ABNORMAL HIGH (ref 0.00–0.07)
Basophils Absolute: 0 10*3/uL (ref 0.0–0.1)
Basophils Relative: 0 %
Eosinophils Absolute: 0.1 10*3/uL (ref 0.0–0.5)
Eosinophils Relative: 1 %
HCT: 41 % (ref 36.0–46.0)
Hemoglobin: 12.1 g/dL (ref 12.0–15.0)
Immature Granulocytes: 1 %
Lymphocytes Relative: 17 %
Lymphs Abs: 1.6 10*3/uL (ref 0.7–4.0)
MCH: 25.6 pg — ABNORMAL LOW (ref 26.0–34.0)
MCHC: 29.5 g/dL — ABNORMAL LOW (ref 30.0–36.0)
MCV: 86.9 fL (ref 80.0–100.0)
Monocytes Absolute: 0.9 10*3/uL (ref 0.1–1.0)
Monocytes Relative: 9 %
Neutro Abs: 6.8 10*3/uL (ref 1.7–7.7)
Neutrophils Relative %: 72 %
Platelets: 203 10*3/uL (ref 150–400)
RBC: 4.72 MIL/uL (ref 3.87–5.11)
RDW: 23.2 % — ABNORMAL HIGH (ref 11.5–15.5)
WBC: 9.6 10*3/uL (ref 4.0–10.5)
nRBC: 0 % (ref 0.0–0.2)

## 2019-06-13 LAB — BODY FLUID CELL COUNT WITH DIFFERENTIAL
Eos, Fluid: 0 %
Lymphs, Fluid: 91 %
Monocyte-Macrophage-Serous Fluid: 2 % — ABNORMAL LOW (ref 50–90)
Neutrophil Count, Fluid: 7 % (ref 0–25)
Total Nucleated Cell Count, Fluid: 226 cu mm (ref 0–1000)

## 2019-06-13 LAB — COMPREHENSIVE METABOLIC PANEL
ALT: 19 U/L (ref 0–44)
AST: 42 U/L — ABNORMAL HIGH (ref 15–41)
Albumin: 2.7 g/dL — ABNORMAL LOW (ref 3.5–5.0)
Alkaline Phosphatase: 110 U/L (ref 38–126)
Anion gap: 10 (ref 5–15)
BUN: 23 mg/dL (ref 8–23)
CO2: 24 mmol/L (ref 22–32)
Calcium: 8.3 mg/dL — ABNORMAL LOW (ref 8.9–10.3)
Chloride: 98 mmol/L (ref 98–111)
Creatinine, Ser: 1.65 mg/dL — ABNORMAL HIGH (ref 0.44–1.00)
GFR calc Af Amer: 37 mL/min — ABNORMAL LOW (ref >60)
GFR calc non Af Amer: 32 mL/min — ABNORMAL LOW (ref >60)
Glucose, Bld: 157 mg/dL — ABNORMAL HIGH (ref 70–99)
Potassium: 4.3 mmol/L (ref 3.5–5.1)
Sodium: 132 mmol/L — ABNORMAL LOW (ref 135–145)
Total Bilirubin: 0.6 mg/dL (ref 0.3–1.2)
Total Protein: 6.6 g/dL (ref 6.5–8.1)

## 2019-06-13 LAB — CBG MONITORING, ED: Glucose-Capillary: 61 mg/dL — ABNORMAL LOW (ref 70–99)

## 2019-06-13 LAB — LACTATE DEHYDROGENASE: LDH: 262 U/L — ABNORMAL HIGH (ref 98–192)

## 2019-06-13 LAB — GLUCOSE, PLEURAL OR PERITONEAL FLUID: Glucose, Fluid: 265 mg/dL

## 2019-06-13 LAB — BRAIN NATRIURETIC PEPTIDE: B Natriuretic Peptide: 34 pg/mL (ref 0.0–100.0)

## 2019-06-13 LAB — LACTATE DEHYDROGENASE, PLEURAL OR PERITONEAL FLUID: LD, Fluid: 57 U/L — ABNORMAL HIGH (ref 3–23)

## 2019-06-13 LAB — PROTEIN, PLEURAL OR PERITONEAL FLUID: Total protein, fluid: <3 g/dL

## 2019-06-13 LAB — PROTIME-INR
INR: 1.3 — ABNORMAL HIGH (ref 0.8–1.2)
Prothrombin Time: 15.7 seconds — ABNORMAL HIGH (ref 11.4–15.2)

## 2019-06-13 LAB — ALBUMIN, PLEURAL OR PERITONEAL FLUID: Albumin, Fluid: 0.7 g/dL

## 2019-06-13 LAB — MAGNESIUM: Magnesium: 2.3 mg/dL (ref 1.7–2.4)

## 2019-06-13 LAB — GRAM STAIN

## 2019-06-13 LAB — SARS CORONAVIRUS 2 BY RT PCR (HOSPITAL ORDER, PERFORMED IN ~~LOC~~ HOSPITAL LAB): SARS Coronavirus 2: NEGATIVE

## 2019-06-13 LAB — LACTIC ACID, PLASMA: Lactic Acid, Venous: 4.3 mmol/L (ref 0.5–1.9)

## 2019-06-13 MED ORDER — SODIUM CHLORIDE 0.9 % IV SOLN
2.0000 g | Freq: Once | INTRAVENOUS | Status: AC
  Administered 2019-06-13: 21:00:00 2 g via INTRAVENOUS
  Filled 2019-06-13: qty 2

## 2019-06-13 MED ORDER — SODIUM CHLORIDE 0.9 % IV SOLN
INTRAVENOUS | Status: DC
  Administered 2019-06-13: 19:00:00 via INTRAVENOUS

## 2019-06-13 MED ORDER — INSULIN ASPART 100 UNIT/ML ~~LOC~~ SOLN
0.0000 [IU] | Freq: Three times a day (TID) | SUBCUTANEOUS | Status: DC
  Administered 2019-06-14 – 2019-06-15 (×3): 8 [IU] via SUBCUTANEOUS
  Administered 2019-06-15: 09:00:00 11 [IU] via SUBCUTANEOUS

## 2019-06-13 MED ORDER — INSULIN ASPART 100 UNIT/ML ~~LOC~~ SOLN
0.0000 [IU] | Freq: Every day | SUBCUTANEOUS | Status: DC
  Administered 2019-06-14: 4 [IU] via SUBCUTANEOUS

## 2019-06-13 MED ORDER — SODIUM CHLORIDE 0.9 % IV SOLN
2.0000 g | Freq: Every day | INTRAVENOUS | Status: DC
  Administered 2019-06-13 – 2019-06-14 (×2): 2 g via INTRAVENOUS
  Filled 2019-06-13 (×2): qty 20

## 2019-06-13 MED ORDER — SODIUM CHLORIDE 0.9 % IV SOLN
100.0000 mg | Freq: Two times a day (BID) | INTRAVENOUS | Status: DC
  Administered 2019-06-13 – 2019-06-15 (×4): 100 mg via INTRAVENOUS
  Filled 2019-06-13 (×8): qty 100

## 2019-06-13 MED ORDER — POLYETHYLENE GLYCOL 3350 17 G PO PACK: 17.0000 g | PACK | Freq: Every day | ORAL | Status: DC | PRN

## 2019-06-13 MED ORDER — DEXTROSE 50 % IV SOLN
25.0000 mL | Freq: Once | INTRAVENOUS | Status: AC
  Administered 2019-06-13: 25 mL via INTRAVENOUS
  Filled 2019-06-13: qty 50

## 2019-06-13 MED ORDER — ACETAMINOPHEN 650 MG RE SUPP: 650.0000 mg | Freq: Four times a day (QID) | RECTAL | Status: DC | PRN

## 2019-06-13 MED ORDER — ONDANSETRON HCL 4 MG PO TABS: 4.0000 mg | ORAL_TABLET | Freq: Four times a day (QID) | ORAL | Status: DC | PRN

## 2019-06-13 MED ORDER — ALBUMIN HUMAN 25 % IV SOLN
50.0000 g | Freq: Once | INTRAVENOUS | Status: AC
  Administered 2019-06-13: 23:00:00 50 g via INTRAVENOUS
  Filled 2019-06-13: qty 200

## 2019-06-13 MED ORDER — SODIUM CHLORIDE 0.9 % IV SOLN
INTRAVENOUS | Status: AC
  Administered 2019-06-13: 23:00:00 75 mL/h via INTRAVENOUS
  Administered 2019-06-14: 13:00:00 via INTRAVENOUS

## 2019-06-13 MED ORDER — SODIUM CHLORIDE 0.9 % IV SOLN: 2.0000 g | INTRAVENOUS | Status: DC

## 2019-06-13 MED ORDER — SODIUM CHLORIDE 0.9 % IV SOLN: 2.0000 g | Freq: Three times a day (TID) | INTRAVENOUS | Status: DC

## 2019-06-13 MED ORDER — ACETAMINOPHEN 325 MG PO TABS
650.0000 mg | ORAL_TABLET | Freq: Four times a day (QID) | ORAL | Status: DC | PRN
  Administered 2019-06-14: 15:00:00 650 mg via ORAL
  Filled 2019-06-13: qty 2

## 2019-06-13 MED ORDER — SODIUM CHLORIDE 0.9 % IV BOLUS
1000.0000 mL | Freq: Once | INTRAVENOUS | Status: AC
  Administered 2019-06-13: 23:00:00 1000 mL via INTRAVENOUS

## 2019-06-13 MED ORDER — SODIUM CHLORIDE 0.9 % IV BOLUS
1000.0000 mL | Freq: Once | INTRAVENOUS | Status: AC
  Administered 2019-06-13: 21:00:00 1000 mL via INTRAVENOUS

## 2019-06-13 MED ORDER — ALBUMIN HUMAN 25 % IV SOLN
INTRAVENOUS | Status: AC
  Filled 2019-06-13: qty 200

## 2019-06-13 MED ORDER — ONDANSETRON HCL 4 MG/2ML IJ SOLN: 4.0000 mg | Freq: Four times a day (QID) | INTRAMUSCULAR | Status: DC | PRN

## 2019-06-13 MED ORDER — METRONIDAZOLE IN NACL 5-0.79 MG/ML-% IV SOLN
500.0000 mg | Freq: Once | INTRAVENOUS | Status: AC
  Administered 2019-06-13: 22:00:00 500 mg via INTRAVENOUS
  Filled 2019-06-13: qty 100

## 2019-06-13 NOTE — ED Notes (Signed)
Hospitalist at bedside 

## 2019-06-13 NOTE — ED Provider Notes (Signed)
[facility] EMERGENCY DEPARTMENT Provider Note   CSN: [id number] Arrival date & time: 06/13/19  1734     History   Chief Complaint Chief Complaint  Patient presents with  . Abdominal Pain    HPI Penny Martinez is a 68 y.o. female.     HPI  This patient is a 68 year old female, she has a known history of cirrhosis, she is a diabetic, she has iron deficiency anemia.  She was scheduled for a ultrasound-guided peritoneal paracentesis tomorrow however because of increasing abdominal pain and distention she has decided to come to the hospital today.  Symptoms are persistent, gradually worsening, they became much worse today, they are not associated with fevers nausea vomiting or diarrhea.  She does endorse diffuse swelling of her legs as well.  This is not unusual but seem to come on sooner than later.,  Sooner than usual.  Past Medical History:  Diagnosis Date  . Acid reflux   . Anemia   . Arthritis   . C. difficile colitis June/July 2015  . Cancer ([place])    skin cancer  . CHF (congestive heart failure) ([place])   . 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 Negative viral markers 2015  . Complication of anesthesia   . COPD (chronic obstructive pulmonary disease) ([place])   . Diabetes mellitus without complication ([place])   . GERD (gastroesophageal reflux disease)   . History of kidney stones   . Hypercholesteremia   . Hypertension   . Hypothyroidism   . IBS (irritable bowel syndrome)   . Kidney disease   . Liver disease   . Neuropathy   . PONV (postoperative nausea and vomiting)   . Sleep apnea   . Thyroid disease     Patient Active Problem List   Diagnosis Date Noted  . GERD (gastroesophageal reflux disease) 10/25/2018  . LUQ fullness 07/25/2018  . CKD stage 3 due to type 2 diabetes mellitus ([place]) 05/24/2018  . Hyponatremia syndrome 05/24/2018  . Other dysphagia 09/15/2017  . Uncontrolled type 2 diabetes mellitus with complication ([place]) 70/62/3762  . Mixed  hyperlipidemia 04/27/2017  . Hyponatremia 04/27/2017  . Chronic diarrhea 12/15/2016  . Carpal tunnel syndrome, left   . Trigger middle finger of left hand   . Thrombocytopenia ([place]) 11/04/2016  . Iron deficiency anemia due to chronic blood loss 01/31/2016  . Other specified hypothyroidism 09/20/2015  . Essential hypertension, benign 07/12/2015  . Morbid obesity with BMI of 40.0-44.9, adult ([place]) 07/12/2015  . Hepatic cirrhosis ([place]) 04/02/2014  . Acute colitis 03/05/2014  . Abdominal pain, acute, left lower quadrant 03/05/2014  . Colitis 03/05/2014  . Splenomegaly, congestive, chronic 02/17/2014  . Sepsis ([place]) 02/15/2014  . Absolute anemia 02/15/2014  . Diabetes mellitus with stage 3 chronic kidney disease ([place]) 02/15/2014  . CHF (congestive heart failure) ([place]) 02/15/2014  . CKD (chronic kidney disease) 02/15/2014  . UTI (lower urinary tract infection) 02/15/2014    Past Surgical History:  Procedure Laterality Date  . ABDOMINAL HYSTERECTOMY    . APPENDECTOMY    . CAROTID ARTERY - SUBCLAVIAN ARTERY BYPASS GRAFT Right   . CARPAL TUNNEL RELEASE Right   . CARPAL TUNNEL RELEASE Left 12/03/2016   Procedure: CARPAL TUNNEL RELEASE;  Surgeon: Carole Civil, MD;  Location: [facility];  Service: Orthopedics;  Laterality: Left;  . CATARACT EXTRACTION Bilateral   . CHOLECYSTECTOMY    . COLONOSCOPY N/A 02/19/2014   Dr. Gala Romney: rectal varices, rectal polyp overlying a varix non-manipulated  .  ESOPHAGEAL BANDING N/A 08/15/2018   Procedure: ESOPHAGEAL BANDING;  Surgeon: Daneil Dolin, MD;  Location: [facility] END[facility] SU[facility];  Service: Endoscopy;  Laterality: N/A;  . ESOPHAGEAL BANDING N/A 09/15/2018   Procedure: ESOPHAGEAL BANDING;  Surgeon: Daneil Dolin, MD;  Location: [facility];  Service: Endoscopy;  Laterality: N/A;  . ESOPHAGOGASTRODUODENOSCOPY N/A 02/19/2014   Dr. Gala Romney: 4 columns of grade 2 esophageal varices, multiple gastric polyps with largest biopsied and hyperplastic. Negative H.pylori   . ESOPHAGOGASTRODUODENOSCOPY N/A 04/13/2018   Dr. Gala Romney: Grade 3 esophageal varices, no esophagitis, portal gastropathy, multiple hyperplastic appearing polyps  . ESOPHAGOGASTRODUODENOSCOPY (EGD) WITH PROPOFOL N/A 08/15/2018   Procedure: ESOPHAGOGASTRODUODENOSCOPY (EGD) WITH PROPOFOL;  Surgeon: Daneil Dolin, MD;  Location: [facility];  Service: Endoscopy;  Laterality: N/A;  1:45pm  . ESOPHAGOGASTRODUODENOSCOPY (EGD) WITH PROPOFOL N/A 09/15/2018   Dr. Gala Romney: Grade 2 esophageal varices, portal gastropathy, normal first and second duodenum, s/p banding X 5. Surveillance in 1 year  . GIVENS CAPSULE STUDY N/A 05/04/2014   multiple polypoid-like lesions throughout small bowel, scattered superficial non-bleeding erosions more distal bowel, referred to [facility] for enteroscopy. Capsule study not complete, poor images.   Marland Kitchen TRIGGER FINGER RELEASE Left 12/03/2016   Procedure: RELEASE TRIGGER FINGER/A-1 PULLEY LEFT LONG TRIGGER FINGER RELEASE;  Surgeon: Carole Civil, MD;  Location: [facility];  Service: Orthopedics;  Laterality: Left;  . urosepsis       OB History    Gravida      Para      Term      Preterm      AB      Living  3     SAB      TAB      Ectopic      Multiple      Live Births               Home Medications    Prior to Admission medications   Medication Sig Start Date End Date Taking? Authorizing Provider  amitriptyline (ELAVIL) 25 MG tablet Take 25 mg by mouth at bedtime.    [provider]  aspirin EC 81 MG tablet Take by mouth.    [provider]  Azelastine HCl 137 MCG/SPRAY SOLN Place 1 spray into both nostrils as needed (for congestion).  03/21/18   [provider]  benazepril (LOTENSIN) 20 MG tablet Take 20 mg by mouth daily. 07/08/18   [provider]  cephALEXin (KEFLEX) 500 MG capsule Take 500 mg by mouth 2 (two) times daily.    [provider]  Continuous Blood Gluc Sensor (FREESTYLE LIBRE 14 DAY SENSOR) MISC  1 each by Does not apply route every 14 (fourteen) days. 01/11/19   Cassandria Anger, MD  CRANBERRY PO Take 15,000 mg by mouth at bedtime.     [provider]  diphenhydrAMINE (SOMINEX) 25 MG tablet Take 50 mg by mouth at bedtime.     [provider]  diphenoxylate-atropine (LOMOTIL) 2.5-0.025 MG tablet 1-2 tablets every 4-6 hours as needed for diarrhea. Patient not taking: Reported on 05/18/2019 04/28/19   Annitta Needs, NP  donepezil (ARICEPT) 5 MG tablet Take 5 mg by mouth at bedtime.  01/12/18   [provider]  ezetimibe (ZETIA) 10 MG tablet Take 10 mg by mouth every morning.     [provider]  gabapentin (NEURONTIN) 300 MG capsule Take 300 mg by mouth at bedtime.    [provider]  insulin regular  human CONCENTRATED (HUMULIN R U-500 KWIKPEN) 500 UNIT/ML kwikpen Inject 80 units with breakfast and supper, 60 units with supper when premeal glucose is above 90 06/05/19   Nida, Marella Chimes, MD  lansoprazole (PREVACID) 30 MG capsule Take 1 capsule (30 mg total) by mouth 2 (two) times daily before a meal. Patient taking differently: Take 30 mg by mouth daily.  07/13/18   Mahala Menghini, PA-C  levofloxacin (LEVAQUIN) 500 MG tablet TK 1 T PO QD FOR 10 DAYS 02/12/19   [provider]  levothyroxine (SYNTHROID) 100 MCG tablet TAKE 1 TABLET BY MOUTH ONCE DAILY. 06/01/19   Cassandria Anger, MD  Multiple Vitamins-Minerals (CENTRUM SILVER ADULT 50+ PO) Take 1 tablet by mouth daily.    [provider]  ondansetron (ZOFRAN) 4 MG tablet Take 1 tablet (4 mg total) by mouth 4 (four) times daily -  before meals and at bedtime. Patient not taking: Reported on 05/18/2019 02/22/19   Annitta Needs, NP  torsemide (DEMADEX) 20 MG tablet Take 2 tablets by mouth daily. 04/20/19 04/19/20  [provider]  [place] 18 MG/3ML SOPN INJECT 1.8MG SUBCUTANEOUSLY ONCE DAILY. 04/10/19   Cassandria Anger, MD  Vitamin D, Ergocalciferol, (DRISDOL) 50000  units CAPS capsule Take 50,000 Units by mouth every 30 (thirty) days.  01/20/18   [provider]    Family History Family History  Problem Relation Age of Onset  . Diabetes Mother   . Hypertension Mother   . Kidney failure Mother   . Blindness Mother   . Aneurysm Father   . Colon cancer Neg Hx     Social History Social History   Tobacco Use  . Smoking status: Former Smoker    Packs/day: 2.00    Years: 35.00    Pack years: 70.00    Quit date: 05/05/2003    Years since quitting: 16.1  . Smokeless tobacco: Never Used  . Tobacco comment: Quit smoking 11 years ago  Substance Use Topics  . Alcohol use: No  . Drug use: No     Allergies   Erythromycin, Niacin, Penicillins, Sulfa antibiotics, Ciprofloxacin, Statins, Codeine, Fluticasone, and Tramadol   Review of Systems Review of Systems  All other systems reviewed and are negative.    Physical Exam Updated Vital Signs BP (!) 82/61 (BP Location: Right Wrist)   Pulse 96   Temp 98.5 F (36.9 C) (Oral)   Resp 18   Ht 1.549 m (5' 1" )   Wt 106.1 kg   SpO2 98%   BMI 44.21 kg/m   Physical Exam Vitals signs and nursing note reviewed.  Constitutional:      General: She is not in acute distress.    Appearance: She is well-developed.  HENT:     Head: Normocephalic and atraumatic.     Mouth/Throat:     Pharynx: No oropharyngeal exudate.  Eyes:     General: No scleral icterus.       Right eye: No discharge.        Left eye: No discharge.     Conjunctiva/sclera: Conjunctivae normal.     Pupils: Pupils are equal, round, and reactive to light.  Neck:     Musculoskeletal: Normal range of motion and neck supple.     Thyroid: No thyromegaly.     Vascular: No JVD.  Cardiovascular:     Rate and Rhythm: Normal rate and regular rhythm.     Heart sounds: Normal heart sounds. No murmur. No friction rub. No gallop.  Pulmonary:     Effort: Pulmonary effort is normal. No respiratory distress.     Breath sounds:  Normal breath sounds. No wheezing or rales.  Abdominal:     General: Bowel sounds are normal. There is distension.     Palpations: There is no mass.     Tenderness: There is abdominal tenderness.     Comments: Mild guarding, diffuse tenderness, very full, dull, fluid wave, no tympanitic sounds to percussion  Musculoskeletal: Normal range of motion.        General: No tenderness.     Right lower leg: Edema present.     Left lower leg: Edema present.  Lymphadenopathy:     Cervical: No cervical adenopathy.  Skin:    General: Skin is warm and dry.     Findings: No erythema or rash.  Neurological:     Mental Status: She is alert.     Coordination: Coordination normal.  Psychiatric:        Behavior: Behavior normal.      ED Treatments / Results  Labs (all labs ordered are listed, but only abnormal results are displayed) Labs Reviewed  CBC WITH DIFFERENTIAL/PLATELET - Abnormal; Notable for the following components:      Result Value   MCH 25.6 (*)    MCHC 29.5 (*)    RDW 23.2 (*)    Abs Immature Granulocytes 0.08 (*)    All other components within normal limits  COMPREHENSIVE METABOLIC PANEL - Abnormal; Notable for the following components:   Sodium 132 (*)    Glucose, Bld 157 (*)    Creatinine, Ser 1.65 (*)    Calcium 8.3 (*)    Albumin 2.7 (*)    AST 42 (*)    GFR calc non Af Amer 32 (*)    GFR calc Af Amer 37 (*)    All other components within normal limits  URINALYSIS, ROUTINE W REFLEX MICROSCOPIC - Abnormal; Notable for the following components:   APPearance HAZY (*)    Leukocytes,Ua TRACE (*)    Bacteria, UA RARE (*)    All other components within normal limits  LACTATE DEHYDROGENASE, PLEURAL OR PERITONEAL FLUID - Abnormal; Notable for the following components:   LD, Fluid 57 (*)    All other components within normal limits  LACTIC ACID, PLASMA - Abnormal; Notable for the following components:   Lactic Acid, Venous 4.3 (*)    All other components within normal  limits  PROTIME-INR - Abnormal; Notable for the following components:   Prothrombin Time 15.7 (*)    INR 1.3 (*)    All other components within normal limits  BODY FLUID CELL COUNT WITH DIFFERENTIAL - Abnormal; Notable for the following components:   Color, Fluid STRAW (*)    Appearance, Fluid CLOUDY (*)    Monocyte-Macrophage-Serous Fluid 2 (*)    All other components within normal limits  LACTATE DEHYDROGENASE - Abnormal; Notable for the following components:   LDH 262 (*)    All other components within normal limits  BASIC METABOLIC PANEL - Abnormal; Notable for the following components:   Glucose, Bld 107 (*)    BUN 24 (*)    Creatinine, Ser 1.41 (*)    Calcium 8.0 (*)    GFR calc non Af Amer 38 (*)    GFR calc Af Amer 45 (*)    All other components within normal limits  CBC - Abnormal; Notable for the following components:   Hemoglobin 10.6 (*)    [facility]  25.1 (*)    MCHC 28.8 (*)    RDW 23.4 (*)    Platelets 131 (*)    All other components within normal limits  GLUCOSE, CAPILLARY - Abnormal; Notable for the following components:   Glucose-Capillary 251 (*)    All other components within normal limits  GLUCOSE, CAPILLARY - Abnormal; Notable for the following components:   Glucose-Capillary 272 (*)    All other components within normal limits  GLUCOSE, CAPILLARY - Abnormal; Notable for the following components:   Glucose-Capillary 306 (*)    All other components within normal limits  GLUCOSE, CAPILLARY - Abnormal; Notable for the following components:   Glucose-Capillary 325 (*)    All other components within normal limits  GLUCOSE, CAPILLARY - Abnormal; Notable for the following components:   Glucose-Capillary 289 (*)    All other components within normal limits  BASIC METABOLIC PANEL - Abnormal; Notable for the following components:   Sodium 132 (*)    CO2 21 (*)    Glucose, Bld 325 (*)    BUN 28 (*)    Creatinine, Ser 1.39 (*)    Calcium 8.0 (*)    GFR calc non Af  Amer 39 (*)    GFR calc Af Amer 45 (*)    All other components within normal limits  CBG MONITORING, ED - Abnormal; Notable for the following components:   Glucose-Capillary 61 (*)    All other components within normal limits  CBG MONITORING, ED - Abnormal; Notable for the following components:   Glucose-Capillary 107 (*)    All other components within normal limits  CBG MONITORING, ED - Abnormal; Notable for the following components:   Glucose-Capillary 119 (*)    All other components within normal limits  GRAM STAIN  CULTURE, BLOOD (ROUTINE X 2)  CULTURE, BLOOD (ROUTINE X 2)  CULTURE, BODY FLUID-BOTTLE  SARS CORONAVIRUS 2 (HOSPITAL ORDER, [facility])  MRSA PCR SCREENING  GLUCOSE, PLEURAL OR PERITONEAL FLUID  PROTEIN, PLEURAL OR PERITONEAL FLUID  ALBUMIN, PLEURAL OR PERITONEAL FLUID  BRAIN NATRIURETIC PEPTIDE  PATHOLOGIST SMEAR REVIEW  MAGNESIUM  LACTIC ACID, PLASMA  HIV ANTIBODY (ROUTINE TESTING W REFLEX)  HIV4GL SAVE TUBE    EKG EKG Interpretation  Date/Time:  Tuesday June 13 2019 22:30:40 EDT Ventricular Rate:  88 PR Interval:    QRS Duration: 151 QT Interval:  428 QTC Calculation: 518 R Axis:   -116 Text Interpretation:  Sinus rhythm Right bundle branch block Inferolateral infarct, old Since last tracing rate faster Confirmed by Noemi Chapel [phone]) on 06/13/2019 11:34:11 PM   Radiology No results found.  Procedures .Paracentesis  Date/Time: 06/13/2019 7:06 PM Performed by: Noemi Chapel, MD Authorized by: Noemi Chapel, MD   Consent:    Consent obtained:  Written   Consent given by:  Patient   Risks discussed:  Bleeding, bowel perforation, infection and pain   Alternatives discussed:  No treatment and delayed treatment Pre-procedure details:    Procedure purpose:  Therapeutic   Preparation: Patient was prepped and draped in usual sterile fashion   Anesthesia (see MAR for exact dosages):    Anesthesia method:  Local  infiltration   Local anesthetic:  Lidocaine 1% w/o epi Procedure details:    Needle gauge:  18   Ultrasound guidance: yes     Puncture site:  L lower quadrant   Fluid removed amount:  6300   Fluid appearance:  Cloudy   Dressing:  4x4 sterile gauze and adhesive bandage  Post-procedure details:    Patient tolerance of procedure:  Tolerated well, no immediate complications Comments:       Albumin ordered    .Critical Care Performed by: Noemi Chapel, MD Authorized by: Noemi Chapel, MD   Critical care provider statement:    Critical care time (minutes):  35   Critical care time was exclusive of:  Separately billable procedures and treating other patients and teaching time   Critical care was necessary to treat or prevent imminent or life-threatening deterioration of the following conditions:  Sepsis   Critical care was time spent personally by me on the following activities:  Blood draw for specimens, development of treatment plan with patient or surrogate, discussions with consultants, evaluation of patient's response to treatment, examination of patient, obtaining history from patient or surrogate, ordering and performing treatments and interventions, ordering and review of laboratory studies, ordering and review of radiographic studies, pulse oximetry, re-evaluation of patient's condition and review of old charts   (including critical care time)  Medications Ordered in ED Medications - No data to display   Initial Impression / Assessment and Plan / ED Course  I have reviewed the triage vital signs and the nursing notes.  Pertinent labs & imaging results that were available during my care of the patient were reviewed by me and considered in my medical decision making (see chart for details).  Clinical Course as of Jun 16 903  Tue Jun 13, 2019  2049 Acute kidney injury is seen with elevation of the creatinine, albumin is low, it is currently infusing   [BM]  2049 There is a  patchy left lower lobe pneumonia on the x-ray suspicious for pneumonia, this explains the patient's low oxygen levels.  We will give her more fluid resuscitation and treat this more like a sepsis, thankfully she does not have a leukocytosis   [BM]    Clinical Course User Index [BM] Noemi Chapel, MD       The patient has strong pulses, I suspect that she just needs to have paracentesis, will check for infection  The fluid in the paracentesis is objectively cloudy raising concern for possible spontaneous bacterial peritonitis.  She has no leukocytosis, her blood pressure is still slightly low but her mental status is normal.  Her labs are pending including cultures lactic acid and metabolic panel.  IV fluids are running for her hypotension which seem to slightly improved  Will admit to hospital Antibiotics ordered Fluid in abdomen is borderline for SBP  Final Clinical Impressions(s) / ED Diagnoses   Final diagnoses:  Hypotension, unspecified hypotension type  Community acquired pneumonia, unspecified laterality      Noemi Chapel, MD 06/16/19 [phone]

## 2019-06-13 NOTE — H&P (Signed)
History and Physical    Penny Martinez:[id number] DOB: 1951/01/20 DOA: 06/13/2019  PCP: Vidal Schwalbe, MD   Patient coming from: Home  I have personally briefly reviewed patient's old medical records in [place]  Chief Complaint:  Abdominal distension and pain.  HPI: Penny Martinez is a 68 y.o. female with medical history significant for hepatic cirrhosis, hypertension, diastolic CHF, diabetes mellitus 2.  Patient presented to the ED with complaints of progressive abdominal distention and difficulty breathing.  She also reports abdominal pain which was more of a tightness.  She was scheduled for paracentesis tomorrow but she could not wait hence she presented to the ED. She also reports leg swelling that has not significantly changed.  Reports a mild cough only when she is lying back due to her abdominal distention.  Denies chest pain.  No fevers or chills.  She reports dizziness at this time.  She reports abdominal pain and difficulty breathing have both significantly improved since the paracentesis in the ED.  She is compliant with Lasix and benazepril.  Denies vomiting or loose stools.  She reports she is getting a third course of antibiotics for urinary tract infection.  She is currently on the seventh day of a 7-day course of antibiotics she does not remember the name.   ED Course: Hypotensive with blood pressure systolic 54/56, O2 sats initially 98%, dropped to 86%.  Improved with O2 via nasal cannula.  Temperature 98.5.  Patient had urgent paracentesis in the ED with 6 L O2, fluid appeared cloudy hence cultures were drawn and patient was started on antibiotics.  Portable chest x-ray shows patchy left lung base consolidation, suspicious for pneumonia.  Patient was given IV aztreonam and metronidazole.  1 L bolus given.  COVID-19 test pending.  Review of Systems: As per HPI all other systems reviewed and negative.  Past Medical History:  Diagnosis Date  . Acid reflux   .  Anemia   . Arthritis   . C. difficile colitis June/July 2015  . Cancer ([place])    skin cancer  . CHF (congestive heart failure) ([place])   . 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 Negative viral markers 2015  . Complication of anesthesia   . COPD (chronic obstructive pulmonary disease) ([place])   . Diabetes mellitus without complication ([place])   . GERD (gastroesophageal reflux disease)   . History of kidney stones   . Hypercholesteremia   . Hypertension   . Hypothyroidism   . IBS (irritable bowel syndrome)   . Kidney disease   . Liver disease   . Neuropathy   . PONV (postoperative nausea and vomiting)   . Sleep apnea   . Thyroid disease     Past Surgical History:  Procedure Laterality Date  . ABDOMINAL HYSTERECTOMY    . APPENDECTOMY    . CAROTID ARTERY - SUBCLAVIAN ARTERY BYPASS GRAFT Right   . CARPAL TUNNEL RELEASE Right   . CARPAL TUNNEL RELEASE Left 12/03/2016   Procedure: CARPAL TUNNEL RELEASE;  Surgeon: Carole Civil, MD;  Location: [facility];  Service: Orthopedics;  Laterality: Left;  . CATARACT EXTRACTION Bilateral   . CHOLECYSTECTOMY    . COLONOSCOPY N/A 02/19/2014   Dr. Gala Romney: rectal varices, rectal polyp overlying a varix non-manipulated  . ESOPHAGEAL BANDING N/A 08/15/2018   Procedure: ESOPHAGEAL BANDING;  Surgeon: Daneil Dolin, MD;  Location: [facility];  Service: Endoscopy;  Laterality: N/A;  . ESOPHAGEAL BANDING N/A 09/15/2018   Procedure:  ESOPHAGEAL BANDING;  Surgeon: Daneil Dolin, MD;  Location: [facility];  Service: Endoscopy;  Laterality: N/A;  . ESOPHAGOGASTRODUODENOSCOPY N/A 02/19/2014   Dr. Gala Romney: 4 columns of grade 2 esophageal varices, multiple gastric polyps with largest biopsied and hyperplastic. Negative H.pylori  . ESOPHAGOGASTRODUODENOSCOPY N/A 04/13/2018   Dr. Gala Romney: Grade 3 esophageal varices, no esophagitis, portal gastropathy, multiple hyperplastic appearing polyps  . ESOPHAGOGASTRODUODENOSCOPY (EGD) WITH PROPOFOL N/A 08/15/2018   Procedure:  ESOPHAGOGASTRODUODENOSCOPY (EGD) WITH PROPOFOL;  Surgeon: Daneil Dolin, MD;  Location: [facility];  Service: Endoscopy;  Laterality: N/A;  1:45pm  . ESOPHAGOGASTRODUODENOSCOPY (EGD) WITH PROPOFOL N/A 09/15/2018   Dr. Gala Romney: Grade 2 esophageal varices, portal gastropathy, normal first and second duodenum, s/p banding X 5. Surveillance in 1 year  . GIVENS CAPSULE STUDY N/A 05/04/2014   multiple polypoid-like lesions throughout small bowel, scattered superficial non-bleeding erosions more distal bowel, referred to [facility] enteroscopy. Capsule study not complete, poor images.   Marland Kitchen TRIGGER FINGER RELEASE Left 12/03/2016   Procedure: RELEASE TRIGGER FINGER/A-1 PULLEY LEFT LONG TRIGGER FINGER RELEASE;  Surgeon: Carole Civil, MD;  Location: [facility];  Service: Orthopedics;  Laterality: Left;  . urosepsis       reports that she quit smoking about 16 years ago. She has a 70.00 pack-year smoking history. She has never used smokeless tobacco. She reports that she does not drink alcohol or use drugs.  Allergies  Allergen Reactions  . Erythromycin Hives  . Niacin Rash and Shortness Of Breath  . Penicillins Shortness Of Breath and Other (See Comments)    Has patient had a PCN reaction causing immediate rash, facial/tongue/throat swelling, SOB or lightheadedness with hypotension: Yes Has patient had a PCN reaction causing severe rash involving mucus membranes or skin necrosis: No Has patient had a PCN reaction that required hospitalization: No Has patient had a PCN reaction occurring within the last 10 years: No If all of the above answers are "NO", then may proceed with Cephalosporin use.   . Sulfa Antibiotics Hives  . Ciprofloxacin Itching and Nausea And Vomiting    Stomach pain  . Statins Other (See Comments)    Muscle Pain  . Codeine Itching  . Fluticasone Rash  . Tramadol Rash    Family History  Problem Relation Age of Onset  . Diabetes Mother   . Hypertension Mother   . Kidney  failure Mother   . Blindness Mother   . Aneurysm Father   . Colon cancer Neg Hx     Prior to Admission medications   Medication Sig Start Date End Date Taking? Authorizing Provider  amitriptyline (ELAVIL) 25 MG tablet Take 25 mg by mouth at bedtime.    [provider]  aspirin EC 81 MG tablet Take by mouth.    [provider]  Azelastine HCl 137 MCG/SPRAY SOLN Place 1 spray into both nostrils as needed (for congestion).  03/21/18   [provider]  benazepril (LOTENSIN) 20 MG tablet Take 20 mg by mouth daily. 07/08/18   [provider]  cephALEXin (KEFLEX) 500 MG capsule Take 500 mg by mouth 2 (two) times daily.    [provider]  Continuous Blood Gluc Sensor (FREESTYLE LIBRE 14 DAY SENSOR) MISC 1 each by Does not apply route every 14 (fourteen) days. 01/11/19   Cassandria Anger, MD  CRANBERRY PO Take 15,000 mg by mouth at bedtime.     [provider]  diphenhydrAMINE (SOMINEX) 25 MG tablet Take 50 mg by  mouth at bedtime.     [provider]  diphenoxylate-atropine (LOMOTIL) 2.5-0.025 MG tablet 1-2 tablets every 4-6 hours as needed for diarrhea. Patient not taking: Reported on 05/18/2019 04/28/19   Annitta Needs, NP  donepezil (ARICEPT) 5 MG tablet Take 5 mg by mouth at bedtime.  01/12/18   [provider]  ezetimibe (ZETIA) 10 MG tablet Take 10 mg by mouth every morning.     [provider]  fluconazole (DIFLUCAN) 150 MG tablet  06/13/19   [provider]  gabapentin (NEURONTIN) 100 MG capsule Take 100 mg by mouth 3 (three) times daily. 05/31/19   [provider]  gabapentin (NEURONTIN) 300 MG capsule Take 300 mg by mouth at bedtime.    [provider]  insulin regular human CONCENTRATED (HUMULIN R U-500 KWIKPEN) 500 UNIT/ML kwikpen Inject 80 units with breakfast and supper, 60 units with supper when premeal glucose is above 90 06/05/19   Nida, Marella Chimes, MD  lansoprazole (PREVACID)  30 MG capsule Take 1 capsule (30 mg total) by mouth 2 (two) times daily before a meal. Patient taking differently: Take 30 mg by mouth daily.  07/13/18   Mahala Menghini, PA-C  levofloxacin (LEVAQUIN) 500 MG tablet TK 1 T PO QD FOR 10 DAYS 02/12/19   [provider]  levothyroxine (SYNTHROID) 100 MCG tablet TAKE 1 TABLET BY MOUTH ONCE DAILY. 06/01/19   Cassandria Anger, MD  Multiple Vitamins-Minerals (CENTRUM SILVER ADULT 50+ PO) Take 1 tablet by mouth daily.    [provider]  nitrofurantoin, macrocrystal-monohydrate, (MACROBID) 100 MG capsule Take 100 mg by mouth 2 (two) times daily. 7 day course starting on 06/05/2019 06/05/19   [provider]  ondansetron (ZOFRAN) 4 MG tablet Take 1 tablet (4 mg total) by mouth 4 (four) times daily -  before meals and at bedtime. Patient not taking: Reported on 05/18/2019 02/22/19   Annitta Needs, NP  torsemide (DEMADEX) 20 MG tablet Take 2 tablets by mouth daily. 04/20/19 04/19/20  [provider]  [place] 18 MG/3ML SOPN INJECT 1.8MG SUBCUTANEOUSLY ONCE DAILY. 04/10/19   Cassandria Anger, MD  Vitamin D, Ergocalciferol, (DRISDOL) 50000 units CAPS capsule Take 50,000 Units by mouth every 30 (thirty) days.  01/20/18   [provider]    Physical Exam: Vitals:   06/13/19 1958 06/13/19 1959 06/13/19 2000 06/13/19 2035  BP:   96/64 (!) 88/58  Pulse: 85 85 86 88  Resp:      Temp:      TempSrc:      SpO2: (!) 87% 98% 99% 100%  Weight:      Height:        Constitutional: NAD, calm, comfortable Vitals:   06/13/19 1958 06/13/19 1959 06/13/19 2000 06/13/19 2035  BP:   96/64 (!) 88/58  Pulse: 85 85 86 88  Resp:      Temp:      TempSrc:      SpO2: (!) 87% 98% 99% 100%  Weight:      Height:       Eyes: PERRL, lids and conjunctivae normal ENMT: Mucous membranes are moist. Posterior pharynx clear of any exudate or lesions. Neck: normal, supple, no masses, no thyromegaly Respiratory: clear to auscultation  bilaterally, no wheezing, no crackles. Normal respiratory effort. No accessory muscle use.  Cardiovascular: Regular rate and rhythm, no murmurs / rubs / gallops. 1+ pitting extremity edema to upper legs. 2+ pedal pulses. No carotid bruits.  Abdomen: Mild diffuse  tenderness , no masses palpated. No hepatosplenomegaly. Bowel sounds positive.  Musculoskeletal: no clubbing / cyanosis. No joint deformity upper and lower extremities. Good ROM, no contractures. Normal muscle tone.  Skin: no rashes, lesions, ulcers. No induration Neurologic: CN 2-12 grossly intact.  Strength 5/5 in all 4.  Psychiatric: Normal judgment and insight. Alert and oriented x 3. Normal mood.   Labs on Admission: I have personally reviewed following labs and imaging studies  CBC: Recent Labs  Lab 06/13/19 1830  WBC 9.6  NEUTROABS 6.8  HGB 12.1  HCT 41.0  MCV 86.9  PLT 701   Basic Metabolic Panel: Recent Labs  Lab 06/13/19 1830  NA 132*  K 4.3  CL 98  CO2 24  GLUCOSE 157*  BUN 23  CREATININE 1.65*  CALCIUM 8.3*   Liver Function Tests: Recent Labs  Lab 06/13/19 1830  AST 42*  ALT 19  ALKPHOS 110  BILITOT 0.6  PROT 6.6  ALBUMIN 2.7*   Coagulation Profile: Recent Labs  Lab 06/13/19 1926  INR 1.3*    Radiological Exams on Admission: Dg Chest Port 1 View  Result Date: 06/13/2019 CLINICAL DATA:  Cough and dyspnea EXAM: PORTABLE CHEST 1 VIEW COMPARISON:  05/23/2018 chest radiograph. FINDINGS: Stable cardiomediastinal silhouette with top-normal heart size. No pneumothorax. Small left pleural effusion. No right pleural effusion. Patchy left lung base consolidation. No pulmonary edema. IMPRESSION: 1. Patchy left lung base consolidation suspicious for pneumonia. Recommend follow-up PA and lateral post treatment chest radiographs in 4-6 weeks. 2. Small left pleural effusion. Electronically Signed   By: Ilona Sorrel M.D.   On: 06/13/2019 20:45    EKG: Independently reviewed.   Assessment/Plan Active  Problems:   CAP (community acquired pneumonia)    Hepatic cirrhosis with ascites- 6 L cloudy fluid drawn in ED.  Per care everywhere paracentesis 02/2019 with 4 L drawn.  Gram stain negative for organisms. -Ascitic fluid LDH elevated at 57, will check serum LDH to determine ascitic fluid/serum ratio if  >1 more suggestive of SBP. -Follow-up ascitic fluid culture -Hold home Lasix 80 mg for now with hypotension. -Cover prophylactically with antibiotics pending culture, IV ceftriaxone 2 g daily -IV albumin 50 g  Community acquired pneumonia-hypoxia O2 sats down to 86% on room air improved with via nasal cannula.  Reported difficulty breathing more from ascites, mild cough.  Afebrile, WBC 9.6.  With lactic acidosis 4.3 and hypotension which likely are from other etiologies. Port Chest xray- patchy left lung base consolidation. -IV ceftriaxone and doxycycline IV (patient has tolerated cephalosporins in the recent past despite allergy list documentation, also prolonged QTC on allergy to azithromycin hence doxycycline.). -Follow-up blood cultures -Supplemental O2  Acute kidney injury-  Cr 1.65, baseline 0.9-1.  Likely pre-renal from hypotension, in the setting of Lasix 80 mg daily and benazepril use. -Hold Lasix and benazepril -Hydrate  Hypotension with lactic acidosis-4.3.  Blood pressure systolic mid to high 77L.  Etiology likely due hypertension associated with liver cirrhosis versus community-acquired pneumonia versus to large volume paracentesis, or a combination of all 3.  Lactic acidosis likely due to poor perfusion from hypotension versus infection. -Goal map greater than 65 -1 L bolus given, repeat 1 L bolus - Continue N/s 75cc/hr x 1 day -IV Albumin 50g -Trend lactic acid - EKG-sinus rhythm with prolonged QTC of 518.  Old right bundle branch block.  No significant change from prior.  Prolonged QTC-518.  Potassium normal 4.3.  Currently on antibiotic for UTI- ?  Quinolones. -Check  magnesium -Avoid QT prolonging medications  Diabetes mellitus- HgbA1c 9/23 - 7.  She reports low blood sugars earlier today.  Fingersticks now showed glucose 61. - SSI for now -Hold home Humulin insulin 60 - 80u.  -Hold home Victoza  Hypertension-currently hypotensive. -Hold antihypertensives.  Diastolic CHF-stable and compensated.  Has chronic lower extremity swelling that is unchanged.  Currently with significant lactic acidosis of 4.3.  Per care everywhere stress echo 04/2019, showed mild LVH, I do not see a comment on EF. -Hold diuretics.   DVT prophylaxis: SCDs for now with recent paracentesis. Code Status: Full code Family Communication: None at bedside. Disposition Plan: Per rounding team. Consults called: None. Admission status: Inpatient, stepdown. I certify that at the point of admission it is my clinical judgment that the patient will require inpatient hospital care spanning beyond 2 midnights from the point of admission due to high intensity of service, high risk for further deterioration and high frequency of surveillance required. The following factors support the patient status of inpatient: Hypertension and significant lactic acidosis with community-acquired pneumonia and acute kidney injury requiring IV antibiotics and close monitoring.   Bethena Roys MD [facility]  06/13/2019, 10:38 PM

## 2019-06-13 NOTE — ED Triage Notes (Signed)
EMS reports pt is scheduled to have a paracentesis tomorrow in ultra sound.  Reports unable to tolerate waiting.  Reports abd distention and sob.  Pt sees Dr. Gala Romney and was instructed to come to [facility].  Pt finished antibiotic for UTI today.   CBG 258.

## 2019-06-13 NOTE — ED Notes (Signed)
Pt O2 sat ranging 87-88% on RA. Pt placed on 2L of O2 per [place] and O2 sat increased to 93-94%. EDP made aware.

## 2019-06-13 NOTE — Telephone Encounter (Signed)
Patient called in requesting for AB to call her. She states she is in a lot of pain and she is swollen. Please call pt.

## 2019-06-13 NOTE — ED Notes (Addendum)
Penny Martinez [phone] Per pt ok to speak with daughter about medical care.

## 2019-06-13 NOTE — ED Notes (Signed)
Pt given crackers and peanut butter.

## 2019-06-13 NOTE — Progress Notes (Signed)
Pharmacy Antibiotic Note  Penny Martinez is a 68 y.o. female with cirrhosis admitted on 06/13/2019 with possible SBP.  Pharmacy has been consulted for aztreonam dosing since penicillin allergy listed.  Plan: Aztrenonam 2g IV q8h Monitor renal function closely, adjust interval as needed *Per review of previous antibiotic administration, patient has received ceftriaxone and ceftazidime - consider changing to cephalosporin therapy.  Height: 5' 1"  (154.9 cm) Weight: 234 lb (106.1 kg) IBW/kg (Calculated) : 47.8  Temp (24hrs), Avg:98.5 F (36.9 C), Min:98.5 F (36.9 C), Max:98.5 F (36.9 C)  Recent Labs  Lab 06/13/19 1830  WBC 9.6  CREATININE 1.65*    Estimated Creatinine Clearance: 37.1 mL/min (A) (by C-G formula based on SCr of 1.65 mg/dL (H)).    Allergies  Allergen Reactions  . Erythromycin Hives  . Niacin Rash and Shortness Of Breath  . Penicillins Shortness Of Breath and Other (See Comments)    Has patient had a PCN reaction causing immediate rash, facial/tongue/throat swelling, SOB or lightheadedness with hypotension: Yes Has patient had a PCN reaction causing severe rash involving mucus membranes or skin necrosis: No Has patient had a PCN reaction that required hospitalization: No Has patient had a PCN reaction occurring within the last 10 years: No If all of the above answers are "NO", then may proceed with Cephalosporin use.   . Sulfa Antibiotics Hives  . Ciprofloxacin Itching and Nausea And Vomiting    Stomach pain  . Statins Other (See Comments)    Muscle Pain  . Codeine Itching  . Fluticasone Rash  . Tramadol Rash    Antimicrobials this admission: 10/6 Aztreonam >> 10/6 Flagyl >>  Dose adjustments this admission:   Microbiology results: 10/6 BCx: sent 10/6 Peritoneal fluid: 10/6 SARS CoV-2: sent  Thank you for allowing pharmacy to be a part of this patient's care.  Peggyann Juba, PharmD, BCPS 06/13/2019 9:18 PM

## 2019-06-13 NOTE — ED Notes (Signed)
CRITICAL VALUE ALERT  Critical Value:  4.3 Lactic acid  Date & Time Notied:  06/13/2019 2149  Provider Notified: Dr. Denton Brick  Orders Received/Actions taken: see chart

## 2019-06-13 NOTE — ED Notes (Signed)
Radiology at bedside

## 2019-06-13 NOTE — Telephone Encounter (Signed)
Called pt back. Pt is out of breath as she's talking and in a lot of pain. Pt is due for a para tomorrow and can't wait. Pt's husband has already called EMS. Pt is aware that AB will be notified that she is going to the ED.

## 2019-06-13 NOTE — ED Notes (Signed)
Pt given dinner tray.

## 2019-06-14 ENCOUNTER — Ambulatory Visit (HOSPITAL_COMMUNITY): Payer: Medicare Other

## 2019-06-14 LAB — CBC
HCT: 36.8 % (ref 36.0–46.0)
Hemoglobin: 10.6 g/dL — ABNORMAL LOW (ref 12.0–15.0)
MCH: 25.1 pg — ABNORMAL LOW (ref 26.0–34.0)
MCHC: 28.8 g/dL — ABNORMAL LOW (ref 30.0–36.0)
MCV: 87 fL (ref 80.0–100.0)
Platelets: 131 10*3/uL — ABNORMAL LOW (ref 150–400)
RBC: 4.23 MIL/uL (ref 3.87–5.11)
RDW: 23.4 % — ABNORMAL HIGH (ref 11.5–15.5)
WBC: 5.7 10*3/uL (ref 4.0–10.5)
nRBC: 0 % (ref 0.0–0.2)

## 2019-06-14 LAB — HIV ANTIBODY (ROUTINE TESTING W REFLEX): HIV Screen 4th Generation wRfx: NONREACTIVE

## 2019-06-14 LAB — MRSA PCR SCREENING: MRSA by PCR: NEGATIVE

## 2019-06-14 LAB — URINALYSIS, ROUTINE W REFLEX MICROSCOPIC
Bilirubin Urine: NEGATIVE
Glucose, UA: NEGATIVE mg/dL
Hgb urine dipstick: NEGATIVE
Ketones, ur: NEGATIVE mg/dL
Nitrite: NEGATIVE
Protein, ur: NEGATIVE mg/dL
Specific Gravity, Urine: 1.013 (ref 1.005–1.030)
pH: 5 (ref 5.0–8.0)

## 2019-06-14 LAB — BASIC METABOLIC PANEL
Anion gap: 10 (ref 5–15)
BUN: 24 mg/dL — ABNORMAL HIGH (ref 8–23)
CO2: 25 mmol/L (ref 22–32)
Calcium: 8 mg/dL — ABNORMAL LOW (ref 8.9–10.3)
Chloride: 102 mmol/L (ref 98–111)
Creatinine, Ser: 1.41 mg/dL — ABNORMAL HIGH (ref 0.44–1.00)
GFR calc Af Amer: 45 mL/min — ABNORMAL LOW (ref >60)
GFR calc non Af Amer: 38 mL/min — ABNORMAL LOW (ref >60)
Glucose, Bld: 107 mg/dL — ABNORMAL HIGH (ref 70–99)
Potassium: 4.6 mmol/L (ref 3.5–5.1)
Sodium: 137 mmol/L (ref 135–145)

## 2019-06-14 LAB — CBG MONITORING, ED
Glucose-Capillary: 107 mg/dL — ABNORMAL HIGH (ref 70–99)
Glucose-Capillary: 119 mg/dL — ABNORMAL HIGH (ref 70–99)

## 2019-06-14 LAB — LACTIC ACID, PLASMA: Lactic Acid, Venous: 1.4 mmol/L (ref 0.5–1.9)

## 2019-06-14 LAB — GLUCOSE, CAPILLARY
Glucose-Capillary: 251 mg/dL — ABNORMAL HIGH (ref 70–99)
Glucose-Capillary: 272 mg/dL — ABNORMAL HIGH (ref 70–99)
Glucose-Capillary: 306 mg/dL — ABNORMAL HIGH (ref 70–99)

## 2019-06-14 MED ORDER — CHLORHEXIDINE GLUCONATE CLOTH 2 % EX PADS
6.0000 | MEDICATED_PAD | Freq: Every day | CUTANEOUS | Status: DC
  Administered 2019-06-14 – 2019-06-15 (×2): 6 via TOPICAL

## 2019-06-14 MED ORDER — LEVOTHYROXINE SODIUM 100 MCG PO TABS
100.0000 ug | ORAL_TABLET | Freq: Every day | ORAL | Status: DC
  Administered 2019-06-15: 07:00:00 100 ug via ORAL
  Filled 2019-06-14: qty 1

## 2019-06-14 MED ORDER — HEPARIN SODIUM (PORCINE) 5000 UNIT/ML IJ SOLN
5000.0000 [IU] | Freq: Three times a day (TID) | INTRAMUSCULAR | Status: DC
  Administered 2019-06-14 – 2019-06-15 (×3): 5000 [IU] via SUBCUTANEOUS
  Filled 2019-06-14 (×2): qty 1

## 2019-06-14 MED ORDER — INFLUENZA VAC A&B SA ADJ QUAD 0.5 ML IM PRSY
0.5000 mL | PREFILLED_SYRINGE | INTRAMUSCULAR | Status: AC
  Administered 2019-06-15: 09:00:00 0.5 mL via INTRAMUSCULAR
  Filled 2019-06-14: qty 0.5

## 2019-06-14 NOTE — Progress Notes (Signed)
Patient Demographics:    Penny Martinez, is a 68 y.o. female, DOB - [birth year], HTD:[id number]  Admit date - 06/13/2019   Admitting Physician Ejiroghene Arlyce Dice, MD  Outpatient Primary MD for the patient is Vidal Schwalbe, MD  LOS - 1   Chief Complaint  Patient presents with  . Abdominal Pain        Subjective:    Penny Martinez today has no fevers, no emesis,  No chest pain, shortness of breath and abdominal discomfort has improved, -  Assessment  & Plan :    Active Problems:   CAP (community acquired pneumonia)  Brief Summary 68 y.o. female with medical history significant for hepatic cirrhosis, hypertension, diastolic CHF, diabetes mellitus 2 admitted on 06/13/2019 with concerns about pneumonia and possible SBP in the setting of ascites   A/p 1)CAP--improving respiratory status improving oxygen requirement,, no fevers no leukocytosis.  Continue IV Rocephin and doxycycline  2) liver cirrhosis with recurrent ascites---??  SBP--ascitic fluid culture from 05/31/2019 without growth, ascitic fluid Gram stain and culture from 06/2019 NGTD -Currently on antibiotics for pneumonia as above -WBC 5.7  3)AKI----acute kidney injury due to dehydration compounded by benazepril and torsemide use,    creatinine on admission= 1.65 ,   baseline creatinine =1.0 ( 05/31/19)    , creatinine is now=1.41  , renally adjust medications, avoid nephrotoxic agents/dehydration/hypotension -Hold benazepril -Hold torsemide  4)HFpEF--no flareup at this time, diuretics on hold due to AKI and soft blood pressures  5)DM2-A1c 7.0 05/31/2019, hold scheduled insulin and Victoza until oral intake improves use Novolog/Humalog Sliding scale insulin with Accu-Cheks/Fingersticks as ordered   Disposition/Need for in-Hospital Stay- patient unable to be discharged at this time due to pneumonia and possible SBP requiring IV antibiotics  pending further culture data  Code Status : Full  Family Communication:   NA (patient is alert, awake and coherent)   Disposition Plan  : TBD  Consults  :  na  DVT Prophylaxis  :    - Heparin - SCDs*  Lab Results  Component Value Date   PLT 131 (L) 06/14/2019    Inpatient Medications  Scheduled Meds: . Chlorhexidine Gluconate Cloth  6 each Topical Daily  . [START ON 06/15/2019] influenza vaccine adjuvanted  0.5 mL Intramuscular Tomorrow-1000  . insulin aspart  0-15 Units Subcutaneous TID WC  . insulin aspart  0-5 Units Subcutaneous QHS   Continuous Infusions: . sodium chloride 75 mL/hr at 06/14/19 1316  . cefTRIAXone (ROCEPHIN)  IV Stopped (06/13/19 2331)  . doxycycline (VIBRAMYCIN) IV 100 mg (06/14/19 1320)   PRN Meds:.acetaminophen **OR** acetaminophen, ondansetron **OR** ondansetron (ZOFRAN) IV, polyethylene glycol    Anti-infectives (From admission, onward)   Start     Dose/Rate Route Frequency Ordered Stop   06/14/19 0600  aztreonam (AZACTAM) 2 g in sodium chloride 0.9 % 100 mL IVPB  Status:  Discontinued     2 g 200 mL/hr over 30 Minutes Intravenous Every 8 hours 06/13/19 2101 06/13/19 2131   06/14/19 0600  cefTRIAXone (ROCEPHIN) 2 g in sodium chloride 0.9 % 100 mL IVPB  Status:  Discontinued     2 g 200 mL/hr over 30 Minutes Intravenous Every 24 hours 06/13/19 2131 06/13/19 2229   06/13/19 2330  doxycycline (VIBRAMYCIN)  100 mg in sodium chloride 0.9 % 250 mL IVPB     100 mg 125 mL/hr over 120 Minutes Intravenous Every 12 hours 06/13/19 2229     06/13/19 2300  cefTRIAXone (ROCEPHIN) 2 g in sodium chloride 0.9 % 100 mL IVPB     2 g 200 mL/hr over 30 Minutes Intravenous Daily at bedtime 06/13/19 2229     06/13/19 2100  aztreonam (AZACTAM) 2 g in sodium chloride 0.9 % 100 mL IVPB     2 g 200 mL/hr over 30 Minutes Intravenous  Once 06/13/19 2051 06/13/19 2152   06/13/19 2100  metroNIDAZOLE (FLAGYL) IVPB 500 mg     500 mg 100 mL/hr over 60 Minutes Intravenous   Once 06/13/19 2051 06/13/19 2256        Objective:   Vitals:   06/14/19 1200 06/14/19 1300 06/14/19 1500 06/14/19 1733  BP: 122/90 (!) 149/74 118/81   Pulse: 93 89    Resp: 13 14 20    Temp:    97.8 F (36.6 C)  TempSrc:      SpO2: 95% 99%    Weight:      Height:        Wt Readings from Last 3 Encounters:  06/13/19 106.1 kg  05/18/19 102.5 kg  04/28/19 102.3 kg     Intake/Output Summary (Last 24 hours) at 06/14/2019 1837 Last data filed at 06/14/2019 1538 Gross per 24 hour  Intake 5314.61 ml  Output 120 ml  Net 5194.61 ml     Physical Exam  Gen:- Awake Alert,  In no apparent distress  HEENT:- [place].AT, No sclera icterus Neck-Supple Neck,No JVD,.  Lungs-  CTAB , fair symmetrical air movement CV- S1, S2 normal, regular  Abd-  +ve B.Sounds, Abd Soft, slight tenderness without rebound Extremity/Skin:-1+ edema, pedal pulses present  Psych-affect is appropriate, oriented x3 Neuro-no new focal deficits, no tremors   Data Review:   Micro Results Recent Results (from the past 240 hour(s))  Gram stain     Status: None   Collection Time: 06/13/19  6:09 PM   Specimen: Peritoneal Cavity; Body Fluid  Result Value Ref Range Status   Specimen Description PERITONEAL CAVITY  Final   Special Requests NONE  Final   Gram Stain   Final    CYTOSPIN SMEAR WBC PRESENT, PREDOMINANTLY MONONUCLEAR NO ORGANISMS SEEN Performed at [facility], [address] [place], [place] [address]    Report Status 06/13/2019 FINAL  Final  Blood culture (routine x 2)     Status: None (Preliminary result)   Collection Time: 06/13/19  6:30 PM   Specimen: BLOOD LEFT HAND  Result Value Ref Range Status   Specimen Description BLOOD LEFT HAND  Final   Special Requests   Final    BOTTLES DRAWN AEROBIC AND ANAEROBIC Blood Culture adequate volume   Culture   Final    NO GROWTH < 12 HOURS Performed at [facility], [address], [place], [place] [address]    Report Status PENDING  Incomplete  Blood  culture (routine x 2)     Status: None (Preliminary result)   Collection Time: 06/13/19  6:49 PM   Specimen: BLOOD RIGHT HAND  Result Value Ref Range Status   Specimen Description BLOOD RIGHT HAND  Final   Special Requests   Final    BOTTLES DRAWN AEROBIC ONLY Blood Culture adequate volume   Culture   Final    NO GROWTH < 12 HOURS Performed at [facility], [place]  [address], [place], [place] [address]    Report Status PENDING  Incomplete  Culture, body fluid-bottle     Status: None (Preliminary result)   Collection Time: 06/13/19  7:35 PM   Specimen: Peritoneal Washings  Result Value Ref Range Status   Specimen Description PERITONEAL  Final   Special Requests BOTTLES DRAWN AEROBIC AND ANAEROBIC  Final   Culture   Final    NO GROWTH < 12 HOURS Performed at [facility], [address], [place], [place] [address]    Report Status PENDING  Incomplete  SARS Coronavirus 2 [facility] order, Performed in [facility] hospital lab) Nasopharyngeal Nasopharyngeal Swab     Status: None   Collection Time: 06/13/19  8:00 PM   Specimen: Nasopharyngeal Swab  Result Value Ref Range Status   SARS Coronavirus 2 NEGATIVE NEGATIVE Final    Comment: (NOTE) If result is NEGATIVE SARS-CoV-2 target nucleic acids are NOT DETECTED. The SARS-CoV-2 RNA is generally detectable in upper and lower  respiratory specimens during the acute phase of infection. The lowest  concentration of SARS-CoV-2 viral copies this assay can detect is 250  copies / mL. A negative result does not preclude SARS-CoV-2 infection  and should not be used as the sole basis for treatment or other  patient management decisions.  A negative result may occur with  improper specimen collection / handling, submission of specimen other  than nasopharyngeal swab, presence of viral mutation(s) within the  areas targeted by this assay, and inadequate number of viral copies  (<250 copies / mL). A negative result must be combined with clinical   observations, patient history, and epidemiological information. If result is POSITIVE SARS-CoV-2 target nucleic acids are DETECTED. The SARS-CoV-2 RNA is generally detectable in upper and lower  respiratory specimens dur ing the acute phase of infection.  Positive  results are indicative of active infection with SARS-CoV-2.  Clinical  correlation with patient history and other diagnostic information is  necessary to determine patient infection status.  Positive results do  not rule out bacterial infection or co-infection with other viruses. If result is PRESUMPTIVE POSTIVE SARS-CoV-2 nucleic acids MAY BE PRESENT.   A presumptive positive result was obtained on the submitted specimen  and confirmed on repeat testing.  While 2019 novel coronavirus  (SARS-CoV-2) nucleic acids may be present in the submitted sample  additional confirmatory testing may be necessary for epidemiological  and / or clinical management purposes  to differentiate between  SARS-CoV-2 and other Sarbecovirus currently known to infect humans.  If clinically indicated additional testing with an alternate test  methodology [phone]) is advised. The SARS-CoV-2 RNA is generally  detectable in upper and lower respiratory sp ecimens during the acute  phase of infection. The expected result is Negative. Fact Sheet for Patients:  StrictlyIdeas.no Fact Sheet for Healthcare Providers: BankingDealers.co.za This test is not yet approved or cleared by the [place] FDA and has been authorized for detection and/or diagnosis of SARS-CoV-2 by FDA under an Emergency Use Authorization (EUA).  This EUA will remain in effect (meaning this test can be used) for the duration of the COVID-19 declaration under Section 564(b)(1) of the Act, 21 U.S.C. section 360bbb-3(b)(1), unless the authorization is terminated or revoked sooner. Performed at [facility], [address]  [place], [place] [address]     Radiology Reports US Abdomen Limited  Result Date: 05/18/2019 CLINICAL DATA:  Cirrhosis, ascites EXAM: LIMITED ABDOMEN ULTRASOUND FOR ASCITES TECHNIQUE: Limited ultrasound survey for ascites was performed in all four  abdominal quadrants. COMPARISON:  05/01/2019 FINDINGS: Only small volume ascites is identified upon survey imaging of the abdomen. Paracentesis not performed. IMPRESSION: Paracentesis not performed. Electronically Signed   By: Lavonia Dana M.D.   On: 05/18/2019 12:06   US Paracentesis  Result Date: 05/31/2019 INDICATION: Recurrent ascites EXAM: ULTRASOUND GUIDED Left PARACENTESIS MEDICATIONS: 10 cc 1% lidocaine COMPLICATIONS: None immediate. PROCEDURE: Informed written consent was obtained from the patient after a discussion of the risks, benefits and alternatives to treatment. A timeout was performed prior to the initiation of the procedure. Initial ultrasound scanning demonstrates a large amount of ascites within the left lower abdominal quadrant. The left lower abdomen was prepped and draped in the usual sterile fashion. 1% lidocaine was used for local anesthesia. Following this, a 19 gauge Yueh catheter was introduced. An ultrasound image was saved for documentation purposes. The paracentesis was performed. The catheter was removed and a dressing was applied. The patient tolerated the procedure well without immediate post procedural complication. Patient did not receive post-procedure intravenous albumin per MD. FINDINGS: A total of approximately 3.6 of yellow fluid was removed. Samples were sent to the laboratory as requested by the clinical team. IMPRESSION: Successful ultrasound-guided paracentesis yielding 3.6 liters of peritoneal fluid. Read by Lavonia Drafts [facility] Electronically Signed   By: Lavonia Dana M.D.   On: 05/31/2019 13:43   Dg Chest Port 1 View  Result Date: 06/13/2019 CLINICAL DATA:  Cough and dyspnea EXAM: PORTABLE CHEST 1 VIEW COMPARISON:   05/23/2018 chest radiograph. FINDINGS: Stable cardiomediastinal silhouette with top-normal heart size. No pneumothorax. Small left pleural effusion. No right pleural effusion. Patchy left lung base consolidation. No pulmonary edema. IMPRESSION: 1. Patchy left lung base consolidation suspicious for pneumonia. Recommend follow-up PA and lateral post treatment chest radiographs in 4-6 weeks. 2. Small left pleural effusion. Electronically Signed   By: Ilona Sorrel M.D.   On: 06/13/2019 20:45     CBC Recent Labs  Lab 06/13/19 1830 06/14/19 0558  WBC 9.6 5.7  HGB 12.1 10.6*  HCT 41.0 36.8  PLT 203 131*  MCV 86.9 87.0  MCH 25.6* 25.1*  MCHC 29.5* 28.8*  RDW 23.2* 23.4*  LYMPHSABS 1.6  --   MONOABS 0.9  --   EOSABS 0.1  --   BASOSABS 0.0  --     Chemistries  Recent Labs  Lab 06/13/19 1830 06/14/19 0558  NA 132* 137  K 4.3 4.6  CL 98 102  CO2 24 25  GLUCOSE 157* 107*  BUN 23 24*  CREATININE 1.65* 1.41*  CALCIUM 8.3* 8.0*  MG 2.3  --   AST 42*  --   ALT 19  --   ALKPHOS 110  --   BILITOT 0.6  --    ------------------------------------------------------------------------------------------------------------------ No results for input(s): CHOL, HDL, LDLCALC, TRIG, CHOLHDL, LDLDIRECT in the last 72 hours.  Lab Results  Component Value Date   HGBA1C 7.0 (H) 05/31/2019   ------------------------------------------------------------------------------------------------------------------ No results for input(s): TSH, T4TOTAL, T3FREE, THYROIDAB in the last 72 hours.  Invalid input(s): FREET3 ------------------------------------------------------------------------------------------------------------------ No results for input(s): VITAMINB12, FOLATE, FERRITIN, TIBC, IRON, RETICCTPCT in the last 72 hours.  Coagulation profile Recent Labs  Lab 06/13/19 1926  INR 1.3*    No results for input(s): DDIMER in the last 72 hours.  Cardiac Enzymes No results for input(s): CKMB,  TROPONINI, MYOGLOBIN in the last 168 hours.  Invalid input(s): CK ------------------------------------------------------------------------------------------------------------------    Component Value Date/Time   BNP 34.0 06/13/2019 1926  Roxan Hockey M.D on 06/14/2019 at 6:37 PM  Go to [email or web address] - for contact info  [facility] - Office  [phone]       \

## 2019-06-14 NOTE — ED Notes (Signed)
O2 removed. O2 sats 99-100%

## 2019-06-14 NOTE — Progress Notes (Signed)
Assisted tele visit to patient with daughter.  Maryelizabeth Rowan, RN

## 2019-06-14 NOTE — Progress Notes (Addendum)
Inpatient Diabetes Program Recommendations  AACE/ADA: New Consensus Statement on Inpatient Glycemic Control (2015)  Target Ranges:  Prepandial:   less than 140 mg/dL      Peak postprandial:   less than 180 mg/dL (1-2 hours)      Critically ill patients:  140 - 180 mg/dL   Lab Results  Component Value Date   GLUCAP 251 (H) 06/14/2019   HGBA1C 7.0 (H) 05/31/2019    Review of Glycemic Control  Diabetes history: DM2 Outpatient Diabetes medications: U-500 60-80 units bid and Victoza 1.8 mg QD Current orders for Inpatient glycemic control: Novolog 0-15 units tidwc and 0-5 units QHS  HgbA1C - 7%. Had mild hypo last night. Will likely need part of U-500 insulin  Inpatient Diabetes Program Recommendations:     U-500 15 units bid (breakfast and supper) if eating well.  Titrate as blood sugars increase.  Continue to follow.  Thank you. Lorenda Peck, RD, LDN, CDE Inpatient Diabetes Coordinator [phone]

## 2019-06-14 NOTE — Progress Notes (Signed)
Pt report pain in right shoulder. Warm and tender to touch. IV in hand reports some tenderness. Removed. Heat pack placed on shoulder. Will notify RN caring for pt

## 2019-06-15 LAB — BASIC METABOLIC PANEL
Anion gap: 11 (ref 5–15)
BUN: 28 mg/dL — ABNORMAL HIGH (ref 8–23)
CO2: 21 mmol/L — ABNORMAL LOW (ref 22–32)
Calcium: 8 mg/dL — ABNORMAL LOW (ref 8.9–10.3)
Chloride: 100 mmol/L (ref 98–111)
Creatinine, Ser: 1.39 mg/dL — ABNORMAL HIGH (ref 0.44–1.00)
GFR calc Af Amer: 45 mL/min — ABNORMAL LOW (ref >60)
GFR calc non Af Amer: 39 mL/min — ABNORMAL LOW (ref >60)
Glucose, Bld: 325 mg/dL — ABNORMAL HIGH (ref 70–99)
Potassium: 4.4 mmol/L (ref 3.5–5.1)
Sodium: 132 mmol/L — ABNORMAL LOW (ref 135–145)

## 2019-06-15 LAB — PATHOLOGIST SMEAR REVIEW

## 2019-06-15 LAB — GLUCOSE, CAPILLARY
Glucose-Capillary: 325 mg/dL — ABNORMAL HIGH (ref 70–99)
Glucose-Capillary: 289 mg/dL — ABNORMAL HIGH (ref 70–99)

## 2019-06-15 MED ORDER — ONDANSETRON HCL 4 MG PO TABS: 4.0000 mg | ORAL_TABLET | Freq: Three times a day (TID) | ORAL | 1 refills | Status: AC

## 2019-06-15 MED ORDER — ASPIRIN EC 81 MG PO TBEC: 81.0000 mg | DELAYED_RELEASE_TABLET | Freq: Every day | ORAL | 2 refills | Status: AC

## 2019-06-15 MED ORDER — TORSEMIDE 20 MG PO TABS: 20.0000 mg | ORAL_TABLET | Freq: Every day | ORAL | 2 refills | Status: DC

## 2019-06-15 MED ORDER — FLUCONAZOLE 150 MG PO TABS: 150.0000 mg | ORAL_TABLET | Freq: Once | ORAL | 0 refills | Status: AC

## 2019-06-15 MED ORDER — DOXYCYCLINE HYCLATE 100 MG PO TABS: 100.0000 mg | ORAL_TABLET | Freq: Two times a day (BID) | ORAL | 0 refills | Status: AC

## 2019-06-15 MED ORDER — CEFDINIR 300 MG PO CAPS: 300.0000 mg | ORAL_CAPSULE | Freq: Two times a day (BID) | ORAL | 0 refills | Status: AC

## 2019-06-15 MED ORDER — HUMULIN R U-500 KWIKPEN 500 UNIT/ML ~~LOC~~ SOPN: PEN_INJECTOR | SUBCUTANEOUS | 11 refills | Status: DC

## 2019-06-15 MED ORDER — FLUCONAZOLE 100 MG PO TABS
200.0000 mg | ORAL_TABLET | Freq: Once | ORAL | Status: AC
  Administered 2019-06-15: 17:00:00 200 mg via ORAL
  Filled 2019-06-15: qty 2

## 2019-06-15 MED ORDER — CEFDINIR 300 MG PO CAPS
300.0000 mg | ORAL_CAPSULE | Freq: Once | ORAL | Status: AC
  Administered 2019-06-15: 17:00:00 300 mg via ORAL
  Filled 2019-06-15: qty 1

## 2019-06-15 MED ORDER — DIPHENHYDRAMINE HCL 50 MG/ML IJ SOLN
25.0000 mg | Freq: Once | INTRAMUSCULAR | Status: AC
  Administered 2019-06-15: 14:00:00 25 mg via INTRAVENOUS
  Filled 2019-06-15: qty 1

## 2019-06-15 MED ORDER — BENAZEPRIL HCL 5 MG PO TABS: 5.0000 mg | ORAL_TABLET | Freq: Every day | ORAL | 2 refills | Status: DC

## 2019-06-15 NOTE — Discharge Summary (Signed)
Penny Martinez, is a 68 y.o. female  DOB November 17, 1950  MRN [id number].  Admission date:  06/13/2019  Admitting Physician  Bethena Roys, MD  Discharge Date:  06/15/2019   Primary MD  Vidal Schwalbe, MD  Recommendations for primary care physician for things to follow:   - 1) follow-up to primary care physician and gastroenterologist as previously advised 2) you may need repeat Paracentesis (removal of Fluid from your belly) from time to time (probably every 2 weeks) 3) please complete Omnicef and doxycycline antibiotics for pneumonia 4) your insulin doses have been reduced to avoid low blood sugar--  Admission Diagnosis  Abdominal Pain; Shortness of Breath   Discharge Diagnosis  Abdominal Pain; Shortness of Breath   Active Problems:   CAP (community acquired pneumonia)      Past Medical History:  Diagnosis Date  . Acid reflux   . Anemia   . Arthritis   . C. difficile colitis June/July 2015  . Cancer ([place])    skin cancer  . CHF (congestive heart failure) ([place])   . 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 Negative viral markers 2015  . Complication of anesthesia   . COPD (chronic obstructive pulmonary disease) ([place])   . Diabetes mellitus without complication ([place])   . GERD (gastroesophageal reflux disease)   . History of kidney stones   . Hypercholesteremia   . Hypertension   . Hypothyroidism   . IBS (irritable bowel syndrome)   . Kidney disease   . Liver disease   . Neuropathy   . PONV (postoperative nausea and vomiting)   . Sleep apnea   . Thyroid disease     Past Surgical History:  Procedure Laterality Date  . ABDOMINAL HYSTERECTOMY    . APPENDECTOMY    . CAROTID ARTERY - SUBCLAVIAN ARTERY BYPASS GRAFT Right   . CARPAL TUNNEL RELEASE Right   . CARPAL TUNNEL RELEASE Left 12/03/2016   Procedure: CARPAL TUNNEL RELEASE;  Surgeon: Carole Civil, MD;  Location: [facility];   Service: Orthopedics;  Laterality: Left;  . CATARACT EXTRACTION Bilateral   . CHOLECYSTECTOMY    . COLONOSCOPY N/A 02/19/2014   Dr. Gala Romney: rectal varices, rectal polyp overlying a varix non-manipulated  . ESOPHAGEAL BANDING N/A 08/15/2018   Procedure: ESOPHAGEAL BANDING;  Surgeon: Daneil Dolin, MD;  Location: [facility];  Service: Endoscopy;  Laterality: N/A;  . ESOPHAGEAL BANDING N/A 09/15/2018   Procedure: ESOPHAGEAL BANDING;  Surgeon: Daneil Dolin, MD;  Location: [facility];  Service: Endoscopy;  Laterality: N/A;  . ESOPHAGOGASTRODUODENOSCOPY N/A 02/19/2014   Dr. Gala Romney: 4 columns of grade 2 esophageal varices, multiple gastric polyps with largest biopsied and hyperplastic. Negative H.pylori  . ESOPHAGOGASTRODUODENOSCOPY N/A 04/13/2018   Dr. Gala Romney: Grade 3 esophageal varices, no esophagitis, portal gastropathy, multiple hyperplastic appearing polyps  . ESOPHAGOGASTRODUODENOSCOPY (EGD) WITH PROPOFOL N/A 08/15/2018   Procedure: ESOPHAGOGASTRODUODENOSCOPY (EGD) WITH PROPOFOL;  Surgeon: Daneil Dolin, MD;  [facility];  Service: Endoscopy;  Laterality: N/A;  1:45pm  . ESOPHAGOGASTRODUODENOSCOPY (EGD) WITH PROPOFOL N/A 09/15/2018   Dr. Gala Romney: Grade 2 esophageal varices, portal gastropathy, normal first and second duodenum, s/p banding X 5. Surveillance in 1 year  . GIVENS CAPSULE STUDY N/A 05/04/2014   multiple polypoid-like lesions throughout small bowel, scattered superficial non-bleeding erosions more distal bowel, referred to [facility] for enteroscopy. Capsule study not complete, poor images.   Marland Kitchen TRIGGER FINGER RELEASE Left 12/03/2016   Procedure: RELEASE TRIGGER FINGER/A-1 PULLEY LEFT LONG TRIGGER FINGER RELEASE;  Surgeon: Carole Civil, MD;  Location: [facility];  Service: Orthopedics;  Laterality: Left;  . urosepsis         HPI  from the history and physical done on the day of admission:    - Chief Complaint:  Abdominal distension and pain.  HPI: Penny Martinez is  a 68 y.o. female with medical history significant for hepatic cirrhosis, hypertension, diastolic CHF, diabetes mellitus 2.  Patient presented to the ED with complaints of progressive abdominal distention and difficulty breathing.  She also reports abdominal pain which was more of a tightness.  She was scheduled for paracentesis tomorrow but she could not wait hence she presented to the ED. She also reports leg swelling that has not significantly changed.  Reports a mild cough only when she is lying back due to her abdominal distention.  Denies chest pain.  No fevers or chills.  She reports dizziness at this time.  She reports abdominal pain and difficulty breathing have both significantly improved since the paracentesis in the ED.  She is compliant with Lasix and benazepril.  Denies vomiting or loose stools.  She reports she is getting a third course of antibiotics for urinary tract infection.  She is currently on the seventh day of a 7-day course of antibiotics she does not remember the name.   ED Course: Hypotensive with blood pressure systolic 43/32, O2 sats initially 98%, dropped to 86%.  Improved with O2 via nasal cannula.  Temperature 98.5.  Patient had urgent paracentesis in the ED with 6 L O2, fluid appeared cloudy hence cultures were drawn and patient was started on antibiotics.  Portable chest x-ray shows patchy left lung base consolidation, suspicious for pneumonia.  Patient was given IV aztreonam and metronidazole.  1 L bolus given.  COVID-19 test pending.    Hospital Course:    -Brief Summary 68 y.o.femalewith medical history significant forhepatic cirrhosis, hypertension, diastolic CHF, diabetes mellitus 2 admitted on 06/13/2019 with concerns about pneumonia and possible SBP in the setting of ascites, status post paracentesis on 06/13/2019   A/p 1)CAP--much improved, hypoxia resolved, no fevers no leukocytosis.   Treated with IV Rocephin and doxycycline -Discharge home on doxycycline  and Omnicef  2) liver cirrhosis with recurrent ascites--- clinically SBP unlikely,--ascitic fluid culture from 05/31/2019 without growth, ascitic fluid Gram stain and culture from 06/13/2019 NGTD No fevers or leukocytosis  3)AKI----acute kidney injury due to dehydration compounded by benazepril and torsemide use,    creatinine on admission= 1.65 ,   baseline creatinine =1.0 ( 05/31/19)    , creatinine is now=1.39  , renally adjust medications, avoid nephrotoxic agents / dehydration /hypotension -Overall improved renal function, Restart Benazepril at 5 mg daily down from 20 mg Restart Torsemide at 20 mg daily down from 40 mg  4)HFpEF--no flareup at this time, restart benazepril and torsemide at reduced doses as above  5)DM2-A1c 7.0 05/31/2019, decrease Humulin R insulin to 60 mg with breakfast down from 80 mg and decrease evening dose  to 45 mg down from 60 mg, continue Victoza    Disposition--home  Code Status : Full  Family Communication:   NA (patient is alert, awake and coherent)  Consults  :  na  Discharge Condition: stable  Follow UP  Follow-up Information    Vidal Schwalbe, MD Follow up.   Specialty: Family Medicine Why: hospital follow up appointment October 26 th at 10 am- this is a virtual visit Contact information: [address] [place] [address] [phone]           Diet and Activity recommendation:  As advised  Discharge Instructions    Discharge Instructions    Call MD for:  difficulty breathing, headache or visual disturbances   Complete by: As directed    Call MD for:  persistant dizziness or light-headedness   Complete by: As directed    Call MD for:  persistant nausea and vomiting   Complete by: As directed    Call MD for:  severe uncontrolled pain   Complete by: As directed    Call MD for:  temperature >100.4   Complete by: As directed    Diet - low sodium heart healthy   Complete by: As directed    Discharge instructions   Complete  by: As directed    1) follow-up to primary care physician and gastroenterologist as previously advised 2) you may need repeat Paracentesis (removal of Fluid from your belly) from time to time 3) please complete Omnicef and doxycycline antibiotics for pneumonia 4) your insulin doses have been reduced to avoid low blood sugar--   Increase activity slowly   Complete by: As directed       Discharge Medications     Allergies as of 06/15/2019      Reactions   Erythromycin Hives   Niacin Rash, Shortness Of Breath   Penicillins Shortness Of Breath, Other (See Comments)   Has patient had a PCN reaction causing immediate rash, facial/tongue/throat swelling, SOB or lightheadedness with hypotension: Yes Has patient had a PCN reaction causing severe rash involving mucus membranes or skin necrosis: No Has patient had a PCN reaction that required hospitalization: No Has patient had a PCN reaction occurring within the last 10 years: No If all of the above answers are "NO", then may proceed with Cephalosporin use.   Sulfa Antibiotics Hives   Ciprofloxacin Itching, Nausea And Vomiting   Stomach pain   Statins Other (See Comments)   Muscle Pain   Codeine Itching   Fluticasone Rash   Tramadol Rash      Medication List    STOP taking these medications   cephALEXin 500 MG capsule Commonly known as: KEFLEX   levofloxacin 500 MG tablet Commonly known as: LEVAQUIN   nitrofurantoin (macrocrystal-monohydrate) 100 MG capsule Commonly known as: MACROBID     TAKE these medications   amitriptyline 25 MG tablet Commonly known as: ELAVIL Take 25 mg by mouth at bedtime.   Aricept 5 MG tablet Generic drug: donepezil Take 5 mg by mouth at bedtime.   aspirin EC 81 MG tablet Take 1 tablet (81 mg total) by mouth daily with breakfast. What changed: when to take this   Azelastine HCl 137 MCG/SPRAY Soln Place 1 spray into both nostrils as needed (for congestion).   benazepril 5 MG tablet Commonly  known as: LOTENSIN Take 1 tablet (5 mg total) by mouth daily. What changed:   medication strength  how much to take   cefdinir 300 MG capsule Commonly  known as: OMNICEF Take 1 capsule (300 mg total) by mouth 2 (two) times daily for 3 days.   CENTRUM SILVER ADULT 50+ PO Take 1 tablet by mouth daily.   CRANBERRY PO Take 15,000 mg by mouth at bedtime.   diphenhydrAMINE 25 MG tablet Commonly known as: SOMINEX Take 50 mg by mouth at bedtime.   diphenoxylate-atropine 2.5-0.025 MG tablet Commonly known as: LOMOTIL 1-2 tablets every 4-6 hours as needed for diarrhea.   doxycycline 100 MG tablet Commonly known as: VIBRA-TABS Take 1 tablet (100 mg total) by mouth 2 (two) times daily for 3 days.   ezetimibe 10 MG tablet Commonly known as: ZETIA Take 10 mg by mouth every morning.   fluconazole 150 MG tablet Commonly known as: DIFLUCAN Take 1 tablet (150 mg total) by mouth once for 1 dose. Start taking on: June 17, 2019 What changed:   how much to take  how to take this  when to take this  These instructions start on June 17, 2019. If you are unsure what to do until then, ask your doctor or other care provider.   FreeStyle Libre 14 Day Sensor Misc 1 each by Does not apply route every 14 (fourteen) days.   gabapentin 300 MG capsule Commonly known as: NEURONTIN Take 300 mg by mouth at bedtime.   gabapentin 100 MG capsule Commonly known as: NEURONTIN Take 100 mg by mouth 3 (three) times daily.   HumuLIN R U-500 KwikPen 500 UNIT/ML kwikpen Generic drug: insulin regular human CONCENTRATED Inject 60 units with breakfast and supper 40 units with supper when premeal glucose is above 90 What changed: additional instructions   lansoprazole 30 MG capsule Commonly known as: PREVACID Take 1 capsule (30 mg total) by mouth 2 (two) times daily before a meal. What changed: when to take this   levothyroxine 100 MCG tablet Commonly known as: SYNTHROID TAKE 1 TABLET BY  MOUTH ONCE DAILY.   ondansetron 4 MG tablet Commonly known as: ZOFRAN Take 1 tablet (4 mg total) by mouth 4 (four) times daily -  before meals and at bedtime.   torsemide 20 MG tablet Commonly known as: DEMADEX Take 1 tablet (20 mg total) by mouth daily. Start taking on: June 16, 2019 What changed: how much to take   Victoza 18 MG/3ML Sopn Generic drug: liraglutide INJECT 1.8MG SUBCUTANEOUSLY ONCE DAILY.   Vitamin D (Ergocalciferol) 1.25 MG (50000 UT) Caps capsule Commonly known as: DRISDOL Take 50,000 Units by mouth every 30 (thirty) days.      Major procedures and Radiology Reports - PLEASE review detailed and final reports for all details, in brief -   US Abdomen Limited  Result Date: 05/18/2019 CLINICAL DATA:  Cirrhosis, ascites EXAM: LIMITED ABDOMEN ULTRASOUND FOR ASCITES TECHNIQUE: Limited ultrasound survey for ascites was performed in all four abdominal quadrants. COMPARISON:  05/01/2019 FINDINGS: Only small volume ascites is identified upon survey imaging of the abdomen. Paracentesis not performed. IMPRESSION: Paracentesis not performed. Electronically Signed   By: Lavonia Dana M.D.   On: 05/18/2019 12:06   US Paracentesis  Result Date: 05/31/2019 INDICATION: Recurrent ascites EXAM: ULTRASOUND GUIDED Left PARACENTESIS MEDICATIONS: 10 cc 1% lidocaine COMPLICATIONS: None immediate. PROCEDURE: Informed written consent was obtained from the patient after a discussion of the risks, benefits and alternatives to treatment. A timeout was performed prior to the initiation of the procedure. Initial ultrasound scanning demonstrates a large amount of ascites within the left lower abdominal quadrant. The left lower abdomen was prepped and draped in  the usual sterile fashion. 1% lidocaine was used for local anesthesia. Following this, a 19 gauge Yueh catheter was introduced. An ultrasound image was saved for documentation purposes. The paracentesis was performed. The catheter was removed and  a dressing was applied. The patient tolerated the procedure well without immediate post procedural complication. Patient did not receive post-procedure intravenous albumin per MD. FINDINGS: A total of approximately 3.6 of yellow fluid was removed. Samples were sent to the laboratory as requested by the clinical team. IMPRESSION: Successful ultrasound-guided paracentesis yielding 3.6 liters of peritoneal fluid. Read by Lavonia Drafts [facility] Electronically Signed   By: Lavonia Dana M.D.   On: 05/31/2019 13:43   Dg Chest Port 1 View  Result Date: 06/13/2019 CLINICAL DATA:  Cough and dyspnea EXAM: PORTABLE CHEST 1 VIEW COMPARISON:  05/23/2018 chest radiograph. FINDINGS: Stable cardiomediastinal silhouette with top-normal heart size. No pneumothorax. Small left pleural effusion. No right pleural effusion. Patchy left lung base consolidation. No pulmonary edema. IMPRESSION: 1. Patchy left lung base consolidation suspicious for pneumonia. Recommend follow-up PA and lateral post treatment chest radiographs in 4-6 weeks. 2. Small left pleural effusion. Electronically Signed   By: Ilona Sorrel M.D.   On: 06/13/2019 20:45   Micro Results   Recent Results (from the past 240 hour(s))  Gram stain     Status: None   Collection Time: 06/13/19  6:09 PM   Specimen: Peritoneal Cavity; Body Fluid  Result Value Ref Range Status   Specimen Description PERITONEAL CAVITY  Final   Special Requests NONE  Final   Gram Stain   Final    CYTOSPIN SMEAR WBC PRESENT, PREDOMINANTLY MONONUCLEAR NO ORGANISMS SEEN Performed at [facility], [address] [place], [place] [address]    Report Status 06/13/2019 FINAL  Final  Blood culture (routine x 2)     Status: None (Preliminary result)   Collection Time: 06/13/19  6:30 PM   Specimen: BLOOD LEFT HAND  Result Value Ref Range Status   Specimen Description BLOOD LEFT HAND  Final   Special Requests   Final    BOTTLES DRAWN AEROBIC AND ANAEROBIC Blood Culture adequate volume    Culture   Final    NO GROWTH 2 DAYS Performed at [facility], [address], [place], [place] [address]    Report Status PENDING  Incomplete  Blood culture (routine x 2)     Status: None (Preliminary result)   Collection Time: 06/13/19  6:49 PM   Specimen: BLOOD RIGHT HAND  Result Value Ref Range Status   Specimen Description BLOOD RIGHT HAND  Final   Special Requests   Final    BOTTLES DRAWN AEROBIC ONLY Blood Culture adequate volume   Culture   Final    NO GROWTH 2 DAYS Performed at [facility], [address] [place], [place] [address]    Report Status PENDING  Incomplete  Culture, body fluid-bottle     Status: None (Preliminary result)   Collection Time: 06/13/19  7:35 PM   Specimen: Peritoneal Washings  Result Value Ref Range Status   Specimen Description PERITONEAL  Final   Special Requests BOTTLES DRAWN AEROBIC AND ANAEROBIC  Final   Culture   Final    NO GROWTH 2 DAYS Performed at [facility], [address] [place], [place] [address]    Report Status PENDING  Incomplete  SARS Coronavirus 2 [facility] order, Performed in [place] hospital lab) Nasopharyngeal Nasopharyngeal Swab     Status: None   Collection Time:  06/13/19  8:00 PM   Specimen: Nasopharyngeal Swab  Result Value Ref Range Status   SARS Coronavirus 2 NEGATIVE NEGATIVE Final    Comment: (NOTE) If result is NEGATIVE SARS-CoV-2 target nucleic acids are NOT DETECTED. The SARS-CoV-2 RNA is generally detectable in upper and lower  respiratory specimens during the acute phase of infection. The lowest  concentration of SARS-CoV-2 viral copies this assay can detect is 250  copies / mL. A negative result does not preclude SARS-CoV-2 infection  and should not be used as the sole basis for treatment or other  patient management decisions.  A negative result may occur with  improper specimen collection / handling, submission of specimen other  than nasopharyngeal swab, presence of viral mutation(s) within the   areas targeted by this assay, and inadequate number of viral copies  (<250 copies / mL). A negative result must be combined with clinical  observations, patient history, and epidemiological information. If result is POSITIVE SARS-CoV-2 target nucleic acids are DETECTED. The SARS-CoV-2 RNA is generally detectable in upper and lower  respiratory specimens dur ing the acute phase of infection.  Positive  results are indicative of active infection with SARS-CoV-2.  Clinical  correlation with patient history and other diagnostic information is  necessary to determine patient infection status.  Positive results do  not rule out bacterial infection or co-infection with other viruses. If result is PRESUMPTIVE POSTIVE SARS-CoV-2 nucleic acids MAY BE PRESENT.   A presumptive positive result was obtained on the submitted specimen  and confirmed on repeat testing.  While 2019 novel coronavirus  (SARS-CoV-2) nucleic acids may be present in the submitted sample  additional confirmatory testing may be necessary for epidemiological  and / or clinical management purposes  to differentiate between  SARS-CoV-2 and other Sarbecovirus currently known to infect humans.  If clinically indicated additional testing with an alternate test  methodology [phone]) is advised. The SARS-CoV-2 RNA is generally  detectable in upper and lower respiratory sp ecimens during the acute  phase of infection. The expected result is Negative. Fact Sheet for Patients:  StrictlyIdeas.no Fact Sheet for Healthcare Providers: BankingDealers.co.za This test is not yet approved or cleared by the [place] FDA and has been authorized for detection and/or diagnosis of SARS-CoV-2 by FDA under an Emergency Use Authorization (EUA).  This EUA will remain in effect (meaning this test can be used) for the duration of the COVID-19 declaration under Section 564(b)(1) of the Act, 21 U.S.C.  section 360bbb-3(b)(1), unless the authorization is terminated or revoked sooner. Performed at [facility], [address] [place], [place] [address]   MRSA PCR Screening     Status: None   Collection Time: 06/14/19 12:24 PM   Specimen: Nasal Mucosa; Nasopharyngeal  Result Value Ref Range Status   MRSA by PCR NEGATIVE NEGATIVE Final    Comment:        The GeneXpert MRSA Assay (FDA approved for NASAL specimens only), is one component of a comprehensive MRSA colonization surveillance program. It is not intended to diagnose MRSA infection nor to guide or monitor treatment for MRSA infections. Performed at [facility], [address], [place], [place] [address]    Today   Subjective    Penny Martinez today has no new complaints, eating and drinking well, ambulating around the room, without significant dyspnea -No fever no chills, no vomiting no increased abdominal pain,          Patient has been seen and examined prior to discharge  Objective   Blood pressure (!) 113/54, pulse 91, temperature 98.3 F (36.8 C), temperature source Oral, resp. rate 15, height 5' 1"  (1.549 m), weight 106.1 kg, SpO2 94 %.   Intake/Output Summary (Last 24 hours) at 06/15/2019 1648 Last data filed at 06/15/2019 1500 Gross per 24 hour  Intake 762.5 ml  Output 200 ml  Net 562.5 ml    Exam Gen:- Awake Alert, no acute distress  HEENT:- [place].AT, No sclera icterus Neck-Supple Neck,No JVD,.  Lungs-  CTAB , good air movement bilaterally  CV- S1, S2 normal, regular Abd-  +ve B.Sounds, Abd Soft, No tenderness,    Extremity/Skin:- trace  edema,   good pulses Psych-affect is appropriate, oriented x3 Neuro-no new focal deficits, no tremors    Data Review   CBC w Diff:  Lab Results  Component Value Date   WBC 5.7 06/14/2019   HGB 10.6 (L) 06/14/2019   HCT 36.8 06/14/2019   PLT 131 (L) 06/14/2019   LYMPHOPCT 17 06/13/2019   MONOPCT 9 06/13/2019   EOSPCT 1 06/13/2019   BASOPCT 0 06/13/2019     CMP:  Lab Results  Component Value Date   NA 132 (L) 06/15/2019   K 4.4 06/15/2019   CL 100 06/15/2019   CO2 21 (L) 06/15/2019   BUN 28 (H) 06/15/2019   CREATININE 1.39 (H) 06/15/2019   CREATININE 1.06 (H) 05/31/2019   PROT 6.6 06/13/2019   ALBUMIN 2.7 (L) 06/13/2019   BILITOT 0.6 06/13/2019   ALKPHOS 110 06/13/2019   AST 42 (H) 06/13/2019   ALT 19 06/13/2019  .   Total Discharge time is about 33 minutes  Roxan Hockey M.D on 06/15/2019 at 4:48 PM  Go to [email or web address] -  for contact info  [facility] - Office  [phone]

## 2019-06-15 NOTE — Progress Notes (Signed)
Patient was complaining of itching all over, said it started after doxycycline started going in her IV. Patient has had multiple complaints of her IV hurting her and she just wants it out. Patient states she is irritated at the situation. Dr. Denton Brick notified, received order for IV Benadryl for itching. IV is not red, flushes well and gives blood return. Will monitor IV site. IV was saline locked once doxycycline IVPB was finished and IV benadryl was given.

## 2019-06-15 NOTE — Discharge Instructions (Signed)
1) follow-up to primary care physician and gastroenterologist as previously advised 2) you may need repeat Paracentesis (removal of Fluid from your belly) from time to time (probably every 2 weeks) 3) please complete Omnicef and doxycycline antibiotics for pneumonia 4) your insulin doses have been reduced to avoid low blood sugar--

## 2019-06-15 NOTE — TOC Initial Note (Addendum)
Transition of Care [facility]) - Initial/Assessment Note    Patient Details  Name: Penny Martinez MRN: [id number] Date of Birth: Aug 15, 1951  Transition of Care [facility]) CM/SW Contact:    Faiza Bansal, Chrystine Oiler, RN Phone Number: 06/15/2019, 3:25 PM  Clinical Narrative:  High readmission score due to number or active RX's.  CAP. Medical history of hepatic cirrhosis, hypertension, diastolic CHF.   Patient from home, independent.               Has cane, uses prn. Has PCP, no issues with transportation or obtaining medications. Does not feel she needs home health at this time, aware PCP can order. Gets paracentensis every 2 weeks for liver cirrhosis.   Follow up appt scheduled.   Expected Discharge Plan: Home/Self Care Barriers to Discharge: No Barriers Identified   Patient Goals and CMS Choice Patient states their goals for this hospitalization and ongoing recovery are:: have a procedure so she can breathe better CMS Medicare.gov Compare Post Acute Care list provided to:: Patient Choice offered to / list presented to : Patient  Expected Discharge Plan and Services Expected Discharge Plan: Home/Self Care   Discharge Planning Services: CM Consult   Living arrangements for the past 2 months: Single Family Home                                      Prior Living Arrangements/Services Living arrangements for the past 2 months: Single Family Home Lives with:: Spouse Patient language and need for interpreter reviewed:: Yes        Need for Family Participation in Patient Care: Yes (Comment) Care giver support system in place?: Yes (comment) Current home services: DME(cane) Criminal Activity/Legal Involvement Pertinent to Current Situation/Hospitalization: No - Comment as needed  Activities of Daily Living Home Assistive Devices/Equipment: CBG Meter, Eyeglasses, Blood pressure cuff, Cane (specify quad or straight) ADL Screening (condition at time of admission) Patient's  cognitive ability adequate to safely complete daily activities?: Yes Is the patient deaf or have difficulty hearing?: No Does the patient have difficulty seeing, even when wearing glasses/contacts?: No Does the patient have difficulty concentrating, remembering, or making decisions?: No Patient able to express need for assistance with ADLs?: Yes Does the patient have difficulty dressing or bathing?: No Independently performs ADLs?: Yes (appropriate for developmental age) Does the patient have difficulty walking or climbing stairs?: No Weakness of Legs: None Weakness of Arms/Hands: None  Permission Sought/Granted                  Emotional Assessment   Attitude/Demeanor/Rapport: Engaged Affect (typically observed): Accepting Orientation: : Oriented to Self, Oriented to Place, Oriented to  Time Alcohol / Substance Use: Not Applicable Psych Involvement: No (comment)  Admission diagnosis:  Abdominal Pain; Shortness of Breath Patient Active Problem List   Diagnosis Date Noted  . CAP (community acquired pneumonia) 06/13/2019  . GERD (gastroesophageal reflux disease) 10/25/2018  . LUQ fullness 07/25/2018  . CKD stage 3 due to type 2 diabetes mellitus (HCC) 05/24/2018  . Hyponatremia syndrome 05/24/2018  . Other dysphagia 09/15/2017  . Uncontrolled type 2 diabetes mellitus with complication (HCC) 04/27/2017  . Mixed hyperlipidemia 04/27/2017  . Hyponatremia 04/27/2017  . Chronic diarrhea 12/15/2016  . Carpal tunnel syndrome, left   . Trigger middle finger of left hand   . Thrombocytopenia (HCC) 11/04/2016  . Iron deficiency anemia due to chronic blood loss 01/31/2016  . Other  specified hypothyroidism 09/20/2015  . Essential hypertension, benign 07/12/2015  . Morbid obesity with BMI of 40.0-44.9, adult (HCC) 07/12/2015  . Hepatic cirrhosis (HCC) 04/02/2014  . Acute colitis 03/05/2014  . Abdominal pain, acute, left lower quadrant 03/05/2014  . Colitis 03/05/2014  .  Splenomegaly, congestive, chronic 02/17/2014  . Sepsis (HCC) 02/15/2014  . Absolute anemia 02/15/2014  . Diabetes mellitus with stage 3 chronic kidney disease (HCC) 02/15/2014  . CHF (congestive heart failure) (HCC) 02/15/2014  . CKD (chronic kidney disease) 02/15/2014  . UTI (lower urinary tract infection) 02/15/2014   PCP:  [name], MD Pharmacy:   [facility] - [place], [place] - [address] [place] [address] Phone: [phone] Fax: [phone]  [facility], [place]. - [place], [place] - [address] [place] [address] Phone: [phone] Fax: [phone]     Social Determinants of Health (SDOH) Interventions    Readmission Risk Interventions Readmission Risk Prevention Plan 06/15/2019 06/14/2019  Transportation Screening - Complete  PCP or Specialist Appt within 3-5 Days Complete -  HRI or Home Care Consult Complete Complete  Social Work Consult for Recovery Care Planning/Counseling - Complete  Palliative Care Screening - Not Applicable  Medication Review [employer]) - Complete  Some recent data might be hidden

## 2019-06-19 LAB — CULTURE, BODY FLUID W GRAM STAIN -BOTTLE: Culture: NO GROWTH

## 2019-06-19 LAB — CULTURE, BLOOD (ROUTINE X 2)
Culture: NO GROWTH
Culture: NO GROWTH
Special Requests: ADEQUATE
Special Requests: ADEQUATE

## 2019-06-21 ENCOUNTER — Other Ambulatory Visit: Payer: Self-pay

## 2019-06-21 ENCOUNTER — Encounter (HOSPITAL_COMMUNITY): Payer: Self-pay

## 2019-06-21 ENCOUNTER — Ambulatory Visit (HOSPITAL_COMMUNITY)
Admission: RE | Admit: 2019-06-21 | Discharge: 2019-06-21 | Disposition: A | Discharge status: Home / Self Care | Payer: Medicare Other | Source: Ambulatory Visit | Attending: Gastroenterology | Admitting: Gastroenterology

## 2019-06-21 DIAGNOSIS — R188 Other ascites: Secondary | ICD-10-CM

## 2019-06-21 NOTE — Progress Notes (Signed)
Paracentesis complete no signs of distress.

## 2019-06-27 ENCOUNTER — Other Ambulatory Visit: Payer: Self-pay

## 2019-06-27 ENCOUNTER — Inpatient Hospital Stay (HOSPITAL_COMMUNITY)
Admission: EM | Admit: 2019-06-27 | Discharge: 2019-07-04 | DRG: 441 | Disposition: A | Discharge status: Skilled Nursing Facility | Payer: Medicare Other | Attending: Family Medicine | Admitting: Family Medicine

## 2019-06-27 ENCOUNTER — Encounter (HOSPITAL_COMMUNITY): Payer: Self-pay | Admitting: *Deleted

## 2019-06-27 DIAGNOSIS — Z20828 Contact with and (suspected) exposure to other viral communicable diseases: Secondary | ICD-10-CM | POA: Diagnosis present

## 2019-06-27 DIAGNOSIS — K7581 Nonalcoholic steatohepatitis (NASH): Secondary | ICD-10-CM | POA: Diagnosis present

## 2019-06-27 DIAGNOSIS — E038 Other specified hypothyroidism: Secondary | ICD-10-CM | POA: Diagnosis present

## 2019-06-27 DIAGNOSIS — Z6841 Body Mass Index (BMI) 40.0 and over, adult: Secondary | ICD-10-CM

## 2019-06-27 DIAGNOSIS — E1140 Type 2 diabetes mellitus with diabetic neuropathy, unspecified: Secondary | ICD-10-CM | POA: Diagnosis present

## 2019-06-27 DIAGNOSIS — E7220 Disorder of urea cycle metabolism, unspecified: Secondary | ICD-10-CM

## 2019-06-27 DIAGNOSIS — K7682 Hepatic encephalopathy: Secondary | ICD-10-CM

## 2019-06-27 DIAGNOSIS — N189 Chronic kidney disease, unspecified: Secondary | ICD-10-CM

## 2019-06-27 DIAGNOSIS — I130 Hypertensive heart and chronic kidney disease with heart failure and stage 1 through stage 4 chronic kidney disease, or unspecified chronic kidney disease: Secondary | ICD-10-CM | POA: Diagnosis present

## 2019-06-27 DIAGNOSIS — D509 Iron deficiency anemia, unspecified: Secondary | ICD-10-CM | POA: Diagnosis present

## 2019-06-27 DIAGNOSIS — E875 Hyperkalemia: Secondary | ICD-10-CM | POA: Diagnosis present

## 2019-06-27 DIAGNOSIS — I8510 Secondary esophageal varices without bleeding: Secondary | ICD-10-CM | POA: Diagnosis present

## 2019-06-27 DIAGNOSIS — Z79899 Other long term (current) drug therapy: Secondary | ICD-10-CM

## 2019-06-27 DIAGNOSIS — E782 Mixed hyperlipidemia: Secondary | ICD-10-CM | POA: Diagnosis present

## 2019-06-27 DIAGNOSIS — N183 Chronic kidney disease, stage 3 unspecified: Secondary | ICD-10-CM | POA: Diagnosis present

## 2019-06-27 DIAGNOSIS — Z9119 Patient's noncompliance with other medical treatment and regimen: Secondary | ICD-10-CM

## 2019-06-27 DIAGNOSIS — Z885 Allergy status to narcotic agent status: Secondary | ICD-10-CM

## 2019-06-27 DIAGNOSIS — N179 Acute kidney failure, unspecified: Secondary | ICD-10-CM | POA: Diagnosis present

## 2019-06-27 DIAGNOSIS — E876 Hypokalemia: Secondary | ICD-10-CM | POA: Diagnosis present

## 2019-06-27 DIAGNOSIS — Z87891 Personal history of nicotine dependence: Secondary | ICD-10-CM

## 2019-06-27 DIAGNOSIS — K589 Irritable bowel syndrome without diarrhea: Secondary | ICD-10-CM | POA: Diagnosis present

## 2019-06-27 DIAGNOSIS — I5032 Chronic diastolic (congestive) heart failure: Secondary | ICD-10-CM | POA: Diagnosis present

## 2019-06-27 DIAGNOSIS — E1165 Type 2 diabetes mellitus with hyperglycemia: Secondary | ICD-10-CM | POA: Diagnosis not present

## 2019-06-27 DIAGNOSIS — R188 Other ascites: Secondary | ICD-10-CM | POA: Diagnosis present

## 2019-06-27 DIAGNOSIS — E1122 Type 2 diabetes mellitus with diabetic chronic kidney disease: Secondary | ICD-10-CM | POA: Diagnosis present

## 2019-06-27 DIAGNOSIS — Z90710 Acquired absence of both cervix and uterus: Secondary | ICD-10-CM

## 2019-06-27 DIAGNOSIS — Z8249 Family history of ischemic heart disease and other diseases of the circulatory system: Secondary | ICD-10-CM

## 2019-06-27 DIAGNOSIS — I10 Essential (primary) hypertension: Secondary | ICD-10-CM | POA: Diagnosis not present

## 2019-06-27 DIAGNOSIS — K7460 Unspecified cirrhosis of liver: Secondary | ICD-10-CM | POA: Diagnosis not present

## 2019-06-27 DIAGNOSIS — K767 Hepatorenal syndrome: Secondary | ICD-10-CM | POA: Diagnosis present

## 2019-06-27 DIAGNOSIS — Z87442 Personal history of urinary calculi: Secondary | ICD-10-CM

## 2019-06-27 DIAGNOSIS — Z880 Allergy status to penicillin: Secondary | ICD-10-CM

## 2019-06-27 DIAGNOSIS — Z79890 Hormone replacement therapy (postmenopausal): Secondary | ICD-10-CM

## 2019-06-27 DIAGNOSIS — Z882 Allergy status to sulfonamides status: Secondary | ICD-10-CM

## 2019-06-27 DIAGNOSIS — K7200 Acute and subacute hepatic failure without coma: Secondary | ICD-10-CM | POA: Diagnosis present

## 2019-06-27 DIAGNOSIS — G4730 Sleep apnea, unspecified: Secondary | ICD-10-CM | POA: Diagnosis present

## 2019-06-27 DIAGNOSIS — F0390 Unspecified dementia without behavioral disturbance: Secondary | ICD-10-CM | POA: Diagnosis present

## 2019-06-27 DIAGNOSIS — K7290 Hepatic failure, unspecified without coma: Secondary | ICD-10-CM

## 2019-06-27 DIAGNOSIS — Z841 Family history of disorders of kidney and ureter: Secondary | ICD-10-CM

## 2019-06-27 DIAGNOSIS — N1832 Chronic kidney disease, stage 3b: Secondary | ICD-10-CM | POA: Diagnosis not present

## 2019-06-27 DIAGNOSIS — K219 Gastro-esophageal reflux disease without esophagitis: Secondary | ICD-10-CM | POA: Diagnosis present

## 2019-06-27 DIAGNOSIS — R251 Tremor, unspecified: Secondary | ICD-10-CM | POA: Diagnosis present

## 2019-06-27 DIAGNOSIS — Z821 Family history of blindness and visual loss: Secondary | ICD-10-CM

## 2019-06-27 DIAGNOSIS — Z7982 Long term (current) use of aspirin: Secondary | ICD-10-CM

## 2019-06-27 DIAGNOSIS — Z881 Allergy status to other antibiotic agents status: Secondary | ICD-10-CM

## 2019-06-27 DIAGNOSIS — Z7951 Long term (current) use of inhaled steroids: Secondary | ICD-10-CM

## 2019-06-27 DIAGNOSIS — J449 Chronic obstructive pulmonary disease, unspecified: Secondary | ICD-10-CM | POA: Diagnosis present

## 2019-06-27 DIAGNOSIS — G4733 Obstructive sleep apnea (adult) (pediatric): Secondary | ICD-10-CM | POA: Diagnosis present

## 2019-06-27 DIAGNOSIS — Z9049 Acquired absence of other specified parts of digestive tract: Secondary | ICD-10-CM

## 2019-06-27 DIAGNOSIS — F329 Major depressive disorder, single episode, unspecified: Secondary | ICD-10-CM | POA: Diagnosis present

## 2019-06-27 DIAGNOSIS — Z888 Allergy status to other drugs, medicaments and biological substances status: Secondary | ICD-10-CM

## 2019-06-27 DIAGNOSIS — Z833 Family history of diabetes mellitus: Secondary | ICD-10-CM

## 2019-06-27 DIAGNOSIS — Z85828 Personal history of other malignant neoplasm of skin: Secondary | ICD-10-CM

## 2019-06-27 LAB — CBC WITH DIFFERENTIAL/PLATELET
Abs Immature Granulocytes: 0.04 10*3/uL (ref 0.00–0.07)
Basophils Absolute: 0 10*3/uL (ref 0.0–0.1)
Basophils Relative: 1 %
Eosinophils Absolute: 0.1 10*3/uL (ref 0.0–0.5)
Eosinophils Relative: 1 %
HCT: 40.7 % (ref 36.0–46.0)
Hemoglobin: 12.1 g/dL (ref 12.0–15.0)
Immature Granulocytes: 1 %
Lymphocytes Relative: 10 %
Lymphs Abs: 0.9 10*3/uL (ref 0.7–4.0)
MCH: 25.6 pg — ABNORMAL LOW (ref 26.0–34.0)
MCHC: 29.7 g/dL — ABNORMAL LOW (ref 30.0–36.0)
MCV: 86 fL (ref 80.0–100.0)
Monocytes Absolute: 0.8 10*3/uL (ref 0.1–1.0)
Monocytes Relative: 9 %
Neutro Abs: 6.9 10*3/uL (ref 1.7–7.7)
Neutrophils Relative %: 78 %
Platelets: 183 10*3/uL (ref 150–400)
RBC: 4.73 MIL/uL (ref 3.87–5.11)
RDW: 23.9 % — ABNORMAL HIGH (ref 11.5–15.5)
WBC: 8.7 10*3/uL (ref 4.0–10.5)
nRBC: 0 % (ref 0.0–0.2)

## 2019-06-27 LAB — URINALYSIS, ROUTINE W REFLEX MICROSCOPIC
Bilirubin Urine: NEGATIVE
Glucose, UA: NEGATIVE mg/dL
Hgb urine dipstick: NEGATIVE
Ketones, ur: NEGATIVE mg/dL
Nitrite: NEGATIVE
Protein, ur: NEGATIVE mg/dL
Specific Gravity, Urine: 1.012 (ref 1.005–1.030)
pH: 5 (ref 5.0–8.0)

## 2019-06-27 LAB — COMPREHENSIVE METABOLIC PANEL
ALT: 22 U/L (ref 0–44)
AST: 39 U/L (ref 15–41)
Albumin: 2.6 g/dL — ABNORMAL LOW (ref 3.5–5.0)
Alkaline Phosphatase: 109 U/L (ref 38–126)
Anion gap: 12 (ref 5–15)
BUN: 89 mg/dL — ABNORMAL HIGH (ref 8–23)
CO2: 21 mmol/L — ABNORMAL LOW (ref 22–32)
Calcium: 8.3 mg/dL — ABNORMAL LOW (ref 8.9–10.3)
Chloride: 96 mmol/L — ABNORMAL LOW (ref 98–111)
Creatinine, Ser: 3.86 mg/dL — ABNORMAL HIGH (ref 0.44–1.00)
GFR calc Af Amer: 13 mL/min — ABNORMAL LOW (ref >60)
GFR calc non Af Amer: 11 mL/min — ABNORMAL LOW (ref >60)
Glucose, Bld: 150 mg/dL — ABNORMAL HIGH (ref 70–99)
Potassium: 6 mmol/L — ABNORMAL HIGH (ref 3.5–5.1)
Sodium: 129 mmol/L — ABNORMAL LOW (ref 135–145)
Total Bilirubin: 0.7 mg/dL (ref 0.3–1.2)
Total Protein: 6.1 g/dL — ABNORMAL LOW (ref 6.5–8.1)

## 2019-06-27 LAB — CBG MONITORING, ED
Glucose-Capillary: 102 mg/dL — ABNORMAL HIGH (ref 70–99)
Glucose-Capillary: 105 mg/dL — ABNORMAL HIGH (ref 70–99)

## 2019-06-27 LAB — PHOSPHORUS: Phosphorus: 6.8 mg/dL — ABNORMAL HIGH (ref 2.5–4.6)

## 2019-06-27 LAB — MAGNESIUM
Magnesium: 2.8 mg/dL — ABNORMAL HIGH (ref 1.7–2.4)
Magnesium: 2.9 mg/dL — ABNORMAL HIGH (ref 1.7–2.4)

## 2019-06-27 LAB — AMMONIA: Ammonia: 105 umol/L — ABNORMAL HIGH (ref 9–35)

## 2019-06-27 LAB — TSH: TSH: 2.993 u[IU]/mL (ref 0.350–4.500)

## 2019-06-27 MED ORDER — GABAPENTIN 100 MG PO CAPS
100.0000 mg | ORAL_CAPSULE | Freq: Three times a day (TID) | ORAL | Status: DC
  Administered 2019-06-28 – 2019-07-04 (×21): 100 mg via ORAL
  Filled 2019-06-27 (×21): qty 1

## 2019-06-27 MED ORDER — ONDANSETRON HCL 4 MG PO TABS
4.0000 mg | ORAL_TABLET | Freq: Four times a day (QID) | ORAL | Status: DC | PRN
  Administered 2019-07-02 – 2019-07-04 (×2): 4 mg via ORAL
  Filled 2019-06-27 (×2): qty 1

## 2019-06-27 MED ORDER — SODIUM BICARBONATE 4.2 % IV SOLN
25.0000 meq | Freq: Once | INTRAVENOUS | Status: DC
  Filled 2019-06-27: qty 50

## 2019-06-27 MED ORDER — ONDANSETRON HCL 4 MG/2ML IJ SOLN
4.0000 mg | Freq: Four times a day (QID) | INTRAMUSCULAR | Status: DC | PRN
  Administered 2019-06-30 – 2019-07-02 (×2): 4 mg via INTRAVENOUS
  Filled 2019-06-27 (×2): qty 2

## 2019-06-27 MED ORDER — PANTOPRAZOLE SODIUM 40 MG PO TBEC
40.0000 mg | DELAYED_RELEASE_TABLET | Freq: Every day | ORAL | Status: DC
  Administered 2019-06-28 – 2019-07-04 (×7): 40 mg via ORAL
  Filled 2019-06-27 (×7): qty 1

## 2019-06-27 MED ORDER — LACTULOSE 10 GM/15ML PO SOLN
30.0000 g | ORAL | Status: AC
  Administered 2019-06-27: 30 g via ORAL
  Filled 2019-06-27: qty 60

## 2019-06-27 MED ORDER — LACTULOSE 10 GM/15ML PO SOLN
30.0000 g | Freq: Two times a day (BID) | ORAL | Status: DC
  Administered 2019-06-28 – 2019-07-01 (×8): 30 g via ORAL
  Administered 2019-07-02: 20 g via ORAL
  Administered 2019-07-02 – 2019-07-04 (×4): 30 g via ORAL
  Filled 2019-06-27 (×14): qty 60

## 2019-06-27 MED ORDER — DONEPEZIL HCL 5 MG PO TABS
5.0000 mg | ORAL_TABLET | Freq: Every day | ORAL | Status: DC
  Administered 2019-06-28 – 2019-07-03 (×6): 5 mg via ORAL
  Filled 2019-06-27 (×6): qty 1

## 2019-06-27 MED ORDER — MAGNESIUM SULFATE 2 GM/50ML IV SOLN
2.0000 g | INTRAVENOUS | Status: DC
  Filled 2019-06-27: qty 50

## 2019-06-27 MED ORDER — EZETIMIBE 10 MG PO TABS
10.0000 mg | ORAL_TABLET | Freq: Every morning | ORAL | Status: DC
  Administered 2019-06-28 – 2019-07-04 (×7): 10 mg via ORAL
  Filled 2019-06-27 (×7): qty 1

## 2019-06-27 MED ORDER — ASPIRIN EC 81 MG PO TBEC
81.0000 mg | DELAYED_RELEASE_TABLET | Freq: Every day | ORAL | Status: DC
  Administered 2019-06-28 – 2019-07-04 (×7): 81 mg via ORAL
  Filled 2019-06-27 (×7): qty 1

## 2019-06-27 MED ORDER — LEVOTHYROXINE SODIUM 100 MCG PO TABS
100.0000 ug | ORAL_TABLET | Freq: Every day | ORAL | Status: DC
  Administered 2019-06-28 – 2019-07-04 (×7): 100 ug via ORAL
  Filled 2019-06-27 (×7): qty 1

## 2019-06-27 MED ORDER — POTASSIUM CHLORIDE 10 MEQ/100ML IV SOLN: 10.0000 meq | Freq: Once | INTRAVENOUS | Status: DC

## 2019-06-27 MED ORDER — INSULIN ASPART 100 UNIT/ML ~~LOC~~ SOLN
0.0000 [IU] | Freq: Three times a day (TID) | SUBCUTANEOUS | Status: DC
  Administered 2019-06-28 (×2): 3 [IU] via SUBCUTANEOUS
  Administered 2019-06-28 – 2019-06-29 (×2): 5 [IU] via SUBCUTANEOUS
  Administered 2019-06-29 – 2019-06-30 (×3): 8 [IU] via SUBCUTANEOUS
  Administered 2019-06-30: 3 [IU] via SUBCUTANEOUS
  Administered 2019-06-30: 11 [IU] via SUBCUTANEOUS
  Administered 2019-07-01 – 2019-07-02 (×5): 5 [IU] via SUBCUTANEOUS
  Administered 2019-07-02 – 2019-07-04 (×5): 3 [IU] via SUBCUTANEOUS
  Administered 2019-07-04: 2 [IU] via SUBCUTANEOUS

## 2019-06-27 MED ORDER — AMITRIPTYLINE HCL 25 MG PO TABS
25.0000 mg | ORAL_TABLET | Freq: Every day | ORAL | Status: DC
  Administered 2019-06-28 – 2019-07-03 (×6): 25 mg via ORAL
  Filled 2019-06-27 (×6): qty 1

## 2019-06-27 MED ORDER — SODIUM CHLORIDE 0.9 % IV SOLN
Freq: Once | INTRAVENOUS | Status: AC
  Administered 2019-06-27: 15:00:00 via INTRAVENOUS

## 2019-06-27 MED ORDER — AZELASTINE HCL 0.1 % NA SOLN
1.0000 | NASAL | Status: DC | PRN
  Filled 2019-06-27: qty 30

## 2019-06-27 MED ORDER — INSULIN GLARGINE 100 UNIT/ML ~~LOC~~ SOLN
20.0000 [IU] | Freq: Two times a day (BID) | SUBCUTANEOUS | Status: DC
  Administered 2019-06-28 – 2019-06-30 (×6): 20 [IU] via SUBCUTANEOUS
  Filled 2019-06-27 (×13): qty 0.2

## 2019-06-27 MED ORDER — FERROUS SULFATE 325 (65 FE) MG PO TABS
325.0000 mg | ORAL_TABLET | Freq: Every day | ORAL | Status: DC
  Administered 2019-06-28 – 2019-07-04 (×7): 325 mg via ORAL
  Filled 2019-06-27 (×7): qty 1

## 2019-06-27 MED ORDER — CALCIUM GLUCONATE-NACL 1-0.675 GM/50ML-% IV SOLN
1.0000 g | Freq: Once | INTRAVENOUS | Status: AC
  Administered 2019-06-27: 1000 mg via INTRAVENOUS
  Filled 2019-06-27: qty 50

## 2019-06-27 MED ORDER — SODIUM BICARBONATE 8.4 % IV SOLN
25.0000 meq | Freq: Once | INTRAVENOUS | Status: AC
  Administered 2019-06-27: 25 meq via INTRAVENOUS

## 2019-06-27 MED ORDER — SODIUM BICARBONATE 8.4 % IV SOLN
INTRAVENOUS | Status: AC
  Filled 2019-06-27: qty 50

## 2019-06-27 NOTE — ED Provider Notes (Signed)
[facility] EMERGENCY DEPARTMENT Provider Note   CSN: [id number] Arrival date & time: 06/27/19  1224     History   Chief Complaint Chief Complaint  Patient presents with   Tremors    HPI Penny Martinez is a 68 y.o. female.     HPI  68 y/o female - in with known liver failure, she has a history of cirrhosis, she is a diabetic, she has iron deficiency anemia.  The patient had been seen by myself approximately 2 weeks ago when she presented with the need for paracentesis secondary to abdominal distention and abdominal pain.  She ultimately was admitted to the hospital and stayed approximately 2 days.  She states that ever since being discharged from the hospital she has had a mild tremor of her arms and legs, she states it is not happening right now but does happen more frequently and is concerned about it.  She denies shortness of breath, cough, fever, chills, nausea, vomiting, diarrhea and states that other than her chronic abdominal distention she has no other concerns.  She is scheduled to have repeat paracentesis in about 1 week.  Past Medical History:  Diagnosis Date   Acid reflux    Anemia    Arthritis    C. 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 Negative viral markers 9509   Complication of anesthesia    COPD (chronic obstructive pulmonary disease) (HCC)    Diabetes mellitus without complication (HCC)    GERD (gastroesophageal reflux disease)    History of kidney stones    Hypercholesteremia    Hypertension    Hypothyroidism    IBS (irritable bowel syndrome)    Kidney disease    Liver disease    Neuropathy    PONV (postoperative nausea and vomiting)    Sleep apnea    Thyroid disease     Patient Active Problem List   Diagnosis Date Noted   Hyperammonemia ([place]) 06/27/2019   CAP (community acquired pneumonia) 06/13/2019   GERD  (gastroesophageal reflux disease) 10/25/2018   LUQ fullness 07/25/2018   CKD stage 3 due to type 2 diabetes mellitus ([place]) 05/24/2018   Hyponatremia syndrome 05/24/2018   Other dysphagia 09/15/2017   Uncontrolled type 2 diabetes mellitus with complication ([place]) 32/67/1245   Mixed hyperlipidemia 04/27/2017   Hyponatremia 04/27/2017   Chronic diarrhea 12/15/2016   Carpal tunnel syndrome, left    Trigger middle finger of left hand    Thrombocytopenia ([place]) 11/04/2016   Iron deficiency anemia due to chronic blood loss 01/31/2016   Other specified hypothyroidism 09/20/2015   Essential hypertension, benign 07/12/2015   Morbid obesity with BMI of 40.0-44.9, adult ([place]) 07/12/2015   Hepatic cirrhosis ([place]) 04/02/2014   Acute colitis 03/05/2014   Abdominal pain, acute, left lower quadrant 03/05/2014   Colitis 03/05/2014   Splenomegaly, congestive, chronic 02/17/2014   Sepsis ([place]) 02/15/2014   Absolute anemia 02/15/2014   Diabetes mellitus with stage 3 chronic kidney disease ([place]) 02/15/2014   CHF (congestive heart failure) ([place]) 02/15/2014   CKD (chronic kidney disease) 02/15/2014   UTI (lower urinary tract infection) 02/15/2014    Past Surgical History:  Procedure Laterality Date   ABDOMINAL HYSTERECTOMY     APPENDECTOMY     CAROTID ARTERY - SUBCLAVIAN ARTERY BYPASS GRAFT Right    CARPAL TUNNEL RELEASE Right    CARPAL TUNNEL  RELEASE Left 12/03/2016   Procedure: CARPAL TUNNEL RELEASE;  Surgeon: Carole Civil, MD;  Location: [facility] ORS;  Service: Orthopedics;  Laterality: Left;   CATARACT EXTRACTION Bilateral    CHOLECYSTECTOMY     COLONOSCOPY N/A 02/19/2014   Dr. Gala Romney: rectal varices, rectal polyp overlying a varix non-manipulated   ESOPHAGEAL BANDING N/A 08/15/2018   Procedure: ESOPHAGEAL BANDING;  Surgeon: Daneil Dolin, MD;  Location: [facility] ENDO SUITE;  Service: Endoscopy;  Laterality: N/A;   ESOPHAGEAL BANDING N/A 09/15/2018   Procedure:  ESOPHAGEAL BANDING;  Surgeon: Daneil Dolin, MD;  Location: [facility] END[facility] SUITE;  Service: Endoscopy;  Laterality: N/A;   ESOPHAGOGASTRODUODENOSCOPY N/A 02/19/2014   Dr. Gala Romney: 4 columns of grade 2 esophageal varices, multiple gastric polyps with largest biopsied and hyperplastic. Negative H.pylori   ESOPHAGOGASTRODUODENOSCOPY N/A 04/13/2018   Dr. Gala Romney: Grade 3 esophageal varices, no esophagitis, portal gastropathy, multiple hyperplastic appearing polyps   ESOPHAGOGASTRODUODENOSCOPY (EGD) WITH PROPOFOL N/A 08/15/2018   Procedure: ESOPHAGOGASTRODUODENOSCOPY (EGD) WITH PROPOFOL;  Surgeon: Daneil Dolin, MD;  Location: [facility];  Service: Endoscopy;  Laterality: N/A;  1:45pm   ESOPHAGOGASTRODUODENOSCOPY (EGD) WITH PROPOFOL N/A 09/15/2018   Dr. Gala Romney: Grade 2 esophageal varices, portal gastropathy, normal first and second duodenum, s/p banding X 5. Surveillance in 1 year   GIVENS CAPSULE STUDY N/A 05/04/2014   multiple polypoid-like lesions throughout small bowel, scattered superficial non-bleeding erosions more distal bowel, referred to [facility] for enteroscopy. Capsule study not complete, poor images.    TRIGGER FINGER RELEASE Left 12/03/2016   Procedure: RELEASE TRIGGER FINGER/A-1 PULLEY LEFT LONG TRIGGER FINGER RELEASE;  Surgeon: Carole Civil, MD;  Location: [facility] ORS;  Service: Orthopedics;  Laterality: Left;   urosepsis       OB History    Gravida      Para      Term      Preterm      AB      Living  3     SAB      TAB      Ectopic      Multiple      Live Births               Home Medications    Prior to Admission medications   Medication Sig Start Date End Date Taking? Authorizing Provider  amitriptyline (ELAVIL) 25 MG tablet Take 25 mg by mouth at bedtime.    [provider]  aspirin EC 81 MG tablet Take 1 tablet (81 mg total) by mouth daily with breakfast. 06/15/19   Emokpae, Courage, MD  Azelastine HCl 137 MCG/SPRAY SOLN Place 1 spray into both  nostrils as needed (for congestion).  03/21/18   [provider]  benazepril (LOTENSIN) 5 MG tablet Take 1 tablet (5 mg total) by mouth daily. 06/15/19   Roxan Hockey, MD  Continuous Blood Gluc Sensor (FREESTYLE LIBRE 14 DAY SENSOR) MISC 1 each by Does not apply route every 14 (fourteen) days. 01/11/19   Cassandria Anger, MD  CRANBERRY PO Take 15,000 mg by mouth at bedtime.     [provider]  diphenhydrAMINE (SOMINEX) 25 MG tablet Take 50 mg by mouth at bedtime.     [provider]  diphenoxylate-atropine (LOMOTIL) 2.5-0.025 MG tablet 1-2 tablets every 4-6 hours as needed for diarrhea. Patient not taking: Reported on 05/18/2019 04/28/19   Annitta Needs, NP  donepezil (ARICEPT) 5 MG tablet Take 5 mg by mouth at bedtime.  01/12/18  [provider]  ezetimibe (ZETIA) 10 MG tablet Take 10 mg by mouth every morning.     [provider]  ferrous sulfate 325 (65 FE) MG EC tablet Take 1 tablet by mouth daily.    [provider]  gabapentin (NEURONTIN) 100 MG capsule Take 100 mg by mouth 3 (three) times daily. 05/31/19   [provider]  gabapentin (NEURONTIN) 300 MG capsule Take 300 mg by mouth at bedtime.    [provider]  insulin regular human CONCENTRATED (HUMULIN R U-500 KWIKPEN) 500 UNIT/ML kwikpen Inject 60 units with breakfast and supper 40 units with supper when premeal glucose is above 90 06/15/19   Emokpae, Courage, MD  lansoprazole (PREVACID) 30 MG capsule Take 1 capsule (30 mg total) by mouth 2 (two) times daily before a meal. Patient taking differently: Take 30 mg by mouth daily.  07/13/18   Mahala Menghini, PA-C  levothyroxine (SYNTHROID) 100 MCG tablet TAKE 1 TABLET BY MOUTH ONCE DAILY. 06/01/19   Cassandria Anger, MD  Multiple Vitamins-Minerals (CENTRUM SILVER ADULT 50+ PO) Take 1 tablet by mouth daily.    [provider]  ondansetron (ZOFRAN) 4 MG tablet Take 1 tablet (4 mg total) by mouth 4 (four) times  daily -  before meals and at bedtime. 06/15/19   Roxan Hockey, MD  torsemide (DEMADEX) 20 MG tablet Take 1 tablet (20 mg total) by mouth daily. 06/16/19 06/15/20  Roxan Hockey, MD  VICTOZA 18 MG/3ML SOPN INJECT 1.8MG SUBCUTANEOUSLY ONCE DAILY. 04/10/19   Cassandria Anger, MD  Vitamin D, Ergocalciferol, (DRISDOL) 50000 units CAPS capsule Take 50,000 Units by mouth every 30 (thirty) days.  01/20/18   [provider]    Family History Family History  Problem Relation Age of Onset   Diabetes Mother    Hypertension Mother    Kidney failure Mother    Blindness Mother    Aneurysm Father    Colon cancer Neg Hx     Social History Social History   Tobacco Use   Smoking status: Former Smoker    Packs/day: 2.00    Years: 35.00    Pack years: 70.00    Quit date: 05/05/2003    Years since quitting: 16.1   Smokeless tobacco: Never Used   Tobacco comment: Quit smoking 11 years ago  Substance Use Topics   Alcohol use: No   Drug use: No     Allergies   Erythromycin, Niacin, Penicillins, Sulfa antibiotics, Ciprofloxacin, Statins, Codeine, Fluticasone, and Tramadol   Review of Systems Review of Systems  All other systems reviewed and are negative.    Physical Exam Updated Vital Signs BP (!) 115/49    Pulse 88    Temp 97.7 F (36.5 C) (Oral)    Resp 18    Ht 1.549 m (5' 1" )    Wt 104.3 kg    SpO2 93%    BMI 43.46 kg/m   Physical Exam Vitals signs and nursing note reviewed.  Constitutional:      General: She is not in acute distress.    Appearance: She is well-developed.  HENT:     Head: Normocephalic and atraumatic.     Nose: No rhinorrhea.     Mouth/Throat:     Pharynx: No oropharyngeal exudate.  Eyes:     General: No scleral icterus.       Right eye: No discharge.        Left eye: No discharge.     Conjunctiva/sclera: Conjunctivae  normal.     Pupils: Pupils are equal, round, and reactive to light.  Neck:     Musculoskeletal: Normal range of  motion and neck supple.     Thyroid: No thyromegaly.     Vascular: No JVD.  Cardiovascular:     Rate and Rhythm: Normal rate and regular rhythm.     Heart sounds: Normal heart sounds. No murmur. No friction rub. No gallop.   Pulmonary:     Effort: Pulmonary effort is normal. No respiratory distress.     Breath sounds: Normal breath sounds. No wheezing or rales.  Abdominal:     General: Bowel sounds are normal. There is distension.     Palpations: Abdomen is soft. There is no mass.     Tenderness: There is no abdominal tenderness.     Comments: Non tender but distended abdomen.  Musculoskeletal: Normal range of motion.        General: No tenderness.  Lymphadenopathy:     Cervical: No cervical adenopathy.  Skin:    General: Skin is warm and dry.     Findings: No erythema or rash.  Neurological:     Mental Status: She is alert.     Coordination: Coordination normal.     Comments: This patient has clear speech, clear memory, she is able to perform all commands that I request.  She has no slurred speech, she has the ability to finger-nose-finger, she does have mild asterixis with stretching her arms out but at rest has no tremor, she has no intention tremor, she has no pronator drift  Psychiatric:        Behavior: Behavior normal.      ED Treatments / Results  Labs (all labs ordered are listed, but only abnormal results are displayed) Labs Reviewed  CBC WITH DIFFERENTIAL/PLATELET - Abnormal; Notable for the following components:      Result Value   MCH 25.6 (*)    MCHC 29.7 (*)    RDW 23.9 (*)    All other components within normal limits  COMPREHENSIVE METABOLIC PANEL - Abnormal; Notable for the following components:   Sodium 129 (*)    Potassium 6.0 (*)    Chloride 96 (*)    CO2 21 (*)    Glucose, Bld 150 (*)    BUN 89 (*)    Creatinine, Ser 3.86 (*)    Calcium 8.3 (*)    Total Protein 6.1 (*)    Albumin 2.6 (*)    GFR calc non Af Amer 11 (*)    GFR calc Af Amer 13  (*)    All other components within normal limits  MAGNESIUM - Abnormal; Notable for the following components:   Magnesium 2.8 (*)    All other components within normal limits  AMMONIA - Abnormal; Notable for the following components:   Ammonia 105 (*)    All other components within normal limits  URINE CULTURE  URINALYSIS, ROUTINE W REFLEX MICROSCOPIC    EKG EKG Interpretation  Date/Time:  Tuesday June 27 2019 14:54:38 EDT Ventricular Rate:  86 PR Interval:    QRS Duration: 152 QT Interval:  406 QTC Calculation: 486 R Axis:   -175 Text Interpretation:  Sinus rhythm RBBB and LPFB Inferior infarct, old since last tracing no significant change Confirmed by Noemi Chapel [phone]) on 06/27/2019 2:58:55 PM   Radiology No results found.  Procedures Procedures (including critical care time)  Medications Ordered in ED Medications  Azelastine HCl SOLN 1 spray (has no  administration in time range)  aspirin EC tablet 81 mg (has no administration in time range)  amitriptyline (ELAVIL) tablet 25 mg (has no administration in time range)  donepezil (ARICEPT) tablet 5 mg (has no administration in time range)  ezetimibe (ZETIA) tablet 10 mg (has no administration in time range)  ferrous sulfate EC tablet 325 mg (has no administration in time range)  gabapentin (NEURONTIN) capsule 100 mg (has no administration in time range)  pantoprazole (PROTONIX) EC tablet 40 mg (has no administration in time range)  levothyroxine (SYNTHROID) tablet 100 mcg (has no administration in time range)  insulin glargine (LANTUS) injection 20 Units (has no administration in time range)  insulin aspart (novoLOG) injection 0-15 Units (has no administration in time range)  0.9 %  sodium chloride infusion ( Intravenous New Bag/Given 06/27/19 1443)  lactulose (CHRONULAC) 10 GM/15ML solution 30 g (30 g Oral Given 06/27/19 1442)     Initial Impression / Assessment and Plan / ED Course  I have reviewed the triage  vital signs and the nursing notes.  Pertinent labs & imaging results that were available during my care of the patient were reviewed by me and considered in my medical decision making (see chart for details).        Check ammonia, electrolytes, the patient appears stable, vital signs are normal, there is some concern that the patient may have some recurrent hepatic encephalopathy, she does appear a little bit off but is able to answer all my questions appropriately, she does have some asterixis.  The labs show that the patient is suffering with an acute kidney injury and acute renal failure, her potassium is elevated to 6 and her magnesium is elevated as well.  She has an ammonia over 100.  She will need to be admitted to the hospital, given lactulose and IV fluid hydration.  We will check a urine and a postvoid residual to make sure she is not retaining urine.  There is no family member at the bedside who I have obtained more information from, she reports that the patient has been more sleepy than usual, she is not on lactulose, the patient is able to tell me that she is not urinating as much as usual but is able to urinate when she does sit down.  We will check an in and out catheterization to check for postvoid residual as I suspect any bladder scan on her abdomen would be falsely elevated secondary to the patient's ascites  The EKG does not show any signs of acute hyperkalemia, she has a chronic right bundle branch block, this is essentially unchanged compared with prior  Discussed with the hospitalist to admit.  Final Clinical Impressions(s) / ED Diagnoses   Final diagnoses:  Hepatic encephalopathy ([place])  Acute renal failure, unspecified acute renal failure type [facility])  Hyperkalemia    ED Discharge Orders    None       Noemi Chapel, MD 06/27/19 1459

## 2019-06-27 NOTE — ED Triage Notes (Signed)
Patient states she has been having tremors in her hands and left leg since leaving this hospital October 6 for a paracentesis.

## 2019-06-27 NOTE — Progress Notes (Signed)
CPAP unit at bedside. Patient's COVID test is not back yet so unable to place on patient at this time. Discussed this with patient and she was agreeable to waiting until test results came back.

## 2019-06-27 NOTE — ED Notes (Signed)
Call daughter if need anything. Works at [employer] Can call work until [place]

## 2019-06-27 NOTE — H&P (Signed)
History and Physical    Penny Martinez MMH:[id number] DOB: 20-Jul-1951 DOA: 06/27/2019  Referring MD/NP/PA: Dr. Sabra Heck. PCP: Penny Schwalbe, MD  Patient coming from: Home  Chief Complaint: Confusion, tremors and not feeling well.  HPI: Penny Martinez is a 68 y.o. female with past medical history significant for gastroesophageal reflux disease, diastolic heart failure, NAS cirrhosis, morbid obesity, dyslipidemia, hypertension, type 2 diabetes mellitus with nephropathy, chronic kidney disease stage III, hypothyroidism and a sleep apnea; who presented to the emergency department secondary to confusion tremors/muscle spasm and not feeling well.  Patient expressed having her usual paracentesis approximately 1 week or so ago and since that procedure has been experiencing mild tremors or jerking movement in her arms or legs, which has worsening in the last 2 days prior to admission.  Patient also reports feeling somnolent, with decreased appetite and intermittently confused.  Patient denies any shortness of breath, cough, fever, chills, nausea, vomiting, chest pain, palpitations, dysuria, hematuria, melena, hematochezia or any other complaints.  In the ED work-up has demonstrated elevated ammonia level in the 106 range, worsening renal function with a creatinine of 3.86, potassium 6.0 and sodium of 129. CO2 21.  Patient received a dose of lactulose, started for gentle fluid resuscitation, urinalysis has been check, bicarbonate and calcium gluconate given.  EKG without acute ischemic changes and normal sinus rhythm.  TRH has been contacted to admit patient for further evaluation and management of acute on chronic renal failure and hyperammonemia.   Past Medical/Surgical History: Past Medical History:  Diagnosis Date  . Acid reflux   . Anemia   . Arthritis   . C. difficile colitis June/July 2015  . Cancer ([place])    skin cancer  . CHF (congestive heart failure) ([place])   . Cirrhosis ([place])    likely due to NASH. Negative viral markers 2015  . Complication of anesthesia   . COPD (chronic obstructive pulmonary disease) ([place])   . Diabetes mellitus without complication ([place])   . GERD (gastroesophageal reflux disease)   . History of kidney stones   . Hypercholesteremia   . Hypertension   . Hypothyroidism   . IBS (irritable bowel syndrome)   . Kidney disease   . Liver disease   . Neuropathy   . PONV (postoperative nausea and vomiting)   . Sleep apnea   . Thyroid disease     Past Surgical History:  Procedure Laterality Date  . ABDOMINAL HYSTERECTOMY    . APPENDECTOMY    . CAROTID ARTERY - SUBCLAVIAN ARTERY BYPASS GRAFT Right   . CARPAL TUNNEL RELEASE Right   . CARPAL TUNNEL RELEASE Left 12/03/2016   Procedure: CARPAL TUNNEL RELEASE;  Surgeon: Carole Civil, MD;  Location: [facility];  Service: Orthopedics;  Laterality: Left;  . CATARACT EXTRACTION Bilateral   . CHOLECYSTECTOMY    . COLONOSCOPY N/A 02/19/2014   Dr. Gala Romney: rectal varices, rectal polyp overlying a varix non-manipulated  . ESOPHAGEAL BANDING N/A 08/15/2018   Procedure: ESOPHAGEAL BANDING;  Surgeon: Daneil Dolin, MD;  Location: [facility];  Service: Endoscopy;  Laterality: N/A;  . ESOPHAGEAL BANDING N/A 09/15/2018   Procedure: ESOPHAGEAL BANDING;  Surgeon: Daneil Dolin, MD;  Location: [facility];  Service: Endoscopy;  Laterality: N/A;  . ESOPHAGOGASTRODUODENOSCOPY N/A 02/19/2014   Dr. Gala Romney: 4 columns of grade 2 esophageal varices, multiple gastric polyps with largest biopsied and hyperplastic. Negative H.pylori  . ESOPHAGOGASTRODUODENOSCOPY N/A 04/13/2018   Dr. Gala Romney: Grade 3 esophageal varices, no esophagitis, portal gastropathy,  multiple hyperplastic appearing polyps  . ESOPHAGOGASTRODUODENOSCOPY (EGD) WITH PROPOFOL N/A 08/15/2018   Procedure: ESOPHAGOGASTRODUODENOSCOPY (EGD) WITH PROPOFOL;  Surgeon: Daneil Dolin, MD;  Location: [facility];  Service: Endoscopy;  Laterality: N/A;  1:45pm  .  ESOPHAGOGASTRODUODENOSCOPY (EGD) WITH PROPOFOL N/A 09/15/2018   Dr. Gala Romney: Grade 2 esophageal varices, portal gastropathy, normal first and second duodenum, s/p banding X 5. Surveillance in 1 year  . GIVENS CAPSULE STUDY N/A 05/04/2014   multiple polypoid-like lesions throughout small bowel, scattered superficial non-bleeding erosions more distal bowel, referred to [facility] for enteroscopy. Capsule study not complete, poor images.   Marland Kitchen TRIGGER FINGER RELEASE Left 12/03/2016   Procedure: RELEASE TRIGGER FINGER/A-1 PULLEY LEFT LONG TRIGGER FINGER RELEASE;  Surgeon: Carole Civil, MD;  Location: [facility];  Service: Orthopedics;  Laterality: Left;  . urosepsis      Social History:  reports that she quit smoking about 16 years ago. She has a 70.00 pack-year smoking history. She has never used smokeless tobacco. She reports that she does not drink alcohol or use drugs.  Allergies: Allergies  Allergen Reactions  . Erythromycin Hives  . Niacin Rash and Shortness Of Breath  . Penicillins Shortness Of Breath and Other (See Comments)    Has patient had a PCN reaction causing immediate rash, facial/tongue/throat swelling, SOB or lightheadedness with hypotension: Yes Has patient had a PCN reaction causing severe rash involving mucus membranes or skin necrosis: No Has patient had a PCN reaction that required hospitalization: No Has patient had a PCN reaction occurring within the last 10 years: No If all of the above answers are "NO", then may proceed with Cephalosporin use.   . Sulfa Antibiotics Hives  . Ciprofloxacin Itching and Nausea And Vomiting    Stomach pain  . Statins Other (See Comments)    Muscle Pain  . Codeine Itching  . Fluticasone Rash  . Tramadol Rash    Family History:  Family History  Problem Relation Age of Onset  . Diabetes Mother   . Hypertension Mother   . Kidney failure Mother   . Blindness Mother   . Aneurysm Father   . Colon cancer Neg Hx     Prior to Admission  medications   Medication Sig Start Date End Date Taking? Authorizing Provider  amitriptyline (ELAVIL) 25 MG tablet Take 25 mg by mouth at bedtime.    [provider]  aspirin EC 81 MG tablet Take 1 tablet (81 mg total) by mouth daily with breakfast. 06/15/19   Emokpae, Courage, MD  Azelastine HCl 137 MCG/SPRAY SOLN Place 1 spray into both nostrils as needed (for congestion).  03/21/18   [provider]  benazepril (LOTENSIN) 5 MG tablet Take 1 tablet (5 mg total) by mouth daily. 06/15/19   Roxan Hockey, MD  Continuous Blood Gluc Sensor (FREESTYLE LIBRE 14 DAY SENSOR) MISC 1 each by Does not apply route every 14 (fourteen) days. 01/11/19   Cassandria Anger, MD  CRANBERRY PO Take 15,000 mg by mouth at bedtime.     [provider]  diphenhydrAMINE (SOMINEX) 25 MG tablet Take 50 mg by mouth at bedtime.     [provider]  diphenoxylate-atropine (LOMOTIL) 2.5-0.025 MG tablet 1-2 tablets every 4-6 hours as needed for diarrhea. Patient not taking: Reported on 05/18/2019 04/28/19   Annitta Needs, NP  donepezil (ARICEPT) 5 MG tablet Take 5 mg by mouth at bedtime.  01/12/18   [provider]  ezetimibe (ZETIA) 10 MG tablet Take 10  mg by mouth every morning.     [provider]  ferrous sulfate 325 (65 FE) MG EC tablet Take 1 tablet by mouth daily.    [provider]  gabapentin (NEURONTIN) 100 MG capsule Take 100 mg by mouth 3 (three) times daily. 05/31/19   [provider]  gabapentin (NEURONTIN) 300 MG capsule Take 300 mg by mouth at bedtime.    [provider]  insulin regular human CONCENTRATED (HUMULIN R U-500 KWIKPEN) 500 UNIT/ML kwikpen Inject 60 units with breakfast and supper 40 units with supper when premeal glucose is above 90 06/15/19   Emokpae, Courage, MD  lansoprazole (PREVACID) 30 MG capsule Take 1 capsule (30 mg total) by mouth 2 (two) times daily before a meal. Patient taking differently: Take 30 mg by mouth  daily.  07/13/18   Mahala Menghini, PA-C  levothyroxine (SYNTHROID) 100 MCG tablet TAKE 1 TABLET BY MOUTH ONCE DAILY. 06/01/19   Cassandria Anger, MD  Multiple Vitamins-Minerals (CENTRUM SILVER ADULT 50+ PO) Take 1 tablet by mouth daily.    [provider]  ondansetron (ZOFRAN) 4 MG tablet Take 1 tablet (4 mg total) by mouth 4 (four) times daily -  before meals and at bedtime. 06/15/19   Roxan Hockey, MD  torsemide (DEMADEX) 20 MG tablet Take 1 tablet (20 mg total) by mouth daily. 06/16/19 06/15/20  Roxan Hockey, MD  VICTOZA 18 MG/3ML SOPN INJECT 1.8MG SUBCUTANEOUSLY ONCE DAILY. 04/10/19   Cassandria Anger, MD  Vitamin D, Ergocalciferol, (DRISDOL) 50000 units CAPS capsule Take 50,000 Units by mouth every 30 (thirty) days.  01/20/18   [provider]    Review of Systems:  Negative except as otherwise mentioned in HPI.  Physical Exam: Vitals:   06/27/19 1254 06/27/19 1255 06/27/19 1420  BP: (!) 111/45  (!) 115/49  Pulse: 86  88  Resp: 18  18  Temp: 97.7 F (36.5 C)    TempSrc: Oral    SpO2: 98%  93%  Weight:  104.3 kg   Height:  5' 1"  (1.549 m)     Constitutional: Somnolent and slightly confused; afebrile, denies chest pain, no palpitations, no nausea or vomiting. Eyes: PERRL, lids and conjunctivae normal; no icterus, no nystagmus. ENMT: Mucous membranes are moist. Posterior pharynx clear of any exudate or lesions. No Thrush. Neck: normal, supple, no masses, no thyromegaly, unable to assess JVD due to body habitus. Respiratory: clear to auscultation bilaterally, no wheezing, no crackles. Normal respiratory effort. No accessory muscle use.  Cardiovascular: Regular rate and rhythm, no murmurs / rubs / gallops.  Abdomen: Obese, distended with positive fluid wave from ascites, no guarding, soft; positive bowel sounds.  Musculoskeletal: no clubbing / cyanosis. No joint deformity upper and lower extremities. Good ROM, no contractures. Normal muscle tone.  2+  edema bilaterally up to mid thighs. Skin: no rashes, lesions, ulcers. Neurologic: CN 2-12 grossly intact. Sensation intact, DTR normal. Strength 5/5 in all 4.  Positive asterixis. Psychiatric: Normal judgment and insight. Alert and oriented x 3. Normal mood.    Labs on Admission: I have personally reviewed the following labs and imaging studies  CBC: Recent Labs  Lab 06/27/19 1334  WBC 8.7  NEUTROABS 6.9  HGB 12.1  HCT 40.7  MCV 86.0  PLT 256   Basic Metabolic Panel: Recent Labs  Lab 06/27/19 1334  NA 129*  K 6.0*  CL 96*  CO2 21*  GLUCOSE 150*  BUN 89*  CREATININE 3.86*  CALCIUM 8.3*  MG  2.8*   GFR: Estimated Creatinine Clearance: 15.7 mL/min (A) (by C-G formula based on SCr of 3.86 mg/dL (H)).   Liver Function Tests: Recent Labs  Lab 06/27/19 1334  AST 39  ALT 22  ALKPHOS 109  BILITOT 0.7  PROT 6.1*  ALBUMIN 2.6*    Recent Labs  Lab 06/27/19 1334  AMMONIA 105*   Urine analysis:    Component Value Date/Time   COLORURINE YELLOW 06/13/2019 1808   APPEARANCEUR HAZY (A) 06/13/2019 1808   LABSPEC 1.013 06/13/2019 1808   PHURINE 5.0 06/13/2019 1808   GLUCOSEU NEGATIVE 06/13/2019 1808   HGBUR NEGATIVE 06/13/2019 1808   BILIRUBINUR NEGATIVE 06/13/2019 1808   KETONESUR NEGATIVE 06/13/2019 1808   PROTEINUR NEGATIVE 06/13/2019 1808   UROBILINOGEN 0.2 03/05/2014 2140   NITRITE NEGATIVE 06/13/2019 1808   LEUKOCYTESUR TRACE (A) 06/13/2019 1808   Radiological Exams on Admission: No results found.  EKG: Independently reviewed.  Positive right bundle branch block (no new), poor R wave progression and inferior leads Q waves; findings unchanged from previous tracing.   Assessment/Plan 1-acute on chronic renal failure -Appears to be secondary to prerenal azotemia and continue use of nephrotoxic agents -Will discontinue diuretics and benazepril -Gentle fluid resuscitation will be provided -Check urinalysis -Follow renal function trend -If renal function  fails to improve will require nephrology service consultation.  2-acute hepatic encephalopathy/hyperammonemia ([place]) -Patient with underlying history of [name] cirrhosis no chronically on lactulose -Patient will be started on lactulose -Follow mental status. -Normal LFTs.  3-hypokalemia -Potassium 6.0 -Calcium gluconate, bicarbonate, lactulose have been given in the ED -Will discontinue benazepril and aspirin potential diuretics at this moment. -Follow electrolytes trend -Patient started on insulin as per home regimen as well -Admitted to telemetry bed.  4-type 2 diabetes with nephropathy and neuropathy. -Continue sliding scale insulin and twice a day Lantus -Follow CBGs and adjust hypoglycemic regimen as needed -A1c in September 2020, was in the 7 range.  5-morbid obesity -Body mass index is 43.46 kg/m. -Low calorie diet, portion control and increase physical activity discussed with patient.  6-gastroesophageal reflux disease -Continue PPI.  7-dyslipidemia -Continue Zetia  8-hypothyroidism -Will check TSH -continue Synthroid.  9-diabetic neuropathy -Continue Neurontin  10-history of depression and mild dementia -Continue low-dose Aricept and Elavil. -Stable mood.  DVT prophylaxis: SCDs Code Status: Full code Family Communication: no family at bedside   Disposition Plan: To be determined.  Hopefully discharge back home once hepatic encephalopathy/hyperammonemia and renal function is stabilized. Consults called: None; but depending renal function trend might required inputs from nephrology service.  Admission status: Inpatient, length of stay more than 2 midnights; telemetry bed.   Time Spent: 70 minutes  Barton Dubois MD [facility] Pager [phone]    06/27/2019, 3:01 PM

## 2019-06-28 DIAGNOSIS — K7460 Unspecified cirrhosis of liver: Secondary | ICD-10-CM

## 2019-06-28 DIAGNOSIS — R188 Other ascites: Secondary | ICD-10-CM

## 2019-06-28 LAB — COMPREHENSIVE METABOLIC PANEL
ALT: 24 U/L (ref 0–44)
AST: 50 U/L — ABNORMAL HIGH (ref 15–41)
Albumin: 2.9 g/dL — ABNORMAL LOW (ref 3.5–5.0)
Alkaline Phosphatase: 131 U/L — ABNORMAL HIGH (ref 38–126)
Anion gap: 15 (ref 5–15)
BUN: 90 mg/dL — ABNORMAL HIGH (ref 8–23)
CO2: 18 mmol/L — ABNORMAL LOW (ref 22–32)
Calcium: 8.3 mg/dL — ABNORMAL LOW (ref 8.9–10.3)
Chloride: 100 mmol/L (ref 98–111)
Creatinine, Ser: 3.48 mg/dL — ABNORMAL HIGH (ref 0.44–1.00)
GFR calc Af Amer: 15 mL/min — ABNORMAL LOW (ref >60)
GFR calc non Af Amer: 13 mL/min — ABNORMAL LOW (ref >60)
Glucose, Bld: 185 mg/dL — ABNORMAL HIGH (ref 70–99)
Potassium: 6.1 mmol/L — ABNORMAL HIGH (ref 3.5–5.1)
Sodium: 133 mmol/L — ABNORMAL LOW (ref 135–145)
Total Bilirubin: 1.2 mg/dL (ref 0.3–1.2)
Total Protein: 6.8 g/dL (ref 6.5–8.1)

## 2019-06-28 LAB — URINE CULTURE: Culture: NO GROWTH

## 2019-06-28 LAB — SARS CORONAVIRUS 2 (TAT 6-24 HRS): SARS Coronavirus 2: NEGATIVE

## 2019-06-28 LAB — AMMONIA: Ammonia: 84 umol/L — ABNORMAL HIGH (ref 9–35)

## 2019-06-28 LAB — GLUCOSE, CAPILLARY
Glucose-Capillary: 199 mg/dL — ABNORMAL HIGH (ref 70–99)
Glucose-Capillary: 198 mg/dL — ABNORMAL HIGH (ref 70–99)
Glucose-Capillary: 248 mg/dL — ABNORMAL HIGH (ref 70–99)
Glucose-Capillary: 285 mg/dL — ABNORMAL HIGH (ref 70–99)

## 2019-06-28 MED ORDER — SODIUM CHLORIDE 0.9 % IV SOLN
INTRAVENOUS | Status: DC
  Administered 2019-06-28 – 2019-07-03 (×2): via INTRAVENOUS

## 2019-06-28 MED ORDER — CHLORHEXIDINE GLUCONATE CLOTH 2 % EX PADS
6.0000 | MEDICATED_PAD | Freq: Every day | CUTANEOUS | Status: DC
  Administered 2019-06-28 – 2019-07-04 (×7): 6 via TOPICAL

## 2019-06-28 MED ORDER — SODIUM POLYSTYRENE SULFONATE 15 GM/60ML PO SUSP
30.0000 g | Freq: Once | ORAL | Status: AC
  Administered 2019-06-28: 30 g via ORAL
  Filled 2019-06-28: qty 120

## 2019-06-28 MED ORDER — HEPARIN SODIUM (PORCINE) 5000 UNIT/ML IJ SOLN
5000.0000 [IU] | Freq: Three times a day (TID) | INTRAMUSCULAR | Status: DC
  Administered 2019-06-28 – 2019-07-04 (×18): 5000 [IU] via SUBCUTANEOUS
  Filled 2019-06-28 (×19): qty 1

## 2019-06-28 NOTE — Progress Notes (Signed)
Patient is refusing the use of CPAP for tonight. RT informed patient if she changes her mind have RN contact RT. 

## 2019-06-28 NOTE — TOC Initial Note (Signed)
Transition of Care [facility]) - Initial/Assessment Note    Patient Details  Name: Penny Martinez MRN: [id number] Date of Birth: 07/14/1951  [facility]) CM/SW Contact:    Shade Flood, LCSW Phone Number: 06/28/2019, 11:51 AM  Clinical Narrative:                  Pt admitted from home. She is high risk for readmission. Met with pt this AM to assess. Per pt, she lives with her husband. Pt states she has a cane but hasn't needed to use it. Per pt, her husband drives her to appointments. She does not have any difficulty obtaining her prescriptions.   Per pt, she has had Home Health from [facility] in the past and she would be agreeable to that again if needed.  TOC will follow and continue to assess and assist with dc planning.  Expected Discharge Plan: [place] Barriers to Discharge: Continued Medical Work up   Patient Goals and CMS Choice Patient states their goals for this hospitalization and ongoing recovery are:: Feel better and return home      Expected Discharge Plan and Services Expected Discharge Plan: [place] In-house Referral: Clinical Social Work     Living arrangements for the past 2 months: Single Family Home                                      Prior Living Arrangements/Services Living arrangements for the past 2 months: Single Family Home Lives with:: Spouse Patient language and need for interpreter reviewed:: Yes Do you feel safe going back to the place where you live?: Yes      Need for Family Participation in Patient Care: Yes (Comment) Care giver support system in place?: Yes (comment) Current home services: DME Criminal Activity/Legal Involvement Pertinent to Current Situation/Hospitalization: No - Comment as needed  Activities of Daily Living Home Assistive Devices/Equipment: Blood pressure cuff, CBG Meter, Cane (specify quad or straight), CPAP ADL Screening (condition at time  of admission) Patient's cognitive ability adequate to safely complete daily activities?: Yes Is the patient deaf or have difficulty hearing?: No Does the patient have difficulty seeing, even when wearing glasses/contacts?: No Does the patient have difficulty concentrating, remembering, or making decisions?: No Patient able to express need for assistance with ADLs?: Yes Does the patient have difficulty dressing or bathing?: No Independently performs ADLs?: Yes (appropriate for developmental age) Does the patient have difficulty walking or climbing stairs?: No Weakness of Legs: Both Weakness of Arms/Hands: Both  Permission Sought/Granted                  Emotional Assessment Appearance:: Appears stated age Attitude/Demeanor/Rapport: Engaged Affect (typically observed): Pleasant Orientation: : Oriented to Self, Oriented to Place, Oriented to  Time, Oriented to Situation Alcohol / Substance Use: Not Applicable Psych Involvement: No (comment)  Admission diagnosis:  Hepatic encephalopathy ([place]) [K72.90] Hyperkalemia [E87.5] Acute renal failure, unspecified acute renal failure type ([place]) [N17.9] Patient Active Problem List   Diagnosis Date Noted  . Hyperammonemia ([place]) 06/27/2019  . Acute on chronic renal failure ([place]) 06/27/2019  . CAP (community acquired pneumonia) 06/13/2019  . GERD (gastroesophageal reflux disease) 10/25/2018  . LUQ fullness 07/25/2018  . CKD stage 3 due to type 2 diabetes mellitus ([place]) 05/24/2018  . Hyponatremia syndrome 05/24/2018  . Other dysphagia 09/15/2017  . Uncontrolled  type 2 diabetes mellitus with complication ([place]) 72/05/4708  . Mixed hyperlipidemia 04/27/2017  . Hyponatremia 04/27/2017  . Chronic diarrhea 12/15/2016  . Carpal tunnel syndrome, left   . Trigger middle finger of left hand   . Thrombocytopenia ([place]) 11/04/2016  . Iron deficiency anemia due to chronic blood loss 01/31/2016  . Other specified hypothyroidism 09/20/2015  .  Essential hypertension, benign 07/12/2015  . Morbid obesity with BMI of 40.0-44.9, adult ([place]) 07/12/2015  . Hepatic cirrhosis ([place]) 04/02/2014  . Acute colitis 03/05/2014  . Abdominal pain, acute, left lower quadrant 03/05/2014  . Colitis 03/05/2014  . Splenomegaly, congestive, chronic 02/17/2014  . Sepsis ([place]) 02/15/2014  . Absolute anemia 02/15/2014  . Diabetes mellitus with stage 3 chronic kidney disease ([place]) 02/15/2014  . CHF (congestive heart failure) ([place]) 02/15/2014  . CKD (chronic kidney disease) 02/15/2014  . UTI (lower urinary tract infection) 02/15/2014   PCP:  Vidal Schwalbe, MD Pharmacy:   [place], [place] - [place] [address] Phone: [phone] Fax: [phone]  [place], [place] - [address] [place] [address] Phone: [phone] Fax: [phone]     Social Determinants of Health (SDOH) Interventions    Readmission Risk Interventions Readmission Risk Prevention Plan 06/28/2019 06/15/2019 06/14/2019  Transportation Screening Complete - Complete  PCP or Specialist Appt within 3-5 Days - Complete -  HRI or Home Care Consult Complete Complete Complete  Social Work Consult for [place] Planning/Counseling Complete - Complete  Palliative Care Screening Not Applicable - Not Applicable  Medication Review Press photographer) Complete - Complete  Some recent data might be hidden

## 2019-06-28 NOTE — Progress Notes (Signed)
Patient Demographics:    Penny Martinez, is a 68 y.o. female, DOB - [birth year], ZOX:[id number]  Admit date - 06/27/2019   Admitting Physician Vassie Loll, MD  Outpatient Primary MD for the patient is Penny Robert, MD  LOS - 1   Chief Complaint  Patient presents with  . Tremors        Subjective:    [name] today has no fevers, no emesis,  No chest pain,  --- less confused, more awake   Assessment  & Plan :    Principal Problem:   Acute on chronic renal failure (HCC) Active Problems:   Diabetes mellitus with stage 3 chronic kidney disease (HCC)   Hepatic cirrhosis (HCC)   Essential hypertension, benign   Morbid obesity with BMI of 40.0-44.9, adult (HCC)   Other specified hypothyroidism   Mixed hyperlipidemia   GERD (gastroesophageal reflux disease)   Hyperammonemia (HCC)  Brief summary 68 y.o. female with past medical history significant for gastroesophageal reflux disease, diastolic heart failure, NAS cirrhosis, morbid obesity, dyslipidemia, hypertension, type 2 diabetes mellitus with nephropathy, chronic kidney disease stage III, hypothyroidism and a sleep apnea  A/p 1) hepatic encephalopathy in the setting of underlying Nash liver cirrhosis--on admission ammonia was 105, down to 84, continue lactulose -Clinically improving  2)AKI----acute kidney injury on CKD stage -hyperkalemia noted, worsening renal function due to decreased p.o. intake in the setting of hepatic encephalopathy compounded by benazepril and torsemide use   creatinine on admission=3.86  ,   baseline creatinine =1.4    , creatinine is now= 3.48    , renally adjust medications, avoid nephrotoxic agents/dehydration/hypotension -Hold benazepril and torsemide -Give Kayexalate given persistent hyperkalemia with potassium of 6.1 (was 6.0)  3)DM2-with nephropathy and neuropathy--continue insulin and sliding scale  coverage  4)GERD-continue Protonix  5)Hypothyroidism-continue levothyroxine  Disposition/Need for in-Hospital Stay- patient unable to be discharged at this time due to -worsening renal function and persistent hyperkalemia as well as hepatic encephalopathy requiring lactulose with risk for further dehydration and worsening renal failure  Code Status : Full  Family Communication:   NA (patient is alert, awake and coherent)   Disposition Plan  : Anticipate discharge home with home health services  Consults  :  NA  DVT Prophylaxis  :  - Heparin - SCDs  Lab Results  Component Value Date   PLT 183 06/27/2019    Inpatient Medications  Scheduled Meds: . amitriptyline  25 mg Oral QHS  . aspirin EC  81 mg Oral Q breakfast  . Chlorhexidine Gluconate Cloth  6 each Topical Daily  . donepezil  5 mg Oral QHS  . ezetimibe  10 mg Oral q morning - 10a  . ferrous sulfate  325 mg Oral Daily  . gabapentin  100 mg Oral TID  . insulin aspart  0-15 Units Subcutaneous TID WC  . insulin glargine  20 Units Subcutaneous BID  . lactulose  30 g Oral BID  . levothyroxine  100 mcg Oral Daily  . pantoprazole  40 mg Oral Daily   Continuous Infusions: PRN Meds:.azelastine, ondansetron **OR** ondansetron (ZOFRAN) IV    Anti-infectives (From admission, onward)   None        Objective:   Vitals:   06/27/19 2348 06/28/19 0417  06/28/19 0911 06/28/19 1300  BP:  112/62  99/66  Pulse:  93  93  Resp:  18  18  Temp:  (!) 97.4 F (36.3 C)  97.6 F (36.4 C)  TempSrc:  Oral  Oral  SpO2: 95% 95% 94% 96%  Weight:      Height:        Wt Readings from Last 3 Encounters:  06/27/19 104.3 kg  06/15/19 106.1 kg  05/18/19 102.5 kg     Intake/Output Summary (Last 24 hours) at 06/28/2019 1809 Last data filed at 06/28/2019 1500 Gross per 24 hour  Intake 530 ml  Output -  Net 530 ml     Physical Exam  Gen:-More awake and more alert HEENT:- .AT, No sclera icterus Neck-Supple Neck,No JVD,.   Lungs-  CTAB , fair symmetrical air movement CV- S1, S2 normal, regular  Abd-  +ve B.Sounds, Abd Soft, No tenderness,    Extremity/Skin:- No  edema, pedal pulses present  Psych-affect is appropriate, oriented x3 Neuro-generalized weakness without new focal deficits,  no tremors GU-Foley with somewhat darkish urine  Data Review:   Micro Results Recent Results (from the past 240 hour(s))  SARS CORONAVIRUS 2 (TAT 6-24 HRS) Nasopharyngeal Nasopharyngeal Swab     Status: None   Collection Time: 06/27/19  4:33 PM   Specimen: Nasopharyngeal Swab  Result Value Ref Range Status   SARS Coronavirus 2 NEGATIVE NEGATIVE Final    Comment: (NOTE) SARS-CoV-2 target nucleic acids are NOT DETECTED. The SARS-CoV-2 RNA is generally detectable in upper and lower respiratory specimens during the acute phase of infection. Negative results do not preclude SARS-CoV-2 infection, do not rule out co-infections with other pathogens, and should not be used as the sole basis for treatment or other patient management decisions. Negative results must be combined with clinical observations, patient history, and epidemiological information. The expected result is Negative. Fact Sheet for Patients: HairSlick.no Fact Sheet for Healthcare Providers: quierodirigir.com This test is not yet approved or cleared by the [place] FDA and  has been authorized for detection and/or diagnosis of SARS-CoV-2 by FDA under an Emergency Use Authorization (EUA). This EUA will remain  in effect (meaning this test can be used) for the duration of the COVID-19 declaration under Section 56 4(b)(1) of the Act, 21 U.S.C. section 360bbb-3(b)(1), unless the authorization is terminated or revoked sooner. Performed at [facility], [address] [place], [place] [address]     Radiology Reports US Paracentesis  Result Date: 06/21/2019 INDICATION: Ascites EXAM:  ULTRASOUND GUIDED  PARACENTESIS MEDICATIONS: None. COMPLICATIONS: None immediate. PROCEDURE: Informed written consent was obtained from the patient after a discussion of the risks, benefits and alternatives to treatment. A timeout was performed prior to the initiation of the procedure. Initial ultrasound scanning demonstrates a large amount of ascites within the right lower abdominal quadrant. The right lower abdomen was prepped and draped in the usual sterile fashion. 1% lidocaine was used for local anesthesia. Following this, a 19 gauge, 7-cm, Yueh catheter was introduced. An ultrasound image was saved for documentation purposes. The paracentesis was performed. The catheter was removed and a dressing was applied. The patient tolerated the procedure well without immediate post procedural complication. Patient received post-procedure intravenous albumin; see nursing notes for details. FINDINGS: A total of approximately 7.2 liters of clear yellow fluid was removed. Samples were sent to the laboratory as requested by the clinical team. IMPRESSION: Successful ultrasound-guided paracentesis yielding 7.2 liters of peritoneal fluid. Electronically Signed  By: Charlett Nose M.D.   On: 06/21/2019 15:07   US Paracentesis  Result Date: 05/31/2019 INDICATION: Recurrent ascites EXAM: ULTRASOUND GUIDED Left PARACENTESIS MEDICATIONS: 10 cc 1% lidocaine COMPLICATIONS: None immediate. PROCEDURE: Informed written consent was obtained from the patient after a discussion of the risks, benefits and alternatives to treatment. A timeout was performed prior to the initiation of the procedure. Initial ultrasound scanning demonstrates a large amount of ascites within the left lower abdominal quadrant. The left lower abdomen was prepped and draped in the usual sterile fashion. 1% lidocaine was used for local anesthesia. Following this, a 19 gauge Yueh catheter was introduced. An ultrasound image was saved for documentation purposes. The  paracentesis was performed. The catheter was removed and a dressing was applied. The patient tolerated the procedure well without immediate post procedural complication. Patient did not receive post-procedure intravenous albumin per MD. FINDINGS: A total of approximately 3.6 of yellow fluid was removed. Samples were sent to the laboratory as requested by the clinical team. IMPRESSION: Successful ultrasound-guided paracentesis yielding 3.6 liters of peritoneal fluid. Read by Robet Leu [facility] Electronically Signed   By: Ulyses Southward M.D.   On: 05/31/2019 13:43   Dg Chest Port 1 View  Result Date: 06/13/2019 CLINICAL DATA:  Cough and dyspnea EXAM: PORTABLE CHEST 1 VIEW COMPARISON:  05/23/2018 chest radiograph. FINDINGS: Stable cardiomediastinal silhouette with top-normal heart size. No pneumothorax. Small left pleural effusion. No right pleural effusion. Patchy left lung base consolidation. No pulmonary edema. IMPRESSION: 1. Patchy left lung base consolidation suspicious for pneumonia. Recommend follow-up PA and lateral post treatment chest radiographs in 4-6 weeks. 2. Small left pleural effusion. Electronically Signed   By: Delbert Phenix M.D.   On: 06/13/2019 20:45     CBC Recent Labs  Lab 06/27/19 1334  WBC 8.7  HGB 12.1  HCT 40.7  PLT 183  MCV 86.0  MCH 25.6*  MCHC 29.7*  RDW 23.9*  LYMPHSABS 0.9  MONOABS 0.8  EOSABS 0.1  BASOSABS 0.0    Chemistries  Recent Labs  Lab 06/27/19 1334 06/28/19 0855  NA 129* 133*  K 6.0* 6.1*  CL 96* 100  CO2 21* 18*  GLUCOSE 150* 185*  BUN 89* 90*  CREATININE 3.86* 3.48*  CALCIUM 8.3* 8.3*  MG 2.9*  2.8*  --   AST 39 50*  ALT 22 24  ALKPHOS 109 131*  BILITOT 0.7 1.2   ------------------------------------------------------------------------------------------------------------------ No results for input(s): CHOL, HDL, LDLCALC, TRIG, CHOLHDL, LDLDIRECT in the last 72 hours.  Lab Results  Component Value Date   HGBA1C 7.0 (H) 05/31/2019    ------------------------------------------------------------------------------------------------------------------ Recent Labs    06/27/19 1334  TSH 2.993   ------------------------------------------------------------------------------------------------------------------ No results for input(s): VITAMINB12, FOLATE, FERRITIN, TIBC, IRON, RETICCTPCT in the last 72 hours.  Coagulation profile No results for input(s): INR, PROTIME in the last 168 hours.  No results for input(s): DDIMER in the last 72 hours.  Cardiac Enzymes No results for input(s): CKMB, TROPONINI, MYOGLOBIN in the last 168 hours.  Invalid input(s): CK ------------------------------------------------------------------------------------------------------------------    Component Value Date/Time   BNP 34.0 06/13/2019 1926     Kynlee Koenigsberg M.D on 06/28/2019 at 6:09 PM  Go to [email or web address] - for contact info  [facility] - Office  [phone]

## 2019-06-29 DIAGNOSIS — I10 Essential (primary) hypertension: Secondary | ICD-10-CM

## 2019-06-29 LAB — CBC
HCT: 36.9 % (ref 36.0–46.0)
Hemoglobin: 11.2 g/dL — ABNORMAL LOW (ref 12.0–15.0)
MCH: 26.1 pg (ref 26.0–34.0)
MCHC: 30.4 g/dL (ref 30.0–36.0)
MCV: 86 fL (ref 80.0–100.0)
Platelets: 181 10*3/uL (ref 150–400)
RBC: 4.29 MIL/uL (ref 3.87–5.11)
RDW: 23.9 % — ABNORMAL HIGH (ref 11.5–15.5)
WBC: 6.6 10*3/uL (ref 4.0–10.5)
nRBC: 0 % (ref 0.0–0.2)

## 2019-06-29 LAB — GLUCOSE, CAPILLARY
Glucose-Capillary: 254 mg/dL — ABNORMAL HIGH (ref 70–99)
Glucose-Capillary: 278 mg/dL — ABNORMAL HIGH (ref 70–99)
Glucose-Capillary: 245 mg/dL — ABNORMAL HIGH (ref 70–99)
Glucose-Capillary: 314 mg/dL — ABNORMAL HIGH (ref 70–99)

## 2019-06-29 LAB — COMPREHENSIVE METABOLIC PANEL
ALT: 20 U/L (ref 0–44)
AST: 34 U/L (ref 15–41)
Albumin: 2.4 g/dL — ABNORMAL LOW (ref 3.5–5.0)
Alkaline Phosphatase: 117 U/L (ref 38–126)
Anion gap: 11 (ref 5–15)
BUN: 91 mg/dL — ABNORMAL HIGH (ref 8–23)
CO2: 20 mmol/L — ABNORMAL LOW (ref 22–32)
Calcium: 7.5 mg/dL — ABNORMAL LOW (ref 8.9–10.3)
Chloride: 101 mmol/L (ref 98–111)
Creatinine, Ser: 3.37 mg/dL — ABNORMAL HIGH (ref 0.44–1.00)
GFR calc Af Amer: 16 mL/min — ABNORMAL LOW (ref >60)
GFR calc non Af Amer: 13 mL/min — ABNORMAL LOW (ref >60)
Glucose, Bld: 272 mg/dL — ABNORMAL HIGH (ref 70–99)
Potassium: 5.4 mmol/L — ABNORMAL HIGH (ref 3.5–5.1)
Sodium: 132 mmol/L — ABNORMAL LOW (ref 135–145)
Total Bilirubin: 0.8 mg/dL (ref 0.3–1.2)
Total Protein: 5.8 g/dL — ABNORMAL LOW (ref 6.5–8.1)

## 2019-06-29 MED ORDER — OXYCODONE HCL 5 MG PO TABS
5.0000 mg | ORAL_TABLET | ORAL | Status: DC | PRN
  Administered 2019-07-03: 5 mg via ORAL
  Filled 2019-06-29 (×2): qty 1

## 2019-06-29 MED ORDER — SODIUM POLYSTYRENE SULFONATE 15 GM/60ML PO SUSP
30.0000 g | Freq: Once | ORAL | Status: AC
  Administered 2019-06-29: 30 g via ORAL
  Filled 2019-06-29: qty 120

## 2019-06-29 NOTE — Progress Notes (Signed)
Inpatient Diabetes Program Recommendations  AACE/ADA: New Consensus Statement on Inpatient Glycemic Control (2015)  Target Ranges:  Prepandial:   less than 140 mg/dL      Peak postprandial:   less than 180 mg/dL (1-2 hours)      Critically ill patients:  140 - 180 mg/dL   Lab Results  Component Value Date   GLUCAP 278 (H) 06/29/2019   HGBA1C 7.0 (H) 05/31/2019    Review of Glycemic Control Results for Penny Martinez, Penny Martinez (MRN [id number]) as of 06/29/2019 12:04  Ref. Range 06/28/2019 07:49 06/28/2019 11:04 06/28/2019 15:58 06/28/2019 21:54 06/29/2019 07:31 06/29/2019 11:27  Glucose-Capillary Latest Ref Range: 70 - 99 mg/dL 199 (H) 198 (H) 248 (H) 285 (H) 254 (H) 278 (H)   Diabetes history: DM 2 Outpatient Diabetes medications: Humulin R U-500 concentrated insulin 60 units qam, 40 units at supper if premeal is >90, Victoza 1.8 mg Daily Current orders for Inpatient glycemic control:  Lantus 20 units bid Novolog 0-15 units tid  A1c 7% on 9/23  Inpatient Diabetes Program Recommendations:    Glucose trends elevated on current regimen. Glucose trends increase after meals.  -   Consider increasing Lantus to 22 units bid -   Consider Novolog meal coverage 3 units tid if pt eats at least 50% of meals.  Thanks,  Tama Headings RN, MSN, BC-ADM Inpatient Diabetes Coordinator Team Pager [phone] (8a-5p)

## 2019-06-29 NOTE — TOC Progression Note (Signed)
[place], [place] [address] [phone]   Add [place] My Favorites- [place] in a new window 5 out of 5 starsfootnote Much Above Average 5 out of 5 starsfootnote Much Above Average 3 out of 5 starsfootnote Average 3 out of 5 starsfootnote Average 2.9 [place], [place] [address] [phone]   [place] My Favorites- [place] in a new window 2 out of 5 starsfootnote Below Average 2 out of 5 starsfootnote Below Average 2 out of 5 starsfootnote Below Average 2 out of 5 starsfootnote Below Average 3.0 [place] [address] [place], [place] [address] [phone]   Add [facility] My Favorites- Opens in a new window 4 out of 5 starsfootnote Above Average 4 out of 5 starsfootnote Above Average 2 out of 5 starsfootnote Below Average 4 out of 5 starsfootnote Above Average 10.7 [place], [place] [address] [phone]   [place] [facility] My Favorites- Opens in a new window 3 out of 5 starsfootnote Average 3 out of 5 starsfootnote Average 2 out of 5 starsfootnote Below Average 3 out of 5 starsfootnote Average 12.5 [facility] [place], [place] [address] [phone]   Add [facility] My Favorites- [place] in a new window 1 out of 5 starsfootnote Much Below Average 2 out of 5 starsfootnote Below Average 1 out of 5 starsfootnote Much Below Average 3 out of 5 starsfootnote Average 14.7 [place] [address] [place] [address] [place], [place] [address] [phone]   Add [place] My Favorites- [place] in a new window 4 out of 5 starsfootnote Above Average 3 out of 5 starsfootnote Average 2 out of 5  starsfootnote Below Average 5 out of 5 starsfootnote Much Above Average 22.5 [facility] [address] [place], [place] [address] [phone]   Add [place] My Favorites- [place] in a new window 1 out of 5 starsfootnote Much Below Average 1 out of 5 starsfootnote Much Below Average 2 out of 5 starsfootnote Below Average 2 out of 5 starsfootnote Below Average 24.2 [place], [place] [address] [phone]   [place] [facility] My Favorites- [place] in a new window 3 out of 5 starsfootnote Average 3 out of 5 starsfootnote Average 2 out of 5 starsfootnote Below Average 2 out of 5 starsfootnote Below Average 24.4 [place] [address] [place], [place] [address] [phone]   [place] My Favorites- [place] in a new window 1 out of 5 starsfootnote Much Below Average 1 out of 5 starsfootnote Much Below Average 2 out of 5 starsfootnote Below Average 3 out of 5 starsfootnote Average 25.5 [place] [address] [place], [place] [address] [phone]   [place], [place] My Favorites- Opens in a new window 2 out of 5 starsfootnote Below Average 2 out of 5 starsfootnote Below Average 2 out of 5 starsfootnote Below Average 3 out of 5 starsfootnote Average 25.9 [place] [address] [place], [place] [address] [phone]   [place] My Favorites-  Opens in a new window 5 out of 5 starsfootnote Much Above Average 5 out of 5 starsfootnote Much Above Average 3 out of 5 starsfootnote Average 2 out of 5 starsfootnote Below Average 26.1 [facility] [address] [place] [address] [phone]   Add [facility] My Favorites- [place] in a new window 3 out of 5 starsfootnote Average 3 out of 5 starsfootnote Average 2 out of 5 starsfootnote Below Average 4 out of 5 starsfootnote Above Average 26.7 [place] [address] [place] [address] [phone]   [place] [facility] My Favorites- Opens in a new window 3 out of 5 starsfootnote Average 3 out of 5 starsfootnote Average 3 out of 5 starsfootnote Average 4 out of 5 starsfootnote Above Average 27.0 [place], [place] [address] [phone] [phone] [facility] My Favorites- Opens in a new window 2 out of 5 starsfootnote Below Average 2 out of 5 starsfootnote Below Average 2 out of 5 starsfootnote Below Average 2 out of 5 starsfootnote Below Average 27.0 [facility] [address] [place] [address] [phone]   [place] My Favorites- Opens in a new window 1 out of 5 starsfootnote Much Below Average 1 out of 5 starsfootnote Much Below Average 2 out of 5 starsfootnote Below Average 1 out of 5 starsfootnote Much Below Average 27.1 [place] [address] [phone] [place] My Favorites- Opens in a new window 3 out of 5 starsfootnote Average 3 out of 5 starsfootnote Average 2 out of 5 starsfootnote Below Average 4 out of 5 starsfootnote Above Average 27.8 [facility] [place], [place] [address] [phone]   Add [facility] My [place] in a new window 5 out of 5 starsfootnote Much Above Average 5 out of 5 starsfootnote Much Above Average 5 out of 5 starsfootnote Much Above  Average 5 out of 5 starsfootnote Much Above Average 28.0 [place], [place] [address] [phone]   Add [place] My Favorites- Opens in a new window 5 out of 5 starsfootnote Much Above Average 4 out of 5 starsfootnote Above Average 5 out of 5 starsfootnote Much Above Average 5 out of 5 starsfootnote Much Above Average 28.1 [place], [place] [address] [phone]   [place] My Favorites- Opens in a new window 5 out of 5 starsfootnote Much Above Average 4 out of 5 starsfootnote Above Average 5 out of 5 starsfootnote Much Above Average 5 out of 5 starsfootnote Much Above Average 28.2 [place], [place] [address] [phone]   Add [place] My Favorites- Opens in a new window 5 out of 5 starsfootnote Much Above Average 4 out of 5 starsfootnote Above Average 5 out of 5 starsfootnote Much Above Average 5 out of 5 starsfootnote Much Above Average 28.3 [place]

## 2019-06-29 NOTE — Progress Notes (Signed)
Patient refusing CPAP. Unit still in room.

## 2019-06-29 NOTE — Plan of Care (Signed)
  Problem: Acute Rehab PT Goals(only PT should resolve) Goal: Pt Will Go Supine/Side To Sit Outcome: Progressing Flowsheets (Taken 06/29/2019 1535) Pt will go Supine/Side to Sit: with minimal assist Goal: Patient Will Transfer Sit To/From Stand Outcome: Progressing Flowsheets (Taken 06/29/2019 1535) Patient will transfer sit to/from stand: with minimal assist Goal: Pt Will Transfer Bed To Chair/Chair To Bed Outcome: Progressing Flowsheets (Taken 06/29/2019 1535) Pt will Transfer Bed to Chair/Chair to Bed: with min assist Goal: Pt Will Ambulate Outcome: Progressing Flowsheets (Taken 06/29/2019 1535) Pt will Ambulate:  25 feet  with minimal assist  with moderate assist  with rolling walker   3:36 PM, 06/29/19 Lonell Grandchild, MPT Physical Therapist with [facility] 336 [phone] office [phone] mobile phone

## 2019-06-29 NOTE — Evaluation (Signed)
Physical Therapy Evaluation Patient Details Name: Penny Martinez MRN: [id number] DOB: [birth year] Today's Date: 06/29/2019   History of Present Illness  Penny Martinez is a 68 y.o. female with past medical history significant for gastroesophageal reflux disease, diastolic heart failure, NAS cirrhosis, morbid obesity, dyslipidemia, hypertension, type 2 diabetes mellitus with nephropathy, chronic kidney disease stage III, hypothyroidism and a sleep apnea; who presented to the emergency department secondary to confusion tremors/muscle spasm and not feeling well.  Patient expressed having her usual paracentesis approximately 1 week or so ago and since that procedure has been experiencing mild tremors or jerking movement in her arms or legs, which has worsening in the last 2 days prior to admission.  Patient also reports feeling somnolent, with decreased appetite and intermittently confused.  Patient denies any shortness of breath, cough, fever, chills, nausea, vomiting, chest pain, palpitations, dysuria, hematuria, melena, hematochezia or any other complaints.    Clinical Impression  Patient unable to use elbows for propping up to sitting, had to rolling to side and use side rail to sit up from side lying position with Mod/max assist, limited to a few side steps at bedside due to BLE weakness and poor standing balance.  Patient transferred to chair, then later requested to transfer to commode for a bowel movement - RN notified and patient left with call light in hand.  Patient will benefit from continued physical therapy in hospital and recommended venue below to increase strength, balance, endurance for safe ADLs and gait.    Follow Up Recommendations SNF;Supervision for mobility/OOB;Supervision/Assistance - 24 hour    Equipment Recommendations  None recommended by PT    Recommendations for Other Services       Precautions / Restrictions Precautions Precautions: Fall Restrictions Weight  Bearing Restrictions: No      Mobility  Bed Mobility Overal bed mobility: Needs Assistance Bed Mobility: Rolling;Sidelying to Sit Rolling: Min assist;Mod assist Sidelying to sit: Mod assist;Max assist       General bed mobility comments: slow labored movement  Transfers Overall transfer level: Needs assistance Equipment used: Rolling walker (2 wheeled);1 person hand held assist Transfers: Sit to/from [facility] Sit to Stand: Min assist;Mod assist Stand pivot transfers: Mod assist       General transfer comment: slow labored movement  Ambulation/Gait Ambulation/Gait assistance: Mod assist;Max assist Gait Distance (Feet): 5 Feet Assistive device: Rolling walker (2 wheeled) Gait Pattern/deviations: Decreased step length - right;Decreased step length - left;Decreased stride length Gait velocity: decreased   General Gait Details: limited ot 5-6 slow unsteady labored side steps with near loss of balance due to leaning backwards  Stairs            Wheelchair Mobility    Modified Rankin (Stroke Patients Only)       Balance Overall balance assessment: Needs assistance Sitting-balance support: Feet supported;No upper extremity supported Sitting balance-Leahy Scale: Fair Sitting balance - Comments: seated at bedside   Standing balance support: During functional activity;No upper extremity supported Standing balance-Leahy Scale: Poor Standing balance comment: fair/poor with RW                             Pertinent Vitals/Pain Pain Assessment: Faces Faces Pain Scale: Hurts a little bit Pain Location: buttocks Pain Descriptors / Indicators: Sore Pain Intervention(s): Limited activity within patient's tolerance;Monitored during session    Home Living Family/patient expects to be discharged to:: Private residence   Available Help at Discharge: Family;Available  24 hours/day Type of Home: House Home Access: Stairs to enter Entrance  Stairs-Rails: Right;Left;Can reach both Entrance Stairs-Number of Steps: 3-4 Home Layout: Laundry or work area in basement;One level Home Equipment: [place] - 4 wheels;Cane - single point      Prior Function Level of Independence: Independent with assistive device(s)         Comments: Sales executive with [employer]     Hand Dominance        Extremity/Trunk Assessment   Upper Extremity Assessment Upper Extremity Assessment: Generalized weakness    Lower Extremity Assessment Lower Extremity Assessment: Generalized weakness    Cervical / Trunk Assessment Cervical / Trunk Assessment: Normal  Communication   Communication: No difficulties  Cognition Arousal/Alertness: Awake/alert Behavior During Therapy: WFL for tasks assessed/performed Overall Cognitive Status: Within Functional Limits for tasks assessed                                        General Comments      Exercises     Assessment/Plan    PT Assessment Patient needs continued PT services  PT Problem List Decreased strength;Decreased activity tolerance;Decreased balance;Decreased mobility       PT Treatment Interventions Gait training;Stair training;Functional mobility training;Therapeutic activities;Therapeutic exercise;Patient/family education;Balance training    PT Goals (Current goals can be found in the Care Plan section)  Acute Rehab PT Goals Patient Stated Goal: return home with family to assist PT Goal Formulation: With patient Time For Goal Achievement: 07/13/19 Potential to Achieve Goals: Good    Frequency Min 3X/week   Barriers to discharge        Co-evaluation               AM-PAC PT "6 Clicks" Mobility  Outcome Measure Help needed turning from your back to your side while in a flat bed without using bedrails?: A Lot Help needed moving from lying on your back to sitting on the side of a flat bed without using bedrails?: A Lot Help needed moving to and from  a bed to a chair (including a wheelchair)?: A Lot Help needed standing up from a chair using your arms (e.g., wheelchair or bedside chair)?: A Lot Help needed to walk in hospital room?: A Lot Help needed climbing 3-5 steps with a railing? : Total 6 Click Score: 11    End of Session   Activity Tolerance: Patient tolerated treatment well;Patient limited by fatigue Patient left: with call bell/phone within reach(left sitting on [facility]) Nurse Communication: Mobility status PT Visit Diagnosis: Unsteadiness on feet (R26.81);Other abnormalities of gait and mobility (R26.89);Muscle weakness (generalized) (M62.81)    Time: 8325-4982 PT Time Calculation (min) (ACUTE ONLY): 32 min   Charges:   PT Evaluation $PT Eval Moderate Complexity: 1 Mod PT Treatments $Therapeutic Activity: 23-37 mins        3:34 PM, 06/29/19 Lonell Grandchild, MPT Physical Therapist with [facility] 336 [phone] office [phone] mobile phone

## 2019-06-29 NOTE — NC FL2 (Signed)
[place] LEVEL OF CARE SCREENING TOOL     IDENTIFICATION  Patient Name: Penny Martinez Birthdate: [birth year] Sex: female Admission Date (Current Location): 06/27/2019  [facility] and [place] Number:  [employer] and Address:  [facility] [address], [place]      Provider Number: [id number]  Attending Physician Name and Address:  Roxan Hockey, MD  Relative Name and Phone Number:  Tanaiya Kolarik - husband  [phone]    Current Level of Care: Hospital Recommended Level of Care: [place] Prior Approval Number:    Date Approved/Denied:   PASRR Number: [id number]  Discharge Plan: SNF    Current Diagnoses: Patient Active Problem List   Diagnosis Date Noted  . Hyperammonemia ([place]) 06/27/2019  . Acute on chronic renal failure ([place]) 06/27/2019  . CAP (community acquired pneumonia) 06/13/2019  . GERD (gastroesophageal reflux disease) 10/25/2018  . LUQ fullness 07/25/2018  . CKD stage 3 due to type 2 diabetes mellitus ([place]) 05/24/2018  . Hyponatremia syndrome 05/24/2018  . Other dysphagia 09/15/2017  . Uncontrolled type 2 diabetes mellitus with complication ([place]) 17/49/4496  . Mixed hyperlipidemia 04/27/2017  . Hyponatremia 04/27/2017  . Chronic diarrhea 12/15/2016  . Carpal tunnel syndrome, left   . Trigger middle finger of left hand   . Thrombocytopenia ([place]) 11/04/2016  . Iron deficiency anemia due to chronic blood loss 01/31/2016  . Other specified hypothyroidism 09/20/2015  . Essential hypertension, benign 07/12/2015  . Morbid obesity with BMI of 40.0-44.9, adult ([place]) 07/12/2015  . Hepatic cirrhosis ([place]) 04/02/2014  . Acute colitis 03/05/2014  . Abdominal pain, acute, left lower quadrant 03/05/2014  . Colitis 03/05/2014  . Splenomegaly, congestive, chronic 02/17/2014  . Sepsis ([place]) 02/15/2014  . Absolute anemia 02/15/2014  . Diabetes mellitus with stage 3 chronic kidney disease  ([place]) 02/15/2014  . CHF (congestive heart failure) ([place]) 02/15/2014  . CKD (chronic kidney disease) 02/15/2014  . UTI (lower urinary tract infection) 02/15/2014    Orientation RESPIRATION BLADDER Height & Weight     Self, Time, Situation, Place  Normal Continent Weight: 104.3 kg Height:  5' 1"  (154.9 cm)  BEHAVIORAL SYMPTOMS/MOOD NEUROLOGICAL BOWEL NUTRITION STATUS      Continent Diet(Heart Healthy Carb modified)  AMBULATORY STATUS COMMUNICATION OF NEEDS Skin   Extensive Assist Verbally Normal                       Personal Care Assistance Level of Assistance  Bathing, Feeding, Dressing Bathing Assistance: Limited assistance Feeding assistance: Limited assistance Dressing Assistance: Limited assistance     Functional Limitations Info  Sight, Hearing, Speech Sight Info: Adequate Hearing Info: Adequate Speech Info: Adequate    SPECIAL CARE FACTORS FREQUENCY  PT (By licensed PT)     PT Frequency: PT 5 times a week              Contractures Contractures Info: Not present    Additional Factors Info  Code Status, Allergies Code Status Info: FULL Allergies Info: Niacin, Erythromycin, PCN, Sulfa, Cipro, Statin, Codiene, Fluticasone, Tramadol           Current Medications (06/29/2019):  This is the current hospital active medication list Current Facility-Administered Medications  Medication Dose Route Frequency Provider Last Rate Last Dose  . 0.9 %  sodium chloride infusion   Intravenous Continuous Roxan Hockey, MD 50 mL/hr at 06/28/19 1841    . amitriptyline (ELAVIL) tablet 25 mg  25 mg Oral QHS [place],  Clifton James, MD   25 mg at 06/28/19 2204  . aspirin EC tablet 81 mg  81 mg Oral Q breakfast Barton Dubois, MD   81 mg at 06/29/19 0813  . azelastine (ASTELIN) 0.1 % nasal spray 1 spray  1 spray Each Nare PRN Barton Dubois, MD      . Chlorhexidine Gluconate Cloth 2 % PADS 6 each  6 each Topical Daily Barton Dubois, MD   6 each at 06/29/19 [phone]  . donepezil  (ARICEPT) tablet 5 mg  5 mg Oral QHS Barton Dubois, MD   5 mg at 06/28/19 2203  . ezetimibe (ZETIA) tablet 10 mg  10 mg Oral q morning - 10a Barton Dubois, MD   10 mg at 06/29/19 8984  . ferrous sulfate tablet 325 mg  325 mg Oral Daily Barton Dubois, MD   325 mg at 06/29/19 0813  . gabapentin (NEURONTIN) capsule 100 mg  100 mg Oral TID Barton Dubois, MD   100 mg at 06/29/19 1440  . heparin injection 5,000 Units  5,000 Units Subcutaneous Q8H Roxan Hockey, MD   5,000 Units at 06/29/19 1255  . insulin aspart (novoLOG) injection 0-15 Units  0-15 Units Subcutaneous TID WC Barton Dubois, MD   8 Units at 06/29/19 1149  . insulin glargine (LANTUS) injection 20 Units  20 Units Subcutaneous BID Barton Dubois, MD   20 Units at 06/29/19 1119  . lactulose (CHRONULAC) 10 GM/15ML solution 30 g  30 g Oral BID Barton Dubois, MD   30 g at 06/29/19 0813  . levothyroxine (SYNTHROID) tablet 100 mcg  100 mcg Oral Daily Barton Dubois, MD   100 mcg at 06/29/19 0604  . ondansetron (ZOFRAN) tablet 4 mg  4 mg Oral Q6H PRN Barton Dubois, MD       Or  . ondansetron [facility]) injection 4 mg  4 mg Intravenous Q6H PRN Barton Dubois, MD      . oxyCODONE (Oxy IR/ROXICODONE) immediate release tablet 5 mg  5 mg Oral Q4H PRN Emokpae, Courage, MD      . pantoprazole (PROTONIX) EC tablet 40 mg  40 mg Oral Daily Barton Dubois, MD   40 mg at 06/29/19 2103     Discharge Medications: Please see discharge summary for a list of discharge medications.  Relevant Imaging Results:  Relevant Lab Results:   Additional Information SS# [id number]  Boneta Lucks, RN

## 2019-06-29 NOTE — Progress Notes (Signed)
Patient Demographics:    Penny Martinez, is a 68 y.o. female, DOB - July 28, 1951, GMW:[id number]  Admit date - 06/27/2019   Admitting Physician Vassie Loll, MD  Outpatient Primary MD for the patient is [name], MD  LOS - 2   Chief Complaint  Patient presents with  . Tremors        Subjective:    Penny Martinez today has no fevers, no emesis,  No chest pain,  --- less confused, more awake -Husband at bedside, questions answered   Assessment  & Plan :    Principal Problem:   Acute on chronic renal failure (HCC) Active Problems:   Diabetes mellitus with stage 3 chronic kidney disease (HCC)   Hepatic cirrhosis (HCC)   Essential hypertension, benign   Morbid obesity with BMI of 40.0-44.9, adult (HCC)   Other specified hypothyroidism   Mixed hyperlipidemia   GERD (gastroesophageal reflux disease)   Hyperammonemia (HCC)  Brief summary 68 y.o. female with past medical history significant for gastroesophageal reflux disease, diastolic heart failure, NAS cirrhosis, morbid obesity, dyslipidemia, hypertension, type 2 diabetes mellitus with nephropathy, chronic kidney disease stage III, hypothyroidism and a sleep apnea  A/p 1) hepatic encephalopathy in the setting of underlying [place] liver cirrhosis--on admission ammonia was 105, down to 84, continue lactulose -Clinically improving  2)AKI----acute kidney injury on CKD stage -hyperkalemia noted, worsening renal function due to decreased p.o. intake in the setting of hepatic encephalopathy compounded by benazepril and torsemide use   creatinine on admission=3.86  ,   baseline creatinine =1.4    , creatinine is now= 3.37    , renally adjust medications, avoid nephrotoxic agents/dehydration/hypotension -Hold benazepril and torsemide -Give Kayexalate given persistent hyperkalemia with potassium of 6.1 (was 6.0)  3)DM2-with nephropathy and  neuropathy--continue insulin and sliding scale coverage  4)GERD-continue Protonix  5)Hypothyroidism-continue levothyroxine  Disposition/Need for in-Hospital Stay- patient unable to be discharged at this time due to -worsening renal function and persistent hyperkalemia as well as hepatic encephalopathy requiring lactulose with risk for further dehydration and worsening renal failure  Code Status : Full  Family Communication:    (patient is alert, awake and coherent) Discussed with patient's husband  Disposition Plan  : Anticipate discharge home with home health services Vs SNF Rehab  Consults  :  NA  DVT Prophylaxis  :  - Heparin - SCDs  Lab Results  Component Value Date   PLT 181 06/29/2019    Inpatient Medications  Scheduled Meds: . amitriptyline  25 mg Oral QHS  . aspirin EC  81 mg Oral Q breakfast  . Chlorhexidine Gluconate Cloth  6 each Topical Daily  . donepezil  5 mg Oral QHS  . ezetimibe  10 mg Oral q morning - 10a  . ferrous sulfate  325 mg Oral Daily  . gabapentin  100 mg Oral TID  . heparin injection (subcutaneous)  5,000 Units Subcutaneous Q8H  . insulin aspart  0-15 Units Subcutaneous TID WC  . insulin glargine  20 Units Subcutaneous BID  . lactulose  30 g Oral BID  . levothyroxine  100 mcg Oral Daily  . pantoprazole  40 mg Oral Daily   Continuous Infusions: . sodium chloride 50 mL/hr at 06/28/19 1841   PRN Meds:.azelastine, ondansetron **  OR** ondansetron (ZOFRAN) IV, oxyCODONE    Anti-infectives (From admission, onward)   None        Objective:   Vitals:   06/28/19 1300 06/28/19 2159 06/29/19 0509 06/29/19 1127  BP: 99/66 (!) 115/35 (!) 99/39 (!) 134/117  Pulse: 93 92 93 (!) 142  Resp: 18 20 20    Temp: 97.6 F (36.4 C) (!) 97.3 F (36.3 C) 97.8 F (36.6 C) 97.9 F (36.6 C)  TempSrc: Oral Oral Oral Oral  SpO2: 96% 98% 97% 96%  Weight:      Height:        Wt Readings from Last 3 Encounters:  06/27/19 104.3 kg  06/15/19 106.1 kg   05/18/19 102.5 kg     Intake/Output Summary (Last 24 hours) at 06/29/2019 1201 Last data filed at 06/29/2019 0500 Gross per 24 hour  Intake 693.45 ml  Output 650 ml  Net 43.45 ml     Physical Exam  Gen:-More awake and more alert HEENT:- [place].AT, No sclera icterus Neck-Supple Neck,No JVD,.  Lungs-mostly clear, fair symmetrical air movement CV- S1, S2 normal, regular  Abd-  +ve B.Sounds, Abd Soft, No tenderness,    Extremity/Skin:- No  edema, pedal pulses present  Psych-affect is appropriate, oriented x3 Neuro-generalized weakness without new focal deficits,  no tremors GU-Foley with urine that is more clear  Data Review:   Micro Results Recent Results (from the past 240 hour(s))  Urine Culture     Status: None   Collection Time: 06/27/19  2:39 PM   Specimen: Urine, Clean Catch  Result Value Ref Range Status   Specimen Description   Final    URINE, CLEAN CATCH Performed at [facility], [address], [place], [place] [address]    Special Requests   Final    NONE Performed at [facility], [address] [place], [place] [address]    Culture   Final    NO GROWTH Performed at [facility], [address] [place], [place] [address]    Report Status 06/28/2019 FINAL  Final  SARS CORONAVIRUS 2 (TAT 6-24 HRS) Nasopharyngeal Nasopharyngeal Swab     Status: None   Collection Time: 06/27/19  4:33 PM   Specimen: Nasopharyngeal Swab  Result Value Ref Range Status   SARS Coronavirus 2 NEGATIVE NEGATIVE Final    Comment: (NOTE) SARS-CoV-2 target nucleic acids are NOT DETECTED. The SARS-CoV-2 RNA is generally detectable in upper and lower respiratory specimens during the acute phase of infection. Negative results do not preclude SARS-CoV-2 infection, do not rule out co-infections with other pathogens, and should not be used as the sole basis for treatment or other patient management decisions. Negative results must be combined with clinical observations, patient  history, and epidemiological information. The expected result is Negative. Fact Sheet for Patients: HairSlick.no Fact Sheet for Healthcare Providers: quierodirigir.com This test is not yet approved or cleared by the [place] FDA and  has been authorized for detection and/or diagnosis of SARS-CoV-2 by FDA under an Emergency Use Authorization (EUA). This EUA will remain  in effect (meaning this test can be used) for the duration of the COVID-19 declaration under Section 56 4(b)(1) of the Act, 21 U.S.C. section 360bbb-3(b)(1), unless the authorization is terminated or revoked sooner. Performed at [facility], [address] [place], [place] [address]     Radiology Reports US Paracentesis  Result Date: 06/21/2019 INDICATION: Ascites EXAM: ULTRASOUND GUIDED  PARACENTESIS MEDICATIONS: None. COMPLICATIONS: None immediate. PROCEDURE: Informed written consent  was obtained from the patient after a discussion of the risks, benefits and alternatives to treatment. A timeout was performed prior to the initiation of the procedure. Initial ultrasound scanning demonstrates a large amount of ascites within the right lower abdominal quadrant. The right lower abdomen was prepped and draped in the usual sterile fashion. 1% lidocaine was used for local anesthesia. Following this, a 19 gauge, 7-cm, Yueh catheter was introduced. An ultrasound image was saved for documentation purposes. The paracentesis was performed. The catheter was removed and a dressing was applied. The patient tolerated the procedure well without immediate post procedural complication. Patient received post-procedure intravenous albumin; see nursing notes for details. FINDINGS: A total of approximately 7.2 liters of clear yellow fluid was removed. Samples were sent to the laboratory as requested by the clinical team. IMPRESSION: Successful ultrasound-guided paracentesis yielding  7.2 liters of peritoneal fluid. Electronically Signed   By: Charlett Nose M.D.   On: 06/21/2019 15:07   US Paracentesis  Result Date: 05/31/2019 INDICATION: Recurrent ascites EXAM: ULTRASOUND GUIDED Left PARACENTESIS MEDICATIONS: 10 cc 1% lidocaine COMPLICATIONS: None immediate. PROCEDURE: Informed written consent was obtained from the patient after a discussion of the risks, benefits and alternatives to treatment. A timeout was performed prior to the initiation of the procedure. Initial ultrasound scanning demonstrates a large amount of ascites within the left lower abdominal quadrant. The left lower abdomen was prepped and draped in the usual sterile fashion. 1% lidocaine was used for local anesthesia. Following this, a 19 gauge Yueh catheter was introduced. An ultrasound image was saved for documentation purposes. The paracentesis was performed. The catheter was removed and a dressing was applied. The patient tolerated the procedure well without immediate post procedural complication. Patient did not receive post-procedure intravenous albumin per MD. FINDINGS: A total of approximately 3.6 of yellow fluid was removed. Samples were sent to the laboratory as requested by the clinical team. IMPRESSION: Successful ultrasound-guided paracentesis yielding 3.6 liters of peritoneal fluid. Read by Robet Leu [facility] Electronically Signed   By: Ulyses Southward M.D.   On: 05/31/2019 13:43   Dg Chest Port 1 View  Result Date: 06/13/2019 CLINICAL DATA:  Cough and dyspnea EXAM: PORTABLE CHEST 1 VIEW COMPARISON:  05/23/2018 chest radiograph. FINDINGS: Stable cardiomediastinal silhouette with top-normal heart size. No pneumothorax. Small left pleural effusion. No right pleural effusion. Patchy left lung base consolidation. No pulmonary edema. IMPRESSION: 1. Patchy left lung base consolidation suspicious for pneumonia. Recommend follow-up PA and lateral post treatment chest radiographs in 4-6 weeks. 2. Small left pleural  effusion. Electronically Signed   By: Delbert Phenix M.D.   On: 06/13/2019 20:45     CBC Recent Labs  Lab 06/27/19 1334 06/29/19 0548  WBC 8.7 6.6  HGB 12.1 11.2*  HCT 40.7 36.9  PLT 183 181  MCV 86.0 86.0  MCH 25.6* 26.1  MCHC 29.7* 30.4  RDW 23.9* 23.9*  LYMPHSABS 0.9  --   MONOABS 0.8  --   EOSABS 0.1  --   BASOSABS 0.0  --     Chemistries  Recent Labs  Lab 06/27/19 1334 06/28/19 0855 06/29/19 0548  NA 129* 133* 132*  K 6.0* 6.1* 5.4*  CL 96* 100 101  CO2 21* 18* 20*  GLUCOSE 150* 185* 272*  BUN 89* 90* 91*  CREATININE 3.86* 3.48* 3.37*  CALCIUM 8.3* 8.3* 7.5*  MG 2.9*  2.8*  --   --   AST 39 50* 34  ALT 22 24 20   ALKPHOS 109  131* 117  BILITOT 0.7 1.2 0.8   ------------------------------------------------------------------------------------------------------------------ No results for input(s): CHOL, HDL, LDLCALC, TRIG, CHOLHDL, LDLDIRECT in the last 72 hours.  Lab Results  Component Value Date   HGBA1C 7.0 (H) 05/31/2019   ------------------------------------------------------------------------------------------------------------------ Recent Labs    06/27/19 1334  TSH 2.993   ------------------------------------------------------------------------------------------------------------------ No results for input(s): VITAMINB12, FOLATE, FERRITIN, TIBC, IRON, RETICCTPCT in the last 72 hours.  Coagulation profile No results for input(s): INR, PROTIME in the last 168 hours.  No results for input(s): DDIMER in the last 72 hours.  Cardiac Enzymes No results for input(s): CKMB, TROPONINI, MYOGLOBIN in the last 168 hours.  Invalid input(s): CK ------------------------------------------------------------------------------------------------------------------    Component Value Date/Time   BNP 34.0 06/13/2019 1926    Vianka Ertel M.D on 06/29/2019 at 12:01 PM  Go to [email or web address] - for contact info  [facility] - Office  [phone]

## 2019-06-29 NOTE — TOC Progression Note (Signed)
Transition of Care [facility]) - Progression Note    Patient Details  Name: Penny Martinez MRN: [id number] Date of Birth: 1951-08-24  Transition of Care [facility]) CM/SW Contact  Boneta Lucks, RN Phone Number: 06/29/2019, 4:02 PM  Clinical Narrative:   PT recommending SNF, Spoke with Gwyndolyn Saxon - husband, states he discussed with patient and family and they all agree. FL2 done and sent out.  Choices given , first choice would be [facility]. TOC to follow.    Expected Discharge Plan: [place] Barriers to Discharge: Continued Medical Work up  Expected Discharge Plan and Services Expected Discharge Plan: [place] In-house Referral: Clinical Social Work     Living arrangements for the past 2 months: Single Family Home     Social Determinants of Health (SDOH) Interventions    Readmission Risk Interventions Readmission Risk Prevention Plan 06/28/2019 06/15/2019 06/14/2019  Transportation Screening Complete - Complete  PCP or Specialist Appt within 3-5 Days - Complete -  HRI or Home Care Consult Complete Complete Complete  Social Work Consult for [place] Planning/Counseling Complete - Complete  Palliative Care Screening Not Applicable - Not Applicable  Medication Review Press photographer) Complete - Complete  Some recent data might be hidden

## 2019-06-30 DIAGNOSIS — N189 Chronic kidney disease, unspecified: Secondary | ICD-10-CM

## 2019-06-30 DIAGNOSIS — E1122 Type 2 diabetes mellitus with diabetic chronic kidney disease: Secondary | ICD-10-CM

## 2019-06-30 DIAGNOSIS — N183 Chronic kidney disease, stage 3 unspecified: Secondary | ICD-10-CM

## 2019-06-30 LAB — BASIC METABOLIC PANEL
Anion gap: 13 (ref 5–15)
BUN: 88 mg/dL — ABNORMAL HIGH (ref 8–23)
CO2: 19 mmol/L — ABNORMAL LOW (ref 22–32)
Calcium: 7.9 mg/dL — ABNORMAL LOW (ref 8.9–10.3)
Chloride: 104 mmol/L (ref 98–111)
Creatinine, Ser: 2.93 mg/dL — ABNORMAL HIGH (ref 0.44–1.00)
GFR calc Af Amer: 18 mL/min — ABNORMAL LOW (ref >60)
GFR calc non Af Amer: 16 mL/min — ABNORMAL LOW (ref >60)
Glucose, Bld: 219 mg/dL — ABNORMAL HIGH (ref 70–99)
Potassium: 4.4 mmol/L (ref 3.5–5.1)
Sodium: 136 mmol/L (ref 135–145)

## 2019-06-30 LAB — GLUCOSE, CAPILLARY
Glucose-Capillary: 195 mg/dL — ABNORMAL HIGH (ref 70–99)
Glucose-Capillary: 266 mg/dL — ABNORMAL HIGH (ref 70–99)
Glucose-Capillary: 315 mg/dL — ABNORMAL HIGH (ref 70–99)
Glucose-Capillary: 239 mg/dL — ABNORMAL HIGH (ref 70–99)

## 2019-06-30 MED ORDER — ZINC OXIDE 40 % EX OINT
TOPICAL_OINTMENT | Freq: Two times a day (BID) | CUTANEOUS | Status: DC
  Administered 2019-06-30 – 2019-07-01 (×2): via TOPICAL
  Administered 2019-07-01: 1 via TOPICAL
  Administered 2019-07-01 – 2019-07-02 (×3): via TOPICAL
  Administered 2019-07-03: 1 via TOPICAL
  Administered 2019-07-03 – 2019-07-04 (×2): via TOPICAL
  Filled 2019-06-30: qty 57

## 2019-06-30 MED ORDER — INSULIN GLARGINE 100 UNIT/ML ~~LOC~~ SOLN
22.0000 [IU] | Freq: Two times a day (BID) | SUBCUTANEOUS | Status: DC
  Administered 2019-06-30 – 2019-07-02 (×4): 22 [IU] via SUBCUTANEOUS
  Filled 2019-06-30 (×6): qty 0.22

## 2019-06-30 NOTE — Progress Notes (Signed)
Patient still refusing CPAP. No unit in room.

## 2019-06-30 NOTE — Progress Notes (Signed)
Inpatient Diabetes Program Recommendations  AACE/ADA: New Consensus Statement on Inpatient Glycemic Control (2015)  Target Ranges:  Prepandial:   less than 140 mg/dL      Peak postprandial:   less than 180 mg/dL (1-2 hours)      Critically ill patients:  140 - 180 mg/dL   Lab Results  Component Value Date   GLUCAP 266 (H) 06/30/2019   HGBA1C 7.0 (H) 05/31/2019    Review of Glycemic Control Results for Penny Martinez, Penny Martinez (MRN [id number]) as of 06/30/2019 11:59  Ref. Range 06/29/2019 11:27 06/29/2019 16:48 06/29/2019 20:44 06/30/2019 07:22 06/30/2019 11:02  Glucose-Capillary Latest Ref Range: 70 - 99 mg/dL 278 (H) 245 (H) 314 (H) 195 (H) 266 (H)   Diabetes history: DM 2 Outpatient Diabetes medications: Humulin R U-500 concentrated insulin 60 units qam, 40 units at supper if premeal is >90, Victoza 1.8 mg Daily Current orders for Inpatient glycemic control:  Lantus 20 units bid Novolog 0-15 units tid  Inpatient Diabetes Program Recommendations:   - Consider Novolog meal coverage 4 units tid if pt eats at least 50% of meals. Secure chat to Dr. Denton Brick with recommendations.  Thank you, Nani Gasser. Parish Augustine, RN, MSN, CDE  Diabetes Coordinator Inpatient Glycemic Control Team Team Pager [phone] (8am-5pm) 06/30/2019 12:00 PM

## 2019-06-30 NOTE — Care Management Important Message (Signed)
Important Message  Patient Details  Name: Penny Martinez MRN: [id number] Date of Birth: November 04, 1950   Medicare Important Message Given:  Yes     Tommy Medal 06/30/2019, 12:09 PM

## 2019-06-30 NOTE — Plan of Care (Signed)

## 2019-06-30 NOTE — TOC Progression Note (Signed)
Transition of Care [facility]) - Progression Note    Patient Details  Name: Penny Martinez MRN: [id number] Date of Birth: September 08, 1950  Transition of Care [facility]) CM/SW Contact  Boneta Lucks, RN Phone Number: 06/30/2019, 2:28 PM  Clinical Narrative:   [facility] made a bed offer. Plan is for patient to go on Monday. Husband, RN, MD updated.     Expected Discharge Plan: [place] Barriers to Discharge: Continued Medical Work up  Expected Discharge Plan and Services Expected Discharge Plan: [place] In-house Referral: Clinical Social Work     Living arrangements for the past 2 months: Single Family Home                  Social Determinants of Health (SDOH) Interventions    Readmission Risk Interventions Readmission Risk Prevention Plan 06/28/2019 06/15/2019 06/14/2019  Transportation Screening Complete - Complete  PCP or Specialist Appt within 3-5 Days - Complete -  HRI or Home Care Consult Complete Complete Complete  Social Work Consult for [place] Planning/Counseling Complete - Complete  Palliative Care Screening Not Applicable - Not Applicable  Medication Review Press photographer) Complete - Complete  Some recent data might be hidden

## 2019-06-30 NOTE — Progress Notes (Signed)
Patient Demographics:    Penny Martinez, is a 68 y.o. female, DOB - [birth year], UEA:[id number]  Admit date - 06/27/2019   Admitting Physician Vassie Loll, MD  Outpatient Primary MD for the patient is [name], MD  LOS - 3   Chief Complaint  Patient presents with  . Tremors        Subjective:    Penny Martinez today has no fevers, no emesis,  No chest pain,  --- less confused, more awake - -No BM -   Assessment  & Plan :    Principal Problem:   Acute on chronic renal failure (HCC) Active Problems:   Diabetes mellitus with stage 3 chronic kidney disease (HCC)   Hepatic cirrhosis (HCC)   Essential hypertension, benign   Morbid obesity with BMI of 40.0-44.9, adult (HCC)   Other specified hypothyroidism   Mixed hyperlipidemia   GERD (gastroesophageal reflux disease)   Hyperammonemia (HCC)  Brief summary 68 y.o. female with past medical history significant for gastroesophageal reflux disease, diastolic heart failure, NAS cirrhosis, morbid obesity, dyslipidemia, hypertension, type 2 diabetes mellitus with nephropathy, chronic kidney disease stage III, hypothyroidism and a sleep apnea  A/p 1)Hepatic Encephalopathy in the setting of underlying [place] liver cirrhosis--on admission ammonia was 105, down to 84, continue lactulose -Her mentation appears back to baseline -Consider increasing lactulose if no BM over the next 24 hours  2)AKI----acute kidney injury on CKD stage -hyperkalemia noted, worsening renal function due to decreased p.o. intake in the setting of hepatic encephalopathy compounded by benazepril and torsemide use   creatinine on admission=3.86  ,   baseline creatinine =1.4    , creatinine is now= 2.93    , renally adjust medications, avoid nephrotoxic agents/dehydration/hypotension -Hold benazepril and torsemide -Potassium is down to 4.4 after 2 doses of Kayexalate over a  couple of days  3)DM2-with nephropathy and neuropathy---Recent A1c 7.0 reflecting good diabetic control PTA -Hypoglycemia persist at this time so Increase Lantus insulin to 22 units twice daily from 20 units twice daily,  Use Novolog/Humalog Sliding scale insulin with Accu-Cheks/Fingersticks as ordered   4)GERD-continue Protonix  5)Hypothyroidism-continue levothyroxine  6) generalized weakness/deconditioning--- physical therapy evaluation appreciated, recommend SNF rehab, patient and family agreeable to SNF rehab -Discussed with patient, patient's husband and patient's daughter Ms Nicolasa Ducking  Disposition/Need for in-Hospital Stay- patient unable to be discharged at this time due to -worsening renal function and persistent hyperkalemia as well as hepatic encephalopathy requiring lactulose with risk for further dehydration and worsening renal failure  Code Status : Full  Family Communication:    (patient is alert, awake and coherent) Discussed with patient's husband -Also discussed with patient's daughter Ms. Nicolasa Ducking-- [phone]  Disposition Plan  : Anticipate discharge  to SNF Rehab  Consults  :  NA  DVT Prophylaxis  :  - Heparin - SCDs  Lab Results  Component Value Date   PLT 181 06/29/2019    Inpatient Medications  Scheduled Meds: . amitriptyline  25 mg Oral QHS  . aspirin EC  81 mg Oral Q breakfast  . Chlorhexidine Gluconate Cloth  6 each Topical Daily  . donepezil  5 mg Oral QHS  . ezetimibe  10 mg Oral q morning - 10a  . ferrous sulfate  325 mg Oral Daily  . gabapentin  100 mg Oral TID  . heparin injection (subcutaneous)  5,000 Units Subcutaneous Q8H  . insulin aspart  0-15 Units Subcutaneous TID WC  . insulin glargine  20 Units Subcutaneous BID  . lactulose  30 g Oral BID  . levothyroxine  100 mcg Oral Daily  . liver oil-zinc oxide   Topical BID  . pantoprazole  40 mg Oral Daily   Continuous Infusions: . sodium chloride 50 mL/hr at 06/28/19 1841    PRN Meds:.azelastine, ondansetron **OR** ondansetron (ZOFRAN) IV, oxyCODONE    Anti-infectives (From admission, onward)   None        Objective:   Vitals:   06/29/19 2147 06/30/19 0500 06/30/19 0507 06/30/19 1335  BP: 111/63  (!) 126/55 (!) 111/40  Pulse: 98  100 98  Resp: 18  17 18   Temp: 97.8 F (36.6 C)  98.1 F (36.7 C) 97.6 F (36.4 C)  TempSrc:    Oral  SpO2: 96%  94% 96%  Weight:  111 kg    Height:        Wt Readings from Last 3 Encounters:  06/30/19 111 kg  06/15/19 106.1 kg  05/18/19 102.5 kg     Intake/Output Summary (Last 24 hours) at 06/30/2019 1751 Last data filed at 06/30/2019 1354 Gross per 24 hour  Intake 1083.14 ml  Output 1100 ml  Net -16.86 ml     Physical Exam  Gen:-More awake and more alert HEENT:- [place].AT, No sclera icterus Neck-Supple Neck,No JVD,.  Lungs-mostly clear, fair symmetrical air movement CV- S1, S2 normal, regular  Abd-  +ve B.Sounds, Abd Soft, No tenderness,    Extremity/Skin:- No  edema, pedal pulses present  Psych-affect is appropriate, oriented x3 Neuro-generalized weakness without new focal deficits,  no tremors GU-Foley with urine that is more clear  Data Review:   Micro Results Recent Results (from the past 240 hour(s))  Urine Culture     Status: None   Collection Time: 06/27/19  2:39 PM   Specimen: Urine, Clean Catch  Result Value Ref Range Status   Specimen Description   Final    URINE, CLEAN CATCH Performed at [facility], [address], [place], [place] [address]    Special Requests   Final    NONE Performed at [facility], [address], [place], [place] [address]    Culture   Final    NO GROWTH Performed at [facility], [address] [place], [place] [address]    Report Status 06/28/2019 FINAL  Final  SARS CORONAVIRUS 2 (TAT 6-24 HRS) Nasopharyngeal Nasopharyngeal Swab     Status: None   Collection Time: 06/27/19  4:33 PM   Specimen: Nasopharyngeal Swab  Result Value Ref Range Status    SARS Coronavirus 2 NEGATIVE NEGATIVE Final    Comment: (NOTE) SARS-CoV-2 target nucleic acids are NOT DETECTED. The SARS-CoV-2 RNA is generally detectable in upper and lower respiratory specimens during the acute phase of infection. Negative results do not preclude SARS-CoV-2 infection, do not rule out co-infections with other pathogens, and should not be used as the sole basis for treatment or other patient management decisions. Negative results must be combined with clinical observations, patient history, and epidemiological information. The expected result is Negative. Fact Sheet for Patients: HairSlick.no Fact Sheet for Healthcare Providers: quierodirigir.com This test is not yet approved or cleared by the [place] FDA and  has been authorized for detection and/or diagnosis of SARS-CoV-2 by FDA  under an Emergency Use Authorization (EUA). This EUA will remain  in effect (meaning this test can be used) for the duration of the COVID-19 declaration under Section 56 4(b)(1) of the Act, 21 U.S.C. section 360bbb-3(b)(1), unless the authorization is terminated or revoked sooner. Performed at [facility], [address], [place], [place] [address]     Radiology Reports US Paracentesis  Result Date: 06/21/2019 INDICATION: Ascites EXAM: ULTRASOUND GUIDED  PARACENTESIS MEDICATIONS: None. COMPLICATIONS: None immediate. PROCEDURE: Informed written consent was obtained from the patient after a discussion of the risks, benefits and alternatives to treatment. A timeout was performed prior to the initiation of the procedure. Initial ultrasound scanning demonstrates a large amount of ascites within the right lower abdominal quadrant. The right lower abdomen was prepped and draped in the usual sterile fashion. 1% lidocaine was used for local anesthesia. Following this, a 19 gauge, 7-cm, Yueh catheter was introduced. An ultrasound  image was saved for documentation purposes. The paracentesis was performed. The catheter was removed and a dressing was applied. The patient tolerated the procedure well without immediate post procedural complication. Patient received post-procedure intravenous albumin; see nursing notes for details. FINDINGS: A total of approximately 7.2 liters of clear yellow fluid was removed. Samples were sent to the laboratory as requested by the clinical team. IMPRESSION: Successful ultrasound-guided paracentesis yielding 7.2 liters of peritoneal fluid. Electronically Signed   By: Charlett Nose M.D.   On: 06/21/2019 15:07   Dg Chest Port 1 View  Result Date: 06/13/2019 CLINICAL DATA:  Cough and dyspnea EXAM: PORTABLE CHEST 1 VIEW COMPARISON:  05/23/2018 chest radiograph. FINDINGS: Stable cardiomediastinal silhouette with top-normal heart size. No pneumothorax. Small left pleural effusion. No right pleural effusion. Patchy left lung base consolidation. No pulmonary edema. IMPRESSION: 1. Patchy left lung base consolidation suspicious for pneumonia. Recommend follow-up PA and lateral post treatment chest radiographs in 4-6 weeks. 2. Small left pleural effusion. Electronically Signed   By: Delbert Phenix M.D.   On: 06/13/2019 20:45     CBC Recent Labs  Lab 06/27/19 1334 06/29/19 0548  WBC 8.7 6.6  HGB 12.1 11.2*  HCT 40.7 36.9  PLT 183 181  MCV 86.0 86.0  MCH 25.6* 26.1  MCHC 29.7* 30.4  RDW 23.9* 23.9*  LYMPHSABS 0.9  --   MONOABS 0.8  --   EOSABS 0.1  --   BASOSABS 0.0  --     Chemistries  Recent Labs  Lab 06/27/19 1334 06/28/19 0855 06/29/19 0548 06/30/19 0603  NA 129* 133* 132* 136  K 6.0* 6.1* 5.4* 4.4  CL 96* 100 101 104  CO2 21* 18* 20* 19*  GLUCOSE 150* 185* 272* 219*  BUN 89* 90* 91* 88*  CREATININE 3.86* 3.48* 3.37* 2.93*  CALCIUM 8.3* 8.3* 7.5* 7.9*  MG 2.9*  2.8*  --   --   --   AST 39 50* 34  --   ALT 22 24 20   --   ALKPHOS 109 131* 117  --   BILITOT 0.7 1.2 0.8  --     ------------------------------------------------------------------------------------------------------------------ No results for input(s): CHOL, HDL, LDLCALC, TRIG, CHOLHDL, LDLDIRECT in the last 72 hours.  Lab Results  Component Value Date   HGBA1C 7.0 (H) 05/31/2019   ------------------------------------------------------------------------------------------------------------------ No results for input(s): TSH, T4TOTAL, T3FREE, THYROIDAB in the last 72 hours.  Invalid input(s): FREET3 ------------------------------------------------------------------------------------------------------------------ No results for input(s): VITAMINB12, FOLATE, FERRITIN, TIBC, IRON, RETICCTPCT in the last 72 hours.  Coagulation profile No results for input(s): INR,  PROTIME in the last 168 hours.  No results for input(s): DDIMER in the last 72 hours.  Cardiac Enzymes No results for input(s): CKMB, TROPONINI, MYOGLOBIN in the last 168 hours.  Invalid input(s): CK ------------------------------------------------------------------------------------------------------------------    Component Value Date/Time   BNP 34.0 06/13/2019 1926    Lyssa Hackley M.D on 06/30/2019 at 5:51 PM  Go to [email or web address] - for contact info  [facility] - Office  [phone]

## 2019-06-30 NOTE — TOC Progression Note (Signed)
Transition of Care [facility]) - Progression Note    Patient Details  Name: Penny Martinez MRN: [id number] Date of Birth: October 23, 1950  Transition of Care [facility]) CM/SW Contact  Boneta Lucks, RN Phone Number: 06/30/2019, 3:31 PM  Clinical Narrative:   Daughter- Dennis Bast called, states her father does not hear well and is not understanding the plan for his wife.  Family wants CM to reach out for other bed offers.  Referrals sent, Will discuss with Santiago Glad on Monday.     Expected Discharge Plan: [place] Barriers to Discharge: Continued Medical Work up  Expected Discharge Plan and Services Expected Discharge Plan: [place] In-house Referral: Clinical Social Work     Living arrangements for the past 2 months: Single Family Home                  Social Determinants of Health (SDOH) Interventions    Readmission Risk Interventions Readmission Risk Prevention Plan 06/28/2019 06/15/2019 06/14/2019  Transportation Screening Complete - Complete  PCP or Specialist Appt within 3-5 Days - Complete -  HRI or Home Care Consult Complete Complete Complete  Social Work Consult for [place] Planning/Counseling Complete - Complete  Palliative Care Screening Not Applicable - Not Applicable  Medication Review Press photographer) Complete - Complete  Some recent data might be hidden

## 2019-07-01 LAB — GLUCOSE, CAPILLARY
Glucose-Capillary: 209 mg/dL — ABNORMAL HIGH (ref 70–99)
Glucose-Capillary: 221 mg/dL — ABNORMAL HIGH (ref 70–99)
Glucose-Capillary: 245 mg/dL — ABNORMAL HIGH (ref 70–99)
Glucose-Capillary: 231 mg/dL — ABNORMAL HIGH (ref 70–99)

## 2019-07-01 LAB — COMPREHENSIVE METABOLIC PANEL
ALT: 19 U/L (ref 0–44)
AST: 30 U/L (ref 15–41)
Albumin: 2.4 g/dL — ABNORMAL LOW (ref 3.5–5.0)
Alkaline Phosphatase: 98 U/L (ref 38–126)
Anion gap: 13 (ref 5–15)
BUN: 96 mg/dL — ABNORMAL HIGH (ref 8–23)
CO2: 20 mmol/L — ABNORMAL LOW (ref 22–32)
Calcium: 8.3 mg/dL — ABNORMAL LOW (ref 8.9–10.3)
Chloride: 105 mmol/L (ref 98–111)
Creatinine, Ser: 2.71 mg/dL — ABNORMAL HIGH (ref 0.44–1.00)
GFR calc Af Amer: 20 mL/min — ABNORMAL LOW (ref >60)
GFR calc non Af Amer: 17 mL/min — ABNORMAL LOW (ref >60)
Glucose, Bld: 232 mg/dL — ABNORMAL HIGH (ref 70–99)
Potassium: 4.6 mmol/L (ref 3.5–5.1)
Sodium: 138 mmol/L (ref 135–145)
Total Bilirubin: 0.7 mg/dL (ref 0.3–1.2)
Total Protein: 5.8 g/dL — ABNORMAL LOW (ref 6.5–8.1)

## 2019-07-01 LAB — CBC
HCT: 37.5 % (ref 36.0–46.0)
Hemoglobin: 11.2 g/dL — ABNORMAL LOW (ref 12.0–15.0)
MCH: 26 pg (ref 26.0–34.0)
MCHC: 29.9 g/dL — ABNORMAL LOW (ref 30.0–36.0)
MCV: 87.2 fL (ref 80.0–100.0)
Platelets: 168 10*3/uL (ref 150–400)
RBC: 4.3 MIL/uL (ref 3.87–5.11)
RDW: 24 % — ABNORMAL HIGH (ref 11.5–15.5)
WBC: 9.6 10*3/uL (ref 4.0–10.5)
nRBC: 0 % (ref 0.0–0.2)

## 2019-07-01 MED ORDER — TAMSULOSIN HCL 0.4 MG PO CAPS
0.4000 mg | ORAL_CAPSULE | Freq: Once | ORAL | Status: AC
  Administered 2019-07-01: 0.4 mg via ORAL
  Filled 2019-07-01: qty 1

## 2019-07-01 NOTE — Discharge Summary (Signed)
Patient Demographics:    Penny Martinez, is a 68 y.o. female, DOB - 1951/04/11, AOZ:[id number]  Admit date - 06/27/2019   Admitting Physician Vassie Loll, MD  Outpatient Primary MD for the patient is [name], MD  LOS - 4   Chief Complaint  Patient presents with  . Tremors        Subjective:    Penny Martinez today has no fevers, no emesis,  No chest pain,  --- less confused, more awake - Extensive conversation with patient's husband by phone today -Patient's husband tells me patient is not compliant with CPAP machine at home -Patient confirms that she had a respiratory illness few years ago which she blamed on her CPAP-so she has not used CPAP since then -   Assessment  & Plan :    Principal Problem:   Acute on chronic renal failure (HCC) Active Problems:   Diabetes mellitus with stage 3 chronic kidney disease (HCC)   Hepatic cirrhosis (HCC)   Essential hypertension, benign   Morbid obesity with BMI of 40.0-44.9, adult (HCC)   Other specified hypothyroidism   Mixed hyperlipidemia   GERD (gastroesophageal reflux disease)   Hyperammonemia (HCC)  Brief summary 68 y.o. female with past medical history significant for gastroesophageal reflux disease, diastolic heart failure, NAS cirrhosis, morbid obesity, dyslipidemia, hypertension, type 2 diabetes mellitus with nephropathy, chronic kidney disease stage III, hypothyroidism and a sleep apnea  A/p 1)Hepatic Encephalopathy in the setting of underlying Nash liver cirrhosis--on admission ammonia was 105, down to 84, continue lactulose -Her mentation appears back to baseline -MELD Score-  19, with 6% Mortality over next 3 months -Plan is for palliative/therapeutic paracentesis on 07/03/2019 prior to discharge to SNF rehab  2)AKI----acute kidney injury on CKD stage -hyperkalemia noted, worsening renal function due to decreased p.o. intake  in the setting of hepatic encephalopathy compounded by benazepril and torsemide use   creatinine on admission=3.86  ,   baseline creatinine =1.4    , creatinine is now= 2.71   , renally adjust medications, avoid nephrotoxic agents/dehydration/hypotension -Hold benazepril and torsemide -Potassium is down to 4.4 after 2 doses of Kayexalate over a couple of days  3)DM2-with nephropathy and neuropathy---Recent A1c 7.0 reflecting good diabetic control PTA -Hypoglycemia persist at this time so Increase Lantus insulin to 22 units twice daily from 20 units twice daily,  Use Novolog/Humalog Sliding scale insulin with Accu-Cheks/Fingersticks as ordered   4)GERD-continue Protonix  5)Hypothyroidism-continue levothyroxine  6)Generalized Weakness/Deconditioning--- physical therapy evaluation appreciated, recommend SNF rehab, patient and family agreeable to SNF rehab -Discussed with patient, patient's husband and patient's daughter Ms Penny Martinez  7) morbid obesity/OSA- -Patient's husband tells me patient is not compliant with CPAP machine at home -Patient confirms that she had a respiratory illness few years ago which she blamed on her CPAP-so she has not used CPAP since then -Untreated sleep apnea may be contributing to patient's fatigue, generalized weakness and episodes of disorientation -Patient adamantly refuses to go for another sleep study to get her CPAP adjusted  Disposition/Need for in-Hospital Stay- patient unable to be discharged at this time due to -worsening renal function and persistent hyperkalemia as well as hepatic encephalopathy requiring lactulose with risk for further dehydration and worsening renal failure  Code Status :  Full  Family Communication:    (patient is alert, awake and coherent) Discussed with patient's husband -Also discussed with patient's daughter [name]-- [phone]  Disposition Plan  : Anticipate discharge  to SNF Rehab  Consults  :  NA  DVT  Prophylaxis  :  - Heparin - SCDs  Lab Results  Component Value Date   PLT 168 07/01/2019    Inpatient Medications  Scheduled Meds: . amitriptyline  25 mg Oral QHS  . aspirin EC  81 mg Oral Q breakfast  . Chlorhexidine Gluconate Cloth  6 each Topical Daily  . donepezil  5 mg Oral QHS  . ezetimibe  10 mg Oral q morning - 10a  . ferrous sulfate  325 mg Oral Daily  . gabapentin  100 mg Oral TID  . heparin injection (subcutaneous)  5,000 Units Subcutaneous Q8H  . insulin aspart  0-15 Units Subcutaneous TID WC  . insulin glargine  22 Units Subcutaneous BID  . lactulose  30 g Oral BID  . levothyroxine  100 mcg Oral Daily  . liver oil-zinc oxide   Topical BID  . pantoprazole  40 mg Oral Daily   Continuous Infusions: . sodium chloride 20 mL/hr at 06/30/19 1841   PRN Meds:.azelastine, ondansetron **OR** ondansetron (ZOFRAN) IV, oxyCODONE    Anti-infectives (From admission, onward)   None        Objective:   Vitals:   07/01/19 0700 07/01/19 0832 07/01/19 1024 07/01/19 1340  BP:   125/64 134/87  Pulse:   99 98  Resp:    19  Temp:    98.2 F (36.8 C)  TempSrc:    Oral  SpO2:  91% 100% 91%  Weight: 106.4 kg     Height:        Wt Readings from Last 3 Encounters:  07/01/19 106.4 kg  06/15/19 106.1 kg  05/18/19 102.5 kg     Intake/Output Summary (Last 24 hours) at 07/01/2019 1441 Last data filed at 07/01/2019 1300 Gross per 24 hour  Intake 360 ml  Output 1101 ml  Net -741 ml   Physical Exam Gen:-More awake and more alert HEENT:- [place].AT, No sclera icterus Neck-Supple Neck,No JVD,.  Lungs-mostly clear, fair symmetrical air movement CV- S1, S2 normal, regular  Abd-  +ve B.Sounds, Abd Soft, very distended/ascites,    Extremity/Skin:- No  edema, pedal pulses present  Psych-affect is appropriate, oriented x3 Neuro-generalized weakness without new focal deficits,  no tremors -  Data Review:   Micro Results Recent Results (from the past 240 hour(s))  Urine  Culture     Status: None   Collection Time: 06/27/19  2:39 PM   Specimen: Urine, Clean Catch  Result Value Ref Range Status   Specimen Description   Final    URINE, CLEAN CATCH Performed at [facility], [address], [place], [place] [address]    Special Requests   Final    NONE Performed at [facility], [address] [place], [place] [address]    Culture   Final    NO GROWTH Performed at [facility], [address], [place], [place] [address]    Report Status 06/28/2019 FINAL  Final  SARS CORONAVIRUS 2 (TAT 6-24 HRS) Nasopharyngeal Nasopharyngeal Swab     Status: None   Collection Time: 06/27/19  4:33 PM   Specimen: Nasopharyngeal Swab  Result Value Ref Range Status   SARS Coronavirus 2 NEGATIVE NEGATIVE Final    Comment: (NOTE) SARS-CoV-2 target nucleic acids  are NOT DETECTED. The SARS-CoV-2 RNA is generally detectable in upper and lower respiratory specimens during the acute phase of infection. Negative results do not preclude SARS-CoV-2 infection, do not rule out co-infections with other pathogens, and should not be used as the sole basis for treatment or other patient management decisions. Negative results must be combined with clinical observations, patient history, and epidemiological information. The expected result is Negative. Fact Sheet for Patients: HairSlick.no Fact Sheet for Healthcare Providers: quierodirigir.com This test is not yet approved or cleared by the [place] FDA and  has been authorized for detection and/or diagnosis of SARS-CoV-2 by FDA under an Emergency Use Authorization (EUA). This EUA will remain  in effect (meaning this test can be used) for the duration of the COVID-19 declaration under Section 56 4(b)(1) of the Act, 21 U.S.C. section 360bbb-3(b)(1), unless the authorization is terminated or revoked sooner. Performed at [facility], [address], [place],  [place] [address]     Radiology Reports US Paracentesis  Result Date: 06/21/2019 INDICATION: Ascites EXAM: ULTRASOUND GUIDED  PARACENTESIS MEDICATIONS: None. COMPLICATIONS: None immediate. PROCEDURE: Informed written consent was obtained from the patient after a discussion of the risks, benefits and alternatives to treatment. A timeout was performed prior to the initiation of the procedure. Initial ultrasound scanning demonstrates a large amount of ascites within the right lower abdominal quadrant. The right lower abdomen was prepped and draped in the usual sterile fashion. 1% lidocaine was used for local anesthesia. Following this, a 19 gauge, 7-cm, Yueh catheter was introduced. An ultrasound image was saved for documentation purposes. The paracentesis was performed. The catheter was removed and a dressing was applied. The patient tolerated the procedure well without immediate post procedural complication. Patient received post-procedure intravenous albumin; see nursing notes for details. FINDINGS: A total of approximately 7.2 liters of clear yellow fluid was removed. Samples were sent to the laboratory as requested by the clinical team. IMPRESSION: Successful ultrasound-guided paracentesis yielding 7.2 liters of peritoneal fluid. Electronically Signed   By: Charlett Nose M.D.   On: 06/21/2019 15:07   Dg Chest Port 1 View  Result Date: 06/13/2019 CLINICAL DATA:  Cough and dyspnea EXAM: PORTABLE CHEST 1 VIEW COMPARISON:  05/23/2018 chest radiograph. FINDINGS: Stable cardiomediastinal silhouette with top-normal heart size. No pneumothorax. Small left pleural effusion. No right pleural effusion. Patchy left lung base consolidation. No pulmonary edema. IMPRESSION: 1. Patchy left lung base consolidation suspicious for pneumonia. Recommend follow-up PA and lateral post treatment chest radiographs in 4-6 weeks. 2. Small left pleural effusion. Electronically Signed   By: Delbert Phenix M.D.   On: 06/13/2019 20:45    CBC  Recent Labs  Lab 06/27/19 1334 06/29/19 0548 07/01/19 0709  WBC 8.7 6.6 9.6  HGB 12.1 11.2* 11.2*  HCT 40.7 36.9 37.5  PLT 183 181 168  MCV 86.0 86.0 87.2  MCH 25.6* 26.1 26.0  MCHC 29.7* 30.4 29.9*  RDW 23.9* 23.9* 24.0*  LYMPHSABS 0.9  --   --   MONOABS 0.8  --   --   EOSABS 0.1  --   --   BASOSABS 0.0  --   --     Chemistries  Recent Labs  Lab 06/27/19 1334 06/28/19 0855 06/29/19 0548 06/30/19 0603 07/01/19 0709  NA 129* 133* 132* 136 138  K 6.0* 6.1* 5.4* 4.4 4.6  CL 96* 100 101 104 105  CO2 21* 18* 20* 19* 20*  GLUCOSE 150* 185* 272* 219* 232*  BUN 89* 90*  91* 88* 96*  CREATININE 3.86* 3.48* 3.37* 2.93* 2.71*  CALCIUM 8.3* 8.3* 7.5* 7.9* 8.3*  MG 2.9*  2.8*  --   --   --   --   AST 39 50* 34  --  30  ALT 22 24 20   --  19  ALKPHOS 109 131* 117  --  98  BILITOT 0.7 1.2 0.8  --  0.7   ------------------------------------------------------------------------------------------------------------------ No results for input(s): CHOL, HDL, LDLCALC, TRIG, CHOLHDL, LDLDIRECT in the last 72 hours.  Lab Results  Component Value Date   HGBA1C 7.0 (H) 05/31/2019   ------------------------------------------------------------------------------------------------------------------ No results for input(s): TSH, T4TOTAL, T3FREE, THYROIDAB in the last 72 hours.  Invalid input(s): FREET3 ------------------------------------------------------------------------------------------------------------------ No results for input(s): VITAMINB12, FOLATE, FERRITIN, TIBC, IRON, RETICCTPCT in the last 72 hours.  Coagulation profile No results for input(s): INR, PROTIME in the last 168 hours.  No results for input(s): DDIMER in the last 72 hours.  Cardiac Enzymes No results for input(s): CKMB, TROPONINI, MYOGLOBIN in the last 168 hours.  Invalid input(s): CK ------------------------------------------------------------------------------------------------------------------     Component Value Date/Time   BNP 34.0 06/13/2019 1926   Alaila Pillard M.D on 07/01/2019 at 2:41 PM  Go to [email or web address] - for contact info  [facility] - Office  [phone]

## 2019-07-02 DIAGNOSIS — K219 Gastro-esophageal reflux disease without esophagitis: Secondary | ICD-10-CM

## 2019-07-02 LAB — GLUCOSE, CAPILLARY
Glucose-Capillary: 221 mg/dL — ABNORMAL HIGH (ref 70–99)
Glucose-Capillary: 211 mg/dL — ABNORMAL HIGH (ref 70–99)
Glucose-Capillary: 185 mg/dL — ABNORMAL HIGH (ref 70–99)
Glucose-Capillary: 193 mg/dL — ABNORMAL HIGH (ref 70–99)

## 2019-07-02 LAB — RENAL FUNCTION PANEL
Albumin: 2.4 g/dL — ABNORMAL LOW (ref 3.5–5.0)
Anion gap: 8 (ref 5–15)
BUN: 92 mg/dL — ABNORMAL HIGH (ref 8–23)
CO2: 21 mmol/L — ABNORMAL LOW (ref 22–32)
Calcium: 7.9 mg/dL — ABNORMAL LOW (ref 8.9–10.3)
Chloride: 106 mmol/L (ref 98–111)
Creatinine, Ser: 2.76 mg/dL — ABNORMAL HIGH (ref 0.44–1.00)
GFR calc Af Amer: 20 mL/min — ABNORMAL LOW (ref >60)
GFR calc non Af Amer: 17 mL/min — ABNORMAL LOW (ref >60)
Glucose, Bld: 239 mg/dL — ABNORMAL HIGH (ref 70–99)
Phosphorus: 5.4 mg/dL — ABNORMAL HIGH (ref 2.5–4.6)
Potassium: 4.4 mmol/L (ref 3.5–5.1)
Sodium: 135 mmol/L (ref 135–145)

## 2019-07-02 MED ORDER — INSULIN GLARGINE 100 UNIT/ML ~~LOC~~ SOLN
24.0000 [IU] | Freq: Two times a day (BID) | SUBCUTANEOUS | Status: DC
  Administered 2019-07-02 – 2019-07-04 (×4): 24 [IU] via SUBCUTANEOUS
  Filled 2019-07-02 (×6): qty 0.24

## 2019-07-02 NOTE — Progress Notes (Addendum)
Patient Demographics:    Penny Martinez, is a 68 y.o. female, DOB - 29-Jul-1951, ZOX:[id number]  Admit date - 06/27/2019   Admitting Physician Vassie Loll, MD  Outpatient Primary MD for the patient is [name], MD  LOS - 5   Chief Complaint  Patient presents with  . Tremors        Subjective:    Penny Martinez today has no fevers, no emesis,  No chest pain,   - -Awake and coherent, -Having Loose stools -Voiding okay after removal of Foley   Assessment  & Plan :    Principal Problem:   Acute on chronic renal failure (HCC) Active Problems:   Diabetes mellitus with stage 3 chronic kidney disease (HCC)   Hepatic cirrhosis (HCC)   Essential hypertension, benign   Morbid obesity with BMI of 40.0-44.9, adult (HCC)   Other specified hypothyroidism   Mixed hyperlipidemia   GERD (gastroesophageal reflux disease)   Hyperammonemia (HCC)  Brief Summary 68 y.o. female with past medical history significant for gastroesophageal reflux disease, diastolic heart failure, NAS cirrhosis, morbid obesity, dyslipidemia, hypertension, type 2 diabetes mellitus with nephropathy, chronic kidney disease stage III, hypothyroidism and  sleep apnea  A/p 1)Hepatic Encephalopathy in the setting of underlying Nash liver cirrhosis with Ascites--on admission ammonia was 105, down to 84, continue lactulose -Her mentation appears back to baseline -MELD Score-  19, with 6% Mortality over next 3 months -Plan is for palliative/therapeutic paracentesis on 07/03/2019 prior to discharge to SNF rehab  2)AKI----acute kidney injury on CKD stage -hyperkalemia noted, worsening renal function due to decreased p.o. intake in the setting of hepatic encephalopathy compounded by benazepril and torsemide use  -??  Some component of hepatorenal syndrome   creatinine on admission=3.86  ,   baseline creatinine =1.4    , creatinine is  now= 2.76  , renally adjust medications, avoid nephrotoxic agents/dehydration/hypotension -Continue to hold benazepril and torsemide -Potassium is down to 4.4 after 2 doses of Kayexalate over the last few days  3)DM2-with nephropathy and neuropathy---Recent A1c 7.0 reflecting good diabetic control PTA -Hyperglycemia persist at this time so Increase Lantus insulin further to 24 units twice daily   -Use Novolog/Humalog Sliding scale insulin with Accu-Cheks/Fingersticks as ordered   4)GERD-continue Protonix  5)Hypothyroidism-continue levothyroxine  6)Generalized Weakness/Deconditioning--- physical therapy evaluation appreciated, recommend SNF rehab, patient and family agreeable to SNF rehab -Discussed with patient, patient's husband and patient's daughter Ms Penny Martinez  7) morbid obesity/OSA- -Patient's husband tells me patient is not compliant with CPAP machine at home -Patient confirms that she had a respiratory illness few years ago which she blamed on her CPAP-so she has not used CPAP since then -Untreated sleep apnea may be contributing to patient's fatigue, generalized weakness and episodes of disorientation -Patient refusing to use CPAP via -Patient adamantly refuses to go for another sleep study to get her CPAP adjusted  Disposition/Need for in-Hospital Stay- patient unable to be discharged at this time due to -worsening renal function and persistent hyperkalemia as well as hepatic encephalopathy requiring lactulose with risk for further dehydration and worsening renal failure -Awaiting SNF rehab placement  Code Status : Full  Family Communication:    (patient is alert, awake and coherent) Discussed with patient's husband -Also discussed  previously with patient's daughter [name]-- [phone]  Disposition Plan  : Anticipate discharge  to SNF Rehab after paracentesis on 07/03/2019  Consults  :  NA  DVT Prophylaxis  :  - Heparin - SCDs  Lab Results  Component  Value Date   PLT 168 07/01/2019    Inpatient Medications  Scheduled Meds: . amitriptyline  25 mg Oral QHS  . aspirin EC  81 mg Oral Q breakfast  . Chlorhexidine Gluconate Cloth  6 each Topical Daily  . donepezil  5 mg Oral QHS  . ezetimibe  10 mg Oral q morning - 10a  . ferrous sulfate  325 mg Oral Daily  . gabapentin  100 mg Oral TID  . heparin injection (subcutaneous)  5,000 Units Subcutaneous Q8H  . insulin aspart  0-15 Units Subcutaneous TID WC  . insulin glargine  22 Units Subcutaneous BID  . lactulose  30 g Oral BID  . levothyroxine  100 mcg Oral Daily  . liver oil-zinc oxide   Topical BID  . pantoprazole  40 mg Oral Daily   Continuous Infusions: . sodium chloride 30 mL/hr at 07/01/19 1528   PRN Meds:.azelastine, ondansetron **OR** ondansetron (ZOFRAN) IV, oxyCODONE    Anti-infectives (From admission, onward)   None        Objective:   Vitals:   07/01/19 2203 07/01/19 2204 07/02/19 0429 07/02/19 0500  BP: 105/75 105/75 137/71   Pulse: (!) 102 (!) 105 (!) 105   Resp:  20    Temp: 97.9 F (36.6 C) 97.9 F (36.6 C) 97.6 F (36.4 C)   TempSrc: Oral Oral Oral   SpO2: 92% 90% 91%   Weight:    113.9 kg  Height:        Wt Readings from Last 3 Encounters:  07/02/19 113.9 kg  06/15/19 106.1 kg  05/18/19 102.5 kg     Intake/Output Summary (Last 24 hours) at 07/02/2019 1524 Last data filed at 07/02/2019 0200 Gross per 24 hour  Intake 240 ml  Output 2 ml  Net 238 ml   Physical Exam Gen:-More awake and more alert HEENT:- [place].AT,  Neck-Supple Neck,No JVD,.  Lungs-mostly clear, fair symmetrical air movement CV- S1, S2 normal, regular  Abd-  +ve B.Sounds, very distended/ascites,    Extremity/Skin:- No  edema, pedal pulses present  Psych-affect is appropriate, oriented x3 Neuro-generalized weakness without new focal deficits,  no tremors -  Data Review:   Micro Results Recent Results (from the past 240 hour(s))  Urine Culture     Status: None    Collection Time: 06/27/19  2:39 PM   Specimen: Urine, Clean Catch  Result Value Ref Range Status   Specimen Description   Final    URINE, CLEAN CATCH Performed at [facility], [address] [place], [place] [address]    Special Requests   Final    NONE Performed at [facility], [address] [place], [place] [address]    Culture   Final    NO GROWTH Performed at [facility], [address], [place], [place] [address]    Report Status 06/28/2019 FINAL  Final  SARS CORONAVIRUS 2 (TAT 6-24 HRS) Nasopharyngeal Nasopharyngeal Swab     Status: None   Collection Time: 06/27/19  4:33 PM   Specimen: Nasopharyngeal Swab  Result Value Ref Range Status   SARS Coronavirus 2 NEGATIVE NEGATIVE Final    Comment: (NOTE) SARS-CoV-2 target nucleic acids are NOT DETECTED. The SARS-CoV-2 RNA is generally detectable  in upper and lower respiratory specimens during the acute phase of infection. Negative results do not preclude SARS-CoV-2 infection, do not rule out co-infections with other pathogens, and should not be used as the sole basis for treatment or other patient management decisions. Negative results must be combined with clinical observations, patient history, and epidemiological information. The expected result is Negative. Fact Sheet for Patients: HairSlick.no Fact Sheet for Healthcare Providers: quierodirigir.com This test is not yet approved or cleared by the [place] FDA and  has been authorized for detection and/or diagnosis of SARS-CoV-2 by FDA under an Emergency Use Authorization (EUA). This EUA will remain  in effect (meaning this test can be used) for the duration of the COVID-19 declaration under Section 56 4(b)(1) of the Act, 21 U.S.C. section 360bbb-3(b)(1), unless the authorization is terminated or revoked sooner. Performed at [facility], [address] [place], [place] [address]     Radiology  Reports US Paracentesis  Result Date: 06/21/2019 INDICATION: Ascites EXAM: ULTRASOUND GUIDED  PARACENTESIS MEDICATIONS: None. COMPLICATIONS: None immediate. PROCEDURE: Informed written consent was obtained from the patient after a discussion of the risks, benefits and alternatives to treatment. A timeout was performed prior to the initiation of the procedure. Initial ultrasound scanning demonstrates a large amount of ascites within the right lower abdominal quadrant. The right lower abdomen was prepped and draped in the usual sterile fashion. 1% lidocaine was used for local anesthesia. Following this, a 19 gauge, 7-cm, Yueh catheter was introduced. An ultrasound image was saved for documentation purposes. The paracentesis was performed. The catheter was removed and a dressing was applied. The patient tolerated the procedure well without immediate post procedural complication. Patient received post-procedure intravenous albumin; see nursing notes for details. FINDINGS: A total of approximately 7.2 liters of clear yellow fluid was removed. Samples were sent to the laboratory as requested by the clinical team. IMPRESSION: Successful ultrasound-guided paracentesis yielding 7.2 liters of peritoneal fluid. Electronically Signed   By: Charlett Nose M.D.   On: 06/21/2019 15:07   Dg Chest Port 1 View  Result Date: 06/13/2019 CLINICAL DATA:  Cough and dyspnea EXAM: PORTABLE CHEST 1 VIEW COMPARISON:  05/23/2018 chest radiograph. FINDINGS: Stable cardiomediastinal silhouette with top-normal heart size. No pneumothorax. Small left pleural effusion. No right pleural effusion. Patchy left lung base consolidation. No pulmonary edema. IMPRESSION: 1. Patchy left lung base consolidation suspicious for pneumonia. Recommend follow-up PA and lateral post treatment chest radiographs in 4-6 weeks. 2. Small left pleural effusion. Electronically Signed   By: Delbert Phenix M.D.   On: 06/13/2019 20:45    CBC Recent Labs  Lab 06/27/19  1334 06/29/19 0548 07/01/19 0709  WBC 8.7 6.6 9.6  HGB 12.1 11.2* 11.2*  HCT 40.7 36.9 37.5  PLT 183 181 168  MCV 86.0 86.0 87.2  MCH 25.6* 26.1 26.0  MCHC 29.7* 30.4 29.9*  RDW 23.9* 23.9* 24.0*  LYMPHSABS 0.9  --   --   MONOABS 0.8  --   --   EOSABS 0.1  --   --   BASOSABS 0.0  --   --     Chemistries  Recent Labs  Lab 06/27/19 1334 06/28/19 0855 06/29/19 0548 06/30/19 0603 07/01/19 0709 07/02/19 1122  NA 129* 133* 132* 136 138 135  K 6.0* 6.1* 5.4* 4.4 4.6 4.4  CL 96* 100 101 104 105 106  CO2 21* 18* 20* 19* 20* 21*  GLUCOSE 150* 185* 272* 219* 232* 239*  BUN 89* 90* 91* 88*  96* 92*  CREATININE 3.86* 3.48* 3.37* 2.93* 2.71* 2.76*  CALCIUM 8.3* 8.3* 7.5* 7.9* 8.3* 7.9*  MG 2.9*  2.8*  --   --   --   --   --   AST 39 50* 34  --  30  --   ALT 22 24 20   --  19  --   ALKPHOS 109 131* 117  --  98  --   BILITOT 0.7 1.2 0.8  --  0.7  --    ------------------------------------------------------------------------------------------------------------------ No results for input(s): CHOL, HDL, LDLCALC, TRIG, CHOLHDL, LDLDIRECT in the last 72 hours.  Lab Results  Component Value Date   HGBA1C 7.0 (H) 05/31/2019   ------------------------------------------------------------------------------------------------------------------ No results for input(s): TSH, T4TOTAL, T3FREE, THYROIDAB in the last 72 hours.  Invalid input(s): FREET3 ------------------------------------------------------------------------------------------------------------------ No results for input(s): VITAMINB12, FOLATE, FERRITIN, TIBC, IRON, RETICCTPCT in the last 72 hours.  Coagulation profile No results for input(s): INR, PROTIME in the last 168 hours.  No results for input(s): DDIMER in the last 72 hours.  Cardiac Enzymes No results for input(s): CKMB, TROPONINI, MYOGLOBIN in the last 168 hours.  Invalid input(s): CK  ------------------------------------------------------------------------------------------------------------------    Component Value Date/Time   BNP 34.0 06/13/2019 1926   Azaliyah Kennard M.D on 07/02/2019 at 3:24 PM  Go to [email or web address] - for contact info  [facility] - Office  [phone]

## 2019-07-03 ENCOUNTER — Inpatient Hospital Stay (HOSPITAL_COMMUNITY): Payer: Medicare Other

## 2019-07-03 ENCOUNTER — Encounter (HOSPITAL_COMMUNITY): Payer: Self-pay

## 2019-07-03 LAB — COMPREHENSIVE METABOLIC PANEL
ALT: 18 U/L (ref 0–44)
AST: 29 U/L (ref 15–41)
Albumin: 2.3 g/dL — ABNORMAL LOW (ref 3.5–5.0)
Alkaline Phosphatase: 93 U/L (ref 38–126)
Anion gap: 11 (ref 5–15)
BUN: 80 mg/dL — ABNORMAL HIGH (ref 8–23)
CO2: 20 mmol/L — ABNORMAL LOW (ref 22–32)
Calcium: 8.2 mg/dL — ABNORMAL LOW (ref 8.9–10.3)
Chloride: 106 mmol/L (ref 98–111)
Creatinine, Ser: 2.54 mg/dL — ABNORMAL HIGH (ref 0.44–1.00)
GFR calc Af Amer: 22 mL/min — ABNORMAL LOW (ref >60)
GFR calc non Af Amer: 19 mL/min — ABNORMAL LOW (ref >60)
Glucose, Bld: 172 mg/dL — ABNORMAL HIGH (ref 70–99)
Potassium: 4.2 mmol/L (ref 3.5–5.1)
Sodium: 137 mmol/L (ref 135–145)
Total Bilirubin: 0.8 mg/dL (ref 0.3–1.2)
Total Protein: 5.8 g/dL — ABNORMAL LOW (ref 6.5–8.1)

## 2019-07-03 LAB — URINALYSIS, ROUTINE W REFLEX MICROSCOPIC
Bilirubin Urine: NEGATIVE
Glucose, UA: NEGATIVE mg/dL
Ketones, ur: NEGATIVE mg/dL
Nitrite: NEGATIVE
Protein, ur: NEGATIVE mg/dL
Specific Gravity, Urine: 1.012 (ref 1.005–1.030)
pH: 5 (ref 5.0–8.0)

## 2019-07-03 LAB — GLUCOSE, CAPILLARY
Glucose-Capillary: 160 mg/dL — ABNORMAL HIGH (ref 70–99)
Glucose-Capillary: 162 mg/dL — ABNORMAL HIGH (ref 70–99)
Glucose-Capillary: 151 mg/dL — ABNORMAL HIGH (ref 70–99)
Glucose-Capillary: 118 mg/dL — ABNORMAL HIGH (ref 70–99)

## 2019-07-03 LAB — PROTIME-INR
INR: 1.4 — ABNORMAL HIGH (ref 0.8–1.2)
Prothrombin Time: 16.5 seconds — ABNORMAL HIGH (ref 11.4–15.2)

## 2019-07-03 LAB — CREATININE, URINE, RANDOM: Creatinine, Urine: 98.67 mg/dL

## 2019-07-03 LAB — SARS CORONAVIRUS 2 (TAT 6-24 HRS): SARS Coronavirus 2: NEGATIVE

## 2019-07-03 LAB — NA AND K (SODIUM & POTASSIUM), RAND UR
Potassium Urine: 14 mmol/L
Sodium, Ur: <10 mmol/L

## 2019-07-03 MED ORDER — ALBUMIN HUMAN 25 % IV SOLN
50.0000 g | Freq: Once | INTRAVENOUS | Status: AC
  Administered 2019-07-03: 50 g via INTRAVENOUS
  Filled 2019-07-03: qty 100

## 2019-07-03 MED ORDER — ACETAMINOPHEN 325 MG PO TABS
650.0000 mg | ORAL_TABLET | Freq: Four times a day (QID) | ORAL | Status: DC | PRN
  Administered 2019-07-03 – 2019-07-04 (×2): 650 mg via ORAL
  Filled 2019-07-03 (×2): qty 2

## 2019-07-03 MED ORDER — SODIUM CHLORIDE 0.9 % IV SOLN
1.0000 g | INTRAVENOUS | Status: DC
  Administered 2019-07-03: 1 g via INTRAVENOUS
  Filled 2019-07-03: qty 10

## 2019-07-03 NOTE — Progress Notes (Signed)
Patient Demographics:    Penny Martinez, is a 68 y.o. female, DOB - 09-05-1951, NKN:[id number]  Admit date - 06/27/2019   Admitting Physician Barton Dubois, MD  Outpatient Primary MD for the patient is Vidal Schwalbe, MD  LOS - 6   Chief Complaint  Patient presents with   Tremors        Subjective:    Penny Martinez today has no fevers, no emesis,  No chest pain,   - Tolerated paracentesis well, -Abdominal discomfort is improved, denies dysuria  Assessment  & Plan :    Principal Problem:   Acute on chronic renal failure ([place]) Active Problems:   Diabetes mellitus with stage 3 chronic kidney disease ([place])   Hepatic cirrhosis ([place])   Essential hypertension, benign   Morbid obesity with BMI of 40.0-44.9, adult ([place])   Other specified hypothyroidism   Mixed hyperlipidemia   GERD (gastroesophageal reflux disease)   Hyperammonemia ([place])  Brief Summary 68 y.o. female with past medical history significant for gastroesophageal reflux disease, diastolic heart failure, NAS cirrhosis, morbid obesity, dyslipidemia, hypertension, type 2 diabetes mellitus with nephropathy, chronic kidney disease stage III, hypothyroidism and  sleep apnea  A/p 1)Hepatic Encephalopathy in the setting of underlying [place] liver cirrhosis with Ascites--on admission ammonia was 105, down to 84, continue lactulose -Her mentation appears back to baseline -MELD Score-  19, with 6% Mortality over next 3 months -s/p  palliative/therapeutic paracentesis on 07/03/2019 with removal of 7 L of ascitic fluid   2)AKI----acute kidney injury on CKD stage -hyperkalemia noted, worsening renal function due to decreased p.o. intake in the setting of hepatic encephalopathy compounded by benazepril and torsemide use and transient hypotension/hemodynamic issues -??  Some component of hepatorenal syndrome   creatinine on admission=3.86  ,    baseline creatinine =1.4    , creatinine is now= 2.54  , renally adjust medications, avoid nephrotoxic agents/dehydration/hypotension -Continue to hold benazepril and torsemide -Potassium is down to 4.2 after 2 doses of Kayexalate over the last few days -Renal ultrasound without obstructive uropathy -Nephrology consult appreciated  3)DM2-with nephropathy and neuropathy---Recent A1c 7.0 reflecting good diabetic control PTA -Hyperglycemia persist at this time so Increase Lantus insulin further to 24 units twice daily   -Use Novolog/Humalog Sliding scale insulin with Accu-Cheks/Fingersticks as ordered   4)GERD-continue Protonix  5)Hypothyroidism-continue levothyroxine  6)Generalized Weakness/Deconditioning--- physical therapy evaluation appreciated, recommend SNF rehab, patient and family agreeable to SNF rehab -Discussed with patient, patient's husband and patient's daughter Ms Dennis Bast  7) morbid obesity/OSA- -Patient's husband tells me patient is not compliant with CPAP machine at home -Patient confirms that she had a respiratory illness few years ago which she blamed on her CPAP-so she has not used CPAP since then -Untreated sleep apnea may be contributing to patient's fatigue, generalized weakness and episodes of disorientation -Patient refusing to use CPAP via -Patient adamantly refuses to go for another sleep study to get her CPAP adjusted  8)Possible UTI-urine analysis suspicious for UTI, empiric treatment with IV Rocephin pending culture  Disposition/Need for in-Hospital Stay- patient unable to be discharged at this time due to -worsening renal function and persistent hyperkalemia as well as hepatic encephalopathy requiring lactulose with risk for further dehydration and worsening renal failure -Awaiting SNF rehab placement  Code Status : Full  Family Communication:    (patient is alert, awake and coherent) Discussed with patient's husband -Also discussed with patient's  daughter Ms. Dennis Bast-- [phone]  Disposition Plan  : Patient had large volume paracentesis on 07/03/2019, required albumin, BP was soft, anticipate discharge  to SNF  on 07/04/2019--if hemodynamically stable  Consults  : Nephrology  DVT Prophylaxis  :  - Heparin - SCDs  Lab Results  Component Value Date   PLT 168 07/01/2019    Inpatient Medications  Scheduled Meds:  amitriptyline  25 mg Oral QHS   aspirin EC  81 mg Oral Q breakfast   Chlorhexidine Gluconate Cloth  6 each Topical Daily   donepezil  5 mg Oral QHS   ezetimibe  10 mg Oral q morning - 10a   ferrous sulfate  325 mg Oral Daily   gabapentin  100 mg Oral TID   heparin injection (subcutaneous)  5,000 Units Subcutaneous Q8H   insulin aspart  0-15 Units Subcutaneous TID WC   insulin glargine  24 Units Subcutaneous BID   lactulose  30 g Oral BID   levothyroxine  100 mcg Oral Daily   liver oil-zinc oxide   Topical BID   pantoprazole  40 mg Oral Daily   Continuous Infusions:  cefTRIAXone (ROCEPHIN)  IV     PRN Meds:.acetaminophen, azelastine, ondansetron **OR** ondansetron (ZOFRAN) IV, oxyCODONE    Anti-infectives (From admission, onward)   Start     Dose/Rate Route Frequency Ordered Stop   07/03/19 1845  cefTRIAXone (ROCEPHIN) 1 g in sodium chloride 0.9 % 100 mL IVPB     1 g 200 mL/hr over 30 Minutes Intravenous Every 24 hours 07/03/19 1838          Objective:   Vitals:   07/03/19 0445 07/03/19 0447 07/03/19 1100 07/03/19 1153  BP:  125/71 125/60 (!) 108/53  Pulse: 99 97 92 94  Resp: 18  20 20   Temp: 98 F (36.7 C)     TempSrc: Oral     SpO2: 93%  94% 94%  Weight:  110.2 kg    Height:        Wt Readings from Last 3 Encounters:  07/03/19 110.2 kg  06/15/19 106.1 kg  05/18/19 102.5 kg     Intake/Output Summary (Last 24 hours) at 07/03/2019 1839 Last data filed at 07/03/2019 1500 Gross per 24 hour  Intake --  Output 500 ml  Net -500 ml   Physical Exam Gen:-More awake  and more alert HEENT:- [place].AT,  Neck-Supple Neck,No JVD,.  Lungs-mostly clear, fair symmetrical air movement CV- S1, S2 normal, regular  Abd-  +ve B.Sounds, much less distended after paracentesis, no CVA area tenderness Extremity/Skin:-1+ edema, pedal pulses present, patient with red blotches on extremities mostly Psych-affect is appropriate, oriented x3 Neuro-generalized weakness without new focal deficits,  no tremors -  Data Review:   Micro Results Recent Results (from the past 240 hour(s))  Urine Culture     Status: None   Collection Time: 06/27/19  2:39 PM   Specimen: Urine, Clean Catch  Result Value Ref Range Status   Specimen Description   Final    URINE, CLEAN CATCH Performed at [facility], [address], [place], [place] [address]    Special Requests   Final    NONE Performed at [facility], [address], [place], [place] [address]    Culture   Final    NO GROWTH Performed at [facility], [address] [place], [place] [address]    Report Status 06/28/2019 FINAL  Final  SARS CORONAVIRUS 2 (TAT 6-24 HRS) Nasopharyngeal Nasopharyngeal Swab     Status: None   Collection Time: 06/27/19  4:33 PM   Specimen: Nasopharyngeal Swab  Result Value Ref Range Status   SARS Coronavirus 2 NEGATIVE NEGATIVE Final    Comment: (NOTE) SARS-CoV-2 target nucleic acids are NOT DETECTED. The SARS-CoV-2 RNA is generally detectable in upper and lower respiratory specimens during the acute phase of infection. Negative results do not preclude SARS-CoV-2 infection, do not rule out co-infections with other pathogens, and should not be used as the sole basis for treatment or other patient management decisions. Negative results must be combined with clinical observations, patient history, and epidemiological information. The expected result is Negative. Fact Sheet for Patients: SugarRoll.be Fact Sheet for Healthcare  Providers: https://www.woods-mathews.com/ This test is not yet approved or cleared by the [place] FDA and  has been authorized for detection and/or diagnosis of SARS-CoV-2 by FDA under an Emergency Use Authorization (EUA). This EUA will remain  in effect (meaning this test can be used) for the duration of the COVID-19 declaration under Section 56 4(b)(1) of the Act, 21 U.S.C. section 360bbb-3(b)(1), unless the authorization is terminated or revoked sooner. Performed at [facility], [place] [address], [place], [place] [address]     Radiology Reports US Renal  Result Date: 07/03/2019 CLINICAL DATA:  Acute on chronic renal failure EXAM: RENAL / URINARY TRACT ULTRASOUND COMPLETE COMPARISON:  08/11/2018 FINDINGS: Right Kidney: Renal measurements: 12.1 x 4.2 x 6.3 cm = volume: 166 mL . Echogenicity within normal limits. No mass or hydronephrosis visualized. Left Kidney: Renal measurements: 9.9 x 4.6 x 4.2 cm = volume: 100 mL. Examination is somewhat limited secondary to poor acoustic windows. Echogenicity appears within normal limits. No mass or hydronephrosis visualized. Bladder: Appears normal for degree of bladder distention. Other: Small volume free fluid within the upper abdomen. IMPRESSION: 1. Negative for obstructive uropathy. 2. Small volume residual free fluid within the upper abdomen. Electronically Signed   By: Davina Poke M.D.   On: 07/03/2019 12:44   US Paracentesis  Result Date: 07/03/2019 INDICATION: Ascites EXAM: ULTRASOUND GUIDED  PARACENTESIS MEDICATIONS: None. COMPLICATIONS: None immediate. PROCEDURE: Informed written consent was obtained from the patient after a discussion of the risks, benefits and alternatives to treatment. A timeout was performed prior to the initiation of the procedure. Initial ultrasound scanning demonstrates a large amount of ascites within the right lower abdominal quadrant. The right lower abdomen was prepped and draped in the  usual sterile fashion. 1% lidocaine was used for local anesthesia. Following this, a 19 gauge, 7-cm, Yueh catheter was introduced. An ultrasound image was saved for documentation purposes. The paracentesis was performed. The catheter was removed and a dressing was applied. The patient tolerated the procedure well without immediate post procedural complication. FINDINGS: A total of approximately 7 liters of serosanguineous fluid was removed. IMPRESSION: Successful ultrasound-guided paracentesis yielding 7 liters of peritoneal fluid. Electronically Signed   By: Rolm Baptise M.D.   On: 07/03/2019 12:32   US Paracentesis  Result Date: 06/21/2019 INDICATION: Ascites EXAM: ULTRASOUND GUIDED  PARACENTESIS MEDICATIONS: None. COMPLICATIONS: None immediate. PROCEDURE: Informed written consent was obtained from the patient after a discussion of the risks, benefits and alternatives to treatment. A timeout was performed prior to the initiation of the procedure. Initial ultrasound scanning demonstrates a large amount of ascites within the right lower abdominal quadrant. The  right lower abdomen was prepped and draped in the usual sterile fashion. 1% lidocaine was used for local anesthesia. Following this, a 19 gauge, 7-cm, Yueh catheter was introduced. An ultrasound image was saved for documentation purposes. The paracentesis was performed. The catheter was removed and a dressing was applied. The patient tolerated the procedure well without immediate post procedural complication. Patient received post-procedure intravenous albumin; see nursing notes for details. FINDINGS: A total of approximately 7.2 liters of clear yellow fluid was removed. Samples were sent to the laboratory as requested by the clinical team. IMPRESSION: Successful ultrasound-guided paracentesis yielding 7.2 liters of peritoneal fluid. Electronically Signed   By: Rolm Baptise M.D.   On: 06/21/2019 15:07   Dg Chest Port 1 View  Result Date:  06/13/2019 CLINICAL DATA:  Cough and dyspnea EXAM: PORTABLE CHEST 1 VIEW COMPARISON:  05/23/2018 chest radiograph. FINDINGS: Stable cardiomediastinal silhouette with top-normal heart size. No pneumothorax. Small left pleural effusion. No right pleural effusion. Patchy left lung base consolidation. No pulmonary edema. IMPRESSION: 1. Patchy left lung base consolidation suspicious for pneumonia. Recommend follow-up PA and lateral post treatment chest radiographs in 4-6 weeks. 2. Small left pleural effusion. Electronically Signed   By: Ilona Sorrel M.D.   On: 06/13/2019 20:45    CBC Recent Labs  Lab 06/27/19 1334 06/29/19 0548 07/01/19 0709  WBC 8.7 6.6 9.6  HGB 12.1 11.2* 11.2*  HCT 40.7 36.9 37.5  PLT 183 181 168  MCV 86.0 86.0 87.2  MCH 25.6* 26.1 26.0  MCHC 29.7* 30.4 29.9*  RDW 23.9* 23.9* 24.0*  LYMPHSABS 0.9  --   --   MONOABS 0.8  --   --   EOSABS 0.1  --   --   BASOSABS 0.0  --   --     Chemistries  Recent Labs  Lab 06/27/19 1334 06/28/19 0855 06/29/19 0548 06/30/19 0603 07/01/19 0709 07/02/19 1122 07/03/19 0849  NA 129* 133* 132* 136 138 135 137  K 6.0* 6.1* 5.4* 4.4 4.6 4.4 4.2  CL 96* 100 101 104 105 106 106  CO2 21* 18* 20* 19* 20* 21* 20*  GLUCOSE 150* 185* 272* 219* 232* 239* 172*  BUN 89* 90* 91* 88* 96* 92* 80*  CREATININE 3.86* 3.48* 3.37* 2.93* 2.71* 2.76* 2.54*  CALCIUM 8.3* 8.3* 7.5* 7.9* 8.3* 7.9* 8.2*  MG 2.9*   2.8*  --   --   --   --   --   --   AST 39 50* 34  --  30  --  29  ALT 22 24 20   --  19  --  18  ALKPHOS 109 131* 117  --  98  --  93  BILITOT 0.7 1.2 0.8  --  0.7  --  0.8   ------------------------------------------------------------------------------------------------------------------ No results for input(s): CHOL, HDL, LDLCALC, TRIG, CHOLHDL, LDLDIRECT in the last 72 hours.  Lab Results  Component Value Date   HGBA1C 7.0 (H) 05/31/2019    ------------------------------------------------------------------------------------------------------------------ No results for input(s): TSH, T4TOTAL, T3FREE, THYROIDAB in the last 72 hours.  Invalid input(s): FREET3 ------------------------------------------------------------------------------------------------------------------ No results for input(s): VITAMINB12, FOLATE, FERRITIN, TIBC, IRON, RETICCTPCT in the last 72 hours.  Coagulation profile Recent Labs  Lab 07/03/19 0553  INR 1.4*    No results for input(s): DDIMER in the last 72 hours.  Cardiac Enzymes No results for input(s): CKMB, TROPONINI, MYOGLOBIN in the last 168 hours.  Invalid input(s): CK ------------------------------------------------------------------------------------------------------------------    Component Value Date/Time   BNP 34.0 06/13/2019  9012   Roxan Hockey M.D on 07/03/2019 at 6:39 PM  Go to [email or web address] - for contact info  [facility] - Office  [phone]

## 2019-07-03 NOTE — Care Management Important Message (Signed)
Important Message  Patient Details  Name: Penny Martinez MRN: [id number] Date of Birth: 05/19/1951   Medicare Important Message Given:  Yes     Tommy Medal 07/03/2019, 1:27 PM

## 2019-07-03 NOTE — TOC Progression Note (Signed)
Transition of Care [facility]) - Progression Note    Patient Details  Name: Penny Martinez MRN: [id number] Date of Birth: 1951-03-06  Transition of Care [facility]) CM/SW Contact  Boneta Lucks, RN Phone Number: 07/03/2019, 2:14 PM  Clinical Narrative:  Patient is not medically ready today. [facility] also requesting updated COVID test before discharging.      Expected Discharge Plan: [place] Barriers to Discharge: Continued Medical Work up  Expected Discharge Plan and Services Expected Discharge Plan: [place] In-house Referral: Clinical Social Work     Living arrangements for the past 2 months: Single Family Home                                       Social Determinants of Health (SDOH) Interventions    Readmission Risk Interventions Readmission Risk Prevention Plan 06/28/2019 06/15/2019 06/14/2019  Transportation Screening Complete - Complete  PCP or Specialist Appt within 3-5 Days - Complete -  HRI or Home Care Consult Complete Complete Complete  Social Work Consult for [place] Planning/Counseling Complete - Complete  Palliative Care Screening Not Applicable - Not Applicable  Medication Review [employer] photographer) Complete - Complete  Some recent data might be hidden

## 2019-07-03 NOTE — Progress Notes (Signed)
Paracentesis complete no signs of distress.

## 2019-07-03 NOTE — Consult Note (Signed)
Reason for Consult: Acute kidney injury on chronic kidney disease stage III Referring Physician: Roxan Hockey MD [facility])  HPI:  68 year old Caucasian woman with past medical history significant for nonalcoholic steatohepatitis cirrhosis, morbid obesity, history of diastolic heart failure, type 2 diabetes mellitus, hypertension, dyslipidemia, hypothyroidism, OSA/OHS and chronic kidney disease stage III at baseline (creatinine ~1.1-1.4) who follows up with Dr. Juleen China of [place].  She was admitted 6 days ago with increased somnolence, intermittent confusion and abnormal limb jerking movements about 4 days after she underwent 7.2 L paracentesis with reported intravenous albumin thereafter.  In the emergency room, work-up was significant for elevated ammonia level of 106, elevated creatinine of 3.86, potassium 6.0 and sodium 129.  She was taking benazepril 5 mg daily and torsemide 20 mg daily prior to admission (stopped).  She has had sluggish renal recovery with cautious fluid replacement and holding diuretic therapy.  Initial urinalysis showed a specific gravity of 1012 with unremarkable microscopy.  Today, she complains of abdominal discomfort from increased ascites and expresses discontent with having to take lactulose.  She denies any chest pain but has some mild shortness of breath with occasional wheezing.  She denies any preceding dysuria, hematuria, flank pain, fever or chills.  She denies any NSAID use.    06/15/2019  06/27/2019  06/29/2019  07/01/2019  07/02/2019   BUN 28 (H) 89 (H) 91 (H) 96 (H) 92 (H)  Creatinine 1.39 (H) 3.86 (H) 3.37 (H) 2.71 (H) 2.76 (H)    Past Medical History:  Diagnosis Date  . Acid reflux   . Anemia   . Arthritis   . C. difficile colitis June/July 2015  . Cancer ([place])    skin cancer  . CHF (congestive heart failure) ([place])   . Cirrhosis ([place])    likely due to NASH. Negative viral markers 2015  . Complication of anesthesia   . COPD (chronic obstructive pulmonary  disease) ([place])   . Diabetes mellitus without complication ([place])   . GERD (gastroesophageal reflux disease)   . History of kidney stones   . Hypercholesteremia   . Hypertension   . Hypothyroidism   . IBS (irritable bowel syndrome)   . Kidney disease   . Liver disease   . Neuropathy   . PONV (postoperative nausea and vomiting)   . Sleep apnea   . Thyroid disease     Past Surgical History:  Procedure Laterality Date  . ABDOMINAL HYSTERECTOMY    . APPENDECTOMY    . CAROTID ARTERY - SUBCLAVIAN ARTERY BYPASS GRAFT Right   . CARPAL TUNNEL RELEASE Right   . CARPAL TUNNEL RELEASE Left 12/03/2016   Procedure: CARPAL TUNNEL RELEASE;  Surgeon: Carole Civil, MD;  Location: [facility];  Service: Orthopedics;  Laterality: Left;  . CATARACT EXTRACTION Bilateral   . CHOLECYSTECTOMY    . COLONOSCOPY N/A 02/19/2014   Dr. Gala Romney: rectal varices, rectal polyp overlying a varix non-manipulated  . ESOPHAGEAL BANDING N/A 08/15/2018   Procedure: ESOPHAGEAL BANDING;  Surgeon: Daneil Dolin, MD;  Location: [facility];  Service: Endoscopy;  Laterality: N/A;  . ESOPHAGEAL BANDING N/A 09/15/2018   Procedure: ESOPHAGEAL BANDING;  Surgeon: Daneil Dolin, MD;  Location: [facility];  Service: Endoscopy;  Laterality: N/A;  . ESOPHAGOGASTRODUODENOSCOPY N/A 02/19/2014   Dr. Gala Romney: 4 columns of grade 2 esophageal varices, multiple gastric polyps with largest biopsied and hyperplastic. Negative H.pylori  . ESOPHAGOGASTRODUODENOSCOPY N/A 04/13/2018   Dr. Gala Romney: Grade 3 esophageal varices, no esophagitis, portal gastropathy, multiple hyperplastic  appearing polyps  . ESOPHAGOGASTRODUODENOSCOPY (EGD) WITH PROPOFOL N/A 08/15/2018   Procedure: ESOPHAGOGASTRODUODENOSCOPY (EGD) WITH PROPOFOL;  Surgeon: Daneil Dolin, MD;  Location: [facility];  Service: Endoscopy;  Laterality: N/A;  1:45pm  . ESOPHAGOGASTRODUODENOSCOPY (EGD) WITH PROPOFOL N/A 09/15/2018   Dr. Gala Romney: Grade 2 esophageal varices, portal gastropathy,  normal first and second duodenum, s/p banding X 5. Surveillance in 1 year  . GIVENS CAPSULE STUDY N/A 05/04/2014   multiple polypoid-like lesions throughout small bowel, scattered superficial non-bleeding erosions more distal bowel, referred to [facility] for enteroscopy. Capsule study not complete, poor images.   Marland Kitchen TRIGGER FINGER RELEASE Left 12/03/2016   Procedure: RELEASE TRIGGER FINGER/A-1 PULLEY LEFT LONG TRIGGER FINGER RELEASE;  Surgeon: Carole Civil, MD;  Location: [facility];  Service: Orthopedics;  Laterality: Left;  . urosepsis      Family History  Problem Relation Age of Onset  . Diabetes Mother   . Hypertension Mother   . Kidney failure Mother   . Blindness Mother   . Aneurysm Father   . Colon cancer Neg Hx     Social History:  reports that she quit smoking about 16 years ago. She has a 70.00 pack-year smoking history. She has never used smokeless tobacco. She reports that she does not drink alcohol or use drugs.  Allergies:  Allergies  Allergen Reactions  . Erythromycin Hives  . Niacin Rash and Shortness Of Breath  . Penicillins Shortness Of Breath and Other (See Comments)    Has patient had a PCN reaction causing immediate rash, facial/tongue/throat swelling, SOB or lightheadedness with hypotension: Yes Has patient had a PCN reaction causing severe rash involving mucus membranes or skin necrosis: No Has patient had a PCN reaction that required hospitalization: No Has patient had a PCN reaction occurring within the last 10 years: No If all of the above answers are "NO", then may proceed with Cephalosporin use.   . Sulfa Antibiotics Hives  . Ciprofloxacin Itching and Nausea And Vomiting    Stomach pain  . Statins Other (See Comments)    Muscle Pain  . Codeine Itching  . Fluticasone Rash  . Tramadol Rash    Medications:  Scheduled: . amitriptyline  25 mg Oral QHS  . aspirin EC  81 mg Oral Q breakfast  . Chlorhexidine Gluconate Cloth  6 each Topical Daily  .  donepezil  5 mg Oral QHS  . ezetimibe  10 mg Oral q morning - 10a  . ferrous sulfate  325 mg Oral Daily  . gabapentin  100 mg Oral TID  . heparin injection (subcutaneous)  5,000 Units Subcutaneous Q8H  . insulin aspart  0-15 Units Subcutaneous TID WC  . insulin glargine  24 Units Subcutaneous BID  . lactulose  30 g Oral BID  . levothyroxine  100 mcg Oral Daily  . liver oil-zinc oxide   Topical BID  . pantoprazole  40 mg Oral Daily    BMP Latest Ref Rng & Units 07/03/2019 07/02/2019 07/01/2019  Glucose 70 - 99 mg/dL 172(H) 239(H) 232(H)  BUN 8 - 23 mg/dL 80(H) 92(H) 96(H)  Creatinine 0.44 - 1.00 mg/dL 2.54(H) 2.76(H) 2.71(H)  BUN/Creat Ratio 6 - 22 (calc) - - -  Sodium 135 - 145 mmol/L 137 135 138  Potassium 3.5 - 5.1 mmol/L 4.2 4.4 4.6  Chloride 98 - 111 mmol/L 106 106 105  CO2 22 - 32 mmol/L 20(L) 21(L) 20(L)  Calcium 8.9 - 10.3 mg/dL 8.2(L) 7.9(L) 8.3(L)   CBC Latest Ref  Rng & Units 07/01/2019 06/29/2019 06/27/2019  WBC 4.0 - 10.5 K/uL 9.6 6.6 8.7  Hemoglobin 12.0 - 15.0 g/dL 11.2(L) 11.2(L) 12.1  Hematocrit 36.0 - 46.0 % 37.5 36.9 40.7  Platelets 150 - 400 K/uL 168 181 183   Urinalysis    Component Value Date/Time   COLORURINE YELLOW 06/27/2019 1439   APPEARANCEUR HAZY (A) 06/27/2019 1439   LABSPEC 1.012 06/27/2019 1439   PHURINE 5.0 06/27/2019 1439   GLUCOSEU NEGATIVE 06/27/2019 1439   HGBUR NEGATIVE 06/27/2019 [place] 06/27/2019 1439   KETONESUR NEGATIVE 06/27/2019 1439   PROTEINUR NEGATIVE 06/27/2019 1439   UROBILINOGEN 0.2 03/05/2014 2140   NITRITE NEGATIVE 06/27/2019 1439   LEUKOCYTESUR TRACE (A) 06/27/2019 1439    Review of Systems  Constitutional: Positive for malaise/fatigue. Negative for chills and fever.  HENT: Negative.   Eyes: Negative.   Respiratory: Positive for shortness of breath and wheezing. Negative for cough and hemoptysis.   Cardiovascular: Positive for leg swelling. Negative for chest pain, palpitations and orthopnea.   Gastrointestinal: Positive for abdominal pain. Negative for blood in stool, diarrhea, heartburn, nausea and vomiting.  Genitourinary: Negative.   Musculoskeletal: Positive for back pain. Negative for myalgias and neck pain.  Skin: Negative.   Neurological: Positive for weakness. Negative for focal weakness and headaches.   Blood pressure 125/71, pulse 97, temperature 98 F (36.7 C), temperature source Oral, resp. rate 18, height 5' 1"  (1.549 m), weight 110.2 kg, SpO2 93 %. Physical Exam  Nursing note and vitals reviewed. Constitutional: She is oriented to person, place, and time. She appears well-developed and well-nourished. No distress.  Appears uncomfortable but not distressed.  Somewhat lethargic answering questions.  HENT:  Head: Normocephalic and atraumatic.  Mouth/Throat: Oropharynx is clear and moist. No oropharyngeal exudate.  Eyes: Pupils are equal, round, and reactive to light. Conjunctivae and EOM are normal.  Neck: Normal range of motion. Neck supple. No JVD present.  Cardiovascular: Normal rate, regular rhythm and normal heart sounds.  No murmur heard. Respiratory: Effort normal and breath sounds normal. She has no wheezes. She has no rales.  GI: She exhibits distension. There is abdominal tenderness.  Firm, distended globally with tenderness upon palpation.  Consistent with ascites.  Musculoskeletal: Normal range of motion.        General: Edema present.     Comments: 2+ lower extremity edema  Neurological: She is alert and oriented to person, place, and time.  Skin: Skin is warm and dry. Rash noted.  Legs with localized erythematous macules?  Insect bites    Assessment/Plan: 1.  Acute kidney injury on chronic kidney disease stage III: This appears to be hemodynamically mediated with preceding use of ACE inhibitor/diuretic and recent high-volume paracentesis.  Sluggish improvement of renal function noted which indicates that she may indeed have had a mild ATN from  hypoperfusion/ischemic mechanism.  At this time, I suspect that her increasing ascites/intra-abdominal hypertension is likely delaying renal recovery.  Blood pressures appear to be within acceptable range.  Urinalysis and urine electrolytes have been requested as has a renal ultrasound.  She does not have any acute electrolyte abnormalities and appears to be hypervolemic on exam.  I have requested for bladder scan to decide on need for indwelling Foley catheter.  I have ordered for intravenous albumin to accompany planned paracentesis today.  We will follow labs from today to decide on additional targets of therapy. 2.  Altered mental status/asterixis: Suspected to be from elevated ammonia level (hepatic encephalopathy) in  conjunction with acute on chronic renal insufficiency.  Ongoing treatment with lactulose. 3.  Hypertension: Blood pressures well controlled at this time, would not restart ACE inhibitor given hemodynamic flux (I recognize that this was started for diabetic kidney disease).  Will restart diuretic when renal function is back to baseline to limit rate of recurrence of ascites. 4.  NASH cirrhosis with generalized weakness/deconditioning: Plans noted for possible admission to skilled nursing facility upon discharge at this time.  She sees Dr. Gala Romney of gastroenterology as an outpatient. 5.  Morbid obesity  Oseph Imburgia K. 07/03/2019, 9:08 AM

## 2019-07-04 LAB — RENAL FUNCTION PANEL
Albumin: 2.7 g/dL — ABNORMAL LOW (ref 3.5–5.0)
Anion gap: 8 (ref 5–15)
BUN: 74 mg/dL — ABNORMAL HIGH (ref 8–23)
CO2: 21 mmol/L — ABNORMAL LOW (ref 22–32)
Calcium: 8.1 mg/dL — ABNORMAL LOW (ref 8.9–10.3)
Chloride: 108 mmol/L (ref 98–111)
Creatinine, Ser: 2.23 mg/dL — ABNORMAL HIGH (ref 0.44–1.00)
GFR calc Af Amer: 26 mL/min — ABNORMAL LOW (ref >60)
GFR calc non Af Amer: 22 mL/min — ABNORMAL LOW (ref >60)
Glucose, Bld: 142 mg/dL — ABNORMAL HIGH (ref 70–99)
Phosphorus: 3.6 mg/dL (ref 2.5–4.6)
Potassium: 4 mmol/L (ref 3.5–5.1)
Sodium: 137 mmol/L (ref 135–145)

## 2019-07-04 LAB — CBC
HCT: 41 % (ref 36.0–46.0)
Hemoglobin: 12.2 g/dL (ref 12.0–15.0)
MCH: 26 pg (ref 26.0–34.0)
MCHC: 29.8 g/dL — ABNORMAL LOW (ref 30.0–36.0)
MCV: 87.2 fL (ref 80.0–100.0)
Platelets: 139 10*3/uL — ABNORMAL LOW (ref 150–400)
RBC: 4.7 MIL/uL (ref 3.87–5.11)
RDW: 23.6 % — ABNORMAL HIGH (ref 11.5–15.5)
WBC: 7.2 10*3/uL (ref 4.0–10.5)
nRBC: 0 % (ref 0.0–0.2)

## 2019-07-04 LAB — GLUCOSE, CAPILLARY
Glucose-Capillary: 121 mg/dL — ABNORMAL HIGH (ref 70–99)
Glucose-Capillary: 122 mg/dL — ABNORMAL HIGH (ref 70–99)
Glucose-Capillary: 190 mg/dL — ABNORMAL HIGH (ref 70–99)

## 2019-07-04 MED ORDER — GABAPENTIN 100 MG PO CAPS: 100.0000 mg | ORAL_CAPSULE | Freq: Every day | ORAL | 3 refills | Status: AC

## 2019-07-04 MED ORDER — LACTULOSE 10 GM/15ML PO SOLN: 30.0000 g | Freq: Two times a day (BID) | ORAL | 0 refills | Status: AC

## 2019-07-04 MED ORDER — CEFDINIR 300 MG PO CAPS
300.0000 mg | ORAL_CAPSULE | Freq: Once | ORAL | Status: AC
  Administered 2019-07-04: 300 mg via ORAL
  Filled 2019-07-04: qty 1

## 2019-07-04 MED ORDER — INSULIN ASPART 100 UNIT/ML ~~LOC~~ SOLN: SUBCUTANEOUS | 3 refills | Status: DC

## 2019-07-04 MED ORDER — FERROUS SULFATE 325 (65 FE) MG PO TABS: 325.0000 mg | ORAL_TABLET | Freq: Every day | ORAL | 3 refills | Status: AC

## 2019-07-04 MED ORDER — CEFDINIR 300 MG PO CAPS: 300.0000 mg | ORAL_CAPSULE | Freq: Every day | ORAL | 0 refills | Status: AC

## 2019-07-04 MED ORDER — OXYCODONE HCL 5 MG PO TABS: 5.0000 mg | ORAL_TABLET | ORAL | 0 refills | Status: DC | PRN

## 2019-07-04 MED ORDER — ALBUMIN HUMAN 25 % IV SOLN
50.0000 g | Freq: Once | INTRAVENOUS | Status: AC
  Administered 2019-07-04: 50 g via INTRAVENOUS
  Filled 2019-07-04: qty 100

## 2019-07-04 MED ORDER — INSULIN GLARGINE 100 UNIT/ML ~~LOC~~ SOLN: 24.0000 [IU] | Freq: Two times a day (BID) | SUBCUTANEOUS | 11 refills | Status: DC

## 2019-07-04 MED ORDER — ALBUMIN HUMAN 25 % IV SOLN
50.0000 g | Freq: Once | INTRAVENOUS | Status: DC
  Filled 2019-07-04: qty 100

## 2019-07-04 NOTE — Discharge Summary (Signed)
Penny Martinez, is a 68 y.o. female  DOB November 22, 1950  MRN [id number].  Admission date:  06/27/2019  Admitting Physician  Barton Dubois, MD  Discharge Date:  07/04/2019   Primary MD  Vidal Schwalbe, MD  Recommendations for primary care physician for things to follow:  - 1)Avoid ibuprofen/Advil/Aleve/Motrin/Goody Powders/Naproxen/BC powders /Meloxicam/Diclofenac/Indomethacin and other Nonsteroidal anti-inflammatory medications as these will make you more likely to bleed and can cause stomach ulcers, can also cause Kidney problems.   2)Stop Benazepril, avoid ACE inhibitors or angiotensin receptor blockers class of medications ( ACEI/ARB/ARNI) due to kidney concerns  3)Please do NOT restart diuretics including Torsemide or Aldactone until creatinine is at or below 1.4  4)Repeat BMP (kidney and electrolyte test) and CBC blood test on Friday, 07/07/2019  5)Please follow-up with your kidney doctor/nephrologist- with Dr. Juleen China of [place] as an outpatient-after you leave rehab  6)Please follow-up with your liver doctor/gastroenterologist- Dr. Raul Del you leave rehab  7)You will need paracentesis/removal of fluid from your belly from time to time most likely every couple of weeks  8) avoid constipation as this will lead to hepatic encephalopathy which will make you confused, sleepy and disoriented -Goal is to have 2 to 3 semisolid/mushy stools per day--you need to take enough lactulose to avoid constipation  9)insulin aspart (novoLOG) injection 0-9 Units 0-9 Units,  Subcutaneous, 3 times daily with meals, CBG < 70: Implement Hypoglycemia Standing Orders and refer to Hypoglycemia Standing Orders sidebar report  CBG 70 - 120: 0 units  CBG 121 - 150: 1 unit  CBG 151 - 200: 2 units  CBG 201 - 250: 3 units  CBG 251 - 300: 5 units  CBG 301 - 350: 7 units  CBG 351 - 400: 9 units CBG > 400:  Admission  Diagnosis  Hepatic encephalopathy (HCC) [K72.90] Hyperkalemia [E87.5] Acute renal failure, unspecified acute renal failure type ([place]) [N17.9]   Discharge Diagnosis  Hepatic encephalopathy (HCC) [K72.90] Hyperkalemia [E87.5] Acute renal failure, unspecified acute renal failure type ([place]) [N17.9]    Principal Problem:   Acute on chronic renal failure (HCC) Active Problems:   Diabetes mellitus with stage 3 chronic kidney disease (HCC)   Hepatic cirrhosis ([place])   Essential hypertension, benign   Morbid obesity with BMI of 40.0-44.9, adult ([place])   Other specified hypothyroidism   Mixed hyperlipidemia   GERD (gastroesophageal reflux disease)   Hyperammonemia (HCC)      Past Medical History:  Diagnosis Date   Acid reflux    Anemia    Arthritis    C. 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 Negative viral markers 9935   Complication of anesthesia    COPD (chronic obstructive pulmonary disease) (HCC)    Diabetes mellitus without complication (HCC)    GERD (gastroesophageal reflux disease)    History of kidney stones    Hypercholesteremia    Hypertension  Hypothyroidism    IBS (irritable bowel syndrome)    Kidney disease    Liver disease    Neuropathy    PONV (postoperative nausea and vomiting)    Sleep apnea    Thyroid disease     Past Surgical History:  Procedure Laterality Date   ABDOMINAL HYSTERECTOMY     APPENDECTOMY     CAROTID ARTERY - SUBCLAVIAN ARTERY BYPASS GRAFT Right    CARPAL TUNNEL RELEASE Right    CARPAL TUNNEL RELEASE Left 12/03/2016   Procedure: CARPAL TUNNEL RELEASE;  Surgeon: Carole Civil, MD;  Location: [facility];  Service: Orthopedics;  Laterality: Left;   CATARACT EXTRACTION Bilateral    CHOLECYSTECTOMY     COLONOSCOPY N/A 02/19/2014   Dr. Gala Romney: rectal varices, rectal polyp overlying a varix non-manipulated     ESOPHAGEAL BANDING N/A 08/15/2018   Procedure: ESOPHAGEAL BANDING;  Surgeon: Daneil Dolin, MD;  Location: [facility];  Service: Endoscopy;  Laterality: N/A;   ESOPHAGEAL BANDING N/A 09/15/2018   Procedure: ESOPHAGEAL BANDING;  Surgeon: Daneil Dolin, MD;  Location: [facility];  Service: Endoscopy;  Laterality: N/A;   ESOPHAGOGASTRODUODENOSCOPY N/A 02/19/2014   Dr. Gala Romney: 4 columns of grade 2 esophageal varices, multiple gastric polyps with largest biopsied and hyperplastic. Negative H.pylori   ESOPHAGOGASTRODUODENOSCOPY N/A 04/13/2018   Dr. Gala Romney: Grade 3 esophageal varices, no esophagitis, portal gastropathy, multiple hyperplastic appearing polyps   ESOPHAGOGASTRODUODENOSCOPY (EGD) WITH PROPOFOL N/A 08/15/2018   Procedure: ESOPHAGOGASTRODUODENOSCOPY (EGD) WITH PROPOFOL;  Surgeon: Daneil Dolin, MD;  Location: [facility];  Service: Endoscopy;  Laterality: N/A;  1:45pm   ESOPHAGOGASTRODUODENOSCOPY (EGD) WITH PROPOFOL N/A 09/15/2018   Dr. Gala Romney: Grade 2 esophageal varices, portal gastropathy, normal first and second duodenum, s/p banding X 5. Surveillance in 1 year   GIVENS CAPSULE STUDY N/A 05/04/2014   multiple polypoid-like lesions throughout small bowel, scattered superficial non-bleeding erosions more distal bowel, referred to [facility] for enteroscopy. Capsule study not complete, poor images.    TRIGGER FINGER RELEASE Left 12/03/2016   Procedure: RELEASE TRIGGER FINGER/A-1 PULLEY LEFT LONG TRIGGER FINGER RELEASE;  Surgeon: Carole Civil, MD;  Location: [facility];  Service: Orthopedics;  Laterality: Left;   urosepsis         HPI  from the history and physical done on the day of admission:    - Chief Complaint: Confusion, tremors and not feeling well.  HPI: Penny Martinez is a 68 y.o. female with past medical history significant for gastroesophageal reflux disease, diastolic heart failure, NAS cirrhosis, morbid obesity, dyslipidemia, hypertension, type 2 diabetes  mellitus with nephropathy, chronic kidney disease stage III, hypothyroidism and a sleep apnea; who presented to the emergency department secondary to confusion tremors/muscle spasm and not feeling well.  Patient expressed having her usual paracentesis approximately 1 week or so ago and since that procedure has been experiencing mild tremors or jerking movement in her arms or legs, which has worsening in the last 2 days prior to admission.  Patient also reports feeling somnolent, with decreased appetite and intermittently confused.  Patient denies any shortness of breath, cough, fever, chills, nausea, vomiting, chest pain, palpitations, dysuria, hematuria, melena, hematochezia or any other complaints.  In the ED work-up has demonstrated elevated ammonia level in the 106 range, worsening renal function with a creatinine of 3.86, potassium 6.0 and sodium of 129. CO2 21.  Patient received a dose of lactulose, started for gentle fluid resuscitation, urinalysis has been check, bicarbonate and calcium gluconate given.  EKG without acute ischemic changes and normal sinus rhythm.  [facility] has been contacted to admit patient for further evaluation and management of acute on chronic renal failure and hyperammonemia.    Hospital Course:   Brief Summary 68 y.o.femalewith past medical history significant for gastroesophageal reflux disease, diastolic heart failure,NAScirrhosis, morbid obesity, dyslipidemia, hypertension, type 2 diabetes mellitus with nephropathy, chronic kidney disease stage III, hypothyroidism and  sleep apnea  A/p 1)Hepatic Encephalopathy in the setting of underlying [place] Liver cirrhosis with Ascites--on admission ammonia was 105, down to 84, continue lactulose -Her mentation appears back to baseline -MELD Score-  19, with 6% Mortality over next 3 months -S/p  palliative/therapeutic paracentesis on 07/03/2019 with removal of 7 L of ascitic fluid  - follow-up with gastroenterologist- Dr.  Raul Del rehab  2)AKI----acute kidney injury on CKD stage -hyperkalemia resolved- - worsening renal function due to decreased p.o. intake in the setting of hepatic encephalopathy compounded by benazepril and torsemide use and transient hypotension/hemodynamic issues -??  Some component of hepatorenal syndrome   creatinine on admission=3.86  ,   baseline creatinine =1.4    , creatinine is now= 2.2  , renally adjust medications, avoid nephrotoxic agents/dehydration/hypotension -Continue to hold benazepril and torsemide -Hyperkalemia resolved with 2 doses of Kayexalate -Renal ultrasound without obstructive uropathy -Overall renal function continues to improve slowly -Nephrology consult appreciated -follow-up with nephrologist- with Dr. Juleen China of [place] as an [place]  rehab -Stop Benazepril, avoid ACEI/ARB/ARNI due to renal concerns -Please do NOT restart diuretics including Torsemide or Aldactone until creatinine is at or below 1.4 -Repeat BMP and CBC blood test on Friday, 07/07/2019   3)DM2-with nephropathy and neuropathy---Recent A1c 7.0 reflecting good diabetic control PTA -Overall improved, discharged on Lantus insulin   -Use Novolog/Humalog Sliding scale insulin with Accu-Cheks/Fingersticks as ordered   4)GERD-continue Protonix  5)Hypothyroidism-continue levothyroxine  6)Generalized Weakness/Deconditioning--- physical therapy evaluation appreciated, recommend SNF rehab, patient and family agreeable to SNF rehab -Discussed with patient, patient's husband and patient's daughter Ms Olena Heckle are all agreeable to SNF rehab  7) morbid obesity/OSA- -Patient's husband tells me patient is not compliant with CPAP machine at home -Patient confirms that she had a respiratory illness few years ago which she blamed on her CPAP-so she has not used CPAP since then -Untreated sleep apnea may be contributing to patient's fatigue, generalized weakness and episodes of  disorientation -Patient refusing to use CPAP via -Patient adamantly refuses to go for another sleep study to get her CPAP adjusted  8)Possible UTI-urine analysis suspicious for UTI, patient received IV Rocephin, okay to discharge with empiric Omnicef adjusted for renal function  Disposition -transfer to SNF rehab  Code Status : Full  Family Communication:    (patient is alert, awake and coherent) Discussed with patient's husband today prior to discharge  -Also discussed  with patient's daughter Ms. Dennis Bast-- [phone] today prior to discharge  Consults  : Nephrology  Discharge Condition: stable  Follow UP  Contact information for after-discharge care    [facility] .   Service: Skilled Nursing Contact information: [address] [place] [phone]              Diet and Activity recommendation:  As advised  Discharge Instructions    Discharge Instructions    Call MD for:  difficulty breathing, headache or visual disturbances   Complete by: As directed    Call MD for:  persistant dizziness or light-headedness   Complete by:  As directed    Call MD for:  persistant nausea and vomiting   Complete by: As directed    Call MD for:  severe uncontrolled pain   Complete by: As directed    Call MD for:  temperature >100.4   Complete by: As directed    Diet - low sodium heart healthy   Complete by: As directed    Diet Carb Modified   Complete by: As directed    Discharge instructions   Complete by: As directed    1)Avoid ibuprofen/Advil/Aleve/Motrin/Goody Powders/Naproxen/BC powders /Meloxicam/Diclofenac/Indomethacin and other Nonsteroidal anti-inflammatory medications as these will make you more likely to bleed and can cause stomach ulcers, can also cause Kidney problems.   2)Stop Benazepril, avoid ACE inhibitors or angiotensin receptor blockers class of medications ( ACEI/ARB/ARNI) due to kidney  concerns  3)Please do NOT restart diuretics including Torsemide or Aldactone until creatinine is at or below 1.4  4)Repeat BMP (kidney and electrolyte test) and CBC blood test on Friday, 07/07/2019  5)Please follow-up with your kidney doctor/nephrologist- with Dr. Juleen China of [place] as an outpatient-after you leave rehab  6)Please follow-up with your liver doctor/gastroenterologist- Dr. Raul Del you leave rehab  7)You will need paracentesis/removal of fluid from your belly from time to time most likely every couple of weeks  8) avoid constipation as this will lead to hepatic encephalopathy which will make you confused, sleepy and disoriented -Goal is to have 2 to 3 semisolid/mushy stools per day--you need to take enough lactulose to avoid constipation  9)insulin aspart (novoLOG) injection 0-9 Units 0-9 Units,  Subcutaneous, 3 times daily with meals, CBG < 70: Implement Hypoglycemia Standing Orders and refer to Hypoglycemia Standing Orders sidebar report  CBG 70 - 120: 0 units  CBG 121 - 150: 1 unit  CBG 151 - 200: 2 units  CBG 201 - 250: 3 units  CBG 251 - 300: 5 units  CBG 301 - 350: 7 units  CBG 351 - 400: 9 units CBG > 400:   Increase activity slowly   Complete by: As directed        Discharge Medications     Allergies as of 07/04/2019      Reactions   Erythromycin Hives   Niacin Rash, Shortness Of Breath   Penicillins Shortness Of Breath, Other (See Comments)   Has patient had a PCN reaction causing immediate rash, facial/tongue/throat swelling, SOB or lightheadedness with hypotension: Yes Has patient had a PCN reaction causing severe rash involving mucus membranes or skin necrosis: No Has patient had a PCN reaction that required hospitalization: No Has patient had a PCN reaction occurring within the last 10 years: No If all of the above answers are "NO", then may proceed with Cephalosporin use. Ok with cephaloporins   Sulfa Antibiotics Hives   Ciprofloxacin Itching,  Nausea And Vomiting   Stomach pain   Statins Other (See Comments)   Muscle Pain   Codeine Itching   Fluticasone Rash   Tramadol Rash      Medication List    STOP taking these medications   benazepril 5 MG tablet Commonly known as: LOTENSIN   diphenhydrAMINE 25 MG tablet Commonly known as: SOMINEX   diphenoxylate-atropine 2.5-0.025 MG tablet Commonly known as: LOMOTIL   ferrous sulfate 325 (65 FE) MG EC tablet Replaced by: ferrous sulfate 325 (65 FE) MG tablet   HumuLIN R U-500 KwikPen 500 UNIT/ML kwikpen Generic drug: insulin regular human CONCENTRATED   torsemide 20 MG tablet Commonly known as: DEMADEX  TAKE these medications   amitriptyline 25 MG tablet Commonly known as: ELAVIL Take 25 mg by mouth at bedtime.   Aricept 5 MG tablet Generic drug: donepezil Take 5 mg by mouth at bedtime.   aspirin EC 81 MG tablet Take 1 tablet (81 mg total) by mouth daily with breakfast.   cefdinir 300 MG capsule Commonly known as: OMNICEF Take 1 capsule (300 mg total) by mouth daily for 3 days. Start taking on: July 05, 2019   CENTRUM SILVER ADULT 50+ PO Take 1 tablet by mouth daily.   CRANBERRY PO Take 15,000 mg by mouth at bedtime.   ezetimibe 10 MG tablet Commonly known as: ZETIA Take 10 mg by mouth every morning.   ferrous sulfate 325 (65 FE) MG tablet Take 1 tablet (325 mg total) by mouth daily. Start taking on: July 05, 2019 Replaces: ferrous sulfate 325 (65 FE) MG EC tablet   gabapentin 100 MG capsule Commonly known as: NEURONTIN Take 1 capsule (100 mg total) by mouth at bedtime. What changed:   medication strength  how much to take   insulin aspart 100 UNIT/ML injection Commonly known as: NovoLOG insulin aspart (novoLOG) injection 0-9 Units 0-9 Units, Subcutaneous, 3 times daily with meals, CBG < 70: Implement Hypoglycemia Standing Orders and refer to Hypoglycemia Standing Orders sidebar report  CBG 70 - 120: 0 units CBG 121 - 150: 1 unit CBG  151 - 200: 2 units CBG 201 - 250: 3 units CBG 251 - 300: 5 units CBG 301 - 350: 7 units CBG 351 - 400: 9 units CBG > 400:   insulin glargine 100 UNIT/ML injection Commonly known as: LANTUS Inject 0.24 mLs (24 Units total) into the skin 2 (two) times daily.   lactulose 10 GM/15ML solution Commonly known as: CHRONULAC Take 45 mLs (30 g total) by mouth 2 (two) times daily.   lansoprazole 30 MG capsule Commonly known as: PREVACID Take 1 capsule (30 mg total) by mouth 2 (two) times daily before a meal. What changed: when to take this   levothyroxine 100 MCG tablet Commonly known as: SYNTHROID TAKE 1 TABLET BY MOUTH ONCE DAILY. What changed: when to take this   ondansetron 4 MG tablet Commonly known as: ZOFRAN Take 1 tablet (4 mg total) by mouth 4 (four) times daily -  before meals and at bedtime.   oxyCODONE 5 MG immediate release tablet Commonly known as: Oxy IR/ROXICODONE Take 1 tablet (5 mg total) by mouth every 4 (four) hours as needed for moderate pain or severe pain.   Victoza 18 MG/3ML Sopn Generic drug: liraglutide INJECT 1.8MG SUBCUTANEOUSLY ONCE DAILY. What changed: See the new instructions.   Vitamin D (Ergocalciferol) 1.25 MG (50000 UT) Caps capsule Commonly known as: DRISDOL Take 50,000 Units by mouth every 30 (thirty) days.       Major procedures and Radiology Reports - PLEASE review detailed and final reports for all details, in brief -   US Renal  Result Date: 07/03/2019 CLINICAL DATA:  Acute on chronic renal failure EXAM: RENAL / URINARY TRACT ULTRASOUND COMPLETE COMPARISON:  08/11/2018 FINDINGS: Right Kidney: Renal measurements: 12.1 x 4.2 x 6.3 cm = volume: 166 mL . Echogenicity within normal limits. No mass or hydronephrosis visualized. Left Kidney: Renal measurements: 9.9 x 4.6 x 4.2 cm = volume: 100 mL. Examination is somewhat limited secondary to poor acoustic windows. Echogenicity appears within normal limits. No mass or hydronephrosis visualized.  Bladder: Appears normal for degree of bladder distention. Other: Small volume  free fluid within the upper abdomen. IMPRESSION: 1. Negative for obstructive uropathy. 2. Small volume residual free fluid within the upper abdomen. Electronically Signed   By: Davina Poke M.D.   On: 07/03/2019 12:44   US Paracentesis  Result Date: 07/03/2019 INDICATION: Ascites EXAM: ULTRASOUND GUIDED  PARACENTESIS MEDICATIONS: None. COMPLICATIONS: None immediate. PROCEDURE: Informed written consent was obtained from the patient after a discussion of the risks, benefits and alternatives to treatment. A timeout was performed prior to the initiation of the procedure. Initial ultrasound scanning demonstrates a large amount of ascites within the right lower abdominal quadrant. The right lower abdomen was prepped and draped in the usual sterile fashion. 1% lidocaine was used for local anesthesia. Following this, a 19 gauge, 7-cm, Yueh catheter was introduced. An ultrasound image was saved for documentation purposes. The paracentesis was performed. The catheter was removed and a dressing was applied. The patient tolerated the procedure well without immediate post procedural complication. FINDINGS: A total of approximately 7 liters of serosanguineous fluid was removed. IMPRESSION: Successful ultrasound-guided paracentesis yielding 7 liters of peritoneal fluid. Electronically Signed   By: Rolm Baptise M.D.   On: 07/03/2019 12:32   US Paracentesis  Result Date: 06/21/2019 INDICATION: Ascites EXAM: ULTRASOUND GUIDED  PARACENTESIS MEDICATIONS: None. COMPLICATIONS: None immediate. PROCEDURE: Informed written consent was obtained from the patient after a discussion of the risks, benefits and alternatives to treatment. A timeout was performed prior to the initiation of the procedure. Initial ultrasound scanning demonstrates a large amount of ascites within the right lower abdominal quadrant. The right lower abdomen was prepped and draped  in the usual sterile fashion. 1% lidocaine was used for local anesthesia. Following this, a 19 gauge, 7-cm, Yueh catheter was introduced. An ultrasound image was saved for documentation purposes. The paracentesis was performed. The catheter was removed and a dressing was applied. The patient tolerated the procedure well without immediate post procedural complication. Patient received post-procedure intravenous albumin; see nursing notes for details. FINDINGS: A total of approximately 7.2 liters of clear yellow fluid was removed. Samples were sent to the laboratory as requested by the clinical team. IMPRESSION: Successful ultrasound-guided paracentesis yielding 7.2 liters of peritoneal fluid. Electronically Signed   By: Rolm Baptise M.D.   On: 06/21/2019 15:07   Dg Chest Port 1 View  Result Date: 06/13/2019 CLINICAL DATA:  Cough and dyspnea EXAM: PORTABLE CHEST 1 VIEW COMPARISON:  05/23/2018 chest radiograph. FINDINGS: Stable cardiomediastinal silhouette with top-normal heart size. No pneumothorax. Small left pleural effusion. No right pleural effusion. Patchy left lung base consolidation. No pulmonary edema. IMPRESSION: 1. Patchy left lung base consolidation suspicious for pneumonia. Recommend follow-up PA and lateral post treatment chest radiographs in 4-6 weeks. 2. Small left pleural effusion. Electronically Signed   By: Ilona Sorrel M.D.   On: 06/13/2019 20:45    Micro Results    Recent Results (from the past 240 hour(s))  Urine Culture     Status: None   Collection Time: 06/27/19  2:39 PM   Specimen: Urine, Clean Catch  Result Value Ref Range Status   Specimen Description   Final    URINE, CLEAN CATCH Performed at [facility], [address] [place], [place] [address]    Special Requests   Final    NONE Performed at [facility], [address], [place], [place] [address]    Culture   Final    NO GROWTH Performed at [facility], [place] [address] [place], [place] [address]  Report Status 06/28/2019 FINAL  Final  SARS CORONAVIRUS 2 (TAT 6-24 HRS) Nasopharyngeal Nasopharyngeal Swab     Status: None   Collection Time: 06/27/19  4:33 PM   Specimen: Nasopharyngeal Swab  Result Value Ref Range Status   SARS Coronavirus 2 NEGATIVE NEGATIVE Final    Comment: (NOTE) SARS-CoV-2 target nucleic acids are NOT DETECTED. The SARS-CoV-2 RNA is generally detectable in upper and lower respiratory specimens during the acute phase of infection. Negative results do not preclude SARS-CoV-2 infection, do not rule out co-infections with other pathogens, and should not be used as the sole basis for treatment or other patient management decisions. Negative results must be combined with clinical observations, patient history, and epidemiological information. The expected result is Negative. Fact Sheet for Patients: SugarRoll.be Fact Sheet for Healthcare Providers: https://www.woods-mathews.com/ This test is not yet approved or cleared by the [place] FDA and  has been authorized for detection and/or diagnosis of SARS-CoV-2 by FDA under an Emergency Use Authorization (EUA). This EUA will remain  in effect (meaning this test can be used) for the duration of the COVID-19 declaration under Section 56 4(b)(1) of the Act, 21 U.S.C. section 360bbb-3(b)(1), unless the authorization is terminated or revoked sooner. Performed at [facility], [place] [address] [place], [place] [address]   SARS CORONAVIRUS 2 (TAT 6-24 HRS) Nasopharyngeal Nasopharyngeal Swab     Status: None   Collection Time: 07/03/19  2:16 PM   Specimen: Nasopharyngeal Swab  Result Value Ref Range Status   SARS Coronavirus 2 NEGATIVE NEGATIVE Final    Comment: (NOTE) SARS-CoV-2 target nucleic acids are NOT DETECTED. The SARS-CoV-2 RNA is generally detectable in upper and lower respiratory specimens during the acute phase of infection. Negative results do not preclude  SARS-CoV-2 infection, do not rule out co-infections with other pathogens, and should not be used as the sole basis for treatment or other patient management decisions. Negative results must be combined with clinical observations, patient history, and epidemiological information. The expected result is Negative. Fact Sheet for Patients: SugarRoll.be Fact Sheet for Healthcare Providers: https://www.woods-mathews.com/ This test is not yet approved or cleared by the [place] FDA and  has been authorized for detection and/or diagnosis of SARS-CoV-2 by FDA under an Emergency Use Authorization (EUA). This EUA will remain  in effect (meaning this test can be used) for the duration of the COVID-19 declaration under Section 56 4(b)(1) of the Act, 21 U.S.C. section 360bbb-3(b)(1), unless the authorization is terminated or revoked sooner. Performed at [facility], [place] [address] [place], [place] [address]        Today   Subjective    Loletha Bertini today has no new complaints -Voiding well        -Eating okay  Patient has been seen and examined prior to discharge   Objective   Blood pressure (!) 101/52, pulse 90, temperature (!) 97.4 F (36.3 C), resp. rate 16, height 5' 1"  (1.549 m), weight 106.7 kg, SpO2 98 %.   Intake/Output Summary (Last 24 hours) at 07/04/2019 1402 Last data filed at 07/04/2019 0300 Gross per 24 hour  Intake 392.11 ml  Output 500 ml  Net -107.89 ml   Exam Gen:- Awake Alert, no acute distress  HEENT:- [place].AT, No sclera icterus Neck-Supple Neck,No JVD,.  Lungs-  CTAB , good air movement bilaterally  CV- S1, S2 normal, regular Abd-  +ve B.Sounds, Abd Soft, much less distended after paracentesis Extremity/Skin:- No  edema,   good pulses Psych-affect  is appropriate, oriented x3, no further confusional episodes Neuro-generalized weakness, no new focal deficits, much improved tremors    Data Review    CBC w Diff:  Lab Results  Component Value Date   WBC 7.2 07/04/2019   HGB 12.2 07/04/2019   HCT 41.0 07/04/2019   PLT 139 (L) 07/04/2019   LYMPHOPCT 10 06/27/2019   MONOPCT 9 06/27/2019   EOSPCT 1 06/27/2019   BASOPCT 1 06/27/2019    CMP:  Lab Results  Component Value Date   NA 137 07/04/2019   K 4.0 07/04/2019   CL 108 07/04/2019   CO2 21 (L) 07/04/2019   BUN 74 (H) 07/04/2019   CREATININE 2.23 (H) 07/04/2019   CREATININE 1.06 (H) 05/31/2019   PROT 5.8 (L) 07/03/2019   ALBUMIN 2.7 (L) 07/04/2019   BILITOT 0.8 07/03/2019   ALKPHOS 93 07/03/2019   AST 29 07/03/2019   ALT 18 07/03/2019  .   Total Discharge time is about 33 minutes  Roxan Hockey M.D on 07/04/2019 at 2:02 PM  Go to [email or web address] -  for contact info  [facility] - Office  [phone]

## 2019-07-04 NOTE — Progress Notes (Signed)
Patient ID: Penny Martinez, female   DOB: 22-May-1951, 68 y.o.   MRN: [id number] [facility] Progress Note   Assessment/ Plan:   1.  Acute kidney injury on chronic kidney disease stage III: This appears to be hemodynamically mediated with preceding use of ACE inhibitor/diuretic and recent high-volume paracentesis. Labs today continue to show gradual renal recovery with downtrending creatinine and urine electrolytes yesterday suggestive of decreased effective arterial blood volume/hepatorenal physiology.  Will give additional intravenous albumin today to help based on renal recovery.  Do not restart furosemide until creatinine is back to baseline of~1.4.  She will continue to follow with Dr. Juleen China of [place] as an outpatient. 2.  Altered mental status/asterixis: Likely hepatic encephalopathy based on elevated ammonia level, improved with lactulose. 3.  Hypertension:  Blood pressure remains under reasonable control, continue hold off diuretics at this time until renal function is back to baseline.  Consider complete cessation of ACE inhibitor with hemodynamic flux from hepatorenal physiology and propensity for AKI. 4.  NASH cirrhosis with generalized weakness/deconditioning: Plans in place for likely admission to skilled nursing facility today.  She we will continue to follow with Dr. Gala Romney of gastroenterology as an outpatient. 5.  Morbid obesity  Subjective:   Reports to be feeling grumpy this morning and would like some ice water.  Overall feels better after 7 L paracentesis yesterday.   Objective:   BP (!) 101/52 (BP Location: Right Arm)   Pulse 90   Temp (!) 97.4 F (36.3 C)   Resp 16   Ht 5' 1"  (1.549 m)   Wt 106.7 kg   SpO2 98%   BMI 44.45 kg/m   Intake/Output Summary (Last 24 hours) at 07/04/2019 0837 Last data filed at 07/04/2019 0300 Gross per 24 hour  Intake 392.11 ml  Output 500 ml  Net -107.89 ml   Weight change: -3.5 kg  Physical Exam: Gen: Comfortably  sitting up in recliner, eating breakfast CVS: Pulse regular rhythm, normal rate, S1 and S2 normal Resp: Poor breath sounds over bases, clear to auscultation without distinct rales Abd: Moderate generalized distention with no tenderness.  Bowel sounds normal. Ext: 2+ lower extremity edema.  Diffuse erythematous macules.  Imaging: US Renal  Result Date: 07/03/2019 CLINICAL DATA:  Acute on chronic renal failure EXAM: RENAL / URINARY TRACT ULTRASOUND COMPLETE COMPARISON:  08/11/2018 FINDINGS: Right Kidney: Renal measurements: 12.1 x 4.2 x 6.3 cm = volume: 166 mL . Echogenicity within normal limits. No mass or hydronephrosis visualized. Left Kidney: Renal measurements: 9.9 x 4.6 x 4.2 cm = volume: 100 mL. Examination is somewhat limited secondary to poor acoustic windows. Echogenicity appears within normal limits. No mass or hydronephrosis visualized. Bladder: Appears normal for degree of bladder distention. Other: Small volume free fluid within the upper abdomen. IMPRESSION: 1. Negative for obstructive uropathy. 2. Small volume residual free fluid within the upper abdomen. Electronically Signed   By: Davina Poke M.D.   On: 07/03/2019 12:44   US Paracentesis  Result Date: 07/03/2019 INDICATION: Ascites EXAM: ULTRASOUND GUIDED  PARACENTESIS MEDICATIONS: None. COMPLICATIONS: None immediate. PROCEDURE: Informed written consent was obtained from the patient after a discussion of the risks, benefits and alternatives to treatment. A timeout was performed prior to the initiation of the procedure. Initial ultrasound scanning demonstrates a large amount of ascites within the right lower abdominal quadrant. The right lower abdomen was prepped and draped in the usual sterile fashion. 1% lidocaine was used for local anesthesia. Following this, a 19 gauge, 7-cm,  Yueh catheter was introduced. An ultrasound image was saved for documentation purposes. The paracentesis was performed. The catheter was removed and a  dressing was applied. The patient tolerated the procedure well without immediate post procedural complication. FINDINGS: A total of approximately 7 liters of serosanguineous fluid was removed. IMPRESSION: Successful ultrasound-guided paracentesis yielding 7 liters of peritoneal fluid. Electronically Signed   By: Rolm Baptise M.D.   On: 07/03/2019 12:32    Labs: BMET Recent Labs  Lab 06/27/19 1334 06/28/19 0855 06/29/19 0548 06/30/19 0603 07/01/19 0709 07/02/19 1122 07/03/19 0849 07/04/19 0654  NA 129* 133* 132* 136 138 135 137 137  K 6.0* 6.1* 5.4* 4.4 4.6 4.4 4.2 4.0  CL 96* 100 101 104 105 106 106 108  CO2 21* 18* 20* 19* 20* 21* 20* 21*  GLUCOSE 150* 185* 272* 219* 232* 239* 172* 142*  BUN 89* 90* 91* 88* 96* 92* 80* 74*  CREATININE 3.86* 3.48* 3.37* 2.93* 2.71* 2.76* 2.54* 2.23*  CALCIUM 8.3* 8.3* 7.5* 7.9* 8.3* 7.9* 8.2* 8.1*  PHOS 6.8*  --   --   --   --  5.4*  --  3.6   CBC Recent Labs  Lab 06/27/19 1334 06/29/19 0548 07/01/19 0709 07/04/19 0654  WBC 8.7 6.6 9.6 7.2  NEUTROABS 6.9  --   --   --   HGB 12.1 11.2* 11.2* 12.2  HCT 40.7 36.9 37.5 41.0  MCV 86.0 86.0 87.2 87.2  PLT 183 181 168 139*    Medications:    . amitriptyline  25 mg Oral QHS  . aspirin EC  81 mg Oral Q breakfast  . Chlorhexidine Gluconate Cloth  6 each Topical Daily  . donepezil  5 mg Oral QHS  . ezetimibe  10 mg Oral q morning - 10a  . ferrous sulfate  325 mg Oral Daily  . gabapentin  100 mg Oral TID  . heparin injection (subcutaneous)  5,000 Units Subcutaneous Q8H  . insulin aspart  0-15 Units Subcutaneous TID WC  . insulin glargine  24 Units Subcutaneous BID  . lactulose  30 g Oral BID  . levothyroxine  100 mcg Oral Daily  . liver oil-zinc oxide   Topical BID  . pantoprazole  40 mg Oral Daily    Elmarie Shiley, MD 07/04/2019, 8:37 AM

## 2019-07-04 NOTE — Care Management Important Message (Addendum)
   Recommendations for primary care physician for things to follow:  - 1)Avoid ibuprofen/Advil/Aleve/Motrin/Goody Powders/Naproxen/BC powders /Meloxicam/Diclofenac/Indomethacin and other Nonsteroidal anti-inflammatory medications as these will make you more likely to bleed and can cause stomach ulcers, can also cause Kidney problems.   2)Stop Benazepril, avoid ACE inhibitors or angiotensin receptor blockers class of medications ( ACEI/ARB/ARNI) due to kidney concerns  3)Please do NOT restart diuretics including Torsemide or Aldactone until creatinine is at or below 1.4  4)Repeat BMP (kidney and electrolyte test) and CBC blood test on Friday, 07/07/2019  5)Please follow-up with your kidney doctor/nephrologist- with Dr. Juleen China of [place] as an outpatient-after you leave rehab  6)Please follow-up with your liver doctor/gastroenterologist- Dr. Raul Del you leave rehab  7)You will need paracentesis/removal of fluid from your belly from time to time most likely every couple of weeks  8) avoid constipation as this will lead to hepatic encephalopathy which will make you confused, sleepy and disoriented -Goal is to have 2 to 3 semisolid/mushy stools per day--you need to take enough lactulose to avoid constipation  Roxan Hockey, MD

## 2019-07-04 NOTE — Progress Notes (Signed)
Report called to [facility] and [facility].  IV removed.  Scripts in packet

## 2019-07-04 NOTE — Clinical Social Work Note (Deleted)
Penny, Martinez #[id number] (CSN: [id number]) (68 y.o. F) (Adm: 06/27/19) [id number] All medication doses during the admission are shown, including meds that are no longer on order.  Scheduled Meds Sorted by Name for Penny Martinez, Penny Martinez as of 07/04/19 1528  1 Day 3 Days 7 Days 10 Days <  Today >    Legend:                    Discontinued    Completed    Future    MAR Hold    Linked        Medications 07/01/19 07/02/19 07/03/19 07/04/19  amitriptyline (ELAVIL) tablet 25 mg  Dose: 25 mg Freq: Daily at bedtime Route: PO Start: 06/27/19 2200   2101     2238     2230     2200      aspirin EC tablet 81 mg  Dose: 81 mg Freq: Daily with breakfast Route: PO Start: 06/28/19 0800   0924     0801     0858     0927       Dose: 300 mg Freq: Once Route: PO Start: 07/04/19 1315 End: 07/04/19 1417   Admin Instructions:  Give 2 hours before or after antacids or iron supplements.      1417      Chlorhexidine Gluconate Cloth 2 % PADS 6 each  Dose: 6 each Freq: Daily Route: TP Start: 06/28/19 1000   Admin Instructions:  Apply chlorhexidine with firm massage to remove bacteria. Skin may feel sticky for a few minutes after application. Do NOT wipe off.Allow to air dry. 6 cloth bath instructions: 1. Neck, shoulders, and chest 2. Both arms and hands 3. Abdomen and groin 4. Right leg and foot 5. Left leg and foot 6. Back and buttocks   0930     1000     0902     0943      donepezil (ARICEPT) tablet 5 mg  Dose: 5 mg Freq: Daily at bedtime Route: PO Start: 06/27/19 2200   2101     2238     2230     2200      ezetimibe (ZETIA) tablet 10 mg  Dose: 10 mg Freq: Every morning - 10a Route: PO Start: 06/27/19 1500   0924     1139     0858     0928      ferrous sulfate tablet 325 mg  Dose: 325 mg Freq: Daily Route: PO Start: 06/27/19 1500   0924     1139     0857     0928       gabapentin (NEURONTIN) capsule 100 mg  Dose: 100 mg Freq: 3 times daily Route: PO Start: 06/27/19 1600   0924  1526   2101     1139  1558   2238     0857  1644   2230     0943  1510   2200      heparin injection 5,000 Units  Dose: 5,000 Units Freq: Every 8 hours Route: [place] Start: 06/28/19 2200   0547  1323   2100     0532  1507   2238     0525  1644   2231     0504  1417   2200      insulin aspart (novoLOG) injection 0-15 Units  Dose: 0-15 Units Freq: 3 times daily with meals Route:  Start: 06/27/19 1700  Admin Instructions:  Do NOT hold if patient is NPO. Moderate Scale.   Order specific questions:  Correction coverage: Moderate (average weight, post-op) CBG < 70: Implement Hypoglycemia Standing Orders and refer to Hypoglycemia Standing Orders sidebar report CBG 70 - 120: 0 units CBG 121 - 150: 2 units CBG 151 - 200: 3 units CBG 201 - 250: 5 units CBG 251 - 300: 8 units CBG 301 - 350: 11 units CBG 351 - 400: 15 units CBG > 400 call MD and obtain STAT lab verification   7124  5809   9833     8250  5397   6734     1937  9024   0973     5329  9242   1700       Dose: 20 Units Freq: 2 times daily Route: [place] Start: 06/27/19 2200 End: 06/30/19 1755   Admin Instructions:  **DO NOT MIX WITH OTHER INSULINS**         Dose: 22 Units Freq: 2 times daily Route: [place] Start: 06/30/19 2200 End: 07/02/19 1608   Admin Instructions:  **DO NOT MIX WITH OTHER INSULINS**   0923  2103    1226  1608-D/C'd      insulin glargine (LANTUS) injection 24 Units  Dose: 24 Units Freq: 2 times daily Route: [place] Start: 07/02/19 2200   Admin Instructions:  **DO NOT MIX WITH OTHER INSULINS**    2300     0858  2230    0926  2200     lactulose (CHRONULAC) 10 GM/15ML solution 30 g  Dose: 30 g Freq: 2 times daily Route: PO Start: 06/27/19 2200   0924  2101    1139  2240[C]    0858  2231    0927  2200       Dose: 30 g Freq: STAT Route: PO Start: 06/27/19 1430 End: 06/27/19 1442        levothyroxine (SYNTHROID) tablet 100 mcg  Dose: 100 mcg Freq: Daily Route: PO Start: 06/28/19 0600   Admin Instructions:  Give on an empty stomach, at least 30 minutes before eating.   [place]      liver oil-zinc oxide (DESITIN) 40 % ointment  Freq: 2 times daily Route: TP Start: 06/30/19 1800   Admin Instructions:  Apply to perineum and buttocks--   0007  0930   1721     1558  1658    0904  1648    0939  1700     pantoprazole (PROTONIX) EC tablet 40 mg  Dose: 40 mg Freq: Daily Route: PO Start: 06/27/19 1600   0924     1139     0858     0928       Dose: 25 mEq Freq: Once Route: IV Start: 06/27/19 1745 End: 06/27/19 1806         Dose: 25 mEq Freq: Once Route: IV Start: 06/27/19 1815 End: 06/28/19 0023         Dose: 30 g Freq: Once Route: PO Start: 06/29/19 0830 End: 06/29/19 1034         Dose: 30 g Freq: Once Route: PO Start: 06/28/19 1830 End: 06/28/19 1845         Dose: 0.4 mg Freq: Once Route: PO Start: 07/01/19 1530 End: 07/01/19 1526   1526         Medications 07/01/19 07/02/19 07/03/19 07/04/19  Continuous Meds Sorted by Name for Penny Martinez, Penny Martinez as of 07/04/19 1528  Legend:                    Inactive    Active    Linked            Medications 07/01/19 07/02/19 07/03/19 07/04/19  0.9 % sodium chloride infusion  Rate: 30 mL/hr Freq: Continuous Route: IV Start: 06/28/19 1830 End: 07/03/19 0908   0250  1528     0900  0908-D/C'd     0.9 % sodium chloride infusion  Freq: Once Route: IV Last Dose: Stopped (06/28/19 0924) Start: 06/27/19 1430 End: 06/28/19 0924        albumin human 25 % solution 50 g  Dose: 50 g Freq: Once Route: IV Start: 07/05/19 0800        albumin human 25 % solution 50 g  Dose: 50 g Freq: Once Route: IV Last Dose: 50 g (07/04/19 1057) Start: 07/04/19  0845 End: 07/04/19 1057      1057      albumin human 25 % solution 50 g  Dose: 50 g Freq: Once Route: IV Last Dose: 50 g (07/03/19 1018) Start: 07/03/19 0915 End: 07/03/19 1018     1018       calcium gluconate 1 g/ 50 mL sodium chloride IVPB  Dose: 1 g Freq: Once Route: IV Last Dose: Stopped (06/27/19 2014) Start: 06/27/19 1600 End: 06/27/19 2014        cefTRIAXone (ROCEPHIN) 1 g in sodium chloride 0.9 % 100 mL IVPB  Dose: 1 g Freq: Every 24 hours Route: IV Last Dose: 1 g (07/03/19 2010) Start: 07/03/19 1900 End: 07/04/19 1301   Order specific questions:  Antibiotic Indication: UTI     2010     1301-D/C'd     magnesium sulfate IVPB 2 g 50 mL  Dose: 2 g Freq: STAT Route: IV Start: 06/27/19 1430 End: 06/27/19 1434        potassium chloride 10 mEq in 100 mL IVPB  Dose: 10 mEq Freq: Once Route: IV Start: 06/27/19 1430 End: 06/27/19 1434        Medications 07/01/19 07/02/19 07/03/19 07/04/19    PRN Meds Sorted by Name for Penny Martinez as of 07/04/19 1528  Legend:                    Inactive    Active    Linked            Medications 07/01/19 07/02/19 07/03/19 07/04/19  acetaminophen (TYLENOL) tablet 650 mg  Dose: 650 mg Freq: Every 6 hours PRN Route: PO PRN Reason: mild pain Start: 07/03/19 0011     0032     0943      azelastine (ASTELIN) 0.1 % nasal spray 1 spray  Dose: 1 spray Freq: As needed Route: EACH NARE PRN Comment: for congestion Start: 06/27/19 1452        ondansetron (ZOFRAN) tablet 4 mg  Dose: 4 mg Freq: Every 6 hours PRN Route: PO PRN Reason: nausea Start: 06/27/19 1459     1954[C]     0943      Or ondansetron (ZOFRAN) injection 4 mg  Dose: 4 mg Freq: Every 6 hours PRN Route: IV PRN Reason: nausea Start: 06/27/19 1459    0445           oxyCODONE (Oxy IR/ROXICODONE) immediate release tablet 5 mg  Dose: 5 mg Freq: Every  4 hours PRN Route: PO PRN Reason: moderate pain Start: 06/29/19 1158     0910        Medications 07/01/19 07/02/19 07/03/19 07/04/19

## 2019-07-04 NOTE — TOC Transition Note (Signed)
Transition of Care [facility]) - CM/SW Discharge Note   Patient Details  Name: Penny Martinez MRN: [id number] Date of Birth: 03/30/1951  [facility] CM/SW Contact:  Boneta Lucks, RN Phone Number: 07/04/2019, 2:26 PM   Clinical Narrative:   Patient discharging to [facility] [place] [facility] for transport. Called Gwyndolyn Saxon- husband to update him. He has already been to the facility to sign and take belongings. RN updated and calling report.     Final next level of care: Skilled Nursing Facility Barriers to Discharge: Barriers Resolved   Patient Goals and CMS Choice Patient states their goals for this hospitalization and ongoing recovery are:: To go to SNF and return home. CMS Medicare.gov Compare Post Acute Care list provided to:: Patient Represenative (must comment) Choice offered to / list presented to : Spouse  Discharge Placement              Patient chooses bed at: [facility] Patient to be transferred to facility by: EMS Name of family member notified: Gwyndolyn Saxon - husband Patient and family notified of of transfer: 07/04/19  Discharge Plan and Services In-house Referral: Clinical Social Work             Social Determinants of Health ([place]) Interventions     Readmission Risk Interventions Readmission Risk Prevention Plan 06/28/2019 06/15/2019 06/14/2019  Transportation Screening Complete - Complete  PCP or Specialist Appt within 3-5 Days - Complete -  HRI or Home Care Consult Complete Complete Complete  Social Work Consult for [place] Planning/Counseling Complete - Complete  Palliative Care Screening Not Applicable - Not Applicable  Medication Review Press photographer) Complete - Complete  Some recent data might be hidden

## 2019-07-04 NOTE — Discharge Instructions (Signed)
- °  1)Avoid ibuprofen/Advil/Aleve/Motrin/Goody Powders/Naproxen/BC powders /Meloxicam/Diclofenac/Indomethacin and other Nonsteroidal anti-inflammatory medications as these will make you more likely to bleed and can cause stomach ulcers, can also cause Kidney problems.   2)Stop Benazepril, avoid ACE inhibitors or angiotensin receptor blockers class of medications ( ACEI/ARB/ARNI) due to kidney concerns  3)Please do NOT restart diuretics including Torsemide or Aldactone until creatinine is at or below 1.4  4)Repeat BMP (kidney and electrolyte test) and CBC blood test on Friday, 07/07/2019  5)Please follow-up with your kidney doctor/nephrologist- with Dr. Juleen China of [place] as an outpatient-after you leave rehab  6)Please follow-up with your liver doctor/gastroenterologist- Dr. Raul Del you leave rehab  7)You will need paracentesis/removal of fluid from your belly from time to time most likely every couple of weeks  8) avoid constipation as this will lead to hepatic encephalopathy which will make you confused, sleepy and disoriented -Goal is to have 2 to 3 semisolid/mushy stools per day--you need to take enough lactulose to avoid constipation  9)insulin aspart (novoLOG) injection 0-9 Units 0-9 Units,  Subcutaneous, 3 times daily with meals, CBG < 70: Implement Hypoglycemia Standing Orders and refer to Hypoglycemia Standing Orders sidebar report  CBG 70 - 120: 0 units  CBG 121 - 150: 1 unit  CBG 151 - 200: 2 units  CBG 201 - 250: 3 units  CBG 251 - 300: 5 units  CBG 301 - 350: 7 units  CBG 351 - 400: 9 units CBG > 400:

## 2019-07-05 LAB — URINE CULTURE: Special Requests: NORMAL

## 2019-07-10 ENCOUNTER — Other Ambulatory Visit: Payer: Self-pay | Admitting: Internal Medicine

## 2019-07-10 ENCOUNTER — Other Ambulatory Visit (HOSPITAL_COMMUNITY): Payer: Self-pay | Admitting: Internal Medicine

## 2019-07-10 DIAGNOSIS — R188 Other ascites: Secondary | ICD-10-CM

## 2019-07-12 ENCOUNTER — Other Ambulatory Visit: Payer: Self-pay

## 2019-07-12 ENCOUNTER — Encounter (HOSPITAL_COMMUNITY): Payer: Self-pay

## 2019-07-12 ENCOUNTER — Encounter (HOSPITAL_COMMUNITY): Payer: Self-pay | Admitting: *Deleted

## 2019-07-12 ENCOUNTER — Inpatient Hospital Stay (HOSPITAL_COMMUNITY)
Admission: EM | Admit: 2019-07-12 | Discharge: 2019-07-17 | DRG: 815 | Disposition: A | Discharge status: Skilled Nursing Facility | Payer: Medicare Other | Source: Skilled Nursing Facility | Attending: Internal Medicine | Admitting: Internal Medicine

## 2019-07-12 ENCOUNTER — Ambulatory Visit (HOSPITAL_COMMUNITY): Admission: RE | Admit: 2019-07-12 | Payer: Medicare Other | Source: Ambulatory Visit

## 2019-07-12 ENCOUNTER — Emergency Department (HOSPITAL_COMMUNITY): Payer: Medicare Other

## 2019-07-12 DIAGNOSIS — E1122 Type 2 diabetes mellitus with diabetic chronic kidney disease: Secondary | ICD-10-CM | POA: Diagnosis not present

## 2019-07-12 DIAGNOSIS — R918 Other nonspecific abnormal finding of lung field: Secondary | ICD-10-CM | POA: Diagnosis present

## 2019-07-12 DIAGNOSIS — Z885 Allergy status to narcotic agent status: Secondary | ICD-10-CM

## 2019-07-12 DIAGNOSIS — R0902 Hypoxemia: Secondary | ICD-10-CM | POA: Diagnosis present

## 2019-07-12 DIAGNOSIS — E038 Other specified hypothyroidism: Secondary | ICD-10-CM | POA: Diagnosis present

## 2019-07-12 DIAGNOSIS — Z794 Long term (current) use of insulin: Secondary | ICD-10-CM

## 2019-07-12 DIAGNOSIS — E782 Mixed hyperlipidemia: Secondary | ICD-10-CM | POA: Diagnosis present

## 2019-07-12 DIAGNOSIS — Z833 Family history of diabetes mellitus: Secondary | ICD-10-CM

## 2019-07-12 DIAGNOSIS — Z880 Allergy status to penicillin: Secondary | ICD-10-CM

## 2019-07-12 DIAGNOSIS — I132 Hypertensive heart and chronic kidney disease with heart failure and with stage 5 chronic kidney disease, or end stage renal disease: Secondary | ICD-10-CM | POA: Diagnosis present

## 2019-07-12 DIAGNOSIS — Z8249 Family history of ischemic heart disease and other diseases of the circulatory system: Secondary | ICD-10-CM

## 2019-07-12 DIAGNOSIS — I5032 Chronic diastolic (congestive) heart failure: Secondary | ICD-10-CM | POA: Diagnosis present

## 2019-07-12 DIAGNOSIS — D6959 Other secondary thrombocytopenia: Secondary | ICD-10-CM | POA: Diagnosis present

## 2019-07-12 DIAGNOSIS — Z79899 Other long term (current) drug therapy: Secondary | ICD-10-CM

## 2019-07-12 DIAGNOSIS — I4510 Unspecified right bundle-branch block: Secondary | ICD-10-CM | POA: Diagnosis present

## 2019-07-12 DIAGNOSIS — R161 Splenomegaly, not elsewhere classified: Secondary | ICD-10-CM | POA: Diagnosis present

## 2019-07-12 DIAGNOSIS — Z20828 Contact with and (suspected) exposure to other viral communicable diseases: Secondary | ICD-10-CM | POA: Diagnosis present

## 2019-07-12 DIAGNOSIS — K7460 Unspecified cirrhosis of liver: Secondary | ICD-10-CM | POA: Diagnosis not present

## 2019-07-12 DIAGNOSIS — Z888 Allergy status to other drugs, medicaments and biological substances status: Secondary | ICD-10-CM

## 2019-07-12 DIAGNOSIS — Z882 Allergy status to sulfonamides status: Secondary | ICD-10-CM

## 2019-07-12 DIAGNOSIS — Z7982 Long term (current) use of aspirin: Secondary | ICD-10-CM

## 2019-07-12 DIAGNOSIS — R278 Other lack of coordination: Secondary | ICD-10-CM | POA: Diagnosis present

## 2019-07-12 DIAGNOSIS — R1084 Generalized abdominal pain: Secondary | ICD-10-CM | POA: Diagnosis not present

## 2019-07-12 DIAGNOSIS — Z87442 Personal history of urinary calculi: Secondary | ICD-10-CM

## 2019-07-12 DIAGNOSIS — K7581 Nonalcoholic steatohepatitis (NASH): Secondary | ICD-10-CM | POA: Diagnosis present

## 2019-07-12 DIAGNOSIS — L89316 Pressure-induced deep tissue damage of right buttock: Secondary | ICD-10-CM | POA: Diagnosis present

## 2019-07-12 DIAGNOSIS — Z87891 Personal history of nicotine dependence: Secondary | ICD-10-CM

## 2019-07-12 DIAGNOSIS — D509 Iron deficiency anemia, unspecified: Secondary | ICD-10-CM | POA: Diagnosis present

## 2019-07-12 DIAGNOSIS — E1142 Type 2 diabetes mellitus with diabetic polyneuropathy: Secondary | ICD-10-CM | POA: Diagnosis present

## 2019-07-12 DIAGNOSIS — K652 Spontaneous bacterial peritonitis: Secondary | ICD-10-CM | POA: Diagnosis present

## 2019-07-12 DIAGNOSIS — E871 Hypo-osmolality and hyponatremia: Secondary | ICD-10-CM | POA: Diagnosis present

## 2019-07-12 DIAGNOSIS — Z881 Allergy status to other antibiotic agents status: Secondary | ICD-10-CM

## 2019-07-12 DIAGNOSIS — K219 Gastro-esophageal reflux disease without esophagitis: Secondary | ICD-10-CM | POA: Diagnosis present

## 2019-07-12 DIAGNOSIS — R109 Unspecified abdominal pain: Secondary | ICD-10-CM | POA: Diagnosis present

## 2019-07-12 DIAGNOSIS — I898 Other specified noninfective disorders of lymphatic vessels and lymph nodes: Secondary | ICD-10-CM | POA: Diagnosis present

## 2019-07-12 DIAGNOSIS — Z841 Family history of disorders of kidney and ureter: Secondary | ICD-10-CM

## 2019-07-12 DIAGNOSIS — E7220 Disorder of urea cycle metabolism, unspecified: Secondary | ICD-10-CM | POA: Diagnosis present

## 2019-07-12 DIAGNOSIS — Z6841 Body Mass Index (BMI) 40.0 and over, adult: Secondary | ICD-10-CM

## 2019-07-12 DIAGNOSIS — IMO0002 Reserved for concepts with insufficient information to code with codable children: Secondary | ICD-10-CM | POA: Diagnosis present

## 2019-07-12 DIAGNOSIS — J449 Chronic obstructive pulmonary disease, unspecified: Secondary | ICD-10-CM | POA: Diagnosis present

## 2019-07-12 DIAGNOSIS — N185 Chronic kidney disease, stage 5: Secondary | ICD-10-CM | POA: Diagnosis present

## 2019-07-12 DIAGNOSIS — K7290 Hepatic failure, unspecified without coma: Secondary | ICD-10-CM | POA: Diagnosis present

## 2019-07-12 DIAGNOSIS — I8510 Secondary esophageal varices without bleeding: Secondary | ICD-10-CM | POA: Diagnosis present

## 2019-07-12 DIAGNOSIS — R188 Other ascites: Secondary | ICD-10-CM | POA: Diagnosis not present

## 2019-07-12 DIAGNOSIS — E1165 Type 2 diabetes mellitus with hyperglycemia: Secondary | ICD-10-CM | POA: Diagnosis present

## 2019-07-12 DIAGNOSIS — Z79890 Hormone replacement therapy (postmenopausal): Secondary | ICD-10-CM

## 2019-07-12 DIAGNOSIS — Z85828 Personal history of other malignant neoplasm of skin: Secondary | ICD-10-CM

## 2019-07-12 DIAGNOSIS — Z90710 Acquired absence of both cervix and uterus: Secondary | ICD-10-CM

## 2019-07-12 LAB — CBC WITH DIFFERENTIAL/PLATELET
Abs Immature Granulocytes: 0.07 10*3/uL (ref 0.00–0.07)
Basophils Absolute: 0 10*3/uL (ref 0.0–0.1)
Basophils Relative: 1 %
Eosinophils Absolute: 0.1 10*3/uL (ref 0.0–0.5)
Eosinophils Relative: 1 %
HCT: 36.8 % (ref 36.0–46.0)
Hemoglobin: 11.6 g/dL — ABNORMAL LOW (ref 12.0–15.0)
Immature Granulocytes: 1 %
Lymphocytes Relative: 8 %
Lymphs Abs: 0.7 10*3/uL (ref 0.7–4.0)
MCH: 26.7 pg (ref 26.0–34.0)
MCHC: 31.5 g/dL (ref 30.0–36.0)
MCV: 84.6 fL (ref 80.0–100.0)
Monocytes Absolute: 0.9 10*3/uL (ref 0.1–1.0)
Monocytes Relative: 11 %
Neutro Abs: 6.7 10*3/uL (ref 1.7–7.7)
Neutrophils Relative %: 78 %
Platelets: 140 10*3/uL — ABNORMAL LOW (ref 150–400)
RBC: 4.35 MIL/uL (ref 3.87–5.11)
RDW: 22.7 % — ABNORMAL HIGH (ref 11.5–15.5)
WBC: 8.5 10*3/uL (ref 4.0–10.5)
nRBC: 0 % (ref 0.0–0.2)

## 2019-07-12 LAB — GLUCOSE, PLEURAL OR PERITONEAL FLUID: Glucose, Fluid: 297 mg/dL

## 2019-07-12 LAB — GLUCOSE, CAPILLARY
Glucose-Capillary: 219 mg/dL — ABNORMAL HIGH (ref 70–99)
Glucose-Capillary: 274 mg/dL — ABNORMAL HIGH (ref 70–99)
Glucose-Capillary: 370 mg/dL — ABNORMAL HIGH (ref 70–99)

## 2019-07-12 LAB — COMPREHENSIVE METABOLIC PANEL
ALT: 19 U/L (ref 0–44)
AST: 29 U/L (ref 15–41)
Albumin: 2.7 g/dL — ABNORMAL LOW (ref 3.5–5.0)
Alkaline Phosphatase: 114 U/L (ref 38–126)
Anion gap: 10 (ref 5–15)
BUN: 72 mg/dL — ABNORMAL HIGH (ref 8–23)
CO2: 21 mmol/L — ABNORMAL LOW (ref 22–32)
Calcium: 8.1 mg/dL — ABNORMAL LOW (ref 8.9–10.3)
Chloride: 98 mmol/L (ref 98–111)
Creatinine, Ser: 2.49 mg/dL — ABNORMAL HIGH (ref 0.44–1.00)
GFR calc Af Amer: 22 mL/min — ABNORMAL LOW (ref >60)
GFR calc non Af Amer: 19 mL/min — ABNORMAL LOW (ref >60)
Glucose, Bld: 308 mg/dL — ABNORMAL HIGH (ref 70–99)
Potassium: 4.2 mmol/L (ref 3.5–5.1)
Sodium: 129 mmol/L — ABNORMAL LOW (ref 135–145)
Total Bilirubin: 0.6 mg/dL (ref 0.3–1.2)
Total Protein: 5.9 g/dL — ABNORMAL LOW (ref 6.5–8.1)

## 2019-07-12 LAB — BRAIN NATRIURETIC PEPTIDE: B Natriuretic Peptide: 40 pg/mL (ref 0.0–100.0)

## 2019-07-12 LAB — BODY FLUID CELL COUNT WITH DIFFERENTIAL
Eos, Fluid: 0 %
Lymphs, Fluid: 57 %
Monocyte-Macrophage-Serous Fluid: 38 % — ABNORMAL LOW (ref 50–90)
Neutrophil Count, Fluid: 5 % (ref 0–25)
Total Nucleated Cell Count, Fluid: 328 cu mm (ref 0–1000)

## 2019-07-12 LAB — SARS CORONAVIRUS 2 (TAT 6-24 HRS): SARS Coronavirus 2: NEGATIVE

## 2019-07-12 LAB — ALBUMIN, PLEURAL OR PERITONEAL FLUID: Albumin, Fluid: 1 g/dL

## 2019-07-12 LAB — PROTEIN, PLEURAL OR PERITONEAL FLUID: Total protein, fluid: <3 g/dL

## 2019-07-12 LAB — AMMONIA: Ammonia: 116 umol/L — ABNORMAL HIGH (ref 9–35)

## 2019-07-12 LAB — LACTATE DEHYDROGENASE, PLEURAL OR PERITONEAL FLUID: LD, Fluid: 67 U/L — ABNORMAL HIGH (ref 3–23)

## 2019-07-12 LAB — PROTIME-INR
INR: 1.4 — ABNORMAL HIGH (ref 0.8–1.2)
Prothrombin Time: 17 seconds — ABNORMAL HIGH (ref 11.4–15.2)

## 2019-07-12 MED ORDER — ALBUMIN HUMAN 25 % IV SOLN
0.0000 g | Freq: Once | INTRAVENOUS | Status: AC
  Administered 2019-07-12: 05:00:00 12.5 g via INTRAVENOUS
  Filled 2019-07-12: qty 400

## 2019-07-12 MED ORDER — LACTULOSE 10 GM/15ML PO SOLN
30.0000 g | Freq: Two times a day (BID) | ORAL | Status: DC
  Administered 2019-07-12 – 2019-07-16 (×10): 30 g via ORAL
  Filled 2019-07-12 (×10): qty 60

## 2019-07-12 MED ORDER — ASPIRIN EC 81 MG PO TBEC
81.0000 mg | DELAYED_RELEASE_TABLET | Freq: Every day | ORAL | Status: DC
  Administered 2019-07-12 – 2019-07-16 (×5): 81 mg via ORAL
  Filled 2019-07-12 (×5): qty 1

## 2019-07-12 MED ORDER — ONDANSETRON HCL 4 MG/2ML IJ SOLN: 4.0000 mg | Freq: Four times a day (QID) | INTRAMUSCULAR | Status: DC | PRN

## 2019-07-12 MED ORDER — ONDANSETRON HCL 4 MG PO TABS: 4.0000 mg | ORAL_TABLET | Freq: Four times a day (QID) | ORAL | Status: DC | PRN

## 2019-07-12 MED ORDER — GABAPENTIN 100 MG PO CAPS
100.0000 mg | ORAL_CAPSULE | Freq: Every day | ORAL | Status: DC
  Administered 2019-07-12 – 2019-07-16 (×5): 100 mg via ORAL
  Filled 2019-07-12 (×5): qty 1

## 2019-07-12 MED ORDER — LIRAGLUTIDE 18 MG/3ML ~~LOC~~ SOPN: 1.8000 mg | PEN_INJECTOR | Freq: Every day | SUBCUTANEOUS | Status: DC

## 2019-07-12 MED ORDER — AMITRIPTYLINE HCL 25 MG PO TABS
25.0000 mg | ORAL_TABLET | Freq: Every day | ORAL | Status: DC
  Administered 2019-07-12 – 2019-07-16 (×5): 25 mg via ORAL
  Filled 2019-07-12 (×5): qty 1

## 2019-07-12 MED ORDER — ALBUMIN HUMAN 25 % IV SOLN
INTRAVENOUS | Status: AC
  Filled 2019-07-12: qty 200

## 2019-07-12 MED ORDER — LEVOTHYROXINE SODIUM 100 MCG PO TABS
100.0000 ug | ORAL_TABLET | Freq: Every day | ORAL | Status: DC
  Administered 2019-07-12 – 2019-07-17 (×6): 100 ug via ORAL
  Filled 2019-07-12 (×6): qty 1

## 2019-07-12 MED ORDER — INSULIN ASPART 100 UNIT/ML ~~LOC~~ SOLN
0.0000 [IU] | Freq: Three times a day (TID) | SUBCUTANEOUS | Status: DC
  Administered 2019-07-12: 5 [IU] via SUBCUTANEOUS
  Administered 2019-07-12: 3 [IU] via SUBCUTANEOUS
  Administered 2019-07-12: 9 [IU] via SUBCUTANEOUS
  Administered 2019-07-13: 12:00:00 5 [IU] via SUBCUTANEOUS
  Administered 2019-07-13: 09:00:00 3 [IU] via SUBCUTANEOUS
  Administered 2019-07-13 – 2019-07-14 (×2): 2 [IU] via SUBCUTANEOUS
  Administered 2019-07-14: 5 [IU] via SUBCUTANEOUS
  Administered 2019-07-14: 3 [IU] via SUBCUTANEOUS
  Administered 2019-07-15: 1 [IU] via SUBCUTANEOUS
  Administered 2019-07-15: 3 [IU] via SUBCUTANEOUS
  Administered 2019-07-15: 10:00:00 2 [IU] via SUBCUTANEOUS
  Administered 2019-07-16: 7 [IU] via SUBCUTANEOUS
  Administered 2019-07-16: 3 [IU] via SUBCUTANEOUS
  Administered 2019-07-16: 5 [IU] via SUBCUTANEOUS

## 2019-07-12 MED ORDER — DONEPEZIL HCL 5 MG PO TABS
5.0000 mg | ORAL_TABLET | Freq: Every day | ORAL | Status: DC
  Administered 2019-07-12 – 2019-07-16 (×5): 5 mg via ORAL
  Filled 2019-07-12 (×5): qty 1

## 2019-07-12 MED ORDER — INSULIN GLARGINE 100 UNIT/ML ~~LOC~~ SOLN
24.0000 [IU] | Freq: Two times a day (BID) | SUBCUTANEOUS | Status: DC
  Administered 2019-07-12 – 2019-07-16 (×10): 24 [IU] via SUBCUTANEOUS
  Filled 2019-07-12 (×13): qty 0.24

## 2019-07-12 MED ORDER — OXYCODONE HCL 5 MG PO TABS
5.0000 mg | ORAL_TABLET | ORAL | Status: DC | PRN
  Administered 2019-07-12 – 2019-07-16 (×6): 5 mg via ORAL
  Filled 2019-07-12 (×6): qty 1

## 2019-07-12 MED ORDER — EZETIMIBE 10 MG PO TABS
10.0000 mg | ORAL_TABLET | Freq: Every morning | ORAL | Status: DC
  Administered 2019-07-12 – 2019-07-16 (×5): 10 mg via ORAL
  Filled 2019-07-12 (×5): qty 1

## 2019-07-12 MED ORDER — PANTOPRAZOLE SODIUM 40 MG PO TBEC
40.0000 mg | DELAYED_RELEASE_TABLET | Freq: Every day | ORAL | Status: DC
  Administered 2019-07-12 – 2019-07-16 (×5): 40 mg via ORAL
  Filled 2019-07-12 (×5): qty 1

## 2019-07-12 MED ORDER — FERROUS SULFATE 325 (65 FE) MG PO TABS
325.0000 mg | ORAL_TABLET | Freq: Every day | ORAL | Status: DC
  Administered 2019-07-12 – 2019-07-16 (×5): 325 mg via ORAL
  Filled 2019-07-12 (×5): qty 1

## 2019-07-12 MED ORDER — INSULIN ASPART 100 UNIT/ML ~~LOC~~ SOLN
10.0000 [IU] | Freq: Once | SUBCUTANEOUS | Status: AC
  Administered 2019-07-12: 10 [IU] via SUBCUTANEOUS

## 2019-07-12 MED ORDER — SODIUM CHLORIDE 0.9 % IV SOLN
2.0000 g | INTRAVENOUS | Status: DC
  Administered 2019-07-12 – 2019-07-14 (×3): 2 g via INTRAVENOUS
  Filled 2019-07-12 (×3): qty 20

## 2019-07-12 MED ORDER — RIFAXIMIN 550 MG PO TABS
550.0000 mg | ORAL_TABLET | Freq: Two times a day (BID) | ORAL | Status: DC
  Administered 2019-07-13 – 2019-07-16 (×8): 550 mg via ORAL
  Filled 2019-07-12 (×8): qty 1

## 2019-07-12 NOTE — Progress Notes (Signed)
Patient CBG 447. Will make MD aware. Patient given her Lantus 24 units.

## 2019-07-12 NOTE — Progress Notes (Signed)
Cell count on fluid noted. Neutrophil count not consistent with spontaneous bacterial peritonitis. 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 Will check triglyceride level and peritoneal fluid.

## 2019-07-12 NOTE — H&P (Signed)
History and Physical    Penny Martinez QPR:[id number] DOB: Jan 28, 1951 DOA: 07/12/2019  PCP: Vidal Schwalbe, MD   Patient coming from: [facility]  I have personally briefly reviewed patient's old medical records in [place]  Chief Complaint: Hypoxia.  HPI: Penny Martinez is a 68 y.o. female with medical history significant for  gastroesophageal reflux disease, chronic diastolic heart failure, NASH/cirrhosis, morbid obesity, dyslipidemia, hypertension, type 2 diabetes mellitus with nephropathy, chronic kidney disease stage III, hypothyroidism and a sleep apnea; who presented to the emergency department with complaints of abdominal pain due to abdominal distention secondary to decompensated liver cirrhosis.  She denies nausea, emesis, constipation, melena or hematochezia.  No dysuria, frequency or hematuria.  She denies fever, chills, rhinorrhea, sore throat, productive cough, wheezing or hemoptysis.  She occasionally gets dyspneic.  She denies chest pain, palpitations, dizziness, diaphoresis, PND, but sometimes gets orthopneic and develops lower extremity edema.  No polyuria, polydipsia, polyphagia or blurred vision.  ED Course: Initial vital signs temperature 98 F, pulse 93, respiration 20, blood pressure 95/53 mmHg and O2 sat 96% on room air.  The patient had 8.2 L of peritoneal fluid drained via therapeutic/diagnostic paracentesis.  She was given ceftriaxone 2 g IVPB.    Her CBC shows a hemoglobin 11.6 g/dL, RDW 22.7% and platelets of 140.  All other values are normal.  PT 17.0, INR is 1.4.  Sodium 129, potassium 4.2, chloride 98 and CO2 21 mmol/L.  Glucose 308, BUN 72, creatinine 2.49 and calcium 8.1 mg/dL.  Total protein was 5.9 and albumin 2.7 g/dL.  Review of Systems: As per HPI otherwise 10 point review of systems negative.   Past Medical History:  Diagnosis Date  . Acid reflux   . Anemia   . Arthritis   . C. difficile colitis June/July 2015  . Cancer ([place])    skin  cancer  . CHF (congestive heart failure) ([place])   . Cirrhosis ([place])    likely due to NASH. Negative viral markers 2015  . Complication of anesthesia   . COPD (chronic obstructive pulmonary disease) ([place])   . Diabetes mellitus without complication ([place])   . GERD (gastroesophageal reflux disease)   . History of kidney stones   . Hypercholesteremia   . Hypertension   . Hypothyroidism   . IBS (irritable bowel syndrome)   . Kidney disease   . Liver disease   . Neuropathy   . PONV (postoperative nausea and vomiting)   . Sleep apnea   . Thyroid disease     Past Surgical History:  Procedure Laterality Date  . ABDOMINAL HYSTERECTOMY    . APPENDECTOMY    . CAROTID ARTERY - SUBCLAVIAN ARTERY BYPASS GRAFT Right   . CARPAL TUNNEL RELEASE Right   . CARPAL TUNNEL RELEASE Left 12/03/2016   Procedure: CARPAL TUNNEL RELEASE;  Surgeon: Carole Civil, MD;  Location: [facility];  Service: Orthopedics;  Laterality: Left;  . CATARACT EXTRACTION Bilateral   . CHOLECYSTECTOMY    . COLONOSCOPY N/A 02/19/2014   Dr. Gala Romney: rectal varices, rectal polyp overlying a varix non-manipulated  . ESOPHAGEAL BANDING N/A 08/15/2018   Procedure: ESOPHAGEAL BANDING;  Surgeon: Daneil Dolin, MD;  Location: [facility];  Service: Endoscopy;  Laterality: N/A;  . ESOPHAGEAL BANDING N/A 09/15/2018   Procedure: ESOPHAGEAL BANDING;  Surgeon: Daneil Dolin, MD;  Location: [facility];  Service: Endoscopy;  Laterality: N/A;  . ESOPHAGOGASTRODUODENOSCOPY N/A 02/19/2014   Dr. Gala Romney: 4 columns of grade 2  esophageal varices, multiple gastric polyps with largest biopsied and hyperplastic. Negative H.pylori  . ESOPHAGOGASTRODUODENOSCOPY N/A 04/13/2018   Dr. Gala Romney: Grade 3 esophageal varices, no esophagitis, portal gastropathy, multiple hyperplastic appearing polyps  . ESOPHAGOGASTRODUODENOSCOPY (EGD) WITH PROPOFOL N/A 08/15/2018   Procedure: ESOPHAGOGASTRODUODENOSCOPY (EGD) WITH PROPOFOL;  Surgeon: Daneil Dolin, MD;  Location:  [facility];  Service: Endoscopy;  Laterality: N/A;  1:45pm  . ESOPHAGOGASTRODUODENOSCOPY (EGD) WITH PROPOFOL N/A 09/15/2018   Dr. Gala Romney: Grade 2 esophageal varices, portal gastropathy, normal first and second duodenum, s/p banding X 5. Surveillance in 1 year  . GIVENS CAPSULE STUDY N/A 05/04/2014   multiple polypoid-like lesions throughout small bowel, scattered superficial non-bleeding erosions more distal bowel, referred to [facility] for enteroscopy. Capsule study not complete, poor images.   Marland Kitchen TRIGGER FINGER RELEASE Left 12/03/2016   Procedure: RELEASE TRIGGER FINGER/A-1 PULLEY LEFT LONG TRIGGER FINGER RELEASE;  Surgeon: Carole Civil, MD;  Location: [facility];  Service: Orthopedics;  Laterality: Left;  . urosepsis       reports that she quit smoking about 16 years ago. She has a 70.00 pack-year smoking history. She has never used smokeless tobacco. She reports that she does not drink alcohol or use drugs.  Allergies  Allergen Reactions  . Erythromycin Hives  . Niacin Rash and Shortness Of Breath  . Penicillins Shortness Of Breath and Other (See Comments)    Has patient had a PCN reaction causing immediate rash, facial/tongue/throat swelling, SOB or lightheadedness with hypotension: Yes Has patient had a PCN reaction causing severe rash involving mucus membranes or skin necrosis: No Has patient had a PCN reaction that required hospitalization: No Has patient had a PCN reaction occurring within the last 10 years: No If all of the above answers are "NO", then may proceed with Cephalosporin use. [place] with cephaloporins  . Sulfa Antibiotics Hives  . Ciprofloxacin Itching and Nausea And Vomiting    Stomach pain  . Statins Other (See Comments)    Muscle Pain  . Codeine Itching  . Fluticasone Rash  . Tramadol Rash    Family History  Problem Relation Age of Onset  . Diabetes Mother   . Hypertension Mother   . Kidney failure Mother   . Blindness Mother   . Aneurysm Father   . Colon  cancer Neg Hx    Prior to Admission medications   Medication Sig Start Date End Date Taking? Authorizing Provider  amitriptyline (ELAVIL) 25 MG tablet Take 25 mg by mouth at bedtime.    [provider]  aspirin EC 81 MG tablet Take 1 tablet (81 mg total) by mouth daily with breakfast. 06/15/19   Emokpae, Courage, MD  CRANBERRY PO Take 15,000 mg by mouth at bedtime.     [provider]  donepezil (ARICEPT) 5 MG tablet Take 5 mg by mouth at bedtime.  01/12/18   [provider]  ezetimibe (ZETIA) 10 MG tablet Take 10 mg by mouth every morning.     [provider]  ferrous sulfate 325 (65 FE) MG tablet Take 1 tablet (325 mg total) by mouth daily. 07/05/19   Roxan Hockey, MD  gabapentin (NEURONTIN) 100 MG capsule Take 1 capsule (100 mg total) by mouth at bedtime. 07/04/19   Roxan Hockey, MD  insulin aspart (NOVOLOG) 100 UNIT/ML injection insulin aspart (novoLOG) injection 0-9 Units 0-9 Units, Subcutaneous, 3 times daily with meals, CBG < 70: Implement Hypoglycemia Standing Orders and refer to Hypoglycemia Standing Orders sidebar report  CBG 70 -  120: 0 units CBG 121 - 150: 1 unit CBG 151 - 200: 2 units CBG 201 - 250: 3 units CBG 251 - 300: 5 units CBG 301 - 350: 7 units CBG 351 - 400: 9 units CBG > 400: 07/04/19 07/03/20  Emokpae, Courage, MD  insulin glargine (LANTUS) 100 UNIT/ML injection Inject 0.24 mLs (24 Units total) into the skin 2 (two) times daily. 07/04/19   Roxan Hockey, MD  lactulose (CHRONULAC) 10 GM/15ML solution Take 45 mLs (30 g total) by mouth 2 (two) times daily. 07/04/19   Roxan Hockey, MD  lansoprazole (PREVACID) 30 MG capsule Take 1 capsule (30 mg total) by mouth 2 (two) times daily before a meal. Patient taking differently: Take 30 mg by mouth daily.  07/13/18   Mahala Menghini, PA-C  levothyroxine (SYNTHROID) 100 MCG tablet TAKE 1 TABLET BY MOUTH ONCE DAILY. Patient taking differently: Take 100 mcg by mouth daily before breakfast.   06/01/19   Nida, Marella Chimes, MD  Multiple Vitamins-Minerals (CENTRUM SILVER ADULT 50+ PO) Take 1 tablet by mouth daily.    [provider]  ondansetron (ZOFRAN) 4 MG tablet Take 1 tablet (4 mg total) by mouth 4 (four) times daily -  before meals and at bedtime. 06/15/19   Roxan Hockey, MD  oxyCODONE (OXY IR/ROXICODONE) 5 MG immediate release tablet Take 1 tablet (5 mg total) by mouth every 4 (four) hours as needed for moderate pain or severe pain. 07/04/19   Roxan Hockey, MD  VICTOZA 18 MG/3ML SOPN INJECT 1.8MG SUBCUTANEOUSLY ONCE DAILY. Patient taking differently: Inject 1.8 mg into the skin daily.  04/10/19   Cassandria Anger, MD  Vitamin D, Ergocalciferol, (DRISDOL) 50000 units CAPS capsule Take 50,000 Units by mouth every 30 (thirty) days.  01/20/18   [provider]    Physical Exam: Vitals:   07/12/19 0300 07/12/19 0330 07/12/19 0400 07/12/19 0413  BP: 111/88 134/71 (!) 108/58   Pulse: 94 87 85 87  Resp: 18 17 14 19   Temp:      TempSrc:      SpO2: 98% 97% 97% 97%  Weight:      Height:       Constitutional: NAD, calm, comfortable Eyes: PERRL, lids and conjunctivae are mildly icteric. ENMT: Mucous membranes are moist. Posterior pharynx clear of any exudate or lesions. Neck: normal, supple, no masses, no thyromegaly Respiratory: Decreased breath sounds in bases, otherwise clear to auscultation bilaterally, no wheezing, no crackles. Normal respiratory effort. No accessory muscle use.  Cardiovascular: Regular rate and rhythm, no murmurs / rubs / gallops. No extremity edema. 2+ pedal pulses. No carotid bruits.  Abdomen: Obese, distended, positive ascites.Soft, diffuse tenderness, no masses palpated. No hepatosplenomegaly. Bowel sounds positive.  Musculoskeletal: no clubbing / cyanosis. Good ROM, no contractures. Normal muscle tone.  Skin: Multiple erythematosus patches, particularly lower extremities. Neurologic: CN 2-12 grossly intact. Sensation intact,  DTR normal. Strength 5/5 in all 4.  Psychiatric: Alert and oriented x 3. Normal mood.   Labs on Admission: I have personally reviewed following labs and imaging studies  CBC: Recent Labs  Lab 07/12/19 0147  WBC 8.5  NEUTROABS 6.7  HGB 11.6*  HCT 36.8  MCV 84.6  PLT 323*   Basic Metabolic Panel: Recent Labs  Lab 07/12/19 0147  NA 129*  K 4.2  CL 98  CO2 21*  GLUCOSE 308*  BUN 72*  CREATININE 2.49*  CALCIUM 8.1*   GFR: Estimated Creatinine Clearance: 23.7 mL/min (A) (by C-G formula based on  SCr of 2.49 mg/dL (H)). Liver Function Tests: Recent Labs  Lab 07/12/19 0147  AST 29  ALT 19  ALKPHOS 114  BILITOT 0.6  PROT 5.9*  ALBUMIN 2.7*   No results for input(s): LIPASE, AMYLASE in the last 168 hours. Recent Labs  Lab 07/12/19 0147  AMMONIA 116*   Coagulation Profile: Recent Labs  Lab 07/12/19 0147  INR 1.4*   Cardiac Enzymes: No results for input(s): CKTOTAL, CKMB, CKMBINDEX, TROPONINI in the last 168 hours. BNP (last 3 results) No results for input(s): PROBNP in the last 8760 hours. HbA1C: No results for input(s): HGBA1C in the last 72 hours. CBG: No results for input(s): GLUCAP in the last 168 hours. Lipid Profile: No results for input(s): CHOL, HDL, LDLCALC, TRIG, CHOLHDL, LDLDIRECT in the last 72 hours. Thyroid Function Tests: No results for input(s): TSH, T4TOTAL, FREET4, T3FREE, THYROIDAB in the last 72 hours. Anemia Panel: No results for input(s): VITAMINB12, FOLATE, FERRITIN, TIBC, IRON, RETICCTPCT in the last 72 hours. Urine analysis:    Component Value Date/Time   COLORURINE YELLOW 07/03/2019 1520   APPEARANCEUR HAZY (A) 07/03/2019 1520   LABSPEC 1.012 07/03/2019 1520   PHURINE 5.0 07/03/2019 1520   GLUCOSEU NEGATIVE 07/03/2019 1520   HGBUR SMALL (A) 07/03/2019 1520   BILIRUBINUR NEGATIVE 07/03/2019 [place] 07/03/2019 1520   PROTEINUR NEGATIVE 07/03/2019 1520   UROBILINOGEN 0.2 03/05/2014 2140   NITRITE NEGATIVE  07/03/2019 1520   LEUKOCYTESUR SMALL (A) 07/03/2019 1520    Radiological Exams on Admission: Dg Chest Port 1 View  Result Date: 07/12/2019 CLINICAL DATA:  Shortness of breath EXAM: PORTABLE CHEST 1 VIEW COMPARISON:  06/13/2019 FINDINGS: Cardiomegaly. Left pleural effusion with left lower lobe atelectasis or infiltrate. No confluent opacity on the right. No edema. No acute bony abnormality. IMPRESSION: Left pleural effusion with left lower lobe atelectasis or infiltrate/pneumonia. Electronically Signed   By: Rolm Baptise M.D.   On: 07/12/2019 01:53    EKG: Independently reviewed.  Vent. rate 94 BPM PR interval * ms QRS duration 145 ms QT/QTc 388/486 ms P-R-T axes 54 240 22 Sinus rhythm Right bundle branch block Anterolateral infarct, old  Assessment/Plan Principal Problem:    Abdominal pain (likely)   Spontaneous bacterial peritonitis ([place]) Pending peritoneal fluid chemistry and culture results. Analgesics as needed. Continue ceftriaxone 2 g IVPB every 24 hours. Antiemetics as needed.  Active Problems:   Hepatic cirrhosis (HCC) MELD-Na score of 25 for 14 to 15% 90-day mortality. Monitor LFTs. Continue lactulose.   Left lower lobe pulmonary infiltrate It was present for weeks ago. Currently on ceftriaxone for potential SBP. Consider CT chest without contrast.    CKD stage 5 due to type 2 diabetes mellitus ([place]) Around baseline. Monitor renal function electrolytes.    Other specified hypothyroidism Continue Synthroid 100 mcg p.o. daily.    Uncontrolled type 2 diabetes mellitus with complication (HCC) Carbohydrate modified diet. Continue Lantus 24 units SQ twice daily. CBG monitoring regular insulin sliding scale.    Mixed hyperlipidemia Continue Zetia p.o. 10 mg p.o. daily.    Hyponatremia Secondary to cirrhosis. Follow-up sodium level.    GERD (gastroesophageal reflux disease) Continue PPI.    Hyperammonemia (HCC) Continue lactulose. Follow-up ammonia  level.    DVT prophylaxis: SCDs. Code Status: Full code. Family Communication: Disposition Plan: Admit for IV antibiotic therapy for 2 to 3 days. Consults called: Admission status: Inpatient/telemetry.   Reubin Milan MD [facility]  If 7PM-7AM, please contact night-coverage [email or web address]  07/12/2019, 4:47  AM   This document was prepared using Dragon voice recognition software and may contain some unintended transcription errors.

## 2019-07-12 NOTE — Progress Notes (Signed)
Per HPI: Penny Martinez is a 68 y.o. female with medical history significant for  gastroesophageal reflux disease, chronic diastolic heart failure,NASH/cirrhosis, morbid obesity, dyslipidemia, hypertension, type 2 diabetes mellitus with nephropathy, chronic kidney disease stage III, hypothyroidism and a sleep apnea; who presented to the emergency department with complaints of abdominal pain due to abdominal distention secondary to decompensated liver cirrhosis.  She denies nausea, emesis, constipation, melena or hematochezia.  No dysuria, frequency or hematuria.  She denies fever, chills, rhinorrhea, sore throat, productive cough, wheezing or hemoptysis.  She occasionally gets dyspneic.  She denies chest pain, palpitations, dizziness, diaphoresis, PND, but sometimes gets orthopneic and develops lower extremity edema.  No polyuria, polydipsia, polyphagia or blurred vision.  11/4: Patient was admitted for SBP and has undergone paracentesis of 8.1 L of fluid.  She is feeling overall much better and does not appear to have any signs or symptoms of pneumonia with noted left lower lobe pulmonary infiltrate.  She remains on Rocephin 2 g IV every 24 hours with fluid culture and further results pending.  Consult to GI for further assistance and evaluation management.  Patient states that she has not been taking diuretics recently.  It appears, that these diuretics to include torsemide and Aldactone were discontinued on recent discharge at 10/27 due to AKI with some concern for hepatorenal syndrome.  She is supposed to follow-up with her nephrologist Dr. Juleen China of [place] soon.  Total care time: 20 minutes.

## 2019-07-12 NOTE — Plan of Care (Signed)

## 2019-07-12 NOTE — ED Notes (Signed)
8.2 Liters of body fluid removed through paracentesis

## 2019-07-12 NOTE — ED Triage Notes (Signed)
Pt brought in by [facility] for c/o low O2 levels; pt has fluid on her abdomen and is supposed to have fluid drawn off of her later today; pt denies any pain or any increased sob

## 2019-07-12 NOTE — ED Provider Notes (Signed)
[facility] EMERGENCY DEPARTMENT Provider Note   CSN: [id number] Arrival date & time: 07/12/19  0040     History   Chief Complaint Chief Complaint  Patient presents with   Shortness of Breath    HPI RANDE Martinez is a 68 y.o. female.     Patient sent to the emergency department from skilled nursing facility for evaluation of decreased oxygen saturation.  Patient reports that she has had progressive distention of her abdomen which is causing her to feel short of breath.  Patient has a history of cirrhosis and reports that abdominal distention is consistent with increased ascites.  She is not experiencing any abdominal pain and has not had a fever.     Past Medical History:  Diagnosis Date   Acid reflux    Anemia    Arthritis    C. 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 Negative viral markers 7412   Complication of anesthesia    COPD (chronic obstructive pulmonary disease) (HCC)    Diabetes mellitus without complication (HCC)    GERD (gastroesophageal reflux disease)    History of kidney stones    Hypercholesteremia    Hypertension    Hypothyroidism    IBS (irritable bowel syndrome)    Kidney disease    Liver disease    Neuropathy    PONV (postoperative nausea and vomiting)    Sleep apnea    Thyroid disease     Patient Active Problem List   Diagnosis Date Noted   Spontaneous bacterial peritonitis ([place]) 07/12/2019   Hyperammonemia ([place]) 06/27/2019   Acute on chronic renal failure (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/24/2018   Hyponatremia syndrome 05/24/2018   Other dysphagia 09/15/2017   Uncontrolled type 2 diabetes mellitus with complication ([place])  87/86/7672   Mixed hyperlipidemia 04/27/2017   Hyponatremia 04/27/2017   Chronic diarrhea 12/15/2016   Carpal tunnel syndrome, left    Trigger middle finger of left hand    Thrombocytopenia ([place]) 11/04/2016   Iron deficiency anemia due to chronic blood loss 01/31/2016   Other specified hypothyroidism 09/20/2015   Essential hypertension, benign 07/12/2015   Morbid obesity with BMI of 40.0-44.9, adult ([place]) 07/12/2015   Hepatic cirrhosis ([place]) 04/02/2014   Acute colitis 03/05/2014   Abdominal pain, acute, left lower quadrant 03/05/2014   Colitis 03/05/2014   Splenomegaly, congestive, chronic 02/17/2014   Sepsis ([place]) 02/15/2014   Absolute anemia 02/15/2014   Diabetes mellitus with stage 3 chronic kidney disease ([place]) 02/15/2014   CHF (congestive heart failure) ([place]) 02/15/2014   CKD (chronic kidney disease) 02/15/2014   UTI (lower urinary tract infection) 02/15/2014    Past Surgical History:  Procedure Laterality Date   ABDOMINAL HYSTERECTOMY     APPENDECTOMY     CAROTID ARTERY - SUBCLAVIAN ARTERY BYPASS GRAFT Right    CARPAL TUNNEL RELEASE Right    CARPAL TUNNEL RELEASE Left 12/03/2016   Procedure: CARPAL TUNNEL RELEASE;  Surgeon: Carole Civil, MD;  Location: [facility] ORS;  Service: Orthopedics;  Laterality: Left;   CATARACT EXTRACTION Bilateral    CHOLECYSTECTOMY     COLONOSCOPY N/A 02/19/2014   Dr. Gala Romney: rectal varices, rectal polyp overlying a varix non-manipulated  ESOPHAGEAL BANDING N/A 08/15/2018   Procedure: ESOPHAGEAL BANDING;  Surgeon: Daneil Dolin, MD;  Location: [facility] ENDO SUITE;  Service: Endoscopy;  Laterality: N/A;   ESOPHAGEAL BANDING N/A 09/15/2018   Procedure: ESOPHAGEAL BANDING;  Surgeon: Daneil Dolin, MD;  Location: [facility] END[facility] SU[facility];  Service: Endoscopy;  Laterality: N/A;   ESOPHAGOGASTRODUODENOSCOPY N/A 02/19/2014   Dr. Gala Romney: 4 columns of grade 2 esophageal varices, multiple gastric polyps with largest biopsied and hyperplastic.  Negative H.pylori   ESOPHAGOGASTRODUODENOSCOPY N/A 04/13/2018   Dr. Gala Romney: Grade 3 esophageal varices, no esophagitis, portal gastropathy, multiple hyperplastic appearing polyps   ESOPHAGOGASTRODUODENOSCOPY (EGD) WITH PROPOFOL N/A 08/15/2018   Procedure: ESOPHAGOGASTRODUODENOSCOPY (EGD) WITH PROPOFOL;  Surgeon: Daneil Dolin, MD;  Location: [facility];  Service: Endoscopy;  Laterality: N/A;  1:45pm   ESOPHAGOGASTRODUODENOSCOPY (EGD) WITH PROPOFOL N/A 09/15/2018   Dr. Gala Romney: Grade 2 esophageal varices, portal gastropathy, normal first and second duodenum, s/p banding X 5. Surveillance in 1 year   GIVENS CAPSULE STUDY N/A 05/04/2014   multiple polypoid-like lesions throughout small bowel, scattered superficial non-bleeding erosions more distal bowel, referred [facility] for enteroscopy. Capsule study not complete, poor images.    TRIGGER FINGER RELEASE Left 12/03/2016   Procedure: RELEASE TRIGGER FINGER/A-1 PULLEY LEFT LONG TRIGGER FINGER RELEASE;  Surgeon: Carole Civil, MD;  Location: [facility] OR[facility];  Service: Orthopedics;  Laterality: Left;   urosepsis       OB History    Gravida      Para      Term      Preterm      AB      Living  3     SAB      TAB      Ectopic      Multiple      Live Births               Home Medications    Prior to Admission medications   Medication Sig Start Date End Date Taking? Authorizing Provider  amitriptyline (ELAVIL) 25 MG tablet Take 25 mg by mouth at bedtime.    [provider]  aspirin EC 81 MG tablet Take 1 tablet (81 mg total) by mouth daily with breakfast. 06/15/19   Emokpae, Courage, MD  CRANBERRY PO Take 15,000 mg by mouth at bedtime.     [provider]  donepezil (ARICEPT) 5 MG tablet Take 5 mg by mouth at bedtime.  01/12/18   [provider]  ezetimibe (ZETIA) 10 MG tablet Take 10 mg by mouth every morning.     [provider]  ferrous sulfate 325 (65 FE) MG tablet Take 1 tablet (325 mg  total) by mouth daily. 07/05/19   Roxan Hockey, MD  gabapentin (NEURONTIN) 100 MG capsule Take 1 capsule (100 mg total) by mouth at bedtime. 07/04/19   Roxan Hockey, MD  insulin aspart (NOVOLOG) 100 UNIT/ML injection insulin aspart (novoLOG) injection 0-9 Units 0-9 Units, Subcutaneous, 3 times daily with meals, CBG < 70: Implement Hypoglycemia Standing Orders and refer to Hypoglycemia Standing Orders sidebar report  CBG 70 - 120: 0 units CBG 121 - 150: 1 unit CBG 151 - 200: 2 units CBG 201 - 250: 3 units CBG 251 - 300: 5 units CBG 301 - 350: 7 units CBG 351 - 400: 9 units CBG > 400: 07/04/19 07/03/20  Emokpae, Courage, MD  insulin glargine (LANTUS) 100 UNIT/ML injection Inject 0.24 mLs (24 Units total) into the skin 2 (two) times daily.  07/04/19   Roxan Hockey, MD  lactulose (CHRONULAC) 10 GM/15ML solution Take 45 mLs (30 g total) by mouth 2 (two) times daily. 07/04/19   Roxan Hockey, MD  lansoprazole (PREVACID) 30 MG capsule Take 1 capsule (30 mg total) by mouth 2 (two) times daily before a meal. Patient taking differently: Take 30 mg by mouth daily.  07/13/18   Mahala Menghini, PA-C  levothyroxine (SYNTHROID) 100 MCG tablet TAKE 1 TABLET BY MOUTH ONCE DAILY. Patient taking differently: Take 100 mcg by mouth daily before breakfast.  06/01/19   Nida, Marella Chimes, MD  Multiple Vitamins-Minerals (CENTRUM SILVER ADULT 50+ PO) Take 1 tablet by mouth daily.    [provider]  ondansetron (ZOFRAN) 4 MG tablet Take 1 tablet (4 mg total) by mouth 4 (four) times daily -  before meals and at bedtime. 06/15/19   Roxan Hockey, MD  oxyCODONE (OXY IR/ROXICODONE) 5 MG immediate release tablet Take 1 tablet (5 mg total) by mouth every 4 (four) hours as needed for moderate pain or severe pain. 07/04/19   Roxan Hockey, MD  VICTOZA 18 MG/3ML SOPN INJECT 1.8MG SUBCUTANEOUSLY ONCE DAILY. Patient taking differently: Inject 1.8 mg into the skin daily.  04/10/19   Cassandria Anger, MD    Vitamin D, Ergocalciferol, (DRISDOL) 50000 units CAPS capsule Take 50,000 Units by mouth every 30 (thirty) days.  01/20/18   [provider]    Family History Family History  Problem Relation Age of Onset   Diabetes Mother    Hypertension Mother    Kidney failure Mother    Blindness Mother    Aneurysm Father    Colon cancer Neg Hx     Social History Social History   Tobacco Use   Smoking status: Former Smoker    Packs/day: 2.00    Years: 35.00    Pack years: 70.00    Quit date: 05/05/2003    Years since quitting: 16.1   Smokeless tobacco: Never Used   Tobacco comment: Quit smoking 11 years ago  Substance Use Topics   Alcohol use: No   Drug use: No     Allergies   Erythromycin, Niacin, Penicillins, Sulfa antibiotics, Ciprofloxacin, Statins, Codeine, Fluticasone, and Tramadol   Review of Systems Review of Systems  Respiratory: Positive for shortness of breath.   Gastrointestinal: Positive for abdominal distention.  All other systems reviewed and are negative.    Physical Exam Updated Vital Signs BP (!) 108/58    Pulse 85    Temp 98 F (36.7 C) (Oral)    Resp 14    Ht 5' 1"  (1.549 m)    Wt 101.6 kg    SpO2 97%    BMI 42.32 kg/m   Physical Exam Vitals signs and nursing note reviewed.  Constitutional:      General: She is not in acute distress.    Appearance: Normal appearance. She is well-developed.  HENT:     Head: Normocephalic and atraumatic.     Right Ear: Hearing normal.     Left Ear: Hearing normal.     Nose: Nose normal.  Eyes:     Conjunctiva/sclera: Conjunctivae normal.     Pupils: Pupils are equal, round, and reactive to light.  Neck:     Musculoskeletal: Normal range of motion and neck supple.  Cardiovascular:     Rate and Rhythm: Regular rhythm.     Heart sounds: S1 normal and S2 normal. No murmur. No friction rub. No gallop.   Pulmonary:  Effort: Pulmonary effort is normal. No respiratory distress.     Breath  sounds: Normal breath sounds.  Chest:     Chest wall: No tenderness.  Abdominal:     General: Bowel sounds are normal. There is distension.     Tenderness: There is no abdominal tenderness. There is no guarding or rebound. Negative signs include Murphy's sign and McBurney's sign.     Hernia: No hernia is present.  Musculoskeletal: Normal range of motion.  Skin:    General: Skin is warm and dry.     Findings: No rash.  Neurological:     Mental Status: She is alert and oriented to person, place, and time.     GCS: GCS eye subscore is 4. GCS verbal subscore is 5. GCS motor subscore is 6.     Cranial Nerves: No cranial nerve deficit.     Sensory: No sensory deficit.     Coordination: Coordination normal.  Psychiatric:        Speech: Speech normal.        Behavior: Behavior normal.        Thought Content: Thought content normal.      ED Treatments / Results  Labs (all labs ordered are listed, but only abnormal results are displayed) Labs Reviewed  CBC WITH DIFFERENTIAL/PLATELET - Abnormal; Notable for the following components:      Result Value   Hemoglobin 11.6 (*)    RDW 22.7 (*)    Platelets 140 (*)    All other components within normal limits  COMPREHENSIVE METABOLIC PANEL - Abnormal; Notable for the following components:   Sodium 129 (*)    CO2 21 (*)    Glucose, Bld 308 (*)    BUN 72 (*)    Creatinine, Ser 2.49 (*)    Calcium 8.1 (*)    Total Protein 5.9 (*)    Albumin 2.7 (*)    GFR calc non Af Amer 19 (*)    GFR calc Af Amer 22 (*)    All other components within normal limits  AMMONIA - Abnormal; Notable for the following components:   Ammonia 116 (*)    All other components within normal limits  PROTIME-INR - Abnormal; Notable for the following components:   Prothrombin Time 17.0 (*)    INR 1.4 (*)    All other components within normal limits  BODY FLUID CULTURE  BRAIN NATRIURETIC PEPTIDE  LACTATE DEHYDROGENASE, PLEURAL OR PERITONEAL FLUID  GLUCOSE,  PLEURAL OR PERITONEAL FLUID  PROTEIN, PLEURAL OR PERITONEAL FLUID  ALBUMIN, PLEURAL OR PERITONEAL FLUID    EKG EKG Interpretation  Date/Time:  Wednesday July 12 2019 00:58:38 EST Ventricular Rate:  94 PR Interval:    QRS Duration: 145 QT Interval:  388 QTC Calculation: 486 R Axis:   -120 Text Interpretation: Sinus rhythm Right bundle branch block Anterolateral infarct, old No significant change since last tracing Confirmed by Orpah Greek [phone]) on 07/12/2019 1:20:35 AM   Radiology Dg Chest Port 1 View  Result Date: 07/12/2019 CLINICAL DATA:  Shortness of breath EXAM: PORTABLE CHEST 1 VIEW COMPARISON:  06/13/2019 FINDINGS: Cardiomegaly. Left pleural effusion with left lower lobe atelectasis or infiltrate. No confluent opacity on the right. No edema. No acute bony abnormality. IMPRESSION: Left pleural effusion with left lower lobe atelectasis or infiltrate/pneumonia. Electronically Signed   By: Rolm Baptise M.D.   On: 07/12/2019 01:53    Procedures .Paracentesis  Date/Time: 07/12/2019 4:11 AM Performed by: Orpah Greek, MD Authorized by: Betsey Holiday  Gwenyth Allegra, MD   Consent:    Consent obtained:  Written   Consent given by:  Patient   Risks discussed:  Bleeding, bowel perforation, infection and pain Universal protocol:    Procedure explained and questions answered to patient or proxy's satisfaction: yes     Relevant documents present and verified: yes     Test results available and properly labeled: yes     Required blood products, implants, devices and special equipment available: yes     Site/side marked: yes     Immediately prior to procedure a time out was called: yes     Patient identity confirmed:  Verbally with patient Pre-procedure details:    Procedure purpose:  Therapeutic   Preparation: Patient was prepped and draped in usual sterile fashion   Anesthesia (see MAR for exact dosages):    Anesthesia method:  Local infiltration   Local  anesthetic:  Lidocaine 1% w/o epi Procedure details:    Needle gauge:  18   Ultrasound guidance: yes     Puncture site:  L lower quadrant   Fluid removed amount:  8.1L   Fluid appearance:  Purulent   Dressing:  Adhesive bandage Post-procedure details:    Patient tolerance of procedure:  Tolerated well, no immediate complications Comments:     The best site for paracentesis was chosen with ultrasound guidance.  Depth was first tested with lidocaine needle, brown ascites fluid was aspirated at exactly the length of the 25-gauge needle used to anesthetize the skin.  The paracentesis needle was advanced very slowly with constant aspiration and stopped immediately when ascitic fluid was obtained, catheter was then advanced over the needle.   (including critical care time)  Medications Ordered in ED Medications  albumin human 25 % solution 0-100 g (has no administration in time range)  cefTRIAXone (ROCEPHIN) 2 g in sodium chloride 0.9 % 100 mL IVPB (2 g Intravenous New Bag/Given 07/12/19 0422)     Initial Impression / Assessment and Plan / ED Course  I have reviewed the triage vital signs and the nursing notes.  Pertinent labs & imaging results that were available during my care of the patient were reviewed by me and considered in my medical decision making (see chart for details).        Patient presents to the emergency department for evaluation of shortness of breath.  Patient was noted to have low oxygen saturation prior to arrival.  Patient currently resides in [facility] home.  At arrival to the ER she reports that her sats do sometimes drop like this.  She feels like she is very full of ascites and that is likely why she was feeling somewhat short of breath.  Lab work reveals hyperammonemia but she is alert and oriented x3.  It was noted that she is scheduled for paracentesis later today in radiology, however I did decide to perform the procedure myself in an attempt to  possibly discharge her back to the nursing home.  When I performed the procedure, however, ascitic fluid was grossly purulent.  I was able to remove 8.1 L of fluid.  The last bottle looked the same as the first bottle, no clearing of the fluid.  Fluid sent for studies, but patient started empirically on antibiotic coverage for SBP.  Based on this, patient will be hospitalized for further management.  CRITICAL CARE Performed by: Orpah Greek   Total critical care time: 30 minutes  Critical care time was exclusive of separately  billable procedures and treating other patients.  Critical care was necessary to treat or prevent imminent or life-threatening deterioration.  Critical care was time spent personally by me on the following activities: development of treatment plan with patient and/or surrogate as well as nursing, discussions with consultants, evaluation of patient's response to treatment, examination of patient, obtaining history from patient or surrogate, ordering and performing treatments and interventions, ordering and review of laboratory studies, ordering and review of radiographic studies, pulse oximetry and re-evaluation of patient's condition.   Final Clinical Impressions(s) / ED Diagnoses   Final diagnoses:  SBP (spontaneous bacterial peritonitis) [facility])    ED Discharge Orders    None       [place] Wachter, Gwenyth Allegra, MD 07/12/19 0425

## 2019-07-12 NOTE — Consult Note (Signed)
Referring Provider: Heath Lark, DO Primary Care Physician:  Vidal Schwalbe, MD Primary Gastroenterologist:  Dr. Gala Romney.  Reason for Consultation:   Spontaneous bacterial peritonitis.  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  Her disease was complicated by esophageal varices which have been banded multiple times by Dr. Gala Romney.  He has never experienced esophageal variceal bleed.  Last EGD with banding was in January this year.  She also has developed hepatic encephalopathy and has required periodic LVAP for ascites. Patient was hospitalized from 06/27/2019 through 07/04/2019 for acute on chronic kidney disease hyperkalemia and hepatic encephalopathy.  She did undergo abdominal paracenteses on 07/03/2019 with removal of 7.1 L of fluid.  Patient tells me the fluid was clear amber-colored.  She was discharged to nursing home for rehab. Last night she was noted to have low oxygen saturation.  Staff also noted that her ascites has become tense.  She was brought to emergency room just after midnight.  He underwent large-volume abdominal paracenteses by Dr. Joseph Berkshire of ER with removal of 8 L of fluid.  He noted fluid to be purulent looking.  Fluid was sent for Gram stain and cultures and patient was begun on ceftriaxone 2 g IV every 24 hours.  She remains on lactulose for hepatic encephalopathy. Patient feels much better.  She did experience chills yesterday but she has not had fever.  She also denies nausea vomiting melena or rectal bleeding.  Patient just had a bowel movement before I walked into her room and according to nursing staff it was normal. She states she is able to breathe better since ascitic fluid was removed.  She denies cough or chest pain.    Past Medical History:  Diagnosis Date  . Acid reflux   . Anemia   . Arthritis   . C. difficile colitis June/July 2015  . Cancer ([place])    skin cancer  . CHF  (congestive heart failure) ([place])   . Cirrhosis ([place])    likely due to NASH. Negative viral markers 2015  . Complication of anesthesia   . COPD (chronic obstructive pulmonary disease) ([place])   . Diabetes mellitus without complication ([place])   . GERD (gastroesophageal reflux disease)   . History of kidney stones   . Hypercholesteremia   . Hypertension   . Hypothyroidism   . IBS (irritable bowel syndrome)   . Kidney disease   . Liver disease   . Neuropathy   . PONV (postoperative nausea and vomiting)   . Sleep apnea   . Thyroid disease     Past Surgical History:  Procedure Laterality Date  . ABDOMINAL HYSTERECTOMY    . APPENDECTOMY    . CAROTID ARTERY - SUBCLAVIAN ARTERY BYPASS GRAFT Right   . CARPAL TUNNEL RELEASE Right   . CARPAL TUNNEL RELEASE Left 12/03/2016   Procedure: CARPAL TUNNEL RELEASE;  Surgeon: Carole Civil, MD;  Location: [facility];  Service: Orthopedics;  Laterality: Left;  . CATARACT EXTRACTION Bilateral   . CHOLECYSTECTOMY    . COLONOSCOPY N/A 02/19/2014   Dr. Gala Romney: rectal varices, rectal polyp overlying a varix non-manipulated  . ESOPHAGEAL BANDING N/A 08/15/2018   Procedure: ESOPHAGEAL BANDING;  Surgeon: Daneil Dolin, MD;  Location: [facility];  Service: Endoscopy;  Laterality: N/A;  . ESOPHAGEAL BANDING N/A 09/15/2018   Procedure: ESOPHAGEAL BANDING;  Surgeon: Daneil Dolin, MD;  Location[facility];  Service: Endoscopy;  Laterality: N/A;  .  ESOPHAGOGASTRODUODENOSCOPY N/A 02/19/2014   Dr. Gala Romney: 4 columns of grade 2 esophageal varices, multiple gastric polyps with largest biopsied and hyperplastic. Negative H.pylori  . ESOPHAGOGASTRODUODENOSCOPY N/A 04/13/2018   Dr. Gala Romney: Grade 3 esophageal varices, no esophagitis, portal gastropathy, multiple hyperplastic appearing polyps  . ESOPHAGOGASTRODUODENOSCOPY (EGD) WITH PROPOFOL N/A 08/15/2018   Procedure: ESOPHAGOGASTRODUODENOSCOPY (EGD) WITH PROPOFOL;  Surgeon: Daneil Dolin, MD;  [facility];   Service: Endoscopy;  Laterality: N/A;  1:45pm  . ESOPHAGOGASTRODUODENOSCOPY (EGD) WITH PROPOFOL N/A 09/15/2018   Dr. Gala Romney: Grade 2 esophageal varices, portal gastropathy, normal first and second duodenum, s/p banding X 5. Surveillance in 1 year  . GIVENS CAPSULE STUDY N/A 05/04/2014   multiple polypoid-like lesions throughout small bowel, scattered superficial non-bleeding erosions more distal bowel, referred to [facility] for enteroscopy. Capsule study not complete, poor images.   Marland Kitchen TRIGGER FINGER RELEASE Left 12/03/2016   Procedure: RELEASE TRIGGER FINGER/A-1 PULLEY LEFT LONG TRIGGER FINGER RELEASE;  Surgeon: Carole Civil, MD;  Location: [facility];  Service: Orthopedics;  Laterality: Left;  . urosepsis      Prior to Admission medications   Medication Sig Start Date End Date Taking? Authorizing Provider  amitriptyline (ELAVIL) 25 MG tablet Take 25 mg by mouth at bedtime.   Yes [provider]  aspirin EC 81 MG tablet Take 1 tablet (81 mg total) by mouth daily with breakfast. 06/15/19  Yes Emokpae, Courage, MD  donepezil (ARICEPT) 5 MG tablet Take 5 mg by mouth at bedtime.  01/12/18  Yes [provider]  ezetimibe (ZETIA) 10 MG tablet Take 10 mg by mouth every morning.    Yes [provider]  ferrous sulfate 325 (65 FE) MG tablet Take 1 tablet (325 mg total) by mouth daily. 07/05/19  Yes Emokpae, Courage, MD  gabapentin (NEURONTIN) 100 MG capsule Take 1 capsule (100 mg total) by mouth at bedtime. Patient taking differently: Take 100 mg by mouth daily.  07/04/19  Yes Emokpae, Courage, MD  insulin aspart (NOVOLOG) 100 UNIT/ML injection insulin aspart (novoLOG) injection 0-9 Units 0-9 Units, Subcutaneous, 3 times daily with meals, CBG < 70: Implement Hypoglycemia Standing Orders and refer to Hypoglycemia Standing Orders sidebar report  CBG 70 - 120: 0 units CBG 121 - 150: 1 unit CBG 151 - 200: 2 units CBG 201 - 250: 3 units CBG 251 - 300: 5 units CBG 301 - 350: 7 units CBG 351 -  400: 9 units CBG > 400: 07/04/19 07/03/20 Yes Emokpae, Courage, MD  insulin glargine (LANTUS) 100 UNIT/ML injection Inject 0.24 mLs (24 Units total) into the skin 2 (two) times daily. 07/04/19  Yes Emokpae, Courage, MD  lactulose (CHRONULAC) 10 GM/15ML solution Take 45 mLs (30 g total) by mouth 2 (two) times daily. 07/04/19  Yes Roxan Hockey, MD  lansoprazole (PREVACID) 30 MG capsule Take 1 capsule (30 mg total) by mouth 2 (two) times daily before a meal. 07/13/18  Yes Mahala Menghini, PA-C  levothyroxine (SYNTHROID) 100 MCG tablet TAKE 1 TABLET BY MOUTH ONCE DAILY. Patient taking differently: Take 100 mcg by mouth daily before breakfast.  06/01/19  Yes Nida, Marella Chimes, MD  ondansetron (ZOFRAN) 4 MG tablet Take 1 tablet (4 mg total) by mouth 4 (four) times daily -  before meals and at bedtime. 06/15/19  Yes Emokpae, Courage, MD  oxyCODONE (OXY IR/ROXICODONE) 5 MG immediate release tablet Take 1 tablet (5 mg total) by mouth every 4 (four) hours as needed for moderate pain or severe pain. 07/04/19  Yes Emokpae, Courage, MD  VICTOZA 18 MG/3ML SOPN INJECT 1.8MG SUBCUTANEOUSLY ONCE DAILY. Patient taking differently: Inject 1.8 mg into the skin daily.  04/10/19  Yes Cassandria Anger, MD    Current Facility-Administered Medications  Medication Dose Route Frequency Provider Last Rate Last Dose  . amitriptyline (ELAVIL) tablet 25 mg  25 mg Oral QHS Reubin Milan, MD      . aspirin EC tablet 81 mg  81 mg Oral Q breakfast Reubin Milan, MD   81 mg at 07/12/19 0936  . cefTRIAXone (ROCEPHIN) 2 g in sodium chloride 0.9 % 100 mL IVPB  2 g Intravenous Q24H Reubin Milan, MD   Stopped at 07/12/19 [phone]  . donepezil (ARICEPT) tablet 5 mg  5 mg Oral QHS Reubin Milan, MD      . ezetimibe (ZETIA) tablet 10 mg  10 mg Oral q morning - 10a Reubin Milan, MD   10 mg at 07/12/19 0935  . ferrous sulfate tablet 325 mg  325 mg Oral Daily Reubin Milan, MD   325 mg at 07/12/19 0935   . gabapentin (NEURONTIN) capsule 100 mg  100 mg Oral QHS Reubin Milan, MD      . insulin aspart (novoLOG) injection 0-9 Units  0-9 Units Subcutaneous TID WC Reubin Milan, MD   5 Units at 07/12/19 1245  . insulin glargine (LANTUS) injection 24 Units  24 Units Subcutaneous BID Reubin Milan, MD   24 Units at 07/12/19 1244  . lactulose (CHRONULAC) 10 GM/15ML solution 30 g  30 g Oral BID Reubin Milan, MD   30 g at 07/12/19 7371  . levothyroxine (SYNTHROID) tablet 100 mcg  100 mcg Oral QAC breakfast Reubin Milan, MD   100 mcg at 07/12/19 0935  . ondansetron (ZOFRAN) tablet 4 mg  4 mg Oral Q6H PRN Reubin Milan, MD       Or  . ondansetron [facility]) injection 4 mg  4 mg Intravenous Q6H PRN Reubin Milan, MD      . oxyCODONE (Oxy IR/ROXICODONE) immediate release tablet 5 mg  5 mg Oral Q4H PRN Reubin Milan, MD      . pantoprazole ([place]) EC tablet 40 mg  40 mg Oral Daily Reubin Milan, MD   40 mg at 07/12/19 0935    Allergies as of 07/12/2019 - Review Complete 07/12/2019  Allergen Reaction Noted  . Erythromycin Hives 02/15/2014  . Niacin Rash and Shortness Of Breath 03/05/2014  . Penicillins Shortness Of Breath and Other (See Comments) 02/15/2014  . Sulfa antibiotics Hives 02/15/2014  . Ciprofloxacin Itching and Nausea And Vomiting 05/23/2018  . Statins Other (See Comments) 03/05/2014  . Codeine Itching 11/26/2016  . Fluticasone Rash 07/25/2014  . Tramadol Rash 07/25/2014    Family History  Problem Relation Age of Onset  . Diabetes Mother   . Hypertension Mother   . Kidney failure Mother   . Blindness Mother   . Aneurysm Father   . Colon cancer Neg Hx     Social History   Socioeconomic History  . Marital status: Married    Spouse name: Not on file  . Number of children: Not on file  . Years of education: Not on file  . Highest education level: Not on file  Occupational History  . Not on file  Social Needs  . Financial  resource strain: Not on file  . Food insecurity    Worry: Not on file  Inability: Not on file  . Transportation needs    Medical: Not on file    Non-medical: Not on file  Tobacco Use  . Smoking status: Former Smoker    Packs/day: 2.00    Years: 35.00    Pack years: 70.00    Quit date: 05/05/2003    Years since quitting: 16.1  . Smokeless tobacco: Never Used  . Tobacco comment: Quit smoking 11 years ago  Substance and Sexual Activity  . Alcohol use: No  . Drug use: No  . Sexual activity: Not Currently    Birth control/protection: Surgical  Lifestyle  . Physical activity    Days per week: Not on file    Minutes per session: Not on file  . Stress: Not on file  Relationships  . Social Herbalist on phone: Not on file    Gets together: Not on file    Attends religious service: Not on file    Active member of club or organization: Not on file    Attends meetings of clubs or organizations: Not on file    Relationship status: Not on file  . Intimate partner violence    Fear of current or ex partner: Not on file    Emotionally abused: Not on file    Physically abused: Not on file    Forced sexual activity: Not on file  Other Topics Concern  . Not on file  Social History Narrative  . Not on file    Review of Systems: See HPI, otherwise normal ROS  Physical Exam: Temp:  [97.9 F (36.6 C)-98 F (36.7 C)] 98 F (36.7 C) (11/04 1327) Pulse Rate:  [84-97] 97 (11/04 1327) Resp:  [11-20] 18 (11/04 1327) BP: (88-134)/(31-88) 88/78 (11/04 1327) SpO2:  [94 %-100 %] 100 % (11/04 1327) Weight:  [97.1 kg-101.6 kg] 97.1 kg (11/04 0552)   Patient is alert and in no acute distress. She has asterixis. Conjunctivae is pink.  Sclerae nonicteric. Oropharyngeal mucosa is normal.  Dentition in satisfactory condition. She has a right carotid endarterectomy scar.  No adenopathy thyromegaly or masses noted. Cardiac exam with regular rhythm normal S1 and S2.  No murmur gallop  noted. Auscultation lungs reveal vesicular breath sounds bilaterally. Abdomen is protuberant.  Bowel sounds are normal.  On palpation she has mild generalized tenderness no guarding.  Flanks are dull.  No organomegaly or masses. She has 2+ pitting edema involving both legs.   Lab Results: Recent Labs    07/12/19 0147  WBC 8.5  HGB 11.6*  HCT 36.8  PLT 140*   BMET Recent Labs    07/12/19 0147  NA 129*  K 4.2  CL 98  CO2 21*  GLUCOSE 308*  BUN 72*  CREATININE 2.49*  CALCIUM 8.1*   LFT Recent Labs    07/12/19 0147  PROT 5.9*  ALBUMIN 2.7*  AST 29  ALT 19  ALKPHOS 114  BILITOT 0.6   PT/INR Recent Labs    07/12/19 0147  LABPROT 17.0*  INR 1.4*   Hepatitis Panel No results for input(s): HEPBSAG, HCVAB, HEPAIGM, HEPBIGM in the last 72 hours.  Studies/Results: Dg Chest Port 1 View  Result Date: 07/12/2019 CLINICAL DATA:  Shortness of breath EXAM: PORTABLE CHEST 1 VIEW COMPARISON:  06/13/2019 FINDINGS: Cardiomegaly. Left pleural effusion with left lower lobe atelectasis or infiltrate. No confluent opacity on the right. No edema. No acute bony abnormality. IMPRESSION: Left pleural effusion with left lower lobe atelectasis or infiltrate/pneumonia. Electronically Signed  By: Rolm Baptise M.D.   On: 07/12/2019 01:53    Assessment;  Patient is 68 year old Caucasian female who has cirrhosis secondary to NASH complicated by esophageal varices status post multiple banding sessions for primary prophylaxis(last EGD January 2020), hepatic encephalopathy and ascites who has required intermittent abdominal paracenteses since June 2020.  Patient's last tap was on 07/03/2019 with removal of 7 L of fluid.  She presented in the middle of night with low O2 sat and tense ascites.  She had 8 L of fluid removed by Dr. Joseph Berkshire and fluid noted to be purulent.  Cultures and Gram stain obtained but not the cell count.   Patient is now on ceftriaxone.  The gross appearance of  ascitic fluid reveals milky purulent fluid consistent with spontaneous bacterial peritonitis unless she has developed chylous ascites.  Hepatic encephalopathy.  Serum ammonia is higher than her baseline and she does have asterixis.  She is on lactulose.  She would benefit from addition of Xifaxan.  Mild thrombocytopenia secondary to cirrhosis/splenomegaly. Hemoglobin is normal which is unusual given her condition.  AFP in June 2020 was normal.  It will be repeated.  Chronic kidney disease.  Recommendations;  Lab requested to do cell count with differential. Patient will continue ceftriaxone at current dose of 2 g IV every 24 hours until further information available on cultures. If cultures are negative would consider retap on 07/14/2019. Xifaxan 550 mg by mouth twice daily. We will check serum ammonia in a.m. AFP in am.   LOS: 0 days   Alysen Smylie  07/12/2019, 3:38 PM

## 2019-07-12 NOTE — ED Notes (Signed)
Pt sleeping at this time.

## 2019-07-12 NOTE — TOC Initial Note (Signed)
Transition of Care [facility]) - Initial/Assessment Note    Patient Details  Name: Penny Martinez MRN: [id number] Date of Birth: 1950-12-16  Transition of Care [facility]) CM/SW Contact:    Shade Flood, LCSW Phone Number: 07/12/2019, 10:25 AM  Clinical Narrative:                  Pt was just recently discharged from [facility] (10/28) to [facility] for short term rehab. Full assessment was completed at the time of that hospitalization. She is readmitted now with spontaneous bacterial peritonitis. Anticipating pt will return to [facility] when stable. She will need negative COVID results within 48 hours of her dc. Will follow and assist with TOC needs.  Expected Discharge Plan: Skilled Nursing Facility Barriers to Discharge: Continued Medical Work up   Patient Goals and CMS Choice        Expected Discharge Plan and Services Expected Discharge Plan: [place] In-house Referral: Clinical Social Work     Living arrangements for the past 2 months: Single Family Home, [place]                                      Prior Living Arrangements/Services Living arrangements for the past 2 months: [place], [place] Lives with:: Spouse Patient language and need for interpreter reviewed:: Yes Do you feel safe going back to the place where you live?: Yes      Need for Family Participation in Patient Care: Yes (Comment) Care giver support system in place?: Yes (comment) Current home services: DME Criminal Activity/Legal Involvement Pertinent to Current Situation/Hospitalization: No - Comment as needed  Activities of Daily Living Home Assistive Devices/Equipment: Gil[place] Rile (specify type) ADL Screening (condition at time of admission) Patient's cognitive ability adequate to safely complete daily activities?: Yes Is the patient deaf or have difficulty hearing?: No Does the patient have difficulty seeing, even when wearing  glasses/contacts?: No Does the patient have difficulty concentrating, remembering, or making decisions?: No Patient able to express need for assistance with ADLs?: Yes Does the patient have difficulty dressing or bathing?: No Independently performs ADLs?: Yes (appropriate for developmental age) Does the patient have difficulty walking or climbing stairs?: Yes Weakness of Legs: Both Weakness of Arms/Hands: None  Permission Sought/Granted                  Emotional Assessment Appearance:: Appears stated age Attitude/Demeanor/Rapport: Engaged Affect (typically observed): Pleasant Orientation: : Oriented to Self, Oriented to Place, Oriented to  Time, Oriented to Situation Alcohol / Substance Use: Not Applicable Psych Involvement: No (comment)  Admission diagnosis:  SBP (spontaneous bacterial peritonitis) ([place]) [K65.2] Patient Active Problem List   Diagnosis Date Noted  . Spontaneous bacterial peritonitis ([place]) 07/12/2019  . CKD stage 5 due to type 2 diabetes mellitus ([place]) 07/12/2019  . Abdominal pain 07/12/2019  . Left lower lobe pulmonary infiltrate 07/12/2019  . Hyperammonemia ([place]) 06/27/2019  . Acute on chronic renal failure ([place]) 06/27/2019  . CAP (community acquired pneumonia) 06/13/2019  . GERD (gastroesophageal reflux disease) 10/25/2018  . LUQ fullness 07/25/2018  . CKD stage 3 due to type 2 diabetes mellitus ([place]) 05/24/2018  . Hyponatremia syndrome 05/24/2018  . Other dysphagia 09/15/2017  . Uncontrolled type 2 diabetes mellitus with complication ([place]) 75/91/6384  . Mixed hyperlipidemia 04/27/2017  . Hyponatremia 04/27/2017  . Chronic diarrhea 12/15/2016  . Carpal tunnel syndrome, left   .  Trigger middle finger of left hand   . Thrombocytopenia ([place]) 11/04/2016  . Iron deficiency anemia due to chronic blood loss 01/31/2016  . Other specified hypothyroidism 09/20/2015  . Essential hypertension, benign 07/12/2015  . Morbid obesity with BMI of 40.0-44.9, adult  ([place]) 07/12/2015  . Hepatic cirrhosis ([place]) 04/02/2014  . Acute colitis 03/05/2014  . Abdominal pain, acute, left lower quadrant 03/05/2014  . Colitis 03/05/2014  . Splenomegaly, congestive, chronic 02/17/2014  . Sepsis ([place]) 02/15/2014  . Absolute anemia 02/15/2014  . Diabetes mellitus with stage 3 chronic kidney disease ([place]) 02/15/2014  . CHF (congestive heart failure) ([place]) 02/15/2014  . CKD (chronic kidney disease) 02/15/2014  . UTI (lower urinary tract infection) 02/15/2014   PCP:  Vidal Schwalbe, MD Pharmacy:   [place], [place] - [address] [place] [address] Phone: [phone] Fax: [phone]  [place], [place] - [address] [place] [address] Phone: [phone] Fax: [phone]     Social Determinants of Health ([place]) Interventions    Readmission Risk Interventions Readmission Risk Prevention Plan 07/12/2019 06/28/2019 06/15/2019  Transportation Screening Complete Complete -  PCP or Specialist Appt within 3-5 Days - - Complete  HRI or [place] - Complete Complete  Social Work Consult for [place] Planning/Counseling - Complete -  Palliative Care Screening - Not Applicable -  Medication Review Press photographer) Complete Complete -  Some recent data might be hidden

## 2019-07-13 DIAGNOSIS — K652 Spontaneous bacterial peritonitis: Secondary | ICD-10-CM

## 2019-07-13 DIAGNOSIS — R188 Other ascites: Secondary | ICD-10-CM

## 2019-07-13 DIAGNOSIS — K7460 Unspecified cirrhosis of liver: Secondary | ICD-10-CM

## 2019-07-13 LAB — CBC
HCT: 39.4 % (ref 36.0–46.0)
Hemoglobin: 12.3 g/dL (ref 12.0–15.0)
MCH: 26.4 pg (ref 26.0–34.0)
MCHC: 31.2 g/dL (ref 30.0–36.0)
MCV: 84.5 fL (ref 80.0–100.0)
Platelets: 134 10*3/uL — ABNORMAL LOW (ref 150–400)
RBC: 4.66 MIL/uL (ref 3.87–5.11)
RDW: 22.8 % — ABNORMAL HIGH (ref 11.5–15.5)
WBC: 8.2 10*3/uL (ref 4.0–10.5)
nRBC: 0 % (ref 0.0–0.2)

## 2019-07-13 LAB — COMPREHENSIVE METABOLIC PANEL
ALT: 17 U/L (ref 0–44)
AST: 26 U/L (ref 15–41)
Albumin: 2.8 g/dL — ABNORMAL LOW (ref 3.5–5.0)
Alkaline Phosphatase: 94 U/L (ref 38–126)
Anion gap: 10 (ref 5–15)
BUN: 73 mg/dL — ABNORMAL HIGH (ref 8–23)
CO2: 21 mmol/L — ABNORMAL LOW (ref 22–32)
Calcium: 8.3 mg/dL — ABNORMAL LOW (ref 8.9–10.3)
Chloride: 102 mmol/L (ref 98–111)
Creatinine, Ser: 2.59 mg/dL — ABNORMAL HIGH (ref 0.44–1.00)
GFR calc Af Amer: 21 mL/min — ABNORMAL LOW (ref >60)
GFR calc non Af Amer: 18 mL/min — ABNORMAL LOW (ref >60)
Glucose, Bld: 263 mg/dL — ABNORMAL HIGH (ref 70–99)
Potassium: 4.2 mmol/L (ref 3.5–5.1)
Sodium: 133 mmol/L — ABNORMAL LOW (ref 135–145)
Total Bilirubin: 0.6 mg/dL (ref 0.3–1.2)
Total Protein: 5.5 g/dL — ABNORMAL LOW (ref 6.5–8.1)

## 2019-07-13 LAB — GLUCOSE, CAPILLARY
Glucose-Capillary: 447 mg/dL — ABNORMAL HIGH (ref 70–99)
Glucose-Capillary: 240 mg/dL — ABNORMAL HIGH (ref 70–99)
Glucose-Capillary: 259 mg/dL — ABNORMAL HIGH (ref 70–99)
Glucose-Capillary: 172 mg/dL — ABNORMAL HIGH (ref 70–99)
Glucose-Capillary: 184 mg/dL — ABNORMAL HIGH (ref 70–99)

## 2019-07-13 NOTE — Progress Notes (Signed)
Subjective:  Patient feels weak. Complains of leg cramps that kept her up all night. Abdominal distention recurring but no significant pain.  Objective: Vital signs in last 24 hours: Temp:  [97.6 F (36.4 C)-98 F (36.7 C)] 97.6 F (36.4 C) (11/05 0600) Pulse Rate:  [93-97] 93 (11/05 0600) Resp:  [18-20] 20 (11/05 0600) BP: (88-125)/(60-78) 125/60 (11/05 0600) SpO2:  [96 %-100 %] 96 % (11/05 0600) Last BM Date: 07/12/19 General:   Alert,  Frail, pleasant and cooperative in NAD Head:  Normocephalic and atraumatic. Eyes:  Sclera clear, no icterus.  Abdomen:  Soft, mild distention but not tense. nontender except at site of recent tap. Normal bowel sounds, without guarding, and without rebound.   Extremities:  Trace bilateral edema. without clubbing, deformity. Neurologic:  Alert and  oriented x4;  +asterixis. Psych:  Alert and cooperative. Normal mood and affect.  Intake/Output from previous day: 11/04 0701 - 11/05 0700 In: 720 [P.O.:720] Out: 200 [Urine:200] Intake/Output this shift: Total I/O In: -  Out: 200 [Urine:200]  Lab Results: CBC Recent Labs    07/12/19 0147 07/13/19 0520  WBC 8.5 8.2  HGB 11.6* 12.3  HCT 36.8 39.4  MCV 84.6 84.5  PLT 140* 134*   BMET Recent Labs    07/12/19 0147 07/13/19 0520  NA 129* 133*  K 4.2 4.2  CL 98 102  CO2 21* 21*  GLUCOSE 308* 263*  BUN 72* 73*  CREATININE 2.49* 2.59*  CALCIUM 8.1* 8.3*   LFTs Recent Labs    07/12/19 0147 07/13/19 0520  BILITOT 0.6 0.6  ALKPHOS 114 94  AST 29 26  ALT 19 17  PROT 5.9* 5.5*  ALBUMIN 2.7* 2.8*   No results for input(s): LIPASE in the last 72 hours. PT/INR Recent Labs    07/12/19 0147  LABPROT 17.0*  INR 1.4*      Imaging Studies: US Renal  Result Date: 07/03/2019 CLINICAL DATA:  Acute on chronic renal failure EXAM: RENAL / URINARY TRACT ULTRASOUND COMPLETE COMPARISON:  08/11/2018 FINDINGS: Right Kidney: Renal measurements: 12.1 x 4.2 x 6.3 cm = volume: 166 mL .  Echogenicity within normal limits. No mass or hydronephrosis visualized. Left Kidney: Renal measurements: 9.9 x 4.6 x 4.2 cm = volume: 100 mL. Examination is somewhat limited secondary to poor acoustic windows. Echogenicity appears within normal limits. No mass or hydronephrosis visualized. Bladder: Appears normal for degree of bladder distention. Other: Small volume free fluid within the upper abdomen. IMPRESSION: 1. Negative for obstructive uropathy. 2. Small volume residual free fluid within the upper abdomen. Electronically Signed   By: Davina Poke M.D.   On: 07/03/2019 12:44   US Paracentesis  Result Date: 07/03/2019 INDICATION: Ascites EXAM: ULTRASOUND GUIDED  PARACENTESIS MEDICATIONS: None. COMPLICATIONS: None immediate. PROCEDURE: Informed written consent was obtained from the patient after a discussion of the risks, benefits and alternatives to treatment. A timeout was performed prior to the initiation of the procedure. Initial ultrasound scanning demonstrates a large amount of ascites within the right lower abdominal quadrant. The right lower abdomen was prepped and draped in the usual sterile fashion. 1% lidocaine was used for local anesthesia. Following this, a 19 gauge, 7-cm, Yueh catheter was introduced. An ultrasound image was saved for documentation purposes. The paracentesis was performed. The catheter was removed and a dressing was applied. The patient tolerated the procedure well without immediate post procedural complication. FINDINGS: A total of approximately 7 liters of serosanguineous fluid was removed. IMPRESSION: Successful ultrasound-guided paracentesis yielding 7 liters  of peritoneal fluid. Electronically Signed   By: Rolm Baptise M.D.   On: 07/03/2019 12:32   US Paracentesis  Result Date: 06/21/2019 INDICATION: Ascites EXAM: ULTRASOUND GUIDED  PARACENTESIS MEDICATIONS: None. COMPLICATIONS: None immediate. PROCEDURE: Informed written consent was obtained from the patient  after a discussion of the risks, benefits and alternatives to treatment. A timeout was performed prior to the initiation of the procedure. Initial ultrasound scanning demonstrates a large amount of ascites within the right lower abdominal quadrant. The right lower abdomen was prepped and draped in the usual sterile fashion. 1% lidocaine was used for local anesthesia. Following this, a 19 gauge, 7-cm, Yueh catheter was introduced. An ultrasound image was saved for documentation purposes. The paracentesis was performed. The catheter was removed and a dressing was applied. The patient tolerated the procedure well without immediate post procedural complication. Patient received post-procedure intravenous albumin; see nursing notes for details. FINDINGS: A total of approximately 7.2 liters of clear yellow fluid was removed. Samples were sent to the laboratory as requested by the clinical team. IMPRESSION: Successful ultrasound-guided paracentesis yielding 7.2 liters of peritoneal fluid. Electronically Signed   By: Rolm Baptise M.D.   On: 06/21/2019 15:07   Dg Chest Port 1 View  Result Date: 07/12/2019 CLINICAL DATA:  Shortness of breath EXAM: PORTABLE CHEST 1 VIEW COMPARISON:  06/13/2019 FINDINGS: Cardiomegaly. Left pleural effusion with left lower lobe atelectasis or infiltrate. No confluent opacity on the right. No edema. No acute bony abnormality. IMPRESSION: Left pleural effusion with left lower lobe atelectasis or infiltrate/pneumonia. Electronically Signed   By: Rolm Baptise M.D.   On: 07/12/2019 01:53   Dg Chest Port 1 View  Result Date: 06/13/2019 CLINICAL DATA:  Cough and dyspnea EXAM: PORTABLE CHEST 1 VIEW COMPARISON:  05/23/2018 chest radiograph. FINDINGS: Stable cardiomediastinal silhouette with top-normal heart size. No pneumothorax. Small left pleural effusion. No right pleural effusion. Patchy left lung base consolidation. No pulmonary edema. IMPRESSION: 1. Patchy left lung base consolidation  suspicious for pneumonia. Recommend follow-up PA and lateral post treatment chest radiographs in 4-6 weeks. 2. Small left pleural effusion. Electronically Signed   By: Ilona Sorrel M.D.   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  She has had a total of 6 paracenteses, the last 3 none in the last 4 weeks.  Prior to this admission, patient's last tap on October 26 with removal of 7 L of fluid.    Presented to the ED for tense ascites and low O2 sats.  While in the ED she had 8 L of fluid removed by ED provider, noted to be purulent.  Cultures and Gram stain obtained but the cell count was not done initially.  Started on ceftriaxone.  Dr. Laural Golden located specimen in the lab, reports that specimen was milky purulent fluid suspicious for SBP versus chylous ascites.  Cell count was obtained and not consistent with SBP.  Hepatic encephalopathy.  Serum ammonia higher than her baseline, evidence of asterixis.  On lactulose with 3 BMs last 24 hours.  Xifaxan added.  Mild thrombocytopenia secondary to cirrhosis/splenomegaly.  Hemoglobin normal.  AFP in June was normal.  Repeated yesterday, results pending.  Chronic renal disease. Creatinine stable.   Liver transplant candidacy: Not considered a transplant candidate due to comorbidities and  clinical status, previously discussed with local liver transplant center. MELD 25  Plan: 1. Follow-up triglyceride level on fluid (called lab to check on status and awaiting call back).  2. F/U path smear review 3. Continue ceftriaxone until further information available on cultures.  If cultures remain negative, consider consider retap tomorrow with cell count and cultures. 4. Xifaxan 500  mg twice daily. 5. Follow-up pending labs. 6. Strict I/Os.   Laureen Ochs. Bernarda Caffey [facility] [phone] 11/5/202010:43 AM    LOS: 1 day

## 2019-07-13 NOTE — Progress Notes (Signed)
Noted patient has not urinated all night. Patient has not drank much and is on a 1200 ml fluid restriction. Abdomen is distended and larger than yesterday evening. Last urine documented was 07/12/19 at 2 pm

## 2019-07-13 NOTE — Progress Notes (Signed)
Patient had 200 ml output, amber in color, clear.

## 2019-07-13 NOTE — Progress Notes (Signed)
PROGRESS NOTE    RHIANA MORASH  LPF:[id number] DOB: Apr 22, 1951 DOA: 07/12/2019 PCP: Vidal Schwalbe, MD   Brief Narrative:  Per HPI: [place] Filice Thompsonis a 68 y.o.femalewith medical history significantforgastroesophageal reflux disease,chronicdiastolic heart failure,NASH/cirrhosis, morbid obesity, dyslipidemia, hypertension, type 2 diabetes mellitus with nephropathy, chronic kidney disease stage III, hypothyroidism and a sleep apnea; who presented to the emergency departmentwith complaints of abdominal pain due to abdominal distention secondary to decompensated liver cirrhosis. She denies nausea, emesis, constipation, melena or hematochezia. No dysuria, frequency or hematuria. She denies fever, chills, rhinorrhea, sore throat, productive cough, wheezing or hemoptysis. She occasionally gets dyspneic. She denies chest pain, palpitations, dizziness, diaphoresis, PND, but sometimes gets orthopneic and develops lower extremity edema. No polyuria, polydipsia, polyphagia or blurred vision.  11/4: Patient was admitted for SBP and has undergone paracentesis of 8.1 L of fluid.  She is feeling overall much better and does not appear to have any signs or symptoms of pneumonia with noted left lower lobe pulmonary infiltrate.  She remains on Rocephin 2 g IV every 24 hours with fluid culture and further results pending.  Consult to GI for further assistance and evaluation management.  Patient states that she has not been taking diuretics recently.  It appears, that these diuretics to include torsemide and Aldactone were discontinued on recent discharge at 10/27 due to AKI with some concern for hepatorenal syndrome.  She is supposed to follow-up with her nephrologist Dr. Juleen China of [place] soon.  11/5: Appreciate GI recommendations with follow-up on further fluid analysis noted.  Continues on Rocephin.  Feels weak today.  She is noted to have some elevated ammonia levels and hepatic encephalopathy for  which she has been started on Xifaxan today in addition to her lactulose.  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 if cultures remain negative -Strict I's and O's  Hepatic encephalopathy -Continue lactulose with Xifaxan added per GI today -Follow-up ammonia levels  Hypothyroidism -Continue Synthroid  Type 2 diabetes with hyperglycemia -Continue current insulin dosing -Poor appetite currently noted and will not go up on SSI for now  Mixed dyslipidemia -Continue Zetia daily  Hyponatremia secondary to cirrhosis -Sodium levels are improved today -Recheck levels in a.m.  GERD -PPI  Mild thrombocytopenia secondary to cirrhosis/splenomegaly -Repeat CBC monitoring -AFP pending -Poor candidate for liver transplant  CKD stage III -We will need outpatient renal follow-up -Currently with stable creatinine   DVT prophylaxis: SCDs Code Status: Full Family Communication: Husband at bedside Disposition Plan: Continue IV antibiotic treatment.  Appreciate GI recommendations and follow-up ascites fluid analysis.   Consultants:   GI  Procedures:   Paracentesis with 8.1 L fluid removed on 11/4  Antimicrobials:  Anti-infectives (From admission, onward)   Start     Dose/Rate Route Frequency Ordered Stop   07/12/19 1800  rifaximin (XIFAXAN) tablet 550 mg     550 mg Oral 2 times  daily 07/12/19 1602     07/12/19  0415  cefTRIAXone (ROCEPHIN) 2 g in sodium chloride 0.9 % 100 mL IVPB     2 g 200 mL/hr over 30 Minutes Intravenous Every 24 hours 07/12/19 0404         Subjective: Patient seen and evaluated today with no new acute complaints or concerns. No acute concerns or events noted overnight.  She is feeling weak and has poor appetite today.  Objective: Vitals:   07/12/19 0552 07/12/19 1327 07/12/19 2044 07/13/19 0600  BP: (!) 103/31 (!) 88/78  125/60  Pulse:  97  93  Resp: 16 18  20   Temp: 98 F (36.7 C) 98 F (36.7 C)  97.6 F (36.4 C)  TempSrc: Oral Oral  Oral  SpO2: 98% 100% 96% 96%  Weight: 97.1 kg     Height: 5' 1"  (1.549 m)       Intake/Output Summary (Last 24 hours) at 07/13/2019 1212 Last data filed at 07/13/2019 0721 Gross per 24 hour  Intake 480 ml  Output 400 ml  Net 80 ml   Filed Weights   07/12/19 0050 07/12/19 0552  Weight: 101.6 kg 97.1 kg    Examination:  General exam: Appears calm and comfortable  Respiratory system: Clear to auscultation. Respiratory effort normal. Cardiovascular system: S1 & S2 heard, RRR. No JVD, murmurs, rubs, gallops or clicks. No pedal edema. Gastrointestinal system: Abdomen is minimally distended, soft and nontender. No organomegaly or masses felt. Normal bowel sounds heard. Central nervous system: Alert and oriented. No focal neurological deficits. Extremities: Symmetric 5 x 5 power. Skin: No rashes, lesions or ulcers Psychiatry: Judgement and insight appear normal. Mood & affect appropriate.     Data Reviewed: I have personally reviewed following labs and imaging studies  CBC: Recent Labs  Lab 07/12/19 0147 07/13/19 0520  WBC 8.5 8.2  NEUTROABS 6.7  --   HGB 11.6* 12.3  HCT 36.8 39.4  MCV 84.6 84.5  PLT 140* 778*   Basic Metabolic Panel: Recent Labs  Lab 07/12/19 0147 07/13/19 0520  NA 129* 133*  K 4.2 4.2  CL 98 102  CO2 21* 21*  GLUCOSE 308* 263*  BUN 72* 73*  CREATININE  2.49* 2.59*  CALCIUM 8.1* 8.3*   GFR: Estimated Creatinine Clearance: 22.2 mL/min (A) (by C-G formula based on SCr of 2.59 mg/dL (H)). Liver Function Tests: Recent Labs  Lab 07/12/19 0147 07/13/19 0520  AST 29 26  ALT 19 17  ALKPHOS 114 94  BILITOT 0.6 0.6  PROT 5.9* 5.5*  ALBUMIN 2.7* 2.8*   No results for input(s): LIPASE, AMYLASE in the last 168 hours. Recent Labs  Lab 07/12/19 0147  AMMONIA 116*   Coagulation Profile: Recent Labs  Lab 07/12/19 0147  INR 1.4*   Cardiac Enzymes: No results for input(s): CKTOTAL, CKMB, CKMBINDEX, TROPONINI in the last 168 hours. BNP (last 3 results) No results for input(s): PROBNP in the last 8760 hours. HbA1C: No results for input(s): HGBA1C in the last 72 hours. CBG: Recent Labs  Lab 07/12/19 1104 07/12/19 1607 07/12/19 2051 07/13/19 0725 07/13/19 1201  GLUCAP 274* 370* 447* 240* 259*   Lipid Profile: No results for input(s): CHOL, HDL, LDLCALC, TRIG, CHOLHDL, LDLDIRECT in the last 72 hours. Thyroid Function Tests: No results for input(s): TSH, T4TOTAL, FREET4, T3FREE, THYROIDAB in the last 72 hours. Anemia Panel: No results for input(s): VITAMINB12, FOLATE, FERRITIN, TIBC, IRON, RETICCTPCT in the last 72 hours. Sepsis Labs: No results for input(s): PROCALCITON, LATICACIDVEN in the last 168 hours.  Recent Results (  from the past 240 hour(s))  SARS CORONAVIRUS 2 (TAT 6-24 HRS) Nasopharyngeal Nasopharyngeal Swab     Status: None   Collection Time: 07/03/19  2:16 PM   Specimen: Nasopharyngeal Swab  Result Value Ref Range Status   SARS Coronavirus 2 NEGATIVE NEGATIVE Final    Comment: (NOTE) SARS-CoV-2 target nucleic acids are NOT DETECTED. The SARS-CoV-2 RNA is generally detectable in upper and lower respiratory specimens during the acute phase of infection. Negative results do not preclude SARS-CoV-2 infection, do not rule out co-infections with other pathogens, and should not be used as the sole basis for treatment  or other patient management decisions. Negative results must be combined with clinical observations, patient history, and epidemiological information. The expected result is Negative. Fact Sheet for Patients: SugarRoll.be Fact Sheet for Healthcare Providers: https://www.woods-mathews.com/ This test is not yet approved or cleared by the [place] FDA and  has been authorized for detection and/or diagnosis of SARS-CoV-2 by FDA under an Emergency Use Authorization (EUA). This EUA will remain  in effect (meaning this test can be used) for the duration of the COVID-19 declaration under Section 56 4(b)(1) of the Act, 21 U.S.C. section 360bbb-3(b)(1), unless the authorization is terminated or revoked sooner. Performed at [facility], [place] [address], [place], [place] [address]   Culture, Urine     Status: Abnormal   Collection Time: 07/03/19  6:39 PM   Specimen: Urine, Clean Catch  Result Value Ref Range Status   Specimen Description   Final    URINE, CLEAN CATCH Performed at [facility], [address] [place], [place] [address]    Special Requests   Final    Normal Performed at [facility], [address], [place], [place] [address]    Culture MULTIPLE SPECIES PRESENT, SUGGEST RECOLLECTION (A)  Final   Report Status 07/05/2019 FINAL  Final  Body fluid culture     Status: None (Preliminary result)   Collection Time: 07/12/19  3:22 AM   Specimen: Peritoneal Cavity; Peritoneal Fluid  Result Value Ref Range Status   Specimen Description   Final    PERITONEAL CAVITY Performed at [facility], [address] [place], [place] [address]    Special Requests   Final    NONE Performed at [facility], [address], [place], [place] [address]    Gram Stain   Final    FEW WBC PRESENT, PREDOMINANTLY PMN NO ORGANISMS SEEN    Culture   Final    NO GROWTH < 24 HOURS Performed at [place] [address] [place], [place]  [address]    Report Status PENDING  Incomplete  SARS CORONAVIRUS 2 (TAT 6-24 HRS) Nasopharyngeal Nasopharyngeal Swab     Status: None   Collection Time: 07/12/19  5:30 AM   Specimen: Nasopharyngeal Swab  Result Value Ref Range Status   SARS Coronavirus 2 NEGATIVE NEGATIVE Final    Comment: (NOTE) SARS-CoV-2 target nucleic acids are NOT DETECTED. The SARS-CoV-2 RNA is generally detectable in upper and lower respiratory specimens during the acute phase of infection. Negative results do not preclude SARS-CoV-2 infection, do not rule out co-infections with other pathogens, and should not be used as the sole basis for treatment or other patient management decisions. Negative results must be combined with clinical observations, patient history, and epidemiological information. The expected result is Negative. Fact Sheet for Patients: SugarRoll.be Fact Sheet for Healthcare Providers: https://www.woods-mathews.com/ This test is not yet approved or cleared by the [place]  FDA and  has been authorized for detection and/or diagnosis of SARS-CoV-2 by FDA under an Emergency Use Authorization (EUA). This EUA will remain  in effect (meaning this test can be used) for the duration of the COVID-19 declaration under Section 56 4(b)(1) of the Act, 21 U.S.C. section 360bbb-3(b)(1), unless the authorization is terminated or revoked sooner. Performed at [facility], [place] [address], [place], [place] [address]          Radiology Studies: Dg Chest Port 1 View  Result Date: 07/12/2019 CLINICAL DATA:  Shortness of breath EXAM: PORTABLE CHEST 1 VIEW COMPARISON:  06/13/2019 FINDINGS: Cardiomegaly. Left pleural effusion with left lower lobe atelectasis or infiltrate. No confluent opacity on the right. No edema. No acute bony abnormality. IMPRESSION: Left pleural effusion with left lower lobe atelectasis or infiltrate/pneumonia. Electronically Signed   By:  Rolm Baptise M.D.   On: 07/12/2019 01:53        Scheduled Meds: . amitriptyline  25 mg Oral QHS  . aspirin EC  81 mg Oral Q breakfast  . donepezil  5 mg Oral QHS  . ezetimibe  10 mg Oral q morning - 10a  . ferrous sulfate  325 mg Oral Daily  . gabapentin  100 mg Oral QHS  . insulin aspart  0-9 Units Subcutaneous TID WC  . insulin glargine  24 Units Subcutaneous BID  . lactulose  30 g Oral BID  . levothyroxine  100 mcg Oral QAC breakfast  . pantoprazole  40 mg Oral Daily  . rifaximin  550 mg Oral BID   Continuous Infusions: . cefTRIAXone (ROCEPHIN)  IV 2 g (07/13/19 0536)     LOS: 1 day    Time spent: 30 minutes    Pratik Darleen Crocker, DO [facility] Pager [phone]  If 7PM-7AM, please contact night-coverage [email or web address] Password [email or web address] 07/13/2019, 12:12 PM

## 2019-07-13 NOTE — Progress Notes (Signed)
Results for SOLIANA, KITKO (MRN [id number]) as of 07/13/2019 09:00  Ref. Range 07/12/2019 07:53 07/12/2019 11:04 07/12/2019 16:07 07/13/2019 07:25  Glucose-Capillary Latest Ref Range: 70 - 99 mg/dL 219 (H) 274 (H) 370 (H) 240 (H)  Noted that blood sugars have been greater than 180 mg/dl.  Recommend increasing Novolog correction scale to RESISTANT (0-20 units) TID if blood sugars continue to be elevated.   Harvel Ricks RN BSN CDE Diabetes Coordinator Pager: [phone]  8am-5pm

## 2019-07-14 DIAGNOSIS — N185 Chronic kidney disease, stage 5: Secondary | ICD-10-CM

## 2019-07-14 DIAGNOSIS — R1084 Generalized abdominal pain: Secondary | ICD-10-CM

## 2019-07-14 DIAGNOSIS — E1122 Type 2 diabetes mellitus with diabetic chronic kidney disease: Secondary | ICD-10-CM

## 2019-07-14 DIAGNOSIS — K219 Gastro-esophageal reflux disease without esophagitis: Secondary | ICD-10-CM

## 2019-07-14 DIAGNOSIS — E871 Hypo-osmolality and hyponatremia: Secondary | ICD-10-CM

## 2019-07-14 LAB — PATHOLOGIST SMEAR REVIEW

## 2019-07-14 LAB — COMPREHENSIVE METABOLIC PANEL
ALT: 19 U/L (ref 0–44)
AST: 37 U/L (ref 15–41)
Albumin: 2.7 g/dL — ABNORMAL LOW (ref 3.5–5.0)
Alkaline Phosphatase: 91 U/L (ref 38–126)
Anion gap: 10 (ref 5–15)
BUN: 76 mg/dL — ABNORMAL HIGH (ref 8–23)
CO2: 21 mmol/L — ABNORMAL LOW (ref 22–32)
Calcium: 8.5 mg/dL — ABNORMAL LOW (ref 8.9–10.3)
Chloride: 102 mmol/L (ref 98–111)
Creatinine, Ser: 2.61 mg/dL — ABNORMAL HIGH (ref 0.44–1.00)
GFR calc Af Amer: 21 mL/min — ABNORMAL LOW (ref >60)
GFR calc non Af Amer: 18 mL/min — ABNORMAL LOW (ref >60)
Glucose, Bld: 197 mg/dL — ABNORMAL HIGH (ref 70–99)
Potassium: 4.6 mmol/L (ref 3.5–5.1)
Sodium: 133 mmol/L — ABNORMAL LOW (ref 135–145)
Total Bilirubin: 0.9 mg/dL (ref 0.3–1.2)
Total Protein: 5.5 g/dL — ABNORMAL LOW (ref 6.5–8.1)

## 2019-07-14 LAB — GLUCOSE, CAPILLARY
Glucose-Capillary: 174 mg/dL — ABNORMAL HIGH (ref 70–99)
Glucose-Capillary: 258 mg/dL — ABNORMAL HIGH (ref 70–99)
Glucose-Capillary: 226 mg/dL — ABNORMAL HIGH (ref 70–99)
Glucose-Capillary: 177 mg/dL — ABNORMAL HIGH (ref 70–99)

## 2019-07-14 LAB — CBC
HCT: 38.4 % (ref 36.0–46.0)
Hemoglobin: 12.1 g/dL (ref 12.0–15.0)
MCH: 26.9 pg (ref 26.0–34.0)
MCHC: 31.5 g/dL (ref 30.0–36.0)
MCV: 85.3 fL (ref 80.0–100.0)
Platelets: 139 10*3/uL — ABNORMAL LOW (ref 150–400)
RBC: 4.5 MIL/uL (ref 3.87–5.11)
RDW: 22.9 % — ABNORMAL HIGH (ref 11.5–15.5)
WBC: 9.6 10*3/uL (ref 4.0–10.5)
nRBC: 0 % (ref 0.0–0.2)

## 2019-07-14 LAB — AMMONIA: Ammonia: 103 umol/L — ABNORMAL HIGH (ref 9–35)

## 2019-07-14 LAB — TRIGLYCERIDES, BODY FLUIDS: Triglycerides, Fluid: 397 mg/dL

## 2019-07-14 LAB — AFP TUMOR MARKER: AFP, Serum, Tumor Marker: <0.9 ng/mL (ref 0.0–8.3)

## 2019-07-14 NOTE — TOC Progression Note (Signed)
Transition of Care [facility]) - Progression Note    Patient Details  Name: Penny Martinez MRN: [id number] Date of Birth: 19-May-1951  [facility] CM/SW Contact  Shade Flood, LCSW Phone Number: 07/14/2019, 2:16 PM  Clinical Narrative:     Pt not yet stable for dc. Plan remains for return to [facility] at dc. Updated Doug at [facility]. Weekend TOC will be available if needed. Will follow up Monday if pt remains hospitalized at that time.  Expected Discharge Plan: [place] Barriers to Discharge: Continued Medical Work up  Expected Discharge Plan and Services Expected Discharge Plan: [place] In-house Referral: Clinical Social Work     Living arrangements for the past 2 months: [place], [place]                                       Social Determinants of Health (SDOH) Interventions    Readmission Risk Interventions Readmission Risk Prevention Plan 07/12/2019 06/28/2019 06/15/2019  Transportation Screening Complete Complete -  PCP or Specialist Appt within 3-5 Days - - Complete  HRI or [place] - Complete Complete  Social Work Consult for [place] Planning/Counseling - Complete -  Palliative Care Screening - Not Applicable -  Medication Review Press photographer) Complete Complete -  Some recent data might be hidden

## 2019-07-14 NOTE — Care Management Important Message (Signed)
Important Message  Patient Details  Name: Penny Martinez MRN: [id number] Date of Birth: 1951-02-02   Medicare Important Message Given:  Yes(given to receptionist to deliver to patient due to precautions)     Tommy Medal 07/14/2019, 3:25 PM

## 2019-07-14 NOTE — Progress Notes (Signed)
PROGRESS NOTE    [name]  ZOX:[id number] DOB: 10-Oct-1950 DOA: 07/12/2019 PCP: [name], MD   Brief Narrative:  Per HPI: Penny Martinez a 68 y.o.femalewith medical history significantforgastroesophageal reflux disease,chronicdiastolic heart failure,NASH/cirrhosis, morbid obesity, dyslipidemia, hypertension, type 2 diabetes mellitus with nephropathy, chronic kidney disease stage III, hypothyroidism and a sleep apnea; who presented to the emergency departmentwith complaints of abdominal pain due to abdominal distention secondary to decompensated liver cirrhosis. She denies nausea, emesis, constipation, melena or hematochezia. No dysuria, frequency or hematuria. She denies fever, chills, rhinorrhea, sore throat, productive cough, wheezing or hemoptysis. She occasionally gets dyspneic. She denies chest pain, palpitations, dizziness, diaphoresis, PND, but sometimes gets orthopneic and develops lower extremity edema. No polyuria, polydipsia, polyphagia or blurred vision.  11/4:Patient was admitted for SBP and has undergone paracentesis of 8.1 L of fluid. She is feeling overall much better and does not appear to have any signs or symptoms of pneumonia with noted left lower lobe pulmonary infiltrate. She remains on Rocephin 2 g IV every 24 hours with fluid culture and further results pending. Consult to GI for further assistance and evaluation management. Patient states that she has not been taking diuretics recently. It appears, that these diuretics to include torsemide and Aldactone were discontinued on recent discharge at 10/27 due to AKI with some concern for hepatorenal syndrome. She is supposed to follow-up with her nephrologist Dr. Wynelle Link of [facility] soon.  11/5: Appreciate GI recommendations with follow-up on further fluid analysis noted.  Continues on Rocephin.  Feels weak today.  She is noted to have some elevated ammonia levels and hepatic encephalopathy for  which she has been started on Xifaxan today in addition to her lactulose.  11/6: Appreciate GI recommendations with a ascites triglyceride level 397 with smear review pending.  Continue lactulose and Xifaxan as patient still has hepatic encephalopathy.  Will discharge back to [facility] [place] once ready for discharge.  Follow-up a.m. ammonia levels.  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  Hypothyroidism -Continue Synthroid  Type 2 diabetes with hyperglycemia-improving -Continue current insulin dosing -Poor appetite currently noted and will not go up on SSI for now  Mixed dyslipidemia -Continue Zetia daily  Hyponatremia secondary to cirrhosis -Sodium levels are stable today -Recheck levels in a.m.  GERD -PPI  Mild thrombocytopenia secondary to cirrhosis/splenomegaly-stable -Repeat CBC monitoring -AFP less than 0.9 -Poor candidate for liver transplant  CKD stage III -We will need outpatient renal follow-up -Currently with stable creatinine   DVT prophylaxis: SCDs Code Status: Full Family Communication: Husband at bedside Disposition Plan:  No further IV antibiotics at this time.  Appreciate GI recommendations.  Continue treat hepatic encephalopathy  with plans to transfer back to SNF once stable.   Consultants:   GI  Procedures:   Paracentesis with 8.1 L fluid removed on 11/4  Antimicrobials:  Anti-infectives (From admission, onward)   Start     Dose/Rate Route Frequency Ordered Stop   07/12/19 1800  rifaximin (XIFAXAN) tablet 550 mg     550 mg Oral 2 times daily 07/12/19 1602     07/12/19 0415  cefTRIAXone (ROCEPHIN) 2 g in sodium chloride 0.9 % 100 mL IVPB  Status:  Discontinued     2 g 200 mL/hr over 30 Minutes Intravenous Every 24 hours 07/12/19 0404 07/14/19 1302       Subjective: Patient seen and evaluated today with no new acute complaints or concerns. No acute concerns or events noted overnight.  She still appears quite lethargic and confused and has very little appetite.  Objective: Vitals:   07/12/19 2044 07/13/19 0600 07/13/19 2128 07/14/19 0543  BP:  125/60 111/61 106/65  Pulse:  93 92 92  Resp:  20 20 20   Temp:  97.6 F (36.4 C) 97.7 F (36.5 C) 98 F (36.7 C)  TempSrc:  Oral    SpO2: 96% 96% 96% 90%  Weight:      Height:        Intake/Output Summary (Last 24 hours) at 07/14/2019 1600 Last data filed at 07/13/2019 1900 Gross per 24 hour  Intake 240 ml  Output -  Net 240 ml   Filed Weights   07/12/19 0050 07/12/19 0552  Weight: 101.6 kg 97.1 kg    Examination:  General exam: Appears calm and comfortable  Respiratory system: Clear to auscultation. Respiratory effort normal. Cardiovascular system: S1 & S2 heard, RRR. No JVD, murmurs, rubs, gallops or clicks. No pedal edema. Gastrointestinal system: Abdomen is minimally distended, soft and nontender. No organomegaly or masses felt. Normal bowel sounds heard. Central nervous system: Alert and awake. Extremities: Symmetric 5 x 5 power. Skin: No rashes, lesions or ulcers Psychiatry: Difficult to assess given current condition.    Data Reviewed: I have personally reviewed following labs and imaging studies  CBC: Recent Labs  Lab  07/12/19 0147 07/13/19 0520 07/14/19 0704  WBC 8.5 8.2 9.6  NEUTROABS 6.7  --   --   HGB 11.6* 12.3 12.1  HCT 36.8 39.4 38.4  MCV 84.6 84.5 85.3  PLT 140* 134* 139*   Basic Metabolic Panel: Recent Labs  Lab 07/12/19 0147 07/13/19 0520 07/14/19 0704  NA 129* 133* 133*  K 4.2 4.2 4.6  CL 98 102 102  CO2 21* 21* 21*  GLUCOSE 308* 263* 197*  BUN 72* 73* 76*  CREATININE 2.49* 2.59* 2.61*  CALCIUM 8.1* 8.3* 8.5*   GFR: Estimated Creatinine Clearance: 22 mL/min (A) (by C-G formula based on SCr of 2.61 mg/dL (H)). Liver Function Tests: Recent Labs  Lab 07/12/19 0147 07/13/19 0520 07/14/19 0704  AST 29 26 37  ALT 19 17 19   ALKPHOS 114 94 91  BILITOT 0.6 0.6 0.9  PROT 5.9* 5.5* 5.5*  ALBUMIN 2.7* 2.8* 2.7*   No results for input(s): LIPASE, AMYLASE in the last 168 hours. Recent Labs  Lab 07/12/19 0147 07/14/19 0704  AMMONIA 116* 103*   Coagulation Profile: Recent Labs  Lab 07/12/19 0147  INR 1.4*   Cardiac Enzymes: No results for input(s): CKTOTAL, CKMB, CKMBINDEX, TROPONINI in the last 168 hours. BNP (last 3 results) No results for input(s): PROBNP in the last 8760 hours. HbA1C: No results for input(s): HGBA1C in the last 72 hours. CBG: Recent Labs  Lab 07/13/19 1201 07/13/19 1637 07/13/19 2129 07/14/19 0747 07/14/19 1157  GLUCAP 259* 172* 184* 174* 258*   Lipid  Profile: No results for input(s): CHOL, HDL, LDLCALC, TRIG, CHOLHDL, LDLDIRECT in the last 72 hours. Thyroid Function Tests: No results for input(s): TSH, T4TOTAL, FREET4, T3FREE, THYROIDAB in the last 72 hours. Anemia Panel: No results for input(s): VITAMINB12, FOLATE, FERRITIN, TIBC, IRON, RETICCTPCT in the last 72 hours. Sepsis Labs: No results for input(s): PROCALCITON, LATICACIDVEN in the last 168 hours.  Recent Results (from the past 240 hour(s))  Body fluid culture     Status: None (Preliminary result)   Collection Time: 07/12/19  3:22 AM   Specimen: Peritoneal Cavity; Peritoneal  Fluid  Result Value Ref Range Status   Specimen Description   Final    PERITONEAL CAVITY Performed at [facility], [address], [place], [place] [address]    Special Requests   Final    NONE Performed at [facility], [address], [place], [place] [address]    Gram Stain   Final    FEW WBC PRESENT, PREDOMINANTLY PMN NO ORGANISMS SEEN    Culture   Final    NO GROWTH 2 DAYS Performed at [facility], [address], [place], [place] [address]    Report Status PENDING  Incomplete  SARS CORONAVIRUS 2 (TAT 6-24 HRS) Nasopharyngeal Nasopharyngeal Swab     Status: None   Collection Time: 07/12/19  5:30 AM   Specimen: Nasopharyngeal Swab  Result Value Ref Range Status   SARS Coronavirus 2 NEGATIVE NEGATIVE Final    Comment: (NOTE) SARS-CoV-2 target nucleic acids are NOT DETECTED. The SARS-CoV-2 RNA is generally detectable in upper and lower respiratory specimens during the acute phase of infection. Negative results do not preclude SARS-CoV-2 infection, do not rule out co-infections with other pathogens, and should not be used as the sole basis for treatment or other patient management decisions. Negative results must be combined with clinical observations, patient history, and epidemiological information. The expected result is Negative. Fact Sheet for Patients: HairSlick.no Fact Sheet for Healthcare Providers: quierodirigir.com This test is not yet approved or cleared by the [place] FDA and  has been authorized for detection and/or diagnosis of SARS-CoV-2 by FDA under an Emergency Use Authorization (EUA). This EUA will remain  in effect (meaning this test can be used) for the duration of the COVID-19 declaration under Section 56 4(b)(1) of the Act, 21 U.S.C. section 360bbb-3(b)(1), unless the authorization is terminated or revoked sooner. Performed at [facility], [address] [place], [place]  [address]          Radiology Studies: No results found.      Scheduled Meds: . amitriptyline  25 mg Oral QHS  . aspirin EC  81 mg Oral Q breakfast  . donepezil  5 mg Oral QHS  . ezetimibe  10 mg Oral q morning - 10a  . ferrous sulfate  325 mg Oral Daily  . gabapentin  100 mg Oral QHS  . insulin aspart  0-9 Units Subcutaneous TID WC  . insulin glargine  24 Units Subcutaneous BID  . lactulose  30 g Oral BID  . levothyroxine  100 mcg Oral QAC breakfast  . pantoprazole  40 mg Oral Daily  . rifaximin  550 mg Oral BID   Continuous Infusions:   LOS: 2 days    Time spent: 30 minutes    Jentri Aye Hoover Brunette, DO [facility] Pager [phone]  If 7PM-7AM, please contact night-coverage [email or web address] Password [email or web address] 07/14/2019, 4:00 PM

## 2019-07-14 NOTE — Progress Notes (Signed)
Subjective: When I entered the room the patient was standing at the end of the bed, blocked by the bedside table half-nude. There was soft stool in the bedside chair and the patient appeared disoriented and had the phone cord tangled up in her legs. CNA assisted me getting her back in the bed after she was cleaned up. Appears disoriented to month/year, although she was able to answer after delayed processing. Has mid-abdominal discomfort but no N/V. No other GI complaints.  Objective: Vital signs in last 24 hours: Temp:  [97.7 F (36.5 C)-98 F (36.7 C)] 98 F (36.7 C) (11/06 0543) Pulse Rate:  [92] 92 (11/06 0543) Resp:  [20] 20 (11/06 0543) BP: (106-111)/(61-65) 106/65 (11/06 0543) SpO2:  [90 %-96 %] 90 % (11/06 0543) Last BM Date: 07/13/19 General:   Alert and oriented, pleasant Head:  Normocephalic and atraumatic. Eyes:  No icterus, sclera clear. Conjuctiva pink.  Heart:  S1, S2 present, no murmurs noted.  Lungs: Clear to auscultation bilaterally, without wheezing, rales, or rhonchi.  Abdomen:  Bowel sounds present, mildly distended but soft. Area of firmness mid-abdominal area. No HSM or hernias noted. No rebound or guarding. No masses appreciated  Msk:  Symmetrical without gross deformities. Extremities:  Without clubbing or edema. Neurologic:  Alert and  oriented x2-3 Skin:  Area of excoriation and irritation upper mid-gluteal fold without bleeding.  Psych:  Alert and cooperative. Normal mood and affect.  Intake/Output from previous day: 11/05 0701 - 11/06 0700 In: 480 [P.O.:480] Out: 200 [Urine:200] Intake/Output this shift: No intake/output data recorded.  Lab Results: Recent Labs    07/12/19 0147 07/13/19 0520 07/14/19 0704  WBC 8.5 8.2 9.6  HGB 11.6* 12.3 12.1  HCT 36.8 39.4 38.4  PLT 140* 134* 139*   BMET Recent Labs    07/12/19 0147 07/13/19 0520 07/14/19 0704  NA 129* 133* 133*  K 4.2 4.2 4.6  CL 98 102 102  CO2 21* 21* 21*  GLUCOSE 308* 263*  197*  BUN 72* 73* 76*  CREATININE 2.49* 2.59* 2.61*  CALCIUM 8.1* 8.3* 8.5*   LFT Recent Labs    07/12/19 0147 07/13/19 0520 07/14/19 0704  PROT 5.9* 5.5* 5.5*  ALBUMIN 2.7* 2.8* 2.7*  AST 29 26 37  ALT 19 17 19   ALKPHOS 114 94 91  BILITOT 0.6 0.6 0.9   PT/INR Recent Labs    07/12/19 0147  LABPROT 17.0*  INR 1.4*   Hepatitis Panel No results for input(s): HEPBSAG, HCVAB, HEPAIGM, HEPBIGM in the last 72 hours.   Studies/Results: No results found.  Assessment: 68 year old female with cirrhosis secondary to NASH complicated by esophageal varices status post multiple banding sessions for primary prophylaxis (last EGD in January 2020), hepatic encephalopathy and ascites who had required intermittent abdominal paracentesis since June 2020.  She has had a total of 6 paracenteses, none in the last 4 weeks.  Prior to this admission, patient's last tap on October 26 with removal of 7 L of fluid.    Presented to the ED for tense ascites and low O2 sats.  While in the ED she had 8 L of fluid removed by ED provider, noted to be purulent.  Cultures and Gram stain obtained but the cell count was not done initially.  Started on ceftriaxone.  Dr. Laural Golden located specimen in the lab, reports that specimen was milky purulent fluid suspicious for SBP versus chylous ascites.  Cell count was obtained and not consistent with SBP. LFTs today are normal.  Hepatic encephalopathy: Serum ammonia higher than her baseline, evidence of asterixis.  On lactulose with 3 BMs last 24 hours.  At least one bowel movement thus far today. Xifaxan added. Remains somewhat confused today. Ammonia mildly improved at 103.  Mild thrombocytopenia secondary to cirrhosis/splenomegaly: Hemoglobin normal.  AFP in June was normal.  Repeated yesterday and normal at 0.9.  Chronic renal disease. Creatinine stable.   Liver transplant candidacy: Not considered a transplant candidate due to comorbidities and clinical status,  previously discussed with local liver transplant center. MELD 25  SBP: Cultures negative x 2 days. Ascites triglyceride level 397, smear review pending. Doubt SBP given cultures and cell count normal.  Plan: 1. Continue lactulose and Xifaxan 2. Discontinue SBP antibiotics 3. Monitor encephalopathy status 4. Follow for any needed paracentesis but no need to tap today for repeat labs 5. Will follow-up as outpatient for liver care 6. Supportive measures   Thank you for allowing [place] to participate in the care of Penny Ganja, DNP, AGNP-C Adult & Gerontological Nurse Practitioner [facility]     LOS: 2 days    07/14/2019, 12:52 PM

## 2019-07-15 LAB — CBC
HCT: 38.8 % (ref 36.0–46.0)
Hemoglobin: 12 g/dL (ref 12.0–15.0)
MCH: 26.4 pg (ref 26.0–34.0)
MCHC: 30.9 g/dL (ref 30.0–36.0)
MCV: 85.5 fL (ref 80.0–100.0)
Platelets: 142 10*3/uL — ABNORMAL LOW (ref 150–400)
RBC: 4.54 MIL/uL (ref 3.87–5.11)
RDW: 23.1 % — ABNORMAL HIGH (ref 11.5–15.5)
WBC: 8.9 10*3/uL (ref 4.0–10.5)
nRBC: 0 % (ref 0.0–0.2)

## 2019-07-15 LAB — COMPREHENSIVE METABOLIC PANEL
ALT: 20 U/L (ref 0–44)
AST: 39 U/L (ref 15–41)
Albumin: 2.7 g/dL — ABNORMAL LOW (ref 3.5–5.0)
Alkaline Phosphatase: 100 U/L (ref 38–126)
Anion gap: 13 (ref 5–15)
BUN: 81 mg/dL — ABNORMAL HIGH (ref 8–23)
CO2: 21 mmol/L — ABNORMAL LOW (ref 22–32)
Calcium: 8.7 mg/dL — ABNORMAL LOW (ref 8.9–10.3)
Chloride: 103 mmol/L (ref 98–111)
Creatinine, Ser: 2.71 mg/dL — ABNORMAL HIGH (ref 0.44–1.00)
GFR calc Af Amer: 20 mL/min — ABNORMAL LOW (ref >60)
GFR calc non Af Amer: 17 mL/min — ABNORMAL LOW (ref >60)
Glucose, Bld: 203 mg/dL — ABNORMAL HIGH (ref 70–99)
Potassium: 4.3 mmol/L (ref 3.5–5.1)
Sodium: 137 mmol/L (ref 135–145)
Total Bilirubin: 0.9 mg/dL (ref 0.3–1.2)
Total Protein: 5.6 g/dL — ABNORMAL LOW (ref 6.5–8.1)

## 2019-07-15 LAB — BODY FLUID CULTURE: Culture: NO GROWTH

## 2019-07-15 LAB — GLUCOSE, CAPILLARY
Glucose-Capillary: 185 mg/dL — ABNORMAL HIGH (ref 70–99)
Glucose-Capillary: 212 mg/dL — ABNORMAL HIGH (ref 70–99)
Glucose-Capillary: 147 mg/dL — ABNORMAL HIGH (ref 70–99)
Glucose-Capillary: 196 mg/dL — ABNORMAL HIGH (ref 70–99)

## 2019-07-15 LAB — AMMONIA: Ammonia: 84 umol/L — ABNORMAL HIGH (ref 9–35)

## 2019-07-15 MED ORDER — NYSTATIN 100000 UNIT/GM EX POWD
Freq: Three times a day (TID) | CUTANEOUS | Status: DC
  Administered 2019-07-15 – 2019-07-16 (×5): via TOPICAL
  Filled 2019-07-15: qty 15

## 2019-07-15 MED ORDER — CAMPHOR-MENTHOL 0.5-0.5 % EX LOTN
TOPICAL_LOTION | CUTANEOUS | Status: DC | PRN
  Administered 2019-07-15: 21:00:00 via TOPICAL
  Filled 2019-07-15: qty 222

## 2019-07-15 NOTE — Progress Notes (Signed)
PROGRESS NOTE    Penny Martinez  UXL:[id number] DOB: 1951/08/14 DOA: 07/12/2019 PCP: [name], MD   Brief Narrative:  Per HPI: Penny Rygh Thompsonis a 68 y.o.femalewith medical history significantforgastroesophageal reflux disease,chronicdiastolic heart failure,NASH/cirrhosis, morbid obesity, dyslipidemia, hypertension, type 2 diabetes mellitus with nephropathy, chronic kidney disease stage III, hypothyroidism and a sleep apnea; who presented to the emergency departmentwith complaints of abdominal pain due to abdominal distention secondary to decompensated liver cirrhosis. She denies nausea, emesis, constipation, melena or hematochezia. No dysuria, frequency or hematuria. She denies fever, chills, rhinorrhea, sore throat, productive cough, wheezing or hemoptysis. She occasionally gets dyspneic. She denies chest pain, palpitations, dizziness, diaphoresis, PND, but sometimes gets orthopneic and develops lower extremity edema. No polyuria, polydipsia, polyphagia or blurred vision.  11/4:Patient was admitted for SBP and has undergone paracentesis of 8.1 L of fluid. She is feeling overall much better and does not appear to have any signs or symptoms of pneumonia with noted left lower lobe pulmonary infiltrate. She remains on Rocephin 2 g IV every 24 hours with fluid culture and further results pending. Consult to GI for further assistance and evaluation management. Patient states that she has not been taking diuretics recently. It appears, that these diuretics to include torsemide and Aldactone were discontinued on recent discharge at 10/27 due to AKI with some concern for hepatorenal syndrome. She is supposed to follow-up with her nephrologist Dr. Wynelle Link of [facility] soon.  11/5:Appreciate GI recommendations with follow-up on further fluid analysis noted. Continues on Rocephin. Feels weak today. She is noted to have some elevated ammonia levels and hepatic encephalopathy for  which she has been started on Xifaxan today in addition to her lactulose.  11/6: Appreciate GI recommendations with a ascites triglyceride level 397 with smear review pending.  Continue lactulose and Xifaxan as patient still has hepatic encephalopathy.  Will discharge back to [facility] once ready for discharge.  Follow-up a.m. ammonia levels.  11/7: Patient appears to be doing somewhat better this morning and ammonia levels are decreasing.  Her appetite is still quite poor.  She is noted to have some skin rashes and itching throughout for which I have ordered Sarna cream as well as nystatin powder.  Wound care has also been consulted for sacral wound.  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  Hepatic encephalopathy-persistent -Continue lactulose with Xifaxan added -Follow-up ammonia levels in a.m. -Improving overall levels noted  Hypothyroidism -Continue Synthroid  Type 2 diabetes with hyperglycemia-improving -Continue current insulin dosing -Poor appetite currently noted and will not go up on SSI for now  Mixed dyslipidemia -Continue Zetia daily  Hyponatremia secondary to cirrhosis-improved -Sodium levels are stable today -Recheck levels in a.m.  GERD -PPI  Mild  thrombocytopenia secondary to cirrhosis/splenomegaly-stable -Repeat CBC monitoring -AFP less than 0.9 -Poor candidate for liver transplant  CKD stage III -  We will need outpatient renal follow-up -Currently with stable creatinine  Skin rashes/sacral wound -Appreciate wound care consultation -Continue with nystatin and topical creams for itching   DVT prophylaxis:SCDs Code Status:Full Family Communication:Husband at bedside Disposition Plan: No further IV antibiotics at this time.  Appreciate GI recommendations.  Continue treat hepatic encephalopathy with plans to transfer back to SNF once stable.  Hopefully by a.m. if appetite is improved.  Start nystatin powder today.   Consultants:  GI  Procedures:  Paracentesis with 8.1 L fluid removed on 11/4  Antimicrobials:  Anti-infectives (From admission, onward)   Start     Dose/Rate Route Frequency Ordered Stop   07/12/19 1800  rifaximin (XIFAXAN) tablet 550 mg     550 mg Oral 2 times daily 07/12/19 1602     07/12/19 0415  cefTRIAXone (ROCEPHIN) 2 g in sodium chloride 0.9 % 100 mL IVPB  Status:  Discontinued     2 g 200 mL/hr over 30 Minutes Intravenous Every 24 hours 07/12/19 0404 07/14/19 1302       Subjective: Patient seen and evaluated today with no new acute complaints or concerns. No acute concerns or events noted overnight.  Appears to still be slightly lethargic with diminished appetite.  Objective: Vitals:   07/14/19 1635 07/14/19 2215 07/15/19 0419 07/15/19 1019  BP: (!) 105/52 125/61 124/64 (!) 99/47  Pulse: 92 93 97 96  Resp: 16 18 20    Temp: 98 F (36.7 C) 98.3 F (36.8 C) 98.8 F (37.1 C)   TempSrc: Oral     SpO2: 97% 93% 93% 93%  Weight:      Height:       No intake or output data in the 24 hours ending 07/15/19 1035 Filed Weights   07/12/19 0050 07/12/19 0552  Weight: 101.6 kg 97.1 kg    Examination:  General exam: Appears calm and comfortable  Respiratory system: Clear to  auscultation. Respiratory effort normal. Cardiovascular system: S1 & S2 heard, RRR. No JVD, murmurs, rubs, gallops or clicks. No pedal edema. Gastrointestinal system: Abdomen is nondistended, soft and nontender. No organomegaly or masses felt. Normal bowel sounds heard. Central nervous system: Alert and awake Extremities: Symmetric 5 x 5 power. Skin: Skin lesions and rashes throughout with picture of sacral ulcer in chart Psychiatry: Flat affect    Data Reviewed: I have personally reviewed following labs and imaging studies  CBC: Recent Labs  Lab 07/12/19 0147 07/13/19 0520 07/14/19 0704 07/15/19 0727  WBC 8.5 8.2 9.6 8.9  NEUTROABS 6.7  --   --   --   HGB 11.6* 12.3 12.1 12.0  HCT 36.8 39.4 38.4 38.8  MCV 84.6 84.5 85.3 85.5  PLT 140* 134* 139* 142*   Basic Metabolic Panel: Recent Labs  Lab 07/12/19 0147 07/13/19 0520 07/14/19 0704 07/15/19 0727  NA 129* 133* 133* 137  K 4.2 4.2 4.6 4.3  CL 98 102 102 103  CO2 21* 21* 21* 21*  GLUCOSE 308* 263* 197* 203*  BUN 72* 73* 76* 81*  CREATININE 2.49* 2.59* 2.61* 2.71*  CALCIUM 8.1* 8.3* 8.5* 8.7*   GFR: Estimated Creatinine Clearance: 21.2 mL/min (A) (by C-G formula based on SCr of 2.71 mg/dL (H)). Liver Function Tests: Recent Labs  Lab 07/12/19 0147 07/13/19 0520 07/14/19 0704 07/15/19 0727  AST 29 26 37 39  ALT 19 17 19 20   ALKPHOS 114 94 91 100  BILITOT 0.6 0.6 0.9 0.9  PROT 5.9* 5.5* 5.5* 5.6*  ALBUMIN 2.7* 2.8* 2.7* 2.7*  No results for input(s): LIPASE, AMYLASE in the last 168 hours. Recent Labs  Lab 07/12/19 0147 07/14/19 0704 07/15/19 0727  AMMONIA 116* 103* 84*   Coagulation Profile: Recent Labs  Lab 07/12/19 0147  INR 1.4*   Cardiac Enzymes: No results for input(s): CKTOTAL, CKMB, CKMBINDEX, TROPONINI in the last 168 hours. BNP (last 3 results) No results for input(s): PROBNP in the last 8760 hours. HbA1C: No results for input(s): HGBA1C in the last 72 hours. CBG: Recent Labs  Lab  07/14/19 0747 07/14/19 1157 07/14/19 1647 07/14/19 2215 07/15/19 0851  GLUCAP 174* 258* 226* 177* 185*   Lipid Profile: No results for input(s): CHOL, HDL, LDLCALC, TRIG, CHOLHDL, LDLDIRECT in the last 72 hours. Thyroid Function Tests: No results for input(s): TSH, T4TOTAL, FREET4, T3FREE, THYROIDAB in the last 72 hours. Anemia Panel: No results for input(s): VITAMINB12, FOLATE, FERRITIN, TIBC, IRON, RETICCTPCT in the last 72 hours. Sepsis Labs: No results for input(s): PROCALCITON, LATICACIDVEN in the last 168 hours.  Recent Results (from the past 240 hour(s))  Body fluid culture     Status: None   Collection Time: 07/12/19  3:22 AM   Specimen: Peritoneal Cavity; Peritoneal Fluid  Result Value Ref Range Status   Specimen Description   Final    PERITONEAL CAVITY Performed at [facility], [address], [place], [place] [address]    Special Requests   Final    NONE Performed at [facility], [address], [place], [place] [address]    Gram Stain   Final    FEW WBC PRESENT, PREDOMINANTLY PMN NO ORGANISMS SEEN    Culture   Final    NO GROWTH 3 DAYS Performed at [facility], [address], [place], [place] [address]    Report Status 07/15/2019 FINAL  Final  SARS CORONAVIRUS 2 (TAT 6-24 HRS) Nasopharyngeal Nasopharyngeal Swab     Status: None   Collection Time: 07/12/19  5:30 AM   Specimen: Nasopharyngeal Swab  Result Value Ref Range Status   SARS Coronavirus 2 NEGATIVE NEGATIVE Final    Comment: (NOTE) SARS-CoV-2 target nucleic acids are NOT DETECTED. The SARS-CoV-2 RNA is generally detectable in upper and lower respiratory specimens during the acute phase of infection. Negative results do not preclude SARS-CoV-2 infection, do not rule out co-infections with other pathogens, and should not be used as the sole basis for treatment or other patient management decisions. Negative results must be combined with clinical observations, patient history, and  epidemiological information. The expected result is Negative. Fact Sheet for Patients: HairSlick.no Fact Sheet for Healthcare Providers: quierodirigir.com This test is not yet approved or cleared by the [place] FDA and  has been authorized for detection and/or diagnosis of SARS-CoV-2 by FDA under an Emergency Use Authorization (EUA). This EUA will remain  in effect (meaning this test can be used) for the duration of the COVID-19 declaration under Section 56 4(b)(1) of the Act, 21 U.S.C. section 360bbb-3(b)(1), unless the authorization is terminated or revoked sooner. Performed at [facility], [address], [place], [place] [address]          Radiology Studies: No results found.      Scheduled Meds: . amitriptyline  25 mg Oral QHS  . aspirin EC  81 mg Oral Q breakfast  . donepezil  5 mg Oral QHS  . ezetimibe  10 mg Oral q morning - 10a  . ferrous sulfate  325 mg Oral Daily  . gabapentin  100 mg  Oral QHS  . insulin aspart  0-9 Units Subcutaneous TID WC  . insulin glargine  24 Units Subcutaneous BID  . lactulose  30 g Oral BID  . levothyroxine  100 mcg Oral QAC breakfast  . nystatin   Topical TID  . pantoprazole  40 mg Oral Daily  . rifaximin  550 mg Oral BID   Continuous Infusions:   LOS: 3 days    Time spent: 30 minutes    Halden Phegley Hoover Brunette, DO [facility] Pager [phone]  If 7PM-7AM, please contact night-coverage [email or web address] Password [email or web address] 07/15/2019, 10:35 AM

## 2019-07-16 LAB — COMPREHENSIVE METABOLIC PANEL
ALT: 23 U/L (ref 0–44)
AST: 45 U/L — ABNORMAL HIGH (ref 15–41)
Albumin: 2.8 g/dL — ABNORMAL LOW (ref 3.5–5.0)
Alkaline Phosphatase: 107 U/L (ref 38–126)
Anion gap: 14 (ref 5–15)
BUN: 77 mg/dL — ABNORMAL HIGH (ref 8–23)
CO2: 21 mmol/L — ABNORMAL LOW (ref 22–32)
Calcium: 8.7 mg/dL — ABNORMAL LOW (ref 8.9–10.3)
Chloride: 100 mmol/L (ref 98–111)
Creatinine, Ser: 2.05 mg/dL — ABNORMAL HIGH (ref 0.44–1.00)
GFR calc Af Amer: 28 mL/min — ABNORMAL LOW (ref >60)
GFR calc non Af Amer: 24 mL/min — ABNORMAL LOW (ref >60)
Glucose, Bld: 240 mg/dL — ABNORMAL HIGH (ref 70–99)
Potassium: 3.9 mmol/L (ref 3.5–5.1)
Sodium: 135 mmol/L (ref 135–145)
Total Bilirubin: 0.9 mg/dL (ref 0.3–1.2)
Total Protein: 5.9 g/dL — ABNORMAL LOW (ref 6.5–8.1)

## 2019-07-16 LAB — CBC
HCT: 41.6 % (ref 36.0–46.0)
Hemoglobin: 12.5 g/dL (ref 12.0–15.0)
MCH: 25.8 pg — ABNORMAL LOW (ref 26.0–34.0)
MCHC: 30 g/dL (ref 30.0–36.0)
MCV: 85.8 fL (ref 80.0–100.0)
Platelets: 138 10*3/uL — ABNORMAL LOW (ref 150–400)
RBC: 4.85 MIL/uL (ref 3.87–5.11)
RDW: 23.1 % — ABNORMAL HIGH (ref 11.5–15.5)
WBC: 6.8 10*3/uL (ref 4.0–10.5)
nRBC: 0 % (ref 0.0–0.2)

## 2019-07-16 LAB — GLUCOSE, CAPILLARY
Glucose-Capillary: 237 mg/dL — ABNORMAL HIGH (ref 70–99)
Glucose-Capillary: 301 mg/dL — ABNORMAL HIGH (ref 70–99)
Glucose-Capillary: 261 mg/dL — ABNORMAL HIGH (ref 70–99)
Glucose-Capillary: 233 mg/dL — ABNORMAL HIGH (ref 70–99)

## 2019-07-16 LAB — AMMONIA: Ammonia: 39 umol/L — ABNORMAL HIGH (ref 9–35)

## 2019-07-16 MED ORDER — NYSTATIN 100000 UNIT/GM EX POWD: Freq: Three times a day (TID) | CUTANEOUS | 0 refills | Status: DC

## 2019-07-16 MED ORDER — RIFAXIMIN 550 MG PO TABS: 550.0000 mg | ORAL_TABLET | Freq: Two times a day (BID) | ORAL | 3 refills | Status: AC

## 2019-07-16 MED ORDER — CAMPHOR-MENTHOL 0.5-0.5 % EX LOTN: TOPICAL_LOTION | CUTANEOUS | 0 refills | Status: DC | PRN

## 2019-07-16 NOTE — Discharge Summary (Addendum)
Physician Discharge Summary  Penny Martinez QTM:[id number] DOB: Dec 23, 1950 DOA: 07/12/2019  PCP: Vidal Schwalbe, MD  Admit date: 07/12/2019  Discharge date: 07/17/2019  Admitted From:SNF  Disposition: SNF  Recommendations for Outpatient Follow-up:  1. Follow up with PCP in 1-2 weeks 2. Follow-up with Dr. Gala Romney in 2-3 weeks which will be scheduled 3. Continue on lactulose and now Xifaxan 550 mg twice daily as prescribed 4. Continue prior home medications 5. Consider hospice referral in the near future  Home Health: None  Equipment/Devices: None  Discharge Condition: Stable  CODE STATUS: Full  Diet recommendation: Heart Healthy/carb modified  Brief/Interim Summary: Per HPI: Penny Nancarrow Thompsonis a 68 y.o.femalewith medical history significantforgastroesophageal reflux disease,chronicdiastolic heart failure,NASH/cirrhosis, morbid obesity, dyslipidemia, hypertension, type 2 diabetes mellitus with nephropathy, chronic kidney disease stage III, hypothyroidism and a sleep apnea; who presented to the emergency departmentwith complaints of abdominal pain due to abdominal distention secondary to decompensated liver cirrhosis. She denies nausea, emesis, constipation, melena or hematochezia. No dysuria, frequency or hematuria. She denies fever, chills, rhinorrhea, sore throat, productive cough, wheezing or hemoptysis. She occasionally gets dyspneic. She denies chest pain, palpitations, dizziness, diaphoresis, PND, but sometimes gets orthopneic and develops lower extremity edema. No polyuria, polydipsia, polyphagia or blurred vision.  11/4:Patient was admitted for SBP and has undergone paracentesis of 8.1 L of fluid. She is feeling overall much better and does not appear to have any signs or symptoms of pneumonia with noted left lower lobe pulmonary infiltrate. She remains on Rocephin 2 g IV every 24 hours with fluid culture and further results pending. Consult to GI for further  assistance and evaluation management. Patient states that she has not been taking diuretics recently. It appears, that these diuretics to include torsemide and Aldactone were discontinued on recent discharge at 10/27 due to AKI with some concern for hepatorenal syndrome. She is supposed to follow-up with her nephrologist Dr. Juleen China of [place] soon.  11/5:Appreciate GI recommendations with follow-up on further fluid analysis noted. Continues on Rocephin. Feels weak today. She is noted to have some elevated ammonia levels and hepatic encephalopathy for which she has been started on Xifaxan today in addition to her lactulose.  11/6:Appreciate GI recommendations with a ascites triglyceride level 397 with smear review pending. Continue lactulose and Xifaxan as patient still has hepatic encephalopathy. Will discharge back to [facility] for [facility]. Follow-up a.m. ammonia levels.  11/7: Patient appears to be doing somewhat better this morning and ammonia levels are decreasing.  Her appetite is still quite poor.  She is noted to have some skin rashes and itching throughout for which I have ordered Sarna cream as well as nystatin powder.  Wound care has also been consulted for sacral wound.  11/8: Patient doing quite well this morning with ammonia levels of 34 noted.  She is more awake and alert and is eating well.  She is reluctant to go back to her skilled nursing facility but knows that she must do so in order to rehabilitate and be safe for eventual transfer back to her home.  I have discussed the case with Dr. Gala Romney who states that there is no need for repeat paracentesis or other GI inpatient management and will follow up with her in the outpatient setting.  She will need to continue her lactulose and Xifaxan which has helped her hepatic encephalopathy.  No noted SBP after paracentesis.  She is otherwise stable for discharge.  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  Discharge Instructions  Discharge Instructions    Diet - low sodium heart healthy   Complete by: As directed    Increase activity slowly   Complete by: As directed      Allergies as of 07/16/2019      Reactions   Erythromycin Hives   Niacin Rash, Shortness Of Breath   Penicillins Shortness Of Breath, Other (See Comments)   Has patient had a PCN reaction causing immediate rash, facial/tongue/throat swelling, SOB or lightheadedness with hypotension: Yes Has patient had a PCN reaction causing severe rash involving mucus membranes or skin necrosis: No Has patient had a PCN reaction that required hospitalization: No Has patient had a PCN reaction occurring within the last 10 years: No If all of the above answers are "NO", then may proceed with Cephalosporin use. [place] with cephaloporins   Sulfa Antibiotics Hives   Ciprofloxacin Itching, Nausea And Vomiting   Stomach pain   Statins Other (See Comments)   Muscle Pain   Codeine Itching   Fluticasone Rash   Tramadol Rash      Medication List    TAKE these medications   amitriptyline 25 MG tablet Commonly known as: ELAVIL Take 25 mg by mouth at bedtime.   Aricept 5 MG tablet Generic drug: donepezil Take 5 mg by mouth at bedtime.   aspirin EC 81 MG tablet Take 1 tablet (81 mg total) by mouth daily with breakfast.   camphor-menthol lotion Commonly known as: SARNA Apply topically as needed for itching.   ezetimibe 10 MG tablet Commonly known as: ZETIA Take 10  mg by mouth every morning.   ferrous sulfate 325 (65 FE) MG tablet Take 1 tablet (325 mg total) by mouth daily.   gabapentin 100 MG capsule Commonly known as: NEURONTIN Take 1 capsule (100 mg total) by mouth at bedtime. What changed: when to take this   insulin aspart 100 UNIT/ML injection Commonly known as: NovoLOG insulin aspart (novoLOG) injection 0-9 Units 0-9 Units, Subcutaneous, 3 times daily with meals, CBG < 70: Implement Hypoglycemia Standing Orders and refer to Hypoglycemia Standing Orders sidebar report  CBG 70 - 120: 0 units CBG 121 - 150: 1 unit CBG 151 - 200: 2 units CBG 201 - 250: 3 units CBG 251 - 300: 5 units CBG 301 - 350: 7 units CBG 351 - 400: 9 units CBG > 400:   insulin glargine 100 UNIT/ML injection Commonly known as: LANTUS Inject 0.24 mLs (24 Units total) into the skin 2 (two) times daily.   lactulose 10 GM/15ML solution Commonly known as: CHRONULAC Take 45 mLs (30 g total) by mouth 2 (two) times daily.   lansoprazole 30 MG capsule Commonly known as: PREVACID Take 1 capsule (30 mg total) by mouth 2 (two) times daily before a meal.   levothyroxine 100 MCG tablet Commonly known as: SYNTHROID TAKE 1 TABLET BY MOUTH ONCE DAILY. What changed: when to take this   nystatin powder Commonly known as: MYCOSTATIN/NYSTOP Apply topically 3 (three) times daily.   ondansetron 4 MG tablet Commonly known as: ZOFRAN Take 1  tablet (4 mg total) by mouth 4 (four) times daily -  before meals and at bedtime.   oxyCODONE 5 MG immediate release tablet Commonly known as: Oxy IR/ROXICODONE Take 1 tablet (5 mg total) by mouth every 4 (four) hours as needed for moderate pain or severe pain.   rifaximin 550 MG Tabs tablet Commonly known as: XIFAXAN Take 1 tablet (550 mg total) by mouth 2 (two) times daily.   Victoza 18 MG/3ML Sopn Generic drug: liraglutide INJECT 1.8MG SUBCUTANEOUSLY ONCE DAILY. What changed: See the new instructions.      Follow-up Information     Vidal Schwalbe, MD Follow up in 1 week(s).   Specialty: Family Medicine Contact information: [address] [place] [address] [phone]        Daneil Dolin, MD Follow up in 2 week(s).   Specialty: Gastroenterology Contact information: [place] [address] [phone]          Allergies  Allergen Reactions  . Erythromycin Hives  . Niacin Rash and Shortness Of Breath  . Penicillins Shortness Of Breath and Other (See Comments)    Has patient had a PCN reaction causing immediate rash, facial/tongue/throat swelling, SOB or lightheadedness with hypotension: Yes Has patient had a PCN reaction causing severe rash involving mucus membranes or skin necrosis: No Has patient had a PCN reaction that required hospitalization: No Has patient had a PCN reaction occurring within the last 10 years: No If all of the above answers are "NO", then may proceed with Cephalosporin use. [place] with cephaloporins  . Sulfa Antibiotics Hives  . Ciprofloxacin Itching and Nausea And Vomiting    Stomach pain  . Statins Other (See Comments)    Muscle Pain  . Codeine Itching  . Fluticasone Rash  . Tramadol Rash    Consultations:  GI   Procedures/Studies: US Renal  Result Date: 07/03/2019 CLINICAL DATA:  Acute on chronic renal failure EXAM: RENAL / URINARY TRACT ULTRASOUND COMPLETE COMPARISON:  08/11/2018 FINDINGS: Right Kidney: Renal measurements: 12.1 x 4.2 x 6.3 cm = volume: 166 mL . Echogenicity within normal limits. No mass or hydronephrosis visualized. Left Kidney: Renal measurements: 9.9 x 4.6 x 4.2 cm = volume: 100 mL. Examination is somewhat limited secondary to poor acoustic windows. Echogenicity appears within normal limits. No mass or hydronephrosis visualized. Bladder: Appears normal for degree of bladder distention. Other: Small volume free fluid within the upper abdomen. IMPRESSION: 1. Negative for obstructive uropathy. 2. Small volume residual free fluid  within the upper abdomen. Electronically Signed   By: Davina Poke M.D.   On: 07/03/2019 12:44   US Paracentesis  Result Date: 07/03/2019 INDICATION: Ascites EXAM: ULTRASOUND GUIDED  PARACENTESIS MEDICATIONS: None. COMPLICATIONS: None immediate. PROCEDURE: Informed written consent was obtained from the patient after a discussion of the risks, benefits and alternatives to treatment. A timeout was performed prior to the initiation of the procedure. Initial ultrasound scanning demonstrates a large amount of ascites within the right lower abdominal quadrant. The right lower abdomen was prepped and draped in the usual sterile fashion. 1% lidocaine was used for local anesthesia. Following this, a 19 gauge, 7-cm, Yueh catheter was introduced. An ultrasound image was saved for documentation purposes. The paracentesis was performed. The catheter was removed and a dressing was applied. The patient tolerated the procedure well without immediate post procedural complication. FINDINGS: A total of approximately 7 liters of serosanguineous fluid was removed. IMPRESSION: Successful ultrasound-guided paracentesis yielding 7 liters of peritoneal fluid. Electronically Signed  By: Rolm Baptise M.D.   On: 07/03/2019 12:32   US Paracentesis  Result Date: 06/21/2019 INDICATION: Ascites EXAM: ULTRASOUND GUIDED  PARACENTESIS MEDICATIONS: None. COMPLICATIONS: None immediate. PROCEDURE: Informed written consent was obtained from the patient after a discussion of the risks, benefits and alternatives to treatment. A timeout was performed prior to the initiation of the procedure. Initial ultrasound scanning demonstrates a large amount of ascites within the right lower abdominal quadrant. The right lower abdomen was prepped and draped in the usual sterile fashion. 1% lidocaine was used for local anesthesia. Following this, a 19 gauge, 7-cm, Yueh catheter was introduced. An ultrasound image was saved for documentation purposes. The  paracentesis was performed. The catheter was removed and a dressing was applied. The patient tolerated the procedure well without immediate post procedural complication. Patient received post-procedure intravenous albumin; see nursing notes for details. FINDINGS: A total of approximately 7.2 liters of clear yellow fluid was removed. Samples were sent to the laboratory as requested by the clinical team. IMPRESSION: Successful ultrasound-guided paracentesis yielding 7.2 liters of peritoneal fluid. Electronically Signed   By: Rolm Baptise M.D.   On: 06/21/2019 15:07   Dg Chest Port 1 View  Result Date: 07/12/2019 CLINICAL DATA:  Shortness of breath EXAM: PORTABLE CHEST 1 VIEW COMPARISON:  06/13/2019 FINDINGS: Cardiomegaly. Left pleural effusion with left lower lobe atelectasis or infiltrate. No confluent opacity on the right. No edema. No acute bony abnormality. IMPRESSION: Left pleural effusion with left lower lobe atelectasis or infiltrate/pneumonia. Electronically Signed   By: Rolm Baptise M.D.   On: 07/12/2019 01:53     Discharge Exam: Vitals:   07/15/19 2141 07/16/19 0654  BP: 100/88 (!) 110/50  Pulse: 87 90  Resp: 20 18  Temp: 98.1 F (36.7 C) 98.4 F (36.9 C)  SpO2: 99% 99%   Vitals:   07/15/19 1019 07/15/19 1506 07/15/19 2141 07/16/19 0654  BP: (!) 99/47 (!) 98/40 100/88 (!) 110/50  Pulse: 96 91 87 90  Resp:  20 20 18   Temp:  98.4 F (36.9 C) 98.1 F (36.7 C) 98.4 F (36.9 C)  TempSrc:  Oral    SpO2: 93% 100% 99% 99%  Weight:      Height:        General: Pt is alert, awake, not in acute distress Cardiovascular: RRR, S1/S2 +, no rubs, no gallops Respiratory: CTA bilaterally, no wheezing, no rhonchi Abdominal: Soft, NT, minimally distended, bowel sounds + Extremities: no edema, no cyanosis    The results of significant diagnostics from this hospitalization (including imaging, microbiology, ancillary and laboratory) are listed below for reference.      Microbiology: Recent Results (from the past 240 hour(s))  Body fluid culture     Status: None   Collection Time: 07/12/19  3:22 AM   Specimen: Peritoneal Cavity; Peritoneal Fluid  Result Value Ref Range Status   Specimen Description   Final    PERITONEAL CAVITY Performed at [facility], [address] [place], [place] [address]    Special Requests   Final    NONE Performed at [facility], [address] [place], [place] [address]    Gram Stain   Final    FEW WBC PRESENT, PREDOMINANTLY PMN NO ORGANISMS SEEN    Culture   Final    NO GROWTH 3 DAYS Performed at [place] [address] [place], [place] [address]    Report Status 07/15/2019 FINAL  Final  SARS CORONAVIRUS 2 (TAT 6-24 HRS) Nasopharyngeal  Nasopharyngeal Swab     Status: None   Collection Time: 07/12/19  5:30 AM   Specimen: Nasopharyngeal Swab  Result Value Ref Range Status   SARS Coronavirus 2 NEGATIVE NEGATIVE Final    Comment: (NOTE) SARS-CoV-2 target nucleic acids are NOT DETECTED. The SARS-CoV-2 RNA is generally detectable in upper and lower respiratory specimens during the acute phase of infection. Negative results do not preclude SARS-CoV-2 infection, do not rule out co-infections with other pathogens, and should not be used as the sole basis for treatment or other patient management decisions. Negative results must be combined with clinical observations, patient history, and epidemiological information. The expected result is Negative. Fact Sheet for Patients: SugarRoll.be Fact Sheet for Healthcare Providers: https://www.woods-mathews.com/ This test is not yet approved or cleared by the [place] FDA and  has been authorized for detection and/or diagnosis of SARS-CoV-2 by FDA under an Emergency Use Authorization (EUA). This EUA will remain  in effect (meaning this test can be used) for the duration of the COVID-19 declaration under Section  56 4(b)(1) of the Act, 21 U.S.C. section 360bbb-3(b)(1), unless the authorization is terminated or revoked sooner. Performed at [facility], [place] [address] [place], [place] [address]      Labs: BNP (last 3 results) Recent Labs    06/13/19 1926 07/12/19 0147  BNP 34.0 37.0   Basic Metabolic Panel: Recent Labs  Lab 07/12/19 0147 07/13/19 0520 07/14/19 0704 07/15/19 0727 07/16/19 0702  NA 129* 133* 133* 137 135  K 4.2 4.2 4.6 4.3 3.9  CL 98 102 102 103 100  CO2 21* 21* 21* 21* 21*  GLUCOSE 308* 263* 197* 203* 240*  BUN 72* 73* 76* 81* 77*  CREATININE 2.49* 2.59* 2.61* 2.71* 2.05*  CALCIUM 8.1* 8.3* 8.5* 8.7* 8.7*   Liver Function Tests: Recent Labs  Lab 07/12/19 0147 07/13/19 0520 07/14/19 0704 07/15/19 0727 07/16/19 0702  AST 29 26 37 39 45*  ALT 19 17 19 20 23   ALKPHOS 114 94 91 100 107  BILITOT 0.6 0.6 0.9 0.9 0.9  PROT 5.9* 5.5* 5.5* 5.6* 5.9*  ALBUMIN 2.7* 2.8* 2.7* 2.7* 2.8*   No results for input(s): LIPASE, AMYLASE in the last 168 hours. Recent Labs  Lab 07/12/19 0147 07/14/19 0704 07/15/19 0727 07/16/19 0702  AMMONIA 116* 103* 84* 39*   CBC: Recent Labs  Lab 07/12/19 0147 07/13/19 0520 07/14/19 0704 07/15/19 0727 07/16/19 0702  WBC 8.5 8.2 9.6 8.9 6.8  NEUTROABS 6.7  --   --   --   --   HGB 11.6* 12.3 12.1 12.0 12.5  HCT 36.8 39.4 38.4 38.8 41.6  MCV 84.6 84.5 85.3 85.5 85.8  PLT 140* 134* 139* 142* 138*   Cardiac Enzymes: No results for input(s): CKTOTAL, CKMB, CKMBINDEX, TROPONINI in the last 168 hours. BNP: Invalid input(s): POCBNP CBG: Recent Labs  Lab 07/15/19 0851 07/15/19 1132 07/15/19 1630 07/15/19 2140 07/16/19 0738  GLUCAP 185* 212* 147* 196* 237*   D-Dimer No results for input(s): DDIMER in the last 72 hours. Hgb A1c No results for input(s): HGBA1C in the last 72 hours. Lipid Profile No results for input(s): CHOL, HDL, LDLCALC, TRIG, CHOLHDL, LDLDIRECT in the last 72 hours. Thyroid function  studies No results for input(s): TSH, T4TOTAL, T3FREE, THYROIDAB in the last 72 hours.  Invalid input(s): FREET3 Anemia work up No results for input(s): VITAMINB12, FOLATE, FERRITIN, TIBC, IRON, RETICCTPCT in the last 72 hours. Urinalysis    Component Value Date/Time  COLORURINE YELLOW 07/03/2019 1520   APPEARANCEUR HAZY (A) 07/03/2019 1520   LABSPEC 1.012 07/03/2019 1520   PHURINE 5.0 07/03/2019 1520   GLUCOSEU NEGATIVE 07/03/2019 1520   HGBUR SMALL (A) 07/03/2019 [place] 07/03/2019 [place] 07/03/2019 1520   PROTEINUR NEGATIVE 07/03/2019 1520   UROBILINOGEN 0.2 03/05/2014 2140   NITRITE NEGATIVE 07/03/2019 1520   LEUKOCYTESUR SMALL (A) 07/03/2019 1520   Sepsis Labs Invalid input(s): PROCALCITONIN,  WBC,  LACTICIDVEN Microbiology Recent Results (from the past 240 hour(s))  Body fluid culture     Status: None   Collection Time: 07/12/19  3:22 AM   Specimen: Peritoneal Cavity; Peritoneal Fluid  Result Value Ref Range Status   Specimen Description   Final    PERITONEAL CAVITY Performed at [facility], [address], [place], [place] [address]    Special Requests   Final    NONE Performed at [facility], [address] [place], [place] [address]    Gram Stain   Final    FEW WBC PRESENT, PREDOMINANTLY PMN NO ORGANISMS SEEN    Culture   Final    NO GROWTH 3 DAYS Performed at [facility], [place] [address] [place], [place] [address]    Report Status 07/15/2019 FINAL  Final  SARS CORONAVIRUS 2 (TAT 6-24 HRS) Nasopharyngeal Nasopharyngeal Swab     Status: None   Collection Time: 07/12/19  5:30 AM   Specimen: Nasopharyngeal Swab  Result Value Ref Range Status   SARS Coronavirus 2 NEGATIVE NEGATIVE Final    Comment: (NOTE) SARS-CoV-2 target nucleic acids are NOT DETECTED. The SARS-CoV-2 RNA is generally detectable in upper and lower respiratory specimens during the acute phase of infection. Negative results do not preclude  SARS-CoV-2 infection, do not rule out co-infections with other pathogens, and should not be used as the sole basis for treatment or other patient management decisions. Negative results must be combined with clinical observations, patient history, and epidemiological information. The expected result is Negative. Fact Sheet for Patients: SugarRoll.be Fact Sheet for Healthcare Providers: https://www.woods-mathews.com/ This test is not yet approved or cleared by the [place] FDA and  has been authorized for detection and/or diagnosis of SARS-CoV-2 by FDA under an Emergency Use Authorization (EUA). This EUA will remain  in effect (meaning this test can be used) for the duration of the COVID-19 declaration under Section 56 4(b)(1) of the Act, 21 U.S.C. section 360bbb-3(b)(1), unless the authorization is terminated or revoked sooner. Performed at [facility], [place] [address] [place], [place] [address]      Time coordinating discharge: 35 minutes  SIGNED:   Rodena Goldmann, DO [facility] 07/16/2019, 10:02 AM  If 7PM-7AM, please contact night-coverage [email or web address]

## 2019-07-16 NOTE — Consult Note (Addendum)
[facility] Nurse wound consult note.  Reason for Consult:Right buttock at gluteal cleft deep tissue pressure injury (DTPI) Wound type:Pressure Pressure Injury POA: Yes Measurement: to be obtained by Bedside RN today and documented on Flow Sheet Wound FMB:WGYK purple.maroon discoloration of the skin, slight peel at center beginning.  This is a classic presentation of a DTPI and worsening of this lesion is expected. Drainage (amount, consistency, odor) None Periwound:intact Dressing procedure/placement/frequency: I will implement a four times daily regimen of cleansing and application of a moisture barrier cream plus antifungal plus hydrocortisone cream in a 1:1:1 compounded product (Gerhart's Butt Cream). Guidance for patient positioning off of the area while in bed is provided for nursing. A pressure redistribution chair cushion is ordered for her use when OOB to chair that she can take with her for continuation of therapy upon discharge.  [place] nursing team will not follow, but will remain available to this patient, the nursing and medical teams.  Please re-consult if needed. Thanks, Maudie Flakes, MSN, RN, [place], Arther Abbott  Pager# [phone]   Addendum:  Bedside RN has provided measurements of 1.5cm x 2cm for the above lesion/DTPI.  Her assistance is appreciated.  [place] nursing team will not follow, but will remain available to this patient, the nursing and medical teams.  Please re-consult if needed. Thanks, Maudie Flakes, MSN, RN, [place], Arther Abbott  Pager# [phone]

## 2019-07-16 NOTE — Plan of Care (Signed)
  Problem: Education: Goal: Knowledge of General Education information will improve Description Including pain rating scale, medication(s)/side effects and non-pharmacologic comfort measures Outcome: Progressing   

## 2019-07-16 NOTE — TOC Transition Note (Addendum)
Transition of Care [facility]) - CM/SW Discharge Note   Patient Details  Name: Penny Martinez MRN: [id number] Date of Birth: 18-Sep-1950  [facility] CM/SW Contact:  Latanya Maudlin, RN Phone Number: 07/16/2019, 1:13 PM   Clinical Narrative:  Patient has been medically cleared to return to [facility] [place]. RNCM has called Admission coordinator x2. Also, called main line x2 and no one answered the phone. Patient resided at [facility] prior to admission and was planning to return. TOC team will continue calling to attempt and get a bed assignment.    Update 25- Spoke with facility. Patient can return to room 203 today. Will need EMD for transport. Will provide bedside RN with report number      Barriers to Discharge: Continued Medical Work up   Patient Goals and CMS Choice        Discharge Placement                       Discharge Plan and Services In-house Referral: Clinical Social Work                                   Social Determinants of Health (SDOH) Interventions     Readmission Risk Interventions Readmission Risk Prevention Plan 07/12/2019 06/28/2019 06/15/2019  Transportation Screening Complete Complete -  PCP or Specialist Appt within 3-5 Days - - Complete  HRI or [place] - Complete Complete  Social Work Consult for [place] Planning/Counseling - Complete -  [place] - Not Applicable -  Medication Review Press photographer) Complete Complete -  Some recent data might be hidden

## 2019-07-17 ENCOUNTER — Telehealth: Payer: Self-pay | Admitting: *Deleted

## 2019-07-17 LAB — GLUCOSE, CAPILLARY: Glucose-Capillary: 265 mg/dL — ABNORMAL HIGH (ref 70–99)

## 2019-07-17 NOTE — Progress Notes (Signed)
55- received report from dayshift RN stating report had been called to the [facility], all transport paperwork was at the desk, and EMS would arrive around 2030 to pick pt up.  2100- EMS arrived to transport patient, requested RN call [facility] to ensure they would receive pt at this hour. Per EMS, they have transported patients to nursing homes at night, were refused at the door, and had to bring the patient back to [facility]. RN called [facility] multiple times, EMS called their Dispatcher to see if there was another number for them, no one from the [facility] ever picked up the phone. Notified charge nurse, [facility], and MD. Per MD it is okay for the patient to be transported in the morning.

## 2019-07-17 NOTE — Telephone Encounter (Signed)
Pt's daughter Santiago Glad called to let us know that pt's medication Rifaximin that she was supposed to pick up after discharge from the hospital was too expensive.  Can we advise please?  [phone] or [phone]

## 2019-07-18 NOTE — Telephone Encounter (Signed)
Spoke with the Pharmacy. Pt's medication wasn't covered under insurance. I asked them to please send PA information to our office. PA info was received. PA was submitted through [email or web address]. waiting on an approval or denial.

## 2019-07-18 NOTE — Telephone Encounter (Signed)
Spoke with pt's daughter 07/17/2019 at 5:00 PM. Pt was given Xifaxan at [facility] while at the ED. Pt's medication is too expensive for pt to pay for. Will call pt's pharmacy. I haven't received any info from the pharmacy. Not sure if medication is covered under pt's insurance or not covered.

## 2019-07-19 ENCOUNTER — Encounter (HOSPITAL_COMMUNITY): Payer: Self-pay

## 2019-07-19 ENCOUNTER — Ambulatory Visit (HOSPITAL_COMMUNITY): Admission: RE | Admit: 2019-07-19 | Payer: Medicare Other | Source: Ambulatory Visit

## 2019-07-19 NOTE — Telephone Encounter (Signed)
Spoke with pt's daughter this morning 07/19/2019. Pt's Xifaxan has been approved. Approval letter will be scanned in pt's chart when received.

## 2019-07-20 ENCOUNTER — Telehealth: Payer: Self-pay

## 2019-07-20 NOTE — Telephone Encounter (Signed)
Received a VM from [place]. Returned call, pt has been discharged from [facility] has been called in. Pt needs to have another paracentesis done to get fluid off of pt. They would like to know if a tube could be placed so pt can receive paracentesis at home for comfort. Please advise in the absence of AB.

## 2019-07-20 NOTE — Telephone Encounter (Addendum)
Can we get a copy of DC summary from [place]?  She was just discharged from [facility] on 07/16/2019 but sounds like she ended back in hospital fairly quickly.  I'm not sure how long it would take to get a PleurX drain. She will have to be seen by interventional radiology first. We may need to go with standard tap while we wait if she is having significant abdominal distention which causes difficulty breathing, abd discomfort.   Let's find out more info from the Hospice nurse.  I feel like patient needs to be told that PleurX can causes life threatening infection as access point for bacteria to take hold and she needs to understand that risk. I can talk with her if needed.

## 2019-07-21 NOTE — Telephone Encounter (Signed)
Reviewed records from recent hospitalization at [facility].  Patient presented from the [facility] combative, confused, refusing medications.  On admission her ammonia was 38.  It was felt like her mental status changes were multifactorial in the setting of dementia and dehydration as well.  Family has elected to take patient home with hospice support.  At time of discharge her BUN was 81, creatinine 2.27, total bilirubin 0.7, AST 24, ALT 19, alk phos 106, albumin 2.3, TSH 4.25, white blood cell count 4630, hemoglobin 10.5, platelets 126,000.  I will discuss further with Roseanne Kaufman who is very familiar with the patient.  Clearly patient has had significant decline that she was seen in the office back in June 2020.  She was hospitalized at [facility] last week for ascites and low O2 saturations.  ED provider removed 8 L of fluid felt to be purulent.  She was started on ceftriaxone.  Cell count was not initially done but later requested by Dr. Melony Overly the following day.  Cell count was not consistent with SBP.  Her cultures and Gram stain are unremarkable.  It is noted that the patient has had a total of 6 paracenteses, the last 3 were done in the past 4 weeks.  First 1 done back in June.  Hospice and daughter requesting PleurX drain. Will discuss with AB. As noted. Hospice to further explain risks with infection, hepatorenal syndrome.

## 2019-07-21 NOTE — Telephone Encounter (Signed)
requested

## 2019-07-21 NOTE — Telephone Encounter (Signed)
Penny Martinez can you request records from [facility]

## 2019-07-21 NOTE — Telephone Encounter (Signed)
Spoke with Maudie Mercury [phone], she is aware of the risk and will disk it with the pt. Pt told Maudie Mercury the last time she had a para, she had too much fluid drawn off and was in some pain. Per Maudie Mercury pt has some fluid building back up but wasn't uncomfortable or having shortness of breath. Maudie Mercury would prefer that they manage the para's at home since so pt could get use to once person doing it. Maudie Mercury also reports pt has some dementia as well that is being seen. Kim didn't think the pt needed a para today but wants our office to arrange the at home paras.

## 2019-07-21 NOTE — Telephone Encounter (Signed)
Please communicate with Hospice nurse today about plan and find out if they want to go ahead and schedule regular tap while deciding about PleurX drain.

## 2019-07-24 ENCOUNTER — Telehealth: Payer: Self-pay | Admitting: Internal Medicine

## 2019-07-24 NOTE — Telephone Encounter (Signed)
Returned pts daughter's call. She would like to go a head and get the fluid drawn off of her mom. She is aware that home health wants pt to have paras at home only. Pt's daughter said pt isn't on diuretics and her abdomen is tight and swollen. She also would like clarity on the Lactulose dosage. Pt was given different instructions from the hospital discharge. Please advise on correct dosage. AB please see previous note. LSL is going to discuss with you.

## 2019-07-24 NOTE — Telephone Encounter (Signed)
Until we are able to speak to hospice, if she is in need of a para, we can order this per radiology protocol. No more than 4 liters removed. Obtain cell count and cytology.    For lactulose: take 45 mls twice a day. If having diarrhea, needs to back down on dosage. Goal is 3 soft BMs per day. They can titrate this down if needed as long as achieving BMs.   Diuretic therapy: appears at time of discharge from [facility] in [place], her creatinine was 2.27, BUN 81. I would have them reach out to her nephrologist. She has not been tolerating diuretic therapy moist recently.

## 2019-07-24 NOTE — Telephone Encounter (Addendum)
Discussed with AB. We both agree that the discussion regarding PleurX needs to be discussed with patient, or family and then we can make arrangements if all in agreement. Please find out who is next of kin or POA.   Will also forward to RMR to update on change in patient's status to Hospice and their request for PleurX.  I tried to call, Janice Norrie with [facility], had to leave message for return call. Need to either find out a good time for me to talk with her about patient tomorrow or maybe if she calls while AB around she can take call.

## 2019-07-24 NOTE — Telephone Encounter (Signed)
Pt's daughter, Santiago Glad, called to say the patient needs fluid drawn off and also has questions about the patient's lactulose prescription. Please call either [phone] or [phone]

## 2019-07-24 NOTE — Telephone Encounter (Signed)
Noted  

## 2019-07-24 NOTE — Telephone Encounter (Signed)
Lmom, waiting on a return call from pt's daughter.

## 2019-07-24 NOTE — Telephone Encounter (Signed)
Checked our file and patient does not have standing order. fowarding to AM to get more detail

## 2019-07-25 NOTE — Telephone Encounter (Signed)
Called pt's daughter again and let her know the recommendations of Lactulose. She understands how pt should be taking this medication.

## 2019-07-25 NOTE — Telephone Encounter (Signed)
Lengthy communications noted.  Life expectancy is short.  I would look hard at a scheduled appointment for paracentesis q. Weekly.  I am not sure where the popularity of a PleurX has come from recently.  Would focus on support and palliative paracentesis weekly.

## 2019-07-26 ENCOUNTER — Other Ambulatory Visit: Payer: Self-pay | Admitting: *Deleted

## 2019-07-26 DIAGNOSIS — R188 Other ascites: Secondary | ICD-10-CM

## 2019-07-26 NOTE — Telephone Encounter (Signed)
Standing orders faxed

## 2019-07-26 NOTE — Telephone Encounter (Signed)
Noted. FYI LSL, pt's daughter was notified at 8:00 AM this morning. L/m for hospice nurse to return call. Documentation is in other phone note.

## 2019-07-26 NOTE — Telephone Encounter (Signed)
Spoke with Santiago Glad pts daughter and she is aware that a standing order for paras will be placed today. Still waiting on hospice to call back.

## 2019-07-26 NOTE — Telephone Encounter (Signed)
Noted. Please see separate phone documentation regarding serial paras.

## 2019-07-26 NOTE — Telephone Encounter (Signed)
When scheduling paras, please contact pts daughter Santiago Glad at  [phone] or [phone]

## 2019-07-26 NOTE — Telephone Encounter (Signed)
Called [facility] and PARA scheduled for 11/20 at 11:00am, arrival 10:45am. Called daughter Santiago Glad and she was not at home. Spoke with spouse Gwyndolyn Saxon and he is aware of appt details.    AB please advise on standing orders for para? Labs? Albumin? Is she limited to amount each time? Thanks

## 2019-07-26 NOTE — Telephone Encounter (Addendum)
Penny Martinez, please see RMR input. He is advising for paracenteses as a standing order as opposed to PleurX drain.   Please let patient's daughter and Hospice nurse know of recommendations.   FYI AB.

## 2019-07-26 NOTE — Telephone Encounter (Signed)
Noted  

## 2019-07-26 NOTE — Telephone Encounter (Signed)
For now, max of 5 liters. I am concerned that with her status, amounts greater than 5 liters could result in intravascular volume depletion. Recommend 25 grams IV albumin at 4 liters. Only cell count right now.   Please have hospice call me. We need to be on same page for goals of care. I am unsure if she can actually remain in hospice care IF she is still undergoing serial paras.

## 2019-07-26 NOTE — Telephone Encounter (Signed)
RGA, I've spoken with AB, please see the other phone note if needed. Pt can have a standing order for weekly paras. Please arrange.

## 2019-07-28 ENCOUNTER — Ambulatory Visit (HOSPITAL_COMMUNITY)
Admission: RE | Admit: 2019-07-28 | Discharge: 2019-07-28 | Disposition: A | Discharge status: Home / Self Care | Payer: Medicare Other | Source: Ambulatory Visit | Attending: Internal Medicine | Admitting: Internal Medicine

## 2019-07-28 ENCOUNTER — Other Ambulatory Visit: Payer: Self-pay

## 2019-07-28 DIAGNOSIS — R188 Other ascites: Secondary | ICD-10-CM | POA: Diagnosis present

## 2019-07-28 LAB — BODY FLUID CELL COUNT WITH DIFFERENTIAL
Eos, Fluid: 0 %
Lymphs, Fluid: 65 %
Monocyte-Macrophage-Serous Fluid: 34 % — ABNORMAL LOW (ref 50–90)
Neutrophil Count, Fluid: 1 % (ref 0–25)
Other Cells, Fluid: 1 %
Total Nucleated Cell Count, Fluid: 316 cu mm (ref 0–1000)

## 2019-07-28 NOTE — Progress Notes (Signed)
Paracentesis complete no signs of distress.

## 2019-07-28 NOTE — Procedures (Signed)
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

## 2019-07-31 ENCOUNTER — Other Ambulatory Visit: Payer: Self-pay | Admitting: Gastroenterology

## 2019-07-31 LAB — CYTOLOGY - NON PAP

## 2019-07-31 NOTE — Progress Notes (Signed)
Negative SBP.

## 2019-08-01 ENCOUNTER — Ambulatory Visit (INDEPENDENT_AMBULATORY_CARE_PROVIDER_SITE_OTHER): Admitting: Internal Medicine

## 2019-08-01 ENCOUNTER — Encounter: Payer: Self-pay | Admitting: Internal Medicine

## 2019-08-01 ENCOUNTER — Telehealth: Payer: Self-pay | Admitting: Internal Medicine

## 2019-08-01 ENCOUNTER — Other Ambulatory Visit: Payer: Self-pay

## 2019-08-01 VITALS — BP 148/81 | HR 97 | Temp 97.2°F | Ht 62.0 in | Wt 213.0 lb

## 2019-08-01 DIAGNOSIS — R601 Generalized edema: Secondary | ICD-10-CM | POA: Diagnosis not present

## 2019-08-01 DIAGNOSIS — K7682 Hepatic encephalopathy: Secondary | ICD-10-CM

## 2019-08-01 DIAGNOSIS — K7290 Hepatic failure, unspecified without coma: Secondary | ICD-10-CM | POA: Diagnosis not present

## 2019-08-01 DIAGNOSIS — K7469 Other cirrhosis of liver: Secondary | ICD-10-CM | POA: Diagnosis not present

## 2019-08-01 NOTE — Patient Instructions (Addendum)
Information on 2 gram sodium diet (limit sodium to 2 grams daily)  Continue Xifaxaxan and lactulose daily - goal  - 3 semi-formed stool daily  Continue lansoprazole daily  Weight at home today; then weigh periodically; if weight increases by more than 5 pounds let me know  May use acetaminophen up to 2 grams daily as needed for pain (avoid NSAID medications)  Follow up with Dr. Bartolo Darter as directed  Office  Visit here in 1 month

## 2019-08-01 NOTE — Progress Notes (Signed)
Primary Care Physician:  Vidal Schwalbe, MD Primary Gastroenterologist:  Dr. Gala Romney  Pre-Procedure History & Physical: HPI:  Penny Martinez is a 68 y.o. 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  Recently hospitalized in [place] and [place] with anasarca and hepatic encephalopathy, respectively.  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  Had been taken off of all of her diuretics in [place].  Nephrologist has reinstituted diuretic therapy the way of Lasix 80 mg daily.  She is now back at home where her family who are  caring for her.  She has a pressure sore for which she is followed by Dr. Bartolo Darter  She continues on Xifaxan and lactulose.  Titrating lactulose to 3 semiformed stools daily; lansoprazole 30 mg daily controlling reflux symptoms very well.  Not particularly versed on 2 g sodium limited diet.  Weight is 213 today -  she did weigh 225 here back in August.  She has the capability to weigh her self at home.  Has pain in her right arm and other places and patient and family wondering what is safe for her to take.  Indwelling peritoneal drainage tube has been brought up in discussion previously.  With associated morbidity, it is not recommended.   Past Medical History:  Diagnosis Date  . Acid reflux   . Anemia   . Arthritis   . C. difficile colitis June/July 2015  . Cancer ([place])    skin cancer  . CHF (congestive heart failure) ([place])   . Cirrhosis ([place])    likely due to NASH. Negative viral markers 2015  . Complication of anesthesia   . COPD (chronic obstructive pulmonary disease) ([place])   . Diabetes mellitus without complication ([place])   . GERD (gastroesophageal reflux disease)   . History of kidney stones   . Hypercholesteremia   . Hypertension   . Hypothyroidism   . IBS (irritable bowel syndrome)   . Kidney disease   .  Liver disease   . Neuropathy   . PONV (postoperative nausea and vomiting)   . Sleep apnea   . Thyroid disease     Past Surgical History:  Procedure Laterality Date  . ABDOMINAL HYSTERECTOMY    . APPENDECTOMY    . CAROTID ARTERY - SUBCLAVIAN ARTERY BYPASS GRAFT Right   . CARPAL TUNNEL RELEASE Right   . CARPAL TUNNEL RELEASE Left 12/03/2016   Procedure: CARPAL TUNNEL RELEASE;  Surgeon: Carole Civil, MD;  Location: [facility] ORS;  Service: Orthopedics;  Laterality: Left;  . CATARACT EXTRACTION Bilateral   . CHOLECYSTECTOMY    . COLONOSCOPY N/A 02/19/2014   Dr. Gala Romney: rectal varices, rectal polyp overlying a varix non-manipulated  . ESOPHAGEAL BANDING N/A 08/15/2018   Procedure: ESOPHAGEAL BANDING;  Surgeon: Daneil Dolin, MD;  Location: [facility] ENDO SUITE;  Service: Endoscopy;  Laterality: N/A;  . ESOPHAGEAL BANDING N/A 09/15/2018   Procedure: ESOPHAGEAL BANDING;  Surgeon: Daneil Dolin, MD;  Location: [facility] END[facility] SUITE;  Service: Endoscopy;  Laterality: N/A;  . ESOPHAGOGASTRODUODENOSCOPY N/A 02/19/2014   Dr. Gala Romney: 4 columns of grade 2 esophageal varices, multiple gastric polyps with largest biopsied and hyperplastic. Negative H.pylori  . ESOPHAGOGASTRODUODENOSCOPY N/A 04/13/2018   Dr. Gala Romney: Grade 3 esophageal varices, no esophagitis, portal gastropathy, multiple hyperplastic appearing polyps  . ESOPHAGOGASTRODUODENOSCOPY (EGD) WITH PROPOFOL N/A 08/15/2018   Procedure: ESOPHAGOGASTRODUODENOSCOPY (EGD) WITH PROPOFOL;  Surgeon: Daneil Dolin, MD;  Location: [facility];  Service: Endoscopy;  Laterality: N/A;  1:45pm  . ESOPHAGOGASTRODUODENOSCOPY (EGD) WITH PROPOFOL N/A 09/15/2018   Dr. Gala Romney: Grade 2 esophageal varices, portal gastropathy, normal first and second duodenum, s/p banding X 5. Surveillance in 1 year  . GIVENS CAPSULE STUDY N/A 05/04/2014   multiple polypoid-like lesions throughout small bowel, scattered superficial non-bleeding erosions more distal bowel, referred to [facility] for  enteroscopy. Capsule study not complete, poor images.   Marland Kitchen TRIGGER FINGER RELEASE Left 12/03/2016   Procedure: RELEASE TRIGGER FINGER/A-1 PULLEY LEFT LONG TRIGGER FINGER RELEASE;  Surgeon: Carole Civil, MD;  Location: [facility] ORS;  Service: Orthopedics;  Laterality: Left;  . urosepsis      Prior to Admission medications   Medication Sig Start Date End Date Taking? Authorizing Provider  amitriptyline (ELAVIL) 25 MG tablet Take 25 mg by mouth at bedtime.   Yes [provider]  aspirin EC 81 MG tablet Take 1 tablet (81 mg total) by mouth daily with breakfast. 06/15/19  Yes Emokpae, Courage, MD  ferrous sulfate 325 (65 FE) MG tablet Take 1 tablet (325 mg total) by mouth daily. 07/05/19  Yes Emokpae, Courage, MD  furosemide (LASIX) 80 MG tablet Take 80 mg by mouth daily.   Yes [provider]  gabapentin (NEURONTIN) 100 MG capsule Take 1 capsule (100 mg total) by mouth at bedtime. Patient taking differently: Take 100 mg by mouth daily.  07/04/19  Yes Emokpae, Courage, MD  insulin glargine (LANTUS) 100 UNIT/ML injection Inject 0.24 mLs (24 Units total) into the skin 2 (two) times daily. 07/04/19  Yes Emokpae, Courage, MD  lactulose (CHRONULAC) 10 GM/15ML solution Take 45 mLs (30 g total) by mouth 2 (two) times daily. Patient taking differently: Take 10 g by mouth 2 (two) times daily.  07/04/19  Yes Emokpae, Courage, MD  lansoprazole (PREVACID) 30 MG capsule TAKE (1) CAPSULE BY MOUTH TWICE DAILY BEFORE A MEAL Patient taking differently: Take 30 mg by mouth daily.  07/31/19  Yes Mahala Menghini, PA-C  levothyroxine (SYNTHROID) 100 MCG tablet TAKE 1 TABLET BY MOUTH ONCE DAILY. Patient taking differently: Take 100 mcg by mouth daily before breakfast.  06/01/19  Yes Nida, Marella Chimes, MD  ondansetron (ZOFRAN) 4 MG tablet Take 1 tablet (4 mg total) by mouth 4 (four) times daily -  before meals and at bedtime. Patient taking differently: Take 4 mg by mouth 4 (four) times daily -  before  meals and at bedtime. As needed 06/15/19  Yes Emokpae, Courage, MD  rifaximin (XIFAXAN) 550 MG TABS tablet Take 1 tablet (550 mg total) by mouth 2 (two) times daily. 07/16/19 08/15/19 Yes Shah, Pratik D, DO  VICTOZA 18 MG/3ML SOPN INJECT 1.8MG SUBCUTANEOUSLY ONCE DAILY. Patient taking differently: Inject 1.8 mg into the skin daily.  04/10/19  Yes Cassandria Anger, MD    Allergies as of 08/01/2019 - Review Complete 08/01/2019  Allergen Reaction Noted  . Erythromycin Hives 02/15/2014  . Niacin Rash and Shortness Of Breath 03/05/2014  . Penicillins Shortness Of Breath and Other (See Comments) 02/15/2014  . Sulfa antibiotics Hives 02/15/2014  . Ciprofloxacin Itching and Nausea And Vomiting 05/23/2018  . Statins Other (See Comments) 03/05/2014  . Codeine Itching 11/26/2016  . Fluticasone Rash 07/25/2014  . Tramadol Rash 07/25/2014    Family History  Problem Relation Age of Onset  . Diabetes Mother   . Hypertension Mother   . Kidney failure Mother   . Blindness Mother   . Aneurysm Father   . Colon cancer Neg  Hx     Social History   Socioeconomic History  . Marital status: Married    Spouse name: Not on file  . Number of children: Not on file  . Years of education: Not on file  . Highest education level: Not on file  Occupational History  . Not on file  Social Needs  . Financial resource strain: Not on file  . Food insecurity    Worry: Not on file    Inability: Not on file  . Transportation needs    Medical: Not on file    Non-medical: Not on file  Tobacco Use  . Smoking status: Former Smoker    Packs/day: 2.00    Years: 35.00    Pack years: 70.00    Quit date: 05/05/2003    Years since quitting: 16.2  . Smokeless tobacco: Never Used  . Tobacco comment: Quit smoking 11 years ago  Substance and Sexual Activity  . Alcohol use: No  . Drug use: No  . Sexual activity: Not Currently    Birth control/protection: Surgical  Lifestyle  . Physical activity    Days per  week: Not on file    Minutes per session: Not on file  . Stress: Not on file  Relationships  . Social Herbalist on phone: Not on file    Gets together: Not on file    Attends religious service: Not on file    Active member of club or organization: Not on file    Attends meetings of clubs or organizations: Not on file    Relationship status: Not on file  . Intimate partner violence    Fear of current or ex partner: Not on file    Emotionally abused: Not on file    Physically abused: Not on file    Forced sexual activity: Not on file  Other Topics Concern  . Not on file  Social History Narrative  . Not on file    Review of Systems: See HPI, otherwise negative ROS  Physical Exam: BP (!) 148/81   Pulse 97   Temp (!) 97.2 F (36.2 C) (Oral)   Ht 5' 2"  (1.575 m)   Wt 213 lb (96.6 kg)   BMI 38.96 kg/m  General:   Alert, in a wheelchair.  Pleasant and cooperative in NAD; she is alert conversant and appropriate.  She is oriented.  She has no asterixis. Lungs:  Clear throughout to auscultation.   No wheezes, crackles, or rhonchi. No acute distress. Heart:  Regular rate and rhythm; no murmurs, clicks, rubs,  or gallops. Abdomen: Moderately distended positive bowel sounds, soft, nontender. Extremities: 1-2+ lower extremity edema bilaterally.   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  She is marginally compensated at this time.  I am glad to see she was able to resume diuretic therapy.  She needs to work on adhering to a 2 g sodium diet.  We are limited on analgesic regimens we can provide  without significant side effects.  Recommendations:Information on 2 gram sodium diet (limit sodium to 2 grams daily)  Continue Xifaxan and lactulose daily - goal  - 3 semi-formed stool daily  Continue lansoprazole daily  Continue to follow instructions on diuretics per nephrology.  Weight at home today;  then weigh periodically; if weight increases by more than 5 pounds -  let me know  May use acetaminophen up to 2 grams daily as needed for pain (avoid NSAID  medications).  Acetaminophen safe and cirrhosis as long as no more than 2 g taken daily.  Follow up with Dr. Bartolo Darter as directed  Office  Visit here in 1 month     Notice: This dictation was prepared with Dragon dictation along with smaller phrase technology. Any transcriptional errors that result from this process are unintentional and may not be corrected upon review.

## 2019-08-01 NOTE — Telephone Encounter (Signed)
No

## 2019-08-01 NOTE — Telephone Encounter (Signed)
Patient is scheduled to come in for OV today.

## 2019-08-01 NOTE — Telephone Encounter (Signed)
Recall for ct

## 2019-08-01 NOTE — Telephone Encounter (Signed)
AB since patient is on hospice does she still need the CT

## 2019-08-10 ENCOUNTER — Other Ambulatory Visit: Payer: Self-pay

## 2019-08-10 ENCOUNTER — Encounter (HOSPITAL_COMMUNITY): Payer: Self-pay

## 2019-08-10 ENCOUNTER — Ambulatory Visit (HOSPITAL_COMMUNITY)
Admission: RE | Admit: 2019-08-10 | Discharge: 2019-08-10 | Disposition: A | Discharge status: Home / Self Care | Payer: Medicare Other | Source: Ambulatory Visit | Attending: Gastroenterology | Admitting: Gastroenterology

## 2019-08-10 DIAGNOSIS — R188 Other ascites: Secondary | ICD-10-CM | POA: Diagnosis not present

## 2019-08-10 LAB — BODY FLUID CELL COUNT WITH DIFFERENTIAL
Eos, Fluid: 0 %
Lymphs, Fluid: 76 %
Monocyte-Macrophage-Serous Fluid: 23 % — ABNORMAL LOW (ref 50–90)
Neutrophil Count, Fluid: 1 % (ref 0–25)
Other Cells, Fluid: 1 %
Total Nucleated Cell Count, Fluid: 219 cu mm (ref 0–1000)

## 2019-08-10 MED ORDER — ALBUMIN HUMAN 25 % IV SOLN
25.0000 g | Freq: Once | INTRAVENOUS | Status: AC
  Administered 2019-08-10: 12:00:00 25 g via INTRAVENOUS

## 2019-08-10 MED ORDER — ALBUMIN HUMAN 25 % IV SOLN
INTRAVENOUS | Status: AC
  Administered 2019-08-10: 12:00:00 25 g via INTRAVENOUS
  Filled 2019-08-10: qty 100

## 2019-08-10 NOTE — Progress Notes (Signed)
Paracentesis complete no signs of distress.

## 2019-08-10 NOTE — Procedures (Signed)
PreOperative Dx: Cirrhosis, ascites Postoperative Dx: Cirrhosis, ascites Procedure:   US guided paracentesis Radiologist:  Thornton Papas Anesthesia:  10 ml of1% lidocaine Specimen:  5.1 L of milky light yellow ascitic fluid EBL:   < 1 ml Complications: None

## 2019-08-11 LAB — PATHOLOGIST SMEAR REVIEW

## 2019-08-22 ENCOUNTER — Ambulatory Visit (HOSPITAL_COMMUNITY)
Admission: RE | Admit: 2019-08-22 | Discharge: 2019-08-22 | Disposition: A | Discharge status: Home / Self Care | Payer: Medicare Other | Source: Ambulatory Visit | Attending: Gastroenterology | Admitting: Gastroenterology

## 2019-08-22 ENCOUNTER — Other Ambulatory Visit: Payer: Self-pay

## 2019-08-22 DIAGNOSIS — R188 Other ascites: Secondary | ICD-10-CM | POA: Insufficient documentation

## 2019-08-22 LAB — BODY FLUID CELL COUNT WITH DIFFERENTIAL
Eos, Fluid: 0 %
Lymphs, Fluid: 74 %
Monocyte-Macrophage-Serous Fluid: 24 % — ABNORMAL LOW (ref 50–90)
Neutrophil Count, Fluid: 2 % (ref 0–25)
Other Cells, Fluid: 1 %
Total Nucleated Cell Count, Fluid: 244 cu mm (ref 0–1000)

## 2019-08-22 NOTE — Progress Notes (Signed)
Paracentesis complete no signs of distress.

## 2019-08-22 NOTE — Procedures (Signed)
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

## 2019-08-23 ENCOUNTER — Ambulatory Visit (HOSPITAL_COMMUNITY)

## 2019-08-23 LAB — PATHOLOGIST SMEAR REVIEW

## 2019-08-28 ENCOUNTER — Other Ambulatory Visit: Payer: Self-pay | Admitting: "Endocrinology

## 2019-08-30 ENCOUNTER — Other Ambulatory Visit: Payer: Self-pay

## 2019-08-30 ENCOUNTER — Ambulatory Visit (HOSPITAL_COMMUNITY)
Admission: RE | Admit: 2019-08-30 | Discharge: 2019-08-30 | Disposition: A | Discharge status: Home / Self Care | Payer: Medicare Other | Source: Ambulatory Visit | Attending: Internal Medicine | Admitting: Internal Medicine

## 2019-08-30 DIAGNOSIS — R188 Other ascites: Secondary | ICD-10-CM

## 2019-08-30 LAB — BODY FLUID CELL COUNT WITH DIFFERENTIAL
Eos, Fluid: 0 %
Lymphs, Fluid: 61 %
Monocyte-Macrophage-Serous Fluid: 32 % — ABNORMAL LOW (ref 50–90)
Neutrophil Count, Fluid: 7 % (ref 0–25)
Other Cells, Fluid: 1 %
Total Nucleated Cell Count, Fluid: 161 cu mm (ref 0–1000)

## 2019-08-30 NOTE — Progress Notes (Signed)
Pt presented for paracentesis, time out performed, pt tolerated procedure well, no distress noted, procedure finished at 12:15, total 5.2 liters drawn off, labs sent,

## 2019-08-30 NOTE — Procedures (Signed)
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

## 2019-09-04 ENCOUNTER — Ambulatory Visit (HOSPITAL_COMMUNITY)

## 2019-09-04 ENCOUNTER — Other Ambulatory Visit: Payer: Self-pay | Admitting: Gastroenterology

## 2019-09-04 ENCOUNTER — Other Ambulatory Visit (HOSPITAL_COMMUNITY): Payer: Self-pay | Admitting: Gastroenterology

## 2019-09-04 DIAGNOSIS — R188 Other ascites: Secondary | ICD-10-CM

## 2019-09-04 LAB — PATHOLOGIST SMEAR REVIEW

## 2019-09-05 ENCOUNTER — Other Ambulatory Visit: Payer: Self-pay

## 2019-09-05 ENCOUNTER — Ambulatory Visit (INDEPENDENT_AMBULATORY_CARE_PROVIDER_SITE_OTHER): Admitting: Gastroenterology

## 2019-09-05 ENCOUNTER — Encounter: Payer: Self-pay | Admitting: Gastroenterology

## 2019-09-05 DIAGNOSIS — G9340 Encephalopathy, unspecified: Secondary | ICD-10-CM | POA: Diagnosis not present

## 2019-09-05 DIAGNOSIS — R188 Other ascites: Secondary | ICD-10-CM

## 2019-09-05 DIAGNOSIS — K7290 Hepatic failure, unspecified without coma: Secondary | ICD-10-CM

## 2019-09-05 DIAGNOSIS — K7581 Nonalcoholic steatohepatitis (NASH): Secondary | ICD-10-CM

## 2019-09-05 DIAGNOSIS — K7469 Other cirrhosis of liver: Secondary | ICD-10-CM

## 2019-09-05 NOTE — Progress Notes (Signed)
Primary Care Physician:  Vidal Schwalbe, MD  Primary GI: Dr. Gala Romney   Patient Location: Home   Provider Location: [facility] office   Reason for Visit: Follow-up   Persons present on the virtual encounter, with roles: Patient, NP, and daughter   Total time (minutes) spent on medical discussion: 15 minutes   Due to COVID-19, visit was conducted using virtual method.  Visit was requested by patient.  Virtual Visit via Telephone Note Due to COVID-19, visit is conducted virtually and was requested by patient.   I connected with Penny Martinez on 09/05/19 at  3:30 PM EST by telephone and verified that I am speaking with the correct person using two identifiers.   I discussed the limitations, risks, security and privacy concerns of performing an evaluation and management service by telephone and the availability of in person appointments. I also discussed with the patient that there may be a patient responsible charge related to this service. The patient expressed understanding and agreed to proceed.  Chief Complaint  Patient presents with  . Cirrhosis    increased weakness     History of Present Illness: 68 year old female presenting via video call today in follow-up for end-stage NASH cirrhosis. She has had refractory ascites, encephalopathy, now requiring frequent paras. 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 Followed by Nephrology. Regimen includes Lasix 80 mg daily, Xifaxan, and Lactulose. Daughter is present at bedside.   Abdomen is tight. Last para a week ago. Para weekly. Scheduled for 12/30. Tired and weak. Worsening since Saturday. Able to get into a chair with family help. BMs usually 2-3 per day but only one today thus far. Lasix 80 mg daily. Hospice comes once per week. Requesting PleurX. Daughter understands risks of infection and requesting for comfort purposes.    Kimberly [facility] will be calling. Patient fell  earlier this week.   Past Medical History:  Diagnosis Date  . Acid reflux   . Anemia   . Arthritis   . C. difficile colitis June/July 2015  . Cancer ([place])    skin cancer  . CHF (congestive heart failure) ([place])   . Cirrhosis ([place])    likely due to NASH. Negative viral markers 2015  . Complication of anesthesia   . COPD (chronic obstructive pulmonary disease) ([place])   . Diabetes mellitus without complication ([place])   . GERD (gastroesophageal reflux disease)   . History of kidney stones   . Hypercholesteremia   . Hypertension   . Hypothyroidism   . IBS (irritable bowel syndrome)   . Kidney disease   . Liver disease   . Neuropathy   . PONV (postoperative nausea and vomiting)   . Sleep apnea   . Thyroid disease      Past Surgical History:  Procedure Laterality Date  . ABDOMINAL HYSTERECTOMY    . APPENDECTOMY    . CAROTID ARTERY - SUBCLAVIAN ARTERY BYPASS GRAFT Right   . CARPAL TUNNEL RELEASE Right   . CARPAL TUNNEL RELEASE Left 12/03/2016   Procedure: CARPAL TUNNEL RELEASE;  Surgeon: Carole Civil, MD;  Location: [facility];  Service: Orthopedics;  Laterality: Left;  . CATARACT EXTRACTION Bilateral   . CHOLECYSTECTOMY    . COLONOSCOPY N/A 02/19/2014   Dr. Gala Romney: rectal varices, rectal polyp overlying a varix non-manipulated  . ESOPHAGEAL BANDING N/A 08/15/2018   Procedure: ESOPHAGEAL BANDING;  Surgeon: Daneil Dolin, MD;  Location: [facility];  Service: Endoscopy;  Laterality: N/A;  . ESOPHAGEAL BANDING N/A 09/15/2018   Procedure: ESOPHAGEAL BANDING;  Surgeon: Daneil Dolin, MD;  Location: [facility];  Service: Endoscopy;  Laterality: N/A;  . ESOPHAGOGASTRODUODENOSCOPY N/A 02/19/2014   Dr. Gala Romney: 4 columns of grade 2 esophageal varices, multiple gastric polyps with largest biopsied and hyperplastic. Negative H.pylori  . ESOPHAGOGASTRODUODENOSCOPY N/A 04/13/2018   Dr. Gala Romney: Grade 3 esophageal varices, no esophagitis, portal gastropathy, multiple hyperplastic appearing  polyps  . ESOPHAGOGASTRODUODENOSCOPY (EGD) WITH PROPOFOL N/A 08/15/2018   Procedure: ESOPHAGOGASTRODUODENOSCOPY (EGD) WITH PROPOFOL;  Surgeon: Daneil Dolin, MD;  Location: [facility];  Service: Endoscopy;  Laterality: N/A;  1:45pm  . ESOPHAGOGASTRODUODENOSCOPY (EGD) WITH PROPOFOL N/A 09/15/2018   Dr. Gala Romney: Grade 2 esophageal varices, portal gastropathy, normal first and second duodenum, s/p banding X 5. Surveillance in 1 year  . GIVENS CAPSULE STUDY N/A 05/04/2014   multiple polypoid-like lesions throughout small bowel, scattered superficial non-bleeding erosions more distal bowel, referred to [facility] for enteroscopy. Capsule study not complete, poor images.   Marland Kitchen TRIGGER FINGER RELEASE Left 12/03/2016   Procedure: RELEASE TRIGGER FINGER/A-1 PULLEY LEFT LONG TRIGGER FINGER RELEASE;  Surgeon: Carole Civil, MD;  Location: [facility];  Service: Orthopedics;  Laterality: Left;  . urosepsis       Current Meds  Medication Sig  . amitriptyline (ELAVIL) 25 MG tablet Take 25 mg by mouth at bedtime.  Marland Kitchen amoxicillin-clavulanate (AUGMENTIN) 875-125 MG tablet Take 1 tablet by mouth 2 (two) times daily.  Marland Kitchen aspirin EC 81 MG tablet Take 1 tablet (81 mg total) by mouth daily with breakfast.  . ferrous sulfate 325 (65 FE) MG tablet Take 1 tablet (325 mg total) by mouth daily.  . furosemide (LASIX) 80 MG tablet Take 80 mg by mouth daily.  Marland Kitchen gabapentin (NEURONTIN) 100 MG capsule Take 1 capsule (100 mg total) by mouth at bedtime. (Patient taking differently: Take 100 mg by mouth daily. )  . insulin regular (NOVOLIN R) 100 units/mL injection Inject 24 Units into the skin 3 (three) times daily before meals.  . lactulose (CHRONULAC) 10 GM/15ML solution Take 45 mLs (30 g total) by mouth 2 (two) times daily. (Patient taking differently: Take 10 g by mouth 2 (two) times daily. )  . lansoprazole (PREVACID) 30 MG capsule TAKE (1) CAPSULE BY MOUTH TWICE DAILY BEFORE A MEAL (Patient taking differently: Take 30 mg by mouth  daily. )  . levothyroxine (SYNTHROID) 100 MCG tablet TAKE 1 TABLET BY MOUTH ONCE DAILY. (Patient taking differently: Take 100 mcg by mouth daily before breakfast. )  . ondansetron (ZOFRAN) 4 MG tablet Take 1 tablet (4 mg total) by mouth 4 (four) times daily -  before meals and at bedtime. (Patient taking differently: Take 4 mg by mouth 4 (four) times daily -  before meals and at bedtime. As needed)  . VICTOZA 18 MG/3ML SOPN INJECT 1.8MG SUBCUTANEOUSLY ONCE DAILY.  Marland Kitchen XIFAXAN 550 MG TABS tablet Take 550 mg by mouth 2 (two) times daily.     Family History  Problem Relation Age of Onset  . Diabetes Mother   . Hypertension Mother   . Kidney failure Mother   . Blindness Mother   . Aneurysm Father   . Colon cancer Neg Hx     Social History   Socioeconomic History  . Marital status: Married    Spouse name: Not on file  . Number of children: Not on file  . Years of education: Not on file  . Highest education  level: Not on file  Occupational History  . Not on file  Tobacco Use  . Smoking status: Former Smoker    Packs/day: 2.00    Years: 35.00    Pack years: 70.00    Quit date: 05/05/2003    Years since quitting: 16.3  . Smokeless tobacco: Never Used  . Tobacco comment: Quit smoking 11 years ago  Substance and Sexual Activity  . Alcohol use: No  . Drug use: No  . Sexual activity: Not Currently    Birth control/protection: Surgical  Other Topics Concern  . Not on file  Social History Narrative  . Not on file   Social Determinants of Health   Financial Resource Strain:   . Difficulty of Paying Living Expenses: Not on file  Food Insecurity:   . Worried About Charity fundraiser in the Last Year: Not on file  . Ran Out of Food in the Last Year: Not on file  Transportation Needs:   . Lack of Transportation (Medical): Not on file  . Lack of Transportation (Non-Medical): Not on file  Physical Activity:   . Days of Exercise per Week: Not on file  . Minutes of Exercise per  Session: Not on file  Stress:   . Feeling of Stress : Not on file  Social Connections:   . Frequency of Communication with Friends and Family: Not on file  . Frequency of Social Gatherings with Friends and Family: Not on file  . Attends Religious Services: Not on file  . Active Member of Clubs or Organizations: Not on file  . Attends Archivist Meetings: Not on file  . Marital Status: Not on file       Review of Systems: Gen: see HPI CV: Denies chest pain, palpitations, syncope, peripheral edema, and claudication. Resp: Denies dyspnea at rest, cough, wheezing, coughing up blood, and pleurisy. GI: see HPI Derm: Denies rash, itching, dry skin Psych: Denies depression, anxiety, memory loss, confusion. No homicidal or suicidal ideation.  Heme: Denies bruising, bleeding, and enlarged lymph nodes.  Observations/Objective: Laying in bed and appears weak. Dozing intermittently but easily awakened. Chronically ill-appearing.  Assessment and Plan: Very pleasant 68 year old female who has declined over the past few months somewhat rapidly, now with end-stage liver disease due to NASH, complicated by refractory ascites, anasarca, encephalopathy. She has hospice following her at home, and she is still presenting for weekly paras. Daughter is requesting that PleurX catheter be considered again; hospice is recommending this and has discussed previously. Originally, this was on hold due to risk of infection. However, she is declining and prognosis is poor. She is a high fall risk and finding it difficult to present for weekly paras. I spoke with daughter and patient regarding potential risk of infection with indwelling catheter; however, both daughter and patient are desiring this for comfort measures and quality of life. As her prognosis is poor, will discuss with Dr. Gala Romney again in future. I have asked daughter to have hospice reach out to me. In the interim, titrate lactulose to achieve 3  soft BMs daily.   We will have her return prn and call if any concerns; life expectancy is limited, and family is wanting to focus on comfort at this point.   Follow Up Instructions:    I discussed the assessment and treatment plan with the patient. The patient was provided an opportunity to ask questions and all were answered. The patient agreed with the plan and demonstrated an understanding of the  instructions.   The patient was advised to call back or seek an in-person evaluation if the symptoms worsen or if the condition fails to improve as anticipated.  I provided 15 minutes of video ace-to-face time during this encounter.  Annitta Needs, PhD, ANP-BC [facility]

## 2019-09-05 NOTE — Patient Instructions (Signed)
I will speak with Dr. Gala Romney about the drain.  We will see you back as needed.  I hope you have a peaceful New Year with Family! I miss seeing you here!  I enjoyed seeing you again today! As you know, I value our relationship and want to provide genuine, compassionate, and quality care. I welcome your feedback. If you receive a survey regarding your visit,  I greatly appreciate you taking time to fill this out. See you next time!  Annitta Needs, PhD, ANP-BC [facility]

## 2019-09-06 ENCOUNTER — Other Ambulatory Visit: Payer: Self-pay

## 2019-09-06 ENCOUNTER — Ambulatory Visit (HOSPITAL_COMMUNITY)
Admission: RE | Admit: 2019-09-06 | Discharge: 2019-09-06 | Disposition: A | Discharge status: Home / Self Care | Payer: Medicare Other | Source: Ambulatory Visit | Attending: Gastroenterology | Admitting: Gastroenterology

## 2019-09-06 ENCOUNTER — Emergency Department (HOSPITAL_COMMUNITY): Payer: Medicare Other

## 2019-09-06 ENCOUNTER — Inpatient Hospital Stay (HOSPITAL_COMMUNITY): Payer: Medicare Other

## 2019-09-06 ENCOUNTER — Emergency Department (HOSPITAL_COMMUNITY)
Admission: EM | Admit: 2019-09-06 | Discharge: 2019-09-06 | Disposition: A | Discharge status: Home / Self Care | Payer: Medicare Other | Source: Home / Self Care

## 2019-09-06 ENCOUNTER — Encounter (HOSPITAL_COMMUNITY): Payer: Self-pay

## 2019-09-06 ENCOUNTER — Inpatient Hospital Stay (HOSPITAL_COMMUNITY)
Admission: EM | Admit: 2019-09-06 | Discharge: 2019-10-09 | DRG: 441 | Disposition: E | Payer: Medicare Other | Attending: Internal Medicine | Admitting: Internal Medicine

## 2019-09-06 DIAGNOSIS — E7220 Disorder of urea cycle metabolism, unspecified: Secondary | ICD-10-CM | POA: Diagnosis not present

## 2019-09-06 DIAGNOSIS — Z95820 Peripheral vascular angioplasty status with implants and grafts: Secondary | ICD-10-CM

## 2019-09-06 DIAGNOSIS — I509 Heart failure, unspecified: Secondary | ICD-10-CM | POA: Diagnosis present

## 2019-09-06 DIAGNOSIS — E1165 Type 2 diabetes mellitus with hyperglycemia: Secondary | ICD-10-CM | POA: Diagnosis present

## 2019-09-06 DIAGNOSIS — I4510 Unspecified right bundle-branch block: Secondary | ICD-10-CM | POA: Diagnosis present

## 2019-09-06 DIAGNOSIS — D500 Iron deficiency anemia secondary to blood loss (chronic): Secondary | ICD-10-CM | POA: Diagnosis present

## 2019-09-06 DIAGNOSIS — Z20822 Contact with and (suspected) exposure to covid-19: Secondary | ICD-10-CM | POA: Diagnosis present

## 2019-09-06 DIAGNOSIS — Z9049 Acquired absence of other specified parts of digestive tract: Secondary | ICD-10-CM

## 2019-09-06 DIAGNOSIS — Z882 Allergy status to sulfonamides status: Secondary | ICD-10-CM

## 2019-09-06 DIAGNOSIS — Z87442 Personal history of urinary calculi: Secondary | ICD-10-CM

## 2019-09-06 DIAGNOSIS — E871 Hypo-osmolality and hyponatremia: Secondary | ICD-10-CM | POA: Diagnosis present

## 2019-09-06 DIAGNOSIS — Z821 Family history of blindness and visual loss: Secondary | ICD-10-CM

## 2019-09-06 DIAGNOSIS — Z6841 Body Mass Index (BMI) 40.0 and over, adult: Secondary | ICD-10-CM

## 2019-09-06 DIAGNOSIS — K7460 Unspecified cirrhosis of liver: Secondary | ICD-10-CM | POA: Diagnosis present

## 2019-09-06 DIAGNOSIS — E876 Hypokalemia: Secondary | ICD-10-CM | POA: Diagnosis not present

## 2019-09-06 DIAGNOSIS — Z66 Do not resuscitate: Secondary | ICD-10-CM | POA: Diagnosis present

## 2019-09-06 DIAGNOSIS — E1140 Type 2 diabetes mellitus with diabetic neuropathy, unspecified: Secondary | ICD-10-CM | POA: Diagnosis present

## 2019-09-06 DIAGNOSIS — Z9841 Cataract extraction status, right eye: Secondary | ICD-10-CM

## 2019-09-06 DIAGNOSIS — Z9181 History of falling: Secondary | ICD-10-CM

## 2019-09-06 DIAGNOSIS — J181 Lobar pneumonia, unspecified organism: Secondary | ICD-10-CM | POA: Diagnosis present

## 2019-09-06 DIAGNOSIS — N183 Chronic kidney disease, stage 3 unspecified: Secondary | ICD-10-CM | POA: Diagnosis present

## 2019-09-06 DIAGNOSIS — D696 Thrombocytopenia, unspecified: Secondary | ICD-10-CM | POA: Diagnosis not present

## 2019-09-06 DIAGNOSIS — Z79899 Other long term (current) drug therapy: Secondary | ICD-10-CM

## 2019-09-06 DIAGNOSIS — I8511 Secondary esophageal varices with bleeding: Secondary | ICD-10-CM | POA: Diagnosis present

## 2019-09-06 DIAGNOSIS — Z841 Family history of disorders of kidney and ureter: Secondary | ICD-10-CM

## 2019-09-06 DIAGNOSIS — Z87891 Personal history of nicotine dependence: Secondary | ICD-10-CM

## 2019-09-06 DIAGNOSIS — R188 Other ascites: Secondary | ICD-10-CM

## 2019-09-06 DIAGNOSIS — Z885 Allergy status to narcotic agent status: Secondary | ICD-10-CM

## 2019-09-06 DIAGNOSIS — K920 Hematemesis: Secondary | ICD-10-CM | POA: Diagnosis present

## 2019-09-06 DIAGNOSIS — E038 Other specified hypothyroidism: Secondary | ICD-10-CM | POA: Diagnosis present

## 2019-09-06 DIAGNOSIS — J440 Chronic obstructive pulmonary disease with acute lower respiratory infection: Secondary | ICD-10-CM | POA: Diagnosis present

## 2019-09-06 DIAGNOSIS — K7581 Nonalcoholic steatohepatitis (NASH): Secondary | ICD-10-CM | POA: Diagnosis present

## 2019-09-06 DIAGNOSIS — Z9842 Cataract extraction status, left eye: Secondary | ICD-10-CM

## 2019-09-06 DIAGNOSIS — I8501 Esophageal varices with bleeding: Secondary | ICD-10-CM | POA: Diagnosis not present

## 2019-09-06 DIAGNOSIS — R296 Repeated falls: Secondary | ICD-10-CM | POA: Diagnosis present

## 2019-09-06 DIAGNOSIS — F0390 Unspecified dementia without behavioral disturbance: Secondary | ICD-10-CM | POA: Diagnosis present

## 2019-09-06 DIAGNOSIS — Z8249 Family history of ischemic heart disease and other diseases of the circulatory system: Secondary | ICD-10-CM

## 2019-09-06 DIAGNOSIS — I130 Hypertensive heart and chronic kidney disease with heart failure and stage 1 through stage 4 chronic kidney disease, or unspecified chronic kidney disease: Secondary | ICD-10-CM | POA: Diagnosis present

## 2019-09-06 DIAGNOSIS — N184 Chronic kidney disease, stage 4 (severe): Secondary | ICD-10-CM

## 2019-09-06 DIAGNOSIS — Z7982 Long term (current) use of aspirin: Secondary | ICD-10-CM

## 2019-09-06 DIAGNOSIS — Z881 Allergy status to other antibiotic agents status: Secondary | ICD-10-CM

## 2019-09-06 DIAGNOSIS — Z888 Allergy status to other drugs, medicaments and biological substances status: Secondary | ICD-10-CM

## 2019-09-06 DIAGNOSIS — J189 Pneumonia, unspecified organism: Secondary | ICD-10-CM

## 2019-09-06 DIAGNOSIS — K219 Gastro-esophageal reflux disease without esophagitis: Secondary | ICD-10-CM | POA: Diagnosis present

## 2019-09-06 DIAGNOSIS — N390 Urinary tract infection, site not specified: Secondary | ICD-10-CM | POA: Diagnosis present

## 2019-09-06 DIAGNOSIS — K7469 Other cirrhosis of liver: Secondary | ICD-10-CM | POA: Diagnosis not present

## 2019-09-06 DIAGNOSIS — E875 Hyperkalemia: Secondary | ICD-10-CM | POA: Diagnosis not present

## 2019-09-06 DIAGNOSIS — Z90710 Acquired absence of both cervix and uterus: Secondary | ICD-10-CM

## 2019-09-06 DIAGNOSIS — Z85828 Personal history of other malignant neoplasm of skin: Secondary | ICD-10-CM

## 2019-09-06 DIAGNOSIS — Z515 Encounter for palliative care: Secondary | ICD-10-CM | POA: Diagnosis present

## 2019-09-06 DIAGNOSIS — K580 Irritable bowel syndrome with diarrhea: Secondary | ICD-10-CM | POA: Diagnosis present

## 2019-09-06 DIAGNOSIS — K7200 Acute and subacute hepatic failure without coma: Secondary | ICD-10-CM | POA: Diagnosis not present

## 2019-09-06 DIAGNOSIS — Z833 Family history of diabetes mellitus: Secondary | ICD-10-CM

## 2019-09-06 DIAGNOSIS — Z79890 Hormone replacement therapy (postmenopausal): Secondary | ICD-10-CM

## 2019-09-06 DIAGNOSIS — R918 Other nonspecific abnormal finding of lung field: Secondary | ICD-10-CM | POA: Diagnosis present

## 2019-09-06 DIAGNOSIS — E1122 Type 2 diabetes mellitus with diabetic chronic kidney disease: Secondary | ICD-10-CM | POA: Diagnosis present

## 2019-09-06 DIAGNOSIS — G4730 Sleep apnea, unspecified: Secondary | ICD-10-CM | POA: Diagnosis present

## 2019-09-06 DIAGNOSIS — Z8719 Personal history of other diseases of the digestive system: Secondary | ICD-10-CM

## 2019-09-06 DIAGNOSIS — L89150 Pressure ulcer of sacral region, unstageable: Secondary | ICD-10-CM | POA: Diagnosis present

## 2019-09-06 DIAGNOSIS — Z7901 Long term (current) use of anticoagulants: Secondary | ICD-10-CM

## 2019-09-06 DIAGNOSIS — Z880 Allergy status to penicillin: Secondary | ICD-10-CM

## 2019-09-06 DIAGNOSIS — Z8619 Personal history of other infectious and parasitic diseases: Secondary | ICD-10-CM

## 2019-09-06 DIAGNOSIS — Y95 Nosocomial condition: Secondary | ICD-10-CM | POA: Diagnosis present

## 2019-09-06 DIAGNOSIS — Z794 Long term (current) use of insulin: Secondary | ICD-10-CM

## 2019-09-06 DIAGNOSIS — K7682 Hepatic encephalopathy: Secondary | ICD-10-CM | POA: Diagnosis present

## 2019-09-06 DIAGNOSIS — R531 Weakness: Secondary | ICD-10-CM | POA: Diagnosis present

## 2019-09-06 DIAGNOSIS — IMO0002 Reserved for concepts with insufficient information to code with codable children: Secondary | ICD-10-CM | POA: Diagnosis present

## 2019-09-06 LAB — CBC WITH DIFFERENTIAL/PLATELET
Abs Immature Granulocytes: 0.25 10*3/uL — ABNORMAL HIGH (ref 0.00–0.07)
Basophils Absolute: 0.1 10*3/uL (ref 0.0–0.1)
Basophils Relative: 1 %
Eosinophils Absolute: 0.1 10*3/uL (ref 0.0–0.5)
Eosinophils Relative: 1 %
HCT: 38.6 % (ref 36.0–46.0)
Hemoglobin: 12 g/dL (ref 12.0–15.0)
Immature Granulocytes: 3 %
Lymphocytes Relative: 8 %
Lymphs Abs: 0.8 10*3/uL (ref 0.7–4.0)
MCH: 27 pg (ref 26.0–34.0)
MCHC: 31.1 g/dL (ref 30.0–36.0)
MCV: 86.7 fL (ref 80.0–100.0)
Monocytes Absolute: 0.8 10*3/uL (ref 0.1–1.0)
Monocytes Relative: 8 %
Neutro Abs: 8 10*3/uL — ABNORMAL HIGH (ref 1.7–7.7)
Neutrophils Relative %: 79 %
Platelets: 186 10*3/uL (ref 150–400)
RBC: 4.45 MIL/uL (ref 3.87–5.11)
RDW: 17.4 % — ABNORMAL HIGH (ref 11.5–15.5)
WBC: 10 10*3/uL (ref 4.0–10.5)
nRBC: 0 % (ref 0.0–0.2)

## 2019-09-06 LAB — COMPREHENSIVE METABOLIC PANEL
ALT: 13 U/L (ref 0–44)
AST: 37 U/L (ref 15–41)
Albumin: 2.2 g/dL — ABNORMAL LOW (ref 3.5–5.0)
Alkaline Phosphatase: 126 U/L (ref 38–126)
Anion gap: 15 (ref 5–15)
BUN: 72 mg/dL — ABNORMAL HIGH (ref 8–23)
CO2: 19 mmol/L — ABNORMAL LOW (ref 22–32)
Calcium: 8.6 mg/dL — ABNORMAL LOW (ref 8.9–10.3)
Chloride: 95 mmol/L — ABNORMAL LOW (ref 98–111)
Creatinine, Ser: 2.05 mg/dL — ABNORMAL HIGH (ref 0.44–1.00)
GFR calc Af Amer: 28 mL/min — ABNORMAL LOW (ref >60)
GFR calc non Af Amer: 24 mL/min — ABNORMAL LOW (ref >60)
Glucose, Bld: 213 mg/dL — ABNORMAL HIGH (ref 70–99)
Potassium: 6.6 mmol/L (ref 3.5–5.1)
Sodium: 129 mmol/L — ABNORMAL LOW (ref 135–145)
Total Bilirubin: 1.2 mg/dL (ref 0.3–1.2)
Total Protein: 6.3 g/dL — ABNORMAL LOW (ref 6.5–8.1)

## 2019-09-06 LAB — BODY FLUID CELL COUNT WITH DIFFERENTIAL
Eos, Fluid: 0 %
Lymphs, Fluid: 76 %
Monocyte-Macrophage-Serous Fluid: 15 % — ABNORMAL LOW (ref 50–90)
Neutrophil Count, Fluid: 9 % (ref 0–25)
Other Cells, Fluid: REACTIVE %
Total Nucleated Cell Count, Fluid: 150 uL (ref 0–1000)

## 2019-09-06 LAB — POTASSIUM: Potassium: 6 mmol/L — ABNORMAL HIGH (ref 3.5–5.1)

## 2019-09-06 LAB — CBG MONITORING, ED
Glucose-Capillary: 182 mg/dL — ABNORMAL HIGH (ref 70–99)
Glucose-Capillary: 248 mg/dL — ABNORMAL HIGH (ref 70–99)
Glucose-Capillary: 238 mg/dL — ABNORMAL HIGH (ref 70–99)

## 2019-09-06 LAB — GRAM STAIN: Gram Stain: NONE SEEN

## 2019-09-06 LAB — RESPIRATORY PANEL BY RT PCR (FLU A&B, COVID)
Influenza A by PCR: NEGATIVE
Influenza B by PCR: NEGATIVE
SARS Coronavirus 2 by RT PCR: NEGATIVE

## 2019-09-06 LAB — LIPASE, BLOOD: Lipase: 20 U/L (ref 11–51)

## 2019-09-06 LAB — HEMOGLOBIN A1C
Hgb A1c MFr Bld: 7.8 % — ABNORMAL HIGH (ref 4.8–5.6)
Mean Plasma Glucose: 177.16 mg/dL

## 2019-09-06 LAB — BRAIN NATRIURETIC PEPTIDE: B Natriuretic Peptide: 72 pg/mL (ref 0.0–100.0)

## 2019-09-06 LAB — AMMONIA: Ammonia: 109 umol/L — ABNORMAL HIGH (ref 9–35)

## 2019-09-06 MED ORDER — FERROUS SULFATE 325 (65 FE) MG PO TABS: 325.0000 mg | ORAL_TABLET | Freq: Every day | ORAL | Status: DC

## 2019-09-06 MED ORDER — VANCOMYCIN HCL 750 MG/150ML IV SOLN: 750.0000 mg | INTRAVENOUS | Status: DC

## 2019-09-06 MED ORDER — INSULIN ASPART 100 UNIT/ML ~~LOC~~ SOLN
0.0000 [IU] | Freq: Three times a day (TID) | SUBCUTANEOUS | Status: DC
  Administered 2019-09-06: 18:00:00 5 [IU] via SUBCUTANEOUS
  Administered 2019-09-07: 8 [IU] via SUBCUTANEOUS
  Filled 2019-09-06: qty 1

## 2019-09-06 MED ORDER — ACETAMINOPHEN 650 MG RE SUPP: 650.0000 mg | Freq: Four times a day (QID) | RECTAL | Status: DC | PRN

## 2019-09-06 MED ORDER — LACTULOSE 10 GM/15ML PO SOLN
30.0000 g | Freq: Two times a day (BID) | ORAL | Status: DC
  Administered 2019-09-06: 30 g via ORAL
  Filled 2019-09-06: qty 60

## 2019-09-06 MED ORDER — SODIUM CHLORIDE 0.9 % IV SOLN
2.0000 g | Freq: Once | INTRAVENOUS | Status: AC
  Administered 2019-09-06: 2 g via INTRAVENOUS
  Filled 2019-09-06: qty 2

## 2019-09-06 MED ORDER — LACTULOSE 10 GM/15ML PO SOLN: 30.0000 g | Freq: Two times a day (BID) | ORAL | Status: DC

## 2019-09-06 MED ORDER — ONDANSETRON HCL 4 MG/2ML IJ SOLN
4.0000 mg | Freq: Four times a day (QID) | INTRAMUSCULAR | Status: DC | PRN
  Administered 2019-09-07: 4 mg via INTRAVENOUS
  Filled 2019-09-06: qty 2

## 2019-09-06 MED ORDER — RIFAXIMIN 550 MG PO TABS
550.0000 mg | ORAL_TABLET | Freq: Two times a day (BID) | ORAL | Status: DC
  Filled 2019-09-06 (×5): qty 1

## 2019-09-06 MED ORDER — ONDANSETRON HCL 4 MG PO TABS: 4.0000 mg | ORAL_TABLET | Freq: Four times a day (QID) | ORAL | Status: DC | PRN

## 2019-09-06 MED ORDER — SODIUM POLYSTYRENE SULFONATE 15 GM/60ML PO SUSP
45.0000 g | Freq: Once | ORAL | Status: AC
  Administered 2019-09-06: 45 g via ORAL
  Filled 2019-09-06: qty 180

## 2019-09-06 MED ORDER — PANTOPRAZOLE SODIUM 40 MG PO TBEC: 40.0000 mg | DELAYED_RELEASE_TABLET | Freq: Every day | ORAL | Status: DC

## 2019-09-06 MED ORDER — SODIUM CHLORIDE 0.9 % IV SOLN: 2.0000 g | Freq: Once | INTRAVENOUS | Status: DC

## 2019-09-06 MED ORDER — LEVOTHYROXINE SODIUM 100 MCG PO TABS
100.0000 ug | ORAL_TABLET | Freq: Every day | ORAL | Status: DC
  Administered 2019-09-06: 100 ug via ORAL
  Filled 2019-09-06: qty 2

## 2019-09-06 MED ORDER — INSULIN ASPART 100 UNIT/ML ~~LOC~~ SOLN
0.0000 [IU] | Freq: Every day | SUBCUTANEOUS | Status: DC
  Administered 2019-09-06: 2 [IU] via SUBCUTANEOUS
  Filled 2019-09-06: qty 1

## 2019-09-06 MED ORDER — SODIUM CHLORIDE 0.9 % IV SOLN: 2.0000 g | INTRAVENOUS | Status: DC

## 2019-09-06 MED ORDER — ASPIRIN EC 81 MG PO TBEC: 81.0000 mg | DELAYED_RELEASE_TABLET | Freq: Every day | ORAL | Status: DC

## 2019-09-06 MED ORDER — LACTULOSE 10 GM/15ML PO SOLN: 30.0000 g | Freq: Once | ORAL | Status: DC

## 2019-09-06 MED ORDER — HEPARIN SODIUM (PORCINE) 5000 UNIT/ML IJ SOLN
5000.0000 [IU] | Freq: Three times a day (TID) | INTRAMUSCULAR | Status: DC
  Administered 2019-09-06 – 2019-09-07 (×3): 5000 [IU] via SUBCUTANEOUS
  Filled 2019-09-06 (×4): qty 1

## 2019-09-06 MED ORDER — CALCIUM GLUCONATE-NACL 1-0.675 GM/50ML-% IV SOLN
1.0000 g | Freq: Once | INTRAVENOUS | Status: AC
  Administered 2019-09-06: 1000 mg via INTRAVENOUS
  Filled 2019-09-06: qty 50

## 2019-09-06 MED ORDER — ASPIRIN EC 81 MG PO TBEC
81.0000 mg | DELAYED_RELEASE_TABLET | Freq: Every day | ORAL | Status: DC
  Administered 2019-09-06 – 2019-09-07 (×2): 81 mg via ORAL
  Filled 2019-09-06 (×2): qty 1

## 2019-09-06 MED ORDER — ACETAMINOPHEN 325 MG PO TABS
650.0000 mg | ORAL_TABLET | Freq: Four times a day (QID) | ORAL | Status: DC | PRN
  Filled 2019-09-06: qty 2

## 2019-09-06 MED ORDER — RIFAXIMIN 550 MG PO TABS
550.0000 mg | ORAL_TABLET | Freq: Two times a day (BID) | ORAL | Status: DC
  Administered 2019-09-06 – 2019-09-07 (×2): 550 mg via ORAL
  Filled 2019-09-06 (×5): qty 1

## 2019-09-06 MED ORDER — SODIUM POLYSTYRENE SULFONATE 15 GM/60ML PO SUSP: 30.0000 g | Freq: Once | ORAL | Status: DC

## 2019-09-06 MED ORDER — VANCOMYCIN HCL 1750 MG/350ML IV SOLN
1750.0000 mg | Freq: Once | INTRAVENOUS | Status: AC
  Administered 2019-09-06: 1750 mg via INTRAVENOUS
  Filled 2019-09-06: qty 350

## 2019-09-06 MED ORDER — SODIUM CHLORIDE 0.9 % IV SOLN: 1.0000 g | Freq: Once | INTRAVENOUS | Status: DC

## 2019-09-06 NOTE — ED Notes (Signed)
Lab work sent over

## 2019-09-06 NOTE — ED Provider Notes (Signed)
[facility] EMERGENCY DEPARTMENT Provider Note   CSN: [id number] Arrival date & time: 08/30/2019  1136     History Chief Complaint  Patient presents with  . Weakness    Penny Martinez is a 68 y.o. female.  Patient with a history of nonalcoholic cirrhosis.  Patient was brought in by EMS originally was to get here to have paracentesis done at 12 noon.  With patient's daughter felt that patient has not been doing well where she has been weak and fatigued and difficulty walking for the last 4 days.  And that her blood sugars have been dropping at night.  Patient is also being treated for urinary tract infection and a pressure sore it appears the antibiotic may be Augmentin.  Patient is also on lactulose and Lasix.  Patient gets paracentesis done on a regular basis and today was her day.  Her GI doctor is Dr. Buford Dresser.  Daughter also wants a permanent catheter but that has been a point of contention with the gastroenterologist but they want that to be readdressed.  Here patient awake and will follow commands.  Patient's past medical history is also significant for C. difficile colitis in the past congestive heart failure and COPD and diabetes without complications.  And also known to have chronic renal failure.  But not on dialysis.  Patient had an admission in November for spontaneous bacterial peritonitis.  Patient denies any abdominal pain today.  Patient just wants to get a paracentesis and go home.        Past Medical History:  Diagnosis Date  . Acid reflux   . Anemia   . Arthritis   . C. difficile colitis June/July 2015  . Cancer ([place])    skin cancer  . CHF (congestive heart failure) ([place])   . Cirrhosis ([place])    likely due to NASH. Negative viral markers 2015  . Complication of anesthesia   . COPD (chronic obstructive pulmonary disease) ([place])   . Diabetes mellitus without complication ([place])   . GERD (gastroesophageal reflux disease)   . History of kidney stones   .  Hypercholesteremia   . Hypertension   . Hypothyroidism   . IBS (irritable bowel syndrome)   . Kidney disease   . Liver disease   . Neuropathy   . PONV (postoperative nausea and vomiting)   . Sleep apnea   . Thyroid disease     Patient Active Problem List   Diagnosis Date Noted  . HCAP (healthcare-associated pneumonia) 08/09/2019  . Acute hepatic encephalopathy 08/12/2019  . Lobar pneumonia ([place]) 08/20/2019  . CKD (chronic kidney disease) stage 4, GFR 15-29 ml/min (HCC) 08/24/2019  . Spontaneous bacterial peritonitis ([place]) 07/12/2019  . CKD stage 5 due to type 2 diabetes mellitus ([place]) 07/12/2019  . Abdominal pain 07/12/2019  . Left lower lobe pulmonary infiltrate 07/12/2019  . Hyperammonemia ([place]) 06/27/2019  . Acute on chronic renal failure ([place]) 06/27/2019  . CAP (community acquired pneumonia) 06/13/2019  . GERD (gastroesophageal reflux disease) 10/25/2018  . LUQ fullness 07/25/2018  . CKD stage 3 due to type 2 diabetes mellitus ([place]) 05/24/2018  . Hyponatremia syndrome 05/24/2018  . Other dysphagia 09/15/2017  . Uncontrolled type 2 diabetes mellitus with complication ([place]) 42/70/6237  . Mixed hyperlipidemia 04/27/2017  . Hyponatremia 04/27/2017  . Chronic diarrhea 12/15/2016  . Carpal tunnel syndrome, left   . Trigger middle finger of left hand   . Thrombocytopenia ([place]) 11/04/2016  . Iron deficiency anemia due to chronic blood loss 01/31/2016  .  Other specified hypothyroidism 09/20/2015  . Essential hypertension, benign 07/12/2015  . Morbid obesity with BMI of 40.0-44.9, adult ([place]) 07/12/2015  . Hepatic cirrhosis ([place]) 04/02/2014  . Acute colitis 03/05/2014  . Abdominal pain, acute, left lower quadrant 03/05/2014  . Colitis 03/05/2014  . Splenomegaly, congestive, chronic 02/17/2014  . Sepsis ([place]) 02/15/2014  . Absolute anemia 02/15/2014  . Diabetes mellitus with stage 3 chronic kidney disease ([place]) 02/15/2014  . CHF (congestive heart failure) ([place]) 02/15/2014    . CKD (chronic kidney disease) 02/15/2014  . UTI (lower urinary tract infection) 02/15/2014    Past Surgical History:  Procedure Laterality Date  . ABDOMINAL HYSTERECTOMY    . APPENDECTOMY    . CAROTID ARTERY - SUBCLAVIAN ARTERY BYPASS GRAFT Right   . CARPAL TUNNEL RELEASE Right   . CARPAL TUNNEL RELEASE Left 12/03/2016   Procedure: CARPAL TUNNEL RELEASE;  Surgeon: Carole Civil, MD;  Location: [facility] ORS;  Service: Orthopedics;  Laterality: Left;  . CATARACT EXTRACTION Bilateral   . CHOLECYSTECTOMY    . COLONOSCOPY N/A 02/19/2014   Dr. Gala Romney: rectal varices, rectal polyp overlying a varix non-manipulated  . ESOPHAGEAL BANDING N/A 08/15/2018   Procedure: ESOPHAGEAL BANDING;  Surgeon: Daneil Dolin, MD;  Location: [facility] SU[facility];  Service: Endoscopy;  Laterality: N/A;  . ESOPHAGEAL BANDING N/A 09/15/2018   Procedure: ESOPHAGEAL BANDING;  Surgeon: Daneil Dolin, MD;  Location: [facility];  Service: Endoscopy;  Laterality: N/A;  . ESOPHAGOGASTRODUODENOSCOPY N/A 02/19/2014   Dr. Gala Romney: 4 columns of grade 2 esophageal varices, multiple gastric polyps with largest biopsied and hyperplastic. Negative H.pylori  . ESOPHAGOGASTRODUODENOSCOPY N/A 04/13/2018   Dr. Gala Romney: Grade 3 esophageal varices, no esophagitis, portal gastropathy, multiple hyperplastic appearing polyps  . ESOPHAGOGASTRODUODENOSCOPY (EGD) WITH PROPOFOL N/A 08/15/2018   Procedure: ESOPHAGOGASTRODUODENOSCOPY (EGD) WITH PROPOFOL;  Surgeon: Daneil Dolin, MD;  Location: [facility];  Service: Endoscopy;  Laterality: N/A;  1:45pm  . ESOPHAGOGASTRODUODENOSCOPY (EGD) WITH PROPOFOL N/A 09/15/2018   Dr. Gala Romney: Grade 2 esophageal varices, portal gastropathy, normal first and second duodenum, s/p banding X 5. Surveillance in 1 year  . GIVENS CAPSULE STUDY N/A 05/04/2014   multiple polypoid-like lesions throughout small bowel, scattered superficial non-bleeding erosions more distal bowel, referred to [facility] for enteroscopy. Capsule study  not complete, poor images.   Marland Kitchen TRIGGER FINGER RELEASE Left 12/03/2016   Procedure: RELEASE TRIGGER FINGER/A-1 PULLEY LEFT LONG TRIGGER FINGER RELEASE;  Surgeon: Carole Civil, MD;  Location: [facility];  Service: Orthopedics;  Laterality: Left;  . urosepsis       OB History    Gravida      Para      Term      Preterm      AB      Living  3     SAB      TAB      Ectopic      Multiple      Live Births              Family History  Problem Relation Age of Onset  . Diabetes Mother   . Hypertension Mother   . Kidney failure Mother   . Blindness Mother   . Aneurysm Father   . Colon cancer Neg Hx     Social History   Tobacco Use  . Smoking status: Former Smoker    Packs/day: 2.00    Years: 35.00    Pack years: 70.00    Quit date: 05/05/2003  Years since quitting: 16.3  . Smokeless tobacco: Never Used  . Tobacco comment: Quit smoking 11 years ago  Substance Use Topics  . Alcohol use: No  . Drug use: No    Home Medications Prior to Admission medications   Medication Sig Start Date End Date Taking? Authorizing Provider  acetaminophen (TYLENOL) 325 MG tablet Take 325 mg by mouth every 6 (six) hours as needed for mild pain or moderate pain.   Yes [provider]  amitriptyline (ELAVIL) 25 MG tablet Take 25 mg by mouth at bedtime.   Yes [provider]  amoxicillin-clavulanate (AUGMENTIN) 875-125 MG tablet Take 1 tablet by mouth 2 (two) times daily.   Yes [provider]  aspirin EC 81 MG tablet Take 1 tablet (81 mg total) by mouth daily with breakfast. 06/15/19  Yes Emokpae, Courage, MD  camphor-menthol (SARNA) lotion Apply 1 application topically 2 (two) times daily as needed for itching.   Yes [provider]  collagenase (SANTYL) ointment Apply 1 application topically daily as needed.   Yes [provider]  ferrous sulfate 325 (65 FE) MG tablet Take 1 tablet (325 mg total) by mouth daily. 07/05/19  Yes Emokpae,  Courage, MD  furosemide (LASIX) 80 MG tablet Take 80 mg by mouth daily.   Yes [provider]  gabapentin (NEURONTIN) 100 MG capsule Take 1 capsule (100 mg total) by mouth at bedtime. Patient taking differently: Take 100 mg by mouth daily.  07/04/19  Yes Emokpae, Courage, MD  insulin regular (NOVOLIN R) 100 units/mL injection Inject 24 Units into the skin 3 (three) times daily before meals.   Yes [provider]  lactulose (CHRONULAC) 10 GM/15ML solution Take 45 mLs (30 g total) by mouth 2 (two) times daily. Patient taking differently: Take 10 g by mouth 2 (two) times daily.  07/04/19  Yes Emokpae, Courage, MD  lansoprazole (PREVACID) 30 MG capsule TAKE (1) CAPSULE BY MOUTH TWICE DAILY BEFORE A MEAL Patient taking differently: Take 30 mg by mouth daily.  07/31/19  Yes Mahala Menghini, PA-C  levothyroxine (SYNTHROID) 100 MCG tablet TAKE 1 TABLET BY MOUTH ONCE DAILY. Patient taking differently: Take 100 mcg by mouth daily before breakfast.  06/01/19  Yes Nida, Marella Chimes, MD  ondansetron (ZOFRAN) 4 MG tablet Take 1 tablet (4 mg total) by mouth 4 (four) times daily -  before meals and at bedtime. Patient taking differently: Take 4 mg by mouth every 8 (eight) hours as needed. As needed 06/15/19  Yes Emokpae, Courage, MD  VICTOZA 18 MG/3ML SOPN INJECT 1.8MG SUBCUTANEOUSLY ONCE DAILY. Patient taking differently: Inject 1.8 mg into the skin daily.  08/29/19  Yes Nida, Marella Chimes, MD  XIFAXAN 550 MG TABS tablet Take 550 mg by mouth 2 (two) times daily. 08/18/19  Yes [provider]    Allergies    Erythromycin, Niacin, Penicillins, Sulfa antibiotics, Ciprofloxacin, Morphine and related, Statins, Codeine, Fluticasone, and Tramadol  Review of Systems   Review of Systems  Constitutional: Negative for chills and fever.  HENT: Negative for congestion, rhinorrhea and sore throat.   Eyes: Negative for visual disturbance.  Respiratory: Negative for cough and shortness of  breath.   Cardiovascular: Negative for chest pain and leg swelling.  Gastrointestinal: Positive for abdominal distention. Negative for abdominal pain, diarrhea, nausea and vomiting.  Genitourinary: Negative for dysuria.  Musculoskeletal: Negative for back pain and neck pain.  Skin: Negative for rash.  Neurological: Negative for dizziness, light-headedness and headaches.  Hematological: Does not  bruise/bleed easily.  Psychiatric/Behavioral: Negative for confusion.    Physical Exam Updated Vital Signs BP (!) 86/52   Pulse (!) 113   Temp 97.9 F (36.6 C) (Oral)   Resp 17   Ht 1.549 m (5' 1" )   Wt 82.1 kg   SpO2 94%   BMI 34.20 kg/m   Physical Exam Vitals and nursing note reviewed.  Constitutional:      General: She is not in acute distress.    Appearance: Normal appearance. She is well-developed.  HENT:     Head: Normocephalic and atraumatic.  Eyes:     Extraocular Movements: Extraocular movements intact.     Conjunctiva/sclera: Conjunctivae normal.     Pupils: Pupils are equal, round, and reactive to light.  Cardiovascular:     Rate and Rhythm: Normal rate and regular rhythm.     Heart sounds: No murmur.  Pulmonary:     Effort: Pulmonary effort is normal. No respiratory distress.     Breath sounds: Normal breath sounds.  Abdominal:     General: There is distension.     Palpations: Abdomen is soft.     Tenderness: There is no abdominal tenderness.     Comments: Abdomen distended but not hard.  Nontender.  Musculoskeletal:     Cervical back: Normal range of motion and neck supple.     Right lower leg: No edema.     Left lower leg: No edema.     Comments: No lower extremity swelling.  Skin:    General: Skin is warm and dry.     Capillary Refill: Capillary refill takes less than 2 seconds.     Coloration: Skin is not jaundiced.  Neurological:     General: No focal deficit present.     Mental Status: She is alert. Mental status is at baseline.     Cranial Nerves: No  cranial nerve deficit.     Sensory: No sensory deficit.     Comments: Patient alert no obvious focal neuro deficit.  May be some degree of confusion.     ED Results / Procedures / Treatments   Labs (all labs ordered are listed, but only abnormal results are displayed) Labs Reviewed  CBC WITH DIFFERENTIAL/PLATELET - Abnormal; Notable for the following components:      Result Value   RDW 17.4 (*)    Neutro Abs 8.0 (*)    Abs Immature Granulocytes 0.25 (*)    All other components within normal limits  COMPREHENSIVE METABOLIC PANEL - Abnormal; Notable for the following components:   Sodium 129 (*)    Potassium 6.6 (*)    Chloride 95 (*)    CO2 19 (*)    Glucose, Bld 213 (*)    BUN 72 (*)    Creatinine, Ser 2.05 (*)    Calcium 8.6 (*)    Total Protein 6.3 (*)    Albumin 2.2 (*)    GFR calc non Af Amer 24 (*)    GFR calc Af Amer 28 (*)    All other components within normal limits  AMMONIA - Abnormal; Notable for the following components:   Ammonia 109 (*)    All other components within normal limits  POTASSIUM - Abnormal; Notable for the following components:   Potassium 6.0 (*)    All other components within normal limits  CBG MONITORING, ED - Abnormal; Notable for the following components:   Glucose-Capillary 182 (*)    All other components within normal limits  MRSA PCR  SCREENING  SARS CORONAVIRUS 2 (TAT 6-24 HRS)  MRSA PCR SCREENING  RESPIRATORY PANEL BY RT PCR (FLU A&B, COVID)  BODY FLUID CULTURE  LIPASE, BLOOD  BRAIN NATRIURETIC PEPTIDE  URINALYSIS, ROUTINE W REFLEX MICROSCOPIC  HEMOGLOBIN A1C  BODY FLUID CELL COUNT WITH DIFFERENTIAL    EKG None   ED ECG REPORT   Date: 08/11/2019  Rate: 111  Rhythm: sinus tachycardia  QRS Axis: normal  Intervals: normal  ST/T Wave abnormalities: normal  Conduction Disutrbances:right bundle branch block  Narrative Interpretation:   Old EKG Reviewed: unchanged  I have personally reviewed the EKG tracing and agree with  the computerized printout as noted. No significant change in EKG from previous.  Does show sinus tachycardia at this time heart rate is 111 right bundle branch block anterior lateral infarct old.  Radiology DG Chest Port 1 View  Result Date: 08/30/2019 CLINICAL DATA:  Altered mental status.  Weakness. EXAM: PORTABLE CHEST 1 VIEW COMPARISON:  July 12, 2019 FINDINGS: There is airspace consolidation in the left lower lung region with small left pleural effusion. Lungs elsewhere are clear. Heart is upper normal in size with pulmonary vascularity normal. No adenopathy. No focal bone lesions. IMPRESSION: Airspace consolidation left lower lobe consistent with a degree of pneumonia. Small left pleural effusion. Lungs elsewhere clear. Stable cardiac silhouette. No adenopathy evident. Electronically Signed   By: Lowella Grip III M.D.   On: 08/15/2019 12:30    Procedures Procedures (including critical care time)  CRITICAL CARE Performed by: Fredia Sorrow Total critical care time: 30 minutes Critical care time was exclusive of separately billable procedures and treating other patients. Critical care was necessary to treat or prevent imminent or life-threatening deterioration. Critical care was time spent personally by me on the following activities: development of treatment plan with patient and/or surrogate as well as nursing, discussions with consultants, evaluation of patient's response to treatment, examination of patient, obtaining history from patient or surrogate, ordering and performing treatments and interventions, ordering and review of laboratory studies, ordering and review of radiographic studies, pulse oximetry and re-evaluation of patient's condition.   Medications Ordered in ED Medications  vancomycin (VANCOREADY) IVPB 1750 mg/350 mL (has no administration in time range)  sodium polystyrene (KAYEXALATE) 15 GM/60ML suspension 45 g (has no administration in time range)  calcium  gluconate 1 g/ 50 mL sodium chloride IVPB (has no administration in time range)  vancomycin (VANCOREADY) IVPB 750 mg/150 mL (has no administration in time range)  ceFEPIme (MAXIPIME) 2 g in sodium chloride 0.9 % 100 mL IVPB (has no administration in time range)  lactulose (CHRONULAC) 10 GM/15ML solution 30 g (has no administration in time range)  aspirin EC tablet 81 mg (has no administration in time range)  rifaximin (XIFAXAN) tablet 550 mg (has no administration in time range)  levothyroxine (SYNTHROID) tablet 100 mcg (has no administration in time range)  lactulose (CHRONULAC) 10 GM/15ML solution 30 g (has no administration in time range)  pantoprazole (PROTONIX) EC tablet 40 mg (has no administration in time range)  ferrous sulfate tablet 325 mg (has no administration in time range)  heparin injection 5,000 Units (has no administration in time range)  acetaminophen (TYLENOL) tablet 650 mg (has no administration in time range)    Or  acetaminophen (TYLENOL) suppository 650 mg (has no administration in time range)  ondansetron (ZOFRAN) tablet 4 mg (has no administration in time range)    Or  ondansetron (ZOFRAN) injection 4 mg (has no administration in time range)  insulin aspart (novoLOG) injection 0-15 Units (has no administration in time range)  insulin aspart (novoLOG) injection 0-5 Units (has no administration in time range)  ceFEPIme (MAXIPIME) 2 g in sodium chloride 0.9 % 100 mL IVPB (has no administration in time range)    ED Course  I have reviewed the triage vital signs and the nursing notes.  Pertinent labs & imaging results that were available during my care of the patient were reviewed by me and considered in my medical decision making (see chart for details).    MDM Rules/Calculators/A&P       Work-up consistent with a pneumonia and small pleural effusion.  Patient had admission in November so this could be hospital-acquired pneumonia.  So patient covered with  antibiotics for that broadly.  Patient has lots of allergies including a strong allergy to penicillin.  Also electrolytes showed a significant elevation in potassium but no real change in her renal function at all.  Possible of this could have been hemolysis.  Regular potassium sent off by itself.  But in the meantime patient given calcium gluconate and started on Kayexalate.  Covid testing for admission performed but clinically no real concern for Covid infection at this time.  Patient does not have to have emergent paracentesis at this time.  Hospitalist aware that she is part of hospice care.  Spoke with daughter.  Updated her on the reason for admission.                    Final Clinical Impression(s) / ED Diagnoses Final diagnoses:  Cirrhosis of liver with ascites, unspecified hepatic cirrhosis type ([place])  HCAP (healthcare-associated pneumonia)  Hyperkalemia  Cirrhosis of liver with ascites [facility])    Rx / DC Orders ED Discharge Orders    None       Fredia Sorrow, MD 08/09/2019 1554

## 2019-09-06 NOTE — H&P (Signed)
History and Physical  Penny Martinez XVQ:[id number] DOB: 03-24-1951 DOA: 09/05/2019   PCP: Vidal Schwalbe, MD   Patient coming from: Home  Chief Complaint: confusion and falling  HPI:  Penny Martinez is a 68 y.o. female with medical history of NASH liver cirrhosis, diabetes mellitus type 2, hyperlipidemia, COPD, hypothyroidism, cognitive impairment presenting initially for routine paracentesis.  After arrival, the patient was noted to be confused and sent to the emergency department.  In addition, the patient's daughter and husband wanted the patient to be evaluated as they noted the patient to be a little more confused and having more frequent falls with generalized weakness.  The patient herself is unable to provide any significant history secondary to her confusion.  There is been no vomiting, but the patient has been having some loose stools.  The patient also states that she has been having some shortness of breath, particularly when she lays flat.  She has had increasing abdominal distention.  She states that she normally has a paracentesis once weekly. In the emergency department, the patient was afebrile and hemodynamically stable with oxygen saturation 93-94% on room air.  Sodium was 129, potassium 6.6, serum creatinine 2.05.  WBC 10.0, hemoglobin 12.0, platelets 186,000.  EKG shows sinus rhythm with right bundle branch block.  Ammonia was 109.  Chest x-ray shows left lower lobe consolidation with small left pleural effusion.  Patient was started on IV antibiotics for lobar pneumonia.  Assessment/Plan: Lobar pneumonia/HCAP -Start vancomycin and cefepime -She has tolerated cephalosporins in the past  Hepatic encephalopathy -Presented with ammonia 109 -Start lactulose -Holding amitriptyline and gabapentin -Restart rifaximin  Hyperkalemia -Presented with potassium 6.0 -Give Kayexalate 45 g -Repeat BMP  Diabetes mellitus type 2, controlled -05/31/2019 hemoglobin A1c  7.0 -NovoLog sliding scale -Holding Victoza  Hypothyroidism -Continue Synthroid  CKD stage IV -Baseline creatinine 2.0-2.3 -A.m. BMP  Liver cirrhosis with ascites -Request paracentesis  Cognitive impairment -01/12/18 MOCA score 17/30 -continue aricept -follows neurology at [place] of care -Spouse states that patient is in hospice care/DNR but is okay with admission for antibiotics -Palliative medicine consult        Past Medical History:  Diagnosis Date  . Acid reflux   . Anemia   . Arthritis   . C. difficile colitis June/July 2015  . Cancer ([place])    skin cancer  . CHF (congestive heart failure) ([place])   . Cirrhosis ([place])    likely due to NASH. Negative viral markers 2015  . Complication of anesthesia   . COPD (chronic obstructive pulmonary disease) ([place])   . Diabetes mellitus without complication ([place])   . GERD (gastroesophageal reflux disease)   . History of kidney stones   . Hypercholesteremia   . Hypertension   . Hypothyroidism   . IBS (irritable bowel syndrome)   . Kidney disease   . Liver disease   . Neuropathy   . PONV (postoperative nausea and vomiting)   . Sleep apnea   . Thyroid disease    Past Surgical History:  Procedure Laterality Date  . ABDOMINAL HYSTERECTOMY    . APPENDECTOMY    . CAROTID ARTERY - SUBCLAVIAN ARTERY BYPASS GRAFT Right   . CARPAL TUNNEL RELEASE Right   . CARPAL TUNNEL RELEASE Left 12/03/2016   Procedure: CARPAL TUNNEL RELEASE;  Surgeon: Carole Civil, MD;  Location: [facility];  Service: Orthopedics;  Laterality: Left;  . CATARACT EXTRACTION Bilateral   . CHOLECYSTECTOMY    . COLONOSCOPY  N/A 02/19/2014   Dr. Gala Romney: rectal varices, rectal polyp overlying a varix non-manipulated  . ESOPHAGEAL BANDING N/A 08/15/2018   Procedure: ESOPHAGEAL BANDING;  Surgeon: Daneil Dolin, MD;  Location: [facility];  Service: Endoscopy;  Laterality: N/A;  . ESOPHAGEAL BANDING N/A 09/15/2018   Procedure: ESOPHAGEAL BANDING;   Surgeon: Daneil Dolin, MD;  Location: [facility];  Service: Endoscopy;  Laterality: N/A;  . ESOPHAGOGASTRODUODENOSCOPY N/A 02/19/2014   Dr. Gala Romney: 4 columns of grade 2 esophageal varices, multiple gastric polyps with largest biopsied and hyperplastic. Negative H.pylori  . ESOPHAGOGASTRODUODENOSCOPY N/A 04/13/2018   Dr. Gala Romney: Grade 3 esophageal varices, no esophagitis, portal gastropathy, multiple hyperplastic appearing polyps  . ESOPHAGOGASTRODUODENOSCOPY (EGD) WITH PROPOFOL N/A 08/15/2018   Procedure: ESOPHAGOGASTRODUODENOSCOPY (EGD) WITH PROPOFOL;  Surgeon: Daneil Dolin, MD;  Location: [facility];  Service: Endoscopy;  Laterality: N/A;  1:45pm  . ESOPHAGOGASTRODUODENOSCOPY (EGD) WITH PROPOFOL N/A 09/15/2018   Dr. Gala Romney: Grade 2 esophageal varices, portal gastropathy, normal first and second duodenum, s/p banding X 5. Surveillance in 1 year  . GIVENS CAPSULE STUDY N/A 05/04/2014   multiple polypoid-like lesions throughout small bowel, scattered superficial non-bleeding erosions more distal bowel, referred to [facility] for enteroscopy. Capsule study not complete, poor images.   Marland Kitchen TRIGGER FINGER RELEASE Left 12/03/2016   Procedure: RELEASE TRIGGER FINGER/A-1 PULLEY LEFT LONG TRIGGER FINGER RELEASE;  Surgeon: Carole Civil, MD;  Location: [facility];  Service: Orthopedics;  Laterality: Left;  . urosepsis     Social History:  reports that she quit smoking about 16 years ago. She has a 70.00 pack-year smoking history. She has never used smokeless tobacco. She reports that she does not drink alcohol or use drugs.   Family History  Problem Relation Age of Onset  . Diabetes Mother   . Hypertension Mother   . Kidney failure Mother   . Blindness Mother   . Aneurysm Father   . Colon cancer Neg Hx      Allergies  Allergen Reactions  . Erythromycin Hives  . Niacin Rash and Shortness Of Breath  . Penicillins Shortness Of Breath and Other (See Comments)    Has patient had a PCN reaction  causing immediate rash, facial/tongue/throat swelling, SOB or lightheadedness with hypotension: Yes Has patient had a PCN reaction causing severe rash involving mucus membranes or skin necrosis: No Has patient had a PCN reaction that required hospitalization: No Has patient had a PCN reaction occurring within the last 10 years: No If all of the above answers are "NO", then may proceed with Cephalosporin use. [place] with cephaloporins  . Sulfa Antibiotics Hives  . Ciprofloxacin Itching and Nausea And Vomiting    Stomach pain  . Morphine And Related   . Statins Other (See Comments)    Muscle Pain  . Codeine Itching  . Fluticasone Rash  . Tramadol Rash     Prior to Admission medications   Medication Sig Start Date End Date Taking? Authorizing Provider  acetaminophen (TYLENOL) 325 MG tablet Take 325 mg by mouth every 6 (six) hours as needed for mild pain or moderate pain.   Yes [provider]  amitriptyline (ELAVIL) 25 MG tablet Take 25 mg by mouth at bedtime.   Yes [provider]  amoxicillin-clavulanate (AUGMENTIN) 875-125 MG tablet Take 1 tablet by mouth 2 (two) times daily.   Yes [provider]  aspirin EC 81 MG tablet Take 1 tablet (81 mg total) by mouth daily with breakfast. 06/15/19  Yes  Roxan Hockey, MD  camphor-menthol [facility]) lotion Apply 1 application topically 2 (two) times daily as needed for itching.   Yes [provider]  collagenase (SANTYL) ointment Apply 1 application topically daily as needed.   Yes [provider]  ferrous sulfate 325 (65 FE) MG tablet Take 1 tablet (325 mg total) by mouth daily. 07/05/19  Yes Emokpae, Courage, MD  furosemide (LASIX) 80 MG tablet Take 80 mg by mouth daily.   Yes [provider]  gabapentin (NEURONTIN) 100 MG capsule Take 1 capsule (100 mg total) by mouth at bedtime. Patient taking differently: Take 100 mg by mouth daily.  07/04/19  Yes Emokpae, Courage, MD  insulin regular (NOVOLIN  R) 100 units/mL injection Inject 24 Units into the skin 3 (three) times daily before meals.   Yes [provider]  lactulose (CHRONULAC) 10 GM/15ML solution Take 45 mLs (30 g total) by mouth 2 (two) times daily. Patient taking differently: Take 10 g by mouth 2 (two) times daily.  07/04/19  Yes Emokpae, Courage, MD  lansoprazole (PREVACID) 30 MG capsule TAKE (1) CAPSULE BY MOUTH TWICE DAILY BEFORE A MEAL Patient taking differently: Take 30 mg by mouth daily.  07/31/19  Yes Mahala Menghini, PA-C  levothyroxine (SYNTHROID) 100 MCG tablet TAKE 1 TABLET BY MOUTH ONCE DAILY. Patient taking differently: Take 100 mcg by mouth daily before breakfast.  06/01/19  Yes Nida, Marella Chimes, MD  ondansetron (ZOFRAN) 4 MG tablet Take 1 tablet (4 mg total) by mouth 4 (four) times daily -  before meals and at bedtime. Patient taking differently: Take 4 mg by mouth every 8 (eight) hours as needed. As needed 06/15/19  Yes Emokpae, Courage, MD  VICTOZA 18 MG/3ML SOPN INJECT 1.8MG SUBCUTANEOUSLY ONCE DAILY. Patient taking differently: Inject 1.8 mg into the skin daily.  08/29/19  Yes Nida, Marella Chimes, MD  XIFAXAN 550 MG TABS tablet Take 550 mg by mouth 2 (two) times daily. 08/18/19  Yes [provider]    Review of Systems:  Limited secondary to patient's encephalopathy  Physical Exam: Vitals:   08/20/2019 1139 08/17/2019 1144  BP:  126/63  Pulse:  (!) 113  Resp:  17  Temp:  97.9 F (36.6 C)  TempSrc:  Oral  SpO2:  93%  Weight: 82.1 kg   Height: 5' 1"  (1.549 m)    General:  A&O x 1, NAD, nontoxic, pleasant/cooperative Head/Eye: No conjunctival hemorrhage, no icterus, [place]/AT, No nystagmus ENT:  No icterus,  No thrush, good dentition, no pharyngeal exudate Neck:  No masses, no lymphadenpathy, no bruits CV:  RRR, no rub, no gallop, no S3 Lung: Bibasilar rales.  Diminished breath sounds at left base. Abdomen: soft/NT, +BS, =distended, no peritoneal signs Ext: No cyanosis, No rashes, No  petechiae, No lymphangitis, No edema Neuro: CNII-XII intact, strength 4/5 in bilateral upper and lower extremities, no dysmetria  Labs on Admission:  Basic Metabolic Panel: Recent Labs  Lab 08/14/2019 1318 08/09/2019 1443  NA 129*  --   K 6.6* 6.0*  CL 95*  --   CO2 19*  --   GLUCOSE 213*  --   BUN 72*  --   CREATININE 2.05*  --   CALCIUM 8.6*  --    Liver Function Tests: Recent Labs  Lab 08/28/2019 1318  AST 37  ALT 13  ALKPHOS 126  BILITOT 1.2  PROT 6.3*  ALBUMIN 2.2*   Recent Labs  Lab 08/13/2019 1318  LIPASE 20   Recent Labs  Lab  08/10/2019 1442  AMMONIA 109*   CBC: Recent Labs  Lab 08/22/2019 1231  WBC 10.0  NEUTROABS 8.0*  HGB 12.0  HCT 38.6  MCV 86.7  PLT 186   Coagulation Profile: No results for input(s): INR, PROTIME in the last 168 hours. Cardiac Enzymes: No results for input(s): CKTOTAL, CKMB, CKMBINDEX, TROPONINI in the last 168 hours. BNP: Invalid input(s): POCBNP CBG: Recent Labs  Lab 08/20/2019 1143  GLUCAP 182*   Urine analysis:    Component Value Date/Time   COLORURINE YELLOW 07/03/2019 1520   APPEARANCEUR HAZY (A) 07/03/2019 1520   LABSPEC 1.012 07/03/2019 1520   PHURINE 5.0 07/03/2019 1520   GLUCOSEU NEGATIVE 07/03/2019 1520   HGBUR SMALL (A) 07/03/2019 1520   BILIRUBINUR NEGATIVE 07/03/2019 [place] 07/03/2019 1520   PROTEINUR NEGATIVE 07/03/2019 1520   UROBILINOGEN 0.2 03/05/2014 2140   NITRITE NEGATIVE 07/03/2019 1520   LEUKOCYTESUR SMALL (A) 07/03/2019 1520   Sepsis Labs: @LABRCNTIP (procalcitonin:4,lacticidven:4) )No results found for this or any previous visit (from the past 240 hour(s)).   Radiological Exams on Admission: DG Chest Port 1 View  Result Date: 08/21/2019 CLINICAL DATA:  Altered mental status.  Weakness. EXAM: PORTABLE CHEST 1 VIEW COMPARISON:  July 12, 2019 FINDINGS: There is airspace consolidation in the left lower lung region with small left pleural effusion. Lungs elsewhere are clear.  Heart is upper normal in size with pulmonary vascularity normal. No adenopathy. No focal bone lesions. IMPRESSION: Airspace consolidation left lower lobe consistent with a degree of pneumonia. Small left pleural effusion. Lungs elsewhere clear. Stable cardiac silhouette. No adenopathy evident. Electronically Signed   By: Lowella Grip III M.D.   On: 08/08/2019 12:30    EKG: Independently reviewed. Sinus rbbb    Time spent:60 minutes Code Status:   DNR Family Communication:  No Family at bedside Disposition Plan: expect 1-2 day hospitalization Consults called: palliative  DVT Prophylaxis: Heparin    Orson Eva, DO  [facility] Pager [phone]  If 7PM-7AM, please contact night-coverage [email or web address] Password [email or web address] 08/17/2019, 3:24 PM

## 2019-09-06 NOTE — ED Triage Notes (Signed)
Pt from [facility]. Originally, pt was just here to get paracentesis done at 12 pm. Per daughter, pt needs to be evaluated in ED. New onset of falls and not being able to walk for the last 4 days. CBG has been dropping at night. Is currently being treated for a UTI and pressure sore. Pt is in hospice care. Was seen by GI yesterday as well as hospice nurse. Daughter wants evaluated for drain placement for paracentesis at home.

## 2019-09-06 NOTE — Progress Notes (Addendum)
Pharmacy Antibiotic Note  Penny Martinez is a 68 y.o. female admitted on 08/09/2019 with pneumonia.  Pharmacy has been consulted for Vancomycin dosing.  Plan: Vancomycin 1750 mg IV x 1 dose. Vancomycin 750 mg IV every 24 hours.  Goal trough 15-20 mcg/mL.  Cefepime 2000 mg IV every 24 hours. Monitor labs, c/s, and vanco level as indicated  Height: 5' 1"  (154.9 cm) Weight: 181 lb (82.1 kg) IBW/kg (Calculated) : 47.8  Temp (24hrs), Avg:97.9 F (36.6 C), Min:97.9 F (36.6 C), Max:97.9 F (36.6 C)  Recent Labs  Lab 08/26/2019 1231 08/23/2019 1318  WBC 10.0  --   CREATININE  --  2.05*    Estimated Creatinine Clearance: 25.5 mL/min (A) (by C-G formula based on SCr of 2.05 mg/dL (H)).    Allergies  Allergen Reactions  . Erythromycin Hives  . Niacin Rash and Shortness Of Breath  . Penicillins Shortness Of Breath and Other (See Comments)    Has patient had a PCN reaction causing immediate rash, facial/tongue/throat swelling, SOB or lightheadedness with hypotension: Yes Has patient had a PCN reaction causing severe rash involving mucus membranes or skin necrosis: No Has patient had a PCN reaction that required hospitalization: No Has patient had a PCN reaction occurring within the last 10 years: No If all of the above answers are "NO", then may proceed with Cephalosporin use. [place] with cephaloporins  . Sulfa Antibiotics Hives  . Ciprofloxacin Itching and Nausea And Vomiting    Stomach pain  . Statins Other (See Comments)    Muscle Pain  . Codeine Itching  . Fluticasone Rash  . Tramadol Rash    Antimicrobials this admission: Vanco 12/30 >>  Cefepime 12/30 >>  Dose adjustments this admission: [place]  Microbiology results:  12/30 MRSA PCR: pending  Thank you for allowing pharmacy to be a part of this patient's care.  Ramond Craver 08/08/2019 2:33 PM

## 2019-09-06 NOTE — ED Notes (Signed)
Date and time results received: 09/04/2019 1405 (use smartphrase ".now" to insert current time)  Test: Potassium Critical Value: 6.6  Name of Provider Notified: Dr Rogene Houston  Orders Received? Or Actions Taken?: NA

## 2019-09-06 NOTE — ED Notes (Signed)
[place]-sister- [phone]

## 2019-09-06 NOTE — ED Notes (Signed)
Per sister, pt does not want to be admitted to hospital. Her wishes are to die at home. Once evaluation is over, pt wants to be transported home.

## 2019-09-06 NOTE — ED Notes (Signed)
Pt brought to er by ems.  EMS says pt has to have fluid drawn off her abd weekly and is scheduled to have the procedure today.  EMS reports pt's vitals were stable, cbg was 192.  Reported pt has had weakness and nausea since yesterday.  Pt says she did not want to be seen in the er.  Pt says she is here for her procedure in ultra sound.  Spoke with pt's husband on the phone to verify that pt is only here for the procedure and not the ER and he said she was sent for the procedure.  Spouse says pt not doing as well as usual.  Asked patient again if she would like to be evaluated in the ER and she stated, "no, i'll feel better after I get this fluid off of me."  Notified pt's husband and he is ok with pt not being seen in ed and taken over to [place].

## 2019-09-07 ENCOUNTER — Encounter (HOSPITAL_COMMUNITY): Payer: Self-pay | Admitting: Internal Medicine

## 2019-09-07 DIAGNOSIS — E875 Hyperkalemia: Secondary | ICD-10-CM | POA: Diagnosis present

## 2019-09-07 DIAGNOSIS — G4730 Sleep apnea, unspecified: Secondary | ICD-10-CM | POA: Diagnosis present

## 2019-09-07 DIAGNOSIS — J181 Lobar pneumonia, unspecified organism: Secondary | ICD-10-CM

## 2019-09-07 DIAGNOSIS — E038 Other specified hypothyroidism: Secondary | ICD-10-CM

## 2019-09-07 DIAGNOSIS — Z66 Do not resuscitate: Secondary | ICD-10-CM | POA: Diagnosis present

## 2019-09-07 DIAGNOSIS — K920 Hematemesis: Secondary | ICD-10-CM | POA: Diagnosis present

## 2019-09-07 DIAGNOSIS — Z515 Encounter for palliative care: Secondary | ICD-10-CM

## 2019-09-07 DIAGNOSIS — I8501 Esophageal varices with bleeding: Secondary | ICD-10-CM | POA: Diagnosis not present

## 2019-09-07 DIAGNOSIS — K7469 Other cirrhosis of liver: Secondary | ICD-10-CM

## 2019-09-07 LAB — COMPREHENSIVE METABOLIC PANEL
ALT: 13 U/L (ref 0–44)
AST: 30 U/L (ref 15–41)
Albumin: 2.1 g/dL — ABNORMAL LOW (ref 3.5–5.0)
Alkaline Phosphatase: 105 U/L (ref 38–126)
Anion gap: 17 — ABNORMAL HIGH (ref 5–15)
BUN: 79 mg/dL — ABNORMAL HIGH (ref 8–23)
CO2: 21 mmol/L — ABNORMAL LOW (ref 22–32)
Calcium: 8.8 mg/dL — ABNORMAL LOW (ref 8.9–10.3)
Chloride: 94 mmol/L — ABNORMAL LOW (ref 98–111)
Creatinine, Ser: 1.83 mg/dL — ABNORMAL HIGH (ref 0.44–1.00)
GFR calc Af Amer: 32 mL/min — ABNORMAL LOW (ref >60)
GFR calc non Af Amer: 28 mL/min — ABNORMAL LOW (ref >60)
Glucose, Bld: 275 mg/dL — ABNORMAL HIGH (ref 70–99)
Potassium: 5.3 mmol/L — ABNORMAL HIGH (ref 3.5–5.1)
Sodium: 132 mmol/L — ABNORMAL LOW (ref 135–145)
Total Bilirubin: 1.3 mg/dL — ABNORMAL HIGH (ref 0.3–1.2)
Total Protein: 5.9 g/dL — ABNORMAL LOW (ref 6.5–8.1)

## 2019-09-07 LAB — GLUCOSE, CAPILLARY
Glucose-Capillary: 254 mg/dL — ABNORMAL HIGH (ref 70–99)
Glucose-Capillary: 267 mg/dL — ABNORMAL HIGH (ref 70–99)

## 2019-09-07 LAB — PATHOLOGIST SMEAR REVIEW

## 2019-09-07 LAB — CBC
HCT: 37.4 % (ref 36.0–46.0)
Hemoglobin: 11.9 g/dL — ABNORMAL LOW (ref 12.0–15.0)
MCH: 27.4 pg (ref 26.0–34.0)
MCHC: 31.8 g/dL (ref 30.0–36.0)
MCV: 86.2 fL (ref 80.0–100.0)
Platelets: 169 10*3/uL (ref 150–400)
RBC: 4.34 MIL/uL (ref 3.87–5.11)
RDW: 17.5 % — ABNORMAL HIGH (ref 11.5–15.5)
WBC: 6.3 10*3/uL (ref 4.0–10.5)
nRBC: 0 % (ref 0.0–0.2)

## 2019-09-07 LAB — HEMOGLOBIN A1C
Hgb A1c MFr Bld: 7.8 % — ABNORMAL HIGH (ref 4.8–5.6)
Mean Plasma Glucose: 177.16 mg/dL

## 2019-09-07 LAB — AMMONIA: Ammonia: 104 umol/L — ABNORMAL HIGH (ref 9–35)

## 2019-09-07 MED ORDER — SODIUM CHLORIDE 0.9% FLUSH
3.0000 mL | Freq: Two times a day (BID) | INTRAVENOUS | Status: DC
  Administered 2019-09-07: 3 mL via INTRAVENOUS

## 2019-09-07 MED ORDER — INSULIN GLARGINE 100 UNIT/ML ~~LOC~~ SOLN
15.0000 [IU] | Freq: Every day | SUBCUTANEOUS | Status: DC
  Filled 2019-09-07 (×5): qty 0.15

## 2019-09-07 MED ORDER — ONDANSETRON HCL 4 MG/2ML IJ SOLN
4.0000 mg | Freq: Four times a day (QID) | INTRAMUSCULAR | Status: DC | PRN
  Administered 2019-09-08: 4 mg via INTRAVENOUS
  Filled 2019-09-07 (×2): qty 2

## 2019-09-07 MED ORDER — DIPHENHYDRAMINE HCL 50 MG/ML IJ SOLN: 12.5000 mg | INTRAMUSCULAR | Status: DC | PRN

## 2019-09-07 MED ORDER — GLYCOPYRROLATE 1 MG PO TABS: 1.0000 mg | ORAL_TABLET | ORAL | Status: DC | PRN

## 2019-09-07 MED ORDER — LACTULOSE 10 GM/15ML PO SOLN
30.0000 g | Freq: Three times a day (TID) | ORAL | Status: DC
  Administered 2019-09-07: 30 g via ORAL
  Filled 2019-09-07: qty 60

## 2019-09-07 MED ORDER — OCTREOTIDE ACETATE 100 MCG/ML IJ SOLN
50.0000 ug | Freq: Once | INTRAMUSCULAR | Status: AC
  Administered 2019-09-07: 50 ug via SUBCUTANEOUS
  Filled 2019-09-07: qty 0.5
  Filled 2019-09-07: qty 1

## 2019-09-07 MED ORDER — CEFDINIR 300 MG PO CAPS: 300.0000 mg | ORAL_CAPSULE | Freq: Two times a day (BID) | ORAL | Status: DC

## 2019-09-07 MED ORDER — GLYCOPYRROLATE 0.2 MG/ML IJ SOLN: 0.2000 mg | INTRAMUSCULAR | Status: DC | PRN

## 2019-09-07 MED ORDER — CEFDINIR 300 MG PO CAPS: 300.0000 mg | ORAL_CAPSULE | ORAL | Status: DC

## 2019-09-07 MED ORDER — POLYVINYL ALCOHOL 1.4 % OP SOLN: 1.0000 [drp] | Freq: Four times a day (QID) | OPHTHALMIC | Status: DC | PRN

## 2019-09-07 MED ORDER — LORAZEPAM 1 MG PO TABS: 1.0000 mg | ORAL_TABLET | ORAL | Status: DC | PRN

## 2019-09-07 MED ORDER — PROMETHAZINE HCL 25 MG/ML IJ SOLN
12.5000 mg | Freq: Four times a day (QID) | INTRAMUSCULAR | Status: DC | PRN
  Administered 2019-09-07: 16:00:00 12.5 mg via INTRAVENOUS
  Filled 2019-09-07: qty 1

## 2019-09-07 MED ORDER — LORAZEPAM 2 MG/ML IJ SOLN
1.0000 mg | INTRAMUSCULAR | Status: DC | PRN
  Administered 2019-09-07: 1 mg via INTRAVENOUS
  Filled 2019-09-07: qty 1

## 2019-09-07 MED ORDER — FENTANYL CITRATE (PF) 100 MCG/2ML IJ SOLN
12.5000 ug | INTRAMUSCULAR | Status: DC | PRN
  Filled 2019-09-07: qty 2

## 2019-09-07 MED ORDER — FENTANYL CITRATE (PF) 100 MCG/2ML IJ SOLN
25.0000 ug | INTRAMUSCULAR | Status: DC | PRN
  Administered 2019-09-07: 25 ug via INTRAVENOUS
  Filled 2019-09-07 (×2): qty 2

## 2019-09-07 MED ORDER — PANTOPRAZOLE SODIUM 40 MG IV SOLR
40.0000 mg | Freq: Two times a day (BID) | INTRAVENOUS | Status: DC
  Administered 2019-09-07: 40 mg via INTRAVENOUS
  Filled 2019-09-07 (×2): qty 40

## 2019-09-07 MED ORDER — ONDANSETRON 4 MG PO TBDP: 4.0000 mg | ORAL_TABLET | Freq: Four times a day (QID) | ORAL | Status: DC | PRN

## 2019-09-07 MED ORDER — INSULIN ASPART 100 UNIT/ML ~~LOC~~ SOLN: 8.0000 [IU] | Freq: Three times a day (TID) | SUBCUTANEOUS | Status: DC

## 2019-09-07 MED ORDER — LEVOTHYROXINE SODIUM 100 MCG PO TABS
100.0000 ug | ORAL_TABLET | Freq: Every day | ORAL | Status: DC
  Administered 2019-09-07: 100 ug via ORAL
  Filled 2019-09-07: qty 1

## 2019-09-07 MED ORDER — DOXYCYCLINE HYCLATE 100 MG PO TABS: 100.0000 mg | ORAL_TABLET | Freq: Two times a day (BID) | ORAL | Status: DC

## 2019-09-07 MED ORDER — SODIUM POLYSTYRENE SULFONATE 15 GM/60ML PO SUSP
30.0000 g | Freq: Once | ORAL | Status: DC
  Filled 2019-09-07: qty 120

## 2019-09-07 NOTE — Plan of Care (Signed)
  Problem: Education: Goal: Knowledge of General Education information will improve Description Including pain rating scale, medication(s)/side effects and non-pharmacologic comfort measures Outcome: Progressing   Problem: Health Behavior/Discharge Planning: Goal: Ability to manage health-related needs will improve Outcome: Progressing   

## 2019-09-07 NOTE — Significant Event (Addendum)
09/07/2019 1:44 PM  Pt vomited blood x 1 and had a bloody stool, will check CBC now, vitals have remained stable.  I spoke with husband and he wants a palliative approach to her care and plans to take home with hospice and because of that I had cancelled the GI consult.  If she rebleeds will speak with family about if they want to do a GI consult and start further treatments.  After discussion with husband today I feel he would not want further procedures and to focus on comfort and dignity.  He talked to me about the rapid progression of patient's decline and understands that patient is terminally ill with incurable disease.   He did not want to rescind hospice status.  Murvin Natal MD

## 2019-09-07 NOTE — Progress Notes (Signed)
09/07/2019 Chaplain received referral from nurse Santa Lighter that Physician learned that family would like Chaplain visit due to patient being on comfort measures and actively dying. [place] in hospital bed with eyes closed and very comfortable without signs or symptoms of pain or discomfort. She had her husband and three children, two boys and a girl, at her bedside. They shared openly today about her upbringing, her relationships with them and grandchildren including her spiritual heritage in the [employer] and being a very active [place] lady. Chaplain provided active listening, support, opportunity for reflection, and affirmation of faith. Ended visit with prayer. Chaplain will remain available as needed or requested in order to provide spiritual support and to assess for continued spiritual need as patient declines. No further visits planned at this time.

## 2019-09-07 NOTE — Progress Notes (Addendum)
PROGRESS NOTE [facility]   Penny Martinez  PFX:[id number]  DOB: May 28, 1951  DOA: 08/20/2019 PCP: Vidal Schwalbe, MD   Brief Admission Hx: 68 year old female with end-stage liver disease due to [name], diabetes mellitus type 2, hypothyroidism, COPD, cognitive impairment who was on home hospice care was sent to the emergency department due to confusion and more frequent falls and found to be in hepatic encephalopathy and also have a lobar pneumonia.  Patient has been on home hospice however family decided they wanted to treat the pneumonia.  MDM/Assessment & Plan:   1. Left lower lobe pneumonia-patient was started on empiric broad-spectrum coverage with good results and now plan to de-escalate antibiotics today.  Continue supportive therapy. 2. 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  Her ammonia levels were markedly elevated and have not improved overnight.  I have increased her lactulose dosing.  So asked for palliative medicine consult as I fear that patient is in the end stages of her disease and would benefit greatly from full comfort care.  Awaiting palliative care consultation and recommendations.  Patient is DNR. 3. DNR on admission as patient was on home hospice care and likely will resume at discharge versus residential hospice placement. 4. Hyperkalemia-improved but not corrected I have ordered an additional dose of Kayexalate to be given this morning.  Repeat BMP in a.m. if not on full comfort care measures. 5. Severe ascites -patient unfortunately requires weekly paracentesis procedures.  She had 1 done yesterday where more than 5 L of fluid was removed.  The fluid was sent for routine tests.  6. Poorly controlled type 2 diabetes mellitus-she is having severe hyperglycemia I have added basal insulin and prandial coverage in addition to sliding scale coverage and CBG testing.  We will continue  to follow and titrate as needed. 7. Stage IV CKD-creatinine remains at baseline continuing to follow. 30. Dementia-patient is currently encephalopathic due to end-stage liver disease which likely is exacerbating her cognitive impairment.  Follow.  DVT prophylaxis: Heparin Code Status: DNR Family Communication: Telephone update Disposition Plan: Pending palliative discussions likely return home with hospice services versus residential hospice placement  Update:  After speaking with husband on phone, he prefers to take patient home when discharged. Says patient wants to die at home and not in a hospital or hospice facility.  He has several people to assist at home including a caretaker (who is currently on vacation but will be back in a couple of days) and should be ready to take her back in next couple of days.    Consultants:  Palliative medicine  Procedures:  Paracentesis performed 08/16/2019 with 5 L removed  Antimicrobials:  Cefepime 12/30 >  Vancomycin 12/30 >  Subjective: Patient remains encephalopathic but does not appear to be in acute distress  Objective: Vitals:   09/07/19 0030 09/07/19 0103 09/07/19 0326 09/07/19 0455  BP:  (!) 90/52 (!) 128/54 (!) 132/54  Pulse: (!) 102 (!) 103 (!) 101 (!) 104  Resp: 17   18  Temp:  98 F (36.7 C)  (!) 97.4 F (36.3 C)  TempSrc:  Oral  Oral  SpO2: 97% 97%  98%  Weight:   86.6 kg   Height:   5' 1"  (1.549 m)     Intake/Output Summary (Last 24 hours) at 09/07/2019 1221 Last data filed at 09/07/2019 0320 Gross per 24 hour  Intake 480.13 ml  Output --  Net 480.13 ml  Filed Weights   08/18/2019 1139 09/07/19 0326  Weight: 82.1 kg 86.6 kg     REVIEW OF SYSTEMS  Unable to obtain due to condition  Exam:  General exam: Elderly chronically ill-appearing female with a large abdomen consistent with end stages of liver disease, encephalopathic but appears comfortable and in no apparent distress Respiratory system:  No  increased work of breathing. Cardiovascular system: Tachycardic rate S1 & S2 heard. Gastrointestinal system: Abdomen is nondistended, soft and nontender. Normal bowel sounds heard. Central nervous system: Alert and encephalopathic. No focal neurological deficits. Extremities: Trace pretibial edema bilateral lower extremities.  Data Reviewed: Basic Metabolic Panel: Recent Labs  Lab 08/13/2019 1318 08/28/2019 1443 09/07/19 0539  NA 129*  --  132*  K 6.6* 6.0* 5.3*  CL 95*  --  94*  CO2 19*  --  21*  GLUCOSE 213*  --  275*  BUN 72*  --  79*  CREATININE 2.05*  --  1.83*  CALCIUM 8.6*  --  8.8*   Liver Function Tests: Recent Labs  Lab 08/18/2019 1318 09/07/19 0539  AST 37 30  ALT 13 13  ALKPHOS 126 105  BILITOT 1.2 1.3*  PROT 6.3* 5.9*  ALBUMIN 2.2* 2.1*   Recent Labs  Lab 08/13/2019 1318  LIPASE 20   Recent Labs  Lab 09/04/2019 1442 09/07/19 0539  AMMONIA 109* 104*   CBC: Recent Labs  Lab 08/17/2019 1231 09/07/19 0855  WBC 10.0 6.3  NEUTROABS 8.0*  --   HGB 12.0 11.9*  HCT 38.6 37.4  MCV 86.7 86.2  PLT 186 169   Cardiac Enzymes: No results for input(s): CKTOTAL, CKMB, CKMBINDEX, TROPONINI in the last 168 hours. CBG (last 3)  Recent Labs    08/30/2019 2129 09/07/19 0747 09/07/19 1147  GLUCAP 238* 254* 267*   Recent Results (from the past 240 hour(s))  Culture, body fluid-bottle     Status: None (Preliminary result)   Collection Time: 08/13/2019  3:42 PM   Specimen: Ascitic  Result Value Ref Range Status   Specimen Description ASCITIC  Final   Special Requests 10CC  Final   Culture   Final    NO GROWTH < 24 HOURS Performed at [facility], [address], [place], [place] [address]    Report Status PENDING  Incomplete  Gram stain     Status: None   Collection Time: 08/25/2019  3:42 PM   Specimen: Ascitic  Result Value Ref Range Status   Specimen Description ASCITIC  Final   Special Requests NONE  Final   Gram Stain   Final    NO ORGANISMS SEEN CYTOSPIN  SMEAR WBC PRESENT, PREDOMINANTLY MONONUCLEAR Performed at [facility], [address] [place], [place] [address]    Report Status 09/04/2019 FINAL  Final  Respiratory Panel by RT PCR (Flu A&B, Covid) - Nasopharyngeal Swab     Status: None   Collection Time: 09/05/2019  4:11 PM   Specimen: Nasopharyngeal Swab  Result Value Ref Range Status   SARS Coronavirus 2 by RT PCR NEGATIVE NEGATIVE Final    Comment: (NOTE) SARS-CoV-2 target nucleic acids are NOT DETECTED. The SARS-CoV-2 RNA is generally detectable in upper respiratoy specimens during the acute phase of infection. The lowest concentration of SARS-CoV-2 viral copies this assay can detect is 131 copies/mL. A negative result does not preclude SARS-Cov-2 infection and should not be used as the sole basis for treatment or other patient management decisions. A negative result may occur with  improper specimen collection/handling,  submission of specimen other than nasopharyngeal swab, presence of viral mutation(s) within the areas targeted by this assay, and inadequate number of viral copies (<131 copies/mL). A negative result must be combined with clinical observations, patient history, and epidemiological information. The expected result is Negative. Fact Sheet for Patients:  PinkCheek.be Fact Sheet for Healthcare Providers:  GravelBags.it This test is not yet ap proved or cleared by the [place] FDA and  has been authorized for detection and/or diagnosis of SARS-CoV-2 by FDA under an Emergency Use Authorization (EUA). This EUA will remain  in effect (meaning this test can be used) for the duration of the COVID-19 declaration under Section 564(b)(1) of the Act, 21 U.S.C. section 360bbb-3(b)(1), unless the authorization is terminated or revoked sooner.    Influenza A by PCR NEGATIVE NEGATIVE Final   Influenza B by PCR NEGATIVE NEGATIVE Final    Comment: (NOTE) The Xpert  Xpress SARS-CoV-2/FLU/RSV assay is intended as an aid in  the diagnosis of influenza from Nasopharyngeal swab specimens and  should not be used as a sole basis for treatment. Nasal washings and  aspirates are unacceptable for Xpert Xpress SARS-CoV-2/FLU/RSV  testing. Fact Sheet for Patients: PinkCheek.be Fact Sheet for Healthcare Providers: GravelBags.it This test is not yet approved or cleared by the [place] FDA and  has been authorized for detection and/or diagnosis of SARS-CoV-2 by  FDA under an Emergency Use Authorization (EUA). This EUA will remain  in effect (meaning this test can be used) for the duration of the  Covid-19 declaration under Section 564(b)(1) of the Act, 21  U.S.C. section 360bbb-3(b)(1), unless the authorization is  terminated or revoked. Performed at [facility], [address] [place], [place] [address]      Studies: US Paracentesis  Result Date: 08/16/2019 INDICATION: Ascites EXAM: ULTRASOUND GUIDED  PARACENTESIS MEDICATIONS: None. COMPLICATIONS: None immediate. PROCEDURE: Informed written consent was obtained from the patient after a discussion of the risks, benefits and alternatives to treatment. A timeout was performed prior to the initiation of the procedure. Initial ultrasound scanning demonstrates a large amount of ascites within the right lower abdominal quadrant. The right lower abdomen was prepped and draped in the usual sterile fashion. 1% lidocaine was used for local anesthesia. Following this, a 19 gauge, 7-cm, Yueh catheter was introduced. An ultrasound image was saved for documentation purposes. The paracentesis was performed. The catheter was removed and a dressing was applied. The patient tolerated the procedure well without immediate post procedural complication. FINDINGS: A total of approximately 5.2 liters of cloudy yellow fluid was removed. Samples were sent to the laboratory as  requested by the clinical team. IMPRESSION: Successful ultrasound-guided paracentesis yielding 5.2 liters of peritoneal fluid. Electronically Signed   By: Rolm Baptise M.D.   On: 08/19/2019 17:53   DG Chest Port 1 View  Result Date: 08/12/2019 CLINICAL DATA:  Altered mental status.  Weakness. EXAM: PORTABLE CHEST 1 VIEW COMPARISON:  July 12, 2019 FINDINGS: There is airspace consolidation in the left lower lung region with small left pleural effusion. Lungs elsewhere are clear. Heart is upper normal in size with pulmonary vascularity normal. No adenopathy. No focal bone lesions. IMPRESSION: Airspace consolidation left lower lobe consistent with a degree of pneumonia. Small left pleural effusion. Lungs elsewhere clear. Stable cardiac silhouette. No adenopathy evident. Electronically Signed   By: Lowella Grip III M.D.   On: 09/02/2019 12:30   Scheduled Meds: . aspirin EC  81 mg Oral Q breakfast  . ferrous sulfate  325  mg Oral Daily  . heparin  5,000 Units Subcutaneous Q8H  . insulin aspart  0-15 Units Subcutaneous TID WC  . insulin aspart  0-5 Units Subcutaneous QHS  . insulin aspart  8 Units Subcutaneous TID WC  . insulin glargine  15 Units Subcutaneous Daily  . lactulose  30 g Oral TID  . levothyroxine  100 mcg Oral Q0600  . pantoprazole  40 mg Oral Daily  . rifaximin  550 mg Oral BID  . sodium polystyrene  30 g Oral Once   Continuous Infusions: . ceFEPime (MAXIPIME) IV    . vancomycin      Active Problems:   Hepatic cirrhosis (HCC)   Other specified hypothyroidism   HCAP (healthcare-associated pneumonia)   Acute hepatic encephalopathy   Lobar pneumonia (HCC)   CKD (chronic kidney disease) stage 4, GFR 15-29 ml/min ([place])  Time spent:   Irwin Brakeman, MD [facility] 09/07/2019, 12:21 PM    LOS: 1 day  How to contact the [facility] Attending or Consulting provider [place] or covering provider during after hours [place], for this patient?  1. Check the care team in [facility]  and look for a) attending/consulting TR[facility] provider listed and b) the [facility] team listed 2. Log into [email or web address] and use [place]'s universal password to access. If you do not have the password, please contact the hospital operator. 3. Locate the [facility] provider you are looking for under [facility] and page to a number that you can be directly reached. 4. If you still have difficulty reaching the provider, please page the [facility] (Director on Call) for the Hospitalists listed on amion for assistance.

## 2019-09-07 NOTE — Significant Event (Addendum)
09/07/2019 4:38 PM  Pt had another episode of hematemesis. Daughter was at bedside.  I came to assess and found patient vomited about 50 cc bright red blood. Confirmed again with family that we are continuing full comfort care. Son arrived and updated him.  Husband on the way, he is about 45 mins away. Daughter says patient having pain from sacral ulcer pain and would like something to help with the pain and discomfort.  Fentanyl IV and phenergan IV ordered. Octreotide 50 mcg x 1 IV ordered.  Full comfort care orders started. Please see orders.  Anticipating hospital death.   Murvin Natal MD

## 2019-09-07 NOTE — Progress Notes (Signed)
CC'ED TO PCP 

## 2019-09-07 NOTE — Care Management Important Message (Signed)
Important Message  Patient Details  Name: Penny Martinez MRN: [id number] Date of Birth: 1950/11/25   Medicare Important Message Given:  Yes(RN will deliver to patient due to contact precautions)     Tommy Medal 09/07/2019, 12:18 PM

## 2019-09-08 DIAGNOSIS — D696 Thrombocytopenia, unspecified: Secondary | ICD-10-CM

## 2019-09-08 DIAGNOSIS — Z66 Do not resuscitate: Secondary | ICD-10-CM

## 2019-09-08 DIAGNOSIS — Z515 Encounter for palliative care: Secondary | ICD-10-CM

## 2019-09-08 DIAGNOSIS — E875 Hyperkalemia: Secondary | ICD-10-CM

## 2019-09-08 DIAGNOSIS — E7220 Disorder of urea cycle metabolism, unspecified: Secondary | ICD-10-CM

## 2019-09-08 DEATH — deceased

## 2019-09-11 LAB — CULTURE, BODY FLUID W GRAM STAIN -BOTTLE: Culture: NO GROWTH

## 2019-09-12 ENCOUNTER — Other Ambulatory Visit (HOSPITAL_COMMUNITY): Payer: Medicare Other

## 2019-09-19 ENCOUNTER — Ambulatory Visit (HOSPITAL_COMMUNITY): Payer: Medicare Other | Admitting: Nurse Practitioner

## 2019-10-03 ENCOUNTER — Ambulatory Visit: Payer: Medicare Other | Admitting: Gastroenterology

## 2019-10-05 ENCOUNTER — Ambulatory Visit: Payer: Medicare Other | Admitting: "Endocrinology

## 2019-10-09 NOTE — Progress Notes (Signed)
Dr. Scherrie November notified of patient's death at [phone].  Daughter and son at bedside.

## 2019-10-09 NOTE — Death Summary Note (Signed)
DEATH SUMMARY   Patient Details  Name: Penny Martinez MRN: [id number] DOB: 01-05-1951  Admission/Discharge Information   Admit Date:  Sep 22, 2019  Date of Death: Date of Death: 09-24-2019  Time of Death: Time of Death: 0618  Length of Stay: 2  Referring Physician: [name], MD   Reason(s) for Hospitalization  Pt presented with worsening encephalopathy from home had been on home hospice care.   Diagnoses  Preliminary cause of death:  Secondary Diagnoses (including complications and co-morbidities):  Principal Problem:   Acute hepatic encephalopathy Active Problems:   Diabetes mellitus with stage 3 chronic kidney disease (HCC)   CHF (congestive heart failure) (HCC)   Cirrhosis of liver with ascites (HCC)   Morbid obesity with BMI of 40.0-44.9, adult (HCC)   Other specified hypothyroidism   Thrombocytopenia (HCC)   Uncontrolled type 2 diabetes mellitus with complication (HCC)   Hyponatremia   Hyperammonemia (HCC)   Left lower lobe pulmonary infiltrate   HCAP (healthcare-associated pneumonia)   Lobar pneumonia (HCC)   CKD (chronic kidney disease) stage 4, GFR 15-29 ml/min (HCC)   DNR (do not resuscitate)   Hyperkalemia   Sleep apnea   Hematemesis   Esophageal varices with bleeding (HCC)   Comfort measures only status   Brief Hospital Course (including significant findings, care, treatment, and services provided and events leading to death)  [name] is a 69 y.o. year old female  Brief Admission Hx: 69 year old female with end-stage liver disease due to Penny Martinez, diabetes mellitus type 2, hypothyroidism, COPD, cognitive impairment who was on home hospice care was sent to the emergency department due to confusion and more frequent falls and found to be in hepatic encephalopathy and also have a lobar pneumonia.  Patient has been on home hospice however family decided they wanted to treat the pneumonia.  MDM/Assessment & Plan:   1. Left lower lobe  pneumonia-patient was started on empiric broad-spectrum coverage and supportive therapy.  2. 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  Her ammonia levels were markedly elevated and have not improved overnight.  I have increased her lactulose dosing.  So asked for palliative medicine consult as I fear that patient is in the end stages of her disease and would benefit greatly from full comfort care.   Patient is DNR.  Spoke with family and decision was made to pursue full comfort care. 3. Hematemesis - from esophageal varices..the patient is full comfort care and treated for comfort and dignity.  4. DNR on admission as patient was on home hospice care and was transitioned to full comfort care. 5. Hyperkalemia-treated with kayexalate.  6. Severe ascites -patient unfortunately requires weekly paracentesis procedures.  She had 1 done 22-Sep-2023 where more than 5 L of fluid was removed.  The fluid was sent for routine tests.  7. Poorly controlled type 2 diabetes mellitus-full comfort care.  8. Stage IV CKD-creatinine remains at baseline continuing to follow. 9. Dementia-patient is currently encephalopathic due to end-stage liver disease which likely is exacerbating her cognitive impairment.  Follow.  Code Status: DNR Family Communication: Telephone and bedside  Pertinent Labs and Studies  Significant Diagnostic Studies US Paracentesis  Result Date: 22-Sep-2019 INDICATION: Ascites EXAM: ULTRASOUND GUIDED  PARACENTESIS MEDICATIONS: None. COMPLICATIONS: None immediate. PROCEDURE: Informed written consent was obtained from the patient after a discussion of the risks, benefits and alternatives to treatment. A timeout was performed prior to the initiation of the procedure. Initial ultrasound  scanning demonstrates a large amount of ascites within the right lower abdominal quadrant. The right lower abdomen was prepped and draped in  the usual sterile fashion. 1% lidocaine was used for local anesthesia. Following this, a 19 gauge, 7-cm, Yueh catheter was introduced. An ultrasound image was saved for documentation purposes. The paracentesis was performed. The catheter was removed and a dressing was applied. The patient tolerated the procedure well without immediate post procedural complication. FINDINGS: A total of approximately 5.2 liters of cloudy yellow fluid was removed. Samples were sent to the laboratory as requested by the clinical team. IMPRESSION: Successful ultrasound-guided paracentesis yielding 5.2 liters of peritoneal fluid. Electronically Signed   By: Charlett Nose M.D.   On: 10/03/2019 17:53   US Paracentesis  Result Date: 08/30/2019 INDICATION: Cirrhosis, ascites EXAM: ULTRASOUND GUIDED DIAGNOSTIC AND THERAPEUTIC PARACENTESIS MEDICATIONS: None COMPLICATIONS: None immediate PROCEDURE: Informed written consent was obtained from the patient after a discussion of the risks, benefits and alternatives to treatment. A timeout was performed prior to the initiation of the procedure. Initial ultrasound scanning demonstrates a large amount of ascites within the LEFT lower abdominal quadrant. The right lower abdomen was prepped and draped in the usual sterile fashion. 1% lidocaine was used for local anesthesia. Following this, a 5 [place] Yueh catheter was introduced. An ultrasound image was saved for documentation purposes. The paracentesis was performed. The catheter was removed and a dressing was applied. The patient tolerated the procedure well without immediate post procedural complication. Patient received post-procedure intravenous albumin; see nursing notes for details. FINDINGS: A total of approximately 5.2 L of milky yellow ascitic fluid was removed. Samples were sent to the laboratory as requested by the clinical team. IMPRESSION: Successful ultrasound-guided paracentesis yielding 5.2 liters of peritoneal fluid. Electronically  Signed   By: Ulyses Southward M.D.   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 A timeout was performed prior to the initiation of the procedure. Initial ultrasound scanning demonstrates a large amount of ascites within the LEFT lower abdominal quadrant. The right lower abdomen was prepped and draped in the usual sterile fashion. 1% lidocaine was used for local anesthesia. Following this, a 5 [place] Yueh catheter was introduced. An ultrasound image was saved for documentation purposes. The paracentesis was performed. The catheter was removed and a dressing was applied. The patient tolerated the procedure well without immediate post procedural complication. Patient received post-procedure intravenous albumin; see nursing notes for details. FINDINGS: A total of approximately 5 L of slightly cloudy yellow ascitic fluid was removed. Samples were sent to the laboratory as requested by the clinical team. IMPRESSION: Successful ultrasound-guided paracentesis yielding 5 liters of peritoneal fluid. Electronically Signed   By: Ulyses Southward M.D.   On: 08/22/2019 12:41   US Paracentesis  Result Date: 08/10/2019 INDICATION: Cirrhosis, ascites EXAM: ULTRASOUND GUIDED DIAGNOSTIC AND THERAPEUTIC PARACENTESIS MEDICATIONS: None COMPLICATIONS: None immediate PROCEDURE: Informed written consent was obtained from the patient after a discussion of the risks, benefits and alternatives to treatment. A timeout was performed prior to the initiation of the procedure. Initial ultrasound scanning demonstrates a large amount of ascites within the LEFT lower abdominal quadrant. The right lower abdomen was prepped and draped in  the usual sterile fashion. 1% lidocaine was used for local  anesthesia. Following this, a 5 [place] Yueh catheter was introduced. An ultrasound image was saved for documentation purposes. The paracentesis was performed. The catheter was removed and a dressing was applied. The patient tolerated the procedure well without immediate post procedural complication. Patient received post-procedure intravenous albumin; see nursing notes for details. FINDINGS: A total of approximately 5.1 L of milky light yellow ascitic fluid was removed. Samples were sent to the laboratory as requested by the clinical team. IMPRESSION: Successful ultrasound-guided paracentesis yielding 5.1 liters of peritoneal fluid. Electronically Signed   By: Ulyses Southward M.D.   On: 08/10/2019 12:51   DG Chest Port 1 View  Result Date: 2019/09/24 CLINICAL DATA:  Altered mental status.  Weakness. EXAM: PORTABLE CHEST 1 VIEW COMPARISON:  July 12, 2019 FINDINGS: There is airspace consolidation in the left lower lung region with small left pleural effusion. Lungs elsewhere are clear. Heart is upper normal in size with pulmonary vascularity normal. No adenopathy. No focal bone lesions. IMPRESSION: Airspace consolidation left lower lobe consistent with a degree of pneumonia. Small left pleural effusion. Lungs elsewhere clear. Stable cardiac silhouette. No adenopathy evident. Electronically Signed   By: Bretta Bang III M.D.   On: Sep 24, 2019 12:30    Microbiology Recent Results (from the past 240 hour(s))  Culture, body fluid-bottle     Status: None (Preliminary result)   Collection Time: September 24, 2019  3:42 PM   Specimen: Ascitic  Result Value Ref Range Status   Specimen Description ASCITIC  Final   Special Requests 10CC  Final   Culture   Final    NO GROWTH 3 DAYS Performed at [facility], [address], [place], [place] [address]    Report Status PENDING  Incomplete  Gram stain     Status: None   Collection Time: Sep 24, 2019  3:42 PM    Specimen: Ascitic  Result Value Ref Range Status   Specimen Description ASCITIC  Final   Special Requests NONE  Final   Gram Stain   Final    NO ORGANISMS SEEN CYTOSPIN SMEAR WBC PRESENT, PREDOMINANTLY MONONUCLEAR Performed at [facility], [address] [place], [place] [address]    Report Status 09-24-2019 FINAL  Final  Respiratory Panel by RT PCR (Flu A&B, Covid) - Nasopharyngeal Swab     Status: None   Collection Time: 2019/09/24  4:11 PM   Specimen: Nasopharyngeal Swab  Result Value Ref Range Status   SARS Coronavirus 2 by RT PCR NEGATIVE NEGATIVE Final    Comment: (NOTE) SARS-CoV-2 target nucleic acids are NOT DETECTED. The SARS-CoV-2 RNA is generally detectable in upper respiratoy specimens during the acute phase of infection. The lowest concentration of SARS-CoV-2 viral copies this assay can detect is 131 copies/mL. A negative result does not preclude SARS-Cov-2 infection and should not be used as the sole basis for treatment or other patient management decisions. A negative result may occur with  improper specimen collection/handling, submission of specimen other than nasopharyngeal swab, presence of viral mutation(s) within the areas targeted by this assay, and inadequate number of viral copies (<131 copies/mL). A negative result must be combined with clinical observations, patient history, and epidemiological information. The expected result is Negative. Fact Sheet for Patients:  https://www.moore.com/ Fact Sheet for Healthcare Providers:  https://www.young.biz/ This test is not yet ap proved or cleared by the [place] FDA and  has been authorized for detection and/or diagnosis of SARS-CoV-2 by FDA under an Emergency Use Authorization (EUA). This EUA will remain  in effect (meaning this  test can be used) for the duration of the COVID-19 declaration under Section 564(b)(1) of the Act, 21 U.S.C. section 360bbb-3(b)(1), unless  the authorization is terminated or revoked sooner.    Influenza A by PCR NEGATIVE NEGATIVE Final   Influenza B by PCR NEGATIVE NEGATIVE Final    Comment: (NOTE) The Xpert Xpress SARS-CoV-2/FLU/RSV assay is intended as an aid in  the diagnosis of influenza from Nasopharyngeal swab specimens and  should not be used as a sole basis for treatment. Nasal washings and  aspirates are unacceptable for Xpert Xpress SARS-CoV-2/FLU/RSV  testing. Fact Sheet for Patients: https://www.moore.com/ Fact Sheet for Healthcare Providers: https://www.young.biz/ This test is not yet approved or cleared by the [place] FDA and  has been authorized for detection and/or diagnosis of SARS-CoV-2 by  FDA under an Emergency Use Authorization (EUA). This EUA will remain  in effect (meaning this test can be used) for the duration of the  Covid-19 declaration under Section 564(b)(1) of the Act, 21  U.S.C. section 360bbb-3(b)(1), unless the authorization is  terminated or revoked. Performed at [facility], [address] [place], [place] [address]     Lab Basic Metabolic Panel: Recent Labs  Lab 10-04-19 1318 10/04/2019 1443 09/07/19 0539  NA 129*  --  132*  K 6.6* 6.0* 5.3*  CL 95*  --  94*  CO2 19*  --  21*  GLUCOSE 213*  --  275*  BUN 72*  --  79*  CREATININE 2.05*  --  1.83*  CALCIUM 8.6*  --  8.8*   Liver Function Tests: Recent Labs  Lab 04-Oct-2019 1318 09/07/19 0539  AST 37 30  ALT 13 13  ALKPHOS 126 105  BILITOT 1.2 1.3*  PROT 6.3* 5.9*  ALBUMIN 2.2* 2.1*   Recent Labs  Lab 10-04-19 1318  LIPASE 20   Recent Labs  Lab 10/04/19 1442 09/07/19 0539  AMMONIA 109* 104*   CBC: Recent Labs  Lab 2019/10/04 1231 09/07/19 0855  WBC 10.0 6.3  NEUTROABS 8.0*  --   HGB 12.0 11.9*  HCT 38.6 37.4  MCV 86.7 86.2  PLT 186 169   Cardiac Enzymes: No results for input(s): CKTOTAL, CKMB, CKMBINDEX, TROPONINI in the last 168 hours. Sepsis  Labs: Recent Labs  Lab 04-Oct-2019 1231 09/07/19 0855  WBC 10.0 6.3    Procedures/Operations     Kaitlin Ardito 09/09/2019, 7:54 AM

## 2019-10-09 DEATH — deceased

## 2019-12-11 IMAGING — US US HEPATIC LIVER DOPPLER
1 series · 9 of 9 positions shown · non-contrast
Comparison: CT 08/11/2018

CLINICAL DATA: Cirrhosis

EXAM:
COMPLETE ABDOMINAL ULTRASOUND
US HEPATIC DOPPLER ARTERIAL/VENOUS ULTRASOUND

[Series 1: us hepatic liver doppler · 9 of 9 slices shown]
[im 1/9]
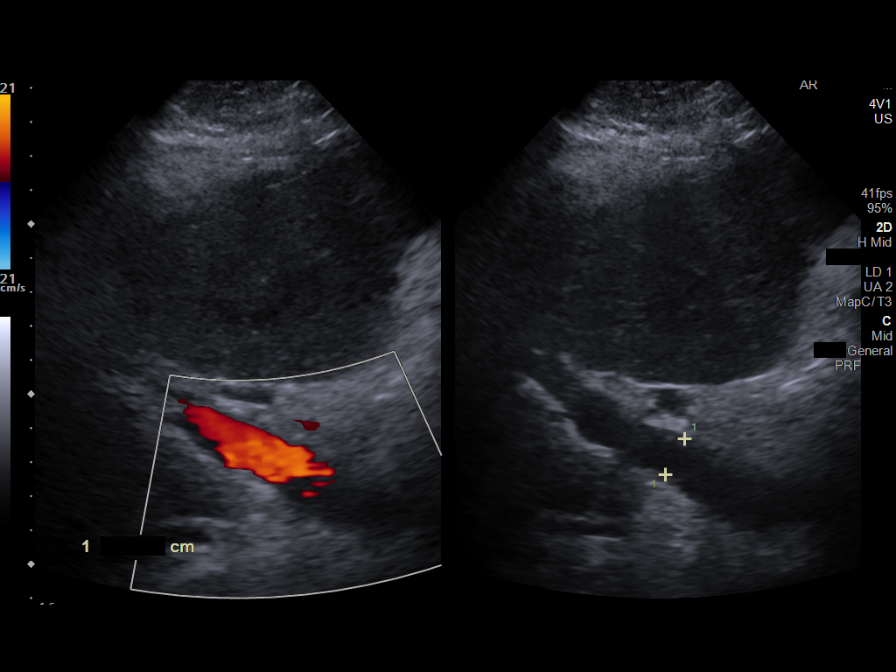
[im 2/9]
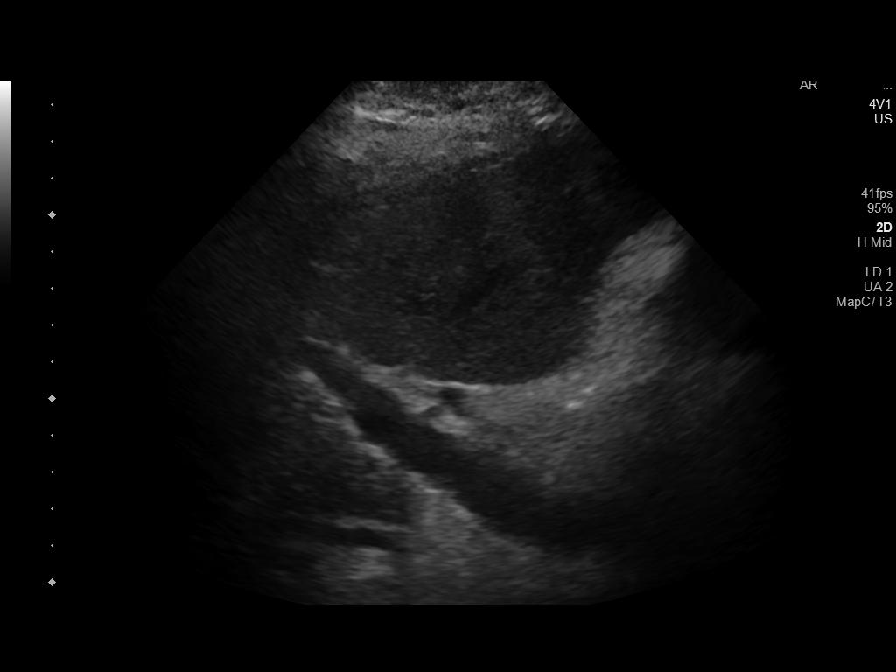
[im 3/9]
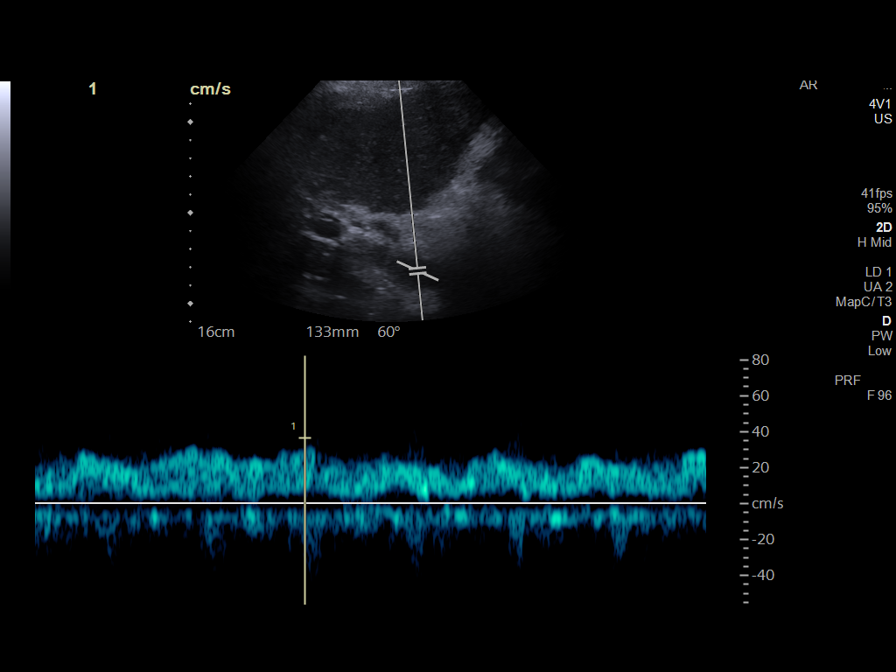
[im 4/9]
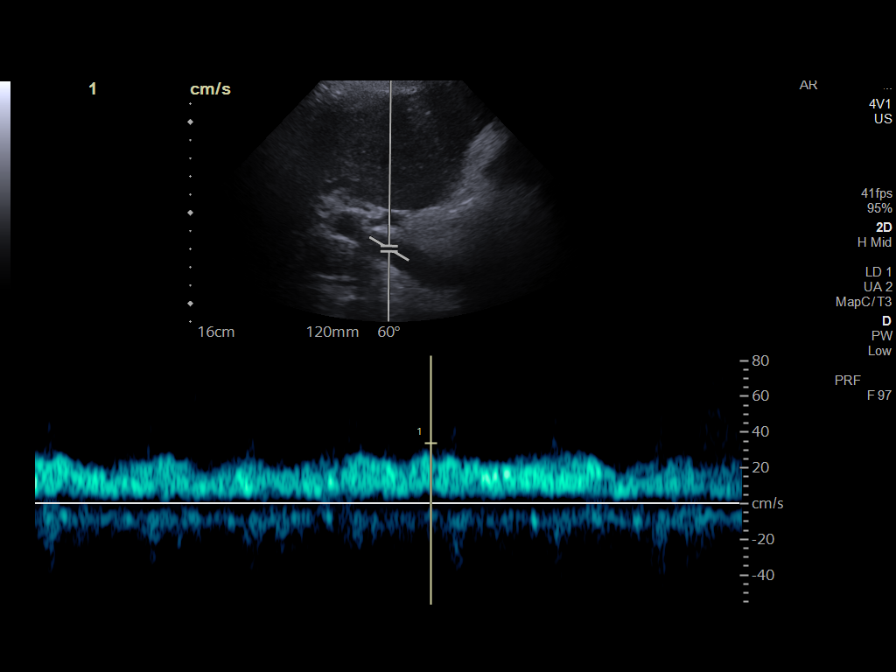
[im 5/9]
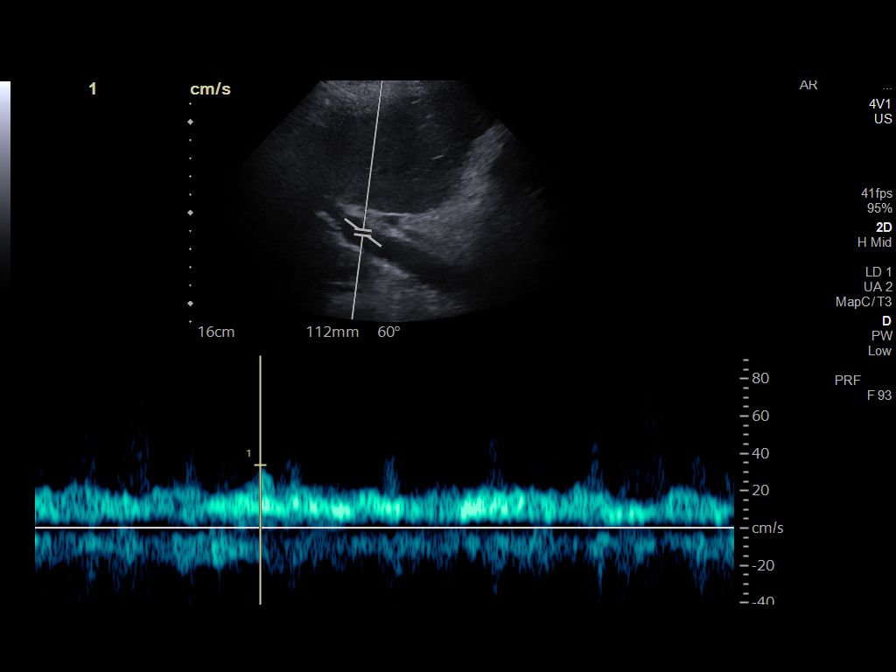
[im 6/9]
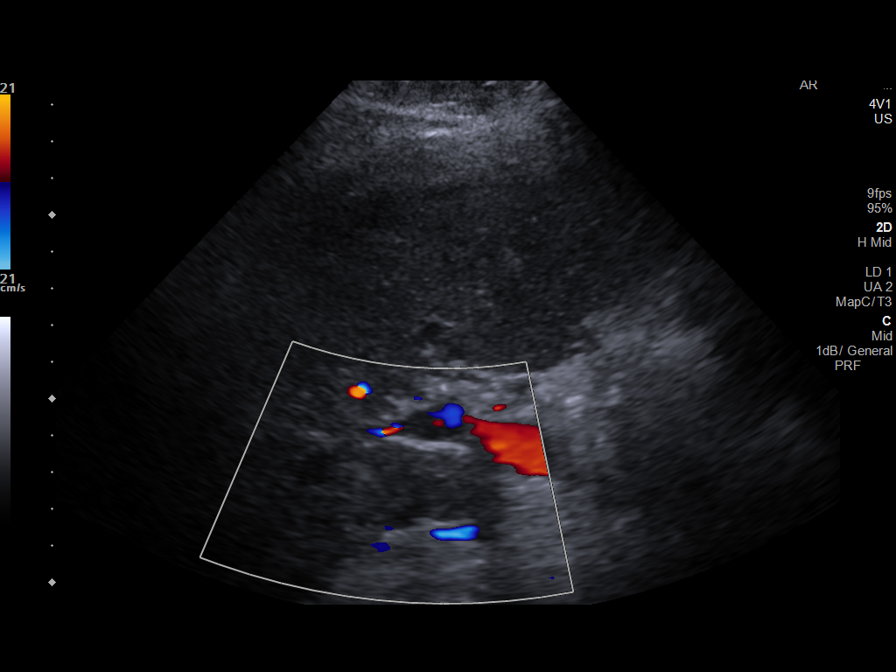
[im 7/9]
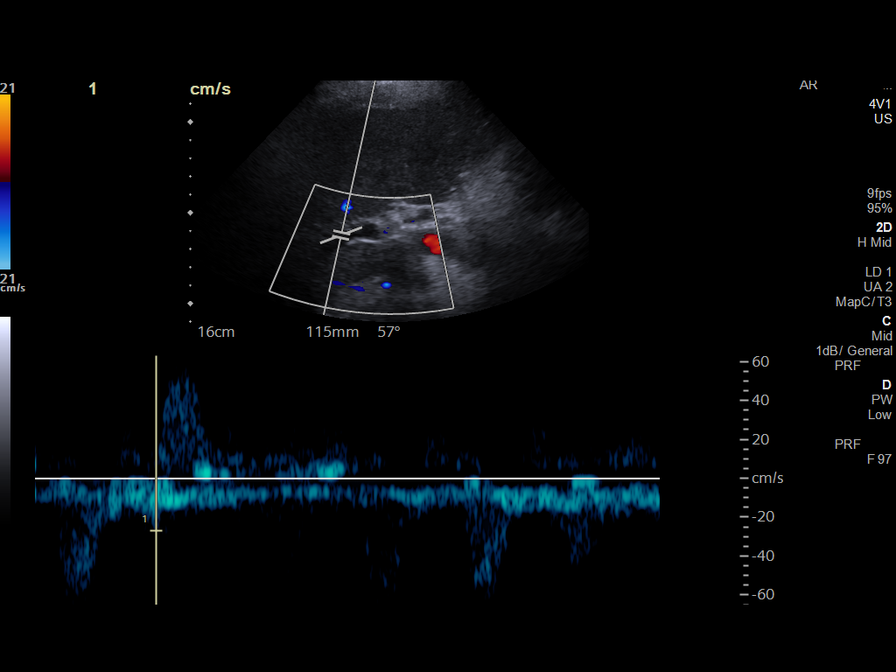
[im 8/9]
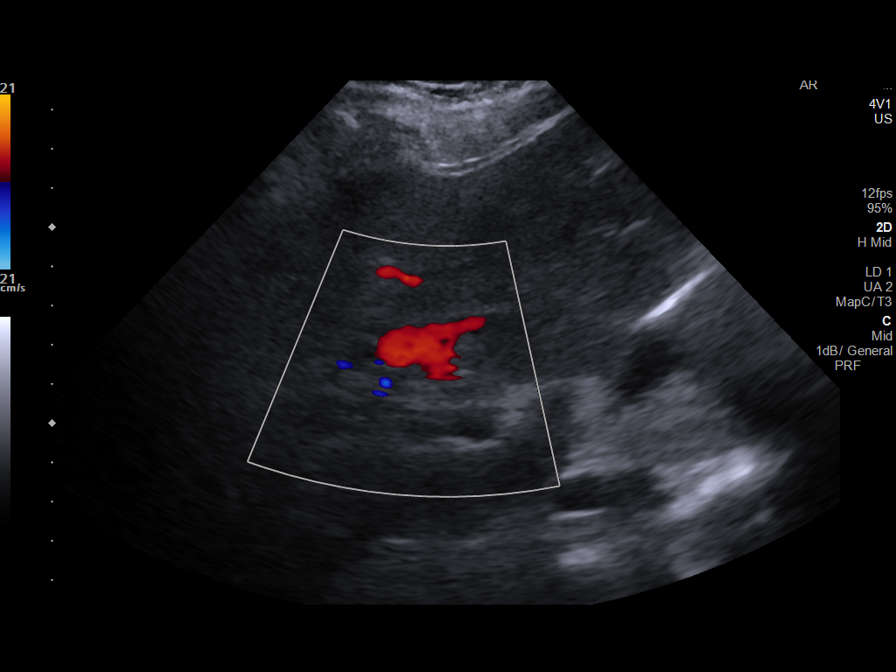
[im 9/9]
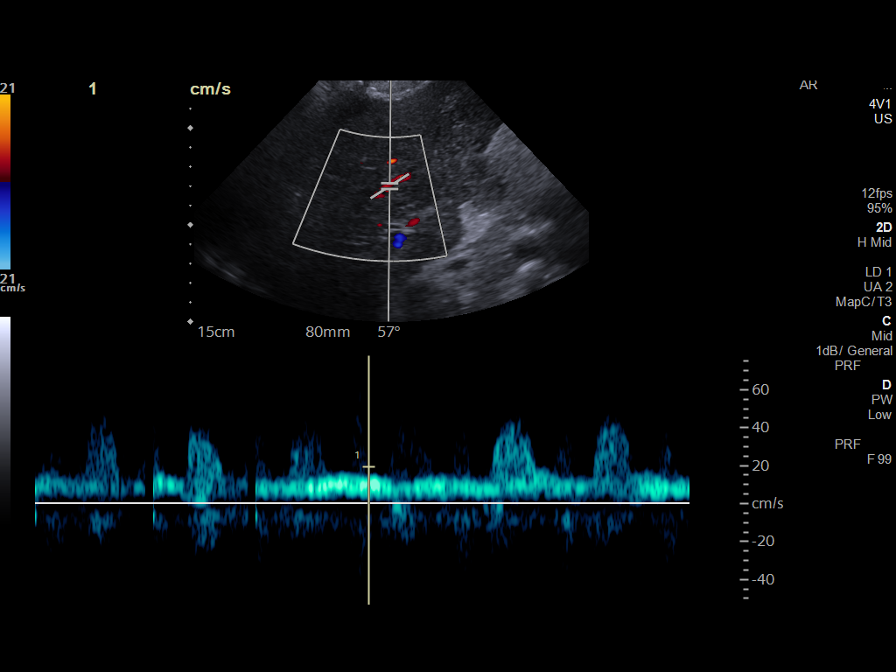

[9 of 9 positions shown; findings below may reference images not displayed]

FINDINGS: Gallbladder:  Surgically absent

Common bile duct:  Normal in caliber, 6mm diameter.

Liver: Coarse heterogenous echotexture. No focal lesion or
intrahepatic biliary ductal dilatation.

Portal Vein 1.2 cm  diameter.  Velocities (all hepatopetal):

Main:  34-37 cm/sec

Right: 27 cm/sec

Left:  19 cm/sec

IVC:  Negative

Pancreas: Visualized segments unremarkable, portions obscured by
overlying bowel gas.

Spleen:  Enlarged craniocaudal 14.7cm in length.

Right Kidney:  No mass or hydronephrosis, 13.3cm in length.

Left Kidney:  No lesion or hydronephrosis, 10.1cm in length.

Abdominal aorta: Visualized segments unremarkable, portions obscured
by overlying bowel gas.

Varices: None identified

Ascites: Moderate
IMPRESSION: 1. Moderate abdominal ascites
2. Splenomegaly
3. Patent portal vein

## 2020-02-04 IMAGING — US US ABDOMEN LIMITED
1 series · 14 of 25 positions shown · non-contrast
Comparison: 06/23/2017

CLINICAL DATA: Cirrhosis, hepatoma screening

EXAM:
ULTRASOUND ABDOMEN LIMITED RIGHT UPPER QUADRANT

[Series 1: us abdomen limited · 0.22mm/px · 14 of 45 slices shown]
[im 1/45]
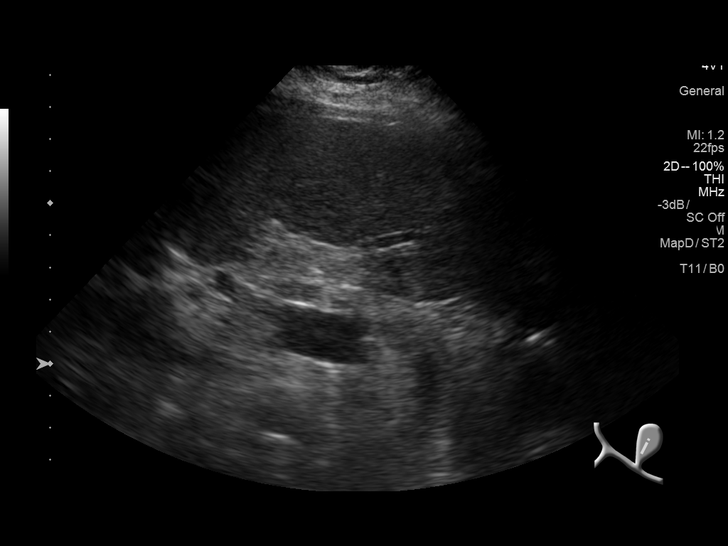
[im 4/45]
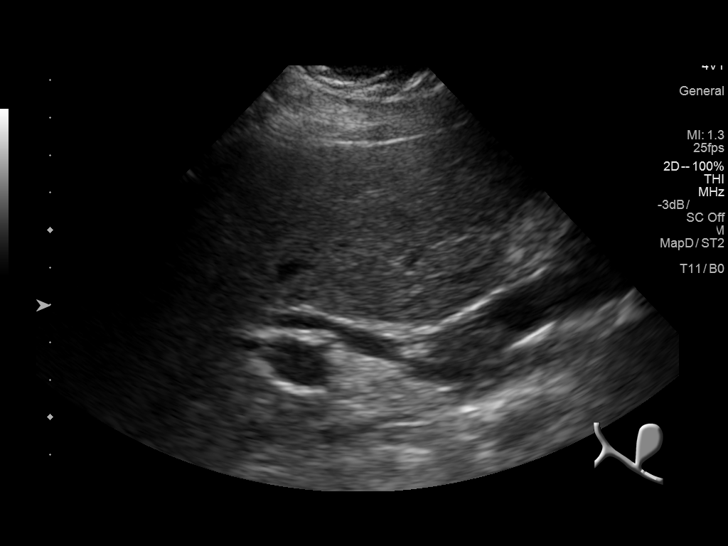
[im 8/45]
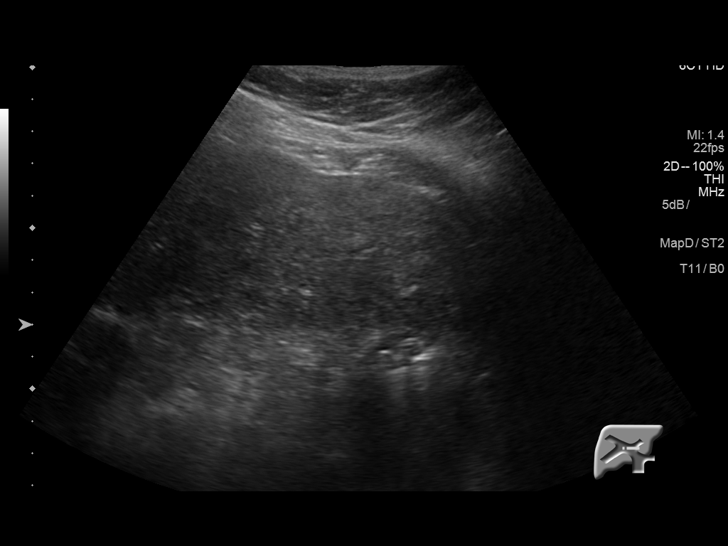
[im 12/45]
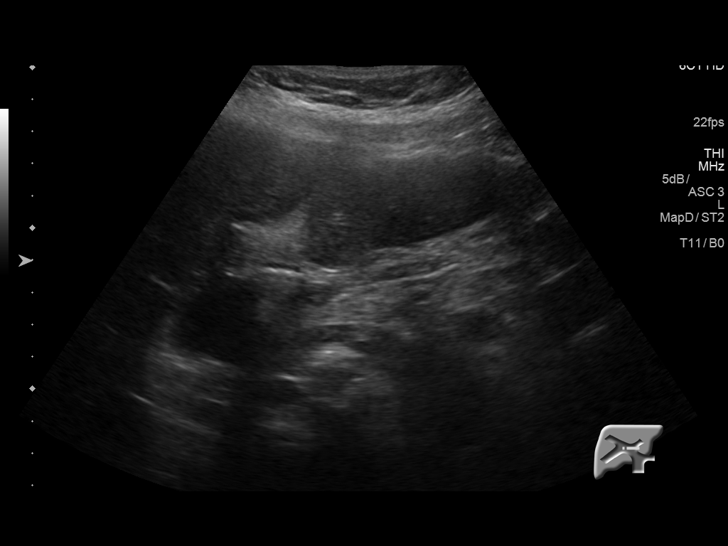
[im 15/45]
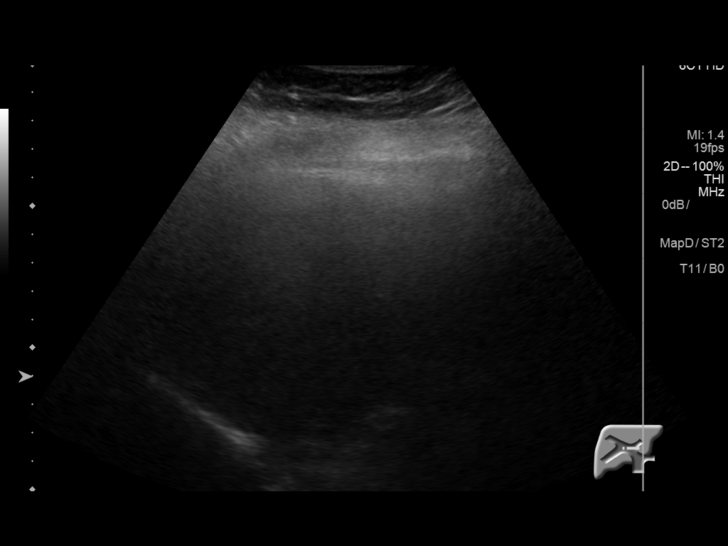
[im 17/45]
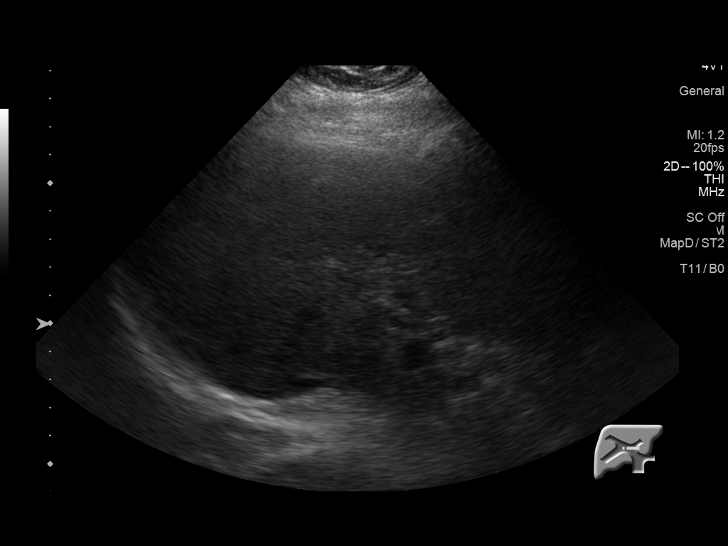
[im 21/45]
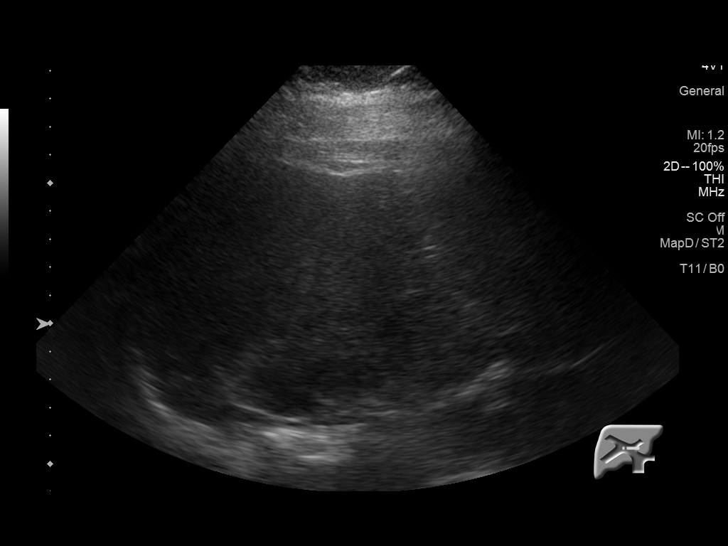
[im 24/45]
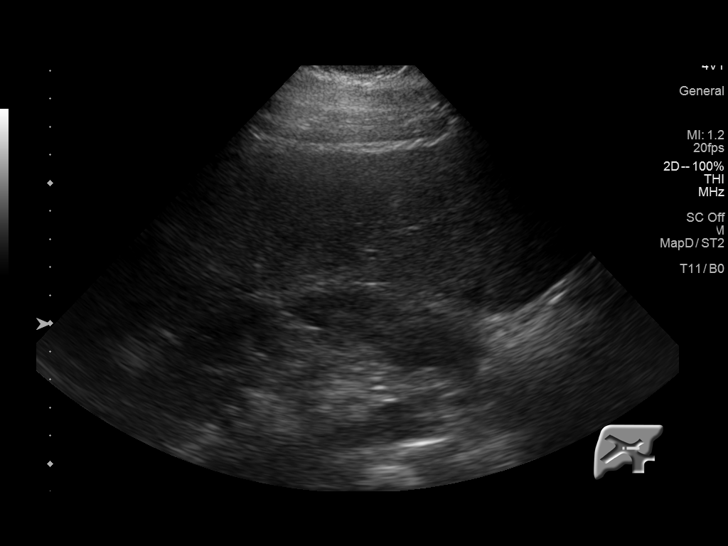
[im 28/45]
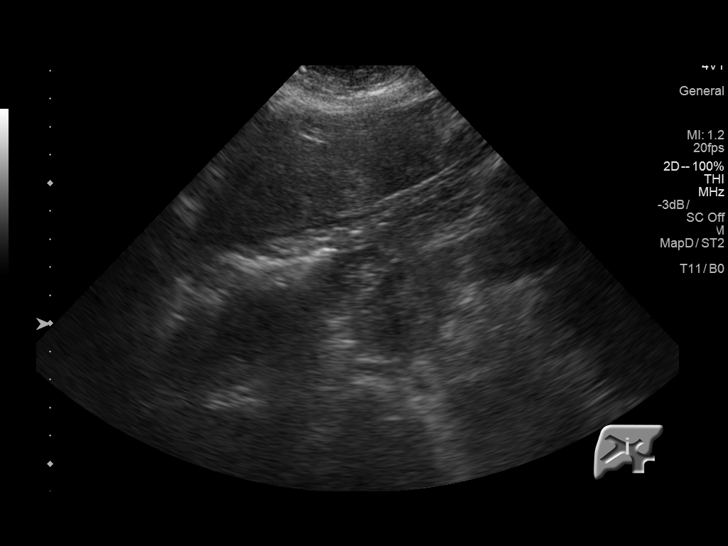
[im 30/45]
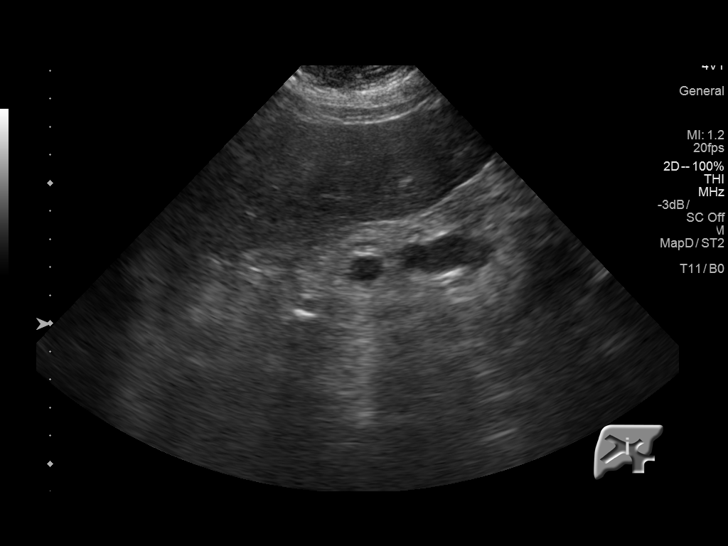
[im 34/45]
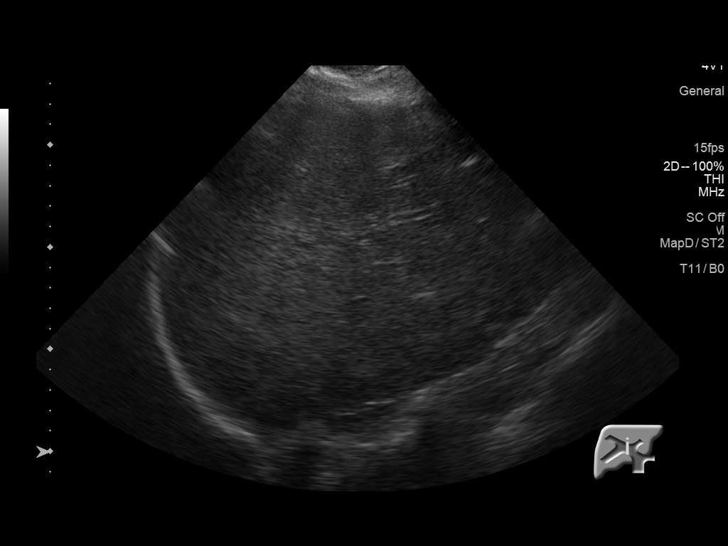
[im 37/45]
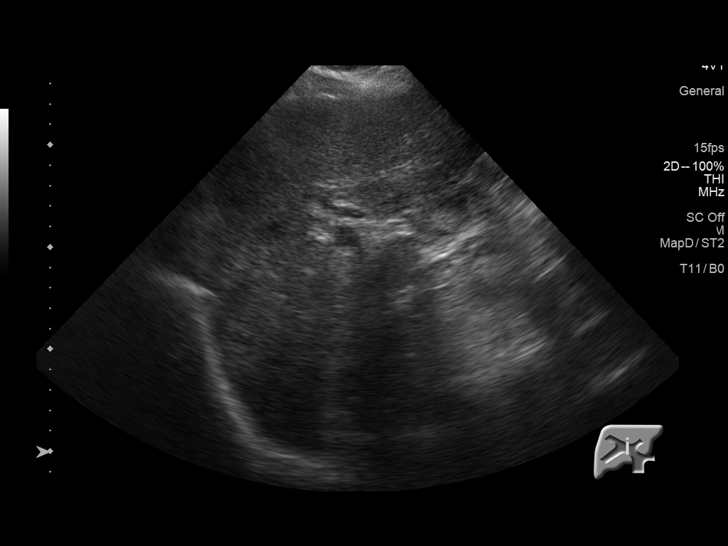
[im 41/45]
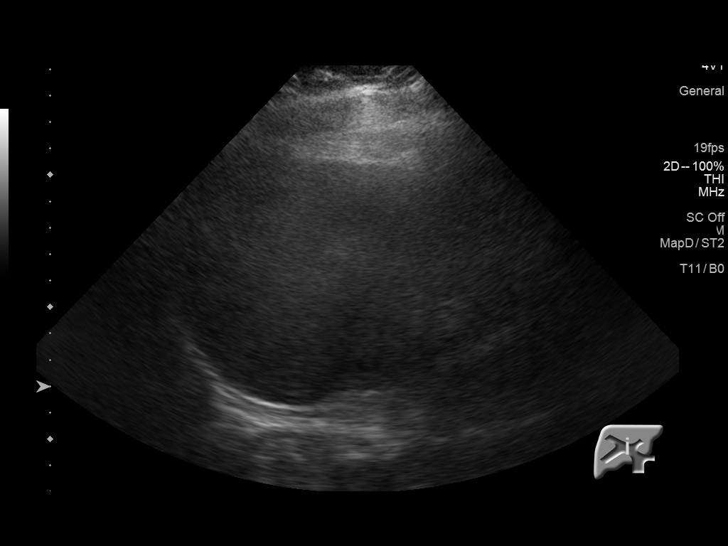
[im 45/45]
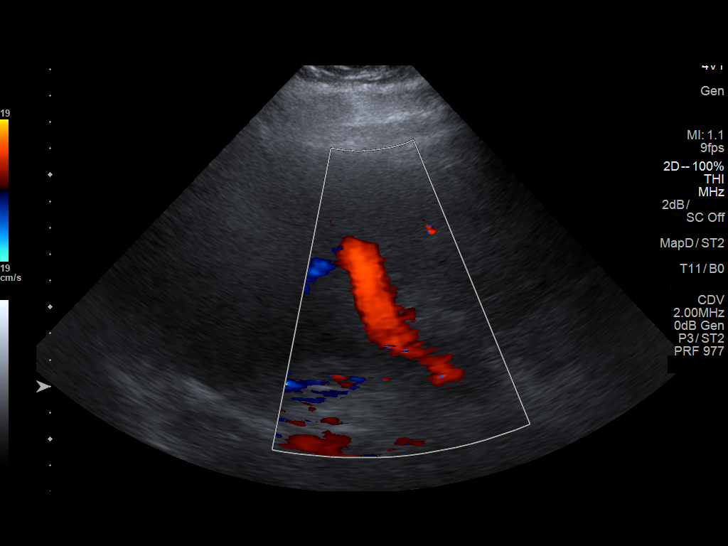

[14 of 25 positions shown; findings below may reference images not displayed]

FINDINGS: Gallbladder:

Gallbladder surgically absent

Common bile duct:

Diameter: Normal caliber 3 mm diameter

Liver:

Minimally increased hepatic echogenicity. Slightly nodular contours
consistent with cirrhosis. No definite focal hepatic mass lesion.
Portal vein is patent on color Doppler imaging with normal direction
of blood flow towards the liver.

No RIGHT upper quadrant free fluid
IMPRESSION: Cirrhotic appearing liver without focal mass.

Post cholecystectomy.

## 2020-04-13 IMAGING — US US RENAL
1 series · 14 of 25 positions shown · non-contrast
Comparison: 08/11/2018

CLINICAL DATA: Acute on chronic renal failure

EXAM:
RENAL / URINARY TRACT ULTRASOUND COMPLETE

[Series 1: us renal · 14 of 33 slices shown]
[im 1/33]
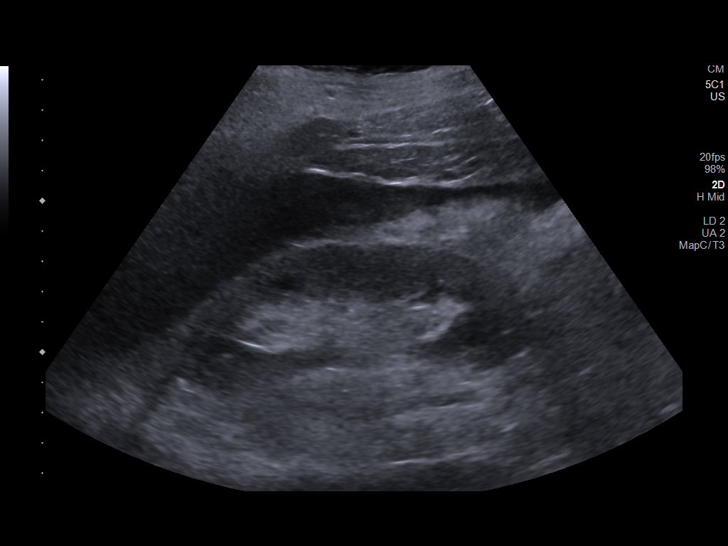
[im 3/33]
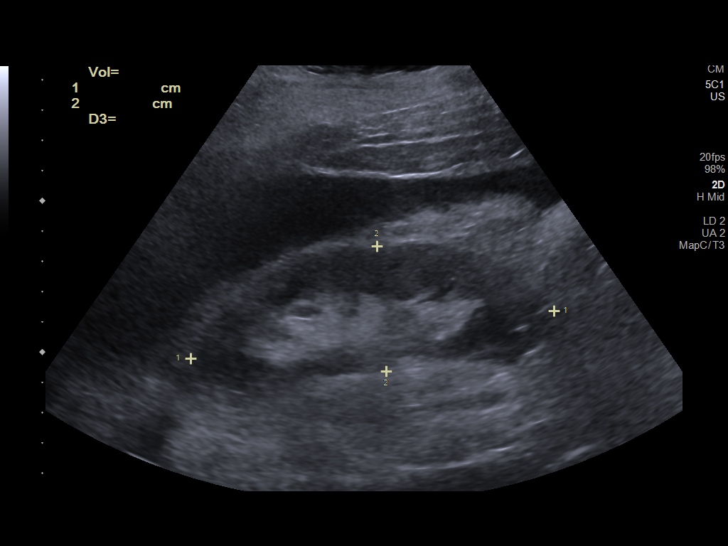
[im 6/33]
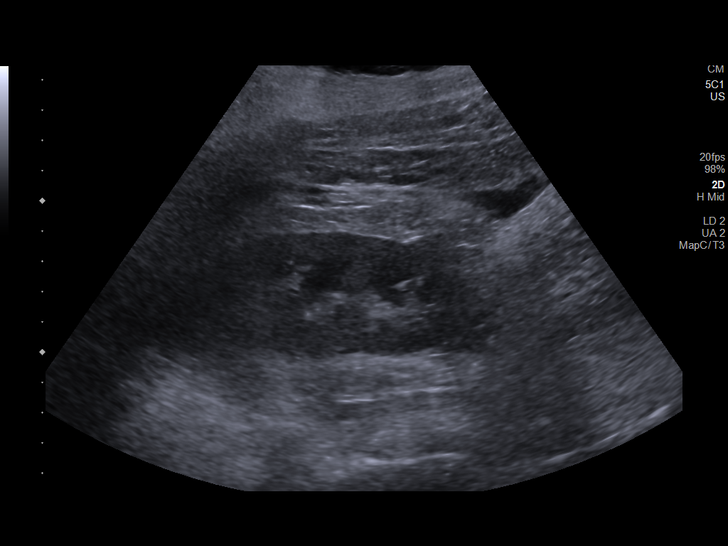
[im 9/33]
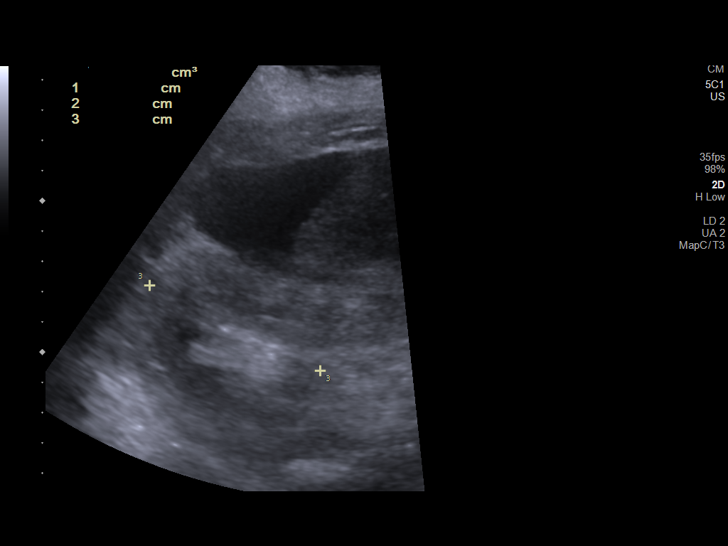
[im 11/33]
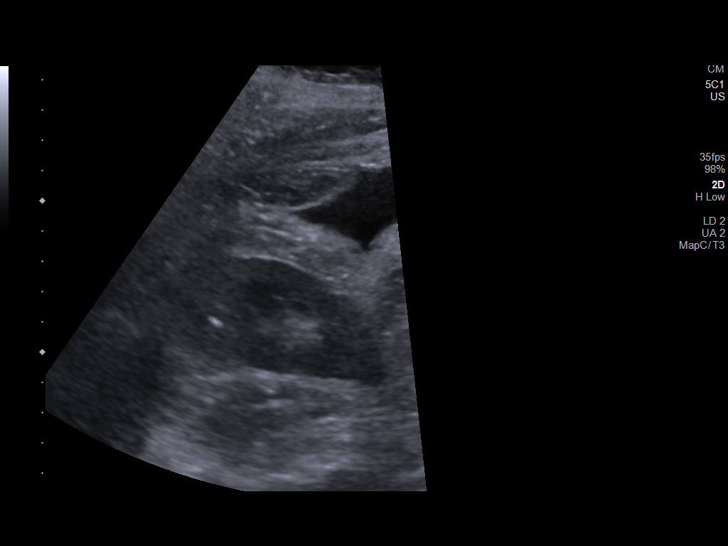
[im 13/33]
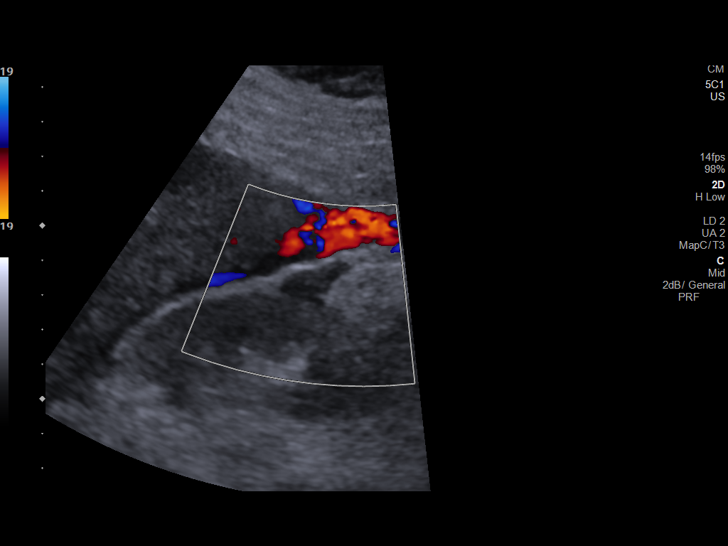
[im 15/33]
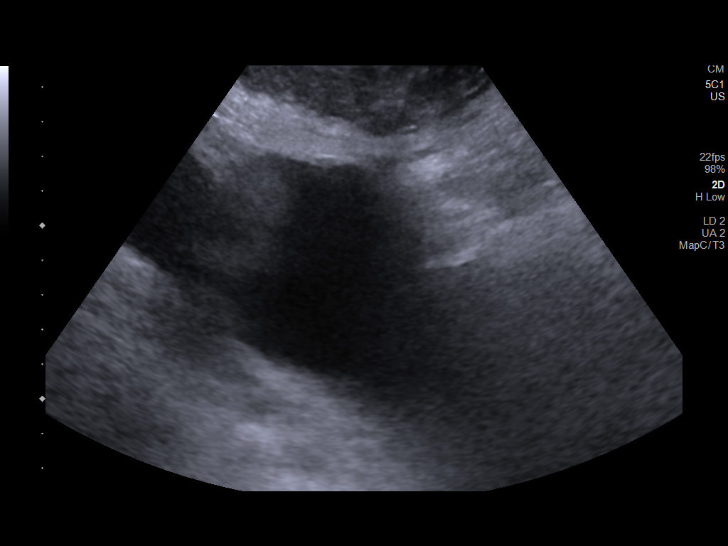
[im 18/33]
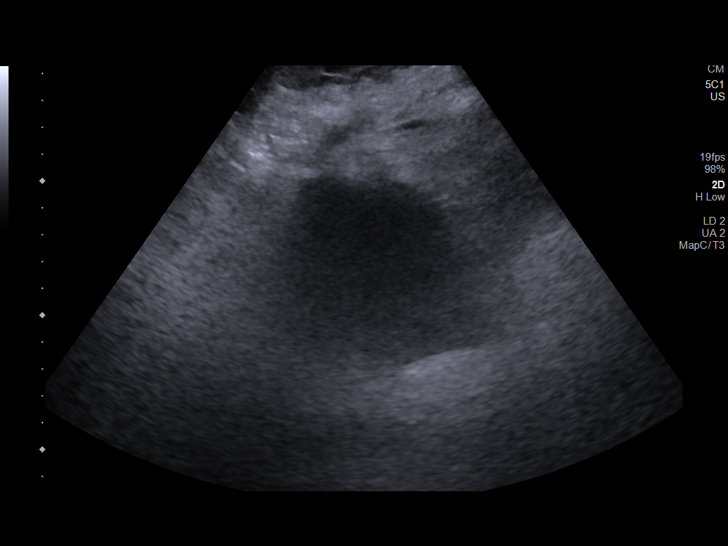
[im 21/33]
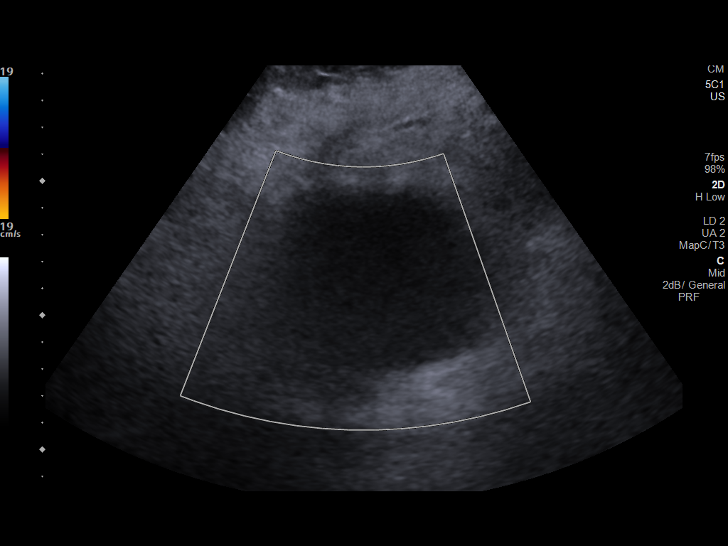
[im 22/33]
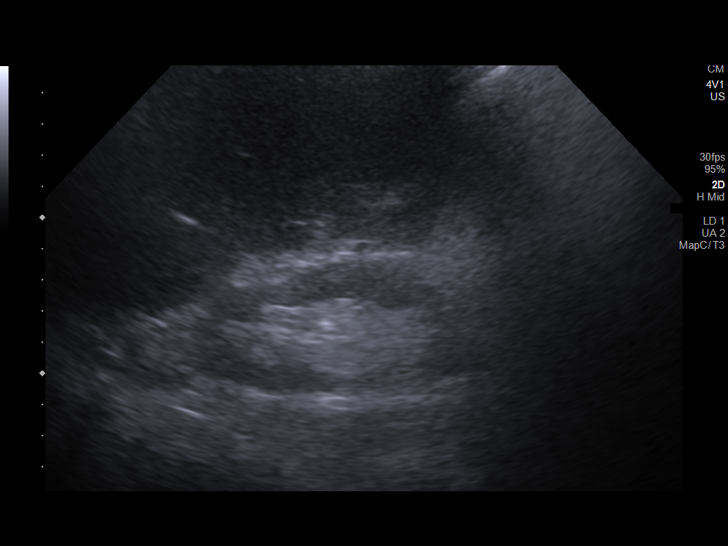
[im 25/33]
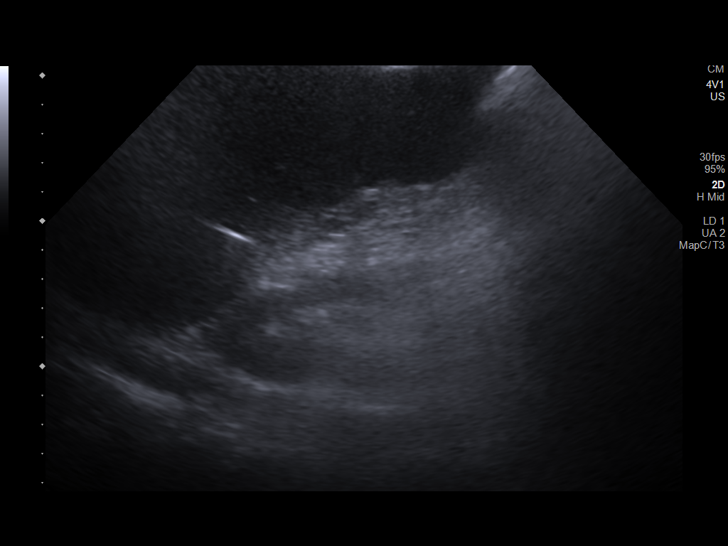
[im 27/33]
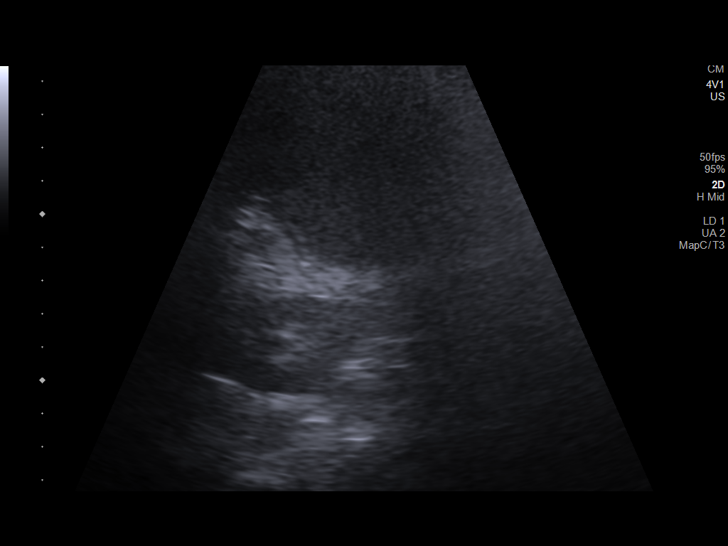
[im 30/33]
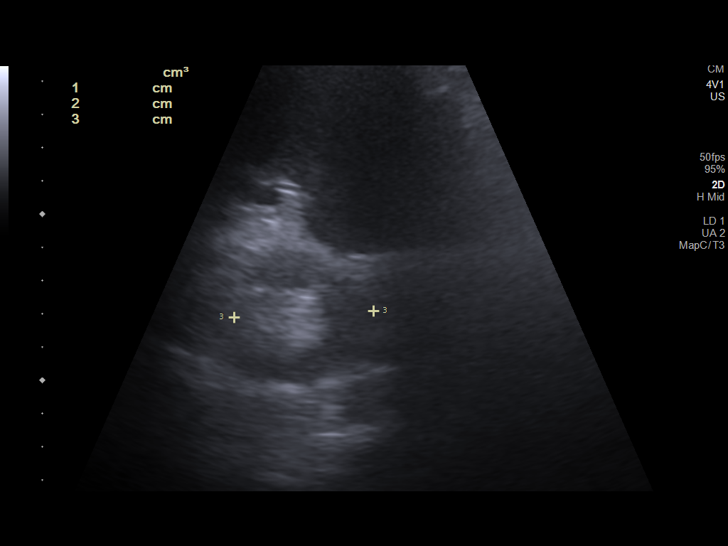
[im 33/33]
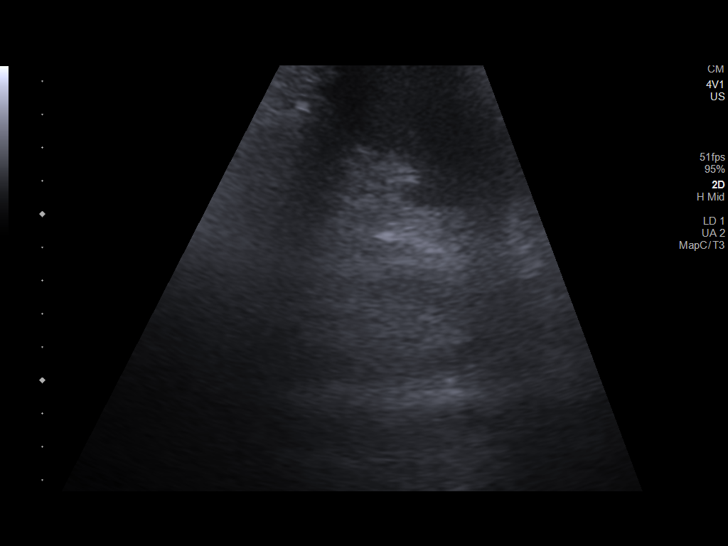

[14 of 25 positions shown; findings below may reference images not displayed]

FINDINGS: Right Kidney:

Renal measurements: 12.1 x 4.2 x 6.3 cm = volume: 166 mL .
Echogenicity within normal limits. No mass or hydronephrosis
visualized.

Left Kidney:

Renal measurements: 9.9 x 4.6 x 4.2 cm = volume: 100 mL. Examination
is somewhat limited secondary to poor acoustic windows. Echogenicity
appears within normal limits. No mass or hydronephrosis visualized.

Bladder:

Appears normal for degree of bladder distention.

Other:

Small volume free fluid within the upper abdomen.
IMPRESSION: 1. Negative for obstructive uropathy.
2. Small volume residual free fluid within the upper abdomen.

## 2020-04-22 IMAGING — DX DG CHEST 1V PORT
1 series · 1 of 1 positions shown · non-contrast
Comparison: 06/13/2019

CLINICAL DATA: Shortness of breath

EXAM:
PORTABLE CHEST 1 VIEW

[chest ap grid]
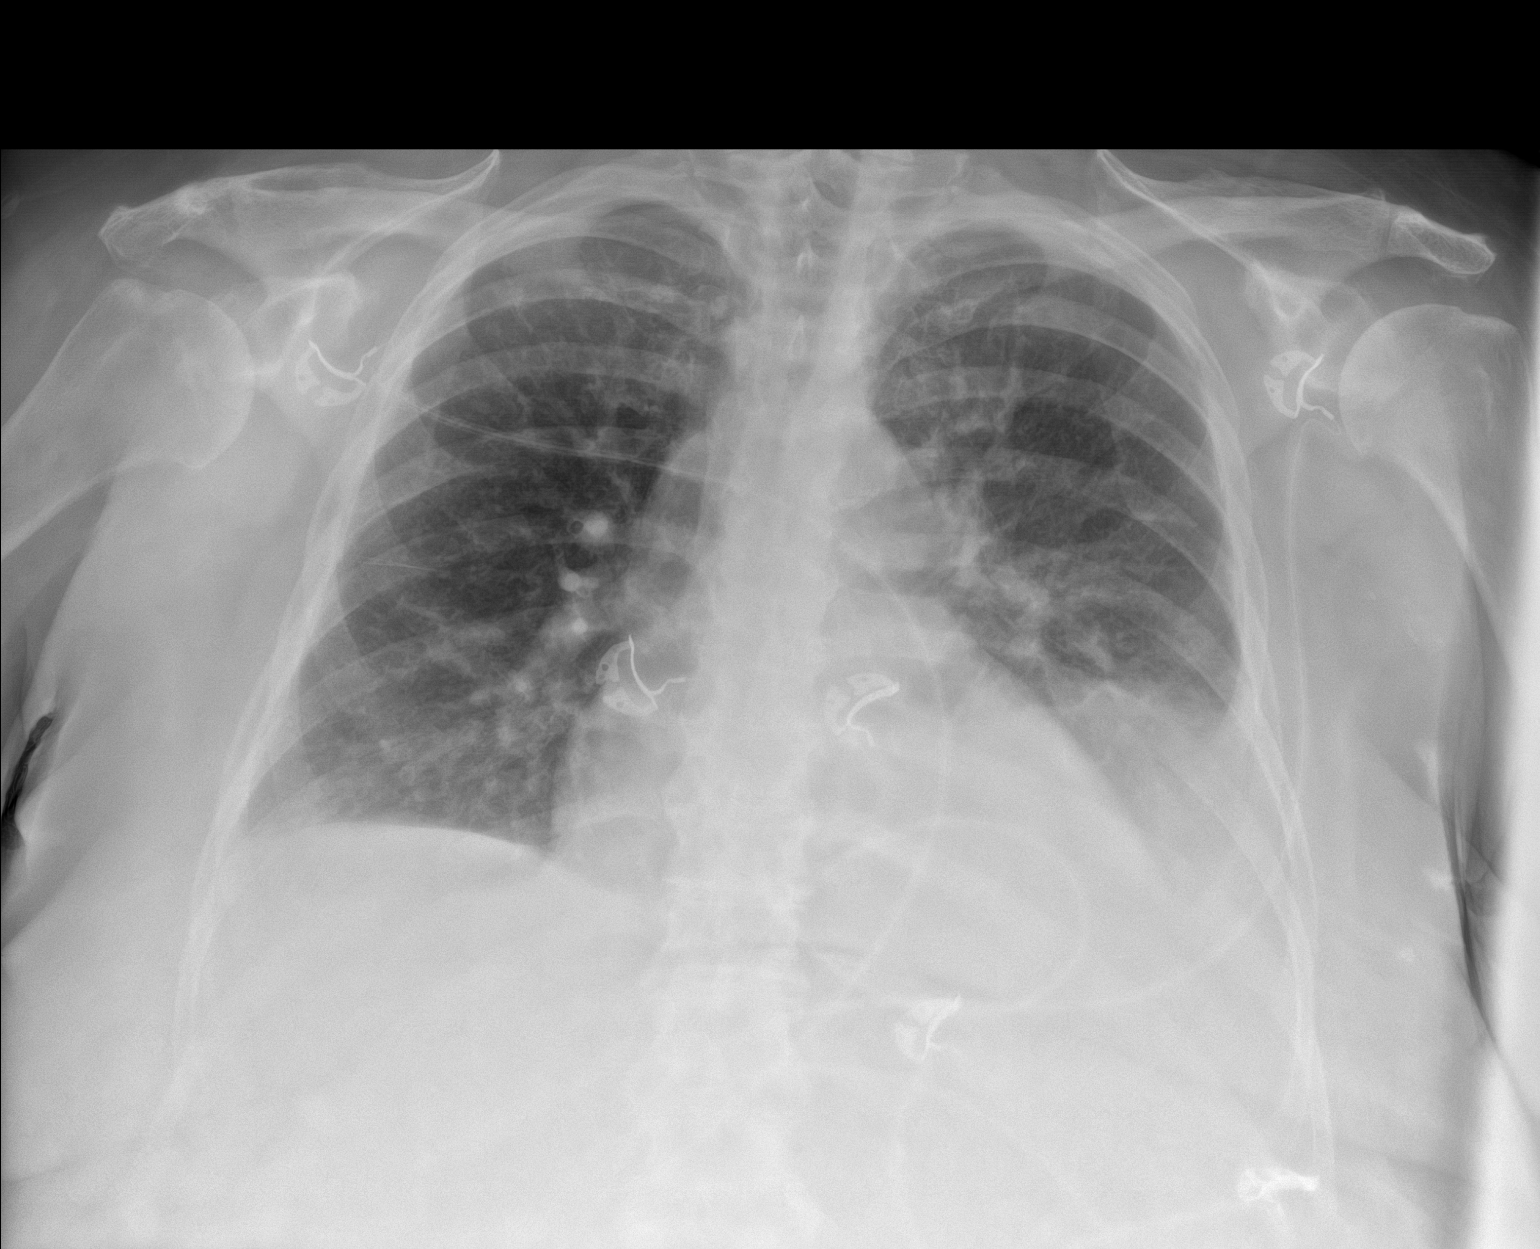

[1 of 1 positions shown; findings below may reference images not displayed]

FINDINGS: Cardiomegaly. Left pleural effusion with left lower lobe atelectasis
or infiltrate. No confluent opacity on the right. No edema. No acute
bony abnormality.
IMPRESSION: Left pleural effusion with left lower lobe atelectasis or
infiltrate/pneumonia.
